# Patient Record
Sex: Male | Born: 1965 | Race: White | Hispanic: No | Marital: Married | Smoking: Former smoker
Health system: Southern US, Community
[De-identification: ages and names within clinical notes are randomized; demographics above are authoritative.]

## PROBLEM LIST (undated history)

## (undated) DIAGNOSIS — C801 Malignant (primary) neoplasm, unspecified: Secondary | ICD-10-CM

## (undated) DIAGNOSIS — E785 Hyperlipidemia, unspecified: Secondary | ICD-10-CM

## (undated) HISTORY — DX: Hyperlipidemia, unspecified: E78.5

## (undated) HISTORY — PX: HERNIA REPAIR: SHX51

---

## 1998-09-12 ENCOUNTER — Ambulatory Visit (HOSPITAL_COMMUNITY): Admission: RE | Admit: 1998-09-12 | Discharge: 1998-09-12 | Payer: Self-pay | Admitting: *Deleted

## 2015-03-09 ENCOUNTER — Other Ambulatory Visit: Payer: Self-pay | Admitting: General Surgery

## 2015-03-09 DIAGNOSIS — R1900 Intra-abdominal and pelvic swelling, mass and lump, unspecified site: Secondary | ICD-10-CM

## 2015-03-13 ENCOUNTER — Ambulatory Visit
Admission: RE | Admit: 2015-03-13 | Discharge: 2015-03-13 | Disposition: A | Discharge status: Home / Self Care | Payer: BLUE CROSS/BLUE SHIELD | Source: Ambulatory Visit | Attending: General Surgery | Admitting: General Surgery

## 2015-03-13 IMAGING — CT CT ABD-PELV W/ CM
3 of 5 series · 13 of 36 positions shown, 19 images · IV contrast (READICAT/WATER & [ID] ISOVUE 300)
Comparison: None.

CLINICAL DATA: Left-sided abdominal bulge with discomfort. Status
post previous right inguinal hernia repair.

EXAM:
CT ABDOMEN AND PELVIS WITH CONTRAST
TECHNIQUE: Multidetector CT imaging of the abdomen and pelvis was performed
using the standard protocol following bolus administration of
intravenous contrast.
CONTRAST:  100mL [R2] IOPAMIDOL ([R2]) INJECTION 61%

[Series 3: abd/pelvis with · axial · 0.86mm/px · z∈[-423,-63]mm · 7 of 96 slices shown, 12 images]
[im 12/96  soft-tissue]
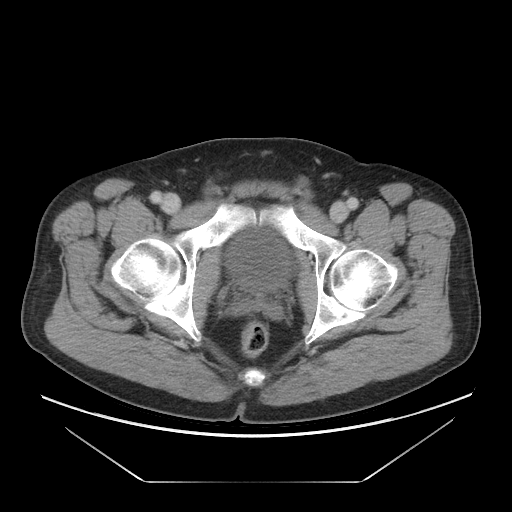
[im 12/96  bone]
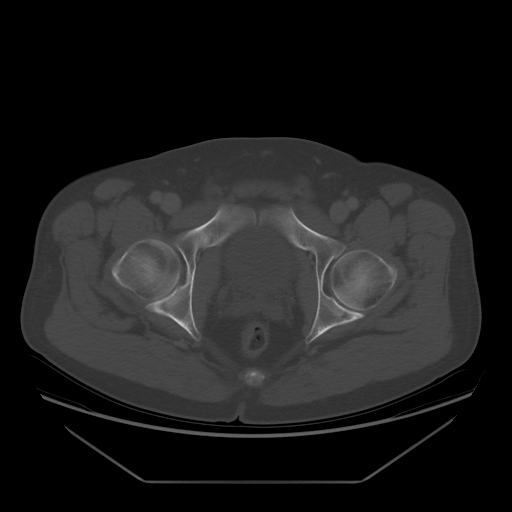
[im 24/96  soft-tissue]
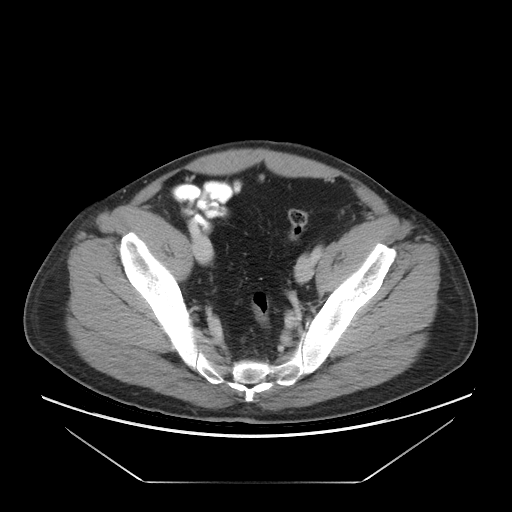
[im 36/96  soft-tissue]
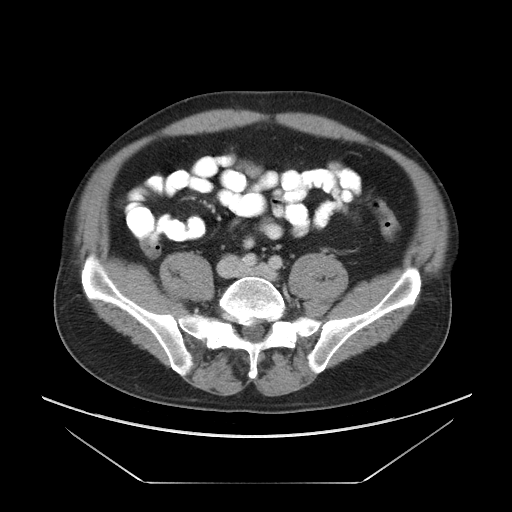
[im 48/96  soft-tissue]
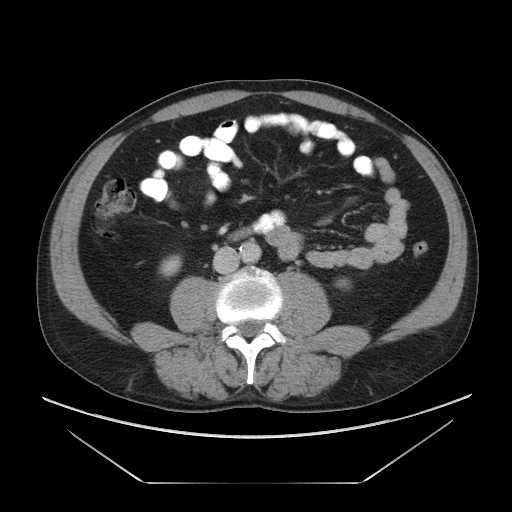
[im 48/96  lung]
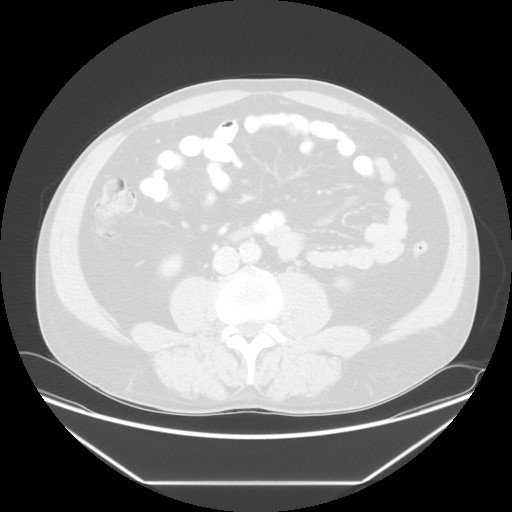
[im 60/96  soft-tissue]
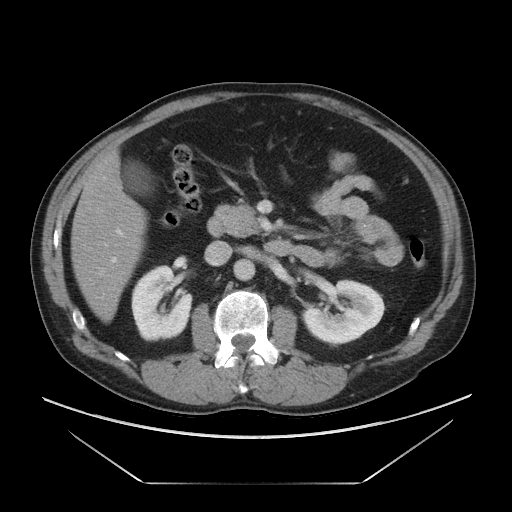
[im 60/96  lung]
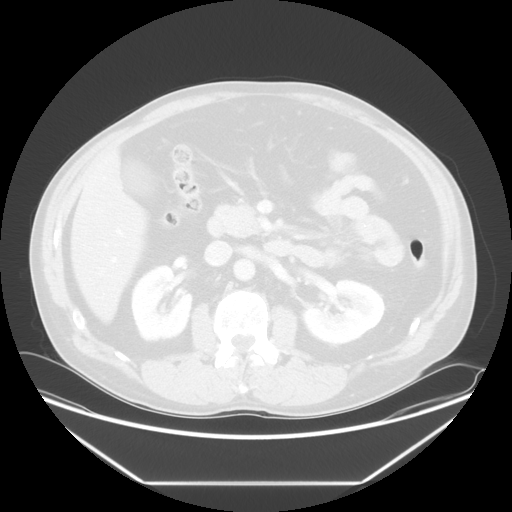
[im 72/96  soft-tissue]
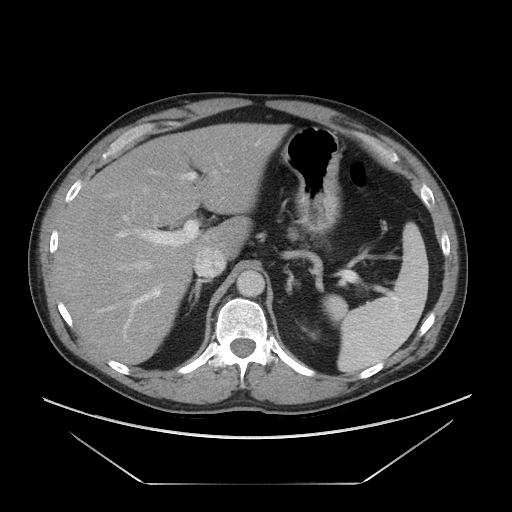
[im 72/96  lung]
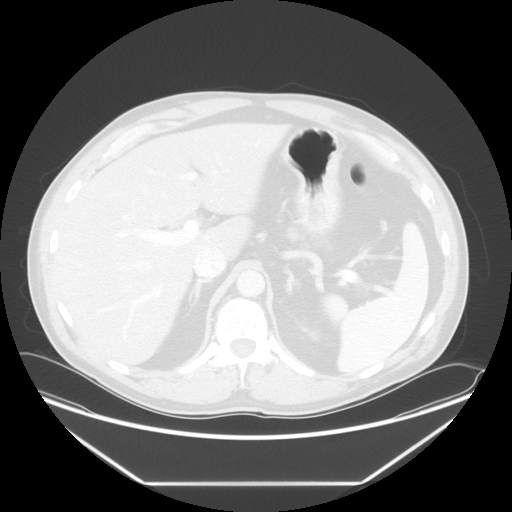
[im 84/96  soft-tissue]
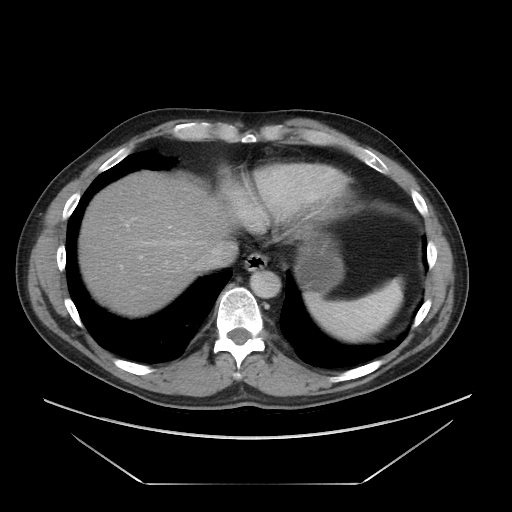
[im 84/96  lung]
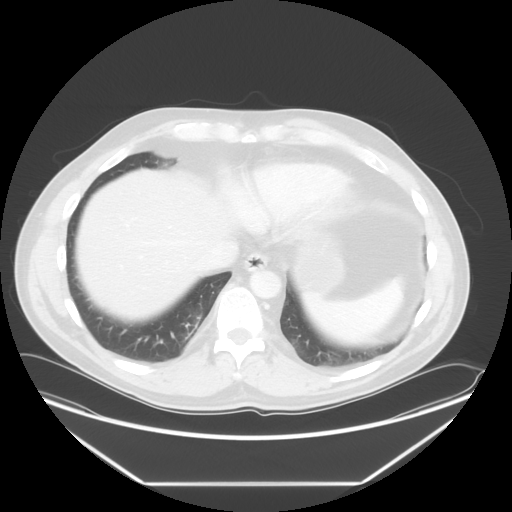

[Series 601: coronal body · coronal · 0.95mm/px · 1 of 122 slices shown, 2 images]
[im 41/122  soft-tissue]
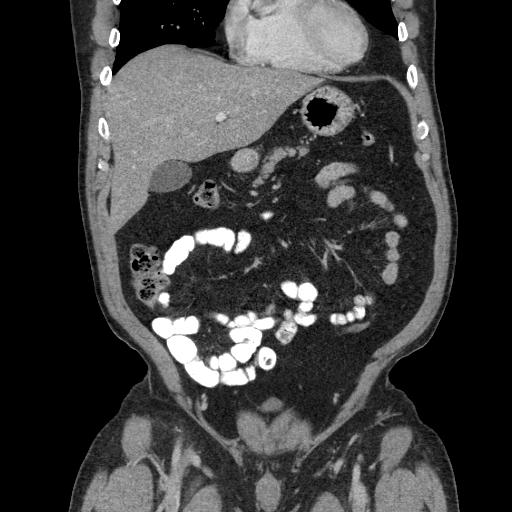
[im 41/122  bone]
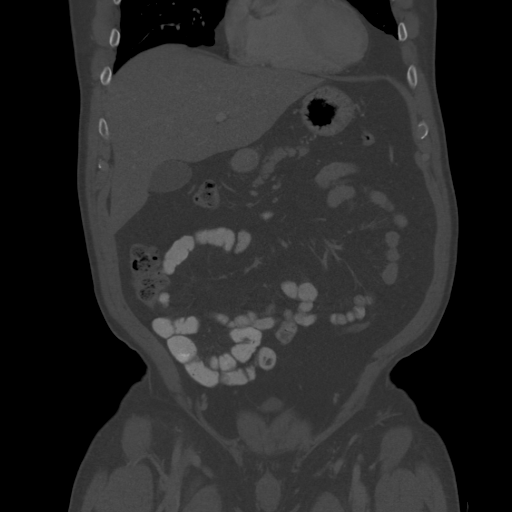

[Series 602: sagittal body · sagittal · 0.95mm/px · 5 of 166 slices shown]
[im 11/166  soft-tissue]
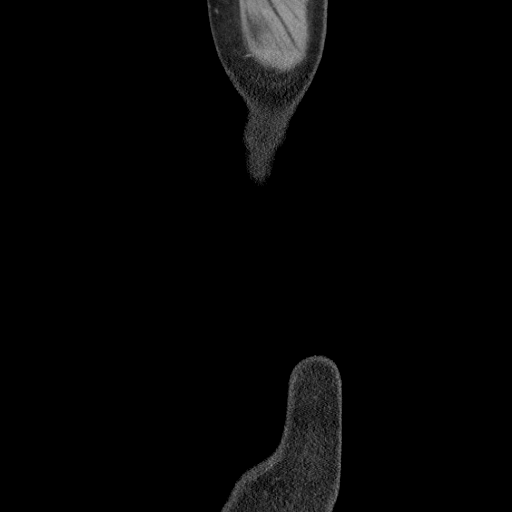
[im 31/166  soft-tissue]
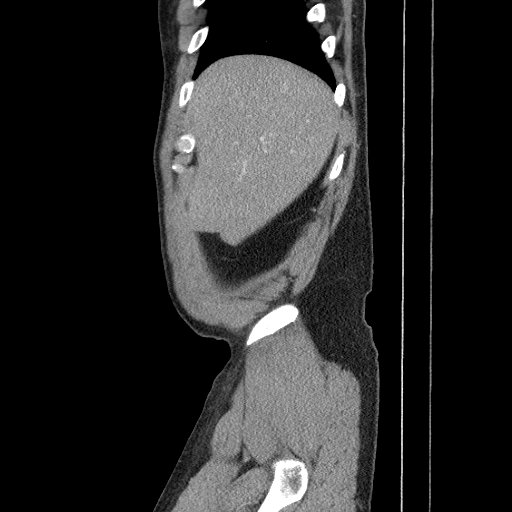
[im 52/166  soft-tissue]
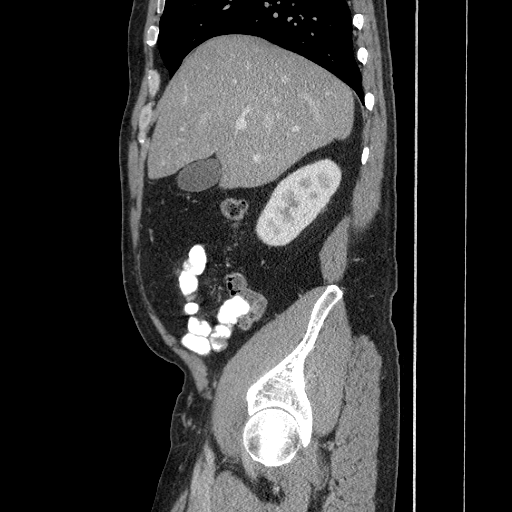
[im 73/166  soft-tissue]
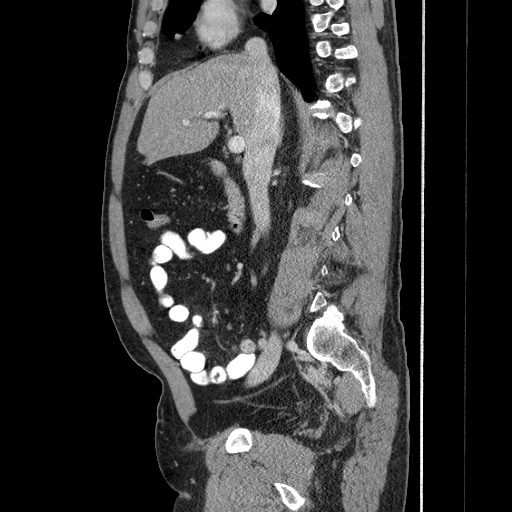
[im 93/166  soft-tissue]
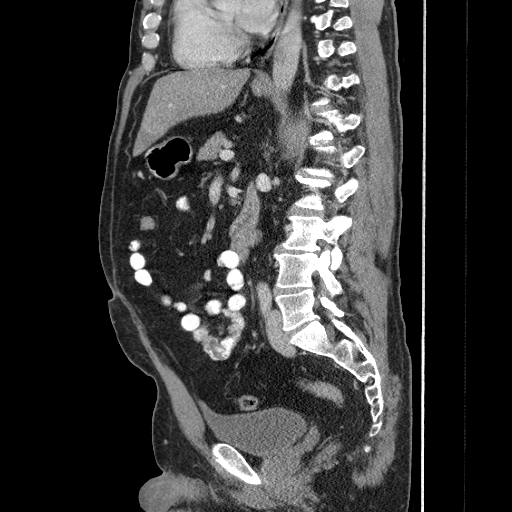

[13 of 36 positions shown; findings below may reference images not displayed]

FINDINGS: The region of abdominal bulge was marked by a vitamin E capsule by
the CT technologist. In this region, there is no evidence of mass,
hernia or inflammation. No abnormal fluid collections are
identified.

The liver shows mild and diffuse steatosis. The gallbladder,
pancreas, spleen, adrenal glands and kidneys are within normal
limits. No lymphadenopathy or focal masses are identified.

Bowel shows no evidence of obstruction or inflammation. The appendix
is well visualized and normal. No inguinal hernias. The bladder is
unremarkable. Visualized lung bases are normal. Bony structures show
moderate spondylosis at L1-2 and L4-5. Mild spondylosis present at
L3-4 L5-S1. No bony lesions or subluxations.
IMPRESSION: 1. No evidence of at abnormality by CT in the region of symptomatic
left abdominal bulge.
2. Mild hepatic steatosis.

## 2015-03-13 MED ORDER — IOPAMIDOL (ISOVUE-300) INJECTION 61%
100.0000 mL | Freq: Once | INTRAVENOUS | Status: AC | PRN
  Administered 2015-03-13: 100 mL via INTRAVENOUS

## 2016-09-14 ENCOUNTER — Ambulatory Visit (HOSPITAL_COMMUNITY)
Admission: EM | Admit: 2016-09-14 | Discharge: 2016-09-14 | Disposition: A | Discharge status: Home / Self Care | Payer: 59 | Attending: Emergency Medicine | Admitting: Emergency Medicine

## 2016-09-14 ENCOUNTER — Encounter (HOSPITAL_COMMUNITY): Payer: Self-pay | Admitting: Emergency Medicine

## 2016-09-14 DIAGNOSIS — B029 Zoster without complications: Secondary | ICD-10-CM | POA: Diagnosis not present

## 2016-09-14 MED ORDER — TRIAMCINOLONE ACETONIDE 0.1 % EX CREA: 1.0000 "application " | TOPICAL_CREAM | Freq: Two times a day (BID) | CUTANEOUS | 0 refills | Status: DC

## 2016-09-14 MED ORDER — VALACYCLOVIR HCL 1 G PO TABS: 1000.0000 mg | ORAL_TABLET | Freq: Three times a day (TID) | ORAL | 0 refills | Status: AC

## 2016-09-14 NOTE — ED Provider Notes (Signed)
[place]    CSN: [id number] Arrival date & time: 09/14/16  1759     History   Chief Complaint Chief Complaint  Patient presents with  . Rash  . URI    HPI Sean [name] is a 50 y.o. male.   HPI He is a 50 year old man here for evaluation of rash. He states symptoms started about 10-14 days ago with a sharp shooting pain in the left back and side. Over the last week, he has developed a rash in that distribution. It is painful. He also reports a few spots of poison ivy on his lower legs. He states they have dogs that sleep in the bed with them. No fevers.  History reviewed. No pertinent past medical history.  There are no active problems to display for this patient.   History reviewed. No pertinent surgical history.     Home Medications    Prior to Admission medications   Medication Sig Start Date End Date Taking? Authorizing Provider  triamcinolone cream (KENALOG) 0.1 % Apply 1 application topically 2 (two) times daily. 09/14/16   Melony Overly, MD  valACYclovir (VALTREX) 1000 MG tablet Take 1 tablet (1,000 mg total) by mouth 3 (three) times daily. 09/14/16 09/28/16  Melony Overly, MD    Family History History reviewed. No pertinent family history.  Social History Social History  Substance Use Topics  . Smoking status: Never Smoker  . Smokeless tobacco: Never Used  . Alcohol use Yes     Allergies   Patient has no known allergies.   Review of Systems Review of Systems As in history of present illness  Physical Exam Triage Vital Signs ED Triage Vitals  Enc Vitals Group     BP 09/14/16 1953 145/81     Pulse Rate 09/14/16 1953 90     Resp 09/14/16 1953 16     Temp 09/14/16 1953 97.5 F (36.4 C)     Temp Source 09/14/16 1953 Oral     SpO2 09/14/16 1953 97 %     Weight --      Height --      Head Circumference --      Peak Flow --      Pain Score 09/14/16 1951 5     Pain Loc --      Pain Edu? --      Excl. in [place]? --    No data  found.   Updated Vital Signs BP 145/81 (BP Location: Left Arm)   Pulse 90   Temp 97.5 F (36.4 C) (Oral)   Resp 16   SpO2 97%   Visual Acuity Right Eye Distance:   Left Eye Distance:   Bilateral Distance:    Right Eye Near:   Left Eye Near:    Bilateral Near:     Physical Exam  Constitutional: He is oriented to person, place, and time. He appears well-developed and well-nourished. No distress.  Cardiovascular: Normal rate.   Pulmonary/Chest: Effort normal.  Neurological: He is alert and oriented to person, place, and time.  Skin: Rash (papulovesicular rash on erythematous base in a dermatomal distribution on the left back and abdomen.) noted.     UC Treatments / Results  Labs (all labs ordered are listed, but only abnormal results are displayed) Labs Reviewed - No data to display  EKG  EKG Interpretation None       Radiology No results found.  Procedures Procedures (including critical care time)  Medications Ordered in UC  Medications - No data to display   Initial Impression / Assessment and Plan / UC Course  I have reviewed the triage vital signs and the nursing notes.  Pertinent labs & imaging results that were available during my care of the patient were reviewed by me and considered in my medical decision making (see chart for details).  Clinical Course     Treat with Valtrex and triamcinolone. Follow-up with PCP in 1-2 weeks for recheck.  Final Clinical Impressions(s) / UC Diagnoses   Final diagnoses:  Herpes zoster without complication    New Prescriptions Discharge Medication List as of 09/14/2016  8:43 PM    START taking these medications   Details  triamcinolone cream (KENALOG) 0.1 % Apply 1 application topically 2 (two) times daily., Starting Sat 09/14/2016, Normal    valACYclovir (VALTREX) 1000 MG tablet Take 1 tablet (1,000 mg total) by mouth 3 (three) times daily., Starting Sat 09/14/2016, Until Sat 09/28/2016, Normal           Melony Overly, MD 09/14/16 2053

## 2016-09-14 NOTE — ED Triage Notes (Signed)
Here for rash/shingles on left side/flank onset 1 week associated w/pain  Also c/o cold sx onset x3 days associated w/cough, congestion runny nose  Denies fevers  A&O x4... NAD

## 2016-09-14 NOTE — Discharge Instructions (Signed)
You have shingles. Take Valtrex 3 times a day for 1 week. Use the triamcinolone cream twice a day as needed for pain or itching. Follow-up with your PCP in 1-2 weeks for a recheck.

## 2016-09-27 DIAGNOSIS — L309 Dermatitis, unspecified: Secondary | ICD-10-CM | POA: Diagnosis not present

## 2016-09-27 DIAGNOSIS — Z0001 Encounter for general adult medical examination with abnormal findings: Secondary | ICD-10-CM | POA: Diagnosis not present

## 2016-09-27 DIAGNOSIS — Z8619 Personal history of other infectious and parasitic diseases: Secondary | ICD-10-CM | POA: Diagnosis not present

## 2016-09-27 DIAGNOSIS — Z1212 Encounter for screening for malignant neoplasm of rectum: Secondary | ICD-10-CM | POA: Diagnosis not present

## 2016-10-07 ENCOUNTER — Encounter: Payer: Self-pay | Admitting: Gastroenterology

## 2016-10-09 DIAGNOSIS — L814 Other melanin hyperpigmentation: Secondary | ICD-10-CM | POA: Diagnosis not present

## 2016-10-09 DIAGNOSIS — L821 Other seborrheic keratosis: Secondary | ICD-10-CM | POA: Diagnosis not present

## 2016-10-09 DIAGNOSIS — L400 Psoriasis vulgaris: Secondary | ICD-10-CM | POA: Diagnosis not present

## 2016-10-21 DIAGNOSIS — H04123 Dry eye syndrome of bilateral lacrimal glands: Secondary | ICD-10-CM | POA: Diagnosis not present

## 2016-10-21 DIAGNOSIS — H40033 Anatomical narrow angle, bilateral: Secondary | ICD-10-CM | POA: Diagnosis not present

## 2016-11-12 ENCOUNTER — Ambulatory Visit (INDEPENDENT_AMBULATORY_CARE_PROVIDER_SITE_OTHER): Payer: 59 | Admitting: Gastroenterology

## 2016-11-12 ENCOUNTER — Encounter: Payer: Self-pay | Admitting: Gastroenterology

## 2016-11-12 ENCOUNTER — Other Ambulatory Visit: Payer: Self-pay

## 2016-11-12 VITALS — BP 120/80 | HR 80 | Ht 70.08 in | Wt 224.2 lb

## 2016-11-12 DIAGNOSIS — Z1211 Encounter for screening for malignant neoplasm of colon: Secondary | ICD-10-CM | POA: Diagnosis not present

## 2016-11-12 DIAGNOSIS — R7989 Other specified abnormal findings of blood chemistry: Secondary | ICD-10-CM | POA: Diagnosis not present

## 2016-11-12 DIAGNOSIS — R945 Abnormal results of liver function studies: Secondary | ICD-10-CM

## 2016-11-12 MED ORDER — NA SULFATE-K SULFATE-MG SULF 17.5-3.13-1.6 GM/177ML PO SOLN: 1.0000 | Freq: Once | ORAL | 0 refills | Status: AC

## 2016-11-12 NOTE — Patient Instructions (Signed)
If you are age 53 or older, your body mass index should be between 23-30. Your Body mass index is 32.1 kg/m. If this is out of the aforementioned range listed, please consider follow up with your Primary Care Provider.  If you are age 83 or younger, your body mass index should be between 19-25. Your Body mass index is 32.1 kg/m. If this is out of the aformentioned range listed, please consider follow up with your Primary Care Provider.   You have been scheduled for a colonoscopy. Please follow written instructions given to you at your visit today.  Please pick up your prep supplies at the pharmacy within the next 1-3 days. If you use inhalers (even only as needed), please bring them with you on the day of your procedure. Your physician has requested that you go to www.startemmi.com and enter the access code given to you at your visit today. This web site gives a general overview about your procedure. However, you should still follow specific instructions given to you by our office regarding your preparation for the procedure.  Thank you for choosing [facility]  Dr Wilfrid Lund III

## 2016-11-12 NOTE — Progress Notes (Signed)
Sean [name] 11/12/2016  Referring physician: Horatio Pel, MD  Reason for consult/chief complaint: Elevated Hepatic Enzymes (pt states no complaints.  No past history of colonoscopy or endoscopy. )   Subjective  HPI:  Referred by DrPharr for elevated LFTs, which had occurred about a year ago.  Sounds like it may have been during an acute illness (?shingles) He also says he stopped alcohol at that point, but did not feel that it had been heavy use anyway.  No prior colonoscopy.  No abd pain, no nausea/vomiting, no rectal bleeding   ROS:  Review of Systems  Constitutional: Negative for appetite change and unexpected weight change.  HENT: Negative for mouth sores and voice change.   Eyes: Negative for pain and redness.  Respiratory: Negative for cough and shortness of breath.   Cardiovascular: Negative for chest pain and palpitations.  Genitourinary: Negative for dysuria and hematuria.  Musculoskeletal: Negative for arthralgias and myalgias.  Skin: Positive for rash. Negative for pallor.       Still resolving left flank rash from recent shingles flare  Neurological: Negative for weakness and headaches.  Hematological: Negative for adenopathy.     Past Medical History: History reviewed. No pertinent past medical history.  No chronic medical problems  Past Surgical History: Past Surgical History:  Procedure Laterality Date  . HERNIA REPAIR       Family History: Family History  Problem Relation Age of Onset  . Osteoarthritis Mother   . Hypertension Father   . Colon polyps Father   . Colon polyps Brother   . Colon polyps Brother   . Colon cancer Neg Hx   . Stomach cancer Neg Hx   . Rectal cancer Neg Hx   . Esophageal cancer Neg Hx   . Liver cancer Neg Hx     Social History: Social History   Social History  . Marital status: Married    Spouse name: N/A  . Number of children: 0  . Years of  education: N/A   Occupational History  . inspector [employer]   Social History Main Topics  . Smoking status: Former Research scientist (life sciences)  . Smokeless tobacco: Never Used  . Alcohol use Yes     Comment: 2-3 glasses of wine a day  . Drug use: No  . Sexual activity: Not Asked   Other Topics Concern  . None   Social History Narrative  . None    Allergies: No Known Allergies  Outpatient Meds: Current Outpatient Prescriptions  Medication Sig Dispense Refill  . triamcinolone cream (KENALOG) 0.1 % Apply 1 application topically 2 (two) times daily. (Patient taking differently: Apply 1 application topically as needed. ) 30 g 0   No current facility-administered medications for this visit.       ___________________________________________________________________ Objective   Exam:  BP 120/80   Pulse 80 Comment: irregular  Ht 5' 10.08" (1.78 m)   Wt 224 lb 4 oz (101.7 kg)   BMI 32.10 kg/m    General: this is a(n) well appearing middle aged man   Eyes: sclera anicteric, no redness  ENT: oral mucosa moist without lesions, no cervical or supraclavicular lymphadenopathy, good dentition  CV: RRR without murmur, S1/S2, no JVD, no peripheral edema  Resp: clear to auscultation bilaterally, normal RR and effort noted  GI: soft, no tenderness, with active bowel sounds. No guarding or palpable organomegaly noted.  Skin; warm and dry, no rash or jaundice noted  Neuro: awake, alert and  oriented x 3. Normal gross motor function and fluent speech  Labs: Recent LFTs normal  TG > 600, T chol 230   Assessment: Encounter Diagnoses  Name Primary?  . Special screening for malignant neoplasms, colon Yes  . LFT elevation     LFTs have now normalized, unclear cause, no further workup needed now.  Plan:  Scheduled screening colonoscopy.  Strongly advised to contact referring doc re: dyslipidemia - looks like it needs statin.  Thank you for the courtesy of this consult.  Please  call me with any questions or concerns.  Nelida Meuse III  CC: Horatio Pel, MD

## 2016-11-14 ENCOUNTER — Encounter: Payer: Self-pay | Admitting: Gastroenterology

## 2016-11-27 ENCOUNTER — Telehealth: Payer: Self-pay | Admitting: Gastroenterology

## 2016-11-28 ENCOUNTER — Encounter: Payer: Self-pay | Admitting: Gastroenterology

## 2016-11-28 ENCOUNTER — Ambulatory Visit (AMBULATORY_SURGERY_CENTER): Payer: 59 | Admitting: Gastroenterology

## 2016-11-28 VITALS — BP 141/93 | HR 71 | Temp 98.0°F | Resp 16 | Ht 70.5 in | Wt 224.0 lb

## 2016-11-28 DIAGNOSIS — Z1212 Encounter for screening for malignant neoplasm of rectum: Secondary | ICD-10-CM | POA: Diagnosis not present

## 2016-11-28 DIAGNOSIS — Z1211 Encounter for screening for malignant neoplasm of colon: Secondary | ICD-10-CM | POA: Diagnosis not present

## 2016-11-28 DIAGNOSIS — K621 Rectal polyp: Secondary | ICD-10-CM

## 2016-11-28 DIAGNOSIS — D128 Benign neoplasm of rectum: Secondary | ICD-10-CM

## 2016-11-28 MED ORDER — SODIUM CHLORIDE 0.9 % IV SOLN: 500.0000 mL | INTRAVENOUS | Status: DC

## 2016-11-28 NOTE — Patient Instructions (Signed)
YOU HAD AN ENDOSCOPIC PROCEDURE TODAY AT [facility]:   Refer to the procedure report that was given to you for any specific questions about what was found during the examination.  If the procedure report does not answer your questions, please call your gastroenterologist to clarify.  If you requested that your care partner not be given the details of your procedure findings, then the procedure report has been included in a sealed envelope for you to review at your convenience later.  YOU SHOULD EXPECT: Some feelings of bloating in the abdomen. Passage of more gas than usual.  Walking can help get rid of the air that was put into your GI tract during the procedure and reduce the bloating. If you had a lower endoscopy (such as a colonoscopy or flexible sigmoidoscopy) you may notice spotting of blood in your stool or on the toilet paper. If you underwent a bowel prep for your procedure, you may not have a normal bowel movement for a few days.  Please Note:  You might notice some irritation and congestion in your nose or some drainage.  This is from the oxygen used during your procedure.  There is no need for concern and it should clear up in a day or so.  SYMPTOMS TO REPORT IMMEDIATELY:   Following lower endoscopy (colonoscopy or flexible sigmoidoscopy):  Excessive amounts of blood in the stool  Significant tenderness or worsening of abdominal pains  Swelling of the abdomen that is new, acute  Fever of 100F or higher  For urgent or emergent issues, a gastroenterologist can be reached at any hour by calling [phone].   DIET:  We do recommend a small meal at first, but then you may proceed to your regular diet.  Drink plenty of fluids but you should avoid alcoholic beverages for 24 hours.  ACTIVITY:  You should plan to take it easy for the rest of today and you should NOT DRIVE or use heavy machinery until tomorrow (because of the sedation medicines used during the test).     FOLLOW UP: Our staff will call the number listed on your records the next business day following your procedure to check on you and address any questions or concerns that you may have regarding the information given to you following your procedure. If we do not reach you, we will leave a message.  However, if you are feeling well and you are not experiencing any problems, there is no need to return our call.  We will assume that you have returned to your regular daily activities without incident.  If any biopsies were taken you will be contacted by phone or by letter within the next 1-3 weeks.  Please call us at [phone] if you have not heard about the biopsies in 3 weeks.    SIGNATURES/CONFIDENTIALITY: You and/or your care partner have signed paperwork which will be entered into your electronic medical record.  These signatures attest to the fact that that the information above on your After Visit Summary has been reviewed and is understood.  Full responsibility of the confidentiality of this discharge information lies with you and/or your care-partner.  Await pathology  Please read over handout about polyps  Continue your normal medications

## 2016-11-28 NOTE — Progress Notes (Signed)
Called to room to assist during endoscopic procedure.  Patient ID and intended procedure confirmed with present staff. Received instructions for my participation in the procedure from the performing physician.  

## 2016-11-28 NOTE — Progress Notes (Signed)
To recovery, report to [place], RN, VSS 

## 2016-11-28 NOTE — Op Note (Signed)
[facility] Patient Name: Sean [name] Procedure Date: 11/28/2016 3:02 PM MRN: [id number] Endoscopist: Mallie Mussel L. Loletha Carrow , MD Age: 51 Referring MD:  Date of Birth: 23-Nov-1965 Gender: Male Account #: [id number] Procedure:                Colonoscopy Indications:              Screening for colorectal malignant neoplasm, This                            is the patient's first colonoscopy Medicines:                Monitored Anesthesia Care Procedure:                Pre-Anesthesia Assessment:                           - Prior to the procedure, a History and Physical                            was performed, and patient medications and                            allergies were reviewed. The patient's tolerance of                            previous anesthesia was also reviewed. The risks                            and benefits of the procedure and the sedation                            options and risks were discussed with the patient.                            All questions were answered, and informed consent                            was obtained. Prior Anticoagulants: The patient has                            taken no previous anticoagulant or antiplatelet                            agents. ASA Grade Assessment: II - A patient with                            mild systemic disease. After reviewing the risks                            and benefits, the patient was deemed in                            satisfactory condition to undergo the procedure.  After obtaining informed consent, the colonoscope                            was passed under direct vision. Throughout the                            procedure, the patient's blood pressure, pulse, and                            oxygen saturations were monitored continuously. The                            Colonoscope was introduced through the anus and                            advanced to the the cecum,  identified by                            appendiceal orifice and ileocecal valve. The                            colonoscopy was performed without difficulty. The                            patient tolerated the procedure well. The quality                            of the bowel preparation was good. The ileocecal                            valve, appendiceal orifice, and rectum were                            photographed. The bowel preparation used was SUPREP. Scope In: 3:11:26 PM Scope Out: 3:24:11 PM Scope Withdrawal Time: 0 hours 10 minutes 24 seconds  Total Procedure Duration: 0 hours 12 minutes 45 seconds  Findings:                 The perianal and digital rectal examinations were                            normal.                           A 2 mm polyp was found in the rectum. The polyp was                            sessile. The polyp was removed with a cold biopsy                            forceps. Resection and retrieval were complete.                           The exam was otherwise without abnormality on  direct and retroflexion views. Complications:            No immediate complications. Estimated Blood Loss:     Estimated blood loss: none. Impression:               - One 2 mm polyp in the rectum, removed with a cold                            biopsy forceps. Resected and retrieved.                           - The examination was otherwise normal on direct                            and retroflexion views. Recommendation:           - Patient has a contact number available for                            emergencies. The signs and symptoms of potential                            delayed complications were discussed with the                            patient. Return to normal activities tomorrow.                            Written discharge instructions were provided to the                            patient.                           - Resume  previous diet.                           - Continue present medications.                           - Await pathology results.                           - Repeat colonoscopy is recommended for                            surveillance. The colonoscopy date will be                            determined after pathology results from today's                            exam become available for review. Cherlynn Popiel L. Loletha Carrow, MD 11/28/2016 3:27:26 PM This report has been signed electronically.

## 2016-11-29 ENCOUNTER — Telehealth: Payer: Self-pay

## 2016-11-29 NOTE — Telephone Encounter (Signed)
Left message on answering machine. 

## 2016-12-02 ENCOUNTER — Telehealth: Payer: Self-pay | Admitting: *Deleted

## 2016-12-02 NOTE — Telephone Encounter (Signed)
No answer for second call back followup. SM

## 2016-12-03 ENCOUNTER — Encounter: Payer: Self-pay | Admitting: Gastroenterology

## 2016-12-03 DIAGNOSIS — E781 Pure hyperglyceridemia: Secondary | ICD-10-CM | POA: Diagnosis not present

## 2017-01-13 DIAGNOSIS — E781 Pure hyperglyceridemia: Secondary | ICD-10-CM | POA: Diagnosis not present

## 2017-01-14 DIAGNOSIS — E781 Pure hyperglyceridemia: Secondary | ICD-10-CM | POA: Diagnosis not present

## 2017-10-20 DIAGNOSIS — H40033 Anatomical narrow angle, bilateral: Secondary | ICD-10-CM | POA: Diagnosis not present

## 2017-10-20 DIAGNOSIS — H04123 Dry eye syndrome of bilateral lacrimal glands: Secondary | ICD-10-CM | POA: Diagnosis not present

## 2018-01-27 DIAGNOSIS — Z23 Encounter for immunization: Secondary | ICD-10-CM | POA: Diagnosis not present

## 2018-05-01 DIAGNOSIS — Z23 Encounter for immunization: Secondary | ICD-10-CM | POA: Diagnosis not present

## 2019-11-21 ENCOUNTER — Ambulatory Visit: Payer: 59 | Attending: Internal Medicine

## 2019-11-21 DIAGNOSIS — Z23 Encounter for immunization: Secondary | ICD-10-CM | POA: Insufficient documentation

## 2019-11-21 NOTE — Progress Notes (Signed)
   Covid-19 Vaccination Clinic  Name:  Arlie Brendel    MRN: [id number] DOB: [birth year]  11/21/2019  Mr. Wenig was observed post Covid-19 immunization for 15 minutes without incident. He was provided with Vaccine Information Sheet and instruction to access the V-Safe system.   Mr. Nidiffer was instructed to call 911 with any severe reactions post vaccine: Marland Kitchen Difficulty breathing  . Swelling of face and throat  . A fast heartbeat  . A bad rash all over body  . Dizziness and weakness   Immunizations Administered    Name Date Dose VIS Date Route   Pfizer COVID-19 Vaccine 11/21/2019  5:09 PM 0.3 mL 08/27/2019 Intramuscular   Manufacturer: [place]   Lot: [id number]   [place]: [id number]

## 2019-12-22 ENCOUNTER — Ambulatory Visit: Payer: 59 | Attending: Internal Medicine

## 2019-12-22 DIAGNOSIS — Z23 Encounter for immunization: Secondary | ICD-10-CM

## 2019-12-22 NOTE — Progress Notes (Signed)
   Covid-19 Vaccination Clinic  Name:  Sean [name]    MRN: [id number] DOB: 08/23/1966  12/22/2019  [name] was observed post Covid-19 immunization for 15 minutes without incident. He was provided with Vaccine Information Sheet and instruction to access the V-Safe system.   [name] was instructed to call 911 with any severe reactions post vaccine: Marland Kitchen Difficulty breathing  . Swelling of face and throat  . A fast heartbeat  . A bad rash all over body  . Dizziness and weakness   Immunizations Administered    Name Date Dose VIS Date Route   Pfizer COVID-19 Vaccine 12/22/2019  8:59 AM 0.3 mL 08/27/2019 Intramuscular   Manufacturer: [employer], [employer]   Lot: [id number]   [place]: [id number]

## 2020-09-14 ENCOUNTER — Inpatient Hospital Stay (HOSPITAL_COMMUNITY)
Admission: EM | Admit: 2020-09-14 | Discharge: 2020-09-18 | DRG: 054 | Disposition: A | Discharge status: Home / Self Care | Payer: 59 | Attending: Internal Medicine | Admitting: Internal Medicine

## 2020-09-14 ENCOUNTER — Emergency Department (HOSPITAL_COMMUNITY): Payer: 59

## 2020-09-14 ENCOUNTER — Encounter (HOSPITAL_COMMUNITY): Payer: Self-pay | Admitting: Emergency Medicine

## 2020-09-14 DIAGNOSIS — C7931 Secondary malignant neoplasm of brain: Secondary | ICD-10-CM | POA: Diagnosis present

## 2020-09-14 DIAGNOSIS — Z20822 Contact with and (suspected) exposure to covid-19: Secondary | ICD-10-CM | POA: Diagnosis present

## 2020-09-14 DIAGNOSIS — H532 Diplopia: Secondary | ICD-10-CM | POA: Diagnosis present

## 2020-09-14 DIAGNOSIS — R569 Unspecified convulsions: Secondary | ICD-10-CM | POA: Diagnosis present

## 2020-09-14 DIAGNOSIS — G939 Disorder of brain, unspecified: Secondary | ICD-10-CM

## 2020-09-14 DIAGNOSIS — R030 Elevated blood-pressure reading, without diagnosis of hypertension: Secondary | ICD-10-CM | POA: Diagnosis present

## 2020-09-14 DIAGNOSIS — R531 Weakness: Secondary | ICD-10-CM | POA: Diagnosis present

## 2020-09-14 DIAGNOSIS — F1020 Alcohol dependence, uncomplicated: Secondary | ICD-10-CM

## 2020-09-14 DIAGNOSIS — K760 Fatty (change of) liver, not elsewhere classified: Secondary | ICD-10-CM | POA: Diagnosis present

## 2020-09-14 DIAGNOSIS — Z79899 Other long term (current) drug therapy: Secondary | ICD-10-CM

## 2020-09-14 DIAGNOSIS — R29702 NIHSS score 2: Secondary | ICD-10-CM | POA: Diagnosis present

## 2020-09-14 DIAGNOSIS — C799 Secondary malignant neoplasm of unspecified site: Secondary | ICD-10-CM | POA: Diagnosis present

## 2020-09-14 DIAGNOSIS — E785 Hyperlipidemia, unspecified: Secondary | ICD-10-CM | POA: Diagnosis present

## 2020-09-14 DIAGNOSIS — E871 Hypo-osmolality and hyponatremia: Secondary | ICD-10-CM | POA: Diagnosis present

## 2020-09-14 DIAGNOSIS — R32 Unspecified urinary incontinence: Secondary | ICD-10-CM | POA: Diagnosis present

## 2020-09-14 DIAGNOSIS — K769 Liver disease, unspecified: Secondary | ICD-10-CM

## 2020-09-14 DIAGNOSIS — R160 Hepatomegaly, not elsewhere classified: Secondary | ICD-10-CM | POA: Diagnosis present

## 2020-09-14 DIAGNOSIS — E876 Hypokalemia: Secondary | ICD-10-CM

## 2020-09-14 DIAGNOSIS — Z87891 Personal history of nicotine dependence: Secondary | ICD-10-CM

## 2020-09-14 DIAGNOSIS — M21372 Foot drop, left foot: Secondary | ICD-10-CM | POA: Diagnosis present

## 2020-09-14 DIAGNOSIS — C801 Malignant (primary) neoplasm, unspecified: Secondary | ICD-10-CM | POA: Diagnosis present

## 2020-09-14 DIAGNOSIS — R7989 Other specified abnormal findings of blood chemistry: Secondary | ICD-10-CM

## 2020-09-14 DIAGNOSIS — C7801 Secondary malignant neoplasm of right lung: Secondary | ICD-10-CM | POA: Diagnosis present

## 2020-09-14 DIAGNOSIS — G936 Cerebral edema: Secondary | ICD-10-CM | POA: Diagnosis present

## 2020-09-14 HISTORY — DX: Disorder of brain, unspecified: G93.9

## 2020-09-14 HISTORY — DX: Liver disease, unspecified: K76.9

## 2020-09-14 LAB — CBC
HCT: 49.2 % (ref 39.0–52.0)
HCT: 46.1 % (ref 39.0–52.0)
Hemoglobin: 16.6 g/dL (ref 13.0–17.0)
Hemoglobin: 16.8 g/dL (ref 13.0–17.0)
MCH: 30.9 pg (ref 26.0–34.0)
MCH: 32.5 pg (ref 26.0–34.0)
MCHC: 33.7 g/dL (ref 30.0–36.0)
MCHC: 36.4 g/dL — ABNORMAL HIGH (ref 30.0–36.0)
MCV: 91.6 fL (ref 80.0–100.0)
MCV: 89.2 fL (ref 80.0–100.0)
Platelets: 304 10*3/uL (ref 150–400)
Platelets: 325 10*3/uL (ref 150–400)
RBC: 5.37 MIL/uL (ref 4.22–5.81)
RBC: 5.17 MIL/uL (ref 4.22–5.81)
RDW: 12.1 % (ref 11.5–15.5)
RDW: 12.1 % (ref 11.5–15.5)
WBC: 15.1 10*3/uL — ABNORMAL HIGH (ref 4.0–10.5)
WBC: 18.6 10*3/uL — ABNORMAL HIGH (ref 4.0–10.5)
nRBC: 0 % (ref 0.0–0.2)
nRBC: 0 % (ref 0.0–0.2)

## 2020-09-14 LAB — I-STAT CHEM 8, ED
BUN: 17 mg/dL (ref 6–20)
Calcium, Ion: 1.09 mmol/L — ABNORMAL LOW (ref 1.15–1.40)
Chloride: 97 mmol/L — ABNORMAL LOW (ref 98–111)
Creatinine, Ser: 0.6 mg/dL — ABNORMAL LOW (ref 0.61–1.24)
Glucose, Bld: 139 mg/dL — ABNORMAL HIGH (ref 70–99)
HCT: 49 % (ref 39.0–52.0)
Hemoglobin: 16.7 g/dL (ref 13.0–17.0)
Potassium: 3.5 mmol/L (ref 3.5–5.1)
Sodium: 134 mmol/L — ABNORMAL LOW (ref 135–145)
TCO2: 21 mmol/L — ABNORMAL LOW (ref 22–32)

## 2020-09-14 LAB — PROTIME-INR
INR: 1.1 (ref 0.8–1.2)
Prothrombin Time: 13.5 seconds (ref 11.4–15.2)

## 2020-09-14 LAB — DIFFERENTIAL
Abs Immature Granulocytes: 0.17 10*3/uL — ABNORMAL HIGH (ref 0.00–0.07)
Basophils Absolute: 0 10*3/uL (ref 0.0–0.1)
Basophils Relative: 0 %
Eosinophils Absolute: 0 10*3/uL (ref 0.0–0.5)
Eosinophils Relative: 0 %
Immature Granulocytes: 1 %
Lymphocytes Relative: 5 %
Lymphs Abs: 1 10*3/uL (ref 0.7–4.0)
Monocytes Absolute: 1.6 10*3/uL — ABNORMAL HIGH (ref 0.1–1.0)
Monocytes Relative: 9 %
Neutro Abs: 15.8 10*3/uL — ABNORMAL HIGH (ref 1.7–7.7)
Neutrophils Relative %: 85 %

## 2020-09-14 LAB — COMPREHENSIVE METABOLIC PANEL
ALT: 102 U/L — ABNORMAL HIGH (ref 0–44)
AST: 64 U/L — ABNORMAL HIGH (ref 15–41)
Albumin: 4.1 g/dL (ref 3.5–5.0)
Alkaline Phosphatase: 108 U/L (ref 38–126)
Anion gap: 14 (ref 5–15)
BUN: 16 mg/dL (ref 6–20)
CO2: 20 mmol/L — ABNORMAL LOW (ref 22–32)
Calcium: 9.1 mg/dL (ref 8.9–10.3)
Chloride: 97 mmol/L — ABNORMAL LOW (ref 98–111)
Creatinine, Ser: 0.83 mg/dL (ref 0.61–1.24)
GFR, Estimated: >60 mL/min (ref >60)
Glucose, Bld: 140 mg/dL — ABNORMAL HIGH (ref 70–99)
Potassium: 3.4 mmol/L — ABNORMAL LOW (ref 3.5–5.1)
Sodium: 131 mmol/L — ABNORMAL LOW (ref 135–145)
Total Bilirubin: 1.4 mg/dL — ABNORMAL HIGH (ref 0.3–1.2)
Total Protein: 7.5 g/dL (ref 6.5–8.1)

## 2020-09-14 LAB — RESP PANEL BY RT-PCR (FLU A&B, COVID) ARPGX2
Influenza A by PCR: NEGATIVE
Influenza B by PCR: NEGATIVE
SARS Coronavirus 2 by RT PCR: NEGATIVE

## 2020-09-14 LAB — BASIC METABOLIC PANEL
Anion gap: 14 (ref 5–15)
BUN: 19 mg/dL (ref 6–20)
CO2: 20 mmol/L — ABNORMAL LOW (ref 22–32)
Calcium: 9.2 mg/dL (ref 8.9–10.3)
Chloride: 100 mmol/L (ref 98–111)
Creatinine, Ser: 0.85 mg/dL (ref 0.61–1.24)
GFR, Estimated: >60 mL/min (ref >60)
Glucose, Bld: 136 mg/dL — ABNORMAL HIGH (ref 70–99)
Potassium: 4.3 mmol/L (ref 3.5–5.1)
Sodium: 134 mmol/L — ABNORMAL LOW (ref 135–145)

## 2020-09-14 LAB — APTT: aPTT: 30 seconds (ref 24–36)

## 2020-09-14 LAB — CBG MONITORING, ED: Glucose-Capillary: 134 mg/dL — ABNORMAL HIGH (ref 70–99)

## 2020-09-14 LAB — ETHANOL: Alcohol, Ethyl (B): <10 mg/dL (ref <10)

## 2020-09-14 IMAGING — CT CT ANGIO HEAD
2 of 11 series · 6 of 34 positions shown · IV contrast (OMNI 350)
Comparison: Head CT [DATE].

CLINICAL DATA: Neuro deficit, acute, stroke suspected.

EXAM:
CT ANGIOGRAPHY HEAD AND NECK
TECHNIQUE: Multidetector CT imaging of the head and neck was performed using
the standard protocol during bolus administration of intravenous
contrast. Multiplanar CT image reconstructions and MIPs were
obtained to evaluate the vascular anatomy. Carotid stenosis
measurements (when applicable) are obtained utilizing NASCET
criteria, using the distal internal carotid diameter as the
denominator.
CONTRAST:  100mL OMNIPAQUE IOHEXOL 300 MG/ML  SOLN

[Series 5: cta neck axial · axial · 0.39mm/px · z∈[-235,-7]mm · 5 of 344 slices shown]
[im 58/344  soft-tissue]
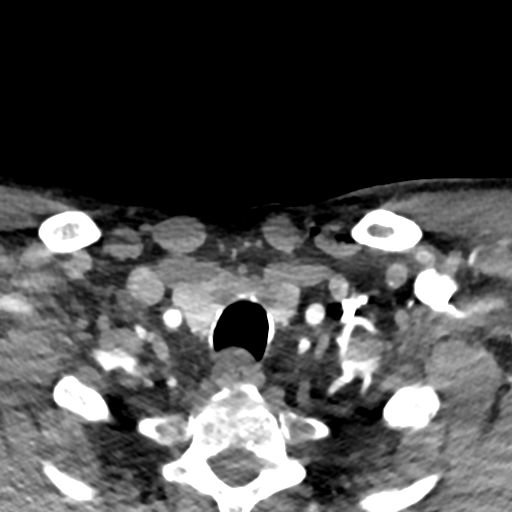
[im 115/344  bone]
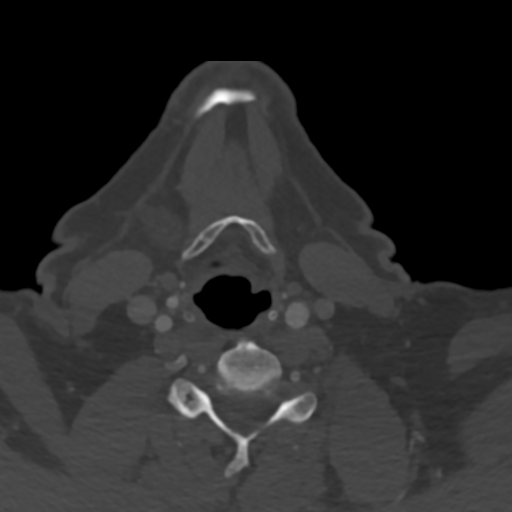
[im 172/344  soft-tissue]
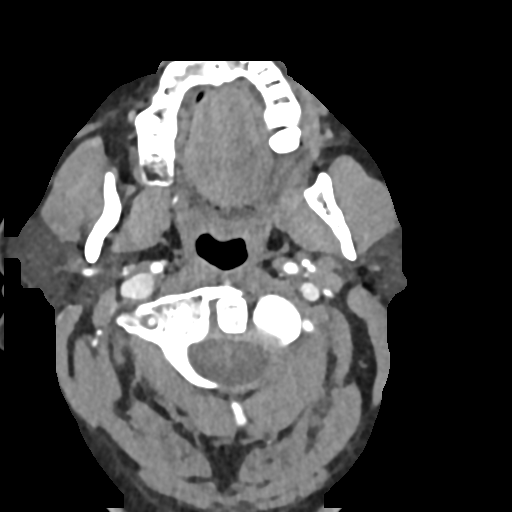
[im 229/344  bone]
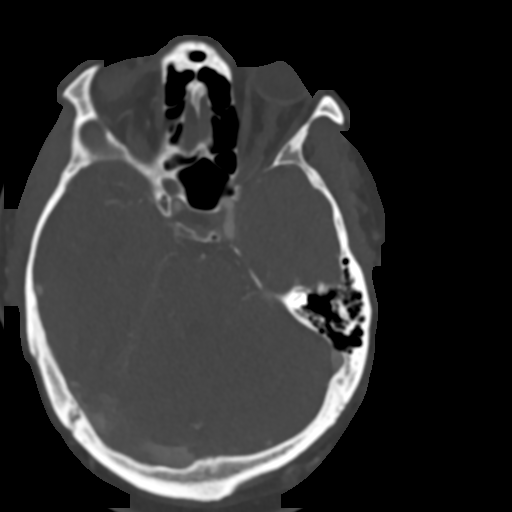
[im 286/344  soft-tissue]
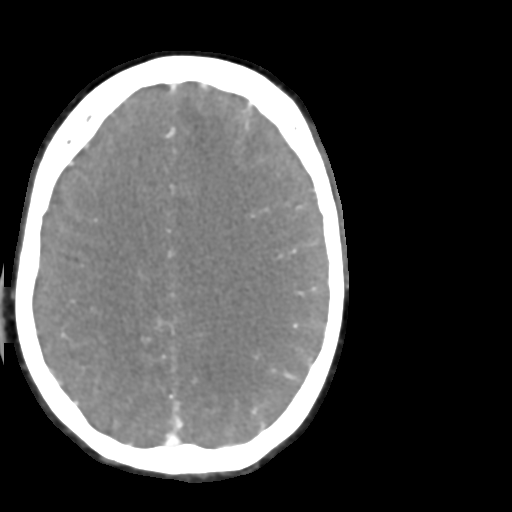

[Series 11: head without sag 5 min delay · sagittal · delayed · 0.37mm/px · 1 of 67 slices shown]
[im 27/67  soft-tissue]
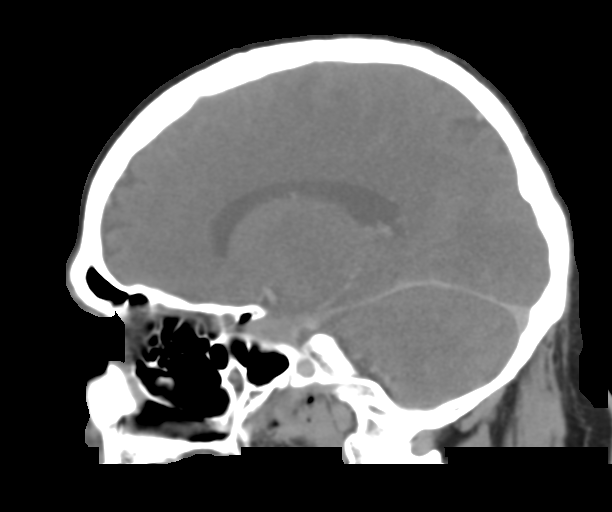

[6 of 34 positions shown; findings below may reference images not displayed]

FINDINGS: CT HEAD FINDINGS

Brain: Postcontrast images showed multiple hemorrhagic lesions
(series 12), as seen on prior CT:

- measuring 17 mm in the right frontal lobe with mild surrounding
edema, image 28;

- 6 mm in the left frontal lobe, minimal surrounding edema, image
25;

- 5 mm in the right parietal lobe, no significant edema, image 23;

- 15 mm the left parietal lobe, mild surrounding vasogenic edema,
image 23;

- 31 mm in the left frontal lobe with surrounding vasogenic edema
and effacement of the adjacent cerebral sulci, image 20;

- 27 mm in the left occipital lobe, decompressing into the occipital
horn of the left lateral ventricle with surrounding vasogenic edema
effacing the adjacent cerebral sulci, image 17.

In addition to the previously seen hemorrhagic lesions, enhancing 2
small enhancing lesions are seen in the left frontal lobe measuring
2-3 mm each (image 26) and a 6 mm enhancing lesion is seen in the
right occipital lobe (image 13).

No hydrocephalus. Evaluation for subarachnoid hemorrhage limited due
to intravenous contrast.

Vascular: See below.

Skull: Normal. Negative for fracture or focal lesion.

Sinuses: Imaged portions are clear.

Orbits: No acute finding.

Review of the MIP images confirms the above findings

CTA NECK FINDINGS

Aortic arch: Common origin of the innominate and left common carotid
arteries from the aortic arch. The left vertebral artery origin is
directly from the aortic arch. Imaged portion shows no evidence of
aneurysm or dissection. No significant stenosis of the major arch
vessel origins.

Right carotid system: No evidence of dissection, stenosis (50% or
greater) or occlusion.

Left carotid system: No evidence of dissection, stenosis (50% or
greater) or occlusion.

Vertebral arteries: The left vertebral artery origin is directly
from the aortic arch and is dominant. No evidence of dissection,
stenosis (50% or greater) or occlusion.

Skeleton: No aggressive lesion identified.

Other neck: Negative.

Upper chest: Negative.

Review of the MIP images confirms the above findings

CTA HEAD FINDINGS

Anterior circulation: No significant stenosis, proximal occlusion,
aneurysm, or vascular malformation.

Posterior circulation: No significant stenosis, proximal occlusion,
aneurysm, or vascular malformation.

Venous sinuses: As permitted by contrast timing, patent.

Review of the MIP images confirms the above findings
IMPRESSION: 1. No large vessel occlusion, hemodynamically significant stenosis,
or evidence of dissection.
2. Multiple hemorrhagic lesions are seen in the bilateral cerebral
hemispheres as described above.

## 2020-09-14 IMAGING — CT CT HEAD CODE STROKE
4 series · 15 of 47 positions shown, 17 images · non-contrast
Comparison: None.

CLINICAL DATA: Code stroke.  Acute neuro deficit.  Vomiting.

EXAM:
CT HEAD WITHOUT CONTRAST
TECHNIQUE: Contiguous axial images were obtained from the base of the skull
through the vertex without intravenous contrast.

[Series 3: head 5.0 st · axial · 0.45mm/px · z∈[-119,+1]mm · 7 of 34 slices shown, 9 images]
[im 5/34  brain]
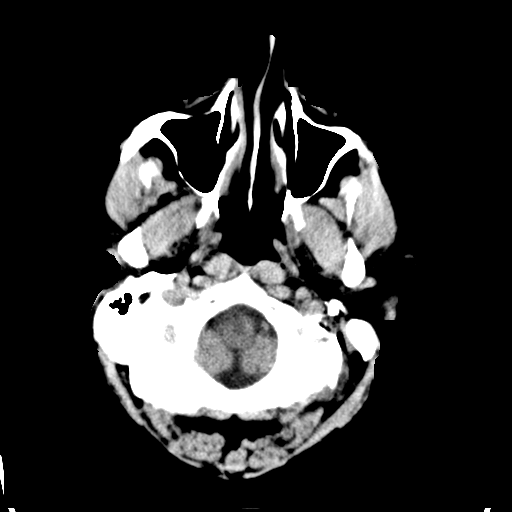
[im 5/34  bone]
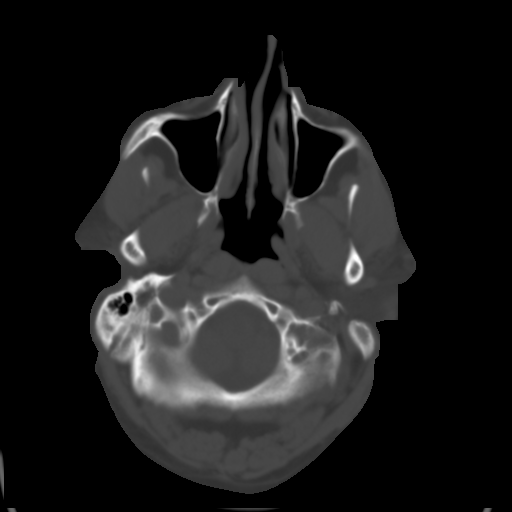
[im 9/34  brain]
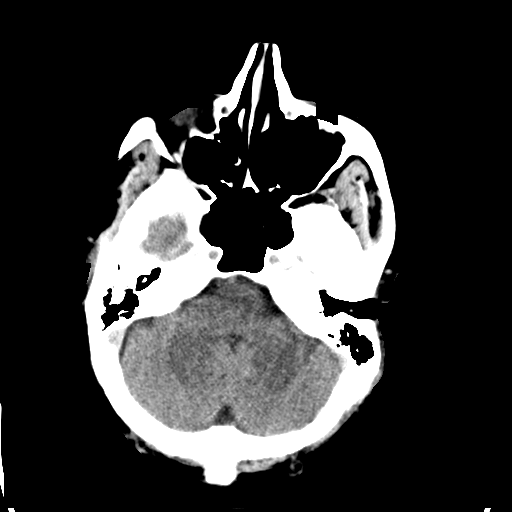
[im 13/34  brain]
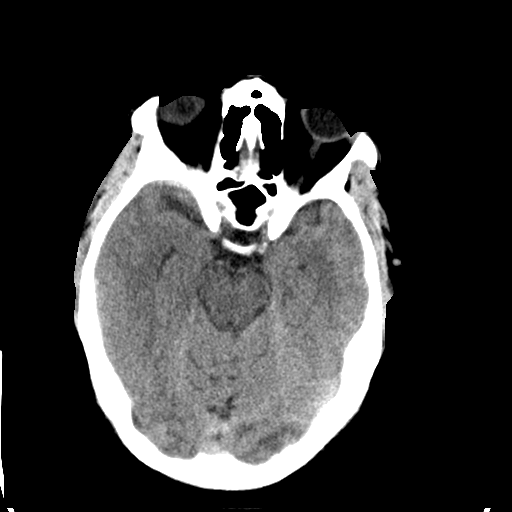
[im 17/34  brain]
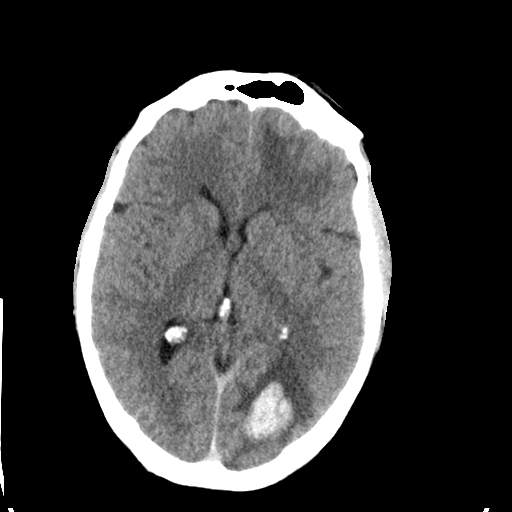
[im 21/34  brain]
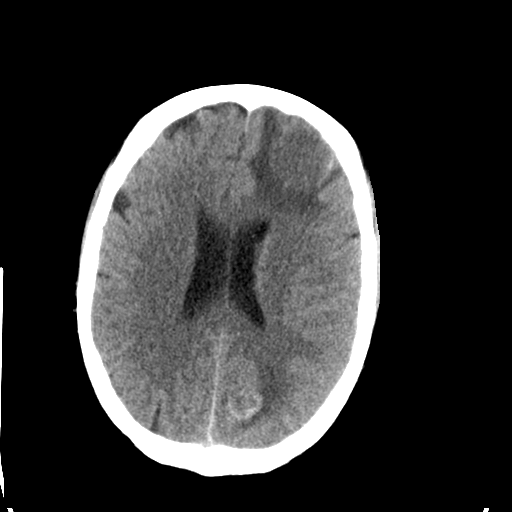
[im 21/34  bone]
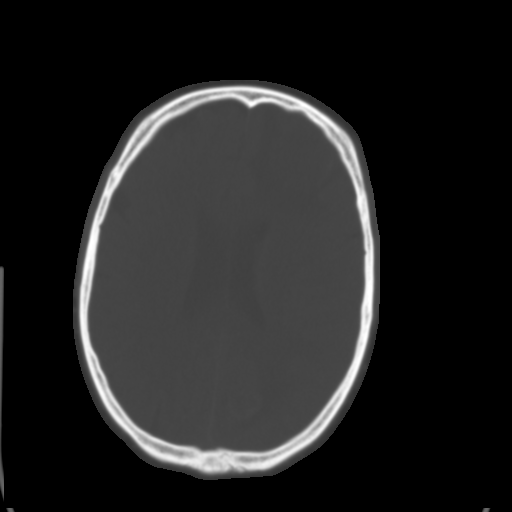
[im 25/34  brain]
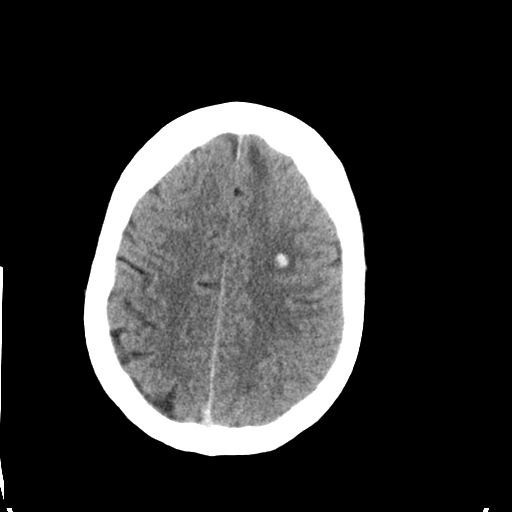
[im 29/34  brain]
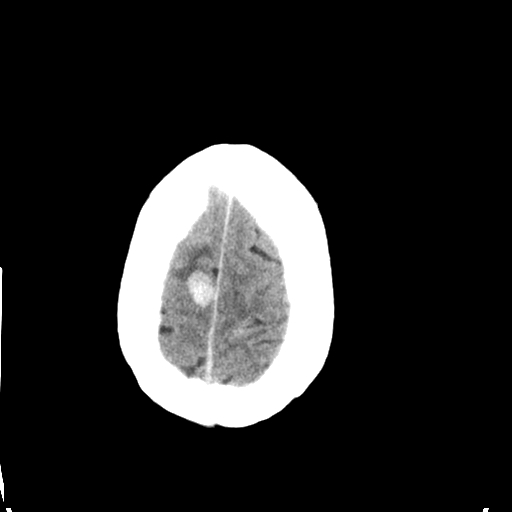

[Series 4: head 2.0 bone · axial · 0.45mm/px · z∈[-123,-107]mm · 2 of 83 slices shown]
[im 9/83  bone]
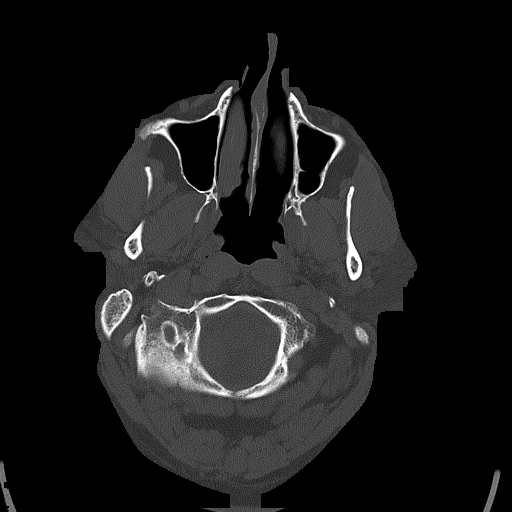
[im 17/83  bone]
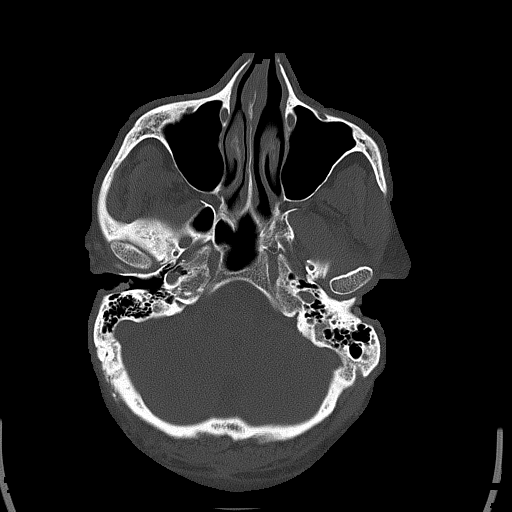

[Series 5: head 3.0 cor st · coronal · 0.32mm/px · 3 of 74 slices shown]
[im 25/74  brain]
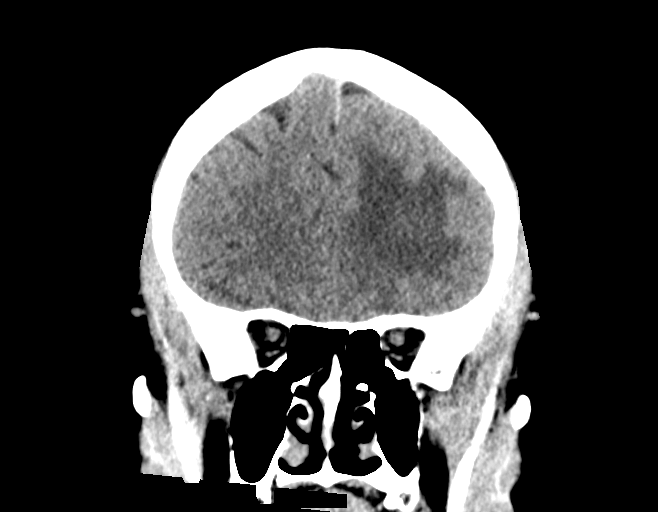
[im 33/74  brain]
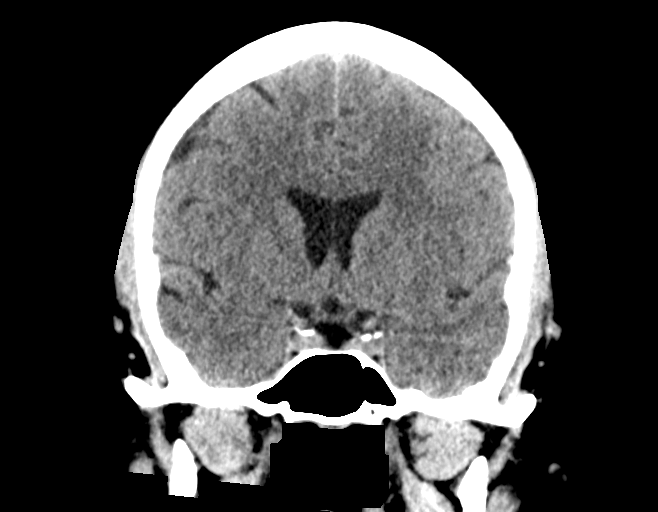
[im 41/74  brain]
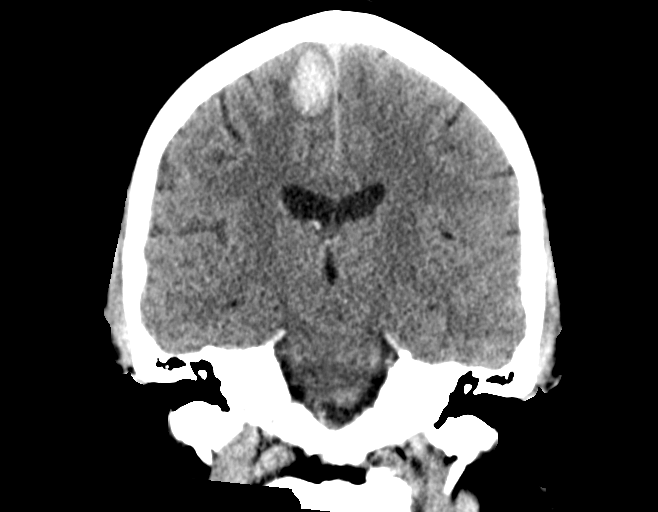

[Series 6: head 3.0 sag st · sagittal · 0.32mm/px · 3 of 67 slices shown]
[im 23/67  brain]
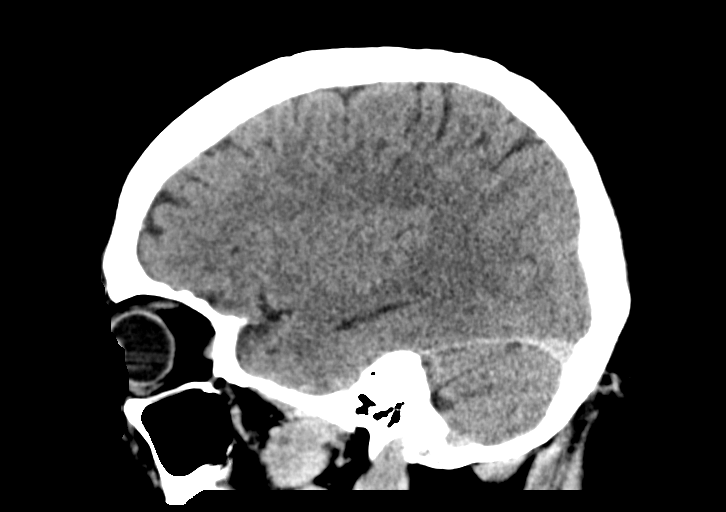
[im 34/67  brain]
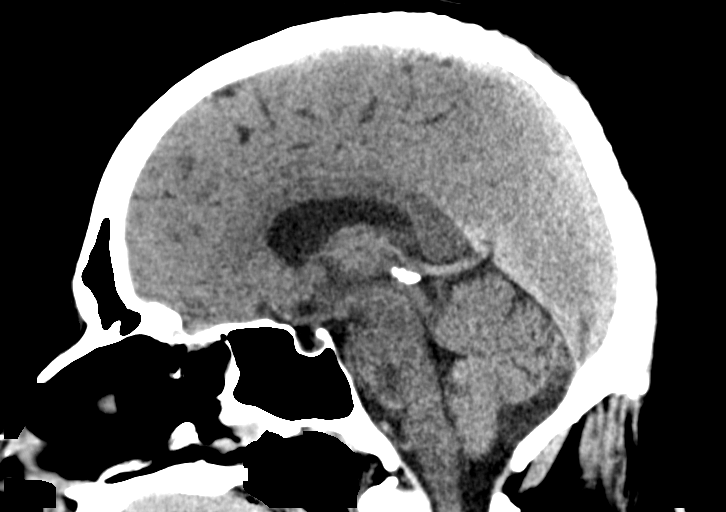
[im 45/67  brain]
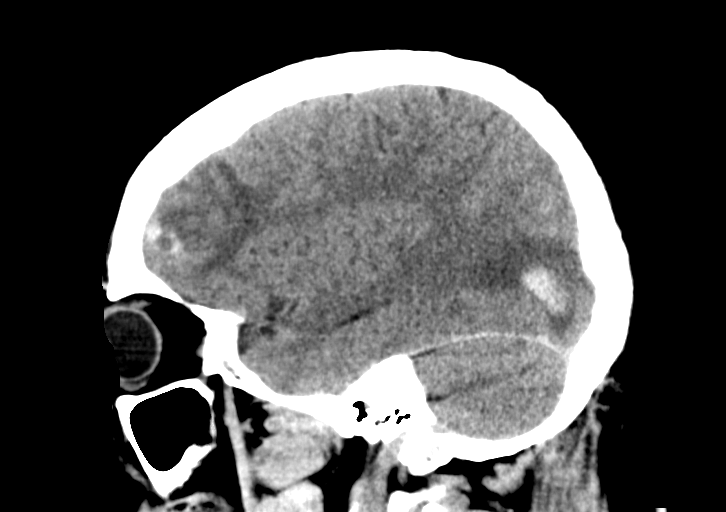

[15 of 47 positions shown; findings below may reference images not displayed]

FINDINGS: Brain: Multiple areas of hemorrhage in the brain. Many these are
associated with mass lesions and edema. Appearance is most
consistent with hemorrhagic metastasis.

Largest lesion in the left frontal lobe measures 2.8 cm in diameter
with mild hemorrhage and mild to moderate edema. Left occipital
hemorrhage with surrounding edema measures 2.5 cm. Multiple
additional smaller areas of hemorrhage are present in the frontal
and parietal lobes bilaterally and in the right occipital lobe.

Ventricle size normal. Mild midline shift to the right of
approximately 3 mm.

Vascular: Negative for hyperdense vessel

Skull: No skeletal lesion identified in the calvarium

Sinuses/Orbits: Paranasal sinuses clear.  Negative orbit.

Other: None
IMPRESSION: 1. Multiple areas of hemorrhagic metastasis in the brain with
surrounding edema. Mild midline shift to the right.
2. These results were called by telephone at the time of
interpretation on [DATE] at [DATE] to provider MOOLMAN , who
verbally acknowledged these results.

## 2020-09-14 IMAGING — CT CT CHEST-ABD-PELV W/ CM
2 of 5 series · 14 of 36 positions shown, 16 images · IV contrast (Omni 300)
Comparison: [DATE]

CLINICAL DATA: Intracranial metastases, no known primary malignancy

EXAM:
CT CHEST, ABDOMEN, AND PELVIS WITH CONTRAST
TECHNIQUE: Multidetector CT imaging of the chest, abdomen and pelvis was
performed following the standard protocol during bolus
administration of intravenous contrast.
CONTRAST:  100mL OMNIPAQUE IOHEXOL 300 MG/ML  SOLN

[Series 5: cap with 5mm st · axial · 0.89mm/px · z∈[-835,-260]mm · 11 of 137 slices shown, 13 images]
[im 11/137  mediastinal]
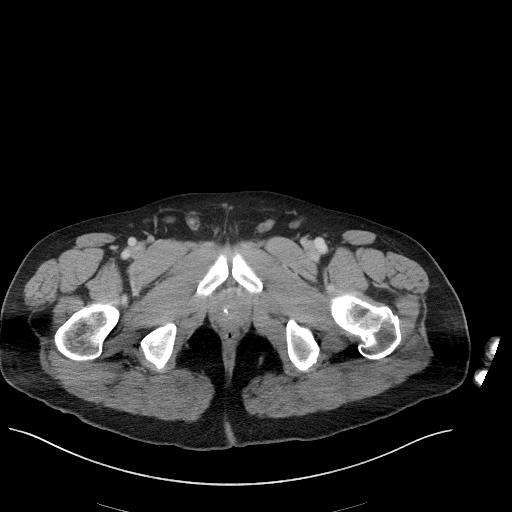
[im 11/137  bone]
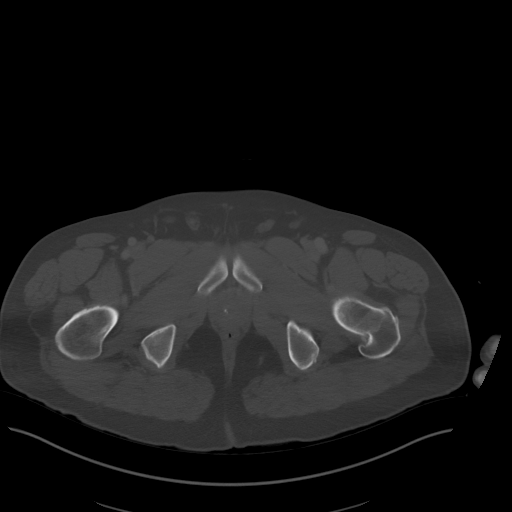
[im 21/137  mediastinal]
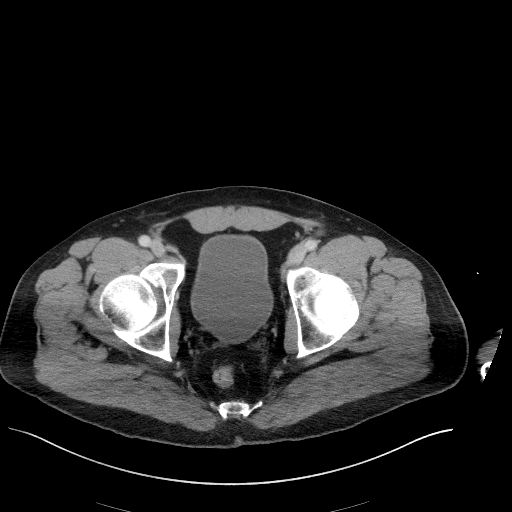
[im 32/137  mediastinal]
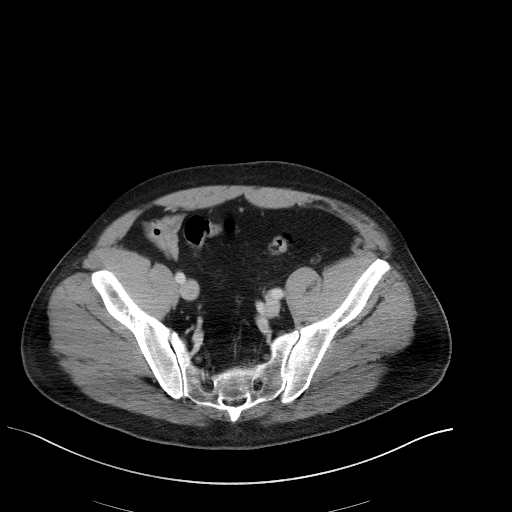
[im 42/137  mediastinal]
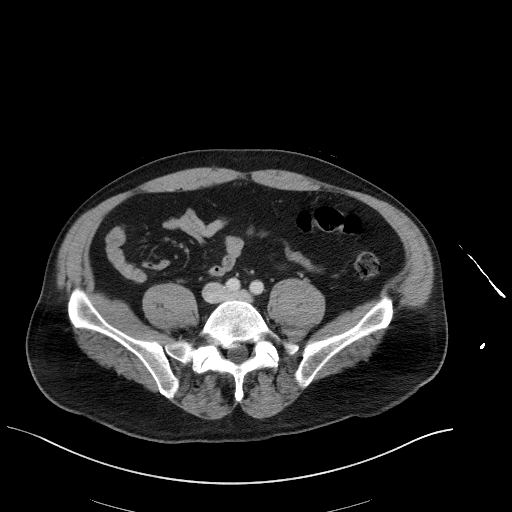
[im 53/137  mediastinal]
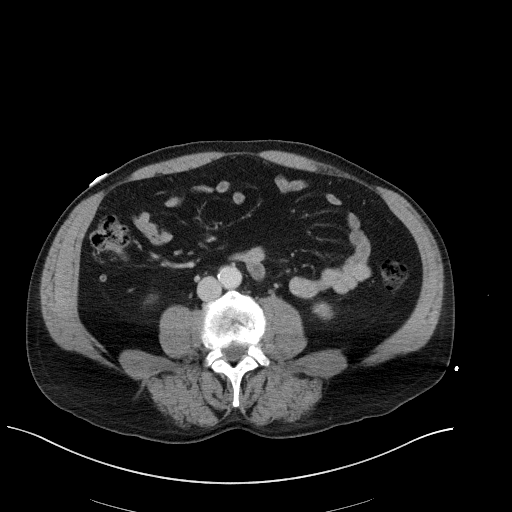
[im 74/137  mediastinal]
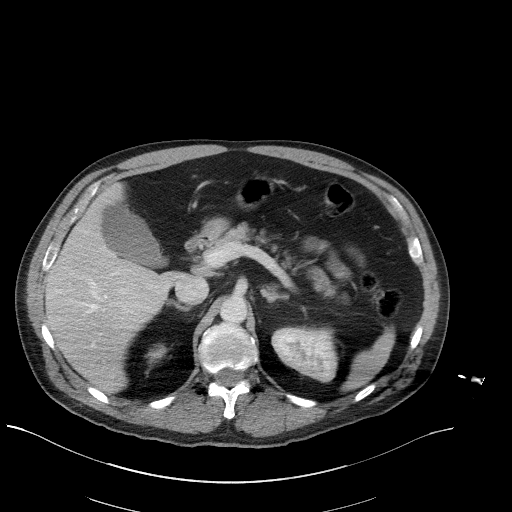
[im 84/137  mediastinal]
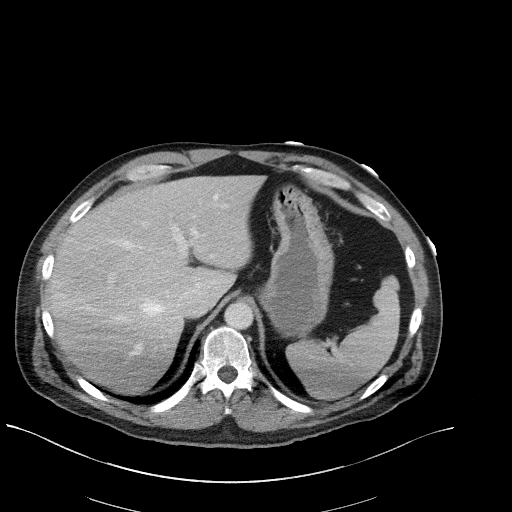
[im 95/137  mediastinal]
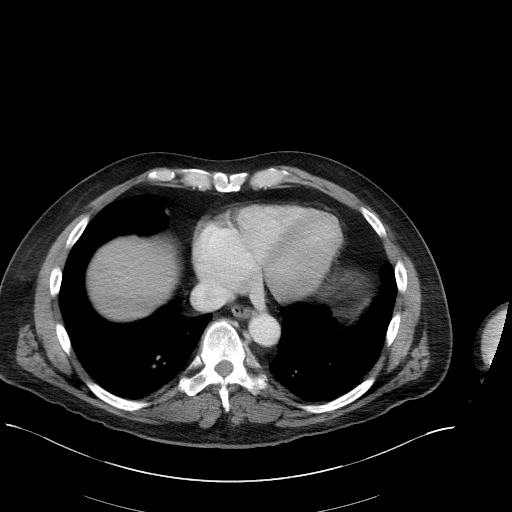
[im 105/137  mediastinal]
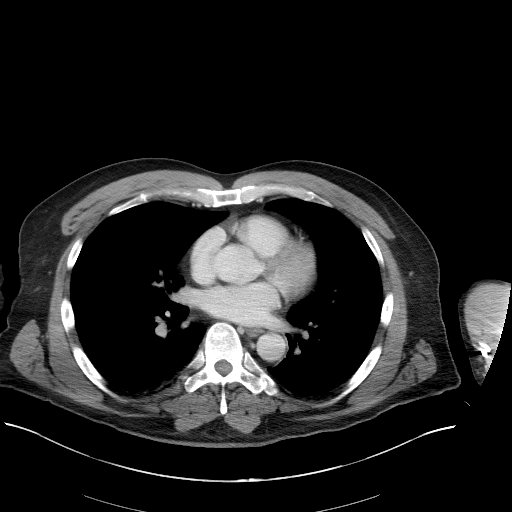
[im 105/137  bone]
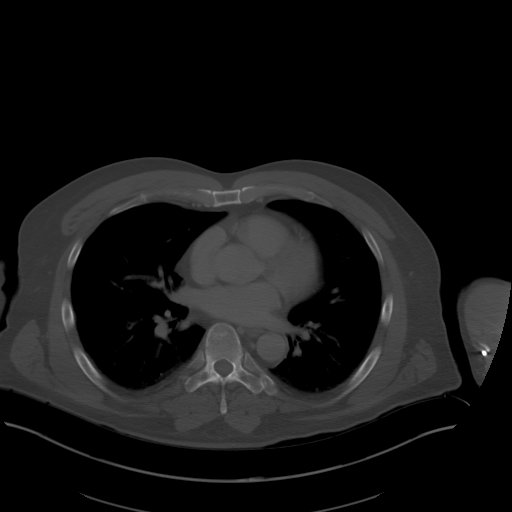
[im 116/137  mediastinal]
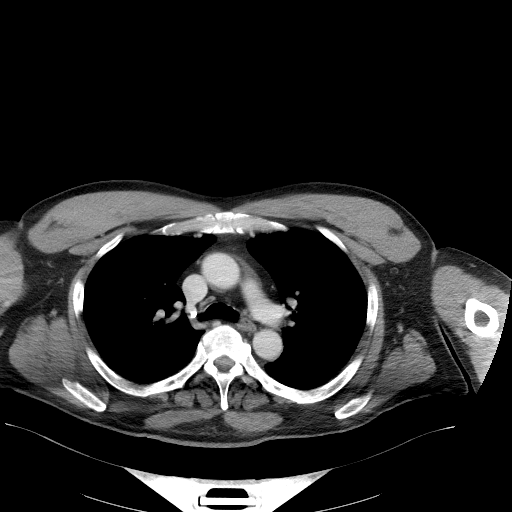
[im 126/137  mediastinal]
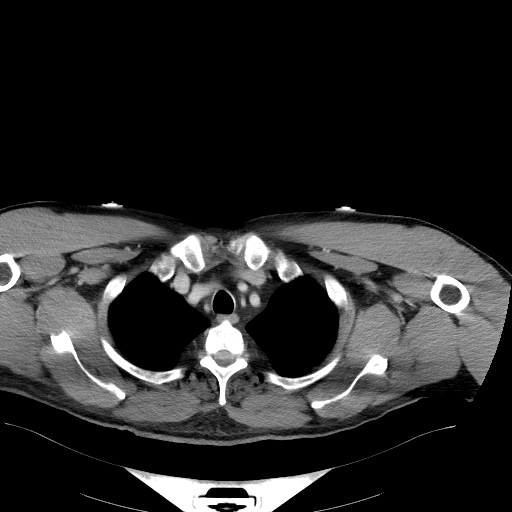

[Series 12: cap with 3mm st cor · coronal · 0.93mm/px · 3 of 151 slices shown]
[im 31/151  mediastinal]
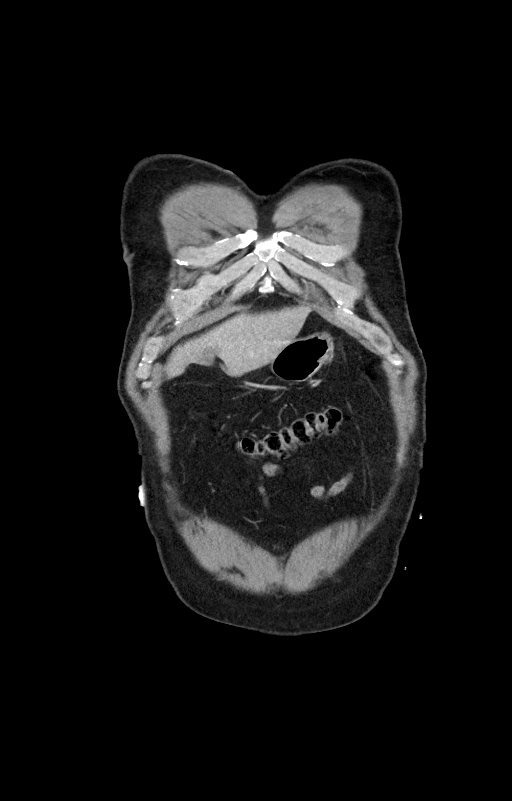
[im 61/151  mediastinal]
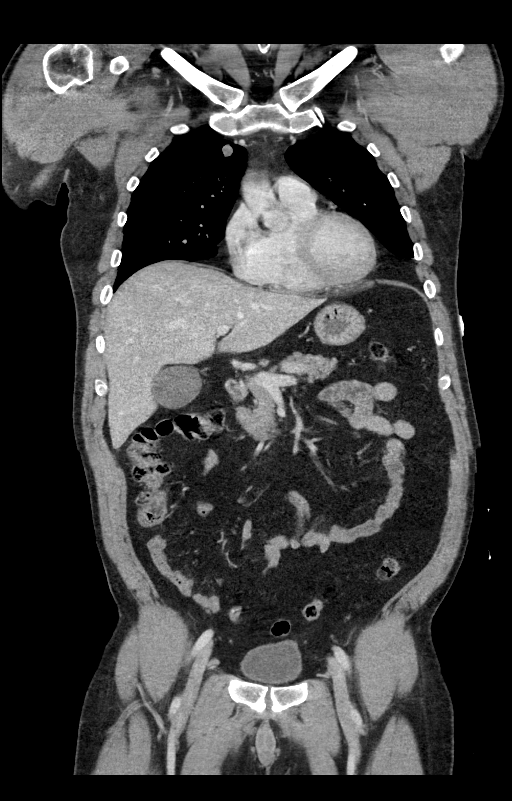
[im 91/151  mediastinal]
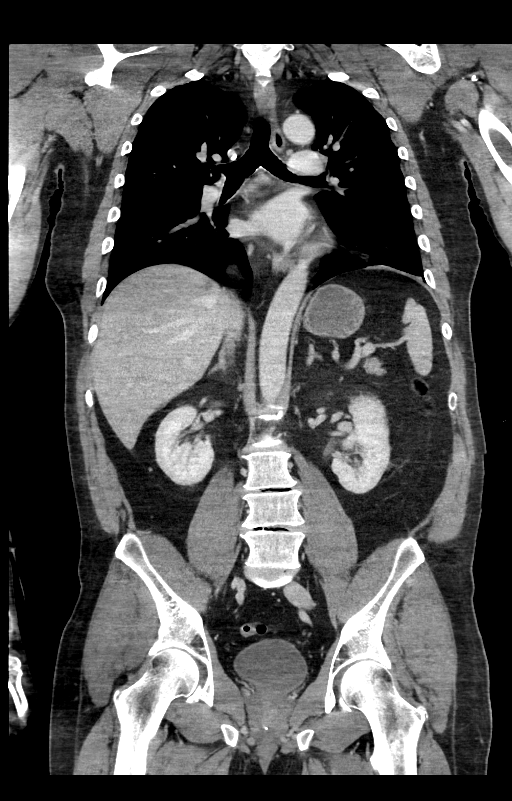

[14 of 36 positions shown; findings below may reference images not displayed]

FINDINGS: CT CHEST FINDINGS

Cardiovascular: The heart and great vessels are unremarkable without
pericardial effusion. No evidence of aortic aneurysm or dissection.

Mediastinum/Nodes: No enlarged mediastinal, hilar, or axillary lymph
nodes. Thyroid gland, trachea, and esophagus demonstrate no
significant findings.

Lungs/Pleura: Pulmonary nodules are as follows:

1.4 x 1.0 cm right upper lobe subpleural nodule image 51/6.

0.5 cm right lower lobe pulmonary nodule image 82/6.

No airspace disease, effusion, or pneumothorax. The central airways
are patent.

Musculoskeletal: No acute or destructive bony lesions. Reconstructed
images demonstrate no additional findings.

CT ABDOMEN PELVIS FINDINGS

Hepatobiliary: Multiple small hypodensities are seen within the
liver parenchyma, too small to characterize but suspicious for
metastatic disease given the intracranial findings. Hypodensities
are seen within the right lobe liver image 46 and 49, measuring up
to 11 mm in size. Hypodensity within the inferior margin of the
right lobe liver image 76 measures 13 mm.

No intrahepatic duct dilation. The gallbladder is unremarkable.

Pancreas: Unremarkable. No pancreatic ductal dilatation or
surrounding inflammatory changes.

Spleen: Normal in size without focal abnormality.

Adrenals/Urinary Tract: 1.6 cm hypodensity upper pole right kidney
measures 23 Hounsfield units image 66/5, indeterminate. This is new
since prior CT. The remainder of the kidneys enhance normally. No
urinary tract calculi or obstruction. The adrenals and bladder are
unremarkable.

Stomach/Bowel: No bowel obstruction or ileus. Normal appendix right
lower quadrant. No bowel wall thickening or inflammatory change.

Vascular/Lymphatic: No significant vascular findings are present. No
enlarged abdominal or pelvic lymph nodes.

Reproductive: Prostate is unremarkable.

Other: No free fluid or free gas. No abdominal wall hernia.

Musculoskeletal: No acute or destructive bony lesions. Reconstructed
images demonstrate no additional findings.
IMPRESSION: 1. A 1.4 x 1.0 cm right upper lobe subpleural nodule, which could
reflect primary or metastatic disease. PET-CT may be useful for
follow-up. The 0.5 cm right lower lobe nodule be too small to
characterize by PET scan, and will require serial follow-up.
2. Multiple small hypodensities within the liver parenchyma,
suspicious for liver metastases.
3. A 1.6 cm hypodensity upper pole right kidney, indeterminate. This
could be further evaluated with MRI without and with contrast.

## 2020-09-14 MED ORDER — SODIUM CHLORIDE 0.9 % IV SOLN
3000.0000 mg | Freq: Once | INTRAVENOUS | Status: AC
  Administered 2020-09-14: 3000 mg via INTRAVENOUS
  Filled 2020-09-14: qty 30

## 2020-09-14 MED ORDER — LEVETIRACETAM 500 MG PO TABS
500.0000 mg | ORAL_TABLET | Freq: Two times a day (BID) | ORAL | Status: DC
  Administered 2020-09-15 – 2020-09-18 (×7): 500 mg via ORAL
  Filled 2020-09-14 (×7): qty 1

## 2020-09-14 MED ORDER — IOHEXOL 300 MG/ML  SOLN
100.0000 mL | Freq: Once | INTRAMUSCULAR | Status: AC | PRN
  Administered 2020-09-14: 100 mL via INTRAVENOUS

## 2020-09-14 MED ORDER — DEXAMETHASONE SODIUM PHOSPHATE 10 MG/ML IJ SOLN
10.0000 mg | Freq: Once | INTRAMUSCULAR | Status: AC
  Administered 2020-09-14: 10 mg via INTRAVENOUS
  Filled 2020-09-14: qty 1

## 2020-09-14 NOTE — ED Notes (Signed)
Patient given urinal for u/a

## 2020-09-14 NOTE — Consult Note (Signed)
Neurology Consultation  Reason for Consult: Left-sided weakness, double vision, syncope, generalized malaise Referring Physician: Dr. Langston Masker ED provider/Claudia Billie Lade, ED provider  CC: Left-sided weakness, double vision, syncope  History is obtained from: Patient  HPI: Sean [name] is a 54 y.o. male with no significant past medical history presented to the emergency room for 1 to 2 weeks worth of generalized malaise and left-sided weakness.  He says that his weakness started a couple weeks ago where he was weaker on the left side and had difficulty getting out of bed.  He did not make much of it because he thought he probably has the flu or Covid.  He got tested for Covid a couple times and was negative.  His symptoms did not improve much but waxed and waned.  This morning, he had an episode where he was try to get something out of the fridge and collapsed followed by bladder incontinence.  No noticed or observed seizure activity.  Due to the ongoing myriad of symptoms, he was brought into the emergency room by family.  He waited in the triage room for a while before being seen by the ED providers who noticed the left-sided weakness and then he also complained of diplopia which only happen when he had both eyes open and disappeared while cupping each eye.  A LVO positive code stroke was activated. Taken for stat head CT which showed multiple areas of what appears to be hemorrhagic metastases in both cerebral hemispheres.  Mild midline shift to the right.  Patient denies smoking.  Uses alcohol-4-5 drinks of wine every night. Denies history of any kind of malignancy.  Code stroke was canceled after it was determined that he has metastases and not likely ischemic stroke and also was way outside any sort of intervention window should this have been an acute ischemic stroke also.  LKW: Few weeks ago tpa given?: no, hemorrhagic mets Premorbid modified Rankin scale (mRS): 0  ROS: Performed and  negative except as noted in HPI  Past Medical History:  Diagnosis Date  . Hyperlipidemia     Family History  Problem Relation Age of Onset  . Osteoarthritis Mother   . Hypertension Father   . Colon polyps Father   . Colon polyps Brother   . Colon polyps Brother   . Colon cancer Neg Hx   . Stomach cancer Neg Hx   . Rectal cancer Neg Hx   . Esophageal cancer Neg Hx   . Liver cancer Neg Hx     Social History:   reports that he has quit smoking. He has never used smokeless tobacco. He reports current alcohol use. He reports that he does not use drugs.  Medications  Current Facility-Administered Medications:  .  0.9 %  sodium chloride infusion, 500 mL, Intravenous, Continuous, Danis, Estill Cotta III, MD  Current Outpatient Medications:  .  triamcinolone cream (KENALOG) 0.1 %, Apply 1 application topically 2 (two) times daily. (Patient taking differently: Apply 1 application topically as needed. ), Disp: 30 g, Rfl: 0   Exam: Current vital signs: BP (!) 150/77   Pulse 60   Temp 98.2 F (36.8 C) (Oral)   Resp 20   Ht 5' 11.5" (1.816 m)   Wt 98.9 kg   SpO2 98%   BMI 29.98 kg/m  Vital signs in last 24 hours: Temp:  [98.2 F (36.8 C)] 98.2 F (36.8 C) (12/30 1037) Pulse Rate:  [60-81] 60 (12/30 2117) Resp:  [16-20] 20 (12/30 2117) BP: (  138-150)/(77-94) 150/77 (12/30 2117) SpO2:  [97 %-100 %] 98 % (12/30 2117) Weight:  [98.9 kg] 98.9 kg (12/30 1037) General: Awake alert in no distress HEENT: Normocephalic atraumatic Lungs: Clear Cardiovascular: Regular rhythm Abdomen soft nondistended nontender Extremities warm well perfused Neurological Awake alert oriented x3 Mildly dysarthric No aphasia Cranial nerve examination shows pupils equal round react to light, EOM disconjugate with no diplopia, it does appear that his left eyelid has a lag when he blinks but upon asking him to close his eyes tight and frowning his forehead there is no significant difference in the strength  in both eyelids, question some right field deficit but not very clear, face appears symmetric, facial sensation intact, tongue and palate midline. Motor examination: Both upper extremities are full strength 5/5.  Left lower extremity is 4+/5 with mild vertical drift.  Right lower extremities 5/5. Sensory exam: Intact to touch without extinction Coordination: No dysmetria Gait testing deferred at this time NIH stroke scale 1a Level of Conscious.: 0 1b LOC Questions: 0 1c LOC Commands: 0 2 Best Gaze: 0 3 Visual: 1 4 Facial Palsy: 0 5a Motor Arm - left: 0 5b Motor Arm - Right: 0 6a Motor Leg - Left: 1 6b Motor Leg - Right: 0 7 Limb Ataxia: 0 8 Sensory: 0 9 Best Language: 0 10 Dysarthria: 1 11 Extinct. and Inatten.: 0 TOTAL: 2   Labs I have reviewed labs in epic and the results pertinent to this consultation are:  CBC    Component Value Date/Time   WBC 15.1 (H) 09/14/2020 1042   RBC 5.37 09/14/2020 1042   HGB 16.6 09/14/2020 1042   HCT 49.2 09/14/2020 1042   PLT 304 09/14/2020 1042   MCV 91.6 09/14/2020 1042   MCH 30.9 09/14/2020 1042   MCHC 33.7 09/14/2020 1042   RDW 12.1 09/14/2020 1042    CMP     Component Value Date/Time   NA 134 (L) 09/14/2020 1042   K 4.3 09/14/2020 1042   CL 100 09/14/2020 1042   CO2 20 (L) 09/14/2020 1042   GLUCOSE 136 (H) 09/14/2020 1042   BUN 19 09/14/2020 1042   CREATININE 0.85 09/14/2020 1042   CALCIUM 9.2 09/14/2020 1042   GFRNONAA >60 09/14/2020 1042    Imaging I have reviewed the images obtained:  CT-scan of the brain-multiple hemorrhagic metastases in bilateral cerebral hemispheres with mild rightward shift. CTA head and neck: Pending CT chest abdomen pelvis with contrast: Pending  Assessment:  54 year old with no significant past medical history presenting for cough few weeks worth of feeling unwell and admitted of complaints including double vision, left-sided weakness as well as an episode where he fell and had bladder  incontinence but no witnessed seizure activity. His brain imaging reveals multiple hemorrhagic metastases with an unknown primary. His weakness on the left leg could be explained by a high right frontal lesion on the CT. Generalized weakness could be a part of the metastatic process. The episode of passing out could've been a seizure due to significant amount of cerebral edema from the metastases.  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 -MRI of the brain with and without contrast when able to get.  Might need MRIs of chest abdomen pelvis depending on what the CT shows.  -IV dexamethasone 10 mg x 1 followed by IV dexamethasone 4  mg every 6 hours for cerebral edema. -Load with Keppra 3 g IV x1 followed by Keppra 500 twice daily. -Maintain seizure precautions -Oncological consultation -SBP<160. May need IV cleviprex drip to control. -No antiplatelets or anticoagulants  D/W Patient and wife at beside  Please call neurology with questions as needed. Plan discussed with Dr. Langston Masker and Shary Key, PA-c in the emergency room.  -- Amie Portland, MD [facility] Pager: [phone] If 7pm to 7am, please call on call as listed on AMION.   ADDENDUM CT chest/abd/pel IMPRESSION: 1. A 1.4 x 1.0 cm right upper lobe subpleural nodule, which could reflect primary or metastatic disease. PET-CT may be useful for follow-up. The 0.5 cm right lower lobe nodule be too small to characterize by PET scan, and will require serial follow-up. 2. Multiple small hypodensities within the liver parenchyma, suspicious for liver metastases. 3. A 1.6 cm hypodensity upper pole right kidney, indeterminate. This could be further evaluated with MRI without and with contrast.  Further  w/u per medicine.  -- Amie Portland, MD [facility] Pager: [phone] If 7pm to 7am, please call on call as listed on AMION.

## 2020-09-14 NOTE — ED Triage Notes (Signed)
Pt reports vomiting and fever since yesterday. Has had 2 negative covid tests at home. Does endorse some weakness to L leg since Sunday. Did have syncopal episode this am, but also reports that his leg gave way causing him to fall even though he denies hitting his head. Ems noticed possible urinary incontinence? Pt unaware. Ems stroke screen negative.

## 2020-09-14 NOTE — ED Provider Notes (Addendum)
[facility] EMERGENCY DEPARTMENT Provider Note   CSN: [id number] Arrival date & time: 09/14/20  1030     History Chief Complaint  Patient presents with  . Vomiting  . Loss of Consciousness    Sean [name] is a 54 y.o. male presents to the ED by EMS for evaluation of fall and syncope.  Unfortunately, patient has been in the ER waiting room for almost 11 hours prior to my evaluation.  Reports at 9 AM this morning he walked over to the refrigerator and opened the door and collapsed and fell into the refrigerator.  He lost consciousness.  He is not sure how long he was out for, his wife called 911.  Since the fall he has noticed left-sided leg and foot weakness "not cooperating".  He is having to drag his left leg. EMS told him that he had urinated on himself but he is not sure - his wife didn't see urine spots on the floor. Denies bowel incontinence. On December 19th he developed double vision "seeing two" and pressure on top of his forehead. He took aspirin. Double vision persisted and currently having double vision. If he pushes his temples double vision isn't as bad. Four days ago he developed diffuse body aches, he thought he was coming down with the flu. Temp no higher than 99 F. He took two COVID at home tests, negative. He vomited once. He denies nasal congestion, sore throat, cough, Cp, Sob, abdominal pain, diarrhea.  Denies recent back injuries, back pain, saddle anesthesia. Has not been to his doctor in 4 years. Does not have any medical problems. Does not take any medicines. Drinks 4 glasses of wine daily. Last drink December 26th. No recreational or IV drug use. No history of seizures. Wife is en route to ER.   HPI     Past Medical History:  Diagnosis Date  . Hyperlipidemia     Patient Active Problem List   Diagnosis Date Noted  . Brain metastasis ([place]) 09/14/2020    Past Surgical History:  Procedure Laterality Date  . HERNIA REPAIR         Family  History  Problem Relation Age of Onset  . Osteoarthritis Mother   . Hypertension Father   . Colon polyps Father   . Colon polyps Brother   . Colon polyps Brother   . Colon cancer Neg Hx   . Stomach cancer Neg Hx   . Rectal cancer Neg Hx   . Esophageal cancer Neg Hx   . Liver cancer Neg Hx     Social History   Tobacco Use  . Smoking status: Former Research scientist (life sciences)  . Smokeless tobacco: Never Used  Substance Use Topics  . Alcohol use: Yes    Comment: 2-3 glasses of wine a day  . Drug use: No    Home Medications Prior to Admission medications   Medication Sig Start Date End Date Taking? Authorizing Provider  triamcinolone cream (KENALOG) 0.1 % Apply 1 application topically 2 (two) times daily. Patient taking differently: Apply 1 application topically as needed.  09/14/16   Melony Overly, MD    Allergies    Patient has no known allergies.  Review of Systems   Review of Systems  Eyes: Positive for visual disturbance.  Gastrointestinal: Positive for vomiting (x1).  Neurological: Positive for weakness and headaches.       Bladder incontinence   All other systems reviewed and are negative.   Physical Exam Updated Vital Signs BP 139/78  Pulse 62   Temp 98.2 F (36.8 C) (Oral)   Resp 16   Ht 5' 11.5" (1.816 m)   Wt 98.9 kg   SpO2 99%   BMI 29.98 kg/m   Physical Exam Vitals and nursing note reviewed.  Constitutional:      General: He is not in acute distress.    Appearance: He is well-developed and well-nourished.     Comments: NAD.  HENT:     Head: Normocephalic and atraumatic.     Right Ear: External ear normal.     Left Ear: External ear normal.     Nose: Nose normal.  Eyes:     General: No scleral icterus.    Extraocular Movements:     Right eye: Abnormal extraocular motion present.     Left eye: Abnormal extraocular motion present.     Conjunctiva/sclera: Conjunctivae normal.     Pupils: Pupils are unequal.     Comments: Left pupil slightly larger than  right. Slow blink left eye. Disconjugate gaze. Periorbital skin normal   Cardiovascular:     Rate and Rhythm: Normal rate and regular rhythm.     Pulses: Intact distal pulses.     Heart sounds: Normal heart sounds. No murmur heard.   Pulmonary:     Effort: Pulmonary effort is normal.     Breath sounds: Normal breath sounds. No wheezing.  Musculoskeletal:        General: No deformity. Normal range of motion.     Cervical back: Normal range of motion and neck supple.  Skin:    General: Skin is warm and dry.     Capillary Refill: Capillary refill takes less than 2 seconds.  Neurological:     Mental Status: He is alert and oriented to person, place, and time.     Comments:   Mental Status: Patient is awake, alert, oriented to person, place, year, and situation. Patient is able to give a clear and coherent history.  Speech is fluent and clear without dysarthria or aphasia. No signs of neglect.  Cranial Nerves: I not tested II visual fields full bilaterally.   III, IV, VI disconjugate gaze, left eye blinking slowly. Left pupil bigger than right.  V sensation to light touch intact in all 3 divisions of trigeminal nerve bilaterally  VII facial movements symmetric bilaterally VIII hearing intact to voice/conversation  IX, X no uvula deviation, symmetric rise of soft palate/uvula XI 4/5 strength LEFT SCM and trapezius strength, 5/5 on right  XII tongue protrusion midline, symmetric L/R movements  Motor: 4/5 strength LUE, 3/5 strength LLE. Normal on right.  Cerebellar: difficulty with FTN, patient cannot see my finger well. No truncal sway. Normal Romberg.   Psychiatric:        Mood and Affect: Mood and affect normal.        Behavior: Behavior normal.        Thought Content: Thought content normal.        Judgment: Judgment normal.     ED Results / Procedures / Treatments   Labs (all labs ordered are listed, but only abnormal results are displayed) Labs Reviewed  BASIC METABOLIC  PANEL - Abnormal; Notable for the following components:      Result Value   Sodium 134 (*)    CO2 20 (*)    Glucose, Bld 136 (*)    All other components within normal limits  CBC - Abnormal; Notable for the following components:   WBC 15.1 (*)  All other components within normal limits  CBC - Abnormal; Notable for the following components:   WBC 18.6 (*)    MCHC 36.4 (*)    All other components within normal limits  DIFFERENTIAL - Abnormal; Notable for the following components:   Neutro Abs 15.8 (*)    Monocytes Absolute 1.6 (*)    Abs Immature Granulocytes 0.17 (*)    All other components within normal limits  COMPREHENSIVE METABOLIC PANEL - Abnormal; Notable for the following components:   Sodium 131 (*)    Potassium 3.4 (*)    Chloride 97 (*)    CO2 20 (*)    Glucose, Bld 140 (*)    AST 64 (*)    ALT 102 (*)    Total Bilirubin 1.4 (*)    All other components within normal limits  CBG MONITORING, ED - Abnormal; Notable for the following components:   Glucose-Capillary 134 (*)    All other components within normal limits  I-STAT CHEM 8, ED - Abnormal; Notable for the following components:   Sodium 134 (*)    Chloride 97 (*)    Creatinine, Ser 0.60 (*)    Glucose, Bld 139 (*)    Calcium, Ion 1.09 (*)    TCO2 21 (*)    All other components within normal limits  RESP PANEL BY RT-PCR (FLU A&B, COVID) ARPGX2  ETHANOL  PROTIME-INR  APTT  URINALYSIS, ROUTINE W REFLEX MICROSCOPIC  RAPID URINE DRUG SCREEN, HOSP PERFORMED  URINALYSIS, ROUTINE W REFLEX MICROSCOPIC    EKG EKG Interpretation  Date/Time:  Thursday September 14 2020 10:35:31 EST Ventricular Rate:  67 PR Interval:  136 QRS Duration: 82 QT Interval:  394 QTC Calculation: 416 R Axis:   56 Text Interpretation: Normal sinus rhythm Normal ECG No STEMI Confirmed by Alvester Chourifan, Matthew [phone]) on 09/14/2020 8:48:45 PM   Radiology CT ANGIO HEAD W OR WO CONTRAST  Result Date: 09/14/2020 CLINICAL DATA:  Neuro  deficit, acute, stroke suspected. EXAM: CT ANGIOGRAPHY HEAD AND NECK TECHNIQUE: Multidetector CT imaging of the head and neck was performed using the standard protocol during bolus administration of intravenous contrast. Multiplanar CT image reconstructions and MIPs were obtained to evaluate the vascular anatomy. Carotid stenosis measurements (when applicable) are obtained utilizing NASCET criteria, using the distal internal carotid diameter as the denominator. CONTRAST:  100mL OMNIPAQUE IOHEXOL 300 MG/ML  SOLN COMPARISON:  Head CT September 14, 2020. FINDINGS: CT HEAD FINDINGS Brain: Postcontrast images showed multiple hemorrhagic lesions (series 12), as seen on prior CT: - measuring 17 mm in the right frontal lobe with mild surrounding edema, image 28; - 6 mm in the left frontal lobe, minimal surrounding edema, image 25; - 5 mm in the right parietal lobe, no significant edema, image 23; - 15 mm the left parietal lobe, mild surrounding vasogenic edema, image 23; - 31 mm in the left frontal lobe with surrounding vasogenic edema and effacement of the adjacent cerebral sulci, image 20; - 27 mm in the left occipital lobe, decompressing into the occipital horn of the left lateral ventricle with surrounding vasogenic edema effacing the adjacent cerebral sulci, image 17. In addition to the previously seen hemorrhagic lesions, enhancing 2 small enhancing lesions are seen in the left frontal lobe measuring 2-3 mm each (image 26) and a 6 mm enhancing lesion is seen in the right occipital lobe (image 13). No hydrocephalus. Evaluation for subarachnoid hemorrhage limited due to intravenous contrast. Vascular: See below. Skull: Normal. Negative for fracture or focal lesion.  Sinuses: Imaged portions are clear. Orbits: No acute finding. Review of the MIP images confirms the above findings CTA NECK FINDINGS Aortic arch: Common origin of the innominate and left common carotid arteries from the aortic arch. The left vertebral artery  origin is directly from the aortic arch. Imaged portion shows no evidence of aneurysm or dissection. No significant stenosis of the major arch vessel origins. Right carotid system: No evidence of dissection, stenosis (50% or greater) or occlusion. Left carotid system: No evidence of dissection, stenosis (50% or greater) or occlusion. Vertebral arteries: The left vertebral artery origin is directly from the aortic arch and is dominant. No evidence of dissection, stenosis (50% or greater) or occlusion. Skeleton: No aggressive lesion identified. Other neck: Negative. Upper chest: Negative. Review of the MIP images confirms the above findings CTA HEAD FINDINGS Anterior circulation: No significant stenosis, proximal occlusion, aneurysm, or vascular malformation. Posterior circulation: No significant stenosis, proximal occlusion, aneurysm, or vascular malformation. Venous sinuses: As permitted by contrast timing, patent. Review of the MIP images confirms the above findings IMPRESSION: 1. No large vessel occlusion, hemodynamically significant stenosis, or evidence of dissection. 2. Multiple hemorrhagic lesions are seen in the bilateral cerebral hemispheres as described above. Electronically Signed   By: Pedro Earls M.D.   On: 09/14/2020 22:24   CT ANGIO NECK W OR WO CONTRAST  Result Date: 09/14/2020 CLINICAL DATA:  Neuro deficit, acute, stroke suspected. EXAM: CT ANGIOGRAPHY HEAD AND NECK TECHNIQUE: Multidetector CT imaging of the head and neck was performed using the standard protocol during bolus administration of intravenous contrast. Multiplanar CT image reconstructions and MIPs were obtained to evaluate the vascular anatomy. Carotid stenosis measurements (when applicable) are obtained utilizing NASCET criteria, using the distal internal carotid diameter as the denominator. CONTRAST:  153mL OMNIPAQUE IOHEXOL 300 MG/ML  SOLN COMPARISON:  Head CT September 14, 2020. FINDINGS: CT HEAD FINDINGS Brain:  Postcontrast images showed multiple hemorrhagic lesions (series 12), as seen on prior CT: - measuring 17 mm in the right frontal lobe with mild surrounding edema, image 28; - 6 mm in the left frontal lobe, minimal surrounding edema, image 25; - 5 mm in the right parietal lobe, no significant edema, image 23; - 15 mm the left parietal lobe, mild surrounding vasogenic edema, image 23; - 31 mm in the left frontal lobe with surrounding vasogenic edema and effacement of the adjacent cerebral sulci, image 20; - 27 mm in the left occipital lobe, decompressing into the occipital horn of the left lateral ventricle with surrounding vasogenic edema effacing the adjacent cerebral sulci, image 17. In addition to the previously seen hemorrhagic lesions, enhancing 2 small enhancing lesions are seen in the left frontal lobe measuring 2-3 mm each (image 26) and a 6 mm enhancing lesion is seen in the right occipital lobe (image 13). No hydrocephalus. Evaluation for subarachnoid hemorrhage limited due to intravenous contrast. Vascular: See below. Skull: Normal. Negative for fracture or focal lesion. Sinuses: Imaged portions are clear. Orbits: No acute finding. Review of the MIP images confirms the above findings CTA NECK FINDINGS Aortic arch: Common origin of the innominate and left common carotid arteries from the aortic arch. The left vertebral artery origin is directly from the aortic arch. Imaged portion shows no evidence of aneurysm or dissection. No significant stenosis of the major arch vessel origins. Right carotid system: No evidence of dissection, stenosis (50% or greater) or occlusion. Left carotid system: No evidence of dissection, stenosis (50% or greater) or occlusion. Vertebral arteries:  The left vertebral artery origin is directly from the aortic arch and is dominant. No evidence of dissection, stenosis (50% or greater) or occlusion. Skeleton: No aggressive lesion identified. Other neck: Negative. Upper chest:  Negative. Review of the MIP images confirms the above findings CTA HEAD FINDINGS Anterior circulation: No significant stenosis, proximal occlusion, aneurysm, or vascular malformation. Posterior circulation: No significant stenosis, proximal occlusion, aneurysm, or vascular malformation. Venous sinuses: As permitted by contrast timing, patent. Review of the MIP images confirms the above findings IMPRESSION: 1. No large vessel occlusion, hemodynamically significant stenosis, or evidence of dissection. 2. Multiple hemorrhagic lesions are seen in the bilateral cerebral hemispheres as described above. Electronically Signed   By: Pedro Earls M.D.   On: 09/14/2020 22:24   CT CHEST ABDOMEN PELVIS W CONTRAST  Result Date: 09/14/2020 CLINICAL DATA:  Intracranial metastases, no known primary malignancy EXAM: CT CHEST, ABDOMEN, AND PELVIS WITH CONTRAST TECHNIQUE: Multidetector CT imaging of the chest, abdomen and pelvis was performed following the standard protocol during bolus administration of intravenous contrast. CONTRAST:  165mL OMNIPAQUE IOHEXOL 300 MG/ML  SOLN COMPARISON:  03/13/2015 FINDINGS: CT CHEST FINDINGS Cardiovascular: The heart and great vessels are unremarkable without pericardial effusion. No evidence of aortic aneurysm or dissection. Mediastinum/Nodes: No enlarged mediastinal, hilar, or axillary lymph nodes. Thyroid gland, trachea, and esophagus demonstrate no significant findings. Lungs/Pleura: Pulmonary nodules are as follows: 1.4 x 1.0 cm right upper lobe subpleural nodule image 51/6. 0.5 cm right lower lobe pulmonary nodule image 82/6. No airspace disease, effusion, or pneumothorax. The central airways are patent. Musculoskeletal: No acute or destructive bony lesions. Reconstructed images demonstrate no additional findings. CT ABDOMEN PELVIS FINDINGS Hepatobiliary: Multiple small hypodensities are seen within the liver parenchyma, too small to characterize but suspicious for  metastatic disease given the intracranial findings. Hypodensities are seen within the right lobe liver image 46 and 49, measuring up to 11 mm in size. Hypodensity within the inferior margin of the right lobe liver image 76 measures 13 mm. No intrahepatic duct dilation. The gallbladder is unremarkable. Pancreas: Unremarkable. No pancreatic ductal dilatation or surrounding inflammatory changes. Spleen: Normal in size without focal abnormality. Adrenals/Urinary Tract: 1.6 cm hypodensity upper pole right kidney measures 23 Hounsfield units image 66/5, indeterminate. This is new since prior CT. The remainder of the kidneys enhance normally. No urinary tract calculi or obstruction. The adrenals and bladder are unremarkable. Stomach/Bowel: No bowel obstruction or ileus. Normal appendix right lower quadrant. No bowel wall thickening or inflammatory change. Vascular/Lymphatic: No significant vascular findings are present. No enlarged abdominal or pelvic lymph nodes. Reproductive: Prostate is unremarkable. Other: No free fluid or free gas. No abdominal wall hernia. Musculoskeletal: No acute or destructive bony lesions. Reconstructed images demonstrate no additional findings. IMPRESSION: 1. A 1.4 x 1.0 cm right upper lobe subpleural nodule, which could reflect primary or metastatic disease. PET-CT may be useful for follow-up. The 0.5 cm right lower lobe nodule be too small to characterize by PET scan, and will require serial follow-up. 2. Multiple small hypodensities within the liver parenchyma, suspicious for liver metastases. 3. A 1.6 cm hypodensity upper pole right kidney, indeterminate. This could be further evaluated with MRI without and with contrast. Electronically Signed   By: Randa Ngo M.D.   On: 09/14/2020 22:15   CT HEAD CODE STROKE WO CONTRAST  Result Date: 09/14/2020 CLINICAL DATA:  Code stroke.  Acute neuro deficit.  Vomiting. EXAM: CT HEAD WITHOUT CONTRAST TECHNIQUE: Contiguous axial images were  obtained from the  base of the skull through the vertex without intravenous contrast. COMPARISON:  None. FINDINGS: Brain: Multiple areas of hemorrhage in the brain. Many these are associated with mass lesions and edema. Appearance is most consistent with hemorrhagic metastasis. Largest lesion in the left frontal lobe measures 2.8 cm in diameter with mild hemorrhage and mild to moderate edema. Left occipital hemorrhage with surrounding edema measures 2.5 cm. Multiple additional smaller areas of hemorrhage are present in the frontal and parietal lobes bilaterally and in the right occipital lobe. Ventricle size normal. Mild midline shift to the right of approximately 3 mm. Vascular: Negative for hyperdense vessel Skull: No skeletal lesion identified in the calvarium Sinuses/Orbits: Paranasal sinuses clear.  Negative orbit. Other: None IMPRESSION: 1. 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 Mild midline shift to the right. 2. These results were called by telephone at the time of interpretation on 09/14/2020 at 9:44 pm to provider Wilford Corner , who verbally acknowledged these results. Electronically Signed   By: Marlan Palau M.D.   On: 09/14/2020 21:45    Procedures .Critical Care Performed by: Liberty Handy, PA-C Authorized by: Liberty Handy, PA-C   Critical care provider statement:    Critical care time (minutes):  45   Critical care was necessary to treat or prevent imminent or life-threatening deterioration of the following conditions:  CNS failure or compromise (hemorrhagic mets brain, cancer)   Critical care was time spent personally by me on the following activities:  Discussions with consultants, evaluation of patient's response to treatment, examination of patient, ordering and performing treatments and interventions, ordering and review of laboratory studies, ordering and review of radiographic studies, pulse oximetry, re-evaluation of patient's condition,  obtaining history from patient or surrogate, review of old charts and development of treatment plan with patient or surrogate   I assumed direction of critical care for this patient from another provider in my specialty: no     Care discussed with: admitting provider     (including critical care time)  Medications Ordered in ED Medications  dexamethasone (DECADRON) injection 10 mg (has no administration in time range)  levETIRAcetam (KEPPRA) 3,000 mg in sodium chloride 0.9 % 250 mL IVPB (has no administration in time range)  levETIRAcetam (KEPPRA) tablet 500 mg (has no administration in time range)  iohexol (OMNIPAQUE) 300 MG/ML solution 100 mL (100 mLs Intravenous Contrast Given 09/14/20 2158)    ED Course  I have reviewed the triage vital signs and the nursing notes.  Pertinent labs & imaging results that were available during my care of the patient were reviewed by me and considered in my medical decision making (see chart for details).  Clinical Course as of 09/14/20 2246  Thu Sep 14, 2020  2116 Patient evaluated. Discussed with EDP. Stroke code activated. Neurology paged [CG]  2152 CT HEAD CODE STROKE WO CONTRAST IMPRESSION: 1. 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 Mild midline shift to the right. 2. These results were called by telephone at the time of interpretation on 09/14/2020 at 9:44 pm to provider Wilford Corner , who verbally acknowledged these results.   Electronically Signed By: Marlan Palau M.D. On: 09/14/2020 21:45 [CG]  2204 54 yo male w/ no reported pmhx presenting to ED with diplopia, headache, and left foot weakness for several days.  Patient presented complaining of headache nausea and vomiting at triage, had prolonged wait time.  On evaluation by EDP was found to have diplopia and left foot drop.  Neurology urgently consulted and [facility] subsequently demonstrated multiple hemorrhagic brain mets.  IV decadron and IV keppra  ordered per discussion with Dr Rory Percy of neurology.  Cancer CT imaging of remainder of body in process - anticipate medical admission for oncology evaluation.   [MT]  2231 CT CHEST ABDOMEN PELVIS W CONTRAST IMPRESSION: 1. A 1.4 x 1.0 cm right upper lobe subpleural nodule, which could reflect primary or metastatic disease. PET-CT may be useful for follow-up. The 0.5 cm right lower lobe nodule be too small to characterize by PET scan, and will require serial follow-up. 2. Multiple small hypodensities within the liver parenchyma, suspicious for liver metastases. 3. A 1.6 cm hypodensity upper pole right kidney, indeterminate. This could be further evaluated with MRI without and with contrast.   [CG]    Clinical Course User Index [CG] Kinnie Feil, PA-C [MT] Langston Masker Carola Rhine, MD   MDM Rules/Calculators/A&P                           54 y.o. yo with chief complaint of fall, syncope with left leg/foot weakness, bladder incontinence at 9 am today.  Patient in ER WR for close to 11 hours. Reports over one week of double vision, myalgias, one episode of vomiting. Drinks 4 cups wine daily.   On exam he has disconjugate gaze, double vision, left upper/lower extremity weakness.    Given concerning exam with LKN ~ 9 am, LVO code stroke paged out. Spoke to Dr Rory Percy who took patient to CT scan. Shared with EDP.   Previous medical records available, triage and nursing notes reviewed to obtain more history and assist with MDM  Chief complain involves an extensive number of treatment options and is a complaint that carries with it a high risk of complications and morbidity and mortality.    Differential diagnosis: cardiac arrhythmia vs CNS etiology like CVA, ICH, seizure, Todd's paralysis.   ER lab work and imaging ordered by triage RN and me, as above  I have personally visualized and interpreted ER diagnostic work up including labs and imaging.    Labs reveal - WBC 15.1. unremarkable otherwise.    Imaging reveals - multiple areas of hemorrhagic metastasis in the brain with edema, mild midline shift to right. Neurology has added CT CAP and CTAs.   2200: Discussed imaging findings with Dr Rory Percy who recommends decadron, keppra load and medicine admit for further work up. Pending further imaging.   Medications ordered - decadron, keppra.   Ordered continuous cardiac and pulse ox monitoring.  Will plan for serial re-examinations. Close monitoring.   2240: CTs unfortunately show nodules/hypodensities in right lung, liver, right kidney. Possible primary in ?lung.   2245: Re-evaluated the patient.   Updated on imaging findings, plan to admit for further work up. Spoke to Dr Hal Hope who will admit patient.   Consults made in the ED: neurology, hospitalist.   Shared with EDP. Final Clinical Impression(s) / ED Diagnoses Final diagnoses:  Metastasis to brain of unknown origin [facility])    Rx / [place] Orders ED Discharge Orders    None         Arlean Hopping 09/14/20 2254    Wyvonnia Dusky, MD 09/14/20 2328

## 2020-09-14 NOTE — Code Documentation (Addendum)
Responded to Code Stroke called at 2135 on pt already in the ED. Code Stroke called for L leg weakness and double vision on pt . Per pt, weakness started 1-2 weeks ago. CBG-134, NIH-2 for L hemianopia and LLE drift, CT head-multiple hemorrhagic metastases. CTA head/CT chest/abd/pelvis pending. Code Stroke cancelled at 2156. Plan to admit to medical team.

## 2020-09-15 ENCOUNTER — Observation Stay (HOSPITAL_COMMUNITY): Payer: 59

## 2020-09-15 ENCOUNTER — Encounter (HOSPITAL_COMMUNITY): Payer: Self-pay | Admitting: Internal Medicine

## 2020-09-15 ENCOUNTER — Other Ambulatory Visit: Payer: Self-pay

## 2020-09-15 DIAGNOSIS — R32 Unspecified urinary incontinence: Secondary | ICD-10-CM | POA: Diagnosis present

## 2020-09-15 DIAGNOSIS — C799 Secondary malignant neoplasm of unspecified site: Secondary | ICD-10-CM | POA: Diagnosis present

## 2020-09-15 DIAGNOSIS — R030 Elevated blood-pressure reading, without diagnosis of hypertension: Secondary | ICD-10-CM | POA: Diagnosis present

## 2020-09-15 DIAGNOSIS — R29702 NIHSS score 2: Secondary | ICD-10-CM | POA: Diagnosis present

## 2020-09-15 DIAGNOSIS — C7931 Secondary malignant neoplasm of brain: Secondary | ICD-10-CM | POA: Diagnosis present

## 2020-09-15 DIAGNOSIS — E871 Hypo-osmolality and hyponatremia: Secondary | ICD-10-CM | POA: Insufficient documentation

## 2020-09-15 DIAGNOSIS — G936 Cerebral edema: Secondary | ICD-10-CM | POA: Diagnosis present

## 2020-09-15 DIAGNOSIS — Z87891 Personal history of nicotine dependence: Secondary | ICD-10-CM | POA: Diagnosis not present

## 2020-09-15 DIAGNOSIS — R569 Unspecified convulsions: Secondary | ICD-10-CM | POA: Diagnosis not present

## 2020-09-15 DIAGNOSIS — R531 Weakness: Secondary | ICD-10-CM | POA: Diagnosis present

## 2020-09-15 DIAGNOSIS — Z20822 Contact with and (suspected) exposure to covid-19: Secondary | ICD-10-CM | POA: Diagnosis present

## 2020-09-15 DIAGNOSIS — E785 Hyperlipidemia, unspecified: Secondary | ICD-10-CM | POA: Diagnosis present

## 2020-09-15 DIAGNOSIS — C801 Malignant (primary) neoplasm, unspecified: Secondary | ICD-10-CM | POA: Diagnosis present

## 2020-09-15 DIAGNOSIS — R160 Hepatomegaly, not elsewhere classified: Secondary | ICD-10-CM | POA: Diagnosis present

## 2020-09-15 DIAGNOSIS — C7801 Secondary malignant neoplasm of right lung: Secondary | ICD-10-CM | POA: Diagnosis present

## 2020-09-15 DIAGNOSIS — K760 Fatty (change of) liver, not elsewhere classified: Secondary | ICD-10-CM | POA: Diagnosis present

## 2020-09-15 DIAGNOSIS — R7989 Other specified abnormal findings of blood chemistry: Secondary | ICD-10-CM | POA: Insufficient documentation

## 2020-09-15 DIAGNOSIS — E876 Hypokalemia: Secondary | ICD-10-CM | POA: Insufficient documentation

## 2020-09-15 DIAGNOSIS — F1020 Alcohol dependence, uncomplicated: Secondary | ICD-10-CM | POA: Insufficient documentation

## 2020-09-15 DIAGNOSIS — Z79899 Other long term (current) drug therapy: Secondary | ICD-10-CM | POA: Diagnosis not present

## 2020-09-15 DIAGNOSIS — H532 Diplopia: Secondary | ICD-10-CM | POA: Diagnosis present

## 2020-09-15 DIAGNOSIS — M21372 Foot drop, left foot: Secondary | ICD-10-CM | POA: Diagnosis present

## 2020-09-15 LAB — CBC
HCT: 49.7 % (ref 39.0–52.0)
Hemoglobin: 17.2 g/dL — ABNORMAL HIGH (ref 13.0–17.0)
MCH: 31.4 pg (ref 26.0–34.0)
MCHC: 34.6 g/dL (ref 30.0–36.0)
MCV: 90.9 fL (ref 80.0–100.0)
Platelets: 284 10*3/uL (ref 150–400)
RBC: 5.47 MIL/uL (ref 4.22–5.81)
RDW: 12.5 % (ref 11.5–15.5)
WBC: 16.5 10*3/uL — ABNORMAL HIGH (ref 4.0–10.5)
nRBC: 0 % (ref 0.0–0.2)

## 2020-09-15 LAB — RAPID URINE DRUG SCREEN, HOSP PERFORMED
Amphetamines: NOT DETECTED
Barbiturates: NOT DETECTED
Benzodiazepines: NOT DETECTED
Cocaine: NOT DETECTED
Opiates: NOT DETECTED
Tetrahydrocannabinol: NOT DETECTED

## 2020-09-15 LAB — COMPREHENSIVE METABOLIC PANEL
ALT: 141 U/L — ABNORMAL HIGH (ref 0–44)
AST: 82 U/L — ABNORMAL HIGH (ref 15–41)
Albumin: 4 g/dL (ref 3.5–5.0)
Alkaline Phosphatase: 113 U/L (ref 38–126)
Anion gap: 14 (ref 5–15)
BUN: 13 mg/dL (ref 6–20)
CO2: 23 mmol/L (ref 22–32)
Calcium: 9.4 mg/dL (ref 8.9–10.3)
Chloride: 101 mmol/L (ref 98–111)
Creatinine, Ser: 0.91 mg/dL (ref 0.61–1.24)
GFR, Estimated: >60 mL/min (ref >60)
Glucose, Bld: 135 mg/dL — ABNORMAL HIGH (ref 70–99)
Potassium: 3.7 mmol/L (ref 3.5–5.1)
Sodium: 138 mmol/L (ref 135–145)
Total Bilirubin: 1.6 mg/dL — ABNORMAL HIGH (ref 0.3–1.2)
Total Protein: 7.5 g/dL (ref 6.5–8.1)

## 2020-09-15 LAB — URINALYSIS, ROUTINE W REFLEX MICROSCOPIC
Bacteria, UA: NONE SEEN
Bilirubin Urine: NEGATIVE
Glucose, UA: NEGATIVE mg/dL
Ketones, ur: NEGATIVE mg/dL
Leukocytes,Ua: NEGATIVE
Nitrite: NEGATIVE
Protein, ur: NEGATIVE mg/dL
Specific Gravity, Urine: 1.006 (ref 1.005–1.030)
pH: 7 (ref 5.0–8.0)

## 2020-09-15 LAB — HIV ANTIBODY (ROUTINE TESTING W REFLEX): HIV Screen 4th Generation wRfx: NONREACTIVE

## 2020-09-15 LAB — PSA: Prostatic Specific Antigen: 0.31 ng/mL (ref 0.00–4.00)

## 2020-09-15 IMAGING — MR MR ABDOMEN WO/W CM
14 series · 47 of 48 positions shown · IV contrast (Contrast agent)
Comparison: CT chest, abdomen and pelvis of [DATE]

CLINICAL DATA: Metastatic disease, indeterminate RIGHT renal lesion
in this 54-year-old male found to have intracranial abnormalities
and abnormalities on recent CT of the chest, abdomen and pelvis in
the abdomen

EXAM:
MRI ABDOMEN WITHOUT AND WITH CONTRAST
TECHNIQUE: Multiplanar multisequence MR imaging of the abdomen was performed
both before and after the administration of intravenous contrast.
CONTRAST:  10mL GADAVIST GADOBUTROL 1 MMOL/ML IV SOLN

[Series 4: cor haste · coronal · 6.0mm · 1.19mm/px · 2 of 36 slices shown]
[im 1/36]
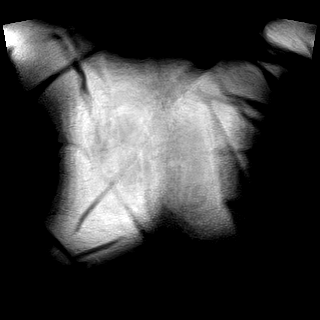
[im 36/36]
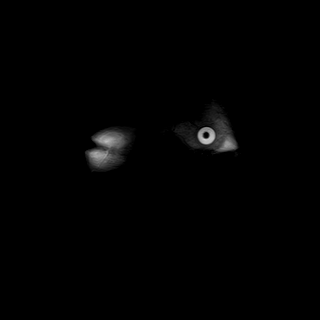

[Series 5: ax haste · axial · 6.0mm · 1.28mm/px · z∈[-201,+109]mm · 2 of 44 slices shown]
[im 1/44]
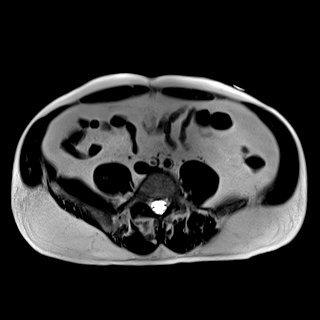
[im 44/44]
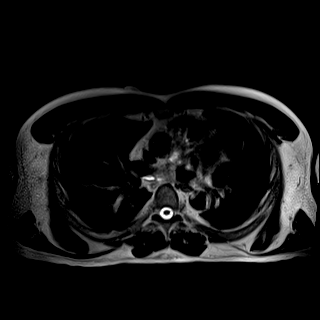

[Series 6: T2 fat-sat · axial · 6.0mm · 1.28mm/px · z∈[-201,+109]mm · 2 of 44 slices shown]
[im 1/44]
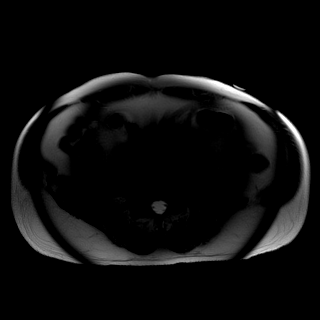
[im 44/44]
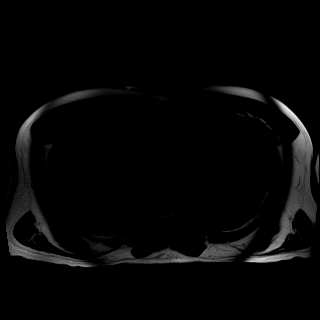

[Series 8: DWI · axial · 6.0mm · 1.57mm/px · z∈[-201,+109]mm · 6 of 132 slices shown (1 of 2)]
[im 1/132]
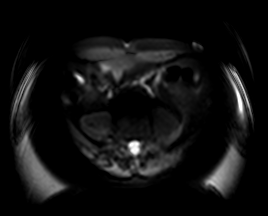
[im 27/132]
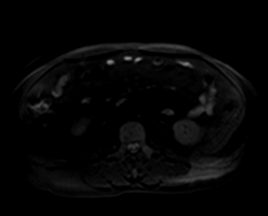
[im 53/132]
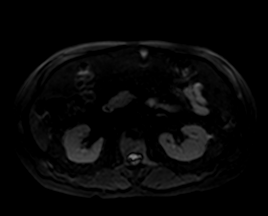
[im 79/132]
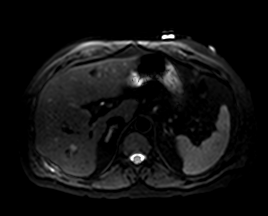
[im 105/132]
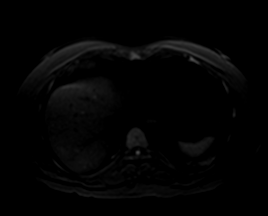
[im 132/132]
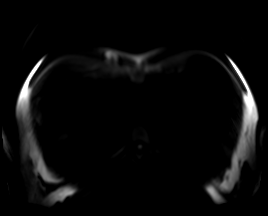

[Series 9: DWI · axial · 6.0mm · 1.57mm/px · z∈[-201,+109]mm · 2 of 44 slices shown (2 of 2)]
[im 1/44]
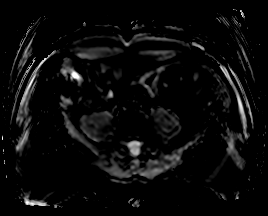
[im 44/44]
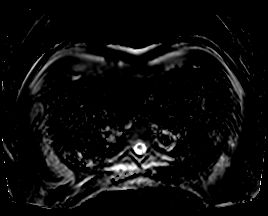

[Series 11: bSSFP · axial · 6.0mm · 0.82mm/px · z∈[-201,+109]mm · 2 of 44 slices shown]
[im 1/44]
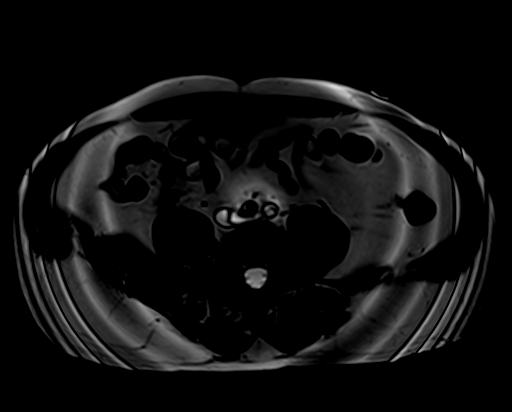
[im 44/44]
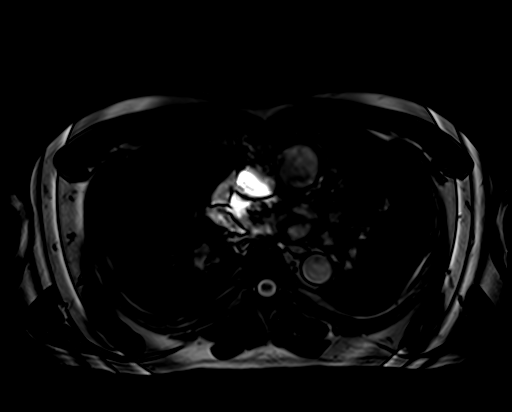

[Series 14: t1_vibe_opp-in_tra_p4_bh · axial · 3.0mm · 1.31mm/px · z∈[-175,+110]mm · 4 of 96 slices shown (1 of 2)]
[im 1/96]
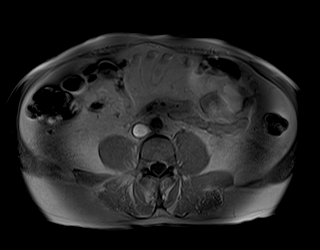
[im 32/96]
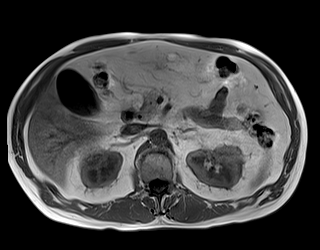
[im 64/96]
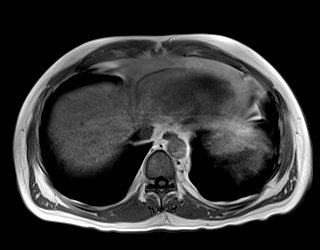
[im 96/96]
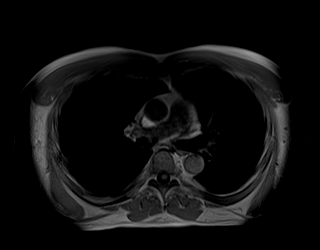

[Series 14: t1_vibe_opp-in_tra_p4_bh · axial · 3.0mm · 1.31mm/px · z∈[-175,+110]mm · 4 of 96 slices shown (2 of 2)]
[im 1/96]
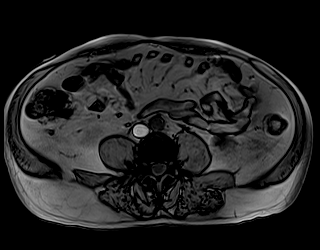
[im 32/96]
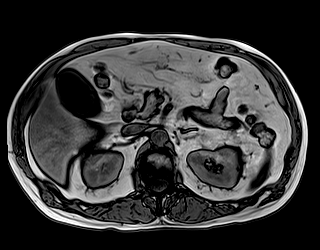
[im 64/96]
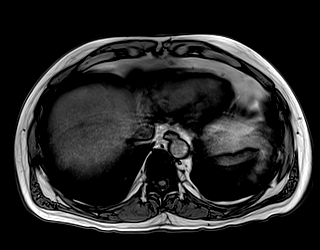
[im 96/96]
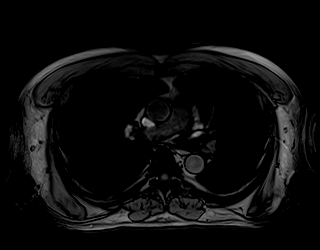

[Series 16: t1_vibe_fs_tra_p4_bh_pre · axial · 3.0mm · 1.28mm/px · z∈[-177,+108]mm · 4 of 96 slices shown]
[im 1/96]
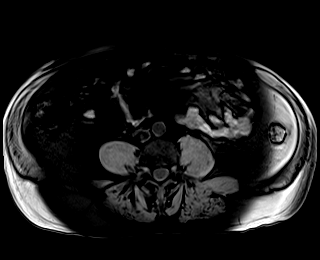
[im 32/96]
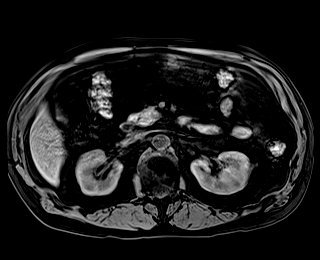
[im 64/96]
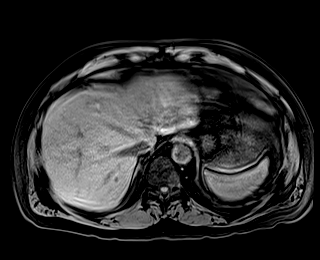
[im 96/96]
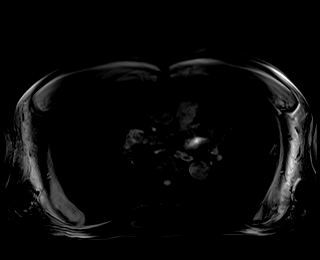

[Series 18: t1_vibe_fs_tra_p4_bh_post · axial · 3.0mm · 1.28mm/px · z∈[-177,+108]mm · 4 of 96 slices shown (1 of 4)]
[im 1/96]
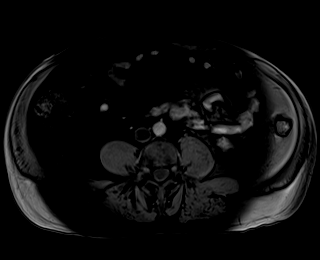
[im 32/96]
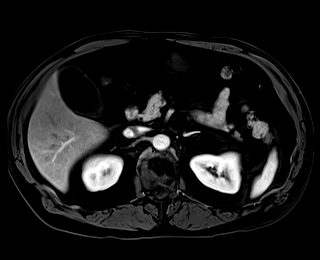
[im 64/96]
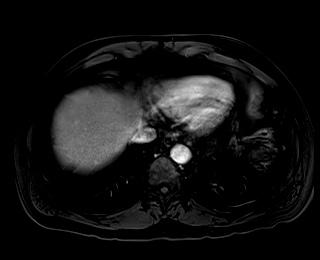
[im 96/96]
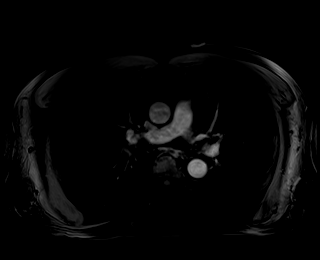

[Series 19: t1_vibe_fs_tra_p4_bh_post · axial · 3.0mm · 1.28mm/px · z∈[-177,+108]mm · 4 of 96 slices shown (2 of 4)]
[im 1/96]
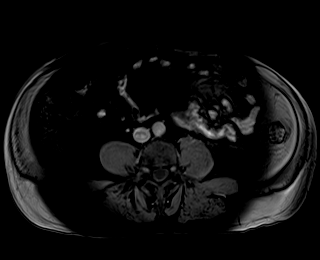
[im 32/96]
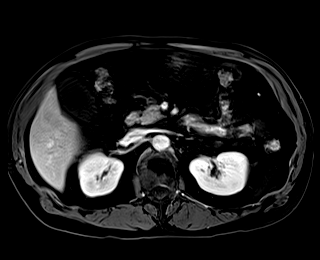
[im 64/96]
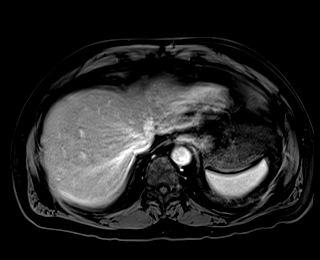
[im 96/96]
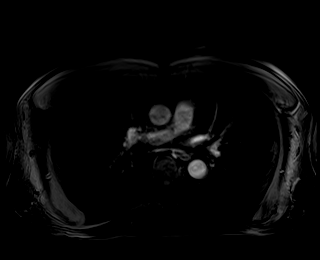

[Series 20: t1_vibe_fs_tra_p4_bh_post · axial · 3.0mm · 1.28mm/px · z∈[-177,+108]mm · 4 of 96 slices shown (3 of 4)]
[im 1/96]
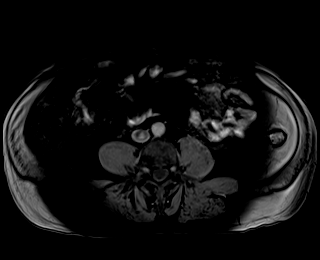
[im 32/96]
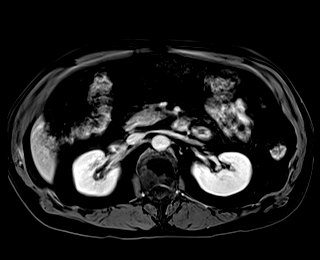
[im 64/96]
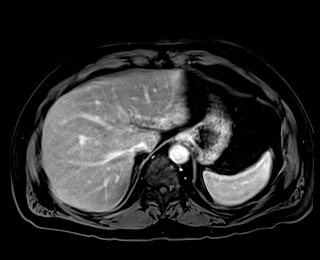
[im 96/96]
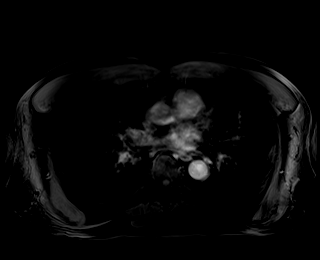

[Series 21: t1_vibe_fs_tra_p4_bh_post · axial · 3.0mm · 1.28mm/px · z∈[-177,+108]mm · 4 of 96 slices shown (4 of 4)]
[im 1/96]
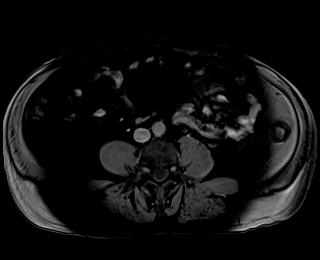
[im 32/96]
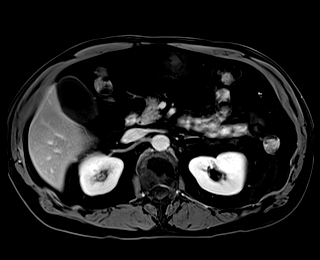
[im 64/96]
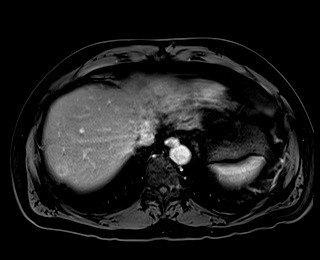
[im 96/96]
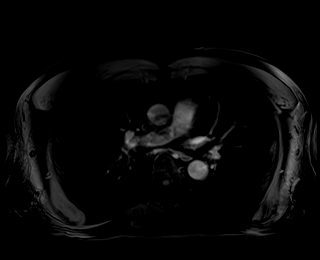

[Series 22: T1 dynamic post-contrast · coronal · 3.0mm · 1.31mm/px · 3 of 88 slices shown]
[im 1/88]
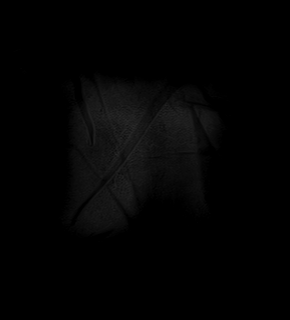
[im 30/88]
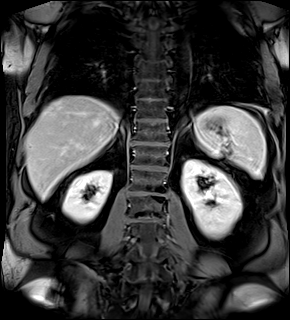
[im 59/88]
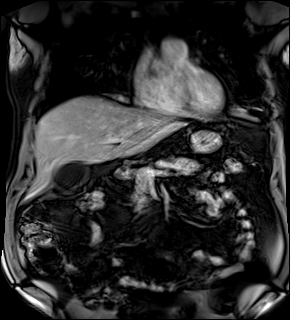

[47 of 48 positions shown; findings below may reference images not displayed]

FINDINGS: Lower chest: Incidental imaging of the lung bases on MRI with
limited assessment is unremarkable. No effusion. No consolidative
changes.

Hepatobiliary: Numerous areas of diffusion related abnormalities in
the hepatic parenchyma, for instance on image 97 of series 6 there
are 4 distinct lesions 1 in the posterior RIGHT hepatic lobe likely
corresponding to 1 of the areas seen on CT and 2 in the medial RIGHT
hepatic lobe near the IVC confluence. Assessment of diffusion
weighted images shows greater than 20 scattered foci of subtle
restricted diffusion throughout the liver, subtle based on size

There is a tiny focus of restricted diffusion along the margin of
the posterior RIGHT hemi liver which may be extrahepatic (image 111,
series 8) this measures approximately 5 mm. For reference the
largest area of signal abnormality in the liver measures 11 mm on
image 96 of series 8. The rest of these abnormalities are less than
a cm and most within the 3-4 mm size range.

The largest area of diffusion related signal abnormality shows
peripheral enhancement and signs of washout measuring 16 mm (image
36, series 18) greatest axial dimension on the contrasted images.
Other tiny foci are scattered throughout the liver showing
enhancement though not as well seen is on diffusion related images.
Scattered tiny foci show intrinsic T1 hyperintensity.

No signs of biliary duct dilation or pericholecystic stranding.

Pancreas: Normal intrinsic T1 signal. No ductal dilation or sign of
inflammation.

Spleen:  Normal size spleen without focal lesion.

Adrenals/Urinary Tract:  Adrenal glands are normal.

Symmetric renal enhancement. No hydronephrosis. No suspicious renal
lesion. The area of concern in the RIGHT upper pole discussed on the
previous CT evaluation is not visible on MRI showing subtle
wedge-shaped characteristics but slightly more rounded than expected
for infarct or pyelonephritis though potentially related focal
pyelonephritis given that it is not visible on the current exam.
Subtle stranding is noted in this location adjacent to the kidney.

Stomach/Bowel: No acute gastrointestinal process to the extent
evaluated. Note that there is limited assessment of the
gastrointestinal tract on MRI not performed for bowel evaluation.

Vascular/Lymphatic: Patent abdominal vasculature. There is no
gastrohepatic or hepatoduodenal ligament lymphadenopathy. No
retroperitoneal or mesenteric lymphadenopathy.

Other: Trace subtle indistinct appearance of the RIGHT posterior
hepatic lobe. Area of restricted diffusion along the inferior RIGHT
hepatic margin shows intrinsic T1 hyperintensity.

Musculoskeletal: No suspicious bone lesions identified.
IMPRESSION: Multifocal metastatic disease in the liver with intrinsic T1
hyperintensity, raising the question of metastatic melanoma based on
signal characteristics, small size and multiplicity. Other
differential considerations would include carcinoid tumor given the
appearance on diffusion-weighted imaging.

Area of concern in the RIGHT kidney is not visualized on the current
exam. Suggest close attention on follow-up to this location. Perhaps
this represented a small area of focal nephritis. Could be that this
is isointense to parenchyma on baseline images to slightly
hyperintense and does not separated cell from the surrounding cortex
well on post-contrast images though it cannot be seen on other
sequences either on today's study.

Hepatic steatosis.

## 2020-09-15 MED ORDER — MORPHINE SULFATE (PF) 4 MG/ML IV SOLN
4.0000 mg | INTRAVENOUS | Status: DC | PRN
  Administered 2020-09-15: 4 mg via INTRAVENOUS
  Filled 2020-09-15: qty 1

## 2020-09-15 MED ORDER — POTASSIUM CHLORIDE IN NACL 20-0.9 MEQ/L-% IV SOLN
INTRAVENOUS | Status: AC
  Filled 2020-09-15: qty 1000

## 2020-09-15 MED ORDER — ACETAMINOPHEN 325 MG PO TABS: 650.0000 mg | ORAL_TABLET | Freq: Four times a day (QID) | ORAL | Status: DC | PRN

## 2020-09-15 MED ORDER — DEXAMETHASONE SODIUM PHOSPHATE 4 MG/ML IJ SOLN
4.0000 mg | Freq: Four times a day (QID) | INTRAMUSCULAR | Status: DC
  Administered 2020-09-15 – 2020-09-18 (×14): 4 mg via INTRAVENOUS
  Filled 2020-09-15 (×14): qty 1

## 2020-09-15 MED ORDER — LORAZEPAM 2 MG/ML IJ SOLN
0.0000 mg | Freq: Four times a day (QID) | INTRAMUSCULAR | Status: AC
  Administered 2020-09-15 – 2020-09-16 (×2): 1 mg via INTRAVENOUS
  Filled 2020-09-15 (×2): qty 1

## 2020-09-15 MED ORDER — THIAMINE HCL 100 MG PO TABS
100.0000 mg | ORAL_TABLET | Freq: Every day | ORAL | Status: DC
  Administered 2020-09-15 – 2020-09-18 (×4): 100 mg via ORAL
  Filled 2020-09-15 (×4): qty 1

## 2020-09-15 MED ORDER — MORPHINE SULFATE (PF) 4 MG/ML IV SOLN: 4.0000 mg | INTRAVENOUS | Status: DC | PRN

## 2020-09-15 MED ORDER — HYDROMORPHONE HCL 1 MG/ML IJ SOLN
1.0000 mg | INTRAMUSCULAR | Status: DC | PRN
Start: 2020-09-15 — End: 2020-09-16
  Administered 2020-09-15 – 2020-09-16 (×5): 1 mg via INTRAVENOUS
  Filled 2020-09-15 (×5): qty 1

## 2020-09-15 MED ORDER — LORAZEPAM 2 MG/ML IJ SOLN: 0.0000 mg | Freq: Two times a day (BID) | INTRAMUSCULAR | Status: DC

## 2020-09-15 MED ORDER — HYDROCODONE-ACETAMINOPHEN 5-325 MG PO TABS
1.0000 | ORAL_TABLET | ORAL | Status: DC | PRN
  Administered 2020-09-15: 1 via ORAL
  Administered 2020-09-16 (×2): 2 via ORAL
  Administered 2020-09-16: 1 via ORAL
  Administered 2020-09-17 – 2020-09-18 (×5): 2 via ORAL
  Filled 2020-09-15 (×2): qty 2
  Filled 2020-09-15: qty 1
  Filled 2020-09-15 (×4): qty 2
  Filled 2020-09-15: qty 1
  Filled 2020-09-15: qty 2

## 2020-09-15 MED ORDER — LORAZEPAM 1 MG PO TABS: 1.0000 mg | ORAL_TABLET | ORAL | Status: AC | PRN

## 2020-09-15 MED ORDER — FOLIC ACID 1 MG PO TABS
1.0000 mg | ORAL_TABLET | Freq: Every day | ORAL | Status: DC
  Administered 2020-09-15 – 2020-09-18 (×4): 1 mg via ORAL
  Filled 2020-09-15 (×4): qty 1

## 2020-09-15 MED ORDER — ONDANSETRON HCL 4 MG/2ML IJ SOLN
4.0000 mg | Freq: Four times a day (QID) | INTRAMUSCULAR | Status: DC | PRN
  Administered 2020-09-15: 4 mg via INTRAVENOUS
  Filled 2020-09-15: qty 2

## 2020-09-15 MED ORDER — GADOBUTROL 1 MMOL/ML IV SOLN
10.0000 mL | Freq: Once | INTRAVENOUS | Status: AC | PRN
  Administered 2020-09-15: 10 mL via INTRAVENOUS

## 2020-09-15 MED ORDER — LORAZEPAM 2 MG/ML IJ SOLN: 1.0000 mg | INTRAMUSCULAR | Status: AC | PRN

## 2020-09-15 MED ORDER — HYDROMORPHONE HCL 1 MG/ML IJ SOLN: 1.0000 mg | INTRAMUSCULAR | Status: DC | PRN

## 2020-09-15 MED ORDER — HYDRALAZINE HCL 20 MG/ML IJ SOLN: 10.0000 mg | INTRAMUSCULAR | Status: DC | PRN

## 2020-09-15 MED ORDER — PANTOPRAZOLE SODIUM 40 MG PO TBEC
40.0000 mg | DELAYED_RELEASE_TABLET | Freq: Every day | ORAL | Status: DC
  Administered 2020-09-15 – 2020-09-18 (×4): 40 mg via ORAL
  Filled 2020-09-15 (×4): qty 1

## 2020-09-15 MED ORDER — ADULT MULTIVITAMIN W/MINERALS CH
1.0000 | ORAL_TABLET | Freq: Every day | ORAL | Status: DC
  Administered 2020-09-15 – 2020-09-18 (×4): 1 via ORAL
  Filled 2020-09-15 (×4): qty 1

## 2020-09-15 MED ORDER — DEXAMETHASONE SODIUM PHOSPHATE 4 MG/ML IJ SOLN: 4.0000 mg | Freq: Four times a day (QID) | INTRAMUSCULAR | Status: DC

## 2020-09-15 MED ORDER — THIAMINE HCL 100 MG/ML IJ SOLN: 100.0000 mg | Freq: Every day | INTRAMUSCULAR | Status: DC

## 2020-09-15 NOTE — ED Notes (Signed)
Ordered breakfast 

## 2020-09-15 NOTE — Progress Notes (Signed)
[facility]                                                                              Patient Demographics  Sean [name], is a 54 y.o. male, DOB - 16-Oct-1965, [id number]  Admit date - 09/14/2020   Admitting Physician Rise Patience, MD  Outpatient Primary MD for the patient is Deland Pretty, MD  Outpatient specialists:   LOS - 0  days   Medical records reviewed and are as summarized below:    Chief Complaint  Patient presents with  . Vomiting  . Loss of Consciousness       Brief summary   Patient is a 54 year old male with no significant past medical history, remote smoking history, not taking any medications regularly presented to ED with 1 to 2 weeks of malaise, more recent left-sided weakness, nausea, vomiting, and a brief loss of consciousness with urinary incontinence reported by EMS. He thought he had a viral illness and had 2 negative COVID tests prior to coming in. Reports smoking 1.5 ppd for 20 years before he quit. Drinks ~5 "generous" glasses of wine every day and has history of mild withdrawal symptoms. Does not know of any family history of cancer.  In ED, CT head showed multiple hemorrhagic metastasis with surrounding edema and mild midline shift to the right. CTA head and neck negative for large vessel occlusion. CT chest abdomen pelvis showed right upper lobe subpleural nodule which could reflect primary malignancy or metastasis, multiple liver lesion concerning for mass, indeterminate right renal hypodensity.  Patient was evaluated by neurology in ED and started on Decadron Keppra and Norco.   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  Found to have multiple brain metastasis with surrounding edema and mild midline  shift to the right -Appreciate neurology mentation, continue Decadron, Keppra, seizure precautions -Tumor markers ordered including PSA, CEA, CA 19-9, AFP -MRI abdomen showed multifocal metastatic disease in the liver with intrinsic T1 hyperintensity raising the question of metastatic melanoma, area of concern is the right kidney not visualized on the current exam -Oncology consult obtained, discussed with Dr. [name], will await recommendations -IR consult obtained, discussed with Dr. Laurence Ferrari, earliest biopsy can be done on Monday morning  Active Problems: Suspected seizure -Presented with LOC, urinary incontinence -Likely due to brain mets with surrounding edema and midline shift -Continue Keppra 500 mg twice daily, Decadron  Hyponatremia, hypokalemia -Resolved  Transaminitis -Possibly due to liver mets, follow closely  Alcohol dependence -Patient reported drinking 5 jumpers glasses of wine every day, has mild withdrawal symptoms -Continue CIWA with Ativan  Code Status: Full CODE STATUS DVT Prophylaxis: SCDs Family Communication: Discussed all imaging results, lab results, explained to the patient and wife at the bedside   Disposition Plan:     Status is: Observation  The patient will require care spanning > 2 midnights and should be moved to inpatient because: Inpatient level of care appropriate due to severity of illness  Dispo: The patient  is from: Home              Anticipated d/c is to: Home              Anticipated d/c date is: 3 days              Patient currently is not medically stable to d/c.,  Pending EEG, biopsy, awaiting oncology recommendations     Time Spent in minutes   35 minutes  Procedures:  CTA, MRI abdomen  Consultants:   Oncology, Dr. [name] Interventional radiology  Antimicrobials:   Anti-infectives (From admission, onward)   None          Medications  Scheduled Meds: . dexamethasone (DECADRON) injection  4 mg Intravenous  [id number]  . folic acid  1 mg Oral Daily  . levETIRAcetam  500 mg Oral BID  . LORazepam  0-4 mg Intravenous Q6H   Followed by  . [START ON 09/17/2020] LORazepam  0-4 mg Intravenous Q12H  . multivitamin with minerals  1 tablet Oral Daily  . pantoprazole  40 mg Oral Q1200  . thiamine  100 mg Oral Daily   Or  . thiamine  100 mg Intravenous Daily   Continuous Infusions: . sodium chloride    . 0.9 % NaCl with KCl 20 mEq / L 125 mL/hr at 09/15/20 0802   PRN Meds:.acetaminophen, hydrALAZINE, HYDROcodone-acetaminophen, HYDROmorphone (DILAUDID) injection, LORazepam **OR** LORazepam, ondansetron (ZOFRAN) IV      Subjective:   Exzavier Murtha was seen and examined today.  No acute complaints, wife at the bedside.  No headache, chest pain or blurred vision.  No acute focal weakness.  Objective:   Vitals:   09/15/20 0800 09/15/20 0806 09/15/20 0815 09/15/20 1017  BP: 128/71 (!) 109/55 (!) 110/59 117/66  Pulse: 88 70 (!) 56 (!) 56  Resp:  14 15 16   Temp:    97.8 F (36.6 C)  TempSrc:    Oral  SpO2: 94% 93% 93% 96%  Weight:      Height:       No intake or output data in the 24 hours ending 09/15/20 1054   Wt Readings from Last 3 Encounters:  09/14/20 98.9 kg  11/28/16 101.6 kg  11/12/16 101.7 kg     Exam  General: Alert and oriented x 3, NAD  Cardiovascular: S1 S2 auscultated, no murmurs, RRR  Respiratory: Clear to auscultation bilaterally, no wheezing, rales or rhonchi  Gastrointestinal: Soft, nontender, nondistended, + bowel sounds  Ext: no pedal edema bilaterally  Neuro: LLE 4/5, RLE 5/5, upper extremities 5/5 strength  Musculoskeletal: No digital cyanosis, clubbing  Skin: No rashes  Psych: Normal affect and demeanor, alert and oriented x3    Data Reviewed:  I have personally reviewed following labs and imaging studies  Micro Results Recent Results (from the past 240 hour(s))  Resp Panel by RT-PCR (Flu A&B, Covid) Nasopharyngeal Swab     Status: None   Collection  Time: 09/14/20  9:23 PM   Specimen: Nasopharyngeal Swab; Nasopharyngeal(NP) swabs in vial transport medium  Result Value Ref Range Status   SARS Coronavirus 2 by RT PCR NEGATIVE NEGATIVE Final    Comment: (NOTE) SARS-CoV-2 target nucleic acids are NOT DETECTED.  The SARS-CoV-2 RNA is generally detectable in upper respiratory specimens during the acute phase of infection. The lowest concentration of SARS-CoV-2 viral copies this assay can detect is 138 copies/mL. A negative result does not preclude SARS-Cov-2 infection and should not be used as the sole basis  for treatment or other patient management decisions. A negative result may occur with  improper specimen collection/handling, submission of specimen other than nasopharyngeal swab, presence of viral mutation(s) within the areas targeted by this assay, and inadequate number of viral copies(<138 copies/mL). A negative result must be combined with clinical observations, patient history, and epidemiological information. The expected result is Negative.  Fact Sheet for Patients:  EntrepreneurPulse.com.au  Fact Sheet for Healthcare Providers:  IncredibleEmployment.be  This test is no t yet approved or cleared by the [place] FDA and  has been authorized for detection and/or diagnosis of SARS-CoV-2 by FDA under an Emergency Use Authorization (EUA). This EUA will remain  in effect (meaning this test can be used) for the duration of the COVID-19 declaration under Section 564(b)(1) of the Act, 21 U.S.C.section 360bbb-3(b)(1), unless the authorization is terminated  or revoked sooner.       Influenza A by PCR NEGATIVE NEGATIVE Final   Influenza B by PCR NEGATIVE NEGATIVE Final    Comment: (NOTE) The Xpert Xpress SARS-CoV-2/FLU/RSV plus assay is intended as an aid in the diagnosis of influenza from Nasopharyngeal swab specimens and should not be used as a sole basis for treatment. Nasal washings  and aspirates are unacceptable for Xpert Xpress SARS-CoV-2/FLU/RSV testing.  Fact Sheet for Patients: EntrepreneurPulse.com.au  Fact Sheet for Healthcare Providers: IncredibleEmployment.be  This test is not yet approved or cleared by the [place] FDA and has been authorized for detection and/or diagnosis of SARS-CoV-2 by FDA under an Emergency Use Authorization (EUA). This EUA will remain in effect (meaning this test can be used) for the duration of the COVID-19 declaration under Section 564(b)(1) of the Act, 21 U.S.C. section 360bbb-3(b)(1), unless the authorization is terminated or revoked.  Performed at [facility], [place] [address] [place], [place] [address]     Radiology Reports CT ANGIO HEAD W OR WO CONTRAST  Result Date: 09/14/2020 CLINICAL DATA:  Neuro deficit, acute, stroke suspected. EXAM: CT ANGIOGRAPHY HEAD AND NECK TECHNIQUE: Multidetector CT imaging of the head and neck was performed using the standard protocol during bolus administration of intravenous contrast. Multiplanar CT image reconstructions and MIPs were obtained to evaluate the vascular anatomy. Carotid stenosis measurements (when applicable) are obtained utilizing NASCET criteria, using the distal internal carotid diameter as the denominator. CONTRAST:  144mL OMNIPAQUE IOHEXOL 300 MG/ML  SOLN COMPARISON:  Head CT September 14, 2020. FINDINGS: CT HEAD FINDINGS Brain: Postcontrast images showed multiple hemorrhagic lesions (series 12), as seen on prior CT: - measuring 17 mm in the right frontal lobe with mild surrounding edema, image 28; - 6 mm in the left frontal lobe, minimal surrounding edema, image 25; - 5 mm in the right parietal lobe, no significant edema, image 23; - 15 mm the left parietal lobe, mild surrounding vasogenic edema, image 23; - 31 mm in the left frontal lobe with surrounding vasogenic edema and effacement of the adjacent cerebral sulci, image 20; - 27  mm in the left occipital lobe, decompressing into the occipital horn of the left lateral ventricle with surrounding vasogenic edema effacing the adjacent cerebral sulci, image 17. In addition to the previously seen hemorrhagic lesions, enhancing 2 small enhancing lesions are seen in the left frontal lobe measuring 2-3 mm each (image 26) and a 6 mm enhancing lesion is seen in the right occipital lobe (image 13). No hydrocephalus. Evaluation for subarachnoid hemorrhage limited due to intravenous contrast. Vascular: See below. Skull: Normal. Negative for fracture or focal lesion. Sinuses:  Imaged portions are clear. Orbits: No acute finding. Review of the MIP images confirms the above findings CTA NECK FINDINGS Aortic arch: Common origin of the innominate and left common carotid arteries from the aortic arch. The left vertebral artery origin is directly from the aortic arch. Imaged portion shows no evidence of aneurysm or dissection. No significant stenosis of the major arch vessel origins. Right carotid system: No evidence of dissection, stenosis (50% or greater) or occlusion. Left carotid system: No evidence of dissection, stenosis (50% or greater) or occlusion. Vertebral arteries: The left vertebral artery origin is directly from the aortic arch and is dominant. No evidence of dissection, stenosis (50% or greater) or occlusion. Skeleton: No aggressive lesion identified. Other neck: Negative. Upper chest: Negative. Review of the MIP images confirms the above findings CTA HEAD FINDINGS Anterior circulation: No significant stenosis, proximal occlusion, aneurysm, or vascular malformation. Posterior circulation: No significant stenosis, proximal occlusion, aneurysm, or vascular malformation. Venous sinuses: As permitted by contrast timing, patent. Review of the MIP images confirms the above findings IMPRESSION: 1. No large vessel occlusion, hemodynamically significant stenosis, or evidence of dissection. 2. Multiple  hemorrhagic lesions are seen in the bilateral cerebral hemispheres as described above. Electronically Signed   By: Pedro Earls M.D.   On: 09/14/2020 22:24   CT ANGIO NECK W OR WO CONTRAST  Result Date: 09/14/2020 CLINICAL DATA:  Neuro deficit, acute, stroke suspected. EXAM: CT ANGIOGRAPHY HEAD AND NECK TECHNIQUE: Multidetector CT imaging of the head and neck was performed using the standard protocol during bolus administration of intravenous contrast. Multiplanar CT image reconstructions and MIPs were obtained to evaluate the vascular anatomy. Carotid stenosis measurements (when applicable) are obtained utilizing NASCET criteria, using the distal internal carotid diameter as the denominator. CONTRAST:  149mL OMNIPAQUE IOHEXOL 300 MG/ML  SOLN COMPARISON:  Head CT September 14, 2020. FINDINGS: CT HEAD FINDINGS Brain: Postcontrast images showed multiple hemorrhagic lesions (series 12), as seen on prior CT: - measuring 17 mm in the right frontal lobe with mild surrounding edema, image 28; - 6 mm in the left frontal lobe, minimal surrounding edema, image 25; - 5 mm in the right parietal lobe, no significant edema, image 23; - 15 mm the left parietal lobe, mild surrounding vasogenic edema, image 23; - 31 mm in the left frontal lobe with surrounding vasogenic edema and effacement of the adjacent cerebral sulci, image 20; - 27 mm in the left occipital lobe, decompressing into the occipital horn of the left lateral ventricle with surrounding vasogenic edema effacing the adjacent cerebral sulci, image 17. In addition to the previously seen hemorrhagic lesions, enhancing 2 small enhancing lesions are seen in the left frontal lobe measuring 2-3 mm each (image 26) and a 6 mm enhancing lesion is seen in the right occipital lobe (image 13). No hydrocephalus. Evaluation for subarachnoid hemorrhage limited due to intravenous contrast. Vascular: See below. Skull: Normal. Negative for fracture or focal lesion.  Sinuses: Imaged portions are clear. Orbits: No acute finding. Review of the MIP images confirms the above findings CTA NECK FINDINGS Aortic arch: Common origin of the innominate and left common carotid arteries from the aortic arch. The left vertebral artery origin is directly from the aortic arch. Imaged portion shows no evidence of aneurysm or dissection. No significant stenosis of the major arch vessel origins. Right carotid system: No evidence of dissection, stenosis (50% or greater) or occlusion. Left carotid system: No evidence of dissection, stenosis (50% or greater) or occlusion. Vertebral arteries: The  left vertebral artery origin is directly from the aortic arch and is dominant. No evidence of dissection, stenosis (50% or greater) or occlusion. Skeleton: No aggressive lesion identified. Other neck: Negative. Upper chest: Negative. Review of the MIP images confirms the above findings CTA HEAD FINDINGS Anterior circulation: No significant stenosis, proximal occlusion, aneurysm, or vascular malformation. Posterior circulation: No significant stenosis, proximal occlusion, aneurysm, or vascular malformation. Venous sinuses: As permitted by contrast timing, patent. Review of the MIP images confirms the above findings IMPRESSION: 1. No large vessel occlusion, hemodynamically significant stenosis, or evidence of dissection. 2. Multiple hemorrhagic lesions are seen in the bilateral cerebral hemispheres as described above. Electronically Signed   By: Baldemar Lenis M.D.   On: 09/14/2020 22:24   MR ABDOMEN W WO CONTRAST  Result Date: 09/15/2020 CLINICAL DATA:  Metastatic disease, indeterminate RIGHT renal lesion in this 54 year old male found to have intracranial abnormalities and abnormalities on recent CT of the chest, abdomen and pelvis in the abdomen EXAM: MRI ABDOMEN WITHOUT AND WITH CONTRAST TECHNIQUE: Multiplanar multisequence MR imaging of the abdomen was performed both before and after  the administration of intravenous contrast. CONTRAST:  42mL GADAVIST GADOBUTROL 1 MMOL/ML IV SOLN COMPARISON:  CT chest, abdomen and pelvis of September 14, 2020 FINDINGS: Lower chest: Incidental imaging of the lung bases on MRI with limited assessment is unremarkable. No effusion. No consolidative changes. Hepatobiliary: Numerous areas of diffusion related abnormalities in the hepatic parenchyma, for instance on image 97 of series 6 there are 4 distinct lesions 1 in the posterior RIGHT hepatic lobe likely corresponding to 1 of the areas seen on CT and 2 in the medial RIGHT hepatic lobe near the IVC confluence. Assessment of diffusion weighted images shows greater than 20 scattered foci of subtle restricted diffusion throughout the liver, subtle based on size There is a tiny focus of restricted diffusion along the margin of the posterior RIGHT hemi liver which may be extrahepatic (image 111, series 8) this measures approximately 5 mm. For reference the largest area of signal abnormality in the liver measures 11 mm on image 96 of series 8. The rest of these abnormalities are less than a cm and most within the 3-4 mm size range. The largest area of diffusion related signal abnormality shows peripheral enhancement and signs of washout measuring 16 mm (image 36, series 18) greatest axial dimension on the contrasted images. Other tiny foci are scattered throughout the liver showing enhancement though not as well seen is on diffusion related images. Scattered tiny foci show intrinsic T1 hyperintensity. No signs of biliary duct dilation or pericholecystic stranding. Pancreas: Normal intrinsic T1 signal. No ductal dilation or sign of inflammation. Spleen:  Normal size spleen without focal lesion. Adrenals/Urinary Tract:  Adrenal glands are normal. Symmetric renal enhancement. No hydronephrosis. No suspicious renal lesion. The area of concern in the RIGHT upper pole discussed on the previous CT evaluation is not visible on  MRI showing subtle wedge-shaped characteristics but slightly more rounded than expected for infarct or pyelonephritis though potentially related focal pyelonephritis given that it is not visible on the current exam. Subtle stranding is noted in this location adjacent to the kidney. Stomach/Bowel: No acute gastrointestinal process to the extent evaluated. Note that there is limited assessment of the gastrointestinal tract on MRI not performed for bowel evaluation. Vascular/Lymphatic: Patent abdominal vasculature. There is no gastrohepatic or hepatoduodenal ligament lymphadenopathy. No retroperitoneal or mesenteric lymphadenopathy. Other: Trace subtle indistinct appearance of the RIGHT posterior hepatic lobe. Area of  restricted diffusion along the inferior RIGHT hepatic margin shows intrinsic T1 hyperintensity. Musculoskeletal: No suspicious bone lesions identified. IMPRESSION: Multifocal metastatic disease in the liver with intrinsic T1 hyperintensity, raising the question of metastatic melanoma based on signal characteristics, small size and multiplicity. Other differential considerations would include carcinoid tumor given the appearance on diffusion-weighted imaging. Area of concern in the RIGHT kidney is not visualized on the current exam. Suggest close attention on follow-up to this location. Perhaps this represented a small area of focal nephritis. Could be that this is isointense to parenchyma on baseline images to slightly hyperintense and does not separated cell from the surrounding cortex well on post-contrast images though it cannot be seen on other sequences either on today's study. Hepatic steatosis. Electronically Signed   By: Zetta Bills M.D.   On: 09/15/2020 10:01   CT CHEST ABDOMEN PELVIS W CONTRAST  Result Date: 09/14/2020 CLINICAL DATA:  Intracranial metastases, no known primary malignancy EXAM: CT CHEST, ABDOMEN, AND PELVIS WITH CONTRAST TECHNIQUE: Multidetector CT imaging of the chest,  abdomen and pelvis was performed following the standard protocol during bolus administration of intravenous contrast. CONTRAST:  123mL OMNIPAQUE IOHEXOL 300 MG/ML  SOLN COMPARISON:  03/13/2015 FINDINGS: CT CHEST FINDINGS Cardiovascular: The heart and great vessels are unremarkable without pericardial effusion. No evidence of aortic aneurysm or dissection. Mediastinum/Nodes: No enlarged mediastinal, hilar, or axillary lymph nodes. Thyroid gland, trachea, and esophagus demonstrate no significant findings. Lungs/Pleura: Pulmonary nodules are as follows: 1.4 x 1.0 cm right upper lobe subpleural nodule image 51/6. 0.5 cm right lower lobe pulmonary nodule image 82/6. No airspace disease, effusion, or pneumothorax. The central airways are patent. Musculoskeletal: No acute or destructive bony lesions. Reconstructed images demonstrate no additional findings. CT ABDOMEN PELVIS FINDINGS Hepatobiliary: Multiple small hypodensities are seen within the liver parenchyma, too small to characterize but suspicious for metastatic disease given the intracranial findings. Hypodensities are seen within the right lobe liver image 46 and 49, measuring up to 11 mm in size. Hypodensity within the inferior margin of the right lobe liver image 76 measures 13 mm. No intrahepatic duct dilation. The gallbladder is unremarkable. Pancreas: Unremarkable. No pancreatic ductal dilatation or surrounding inflammatory changes. Spleen: Normal in size without focal abnormality. Adrenals/Urinary Tract: 1.6 cm hypodensity upper pole right kidney measures 23 Hounsfield units image 66/5, indeterminate. This is new since prior CT. The remainder of the kidneys enhance normally. No urinary tract calculi or obstruction. The adrenals and bladder are unremarkable. Stomach/Bowel: No bowel obstruction or ileus. Normal appendix right lower quadrant. No bowel wall thickening or inflammatory change. Vascular/Lymphatic: No significant vascular findings are present. No  enlarged abdominal or pelvic lymph nodes. Reproductive: Prostate is unremarkable. Other: No free fluid or free gas. No abdominal wall hernia. Musculoskeletal: No acute or destructive bony lesions. Reconstructed images demonstrate no additional findings. IMPRESSION: 1. A 1.4 x 1.0 cm right upper lobe subpleural nodule, which could reflect primary or metastatic disease. PET-CT may be useful for follow-up. The 0.5 cm right lower lobe nodule be too small to characterize by PET scan, and will require serial follow-up. 2. Multiple small hypodensities within the liver parenchyma, suspicious for liver metastases. 3. A 1.6 cm hypodensity upper pole right kidney, indeterminate. This could be further evaluated with MRI without and with contrast. Electronically Signed   By: Randa Ngo M.D.   On: 09/14/2020 22:15   CT HEAD CODE STROKE WO CONTRAST  Result Date: 09/14/2020 CLINICAL DATA:  Code stroke.  Acute neuro deficit.  Vomiting. EXAM:  CT HEAD WITHOUT CONTRAST TECHNIQUE: Contiguous axial images were obtained from the base of the skull through the vertex without intravenous contrast. COMPARISON:  None. FINDINGS: Brain: Multiple areas of hemorrhage in the brain. Many these are associated with mass lesions and edema. Appearance is most consistent with hemorrhagic metastasis. Largest lesion in the left frontal lobe measures 2.8 cm in diameter with mild hemorrhage and mild to moderate edema. Left occipital hemorrhage with surrounding edema measures 2.5 cm. Multiple additional smaller areas of hemorrhage are present in the frontal and parietal lobes bilaterally and in the right occipital lobe. Ventricle size normal. Mild midline shift to the right of approximately 3 mm. Vascular: Negative for hyperdense vessel Skull: No skeletal lesion identified in the calvarium Sinuses/Orbits: Paranasal sinuses clear.  Negative orbit. Other: None IMPRESSION: 1. 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  Mild midline shift to the right. 2. These results were called by telephone at the time of interpretation on 09/14/2020 at 9:44 pm to provider Rory Percy , who verbally acknowledged these results. Electronically Signed   By: Franchot Gallo M.D.   On: 09/14/2020 21:45    Lab Data:  CBC: Recent Labs  Lab 09/14/20 1042 09/14/20 2144 09/14/20 2148 09/15/20 0326  WBC 15.1* 18.6*  --  16.5*  NEUTROABS  --  15.8*  --   --   HGB 16.6 16.8 16.7 17.2*  HCT 49.2 46.1 49.0 49.7  MCV 91.6 89.2  --  90.9  PLT 304 325  --  [id number]   Basic Metabolic Panel: Recent Labs  Lab 09/14/20 1042 09/14/20 2144 09/14/20 2148 09/15/20 0430  NA 134* 131* 134* 138  K 4.3 3.4* 3.5 3.7  CL 100 97* 97* 101  CO2 20* 20*  --  23  GLUCOSE 136* 140* 139* 135*  BUN 19 16 17 13   CREATININE 0.85 0.83 0.60* 0.91  CALCIUM 9.2 9.1  --  9.4   GFR: Estimated Creatinine Clearance: 109.6 mL/min (by C-G formula based on SCr of 0.91 mg/dL). Liver Function Tests: Recent Labs  Lab 09/14/20 2144 09/15/20 0430  AST 64* 82*  ALT 102* 141*  ALKPHOS 108 113  BILITOT 1.4* 1.6*  PROT 7.5 7.5  ALBUMIN 4.1 4.0   No results for input(s): LIPASE, AMYLASE in the last 168 hours. No results for input(s): AMMONIA in the last 168 hours. Coagulation Profile: Recent Labs  Lab 09/14/20 2144  INR 1.1   Cardiac Enzymes: No results for input(s): CKTOTAL, CKMB, CKMBINDEX, TROPONINI in the last 168 hours. BNP (last 3 results) No results for input(s): PROBNP in the last 8760 hours. HbA1C: No results for input(s): HGBA1C in the last 72 hours. CBG: Recent Labs  Lab 09/14/20 2122  GLUCAP 134*   Lipid Profile: No results for input(s): CHOL, HDL, LDLCALC, TRIG, CHOLHDL, LDLDIRECT in the last 72 hours. Thyroid Function Tests: No results for input(s): TSH, T4TOTAL, FREET4, T3FREE, THYROIDAB in the last 72 hours. Anemia Panel: No results for input(s): VITAMINB12, FOLATE, FERRITIN, TIBC, IRON, RETICCTPCT in the last 72 hours. Urine  analysis:    Component Value Date/Time   COLORURINE YELLOW 09/15/2020 0155   APPEARANCEUR CLEAR 09/15/2020 0155   LABSPEC 1.006 09/15/2020 0155   PHURINE 7.0 09/15/2020 0155   GLUCOSEU NEGATIVE 09/15/2020 0155   HGBUR MODERATE (A) 09/15/2020 0155   BILIRUBINUR NEGATIVE 09/15/2020 0155   KETONESUR NEGATIVE 09/15/2020 0155   PROTEINUR NEGATIVE 09/15/2020 0155   NITRITE NEGATIVE 09/15/2020 0155   LEUKOCYTESUR NEGATIVE 09/15/2020 0155  Estill Cotta M.D. [facility] 09/15/2020, 10:54 AM   Call night coverage person covering after 7pm

## 2020-09-15 NOTE — Consult Note (Addendum)
New Hematology/Oncology Consult   Requesting MD: Thad Ranger        Reason for Consult: Multiple hemorrhagic brain metastases  HPI: Sean [name] presented to the emergency room yesterday with left-sided weakness.  He collapsed and had urinary incontinence when trying to get something out of the refrigerator yesterday.  His [name] reports he had a headache beginning on 09/09/2020 and also fever/chills.  She reports he had a negative COVID-19 test.  He had diplopia while in the emergency room.  A CT of the brain revealed multiple hemorrhagic brain lesions in the bilateral cerebral hemispheres some with associated edema.  CTs of the chest, abdomen, and pelvis revealed a 1.4 cm right upper lobe subpleural nodule, a 0.5 cm right lower lobe nodule, multiple small hypodensities in the liver suspicious for metastases, and a 1.6 cm hypodensity in the upper pole of the right kidney.  Sean [name] reports developing headaches and nausea/vomiting beginning the day after Christmas. He felt that he had the "flu ". He had a low-grade fever and "sweats "at night. He has noted weakness of the left leg.    Past Medical History:  Diagnosis Date  . Hyperlipidemia   :  Past Surgical History:  Procedure Laterality Date  . HERNIA REPAIR    :   Current Facility-Administered Medications:  .  0.9 %  sodium chloride infusion, 500 mL, Intravenous, Continuous, Danis, Sean L III, MD .  0.9 % NaCl with KCl 20 mEq/ L  infusion, , Intravenous, Continuous, Opyd, Sean Neri, MD, Last Rate: 125 mL/hr at 09/15/20 0802, New Bag at 09/15/20 0802 .  acetaminophen (TYLENOL) tablet 650 mg, 650 mg, Oral, Q6H PRN, Eduard Clos, MD .  dexamethasone (DECADRON) injection 4 mg, 4 mg, Intravenous, Q6H, Eduard Clos, MD, 4 mg at 09/15/20 6967 .  folic acid (FOLVITE) tablet 1 mg, 1 mg, Oral, Daily, Opyd, Sean S, MD .  hydrALAZINE (APRESOLINE) injection 10 mg, 10 mg, Intravenous, Q4H PRN, Eduard Clos, MD .   HYDROcodone-acetaminophen (NORCO/VICODIN) 5-325 MG per tablet 1-2 tablet, 1-2 tablet, Oral, Q4H PRN, Eduard Clos, MD, 1 tablet at 09/15/20 0242 .  HYDROmorphone (DILAUDID) injection 1 mg, 1 mg, Intravenous, Q3H PRN, Opyd, Sean Neri, MD, 1 mg at 09/15/20 0549 .  levETIRAcetam (KEPPRA) tablet 500 mg, 500 mg, Oral, BID, Eduard Clos, MD .  LORazepam (ATIVAN) injection 0-4 mg, 0-4 mg, Intravenous, Q6H **FOLLOWED BY** [START ON 09/17/2020] LORazepam (ATIVAN) injection 0-4 mg, 0-4 mg, Intravenous, Q12H, Opyd, Sean S, MD .  LORazepam (ATIVAN) tablet 1-4 mg, 1-4 mg, Oral, Q1H PRN **OR** LORazepam (ATIVAN) injection 1-4 mg, 1-4 mg, Intravenous, Q1H PRN, Opyd, Sean S, MD .  multivitamin with minerals tablet 1 tablet, 1 tablet, Oral, Daily, Opyd, Sean S, MD .  ondansetron (ZOFRAN) injection 4 mg, 4 mg, Intravenous, Q6H PRN, Eduard Clos, MD, 4 mg at 09/15/20 0451 .  pantoprazole (PROTONIX) EC tablet 40 mg, 40 mg, Oral, Q1200, Eduard Clos, MD .  thiamine tablet 100 mg, 100 mg, Oral, Daily **OR** thiamine (B-1) injection 100 mg, 100 mg, Intravenous, Daily, Opyd, Sean Neri, MD  Current Outpatient Medications:  .  triamcinolone cream (KENALOG) 0.1 %, Apply 1 application topically 2 (two) times daily. (Patient taking differently: Apply 1 application topically as needed. ), Disp: 30 g, Rfl: 0:  . dexamethasone (DECADRON) injection  4 mg Intravenous Q6H  . folic acid  1 mg Oral Daily  . levETIRAcetam  500 mg Oral BID  .  LORazepam  0-4 mg Intravenous Q6H   Followed by  . [START ON 09/17/2020] LORazepam  0-4 mg Intravenous Q12H  . multivitamin with minerals  1 tablet Oral Daily  . pantoprazole  40 mg Oral Q1200  . thiamine  100 mg Oral Daily   Or  . thiamine  100 mg Intravenous Daily  :  No Known Allergies:   SOCIAL HISTORY: [name] in [place]. He works as a Chief Executive Officer for [employer]. No current cigarette use, he quit  cigarette 17 years ago, 4-5 drinks (wine) each evening. No transfusion history. No risk factor for HIV or hepatitis.  Review of Systems:  Positives include: Headaches, "blurred "vision, nausea/vomiting, left leg weakness, night sweats  A complete ROS was otherwise negative.   Physical Exam:  Blood pressure (!) 110/59, pulse (!) 56, temperature 98.2 F (36.8 C), temperature source Oral, resp. rate 15, height 5' 11.5" (1.816 m), weight 218 lb (98.9 kg), SpO2 93 %.  HEENT: Neck without mass, oropharynx without visible mass Lungs: Clear bilaterally Cardiac: Regular rate and rhythm Abdomen: No mass, nontender, no hepatosplenomegaly GU: Circumcised male, testes without mass Vascular: No leg edema Lymph nodes: No cervical, supraclavicular, axillary, or inguinal nodes Neurologic: Alert and oriented, follows commands, the motor exam appears intact in the upper and lower extremities bilaterally aside from 4/5 strength with flexion/extension at the left foot and left leg raise, slight disconjugate gaze without diplopia, partial left 6th nerve palsy? Skin: No rash, flat 1 cm area of brown hyperpigmentation at the plantar surface of the right foot, no other suspicious skin lesions Musculoskeletal: No spine tenderness  LABS:  Recent Labs    09/14/20 2144 09/14/20 2148 09/15/20 0326  WBC 18.6*  --  16.5*  HGB 16.8 16.7 17.2*  HCT 46.1 49.0 49.7  PLT 325  --  284    Recent Labs    09/14/20 2144 09/14/20 2148 09/15/20 0430  NA 131* 134* 138  K 3.4* 3.5 3.7  CL 97* 97* 101  CO2 20*  --  23  GLUCOSE 140* 139* 135*  BUN 16 17 13   CREATININE 0.83 0.60* 0.91  CALCIUM 9.1  --  9.4      RADIOLOGY:  CT ANGIO HEAD W OR WO CONTRAST  Result Date: 09/14/2020 CLINICAL DATA:  Neuro deficit, acute, stroke suspected. EXAM: CT ANGIOGRAPHY HEAD AND NECK TECHNIQUE: Multidetector CT imaging of the head and neck was performed using the standard protocol during bolus administration of  intravenous contrast. Multiplanar CT image reconstructions and MIPs were obtained to evaluate the vascular anatomy. Carotid stenosis measurements (when applicable) are obtained utilizing NASCET criteria, using the distal internal carotid diameter as the denominator. CONTRAST:  171mL OMNIPAQUE IOHEXOL 300 MG/ML  SOLN COMPARISON:  Head CT September 14, 2020. FINDINGS: CT HEAD FINDINGS Brain: Postcontrast images showed multiple hemorrhagic lesions (series 12), as seen on prior CT: - measuring 17 mm in the right frontal lobe with mild surrounding edema, image 28; - 6 mm in the left frontal lobe, minimal surrounding edema, image 25; - 5 mm in the right parietal lobe, no significant edema, image 23; - 15 mm the left parietal lobe, mild surrounding vasogenic edema, image 23; - 31 mm in the left frontal lobe with surrounding vasogenic edema and effacement of the adjacent cerebral sulci, image 20; - 27 mm in the left occipital lobe, decompressing into the occipital horn of the left lateral ventricle with surrounding vasogenic edema effacing the adjacent  cerebral sulci, image 17. In addition to the previously seen hemorrhagic lesions, enhancing 2 small enhancing lesions are seen in the left frontal lobe measuring 2-3 mm each (image 26) and a 6 mm enhancing lesion is seen in the right occipital lobe (image 13). No hydrocephalus. Evaluation for subarachnoid hemorrhage limited due to intravenous contrast. Vascular: See below. Skull: Normal. Negative for fracture or focal lesion. Sinuses: Imaged portions are clear. Orbits: No acute finding. Review of the MIP images confirms the above findings CTA NECK FINDINGS Aortic arch: Common origin of the innominate and left common carotid arteries from the aortic arch. The left vertebral artery origin is directly from the aortic arch. Imaged portion shows no evidence of aneurysm or dissection. No significant stenosis of the major arch vessel origins. Right carotid system: No evidence of  dissection, stenosis (50% or greater) or occlusion. Left carotid system: No evidence of dissection, stenosis (50% or greater) or occlusion. Vertebral arteries: The left vertebral artery origin is directly from the aortic arch and is dominant. No evidence of dissection, stenosis (50% or greater) or occlusion. Skeleton: No aggressive lesion identified. Other neck: Negative. Upper chest: Negative. Review of the MIP images confirms the above findings CTA HEAD FINDINGS Anterior circulation: No significant stenosis, proximal occlusion, aneurysm, or vascular malformation. Posterior circulation: No significant stenosis, proximal occlusion, aneurysm, or vascular malformation. Venous sinuses: As permitted by contrast timing, patent. Review of the MIP images confirms the above findings IMPRESSION: 1. No large vessel occlusion, hemodynamically significant stenosis, or evidence of dissection. 2. Multiple hemorrhagic lesions are seen in the bilateral cerebral hemispheres as described above. Electronically Signed   By: Pedro Earls M.D.   On: 09/14/2020 22:24   CT ANGIO NECK W OR WO CONTRAST  Result Date: 09/14/2020 CLINICAL DATA:  Neuro deficit, acute, stroke suspected. EXAM: CT ANGIOGRAPHY HEAD AND NECK TECHNIQUE: Multidetector CT imaging of the head and neck was performed using the standard protocol during bolus administration of intravenous contrast. Multiplanar CT image reconstructions and MIPs were obtained to evaluate the vascular anatomy. Carotid stenosis measurements (when applicable) are obtained utilizing NASCET criteria, using the distal internal carotid diameter as the denominator. CONTRAST:  178mL OMNIPAQUE IOHEXOL 300 MG/ML  SOLN COMPARISON:  Head CT September 14, 2020. FINDINGS: CT HEAD FINDINGS Brain: Postcontrast images showed multiple hemorrhagic lesions (series 12), as seen on prior CT: - measuring 17 mm in the right frontal lobe with mild surrounding edema, image 28; - 6 mm in the left  frontal lobe, minimal surrounding edema, image 25; - 5 mm in the right parietal lobe, no significant edema, image 23; - 15 mm the left parietal lobe, mild surrounding vasogenic edema, image 23; - 31 mm in the left frontal lobe with surrounding vasogenic edema and effacement of the adjacent cerebral sulci, image 20; - 27 mm in the left occipital lobe, decompressing into the occipital horn of the left lateral ventricle with surrounding vasogenic edema effacing the adjacent cerebral sulci, image 17. In addition to the previously seen hemorrhagic lesions, enhancing 2 small enhancing lesions are seen in the left frontal lobe measuring 2-3 mm each (image 26) and a 6 mm enhancing lesion is seen in the right occipital lobe (image 13). No hydrocephalus. Evaluation for subarachnoid hemorrhage limited due to intravenous contrast. Vascular: See below. Skull: Normal. Negative for fracture or focal lesion. Sinuses: Imaged portions are clear. Orbits: No acute finding. Review of the MIP images confirms the above findings CTA NECK FINDINGS Aortic arch: Common origin of the  innominate and left common carotid arteries from the aortic arch. The left vertebral artery origin is directly from the aortic arch. Imaged portion shows no evidence of aneurysm or dissection. No significant stenosis of the major arch vessel origins. Right carotid system: No evidence of dissection, stenosis (50% or greater) or occlusion. Left carotid system: No evidence of dissection, stenosis (50% or greater) or occlusion. Vertebral arteries: The left vertebral artery origin is directly from the aortic arch and is dominant. No evidence of dissection, stenosis (50% or greater) or occlusion. Skeleton: No aggressive lesion identified. Other neck: Negative. Upper chest: Negative. Review of the MIP images confirms the above findings CTA HEAD FINDINGS Anterior circulation: No significant stenosis, proximal occlusion, aneurysm, or vascular malformation. Posterior  circulation: No significant stenosis, proximal occlusion, aneurysm, or vascular malformation. Venous sinuses: As permitted by contrast timing, patent. Review of the MIP images confirms the above findings IMPRESSION: 1. No large vessel occlusion, hemodynamically significant stenosis, or evidence of dissection. 2. Multiple hemorrhagic lesions are seen in the bilateral cerebral hemispheres as described above. Electronically Signed   By: Pedro Earls M.D.   On: 09/14/2020 22:24   CT CHEST ABDOMEN PELVIS W CONTRAST  Result Date: 09/14/2020 CLINICAL DATA:  Intracranial metastases, no known primary malignancy EXAM: CT CHEST, ABDOMEN, AND PELVIS WITH CONTRAST TECHNIQUE: Multidetector CT imaging of the chest, abdomen and pelvis was performed following the standard protocol during bolus administration of intravenous contrast. CONTRAST:  184mL OMNIPAQUE IOHEXOL 300 MG/ML  SOLN COMPARISON:  03/13/2015 FINDINGS: CT CHEST FINDINGS Cardiovascular: The heart and great vessels are unremarkable without pericardial effusion. No evidence of aortic aneurysm or dissection. Mediastinum/Nodes: No enlarged mediastinal, hilar, or axillary lymph nodes. Thyroid gland, trachea, and esophagus demonstrate no significant findings. Lungs/Pleura: Pulmonary nodules are as follows: 1.4 x 1.0 cm right upper lobe subpleural nodule image 51/6. 0.5 cm right lower lobe pulmonary nodule image 82/6. No airspace disease, effusion, or pneumothorax. The central airways are patent. Musculoskeletal: No acute or destructive bony lesions. Reconstructed images demonstrate no additional findings. CT ABDOMEN PELVIS FINDINGS Hepatobiliary: Multiple small hypodensities are seen within the liver parenchyma, too small to characterize but suspicious for metastatic disease given the intracranial findings. Hypodensities are seen within the right lobe liver image 46 and 49, measuring up to 11 mm in size. Hypodensity within the inferior margin of the  right lobe liver image 76 measures 13 mm. No intrahepatic duct dilation. The gallbladder is unremarkable. Pancreas: Unremarkable. No pancreatic ductal dilatation or surrounding inflammatory changes. Spleen: Normal in size without focal abnormality. Adrenals/Urinary Tract: 1.6 cm hypodensity upper pole right kidney measures 23 Hounsfield units image 66/5, indeterminate. This is new since prior CT. The remainder of the kidneys enhance normally. No urinary tract calculi or obstruction. The adrenals and bladder are unremarkable. Stomach/Bowel: No bowel obstruction or ileus. Normal appendix right lower quadrant. No bowel wall thickening or inflammatory change. Vascular/Lymphatic: No significant vascular findings are present. No enlarged abdominal or pelvic lymph nodes. Reproductive: Prostate is unremarkable. Other: No free fluid or free gas. No abdominal wall hernia. Musculoskeletal: No acute or destructive bony lesions. Reconstructed images demonstrate no additional findings. IMPRESSION: 1. A 1.4 x 1.0 cm right upper lobe subpleural nodule, which could reflect primary or metastatic disease. PET-CT may be useful for follow-up. The 0.5 cm right lower lobe nodule be too small to characterize by PET scan, and will require serial follow-up. 2. Multiple small hypodensities within the liver parenchyma, suspicious for liver metastases. 3. A 1.6 cm hypodensity  upper pole right kidney, indeterminate. This could be further evaluated with MRI without and with contrast. Electronically Signed   By: Randa Ngo M.D.   On: 09/14/2020 22:15   CT HEAD CODE STROKE WO CONTRAST  Result Date: 09/14/2020 CLINICAL DATA:  Code stroke.  Acute neuro deficit.  Vomiting. EXAM: CT HEAD WITHOUT CONTRAST TECHNIQUE: Contiguous axial images were obtained from the base of the skull through the vertex without intravenous contrast. COMPARISON:  None. FINDINGS: Brain: Multiple areas of hemorrhage in the brain. Many these are associated with mass  lesions and edema. Appearance is most consistent with hemorrhagic metastasis. Largest lesion in the left frontal lobe measures 2.8 cm in diameter with mild hemorrhage and mild to moderate edema. Left occipital hemorrhage with surrounding edema measures 2.5 cm. Multiple additional smaller areas of hemorrhage are present in the frontal and parietal lobes bilaterally and in the right occipital lobe. Ventricle size normal. Mild midline shift to the right of approximately 3 mm. Vascular: Negative for hyperdense vessel Skull: No skeletal lesion identified in the calvarium Sinuses/Orbits: Paranasal sinuses clear.  Negative orbit. Other: None IMPRESSION: 1. 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 Mild midline shift to the right. 2. These results were called by telephone at the time of interpretation on 09/14/2020 at 9:44 pm to provider Rory Percy , who verbally acknowledged these results. Electronically Signed   By: Franchot Gallo M.D.   On: 09/14/2020 21:45   CT images reviewed  Assessment and Plan:   1.  Multiple brain metastases, unknown primary tumor site  CT brain 09/14/2020-multiple hemorrhagic metastases with surrounding edema in the bilateral cerebral hemispheres  CTs chest, abdomen, pelvis 09/14/2020-1.4 cm right upper lobe subpleural nodule, 0.5 cm right lower lobe nodule, multiple hypodensities in the liver suspicious for metastases, 1.5 cm right upper pole hypodense kidney lesion-indeterminate 2.  Left-sided weakness, new onset seizure secondary to #1 3.  Hyperlipidemia 4.  Elevated liver enzymes and bilirubin 5.  Headache and nausea/vomiting secondary to #1 6. Small flat brown mole at the plantar surface of the right foot   Sean [name] presents with headaches, nausea, and neurologic symptoms. A brain CT is consistent with multiple hemorrhagic metastases. There is no obvious primary tumor site upon review of his history, physical examination, and imaging  studies. I discussed the differential diagnosis with Sean [name] and his [name]. The differential includes lung cancer, renal cell carcinoma or melanoma, and other primary tumor sites. They understand we need to proceed with a diagnostic biopsy in order to establish a diagnosis and treatment plan.  Recommendations: 1.  Follow-up abdominal MRI, consult with interventional radiology regarding an accessible site for a diagnostic biopsy 2.  Continue Decadron and Keppra 3.  Radiation oncology consult-I will call 4.  Please call Oncology as needed over the weekend, I will follow up on the biopsy results and recommend systemic treatment options. Outpatient follow-up will be scheduled at the Cancer center.    Betsy Coder, MD 09/15/2020, 8:35 AM

## 2020-09-15 NOTE — ED Notes (Signed)
Report given to floor Roslynn Amble, RN.

## 2020-09-15 NOTE — Progress Notes (Signed)
EEG Completed; Results Pending  

## 2020-09-15 NOTE — ED Notes (Signed)
EEG at bedside.

## 2020-09-15 NOTE — ED Notes (Signed)
Provider at bedside assessing pt, wife in the room. It was decided between provider & pt he will be NPO in attempt to get his biopsy performed today. If not done, then his diet will resume as ordered.

## 2020-09-15 NOTE — Progress Notes (Signed)
Wife arrived later.  Discussed CT findings. Wife also reports that there was possible witnessed seizure activity. In addition to the antiepileptics as recommended before, also obtain routine EEG in the morning.  -- Milon Dikes, MD [facility] Pager: [phone] If 7pm to 7am, please call on call as listed on AMION.

## 2020-09-15 NOTE — Procedures (Signed)
Patient Name: Sean [name]  MRN: [id number]  Epilepsy Attending: Charlsie Quest  Referring Physician/Provider: Dr Midge Minium Date: 09/15/2020 Duration: 23.25 mins  Patient history: 54yo M with seizure like activity. EEG to evaluate for seizure  Level of alertness: Awake, asleep  AEDs during EEG study: LEV  Technical aspects: This EEG study was done with scalp electrodes positioned according to the 10-20 International system of electrode placement. Electrical activity was acquired at a sampling rate of 500Hz  and reviewed with a high frequency filter of 70Hz  and a low frequency filter of 1Hz . EEG data were recorded continuously and digitally stored.   Description: The posterior dominant rhythm consists of 9 Hz activity of moderate voltage (25-35 uV) seen predominantly in posterior head regions, symmetric and reactive to eye opening and eye closing. Sleep was characterized by vertex waves, sleep spindles (12 to 14 Hz), maximal frontocentral region. Hyperventilation and photic stimulation were not performed.     IMPRESSION: This study is within normal limits. No seizures or epileptiform discharges were seen throughout the recording.  Arrabella Westerman 

## 2020-09-15 NOTE — H&P (Addendum)
History and Physical    Canton Yearby HER:[id number] DOB: [birth year] DOA: 09/14/2020  PCP: Deland Pretty, MD   Patient coming from: Home   Chief Complaint: Left-sided weakness, brief LOC, double vision, N/V   HPI: Sean [name] is a 54 y.o. male who denies any significant past medical history and does not take any medications regularly, now presenting to the emergency department with 1 to 2 weeks of general malaise, more recent left-sided weakness, nausea, vomiting, and a brief loss of consciousness with urinary incontinence reported by EMS. He thought he had a viral illness and had 2 negative COVID tests prior to coming in. Reports smoking 1.5 ppd for 20 years before he quit. Drinks ~5 "generous" glasses of wine every day and has history of mild withdrawal symptoms. Does not know of any family history of cancer.    ED Course: Upon arrival to the ED, patient is found to be afebrile, saturating well on room air, slightly tachycardic, and with blood pressure 156/93. EKG features sinus bradycardia with sinus arrhythmia. Noncontrast head CT is concerning for multiple hemorrhagic metastases with surrounding edema and mild midline shift to the right. Chemistry panel is notable for hyponatremia, hypokalemia, and mild elevation in transaminases and bilirubin. CBC features a leukocytosis to 18,600. UDS negative, ethanol undetectable, urinalysis unremarkable, and COVID-19 PCR negative. CTA head and neck negative for large vessel occlusion or hemodynamically significant stenoses. CT of the chest/abdomen/pelvis reveals right upper lobe subpleural nodule which could reflect a primary malignancy or metastasis, multiple liver lesions concerning for metastases, and indeterminant right renal hypodensity. Neurology evaluated the patient in the emergency department and he was treated with Decadron, Keppra, and Norco.  Review of Systems:  All other systems reviewed and apart from HPI, are negative.  Past Medical  History:  Diagnosis Date  . Hyperlipidemia     Past Surgical History:  Procedure Laterality Date  . HERNIA REPAIR      Social History:   reports that he has quit smoking. He has never used smokeless tobacco. He reports current alcohol use. He reports that he does not use drugs.  No Known Allergies  Family History  Problem Relation Age of Onset  . Osteoarthritis Mother   . Hypertension Father   . Colon polyps Father   . Colon polyps Brother   . Colon polyps Brother   . Colon cancer Neg Hx   . Stomach cancer Neg Hx   . Rectal cancer Neg Hx   . Esophageal cancer Neg Hx   . Liver cancer Neg Hx      Prior to Admission medications   Medication Sig Start Date End Date Taking? Authorizing Provider  triamcinolone cream (KENALOG) 0.1 % Apply 1 application topically 2 (two) times daily. Patient taking differently: Apply 1 application topically as needed.  09/14/16   Melony Overly, MD    Physical Exam: Vitals:   09/14/20 2330 09/15/20 0030 09/15/20 0130 09/15/20 0215  BP: 129/68 (!) 156/93 124/79 (!) 139/92  Pulse: 79 (!) 106 91 81  Resp: (!) 22 (!) _0 Temp:      TempSrc:      SpO2: 97% 95% 97% 96%  Weight:      Height:        Constitutional: NAD, calm  Eyes: PERTLA, lids and conjunctivae normal ENMT: Mucous membranes are moist. Posterior pharynx clear of any exudate or lesions.   Neck: supple, no thyromegaly Respiratory:  no wheezing, no crackles. No accessory muscle use.  Cardiovascular: S1 &  S2 heard, regular rate and rhythm. No extremity edema.   Abdomen: No distension, no tenderness, soft. Bowel sounds active.  Musculoskeletal: no clubbing / cyanosis. No joint deformity upper and lower extremities.   Skin: no significant rashes, lesions, ulcers. Warm, dry, well-perfused. Neurologic: CN 2-12 grossly intact. Sensation intact. Moving all extremities.  Psychiatric: Alert and oriented to person, place, and situation. Very pleasant and cooperative.    Labs and  Imaging on Admission: I have personally reviewed following labs and imaging studies  CBC: Recent Labs  Lab 09/14/20 1042 09/14/20 2144 09/14/20 2148  WBC 15.1* 18.6*  --   NEUTROABS  --  15.8*  --   HGB 16.6 16.8 16.7  HCT 49.2 46.1 49.0  MCV 91.6 89.2  --   PLT 304 325  --    Basic Metabolic Panel: Recent Labs  Lab 09/14/20 1042 09/14/20 2144 09/14/20 2148  NA 134* 131* 134*  K 4.3 3.4* 3.5  CL 100 97* 97*  CO2 20* 20*  --   GLUCOSE 136* 140* 139*  BUN _0 CREATININE 0.85 0.83 0.60*  CALCIUM 9.2 9.1  --    GFR: Estimated Creatinine Clearance: 124.7 mL/min (A) (by C-G formula based on SCr of 0.6 mg/dL (L)). Liver Function Tests: Recent Labs  Lab 09/14/20 2144  AST 64*  ALT 102*  ALKPHOS 108  BILITOT 1.4*  PROT 7.5  ALBUMIN 4.1   No results for input(s): LIPASE, AMYLASE in the last 168 hours. No results for input(s): AMMONIA in the last 168 hours. Coagulation Profile: Recent Labs  Lab 09/14/20 2144  INR 1.1   Cardiac Enzymes: No results for input(s): CKTOTAL, CKMB, CKMBINDEX, TROPONINI in the last 168 hours. BNP (last 3 results) No results for input(s): PROBNP in the last 8760 hours. HbA1C: No results for input(s): HGBA1C in the last 72 hours. CBG: Recent Labs  Lab 09/14/20 2122  GLUCAP 134*   Lipid Profile: No results for input(s): CHOL, HDL, LDLCALC, TRIG, CHOLHDL, LDLDIRECT in the last 72 hours. Thyroid Function Tests: No results for input(s): TSH, T4TOTAL, FREET4, T3FREE, THYROIDAB in the last 72 hours. Anemia Panel: No results for input(s): VITAMINB12, FOLATE, FERRITIN, TIBC, IRON, RETICCTPCT in the last 72 hours. Urine analysis:    Component Value Date/Time   COLORURINE YELLOW 09/15/2020 0155   APPEARANCEUR CLEAR 09/15/2020 0155   LABSPEC 1.006 09/15/2020 0155   PHURINE 7.0 09/15/2020 0155   GLUCOSEU NEGATIVE 09/15/2020 0155   HGBUR MODERATE (A) 09/15/2020 0155   BILIRUBINUR NEGATIVE 09/15/2020 0155   KETONESUR NEGATIVE  09/15/2020 0155   PROTEINUR NEGATIVE 09/15/2020 0155   NITRITE NEGATIVE 09/15/2020 0155   LEUKOCYTESUR NEGATIVE 09/15/2020 0155   Sepsis Labs: _1 (procalcitonin:4,lacticidven:4) ) Recent Results (from the past 240 hour(s))  Resp Panel by RT-PCR (Flu A&B, Covid) Nasopharyngeal Swab     Status: None   Collection Time: 09/14/20  9:23 PM   Specimen: Nasopharyngeal Swab; Nasopharyngeal(NP) swabs in vial transport medium  Result Value Ref Range Status   SARS Coronavirus 2 by RT PCR NEGATIVE NEGATIVE Final    Comment: (NOTE) SARS-CoV-2 target nucleic acids are NOT DETECTED.  The SARS-CoV-2 RNA is generally detectable in upper respiratory specimens during the acute phase of infection. The lowest concentration of SARS-CoV-2 viral copies this assay can detect is 138 copies/mL. A negative result does not preclude SARS-Cov-2 infection and should not be used as the sole basis for treatment or other patient management decisions. A negative result may occur with  improper specimen collection/handling,  submission of specimen other than nasopharyngeal swab, presence of viral mutation(s) within the areas targeted by this assay, and inadequate number of viral copies(<138 copies/mL). A negative result must be combined with clinical observations, patient history, and epidemiological information. The expected result is Negative.  Fact Sheet for Patients:  EntrepreneurPulse.com.au  Fact Sheet for Healthcare Providers:  IncredibleEmployment.be  This test is no t yet approved or cleared by the [place] FDA and  has been authorized for detection and/or diagnosis of SARS-CoV-2 by FDA under an Emergency Use Authorization (EUA). This EUA will remain  in effect (meaning this test can be used) for the duration of the COVID-19 declaration under Section 564(b)(1) of the Act, 21 U.S.C.section 360bbb-3(b)(1), unless the authorization is terminated  or revoked  sooner.       Influenza A by PCR NEGATIVE NEGATIVE Final   Influenza B by PCR NEGATIVE NEGATIVE Final    Comment: (NOTE) The Xpert Xpress SARS-CoV-2/FLU/RSV plus assay is intended as an aid in the diagnosis of influenza from Nasopharyngeal swab specimens and should not be used as a sole basis for treatment. Nasal washings and aspirates are unacceptable for Xpert Xpress SARS-CoV-2/FLU/RSV testing.  Fact Sheet for Patients: EntrepreneurPulse.com.au  Fact Sheet for Healthcare Providers: IncredibleEmployment.be  This test is not yet approved or cleared by the [place] FDA and has been authorized for detection and/or diagnosis of SARS-CoV-2 by FDA under an Emergency Use Authorization (EUA). This EUA will remain in effect (meaning this test can be used) for the duration of the COVID-19 declaration under Section 564(b)(1) of the Act, 21 U.S.C. section 360bbb-3(b)(1), unless the authorization is terminated or revoked.  Performed at [facility], [place] [address] [place], [place] [address]      Radiological Exams on Admission: CT ANGIO HEAD W OR WO CONTRAST  Result Date: 09/14/2020 CLINICAL DATA:  Neuro deficit, acute, stroke suspected. EXAM: CT ANGIOGRAPHY HEAD AND NECK TECHNIQUE: Multidetector CT imaging of the head and neck was performed using the standard protocol during bolus administration of intravenous contrast. Multiplanar CT image reconstructions and MIPs were obtained to evaluate the vascular anatomy. Carotid stenosis measurements (when applicable) are obtained utilizing NASCET criteria, using the distal internal carotid diameter as the denominator. CONTRAST:  179m OMNIPAQUE IOHEXOL 300 MG/ML  SOLN COMPARISON:  Head CT September 14, 2020. FINDINGS: CT HEAD FINDINGS Brain: Postcontrast images showed multiple hemorrhagic lesions (series 12), as seen on prior CT: - measuring 17 mm in the right frontal lobe with mild surrounding edema,  image 28; - 6 mm in the left frontal lobe, minimal surrounding edema, image 25; - 5 mm in the right parietal lobe, no significant edema, image 23; - 15 mm the left parietal lobe, mild surrounding vasogenic edema, image 23; - 31 mm in the left frontal lobe with surrounding vasogenic edema and effacement of the adjacent cerebral sulci, image 20; - 27 mm in the left occipital lobe, decompressing into the occipital horn of the left lateral ventricle with surrounding vasogenic edema effacing the adjacent cerebral sulci, image 17. In addition to the previously seen hemorrhagic lesions, enhancing 2 small enhancing lesions are seen in the left frontal lobe measuring 2-3 mm each (image 26) and a 6 mm enhancing lesion is seen in the right occipital lobe (image 13). No hydrocephalus. Evaluation for subarachnoid hemorrhage limited due to intravenous contrast. Vascular: See below. Skull: Normal. Negative for fracture or focal lesion. Sinuses: Imaged portions are clear. Orbits: No acute finding. Review of the MIP images confirms  the above findings CTA NECK FINDINGS Aortic arch: Common origin of the innominate and left common carotid arteries from the aortic arch. The left vertebral artery origin is directly from the aortic arch. Imaged portion shows no evidence of aneurysm or dissection. No significant stenosis of the major arch vessel origins. Right carotid system: No evidence of dissection, stenosis (50% or greater) or occlusion. Left carotid system: No evidence of dissection, stenosis (50% or greater) or occlusion. Vertebral arteries: The left vertebral artery origin is directly from the aortic arch and is dominant. No evidence of dissection, stenosis (50% or greater) or occlusion. Skeleton: No aggressive lesion identified. Other neck: Negative. Upper chest: Negative. Review of the MIP images confirms the above findings CTA HEAD FINDINGS Anterior circulation: No significant stenosis, proximal occlusion, aneurysm, or vascular  malformation. Posterior circulation: No significant stenosis, proximal occlusion, aneurysm, or vascular malformation. Venous sinuses: As permitted by contrast timing, patent. Review of the MIP images confirms the above findings IMPRESSION: 1. No large vessel occlusion, hemodynamically significant stenosis, or evidence of dissection. 2. Multiple hemorrhagic lesions are seen in the bilateral cerebral hemispheres as described above. Electronically Signed   By: Pedro Earls M.D.   On: 09/14/2020 22:24   CT ANGIO NECK W OR WO CONTRAST  Result Date: 09/14/2020 CLINICAL DATA:  Neuro deficit, acute, stroke suspected. EXAM: CT ANGIOGRAPHY HEAD AND NECK TECHNIQUE: Multidetector CT imaging of the head and neck was performed using the standard protocol during bolus administration of intravenous contrast. Multiplanar CT image reconstructions and MIPs were obtained to evaluate the vascular anatomy. Carotid stenosis measurements (when applicable) are obtained utilizing NASCET criteria, using the distal internal carotid diameter as the denominator. CONTRAST:  129m OMNIPAQUE IOHEXOL 300 MG/ML  SOLN COMPARISON:  Head CT September 14, 2020. FINDINGS: CT HEAD FINDINGS Brain: Postcontrast images showed multiple hemorrhagic lesions (series 12), as seen on prior CT: - measuring 17 mm in the right frontal lobe with mild surrounding edema, image 28; - 6 mm in the left frontal lobe, minimal surrounding edema, image 25; - 5 mm in the right parietal lobe, no significant edema, image 23; - 15 mm the left parietal lobe, mild surrounding vasogenic edema, image 23; - 31 mm in the left frontal lobe with surrounding vasogenic edema and effacement of the adjacent cerebral sulci, image 20; - 27 mm in the left occipital lobe, decompressing into the occipital horn of the left lateral ventricle with surrounding vasogenic edema effacing the adjacent cerebral sulci, image 17. In addition to the previously seen hemorrhagic lesions,  enhancing 2 small enhancing lesions are seen in the left frontal lobe measuring 2-3 mm each (image 26) and a 6 mm enhancing lesion is seen in the right occipital lobe (image 13). No hydrocephalus. Evaluation for subarachnoid hemorrhage limited due to intravenous contrast. Vascular: See below. Skull: Normal. Negative for fracture or focal lesion. Sinuses: Imaged portions are clear. Orbits: No acute finding. Review of the MIP images confirms the above findings CTA NECK FINDINGS Aortic arch: Common origin of the innominate and left common carotid arteries from the aortic arch. The left vertebral artery origin is directly from the aortic arch. Imaged portion shows no evidence of aneurysm or dissection. No significant stenosis of the major arch vessel origins. Right carotid system: No evidence of dissection, stenosis (50% or greater) or occlusion. Left carotid system: No evidence of dissection, stenosis (50% or greater) or occlusion. Vertebral arteries: The left vertebral artery origin is directly from the aortic arch and is dominant. No  evidence of dissection, stenosis (50% or greater) or occlusion. Skeleton: No aggressive lesion identified. Other neck: Negative. Upper chest: Negative. Review of the MIP images confirms the above findings CTA HEAD FINDINGS Anterior circulation: No significant stenosis, proximal occlusion, aneurysm, or vascular malformation. Posterior circulation: No significant stenosis, proximal occlusion, aneurysm, or vascular malformation. Venous sinuses: As permitted by contrast timing, patent. Review of the MIP images confirms the above findings IMPRESSION: 1. No large vessel occlusion, hemodynamically significant stenosis, or evidence of dissection. 2. Multiple hemorrhagic lesions are seen in the bilateral cerebral hemispheres as described above. Electronically Signed   By: Pedro Earls M.D.   On: 09/14/2020 22:24   CT CHEST ABDOMEN PELVIS W CONTRAST  Result Date:  09/14/2020 CLINICAL DATA:  Intracranial metastases, no known primary malignancy EXAM: CT CHEST, ABDOMEN, AND PELVIS WITH CONTRAST TECHNIQUE: Multidetector CT imaging of the chest, abdomen and pelvis was performed following the standard protocol during bolus administration of intravenous contrast. CONTRAST:  16m OMNIPAQUE IOHEXOL 300 MG/ML  SOLN COMPARISON:  03/13/2015 FINDINGS: CT CHEST FINDINGS Cardiovascular: The heart and great vessels are unremarkable without pericardial effusion. No evidence of aortic aneurysm or dissection. Mediastinum/Nodes: No enlarged mediastinal, hilar, or axillary lymph nodes. Thyroid gland, trachea, and esophagus demonstrate no significant findings. Lungs/Pleura: Pulmonary nodules are as follows: 1.4 x 1.0 cm right upper lobe subpleural nodule image 51/6. 0.5 cm right lower lobe pulmonary nodule image 82/6. No airspace disease, effusion, or pneumothorax. The central airways are patent. Musculoskeletal: No acute or destructive bony lesions. Reconstructed images demonstrate no additional findings. CT ABDOMEN PELVIS FINDINGS Hepatobiliary: Multiple small hypodensities are seen within the liver parenchyma, too small to characterize but suspicious for metastatic disease given the intracranial findings. Hypodensities are seen within the right lobe liver image 46 and 49, measuring up to 11 mm in size. Hypodensity within the inferior margin of the right lobe liver image 76 measures 13 mm. No intrahepatic duct dilation. The gallbladder is unremarkable. Pancreas: Unremarkable. No pancreatic ductal dilatation or surrounding inflammatory changes. Spleen: Normal in size without focal abnormality. Adrenals/Urinary Tract: 1.6 cm hypodensity upper pole right kidney measures 23 Hounsfield units image 66/5, indeterminate. This is new since prior CT. The remainder of the kidneys enhance normally. No urinary tract calculi or obstruction. The adrenals and bladder are unremarkable. Stomach/Bowel: No bowel  obstruction or ileus. Normal appendix right lower quadrant. No bowel wall thickening or inflammatory change. Vascular/Lymphatic: No significant vascular findings are present. No enlarged abdominal or pelvic lymph nodes. Reproductive: Prostate is unremarkable. Other: No free fluid or free gas. No abdominal wall hernia. Musculoskeletal: No acute or destructive bony lesions. Reconstructed images demonstrate no additional findings. IMPRESSION: 1. A 1.4 x 1.0 cm right upper lobe subpleural nodule, which could reflect primary or metastatic disease. PET-CT may be useful for follow-up. The 0.5 cm right lower lobe nodule be too small to characterize by PET scan, and will require serial follow-up. 2. Multiple small hypodensities within the liver parenchyma, suspicious for liver metastases. 3. A 1.6 cm hypodensity upper pole right kidney, indeterminate. This could be further evaluated with MRI without and with contrast. Electronically Signed   By: MRanda NgoM.D.   On: 09/14/2020 22:15   CT HEAD CODE STROKE WO CONTRAST  Result Date: 09/14/2020 CLINICAL DATA:  Code stroke.  Acute neuro deficit.  Vomiting. EXAM: CT HEAD WITHOUT CONTRAST TECHNIQUE: Contiguous axial images were obtained from the base of the skull through the vertex without intravenous contrast. COMPARISON:  None. FINDINGS: Brain: Multiple  areas of hemorrhage in the brain. Many these are associated with mass lesions and edema. Appearance is most consistent with hemorrhagic metastasis. Largest lesion in the left frontal lobe measures 2.8 cm in diameter with mild hemorrhage and mild to moderate edema. Left occipital hemorrhage with surrounding edema measures 2.5 cm. Multiple additional smaller areas of hemorrhage are present in the frontal and parietal lobes bilaterally and in the right occipital lobe. Ventricle size normal. Mild midline shift to the right of approximately 3 mm. Vascular: Negative for hyperdense vessel Skull: No skeletal lesion identified in  the calvarium Sinuses/Orbits: Paranasal sinuses clear.  Negative orbit. Other: None IMPRESSION: 1. 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 Mild midline shift to the right. 2. These results were called by telephone at the time of interpretation on 09/14/2020 at 9:44 pm to provider Rory Percy , who verbally acknowledged these results. Electronically Signed   By: Franchot Gallo M.D.   On: 09/14/2020 21:45    EKG: Independently reviewed. Sinus bradycardia with sinus arrhythmia, rate 55.   Assessment/Plan   1. Metastatic disease  - Presents with left-sided weakness, diplopia, malaise, N/V, and a transient LOC with urinary incontinence and is found to have multiple hemorrhagic brain metastases, RUL nodule that could reflect primary or met, liver lesions suspicious for metastases, and indeterminate right kidney lesion on CT  - Appreciate neurology recommendations  - Continue Decadron, seizure precautions, and Keppra, check MRI abd, will need oncology consultation   2. Suspected seizure - Patient had a transient LOC with urinary incontinence pta   - He was loaded with Keppra in ED, will be continued on Keppra 500 mg BID, continue seizure precautions, check EEG   3. Elevated BP  - No hx of HTN  - Treat as needed to keep SBP <160    4. Hyponatremia; hypokalemia  - Serum sodium is 131 and potassium 3.4 on admission in setting of N/V and hypovolemia  - Hydrate with NS + KCl, repeat chem panel in am   5. Leukocytosis  - WBC is 18.6k without fever or apparent infection  - Likely secondary to metastatic disease, will culture if febrile   6. Elevated transaminases  - AST 64 and ALT 102 on admission with normal alk phos and t bili 1.4  - Likely secondary to metastatic disease    7. Alcohol dependence  - Patient reports drinking ~5 "generous" glasses of wine every day, has hx of mild withdrawal symptoms  - Monitor with CIWA, use Ativan as needed    DVT prophylaxis:  SCDs Code Status: Full  Family Communication: Wife updated at bedside   Disposition Plan:  Patient is from: Home  Anticipated d/c is to: TBD Anticipated d/c date is: 1/1 or 09/17/20 Patient currently: Pending EEG, MRI abd   Consults called: Neurology  Admission status: Observation     Vianne Bulls, MD [facility]  09/15/2020, 3:45 AM

## 2020-09-15 NOTE — Plan of Care (Signed)
  Problem: Education: Goal: Knowledge of General Education information will improve Description: Including pain rating scale, medication(s)/side effects and non-pharmacologic comfort measures Reactivated   

## 2020-09-15 NOTE — ED Notes (Signed)
Pt noted to be drinking a Art therapist in room.

## 2020-09-15 NOTE — ED Notes (Signed)
Pt is still in MRI, unable to obtain updated V/S.

## 2020-09-15 NOTE — ED Notes (Signed)
Provider at bedside this AM give this RN permission to put in Regular diet for pt if IR was never able to get his biopsy done this shift.

## 2020-09-15 NOTE — ED Notes (Signed)
Dinner Tray Ordered @ 1704. 

## 2020-09-15 NOTE — Consult Note (Signed)
Chief Complaint: Patient was seen in consultation today for liver lesions/biopsy.  Referring Physician(s): Rai, Ripudeep [facility])  Supervising Physician: Jacqulynn Cadet  Patient Status: [facility] - ED  History of Present Illness: Sean [name] is a 54 y.o. male with a past medical history of hyperlipidemia. He presented to [facility] ED via EMS 09/14/2020 secondary to syncope and collapse. Per notes, yesterday AM patient walked to refrigerator at home, opened the door and collapsed into the refrigerator- he did lose consciousness, had urinary incontinence, and following had left-sided weakness along with diplopia and N/V. A Code Stroke was initiated in ED- CT head concerning for multiple hemorrhagic metastases. Further imaging scans concerning for liver lesions and lung nodules. Further questioning revealed patient has been experiencing 1-2 weeks of general malaise- he took two COVID-19 tests which were negative. He was admitted for further management of possible metastatic disease, along with suspected seizure (given LOC and urinary incontinence).  CT head 09/14/2020: 1. 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 Mild midline shift to the right.  CTA head/neck 09/14/2020: 1. No large vessel occlusion, hemodynamically significant stenosis, or evidence of dissection. 2. Multiple hemorrhagic lesions are seen in the bilateral cerebral hemispheres as described above.  CT chest/abdomen/pelvis 09/14/2020: 1. A 1.4 x 1.0 cm right upper lobe subpleural nodule, which could reflect primary or metastatic disease. PET-CT may be useful for follow-up. The 0.5 cm right lower lobe nodule be too small to characterize by PET scan, and will require serial follow-up. 2. Multiple small hypodensities within the liver parenchyma, suspicious for liver metastases. 3. A 1.6 cm hypodensity upper pole right kidney, indeterminate. This could be further evaluated with MRI without and with  contrast.  MR abdomen today: 1. Multifocal metastatic disease in the liver with intrinsic T1 hyperintensity, raising the question of metastatic melanoma based on signal characteristics, small size and multiplicity. Other differential considerations would include carcinoid tumor given the appearance on diffusion-weighted imaging. 2. Area of concern in the RIGHT kidney is not visualized on the current exam. Suggest close attention on follow-up to this location. Perhaps this represented a small area of focal nephritis. Could be that this is isointense to parenchyma on baseline images to slightly hyperintense and does not separated cell from the surrounding cortex well on post-contrast images though it cannot be seen on other sequences either on today's study. 3. Hepatic steatosis.  IR consulted by Dr. Tana Coast for possible image-guided liver lesion biopsy for tissue diagnosis. Patient awake and alert laying in bed. Wife at bedside. Complains of headache, stable since admission. Denies fever, chills, chest pain, dyspnea, or abdominal pain.   Past Medical History:  Diagnosis Date  . Hyperlipidemia     Past Surgical History:  Procedure Laterality Date  . HERNIA REPAIR      Allergies: Patient has no known allergies.  Medications: Prior to Admission medications   Medication Sig Start Date End Date Taking? Authorizing Provider  acetaminophen (TYLENOL) 500 MG tablet Take 500 mg by mouth every 6 (six) hours as needed for mild pain.   Yes [provider]     Family History  Problem Relation Age of Onset  . Osteoarthritis Mother   . Hypertension Father   . Colon polyps Father   . Colon polyps Brother   . Colon polyps Brother   . Colon cancer Neg Hx   . Stomach cancer Neg Hx   . Rectal cancer Neg Hx   . Esophageal cancer Neg Hx   .  Liver cancer Neg Hx     Social History   Socioeconomic History  . Marital status: Married    Spouse name: Not on file  . Number of children: 0   . Years of education: Not on file  . Highest education level: Not on file  Occupational History  . Occupation: Dentistinspector    Employer: [employer]  Tobacco Use  . Smoking status: Former Games developermoker  . Smokeless tobacco: Never Used  Substance and Sexual Activity  . Alcohol use: Yes    Comment: 2-3 glasses of wine a day  . Drug use: No  . Sexual activity: Not on file  Other Topics Concern  . Not on file  Social History Narrative  . Not on file   Social Determinants of Health   Financial Resource Strain: Not on file  Food Insecurity: Not on file  Transportation Needs: Not on file  Physical Activity: Not on file  Stress: Not on file  Social Connections: Not on file     Review of Systems: A 12 point ROS discussed and pertinent positives are indicated in the HPI above.  All other systems are negative.  Review of Systems  Constitutional: Negative for chills and fever.  Respiratory: Negative for shortness of breath and wheezing.   Cardiovascular: Negative for chest pain and palpitations.  Gastrointestinal: Negative for abdominal pain.  Neurological: Positive for headaches.  Psychiatric/Behavioral: Negative for behavioral problems and confusion.    Vital Signs: BP 108/71   Pulse 62   Temp 97.8 F (36.6 C) (Oral)   Resp 18   Ht 5' 11.5" (1.816 m)   Wt 218 lb (98.9 kg)   SpO2 97%   BMI 29.98 kg/m   Physical Exam Vitals and nursing note reviewed.  Constitutional:      General: He is not in acute distress.    Appearance: Normal appearance.  Cardiovascular:     Rate and Rhythm: Normal rate and regular rhythm.     Heart sounds: Normal heart sounds. No murmur heard.   Pulmonary:     Effort: Pulmonary effort is normal. No respiratory distress.     Breath sounds: Normal breath sounds. No wheezing.  Abdominal:     Palpations: Abdomen is soft.     Tenderness: There is no abdominal tenderness.  Skin:    General: Skin is warm and dry.  Neurological:     Mental  Status: He is alert and oriented to person, place, and time.      MD Evaluation Airway: WNL Heart: WNL Abdomen: WNL Chest/ Lungs: WNL ASA  Classification: 3 Mallampati/Airway Score: Two   Imaging: CT ANGIO HEAD W OR WO CONTRAST  Result Date: 09/14/2020 CLINICAL DATA:  Neuro deficit, acute, stroke suspected. EXAM: CT ANGIOGRAPHY HEAD AND NECK TECHNIQUE: Multidetector CT imaging of the head and neck was performed using the standard protocol during bolus administration of intravenous contrast. Multiplanar CT image reconstructions and MIPs were obtained to evaluate the vascular anatomy. Carotid stenosis measurements (when applicable) are obtained utilizing NASCET criteria, using the distal internal carotid diameter as the denominator. CONTRAST:  100mL OMNIPAQUE IOHEXOL 300 MG/ML  SOLN COMPARISON:  Head CT September 14, 2020. FINDINGS: CT HEAD FINDINGS Brain: Postcontrast images showed multiple hemorrhagic lesions (series 12), as seen on prior CT: - measuring 17 mm in the right frontal lobe with mild surrounding edema, image 28; - 6 mm in the left frontal lobe, minimal surrounding edema, image 25; - 5 mm in the right parietal lobe, no significant  edema, image 23; - 15 mm the left parietal lobe, mild surrounding vasogenic edema, image 23; - 31 mm in the left frontal lobe with surrounding vasogenic edema and effacement of the adjacent cerebral sulci, image 20; - 27 mm in the left occipital lobe, decompressing into the occipital horn of the left lateral ventricle with surrounding vasogenic edema effacing the adjacent cerebral sulci, image 17. In addition to the previously seen hemorrhagic lesions, enhancing 2 small enhancing lesions are seen in the left frontal lobe measuring 2-3 mm each (image 26) and a 6 mm enhancing lesion is seen in the right occipital lobe (image 13). No hydrocephalus. Evaluation for subarachnoid hemorrhage limited due to intravenous contrast. Vascular: See below. Skull: Normal.  Negative for fracture or focal lesion. Sinuses: Imaged portions are clear. Orbits: No acute finding. Review of the MIP images confirms the above findings CTA NECK FINDINGS Aortic arch: Common origin of the innominate and left common carotid arteries from the aortic arch. The left vertebral artery origin is directly from the aortic arch. Imaged portion shows no evidence of aneurysm or dissection. No significant stenosis of the major arch vessel origins. Right carotid system: No evidence of dissection, stenosis (50% or greater) or occlusion. Left carotid system: No evidence of dissection, stenosis (50% or greater) or occlusion. Vertebral arteries: The left vertebral artery origin is directly from the aortic arch and is dominant. No evidence of dissection, stenosis (50% or greater) or occlusion. Skeleton: No aggressive lesion identified. Other neck: Negative. Upper chest: Negative. Review of the MIP images confirms the above findings CTA HEAD FINDINGS Anterior circulation: No significant stenosis, proximal occlusion, aneurysm, or vascular malformation. Posterior circulation: No significant stenosis, proximal occlusion, aneurysm, or vascular malformation. Venous sinuses: As permitted by contrast timing, patent. Review of the MIP images confirms the above findings IMPRESSION: 1. No large vessel occlusion, hemodynamically significant stenosis, or evidence of dissection. 2. Multiple hemorrhagic lesions are seen in the bilateral cerebral hemispheres as described above. Electronically Signed   By: Pedro Earls M.D.   On: 09/14/2020 22:24   CT ANGIO NECK W OR WO CONTRAST  Result Date: 09/14/2020 CLINICAL DATA:  Neuro deficit, acute, stroke suspected. EXAM: CT ANGIOGRAPHY HEAD AND NECK TECHNIQUE: Multidetector CT imaging of the head and neck was performed using the standard protocol during bolus administration of intravenous contrast. Multiplanar CT image reconstructions and MIPs were obtained to evaluate  the vascular anatomy. Carotid stenosis measurements (when applicable) are obtained utilizing NASCET criteria, using the distal internal carotid diameter as the denominator. CONTRAST:  165mL OMNIPAQUE IOHEXOL 300 MG/ML  SOLN COMPARISON:  Head CT September 14, 2020. FINDINGS: CT HEAD FINDINGS Brain: Postcontrast images showed multiple hemorrhagic lesions (series 12), as seen on prior CT: - measuring 17 mm in the right frontal lobe with mild surrounding edema, image 28; - 6 mm in the left frontal lobe, minimal surrounding edema, image 25; - 5 mm in the right parietal lobe, no significant edema, image 23; - 15 mm the left parietal lobe, mild surrounding vasogenic edema, image 23; - 31 mm in the left frontal lobe with surrounding vasogenic edema and effacement of the adjacent cerebral sulci, image 20; - 27 mm in the left occipital lobe, decompressing into the occipital horn of the left lateral ventricle with surrounding vasogenic edema effacing the adjacent cerebral sulci, image 17. In addition to the previously seen hemorrhagic lesions, enhancing 2 small enhancing lesions are seen in the left frontal lobe measuring 2-3 mm each (image 26) and a  6 mm enhancing lesion is seen in the right occipital lobe (image 13). No hydrocephalus. Evaluation for subarachnoid hemorrhage limited due to intravenous contrast. Vascular: See below. Skull: Normal. Negative for fracture or focal lesion. Sinuses: Imaged portions are clear. Orbits: No acute finding. Review of the MIP images confirms the above findings CTA NECK FINDINGS Aortic arch: Common origin of the innominate and left common carotid arteries from the aortic arch. The left vertebral artery origin is directly from the aortic arch. Imaged portion shows no evidence of aneurysm or dissection. No significant stenosis of the major arch vessel origins. Right carotid system: No evidence of dissection, stenosis (50% or greater) or occlusion. Left carotid system: No evidence of  dissection, stenosis (50% or greater) or occlusion. Vertebral arteries: The left vertebral artery origin is directly from the aortic arch and is dominant. No evidence of dissection, stenosis (50% or greater) or occlusion. Skeleton: No aggressive lesion identified. Other neck: Negative. Upper chest: Negative. Review of the MIP images confirms the above findings CTA HEAD FINDINGS Anterior circulation: No significant stenosis, proximal occlusion, aneurysm, or vascular malformation. Posterior circulation: No significant stenosis, proximal occlusion, aneurysm, or vascular malformation. Venous sinuses: As permitted by contrast timing, patent. Review of the MIP images confirms the above findings IMPRESSION: 1. No large vessel occlusion, hemodynamically significant stenosis, or evidence of dissection. 2. Multiple hemorrhagic lesions are seen in the bilateral cerebral hemispheres as described above. Electronically Signed   By: Pedro Earls M.D.   On: 09/14/2020 22:24   MR ABDOMEN W WO CONTRAST  Result Date: 09/15/2020 CLINICAL DATA:  Metastatic disease, indeterminate RIGHT renal lesion in this 54 year old male found to have intracranial abnormalities and abnormalities on recent CT of the chest, abdomen and pelvis in the abdomen EXAM: MRI ABDOMEN WITHOUT AND WITH CONTRAST TECHNIQUE: Multiplanar multisequence MR imaging of the abdomen was performed both before and after the administration of intravenous contrast. CONTRAST:  76mL GADAVIST GADOBUTROL 1 MMOL/ML IV SOLN COMPARISON:  CT chest, abdomen and pelvis of September 14, 2020 FINDINGS: Lower chest: Incidental imaging of the lung bases on MRI with limited assessment is unremarkable. No effusion. No consolidative changes. Hepatobiliary: Numerous areas of diffusion related abnormalities in the hepatic parenchyma, for instance on image 97 of series 6 there are 4 distinct lesions 1 in the posterior RIGHT hepatic lobe likely corresponding to 1 of the areas  seen on CT and 2 in the medial RIGHT hepatic lobe near the IVC confluence. Assessment of diffusion weighted images shows greater than 20 scattered foci of subtle restricted diffusion throughout the liver, subtle based on size There is a tiny focus of restricted diffusion along the margin of the posterior RIGHT hemi liver which may be extrahepatic (image 111, series 8) this measures approximately 5 mm. For reference the largest area of signal abnormality in the liver measures 11 mm on image 96 of series 8. The rest of these abnormalities are less than a cm and most within the 3-4 mm size range. The largest area of diffusion related signal abnormality shows peripheral enhancement and signs of washout measuring 16 mm (image 36, series 18) greatest axial dimension on the contrasted images. Other tiny foci are scattered throughout the liver showing enhancement though not as well seen is on diffusion related images. Scattered tiny foci show intrinsic T1 hyperintensity. No signs of biliary duct dilation or pericholecystic stranding. Pancreas: Normal intrinsic T1 signal. No ductal dilation or sign of inflammation. Spleen:  Normal size spleen without focal lesion. Adrenals/Urinary Tract:  Adrenal glands are normal. Symmetric renal enhancement. No hydronephrosis. No suspicious renal lesion. The area of concern in the RIGHT upper pole discussed on the previous CT evaluation is not visible on MRI showing subtle wedge-shaped characteristics but slightly more rounded than expected for infarct or pyelonephritis though potentially related focal pyelonephritis given that it is not visible on the current exam. Subtle stranding is noted in this location adjacent to the kidney. Stomach/Bowel: No acute gastrointestinal process to the extent evaluated. Note that there is limited assessment of the gastrointestinal tract on MRI not performed for bowel evaluation. Vascular/Lymphatic: Patent abdominal vasculature. There is no gastrohepatic  or hepatoduodenal ligament lymphadenopathy. No retroperitoneal or mesenteric lymphadenopathy. Other: Trace subtle indistinct appearance of the RIGHT posterior hepatic lobe. Area of restricted diffusion along the inferior RIGHT hepatic margin shows intrinsic T1 hyperintensity. Musculoskeletal: No suspicious bone lesions identified. IMPRESSION: Multifocal metastatic disease in the liver with intrinsic T1 hyperintensity, raising the question of metastatic melanoma based on signal characteristics, small size and multiplicity. Other differential considerations would include carcinoid tumor given the appearance on diffusion-weighted imaging. Area of concern in the RIGHT kidney is not visualized on the current exam. Suggest close attention on follow-up to this location. Perhaps this represented a small area of focal nephritis. Could be that this is isointense to parenchyma on baseline images to slightly hyperintense and does not separated cell from the surrounding cortex well on post-contrast images though it cannot be seen on other sequences either on today's study. Hepatic steatosis. Electronically Signed   By: Zetta Bills M.D.   On: 09/15/2020 10:01   CT CHEST ABDOMEN PELVIS W CONTRAST  Result Date: 09/14/2020 CLINICAL DATA:  Intracranial metastases, no known primary malignancy EXAM: CT CHEST, ABDOMEN, AND PELVIS WITH CONTRAST TECHNIQUE: Multidetector CT imaging of the chest, abdomen and pelvis was performed following the standard protocol during bolus administration of intravenous contrast. CONTRAST:  158mL OMNIPAQUE IOHEXOL 300 MG/ML  SOLN COMPARISON:  03/13/2015 FINDINGS: CT CHEST FINDINGS Cardiovascular: The heart and great vessels are unremarkable without pericardial effusion. No evidence of aortic aneurysm or dissection. Mediastinum/Nodes: No enlarged mediastinal, hilar, or axillary lymph nodes. Thyroid gland, trachea, and esophagus demonstrate no significant findings. Lungs/Pleura: Pulmonary nodules are  as follows: 1.4 x 1.0 cm right upper lobe subpleural nodule image 51/6. 0.5 cm right lower lobe pulmonary nodule image 82/6. No airspace disease, effusion, or pneumothorax. The central airways are patent. Musculoskeletal: No acute or destructive bony lesions. Reconstructed images demonstrate no additional findings. CT ABDOMEN PELVIS FINDINGS Hepatobiliary: Multiple small hypodensities are seen within the liver parenchyma, too small to characterize but suspicious for metastatic disease given the intracranial findings. Hypodensities are seen within the right lobe liver image 46 and 49, measuring up to 11 mm in size. Hypodensity within the inferior margin of the right lobe liver image 76 measures 13 mm. No intrahepatic duct dilation. The gallbladder is unremarkable. Pancreas: Unremarkable. No pancreatic ductal dilatation or surrounding inflammatory changes. Spleen: Normal in size without focal abnormality. Adrenals/Urinary Tract: 1.6 cm hypodensity upper pole right kidney measures 23 Hounsfield units image 66/5, indeterminate. This is new since prior CT. The remainder of the kidneys enhance normally. No urinary tract calculi or obstruction. The adrenals and bladder are unremarkable. Stomach/Bowel: No bowel obstruction or ileus. Normal appendix right lower quadrant. No bowel wall thickening or inflammatory change. Vascular/Lymphatic: No significant vascular findings are present. No enlarged abdominal or pelvic lymph nodes. Reproductive: Prostate is unremarkable. Other: No free fluid or free gas. No abdominal wall hernia.  Musculoskeletal: No acute or destructive bony lesions. Reconstructed images demonstrate no additional findings. IMPRESSION: 1. A 1.4 x 1.0 cm right upper lobe subpleural nodule, which could reflect primary or metastatic disease. PET-CT may be useful for follow-up. The 0.5 cm right lower lobe nodule be too small to characterize by PET scan, and will require serial follow-up. 2. Multiple small  hypodensities within the liver parenchyma, suspicious for liver metastases. 3. A 1.6 cm hypodensity upper pole right kidney, indeterminate. This could be further evaluated with MRI without and with contrast. Electronically Signed   By: Sharlet Salina M.D.   On: 09/14/2020 22:15   CT HEAD CODE STROKE WO CONTRAST  Result Date: 09/14/2020 CLINICAL DATA:  Code stroke.  Acute neuro deficit.  Vomiting. EXAM: CT HEAD WITHOUT CONTRAST TECHNIQUE: Contiguous axial images were obtained from the base of the skull through the vertex without intravenous contrast. COMPARISON:  None. FINDINGS: Brain: Multiple areas of hemorrhage in the brain. Many these are associated with mass lesions and edema. Appearance is most consistent with hemorrhagic metastasis. Largest lesion in the left frontal lobe measures 2.8 cm in diameter with mild hemorrhage and mild to moderate edema. Left occipital hemorrhage with surrounding edema measures 2.5 cm. Multiple additional smaller areas of hemorrhage are present in the frontal and parietal lobes bilaterally and in the right occipital lobe. Ventricle size normal. Mild midline shift to the right of approximately 3 mm. Vascular: Negative for hyperdense vessel Skull: No skeletal lesion identified in the calvarium Sinuses/Orbits: Paranasal sinuses clear.  Negative orbit. Other: None IMPRESSION: 1. 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 Mild midline shift to the right. 2. These results were called by telephone at the time of interpretation on 09/14/2020 at 9:44 pm to provider Wilford Corner , who verbally acknowledged these results. Electronically Signed   By: Marlan Palau M.D.   On: 09/14/2020 21:45    Labs:  CBC: Recent Labs    09/14/20 1042 09/14/20 2144 09/14/20 2148 09/15/20 0326  WBC 15.1* 18.6*  --  16.5*  HGB 16.6 16.8 16.7 17.2*  HCT 49.2 46.1 49.0 49.7  PLT 304 325  --  284    COAGS: Recent Labs    09/14/20 2144  INR 1.1  APTT 30     BMP: Recent Labs    09/14/20 1042 09/14/20 2144 09/14/20 2148 09/15/20 0430  NA 134* 131* 134* 138  K 4.3 3.4* 3.5 3.7  CL 100 97* 97* 101  CO2 20* 20*  --  23  GLUCOSE 136* 140* 139* 135*  BUN 19 16 17 13   CALCIUM 9.2 9.1  --  9.4  CREATININE 0.85 0.83 0.60* 0.91  GFRNONAA >60 >60  --  >60    LIVER FUNCTION TESTS: Recent Labs    09/14/20 2144 09/15/20 0430  BILITOT 1.4* 1.6*  AST 64* 82*  ALT 102* 141*  ALKPHOS 108 113  PROT 7.5 7.5  ALBUMIN 4.1 4.0     Assessment and Plan:  Brain, lung, and liver lesions concerning for metastatic disease without known primary. Plan for image-guided liver lesion biopsy in IR tentatively for Monday 09/18/2020 pending IR scheduling. Patient will be NPO at midnight prior to procedure. Afebrile. He does not take blood thinners. INR 1.1 09/14/2020.  Risks and benefits discussed with the patient including, but not limited to bleeding, infection, damage to adjacent structures or low yield requiring additional tests. All of the patient's questions were answered, patient is agreeable to proceed. Consent signed and in  IR control room.   Thank you for this interesting consult.  I greatly enjoyed meeting Sean [name] and look forward to participating in their care.  A copy of this report was sent to the requesting provider on this date.  Electronically Signed: Earley Abide, PA-C 09/15/2020, 12:19 PM   I spent a total of 40 Minutes in face to face in clinical consultation, greater than 50% of which was counseling/coordinating care for liver lesions/biopsy.

## 2020-09-16 DIAGNOSIS — R030 Elevated blood-pressure reading, without diagnosis of hypertension: Secondary | ICD-10-CM | POA: Diagnosis not present

## 2020-09-16 DIAGNOSIS — C7931 Secondary malignant neoplasm of brain: Principal | ICD-10-CM

## 2020-09-16 LAB — CANCER ANTIGEN 19-9: CA 19-9: 5 U/mL (ref 0–35)

## 2020-09-16 LAB — COMPREHENSIVE METABOLIC PANEL
ALT: 139 U/L — ABNORMAL HIGH (ref 0–44)
AST: 52 U/L — ABNORMAL HIGH (ref 15–41)
Albumin: 3.7 g/dL (ref 3.5–5.0)
Alkaline Phosphatase: 101 U/L (ref 38–126)
Anion gap: 11 (ref 5–15)
BUN: 17 mg/dL (ref 6–20)
CO2: 25 mmol/L (ref 22–32)
Calcium: 8.9 mg/dL (ref 8.9–10.3)
Chloride: 100 mmol/L (ref 98–111)
Creatinine, Ser: 0.88 mg/dL (ref 0.61–1.24)
GFR, Estimated: >60 mL/min (ref >60)
Glucose, Bld: 128 mg/dL — ABNORMAL HIGH (ref 70–99)
Potassium: 3.7 mmol/L (ref 3.5–5.1)
Sodium: 136 mmol/L (ref 135–145)
Total Bilirubin: 1.1 mg/dL (ref 0.3–1.2)
Total Protein: 6.8 g/dL (ref 6.5–8.1)

## 2020-09-16 LAB — CBC
HCT: 46.4 % (ref 39.0–52.0)
Hemoglobin: 16 g/dL (ref 13.0–17.0)
MCH: 31.1 pg (ref 26.0–34.0)
MCHC: 34.5 g/dL (ref 30.0–36.0)
MCV: 90.1 fL (ref 80.0–100.0)
Platelets: 296 10*3/uL (ref 150–400)
RBC: 5.15 MIL/uL (ref 4.22–5.81)
RDW: 12 % (ref 11.5–15.5)
WBC: 14.3 10*3/uL — ABNORMAL HIGH (ref 4.0–10.5)
nRBC: 0 % (ref 0.0–0.2)

## 2020-09-16 LAB — AFP TUMOR MARKER: AFP, Serum, Tumor Marker: 6.1 ng/mL (ref 0.0–8.3)

## 2020-09-16 LAB — MAGNESIUM: Magnesium: 2.2 mg/dL (ref 1.7–2.4)

## 2020-09-16 LAB — CEA: CEA: 0.6 ng/mL (ref 0.0–4.7)

## 2020-09-16 LAB — PHOSPHORUS: Phosphorus: 3.5 mg/dL (ref 2.5–4.6)

## 2020-09-16 MED ORDER — POLYETHYLENE GLYCOL 3350 17 G PO PACK
17.0000 g | PACK | Freq: Every day | ORAL | Status: DC | PRN
  Administered 2020-09-17: 17 g via ORAL
  Filled 2020-09-16: qty 1

## 2020-09-16 MED ORDER — ENOXAPARIN SODIUM 40 MG/0.4ML ~~LOC~~ SOLN
40.0000 mg | SUBCUTANEOUS | Status: DC
  Administered 2020-09-16: 40 mg via SUBCUTANEOUS
  Filled 2020-09-16: qty 0.4

## 2020-09-16 MED ORDER — DOCUSATE SODIUM 100 MG PO CAPS
100.0000 mg | ORAL_CAPSULE | Freq: Every day | ORAL | Status: DC
  Administered 2020-09-16 – 2020-09-18 (×3): 100 mg via ORAL
  Filled 2020-09-16 (×3): qty 1

## 2020-09-16 MED ORDER — MORPHINE SULFATE (PF) 2 MG/ML IV SOLN: 2.0000 mg | Freq: Once | INTRAVENOUS | Status: DC

## 2020-09-16 MED ORDER — HYDROMORPHONE HCL 1 MG/ML IJ SOLN
1.0000 mg | INTRAMUSCULAR | Status: DC | PRN
  Administered 2020-09-16: 2 mg via INTRAVENOUS
  Administered 2020-09-17: 1 mg via INTRAVENOUS
  Administered 2020-09-17: 2 mg via INTRAVENOUS
  Filled 2020-09-16: qty 1
  Filled 2020-09-16: qty 2
  Filled 2020-09-16: qty 1
  Filled 2020-09-16: qty 2

## 2020-09-16 NOTE — Progress Notes (Addendum)
[facility]                                                                              Patient Demographics  Sean [name], is a 55 y.o. male, DOB - February 22, 1966, [id number]  Admit date - 09/14/2020   Admitting Physician Vianne Bulls, MD  Outpatient Primary MD for the patient is Deland Pretty, MD  Outpatient specialists:   LOS - 1  days   Medical records reviewed and are as summarized below:    Chief Complaint  Patient presents with  . Vomiting  . Loss of Consciousness       Brief summary   Patient is a 55 year old male with no significant past medical history, remote smoking history, not taking any medications regularly presented to ED with 1 to 2 weeks of malaise, more recent left-sided weakness, nausea, vomiting, and a brief loss of consciousness with urinary incontinence reported by EMS. He thought he had a viral illness and had 2 negative COVID tests prior to coming in. Reports smoking 1.5 ppd for 20 years before he quit. Drinks ~5 "generous" glasses of wine every day and has history of mild withdrawal symptoms. Does not know of any family history of cancer.  In ED, CT head showed multiple hemorrhagic metastasis with surrounding edema and mild midline shift to the right. CTA head and neck negative for large vessel occlusion. CT chest abdomen pelvis showed right upper lobe subpleural nodule which could reflect primary malignancy or metastasis, multiple liver lesion concerning for mass, indeterminate right renal hypodensity.  Patient was evaluated by neurology in ED and started on Decadron Keppra and Norco.   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  Found to have multiple brain metastasis with surrounding edema and mild midline shift to the  right -Appreciate neurology mentation, continue Decadron, Keppra, seizure precautions -Tumor markers including PSA, CEA, CA 19-9, AFP: unremarkable -MRI abdomen showed multifocal metastatic disease in the liver with intrinsic T1 hyperintensity raising the question of metastatic melanoma -Oncology consult  with Dr. [name], will await recommendations after biopsy -IR consult obtained, discussed with Dr. Laurence Ferrari, earliest biopsy can be done on Monday morning  Suspected seizure -Presented with LOC, urinary incontinence -Likely due to brain mets with surrounding edema and midline shift -Continue Keppra 500 mg twice daily, Decadron  Hyponatremia, hypokalemia -Resolved  Transaminitis -Possibly due to liver mets vs alcohol, follow closely  Alcohol dependence -Patient reported drinking 5 glasses of wine every day, has mild withdrawal symptoms -Continue CIWA with Ativan  Code Status: Full CODE STATUS DVT Prophylaxis: SCDs due to hemorrhagic mets to brain   Disposition Plan:     Status is: inpt  The patient will require care spanning > 2 midnights and should be moved to inpatient because: Inpatient level of care appropriate due to severity of illness  Dispo: The patient is from: Home              Anticipated d/c is to: Home  Anticipated d/c date is: 3 days              Patient currently is not medically stable to d/c.,  Pending biopsy, awaiting oncology recommendations     Time Spent in minutes   35 minutes  Procedures:  CTA, MRI abdomen  Consultants:   Oncology, Dr. [name] Interventional radiology   Medications  Scheduled Meds: . dexamethasone (DECADRON) injection  4 mg Intravenous [id number]  . folic acid  1 mg Oral Daily  . levETIRAcetam  500 mg Oral BID  . LORazepam  0-4 mg Intravenous Q6H   Followed by  . [START ON 09/17/2020] LORazepam  0-4 mg Intravenous Q12H  . multivitamin with minerals  1 tablet Oral Daily  . pantoprazole  40 mg Oral Q1200  .  thiamine  100 mg Oral Daily   Or  . thiamine  100 mg Intravenous Daily   Continuous Infusions:  PRN Meds:.acetaminophen, hydrALAZINE, HYDROcodone-acetaminophen, HYDROmorphone (DILAUDID) injection, LORazepam **OR** LORazepam, ondansetron (ZOFRAN) IV      Subjective:   No overnight events, no questions about plan for biopsy Monday  Objective:   Vitals:   09/15/20 2247 09/15/20 2316 09/16/20 0449 09/16/20 1105  BP:  (!) 135/93 126/81 131/83  Pulse:  68 63 69  Resp:  18 18 17   Temp: 98.1 F (36.7 C) 99.1 F (37.3 C) 98.3 F (36.8 C) 98.6 F (37 C)  TempSrc: Oral Oral Oral Oral  SpO2:  94% 94% 95%  Weight:      Height:        Intake/Output Summary (Last 24 hours) at 09/16/2020 1112 Last data filed at 09/16/2020 0520 Gross per 24 hour  Intake 240 ml  Output 900 ml  Net -660 ml     Wt Readings from Last 3 Encounters:  09/14/20 98.9 kg  11/28/16 101.6 kg  11/12/16 101.7 kg     Exam  General: Appearance:     Overweight male in no acute distress     Lungs:     Clear to auscultation bilaterally, respirations unlabored  Heart:    Normal heart rate. Normal rhythm. No murmurs, rubs, or gallops.   MS:   All extremities are intact.   Neurologic:   Awake, alert, oriented x 3. No apparent focal neurological           defect.    Data Reviewed:  I have personally reviewed following labs and imaging studies  Micro Results Recent Results (from the past 240 hour(s))  Resp Panel by RT-PCR (Flu A&B, Covid) Nasopharyngeal Swab     Status: None   Collection Time: 09/14/20  9:23 PM   Specimen: Nasopharyngeal Swab; Nasopharyngeal(NP) swabs in vial transport medium  Result Value Ref Range Status   SARS Coronavirus 2 by RT PCR NEGATIVE NEGATIVE Final    Comment: (NOTE) SARS-CoV-2 target nucleic acids are NOT DETECTED.  The SARS-CoV-2 RNA is generally detectable in upper respiratory specimens during the acute phase of infection. The lowest concentration of SARS-CoV-2 viral  copies this assay can detect is 138 copies/mL. A negative result does not preclude SARS-Cov-2 infection and should not be used as the sole basis for treatment or other patient management decisions. A negative result may occur with  improper specimen collection/handling, submission of specimen other than nasopharyngeal swab, presence of viral mutation(s) within the areas targeted by this assay, and inadequate number of viral copies(<138 copies/mL). A negative result must be combined with clinical observations, patient history, and epidemiological information. The expected result is  Negative.  Fact Sheet for Patients:  BloggerCourse.com  Fact Sheet for Healthcare Providers:  SeriousBroker.it  This test is no t yet approved or cleared by the [place] FDA and  has been authorized for detection and/or diagnosis of SARS-CoV-2 by FDA under an Emergency Use Authorization (EUA). This EUA will remain  in effect (meaning this test can be used) for the duration of the COVID-19 declaration under Section 564(b)(1) of the Act, 21 U.S.C.section 360bbb-3(b)(1), unless the authorization is terminated  or revoked sooner.       Influenza A by PCR NEGATIVE NEGATIVE Final   Influenza B by PCR NEGATIVE NEGATIVE Final    Comment: (NOTE) The Xpert Xpress SARS-CoV-2/FLU/RSV plus assay is intended as an aid in the diagnosis of influenza from Nasopharyngeal swab specimens and should not be used as a sole basis for treatment. Nasal washings and aspirates are unacceptable for Xpert Xpress SARS-CoV-2/FLU/RSV testing.  Fact Sheet for Patients: BloggerCourse.com  Fact Sheet for Healthcare Providers: SeriousBroker.it  This test is not yet approved or cleared by the [place] FDA and has been authorized for detection and/or diagnosis of SARS-CoV-2 by FDA under an Emergency Use Authorization (EUA). This  EUA will remain in effect (meaning this test can be used) for the duration of the COVID-19 declaration under Section 564(b)(1) of the Act, 21 U.S.C. section 360bbb-3(b)(1), unless the authorization is terminated or revoked.  Performed at [facility], [address], [place], [place] [address]     Radiology Reports CT ANGIO HEAD W OR WO CONTRAST  Result Date: 09/14/2020 CLINICAL DATA:  Neuro deficit, acute, stroke suspected. EXAM: CT ANGIOGRAPHY HEAD AND NECK TECHNIQUE: Multidetector CT imaging of the head and neck was performed using the standard protocol during bolus administration of intravenous contrast. Multiplanar CT image reconstructions and MIPs were obtained to evaluate the vascular anatomy. Carotid stenosis measurements (when applicable) are obtained utilizing NASCET criteria, using the distal internal carotid diameter as the denominator. CONTRAST:  OMNIPAQUE IOHEXOL 300 MG/ML  SOLN COMPARISON:  Head CT September 14, 2020. FINDINGS: CT HEAD FINDINGS Brain: Postcontrast images showed multiple hemorrhagic lesions (series 12), as seen on prior CT: - measuring 17 mm in the right frontal lobe with mild surrounding edema, image 28; - 6 mm in the left frontal lobe, minimal surrounding edema, image 25; - 5 mm in the right parietal lobe, no significant edema, image 23; - 15 mm the left parietal lobe, mild surrounding vasogenic edema, image 23; - 31 mm in the left frontal lobe with surrounding vasogenic edema and effacement of the adjacent cerebral sulci, image 20; - 27 mm in the left occipital lobe, decompressing into the occipital horn of the left lateral ventricle with surrounding vasogenic edema effacing the adjacent cerebral sulci, image 17. In addition to the previously seen hemorrhagic lesions, enhancing 2 small enhancing lesions are seen in the left frontal lobe measuring 2-3 mm each (image 26) and a 6 mm enhancing lesion is seen in the right occipital lobe (image 13). No  hydrocephalus. Evaluation for subarachnoid hemorrhage limited due to intravenous contrast. Vascular: See below. Skull: Normal. Negative for fracture or focal lesion. Sinuses: Imaged portions are clear. Orbits: No acute finding. Review of the MIP images confirms the above findings CTA NECK FINDINGS Aortic arch: Common origin of the innominate and left common carotid arteries from the aortic arch. The left vertebral artery origin is directly from the aortic arch. Imaged portion shows no evidence of aneurysm or dissection. No significant stenosis of  the major arch vessel origins. Right carotid system: No evidence of dissection, stenosis (50% or greater) or occlusion. Left carotid system: No evidence of dissection, stenosis (50% or greater) or occlusion. Vertebral arteries: The left vertebral artery origin is directly from the aortic arch and is dominant. No evidence of dissection, stenosis (50% or greater) or occlusion. Skeleton: No aggressive lesion identified. Other neck: Negative. Upper chest: Negative. Review of the MIP images confirms the above findings CTA HEAD FINDINGS Anterior circulation: No significant stenosis, proximal occlusion, aneurysm, or vascular malformation. Posterior circulation: No significant stenosis, proximal occlusion, aneurysm, or vascular malformation. Venous sinuses: As permitted by contrast timing, patent. Review of the MIP images confirms the above findings IMPRESSION: 1. No large vessel occlusion, hemodynamically significant stenosis, or evidence of dissection. 2. Multiple hemorrhagic lesions are seen in the bilateral cerebral hemispheres as described above. Electronically Signed   By: Pedro Earls M.D.   On: 09/14/2020 22:24   CT ANGIO NECK W OR WO CONTRAST  Result Date: 09/14/2020 CLINICAL DATA:  Neuro deficit, acute, stroke suspected. EXAM: CT ANGIOGRAPHY HEAD AND NECK TECHNIQUE: Multidetector CT imaging of the head and neck was performed using the standard  protocol during bolus administration of intravenous contrast. Multiplanar CT image reconstructions and MIPs were obtained to evaluate the vascular anatomy. Carotid stenosis measurements (when applicable) are obtained utilizing NASCET criteria, using the distal internal carotid diameter as the denominator. CONTRAST:  131mL OMNIPAQUE IOHEXOL 300 MG/ML  SOLN COMPARISON:  Head CT September 14, 2020. FINDINGS: CT HEAD FINDINGS Brain: Postcontrast images showed multiple hemorrhagic lesions (series 12), as seen on prior CT: - measuring 17 mm in the right frontal lobe with mild surrounding edema, image 28; - 6 mm in the left frontal lobe, minimal surrounding edema, image 25; - 5 mm in the right parietal lobe, no significant edema, image 23; - 15 mm the left parietal lobe, mild surrounding vasogenic edema, image 23; - 31 mm in the left frontal lobe with surrounding vasogenic edema and effacement of the adjacent cerebral sulci, image 20; - 27 mm in the left occipital lobe, decompressing into the occipital horn of the left lateral ventricle with surrounding vasogenic edema effacing the adjacent cerebral sulci, image 17. In addition to the previously seen hemorrhagic lesions, enhancing 2 small enhancing lesions are seen in the left frontal lobe measuring 2-3 mm each (image 26) and a 6 mm enhancing lesion is seen in the right occipital lobe (image 13). No hydrocephalus. Evaluation for subarachnoid hemorrhage limited due to intravenous contrast. Vascular: See below. Skull: Normal. Negative for fracture or focal lesion. Sinuses: Imaged portions are clear. Orbits: No acute finding. Review of the MIP images confirms the above findings CTA NECK FINDINGS Aortic arch: Common origin of the innominate and left common carotid arteries from the aortic arch. The left vertebral artery origin is directly from the aortic arch. Imaged portion shows no evidence of aneurysm or dissection. No significant stenosis of the major arch vessel origins.  Right carotid system: No evidence of dissection, stenosis (50% or greater) or occlusion. Left carotid system: No evidence of dissection, stenosis (50% or greater) or occlusion. Vertebral arteries: The left vertebral artery origin is directly from the aortic arch and is dominant. No evidence of dissection, stenosis (50% or greater) or occlusion. Skeleton: No aggressive lesion identified. Other neck: Negative. Upper chest: Negative. Review of the MIP images confirms the above findings CTA HEAD FINDINGS Anterior circulation: No significant stenosis, proximal occlusion, aneurysm, or vascular malformation. Posterior circulation: No  significant stenosis, proximal occlusion, aneurysm, or vascular malformation. Venous sinuses: As permitted by contrast timing, patent. Review of the MIP images confirms the above findings IMPRESSION: 1. No large vessel occlusion, hemodynamically significant stenosis, or evidence of dissection. 2. Multiple hemorrhagic lesions are seen in the bilateral cerebral hemispheres as described above. Electronically Signed   By: Pedro Earls M.D.   On: 09/14/2020 22:24   MR ABDOMEN W WO CONTRAST  Result Date: 09/15/2020 CLINICAL DATA:  Metastatic disease, indeterminate RIGHT renal lesion in this 55 year old male found to have intracranial abnormalities and abnormalities on recent CT of the chest, abdomen and pelvis in the abdomen EXAM: MRI ABDOMEN WITHOUT AND WITH CONTRAST TECHNIQUE: Multiplanar multisequence MR imaging of the abdomen was performed both before and after the administration of intravenous contrast. CONTRAST:  71mL GADAVIST GADOBUTROL 1 MMOL/ML IV SOLN COMPARISON:  CT chest, abdomen and pelvis of September 14, 2020 FINDINGS: Lower chest: Incidental imaging of the lung bases on MRI with limited assessment is unremarkable. No effusion. No consolidative changes. Hepatobiliary: Numerous areas of diffusion related abnormalities in the hepatic parenchyma, for instance on  image 97 of series 6 there are 4 distinct lesions 1 in the posterior RIGHT hepatic lobe likely corresponding to 1 of the areas seen on CT and 2 in the medial RIGHT hepatic lobe near the IVC confluence. Assessment of diffusion weighted images shows greater than 20 scattered foci of subtle restricted diffusion throughout the liver, subtle based on size There is a tiny focus of restricted diffusion along the margin of the posterior RIGHT hemi liver which may be extrahepatic (image 111, series 8) this measures approximately 5 mm. For reference the largest area of signal abnormality in the liver measures 11 mm on image 96 of series 8. The rest of these abnormalities are less than a cm and most within the 3-4 mm size range. The largest area of diffusion related signal abnormality shows peripheral enhancement and signs of washout measuring 16 mm (image 36, series 18) greatest axial dimension on the contrasted images. Other tiny foci are scattered throughout the liver showing enhancement though not as well seen is on diffusion related images. Scattered tiny foci show intrinsic T1 hyperintensity. No signs of biliary duct dilation or pericholecystic stranding. Pancreas: Normal intrinsic T1 signal. No ductal dilation or sign of inflammation. Spleen:  Normal size spleen without focal lesion. Adrenals/Urinary Tract:  Adrenal glands are normal. Symmetric renal enhancement. No hydronephrosis. No suspicious renal lesion. The area of concern in the RIGHT upper pole discussed on the previous CT evaluation is not visible on MRI showing subtle wedge-shaped characteristics but slightly more rounded than expected for infarct or pyelonephritis though potentially related focal pyelonephritis given that it is not visible on the current exam. Subtle stranding is noted in this location adjacent to the kidney. Stomach/Bowel: No acute gastrointestinal process to the extent evaluated. Note that there is limited assessment of the gastrointestinal  tract on MRI not performed for bowel evaluation. Vascular/Lymphatic: Patent abdominal vasculature. There is no gastrohepatic or hepatoduodenal ligament lymphadenopathy. No retroperitoneal or mesenteric lymphadenopathy. Other: Trace subtle indistinct appearance of the RIGHT posterior hepatic lobe. Area of restricted diffusion along the inferior RIGHT hepatic margin shows intrinsic T1 hyperintensity. Musculoskeletal: No suspicious bone lesions identified. IMPRESSION: Multifocal metastatic disease in the liver with intrinsic T1 hyperintensity, raising the question of metastatic melanoma based on signal characteristics, small size and multiplicity. Other differential considerations would include carcinoid tumor given the appearance on diffusion-weighted imaging. Area of concern in  the RIGHT kidney is not visualized on the current exam. Suggest close attention on follow-up to this location. Perhaps this represented a small area of focal nephritis. Could be that this is isointense to parenchyma on baseline images to slightly hyperintense and does not separated cell from the surrounding cortex well on post-contrast images though it cannot be seen on other sequences either on today's study. Hepatic steatosis. Electronically Signed   By: Zetta Bills M.D.   On: 09/15/2020 10:01   CT CHEST ABDOMEN PELVIS W CONTRAST  Result Date: 09/14/2020 CLINICAL DATA:  Intracranial metastases, no known primary malignancy EXAM: CT CHEST, ABDOMEN, AND PELVIS WITH CONTRAST TECHNIQUE: Multidetector CT imaging of the chest, abdomen and pelvis was performed following the standard protocol during bolus administration of intravenous contrast. CONTRAST:  162mL OMNIPAQUE IOHEXOL 300 MG/ML  SOLN COMPARISON:  03/13/2015 FINDINGS: CT CHEST FINDINGS Cardiovascular: The heart and great vessels are unremarkable without pericardial effusion. No evidence of aortic aneurysm or dissection. Mediastinum/Nodes: No enlarged mediastinal, hilar, or axillary  lymph nodes. Thyroid gland, trachea, and esophagus demonstrate no significant findings. Lungs/Pleura: Pulmonary nodules are as follows: 1.4 x 1.0 cm right upper lobe subpleural nodule image 51/6. 0.5 cm right lower lobe pulmonary nodule image 82/6. No airspace disease, effusion, or pneumothorax. The central airways are patent. Musculoskeletal: No acute or destructive bony lesions. Reconstructed images demonstrate no additional findings. CT ABDOMEN PELVIS FINDINGS Hepatobiliary: Multiple small hypodensities are seen within the liver parenchyma, too small to characterize but suspicious for metastatic disease given the intracranial findings. Hypodensities are seen within the right lobe liver image 46 and 49, measuring up to 11 mm in size. Hypodensity within the inferior margin of the right lobe liver image 76 measures 13 mm. No intrahepatic duct dilation. The gallbladder is unremarkable. Pancreas: Unremarkable. No pancreatic ductal dilatation or surrounding inflammatory changes. Spleen: Normal in size without focal abnormality. Adrenals/Urinary Tract: 1.6 cm hypodensity upper pole right kidney measures 23 Hounsfield units image 66/5, indeterminate. This is new since prior CT. The remainder of the kidneys enhance normally. No urinary tract calculi or obstruction. The adrenals and bladder are unremarkable. Stomach/Bowel: No bowel obstruction or ileus. Normal appendix right lower quadrant. No bowel wall thickening or inflammatory change. Vascular/Lymphatic: No significant vascular findings are present. No enlarged abdominal or pelvic lymph nodes. Reproductive: Prostate is unremarkable. Other: No free fluid or free gas. No abdominal wall hernia. Musculoskeletal: No acute or destructive bony lesions. Reconstructed images demonstrate no additional findings. IMPRESSION: 1. A 1.4 x 1.0 cm right upper lobe subpleural nodule, which could reflect primary or metastatic disease. PET-CT may be useful for follow-up. The 0.5 cm right  lower lobe nodule be too small to characterize by PET scan, and will require serial follow-up. 2. Multiple small hypodensities within the liver parenchyma, suspicious for liver metastases. 3. A 1.6 cm hypodensity upper pole right kidney, indeterminate. This could be further evaluated with MRI without and with contrast. Electronically Signed   By: Randa Ngo M.D.   On: 09/14/2020 22:15   EEG adult  Result Date: 09/15/2020 Lora Havens, MD     09/15/2020  3:13 PM Patient Name: Diedrich Iacono MRN: [id number] Epilepsy Attending: Lora Havens Referring Physician/Provider: Dr Gean Birchwood Date: 09/15/2020 Duration: 23.25 mins Patient history: 55yo M with seizure like activity. EEG to evaluate for seizure Level of alertness: Awake, asleep AEDs during EEG study: LEV Technical aspects: This EEG study was done with scalp electrodes positioned according to the 10-20 International system of electrode placement.  Electrical activity was acquired at a sampling rate of 500Hz  and reviewed with a high frequency filter of 70Hz  and a low frequency filter of 1Hz . EEG data were recorded continuously and digitally stored. Description: The posterior dominant rhythm consists of 9 Hz activity of moderate voltage (25-35 uV) seen predominantly in posterior head regions, symmetric and reactive to eye opening and eye closing. Sleep was characterized by vertex waves, sleep spindles (12 to 14 Hz), maximal frontocentral region. Hyperventilation and photic stimulation were not performed.   IMPRESSION: This study is within normal limits. No seizures or epileptiform discharges were seen throughout the recording. Lora Havens   CT HEAD CODE STROKE WO CONTRAST  Result Date: 09/14/2020 CLINICAL DATA:  Code stroke.  Acute neuro deficit.  Vomiting. EXAM: CT HEAD WITHOUT CONTRAST TECHNIQUE: Contiguous axial images were obtained from the base of the skull through the vertex without intravenous contrast. COMPARISON:  None.  FINDINGS: Brain: Multiple areas of hemorrhage in the brain. Many these are associated with mass lesions and edema. Appearance is most consistent with hemorrhagic metastasis. Largest lesion in the left frontal lobe measures 2.8 cm in diameter with mild hemorrhage and mild to moderate edema. Left occipital hemorrhage with surrounding edema measures 2.5 cm. Multiple additional smaller areas of hemorrhage are present in the frontal and parietal lobes bilaterally and in the right occipital lobe. Ventricle size normal. Mild midline shift to the right of approximately 3 mm. Vascular: Negative for hyperdense vessel Skull: No skeletal lesion identified in the calvarium Sinuses/Orbits: Paranasal sinuses clear.  Negative orbit. Other: None IMPRESSION: 1. 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 Mild midline shift to the right. 2. These results were called by telephone at the time of interpretation on 09/14/2020 at 9:44 pm to provider Rory Percy , who verbally acknowledged these results. Electronically Signed   By: Franchot Gallo M.D.   On: 09/14/2020 21:45    Lab Data:  CBC: Recent Labs  Lab 09/14/20 1042 09/14/20 2144 09/14/20 2148 09/15/20 0326 09/16/20 0307  WBC 15.1* 18.6*  --  16.5* 14.3*  NEUTROABS  --  15.8*  --   --   --   HGB 16.6 16.8 16.7 17.2* 16.0  HCT 49.2 46.1 49.0 49.7 46.4  MCV 91.6 89.2  --  90.9 90.1  PLT 304 325  --  284 0000000   Basic Metabolic Panel: Recent Labs  Lab 09/14/20 1042 09/14/20 2144 09/14/20 2148 09/15/20 0430 09/16/20 0307  NA 134* 131* 134* 138 136  K 4.3 3.4* 3.5 3.7 3.7  CL 100 97* 97* 101 100  CO2 20* 20*  --  23 25  GLUCOSE 136* 140* 139* 135* 128*  BUN 19 16 17 13 17   CREATININE 0.85 0.83 0.60* 0.91 0.88  CALCIUM 9.2 9.1  --  9.4 8.9  MG  --   --   --   --  2.2  PHOS  --   --   --   --  3.5   GFR: Estimated Creatinine Clearance: 113.4 mL/min (by C-G formula based on SCr of 0.88 mg/dL). Liver Function Tests: Recent  Labs  Lab 09/14/20 2144 09/15/20 0430 09/16/20 0307  AST 64* 82* 52*  ALT 102* 141* 139*  ALKPHOS 108 113 101  BILITOT 1.4* 1.6* 1.1  PROT 7.5 7.5 6.8  ALBUMIN 4.1 4.0 3.7   No results for input(s): LIPASE, AMYLASE in the last 168 hours. No results for input(s): AMMONIA in the last 168 hours. Coagulation Profile: Recent  Labs  Lab 09/14/20 2144  INR 1.1   Cardiac Enzymes: No results for input(s): CKTOTAL, CKMB, CKMBINDEX, TROPONINI in the last 168 hours. BNP (last 3 results) No results for input(s): PROBNP in the last 8760 hours. HbA1C: No results for input(s): HGBA1C in the last 72 hours. CBG: Recent Labs  Lab 09/14/20 2122  GLUCAP 134*   Lipid Profile: No results for input(s): CHOL, HDL, LDLCALC, TRIG, CHOLHDL, LDLDIRECT in the last 72 hours. Thyroid Function Tests: No results for input(s): TSH, T4TOTAL, FREET4, T3FREE, THYROIDAB in the last 72 hours. Anemia Panel: No results for input(s): VITAMINB12, FOLATE, FERRITIN, TIBC, IRON, RETICCTPCT in the last 72 hours. Urine analysis:    Component Value Date/Time   COLORURINE YELLOW 09/15/2020 0155   APPEARANCEUR CLEAR 09/15/2020 0155   LABSPEC 1.006 09/15/2020 0155   PHURINE 7.0 09/15/2020 0155   GLUCOSEU NEGATIVE 09/15/2020 0155   HGBUR MODERATE (A) 09/15/2020 0155   BILIRUBINUR NEGATIVE 09/15/2020 0155   KETONESUR NEGATIVE 09/15/2020 0155   PROTEINUR NEGATIVE 09/15/2020 0155   NITRITE NEGATIVE 09/15/2020 0155   LEUKOCYTESUR NEGATIVE 09/15/2020 0155     Tomi Bamberger Dion Sibal DO [facility] 09/16/2020, 11:12 AM   Call night coverage person covering after 7pm

## 2020-09-16 NOTE — Progress Notes (Addendum)
Neurology Progress Note  S: Feels better today. States left sided weakness is resolving and is basically only in LLE dorsiflexion now. HA has improved from 10 to 5.5/10. Describes as "in between throbbing and sharp". Located in frontal area. No n/v. Double vision has improved "greatly". States he is awaiting bx liver lesion per IR on Monday. No further seizure like activity or incontinence.   O: Current vital signs: BP 131/83 (BP Location: Right Arm)   Pulse 69   Temp 98.6 F (37 C) (Oral)   Resp 17   Ht 5' 11.5" (1.816 m)   Wt 98.9 kg   SpO2 95%   BMI 29.98 kg/m  Vital signs in last 24 hours: Temp:  [98 F (36.7 C)-99.1 F (37.3 C)] 98.6 F (37 C) (01/01 1105) Pulse Rate:  [46-86] 69 (01/01 1105) Resp:  [12-18] 17 (01/01 1105) BP: (109-135)/(50-107) 131/83 (01/01 1105) SpO2:  [93 %-98 %] 95 % (01/01 1105)  GENERAL: Awake, alert in NAD HEENT: Normocephalic and atraumatic, dry mm LUNGS: Normal respiratory effort.  CV: RRR. ABDOMEN: Soft, nontender Ext: warm  NEURO:  Mental Status: AA&Ox3  Speech/Language: speech is without dysarthria. No expressive or receptive aphasia noted. Naming, repetition, fluency, and comprehension intact. Cranial Nerves:  II: PERRL. Visual fields full. No double vision on exam.  III, IV, VI: EOMI. Eyelids elevate symmetrically.  V: Sensation is intact to light touch and symmetrical to face.  VII: Smile is symmetrical. Able to puff cheeks and raise eyebrows.  VIII: hearing intact to voice. IX, X: Palate elevates symmetrically. Phonation is normal.  IR:CVELFYBO shrug 5/5. XII: tongue is midline without fasciculations. Motor: 5/5 strength to all muscle groups except LLE foot dorsiflexion which is 4+/5.  Tone: is normal and bulk is normal Sensation- Intact to light touch bilaterally. Extinction intact.  Coordination: FTN intact bilaterally, HKS: slightly slower in LLE, but no noted ataxia. No drift.  DTRs: 2+ throughout Gait-  deferred   Medications  Current Facility-Administered Medications:  .  acetaminophen (TYLENOL) tablet 650 mg, 650 mg, Oral, Q6H PRN, Eduard Clos, MD .  dexamethasone (DECADRON) injection 4 mg, 4 mg, Intravenous, Q6H, Eduard Clos, MD, 4 mg at 09/16/20 1142 .  enoxaparin (LOVENOX) injection 40 mg, 40 mg, Subcutaneous, Q24H, Vann, Jessica U, DO, 40 mg at 09/16/20 1142 .  folic acid (FOLVITE) tablet 1 mg, 1 mg, Oral, Daily, Opyd, Lavone Neri, MD, 1 mg at 09/16/20 0804 .  hydrALAZINE (APRESOLINE) injection 10 mg, 10 mg, Intravenous, Q4H PRN, Eduard Clos, MD .  HYDROcodone-acetaminophen (NORCO/VICODIN) 5-325 MG per tablet 1-2 tablet, 1-2 tablet, Oral, Q4H PRN, Eduard Clos, MD, 1 tablet at 09/16/20 1142 .  HYDROmorphone (DILAUDID) injection 1 mg, 1 mg, Intravenous, Q3H PRN, Opyd, Lavone Neri, MD, 1 mg at 09/16/20 0807 .  levETIRAcetam (KEPPRA) tablet 500 mg, 500 mg, Oral, BID, Eduard Clos, MD, 500 mg at 09/16/20 0804 .  LORazepam (ATIVAN) injection 0-4 mg, 0-4 mg, Intravenous, Q6H, 1 mg at 09/15/20 1746 **FOLLOWED BY** [START ON 09/17/2020] LORazepam (ATIVAN) injection 0-4 mg, 0-4 mg, Intravenous, Q12H, Opyd, Timothy S, MD .  LORazepam (ATIVAN) tablet 1-4 mg, 1-4 mg, Oral, Q1H PRN **OR** LORazepam (ATIVAN) injection 1-4 mg, 1-4 mg, Intravenous, Q1H PRN, Opyd, Lavone Neri, MD .  multivitamin with minerals tablet 1 tablet, 1 tablet, Oral, Daily, Opyd, Lavone Neri, MD, 1 tablet at 09/16/20 0804 .  ondansetron (ZOFRAN) injection 4 mg, 4 mg, Intravenous, Q6H PRN, Eduard Clos, MD, 4 mg at  09/15/20 0451 .  pantoprazole (PROTONIX) EC tablet 40 mg, 40 mg, Oral, Q1200, Eduard Clos, MD, 40 mg at 09/16/20 1142 .  thiamine tablet 100 mg, 100 mg, Oral, Daily, 100 mg at 09/16/20 0804 **OR** thiamine (B-1) injection 100 mg, 100 mg, Intravenous, Daily, OpydLavone Neri, MD  Labs CBC    Component Value Date/Time   WBC 14.3 (H) 09/16/2020 0307   RBC 5.15 09/16/2020  0307   HGB 16.0 09/16/2020 0307   HCT 46.4 09/16/2020 0307   PLT 296 09/16/2020 0307   MCV 90.1 09/16/2020 0307   MCH 31.1 09/16/2020 0307   MCHC 34.5 09/16/2020 0307   RDW 12.0 09/16/2020 0307   LYMPHSABS 1.0 09/14/2020 2144   MONOABS 1.6 (H) 09/14/2020 2144   EOSABS 0.0 09/14/2020 2144   BASOSABS 0.0 09/14/2020 2144    CMP     Component Value Date/Time   NA 136 09/16/2020 0307   K 3.7 09/16/2020 0307   CL 100 09/16/2020 0307   CO2 25 09/16/2020 0307   GLUCOSE 128 (H) 09/16/2020 0307   BUN 17 09/16/2020 0307   CREATININE 0.88 09/16/2020 0307   CALCIUM 8.9 09/16/2020 0307   PROT 6.8 09/16/2020 0307   ALBUMIN 3.7 09/16/2020 0307   AST 52 (H) 09/16/2020 0307   ALT 139 (H) 09/16/2020 0307   ALKPHOS 101 09/16/2020 0307   BILITOT 1.1 09/16/2020 0307   GFRNONAA >60 09/16/2020 1062    Assessment:55 year old with no significant PMHx presenting with left sided weakness, double vision and an episode where he fell and had bladder incontinence but no witnessed seizure activity. 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  - Exam today is significantly improved on Dexamethasone.  - The episode of passing out could have been a seizure due cortical irritation from one of the metastatic lesions. This is of high enough likelihood to warrant anticonvulsant therapy.  - CT chest/abd/pel: A 1.4 x 1.0 cm right upper lobe subpleural nodule, which could reflect primary or metastatic disease. Multiple small hypodensities within the liver parenchyma, suspicious for liver metastases. A 1.6 cm hypodensity upper pole right kidney, indeterminate.  - CTA of head and neck: No large vessel occlusion, hemodynamically significant stenosis, or evidence of dissection.  - EEG: This study is within normal limits. No seizures or epileptiform discharges were seen throughout the recording. - Oncology Consultant recommended bx of liver lesion and will weigh in further after bx results.  IR plans for bx on Monday.   Recommendations: - MRI of the brain with and without contrast when able toget.   - Continue IV dexamethasone 4 mg every 6 hours for cerebral edema. - Continue Keppra 500 mg BID - Maintain seizure precautions - No antiplatelets or anticoagulants - Neurohospitalist service will sign off. Please call if there is a seizure or if MRI brain shows findings that require additional input from our service.    Pt seen by Jimmye Norman, MSN, APN-BC/Nurse Practitioner/Neuro and later by MD. Note and plan to be edited as needed by MD.  Pager: [phone]  Electronically signed: Dr. Caryl Pina

## 2020-09-17 ENCOUNTER — Encounter: Payer: Self-pay | Admitting: Radiation Oncology

## 2020-09-17 DIAGNOSIS — R030 Elevated blood-pressure reading, without diagnosis of hypertension: Secondary | ICD-10-CM | POA: Diagnosis not present

## 2020-09-17 NOTE — Progress Notes (Signed)
[facility]                                                                              Patient Demographics  Sean [name], is a 55 y.o. male, DOB - Feb 06, 1966, [id number]  Admit date - 09/14/2020   Admitting Physician Vianne Bulls, MD  Outpatient Primary MD for the patient is Deland Pretty, MD  Outpatient specialists:   LOS - 2  days   Medical records reviewed and are as summarized below:    Chief Complaint  Patient presents with  . Vomiting  . Loss of Consciousness       Brief summary   Patient is a 55 year old male with no significant past medical history, remote smoking history, not taking any medications regularly presented to ED with 1 to 2 weeks of malaise, more recent left-sided weakness, nausea, vomiting, and a brief loss of consciousness with urinary incontinence reported by EMS. He thought he had a viral illness and had 2 negative COVID tests prior to coming in. Reports smoking 1.5 ppd for 20 years before he quit. Drinks ~5 "generous" glasses of wine every day and has history of mild withdrawal symptoms. Does not know of any family history of cancer.  In ED, CT head showed multiple hemorrhagic metastasis with surrounding edema and mild midline shift to the right. CTA head and neck negative for large vessel occlusion. CT chest abdomen pelvis showed right upper lobe subpleural nodule which could reflect primary malignancy or metastasis, multiple liver lesion concerning for mass, indeterminate right renal hypodensity.  Patient was evaluated by neurology in ED and started on Decadron Keppra and Norco.   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  Found to have multiple brain metastasis with surrounding edema and mild midline shift to the  right -Appreciate neurology mentation, continue Decadron, Keppra, seizure precautions -Tumor markers including PSA, CEA, CA 19-9, AFP: unremarkable -MRI abdomen showed multifocal metastatic disease in the liver with intrinsic T1 hyperintensity raising the question of metastatic melanoma -Oncology consult  with Dr. [name], will await recommendations after biopsy -IR consult obtained, discussed with Dr. Laurence Ferrari, earliest biopsy can be done on Monday morning -will need MRI brain with and w/o contrast per neurology  Suspected seizure -Presented with LOC, urinary incontinence -Likely due to brain mets with surrounding edema and midline shift -Continue Keppra 500 mg twice daily, Decadron  Hyponatremia, hypokalemia -Resolved  Transaminitis -Possibly due to liver mets vs alcohol, follow closely  Alcohol dependence -Patient reported drinking 5 glasses of wine every day, has mild withdrawal symptoms -Continue CIWA with Ativan   Code Status: Full CODE STATUS DVT Prophylaxis: SCDs due to hemorrhagic mets to brain   Disposition Plan:     Status is: inpt  The patient will require care spanning > 2 midnights and should be moved to inpatient because: Inpatient level of care appropriate due to severity of illness  Dispo: The patient is from: Home  Anticipated d/c is to: Home              Anticipated d/c date is: 3 days              Patient currently is not medically stable to d/c.,  Pending biopsy, awaiting oncology recommendations     Time Spent in minutes   35 minutes  Procedures:  CTA, MRI abdomen  Consultants:   Oncology, Dr. [name] Interventional radiology   Medications  Scheduled Meds: . dexamethasone (DECADRON) injection  4 mg Intravenous Q6H  . docusate sodium  100 mg Oral Daily  . folic acid  1 mg Oral Daily  . levETIRAcetam  500 mg Oral BID  . LORazepam  0-4 mg Intravenous Q12H  .  morphine injection  2 mg Intravenous Once  . multivitamin with  minerals  1 tablet Oral Daily  . pantoprazole  40 mg Oral Q1200  . thiamine  100 mg Oral Daily   Or  . thiamine  100 mg Intravenous Daily   Continuous Infusions:  PRN Meds:.acetaminophen, hydrALAZINE, HYDROcodone-acetaminophen, HYDROmorphone (DILAUDID) injection, LORazepam **OR** LORazepam, ondansetron (ZOFRAN) IV, polyethylene glycol      Subjective:   Had a shower and is feeling good Headache improved  Objective:   Vitals:   09/16/20 1740 09/16/20 2340 09/17/20 0457 09/17/20 1208  BP: (!) 145/89 131/78 135/82 (!) 103/58  Pulse: 79 68 74 64  Resp: 16 17 17 17   Temp: 98.8 F (37.1 C) 98.8 F (37.1 C) 98.2 F (36.8 C) (!) 97.4 F (36.3 C)  TempSrc: Oral Oral Oral Oral  SpO2: 97% 95% 95% 95%  Weight:      Height:        Intake/Output Summary (Last 24 hours) at 09/17/2020 1501 Last data filed at 09/17/2020 0900 Gross per 24 hour  Intake 240 ml  Output 800 ml  Net -560 ml     Wt Readings from Last 3 Encounters:  09/14/20 98.9 kg  11/28/16 101.6 kg  11/12/16 101.7 kg     Exam   General: Appearance:     Overweight male in no acute distress     Lungs:     respirations unlabored  Heart:    Normal heart rate. Normal rhythm. No murmurs, rubs, or gallops.   MS:   All extremities are intact.   Neurologic:   Awake, alert, oriented x 3. No apparent focal neurological           defect.                       Data Reviewed:  I have personally reviewed following labs and imaging studies  Micro Results Recent Results (from the past 240 hour(s))  Resp Panel by RT-PCR (Flu A&B, Covid) Nasopharyngeal Swab     Status: None   Collection Time: 09/14/20  9:23 PM   Specimen: Nasopharyngeal Swab; Nasopharyngeal(NP) swabs in vial transport medium  Result Value Ref Range Status   SARS Coronavirus 2 by RT PCR NEGATIVE NEGATIVE Final    Comment: (NOTE) SARS-CoV-2 target nucleic acids are NOT DETECTED.  The SARS-CoV-2 RNA is generally detectable in upper  respiratory specimens during the acute phase of infection. The lowest concentration of SARS-CoV-2 viral copies this assay can detect is 138 copies/mL. A negative result does not preclude SARS-Cov-2 infection and should not be used as the sole basis for treatment or other patient management decisions. A negative result may occur with  improper specimen collection/handling, submission of  specimen other than nasopharyngeal swab, presence of viral mutation(s) within the areas targeted by this assay, and inadequate number of viral copies(<138 copies/mL). A negative result must be combined with clinical observations, patient history, and epidemiological information. The expected result is Negative.  Fact Sheet for Patients:  EntrepreneurPulse.com.au  Fact Sheet for Healthcare Providers:  IncredibleEmployment.be  This test is no t yet approved or cleared by the [place] FDA and  has been authorized for detection and/or diagnosis of SARS-CoV-2 by FDA under an Emergency Use Authorization (EUA). This EUA will remain  in effect (meaning this test can be used) for the duration of the COVID-19 declaration under Section 564(b)(1) of the Act, 21 U.S.C.section 360bbb-3(b)(1), unless the authorization is terminated  or revoked sooner.       Influenza A by PCR NEGATIVE NEGATIVE Final   Influenza B by PCR NEGATIVE NEGATIVE Final    Comment: (NOTE) The Xpert Xpress SARS-CoV-2/FLU/RSV plus assay is intended as an aid in the diagnosis of influenza from Nasopharyngeal swab specimens and should not be used as a sole basis for treatment. Nasal washings and aspirates are unacceptable for Xpert Xpress SARS-CoV-2/FLU/RSV testing.  Fact Sheet for Patients: EntrepreneurPulse.com.au  Fact Sheet for Healthcare Providers: IncredibleEmployment.be  This test is not yet approved or cleared by the [place] FDA and has been  authorized for detection and/or diagnosis of SARS-CoV-2 by FDA under an Emergency Use Authorization (EUA). This EUA will remain in effect (meaning this test can be used) for the duration of the COVID-19 declaration under Section 564(b)(1) of the Act, 21 U.S.C. section 360bbb-3(b)(1), unless the authorization is terminated or revoked.  Performed at [facility], [place] [address], [place], [place] [address]     Radiology Reports CT ANGIO HEAD W OR WO CONTRAST  Result Date: 09/14/2020 CLINICAL DATA:  Neuro deficit, acute, stroke suspected. EXAM: CT ANGIOGRAPHY HEAD AND NECK TECHNIQUE: Multidetector CT imaging of the head and neck was performed using the standard protocol during bolus administration of intravenous contrast. Multiplanar CT image reconstructions and MIPs were obtained to evaluate the vascular anatomy. Carotid stenosis measurements (when applicable) are obtained utilizing NASCET criteria, using the distal internal carotid diameter as the denominator. CONTRAST:  190mL OMNIPAQUE IOHEXOL 300 MG/ML  SOLN COMPARISON:  Head CT September 14, 2020. FINDINGS: CT HEAD FINDINGS Brain: Postcontrast images showed multiple hemorrhagic lesions (series 12), as seen on prior CT: - measuring 17 mm in the right frontal lobe with mild surrounding edema, image 28; - 6 mm in the left frontal lobe, minimal surrounding edema, image 25; - 5 mm in the right parietal lobe, no significant edema, image 23; - 15 mm the left parietal lobe, mild surrounding vasogenic edema, image 23; - 31 mm in the left frontal lobe with surrounding vasogenic edema and effacement of the adjacent cerebral sulci, image 20; - 27 mm in the left occipital lobe, decompressing into the occipital horn of the left lateral ventricle with surrounding vasogenic edema effacing the adjacent cerebral sulci, image 17. In addition to the previously seen hemorrhagic lesions, enhancing 2 small enhancing lesions are seen in the left frontal lobe measuring  2-3 mm each (image 26) and a 6 mm enhancing lesion is seen in the right occipital lobe (image 13). No hydrocephalus. Evaluation for subarachnoid hemorrhage limited due to intravenous contrast. Vascular: See below. Skull: Normal. Negative for fracture or focal lesion. Sinuses: Imaged portions are clear. Orbits: No acute finding. Review of the MIP images confirms the above findings CTA NECK  FINDINGS Aortic arch: Common origin of the innominate and left common carotid arteries from the aortic arch. The left vertebral artery origin is directly from the aortic arch. Imaged portion shows no evidence of aneurysm or dissection. No significant stenosis of the major arch vessel origins. Right carotid system: No evidence of dissection, stenosis (50% or greater) or occlusion. Left carotid system: No evidence of dissection, stenosis (50% or greater) or occlusion. Vertebral arteries: The left vertebral artery origin is directly from the aortic arch and is dominant. No evidence of dissection, stenosis (50% or greater) or occlusion. Skeleton: No aggressive lesion identified. Other neck: Negative. Upper chest: Negative. Review of the MIP images confirms the above findings CTA HEAD FINDINGS Anterior circulation: No significant stenosis, proximal occlusion, aneurysm, or vascular malformation. Posterior circulation: No significant stenosis, proximal occlusion, aneurysm, or vascular malformation. Venous sinuses: As permitted by contrast timing, patent. Review of the MIP images confirms the above findings IMPRESSION: 1. No large vessel occlusion, hemodynamically significant stenosis, or evidence of dissection. 2. Multiple hemorrhagic lesions are seen in the bilateral cerebral hemispheres as described above. Electronically Signed   By: Baldemar Lenis M.D.   On: 09/14/2020 22:24   CT ANGIO NECK W OR WO CONTRAST  Result Date: 09/14/2020 CLINICAL DATA:  Neuro deficit, acute, stroke suspected. EXAM: CT ANGIOGRAPHY HEAD AND  NECK TECHNIQUE: Multidetector CT imaging of the head and neck was performed using the standard protocol during bolus administration of intravenous contrast. Multiplanar CT image reconstructions and MIPs were obtained to evaluate the vascular anatomy. Carotid stenosis measurements (when applicable) are obtained utilizing NASCET criteria, using the distal internal carotid diameter as the denominator. CONTRAST:  OMNIPAQUE IOHEXOL 300 MG/ML  SOLN COMPARISON:  Head CT September 14, 2020. FINDINGS: CT HEAD FINDINGS Brain: Postcontrast images showed multiple hemorrhagic lesions (series 12), as seen on prior CT: - measuring 17 mm in the right frontal lobe with mild surrounding edema, image 28; - 6 mm in the left frontal lobe, minimal surrounding edema, image 25; - 5 mm in the right parietal lobe, no significant edema, image 23; - 15 mm the left parietal lobe, mild surrounding vasogenic edema, image 23; - 31 mm in the left frontal lobe with surrounding vasogenic edema and effacement of the adjacent cerebral sulci, image 20; - 27 mm in the left occipital lobe, decompressing into the occipital horn of the left lateral ventricle with surrounding vasogenic edema effacing the adjacent cerebral sulci, image 17. In addition to the previously seen hemorrhagic lesions, enhancing 2 small enhancing lesions are seen in the left frontal lobe measuring 2-3 mm each (image 26) and a 6 mm enhancing lesion is seen in the right occipital lobe (image 13). No hydrocephalus. Evaluation for subarachnoid hemorrhage limited due to intravenous contrast. Vascular: See below. Skull: Normal. Negative for fracture or focal lesion. Sinuses: Imaged portions are clear. Orbits: No acute finding. Review of the MIP images confirms the above findings CTA NECK FINDINGS Aortic arch: Common origin of the innominate and left common carotid arteries from the aortic arch. The left vertebral artery origin is directly from the aortic arch. Imaged portion shows no  evidence of aneurysm or dissection. No significant stenosis of the major arch vessel origins. Right carotid system: No evidence of dissection, stenosis (50% or greater) or occlusion. Left carotid system: No evidence of dissection, stenosis (50% or greater) or occlusion. Vertebral arteries: The left vertebral artery origin is directly from the aortic arch and is dominant. No evidence of dissection, stenosis (50%  or greater) or occlusion. Skeleton: No aggressive lesion identified. Other neck: Negative. Upper chest: Negative. Review of the MIP images confirms the above findings CTA HEAD FINDINGS Anterior circulation: No significant stenosis, proximal occlusion, aneurysm, or vascular malformation. Posterior circulation: No significant stenosis, proximal occlusion, aneurysm, or vascular malformation. Venous sinuses: As permitted by contrast timing, patent. Review of the MIP images confirms the above findings IMPRESSION: 1. No large vessel occlusion, hemodynamically significant stenosis, or evidence of dissection. 2. Multiple hemorrhagic lesions are seen in the bilateral cerebral hemispheres as described above. Electronically Signed   By: Pedro Earls M.D.   On: 09/14/2020 22:24   MR ABDOMEN W WO CONTRAST  Result Date: 09/15/2020 CLINICAL DATA:  Metastatic disease, indeterminate RIGHT renal lesion in this 55 year old male found to have intracranial abnormalities and abnormalities on recent CT of the chest, abdomen and pelvis in the abdomen EXAM: MRI ABDOMEN WITHOUT AND WITH CONTRAST TECHNIQUE: Multiplanar multisequence MR imaging of the abdomen was performed both before and after the administration of intravenous contrast. CONTRAST:  62mL GADAVIST GADOBUTROL 1 MMOL/ML IV SOLN COMPARISON:  CT chest, abdomen and pelvis of September 14, 2020 FINDINGS: Lower chest: Incidental imaging of the lung bases on MRI with limited assessment is unremarkable. No effusion. No consolidative changes. Hepatobiliary:  Numerous areas of diffusion related abnormalities in the hepatic parenchyma, for instance on image 97 of series 6 there are 4 distinct lesions 1 in the posterior RIGHT hepatic lobe likely corresponding to 1 of the areas seen on CT and 2 in the medial RIGHT hepatic lobe near the IVC confluence. Assessment of diffusion weighted images shows greater than 20 scattered foci of subtle restricted diffusion throughout the liver, subtle based on size There is a tiny focus of restricted diffusion along the margin of the posterior RIGHT hemi liver which may be extrahepatic (image 111, series 8) this measures approximately 5 mm. For reference the largest area of signal abnormality in the liver measures 11 mm on image 96 of series 8. The rest of these abnormalities are less than a cm and most within the 3-4 mm size range. The largest area of diffusion related signal abnormality shows peripheral enhancement and signs of washout measuring 16 mm (image 36, series 18) greatest axial dimension on the contrasted images. Other tiny foci are scattered throughout the liver showing enhancement though not as well seen is on diffusion related images. Scattered tiny foci show intrinsic T1 hyperintensity. No signs of biliary duct dilation or pericholecystic stranding. Pancreas: Normal intrinsic T1 signal. No ductal dilation or sign of inflammation. Spleen:  Normal size spleen without focal lesion. Adrenals/Urinary Tract:  Adrenal glands are normal. Symmetric renal enhancement. No hydronephrosis. No suspicious renal lesion. The area of concern in the RIGHT upper pole discussed on the previous CT evaluation is not visible on MRI showing subtle wedge-shaped characteristics but slightly more rounded than expected for infarct or pyelonephritis though potentially related focal pyelonephritis given that it is not visible on the current exam. Subtle stranding is noted in this location adjacent to the kidney. Stomach/Bowel: No acute gastrointestinal  process to the extent evaluated. Note that there is limited assessment of the gastrointestinal tract on MRI not performed for bowel evaluation. Vascular/Lymphatic: Patent abdominal vasculature. There is no gastrohepatic or hepatoduodenal ligament lymphadenopathy. No retroperitoneal or mesenteric lymphadenopathy. Other: Trace subtle indistinct appearance of the RIGHT posterior hepatic lobe. Area of restricted diffusion along the inferior RIGHT hepatic margin shows intrinsic T1 hyperintensity. Musculoskeletal: No suspicious bone lesions identified. IMPRESSION:  Multifocal metastatic disease in the liver with intrinsic T1 hyperintensity, raising the question of metastatic melanoma based on signal characteristics, small size and multiplicity. Other differential considerations would include carcinoid tumor given the appearance on diffusion-weighted imaging. Area of concern in the RIGHT kidney is not visualized on the current exam. Suggest close attention on follow-up to this location. Perhaps this represented a small area of focal nephritis. Could be that this is isointense to parenchyma on baseline images to slightly hyperintense and does not separated cell from the surrounding cortex well on post-contrast images though it cannot be seen on other sequences either on today's study. Hepatic steatosis. Electronically Signed   By: Zetta Bills M.D.   On: 09/15/2020 10:01   CT CHEST ABDOMEN PELVIS W CONTRAST  Result Date: 09/14/2020 CLINICAL DATA:  Intracranial metastases, no known primary malignancy EXAM: CT CHEST, ABDOMEN, AND PELVIS WITH CONTRAST TECHNIQUE: Multidetector CT imaging of the chest, abdomen and pelvis was performed following the standard protocol during bolus administration of intravenous contrast. CONTRAST:  160mL OMNIPAQUE IOHEXOL 300 MG/ML  SOLN COMPARISON:  03/13/2015 FINDINGS: CT CHEST FINDINGS Cardiovascular: The heart and great vessels are unremarkable without pericardial effusion. No evidence of  aortic aneurysm or dissection. Mediastinum/Nodes: No enlarged mediastinal, hilar, or axillary lymph nodes. Thyroid gland, trachea, and esophagus demonstrate no significant findings. Lungs/Pleura: Pulmonary nodules are as follows: 1.4 x 1.0 cm right upper lobe subpleural nodule image 51/6. 0.5 cm right lower lobe pulmonary nodule image 82/6. No airspace disease, effusion, or pneumothorax. The central airways are patent. Musculoskeletal: No acute or destructive bony lesions. Reconstructed images demonstrate no additional findings. CT ABDOMEN PELVIS FINDINGS Hepatobiliary: Multiple small hypodensities are seen within the liver parenchyma, too small to characterize but suspicious for metastatic disease given the intracranial findings. Hypodensities are seen within the right lobe liver image 46 and 49, measuring up to 11 mm in size. Hypodensity within the inferior margin of the right lobe liver image 76 measures 13 mm. No intrahepatic duct dilation. The gallbladder is unremarkable. Pancreas: Unremarkable. No pancreatic ductal dilatation or surrounding inflammatory changes. Spleen: Normal in size without focal abnormality. Adrenals/Urinary Tract: 1.6 cm hypodensity upper pole right kidney measures 23 Hounsfield units image 66/5, indeterminate. This is new since prior CT. The remainder of the kidneys enhance normally. No urinary tract calculi or obstruction. The adrenals and bladder are unremarkable. Stomach/Bowel: No bowel obstruction or ileus. Normal appendix right lower quadrant. No bowel wall thickening or inflammatory change. Vascular/Lymphatic: No significant vascular findings are present. No enlarged abdominal or pelvic lymph nodes. Reproductive: Prostate is unremarkable. Other: No free fluid or free gas. No abdominal wall hernia. Musculoskeletal: No acute or destructive bony lesions. Reconstructed images demonstrate no additional findings. IMPRESSION: 1. A 1.4 x 1.0 cm right upper lobe subpleural nodule, which  could reflect primary or metastatic disease. PET-CT may be useful for follow-up. The 0.5 cm right lower lobe nodule be too small to characterize by PET scan, and will require serial follow-up. 2. Multiple small hypodensities within the liver parenchyma, suspicious for liver metastases. 3. A 1.6 cm hypodensity upper pole right kidney, indeterminate. This could be further evaluated with MRI without and with contrast. Electronically Signed   By: Randa Ngo M.D.   On: 09/14/2020 22:15   EEG adult  Result Date: 09/15/2020 Lora Havens, MD     09/15/2020  3:13 PM Patient Name: Casimiro Blum MRN: [id number] Epilepsy Attending: Lora Havens Referring Physician/Provider: Dr Gean Birchwood Date: 09/15/2020 Duration: 23.25 mins Patient history:  55yo M with seizure like activity. EEG to evaluate for seizure Level of alertness: Awake, asleep AEDs during EEG study: LEV Technical aspects: This EEG study was done with scalp electrodes positioned according to the 10-20 International system of electrode placement. Electrical activity was acquired at a sampling rate of 500Hz  and reviewed with a high frequency filter of 70Hz  and a low frequency filter of 1Hz . EEG data were recorded continuously and digitally stored. Description: The posterior dominant rhythm consists of 9 Hz activity of moderate voltage (25-35 uV) seen predominantly in posterior head regions, symmetric and reactive to eye opening and eye closing. Sleep was characterized by vertex waves, sleep spindles (12 to 14 Hz), maximal frontocentral region. Hyperventilation and photic stimulation were not performed.   IMPRESSION: This study is within normal limits. No seizures or epileptiform discharges were seen throughout the recording. Charlsie Quest   CT HEAD CODE STROKE WO CONTRAST  Result Date: 09/14/2020 CLINICAL DATA:  Code stroke.  Acute neuro deficit.  Vomiting. EXAM: CT HEAD WITHOUT CONTRAST TECHNIQUE: Contiguous axial images were obtained  from the base of the skull through the vertex without intravenous contrast. COMPARISON:  None. FINDINGS: Brain: Multiple areas of hemorrhage in the brain. Many these are associated with mass lesions and edema. Appearance is most consistent with hemorrhagic metastasis. Largest lesion in the left frontal lobe measures 2.8 cm in diameter with mild hemorrhage and mild to moderate edema. Left occipital hemorrhage with surrounding edema measures 2.5 cm. Multiple additional smaller areas of hemorrhage are present in the frontal and parietal lobes bilaterally and in the right occipital lobe. Ventricle size normal. Mild midline shift to the right of approximately 3 mm. Vascular: Negative for hyperdense vessel Skull: No skeletal lesion identified in the calvarium Sinuses/Orbits: Paranasal sinuses clear.  Negative orbit. Other: None IMPRESSION: 1. 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 Mild midline shift to the right. 2. These results were called by telephone at the time of interpretation on 09/14/2020 at 9:44 pm to provider Wilford Corner , who verbally acknowledged these results. Electronically Signed   By: Marlan Palau M.D.   On: 09/14/2020 21:45    Lab Data:  CBC: Recent Labs  Lab 09/14/20 1042 09/14/20 2144 09/14/20 2148 09/15/20 0326 09/16/20 0307  WBC 15.1* 18.6*  --  16.5* 14.3*  NEUTROABS  --  15.8*  --   --   --   HGB 16.6 16.8 16.7 17.2* 16.0  HCT 49.2 46.1 49.0 49.7 46.4  MCV 91.6 89.2  --  90.9 90.1  PLT 304 325  --  284 296   Basic Metabolic Panel: Recent Labs  Lab 09/14/20 1042 09/14/20 2144 09/14/20 2148 09/15/20 0430 09/16/20 0307  NA 134* 131* 134* 138 136  K 4.3 3.4* 3.5 3.7 3.7  CL 100 97* 97* 101 100  CO2 20* 20*  --  23 25  GLUCOSE 136* 140* 139* 135* 128*  BUN 19 16 17 13 17   CREATININE 0.85 0.83 0.60* 0.91 0.88  CALCIUM 9.2 9.1  --  9.4 8.9  MG  --   --   --   --  2.2  PHOS  --   --   --   --  3.5   GFR: Estimated Creatinine  Clearance: 113.4 mL/min (by C-G formula based on SCr of 0.88 mg/dL). Liver Function Tests: Recent Labs  Lab 09/14/20 2144 09/15/20 0430 09/16/20 0307  AST 64* 82* 52*  ALT 102* 141* 139*  ALKPHOS 108 113 101  BILITOT 1.4* 1.6* 1.1  PROT 7.5 7.5 6.8  ALBUMIN 4.1 4.0 3.7   No results for input(s): LIPASE, AMYLASE in the last 168 hours. No results for input(s): AMMONIA in the last 168 hours. Coagulation Profile: Recent Labs  Lab 09/14/20 2144  INR 1.1   Cardiac Enzymes: No results for input(s): CKTOTAL, CKMB, CKMBINDEX, TROPONINI in the last 168 hours. BNP (last 3 results) No results for input(s): PROBNP in the last 8760 hours. HbA1C: No results for input(s): HGBA1C in the last 72 hours. CBG: Recent Labs  Lab 09/14/20 2122  GLUCAP 134*   Lipid Profile: No results for input(s): CHOL, HDL, LDLCALC, TRIG, CHOLHDL, LDLDIRECT in the last 72 hours. Thyroid Function Tests: No results for input(s): TSH, T4TOTAL, FREET4, T3FREE, THYROIDAB in the last 72 hours. Anemia Panel: No results for input(s): VITAMINB12, FOLATE, FERRITIN, TIBC, IRON, RETICCTPCT in the last 72 hours. Urine analysis:    Component Value Date/Time   COLORURINE YELLOW 09/15/2020 0155   APPEARANCEUR CLEAR 09/15/2020 0155   LABSPEC 1.006 09/15/2020 0155   PHURINE 7.0 09/15/2020 0155   GLUCOSEU NEGATIVE 09/15/2020 0155   HGBUR MODERATE (A) 09/15/2020 0155   BILIRUBINUR NEGATIVE 09/15/2020 0155   KETONESUR NEGATIVE 09/15/2020 0155   PROTEINUR NEGATIVE 09/15/2020 0155   NITRITE NEGATIVE 09/15/2020 0155   LEUKOCYTESUR NEGATIVE 09/15/2020 0155     Geradine Girt DO [facility] 09/17/2020, 3:01 PM   Call night coverage person covering after 7pm

## 2020-09-17 NOTE — Progress Notes (Signed)
  Radiation Oncology         [phone] ________________________________  Name: Sean [name] MRN: [id number]  Date: 09/17/2020  DOB: 07-Apr-1966  Documentation note   DIAGNOSIS: Hemorrhagic brain metastasis  NARRATIVE:  The patient may be a potential SRS candidate.  We will discuss with the [facility] team tomorrow morning.  If he is a SRS candidate then he will need to have a 3T MRI prior to planning and treatment, which could  be done at [facility], possibly while an inpatient.   -----------------------------------  Billie Lade, PhD, MD

## 2020-09-18 ENCOUNTER — Other Ambulatory Visit: Payer: Self-pay | Admitting: Radiation Therapy

## 2020-09-18 ENCOUNTER — Inpatient Hospital Stay (HOSPITAL_COMMUNITY): Payer: 59

## 2020-09-18 ENCOUNTER — Other Ambulatory Visit: Payer: Self-pay

## 2020-09-18 DIAGNOSIS — R030 Elevated blood-pressure reading, without diagnosis of hypertension: Secondary | ICD-10-CM | POA: Diagnosis not present

## 2020-09-18 IMAGING — MR MR HEAD WO/W CM
15 of 18 series · 25 of 48 positions shown · IV contrast (gadavist)
Comparison: CT head [DATE]

CLINICAL DATA: Brain lesions.

EXAM:
MRI HEAD WITHOUT AND WITH CONTRAST
TECHNIQUE: Multiplanar, multiecho pulse sequences of the brain and surrounding
structures were obtained without and with intravenous contrast.
CONTRAST:  10mL GADAVIST GADOBUTROL 1 MMOL/ML IV SOLN

[Series 2: DWI · axial · 3.0mm · 0.94mm/px · z∈[-55,+96]mm · 2 of 104 slices shown (1 of 4)]
[im 1/104]
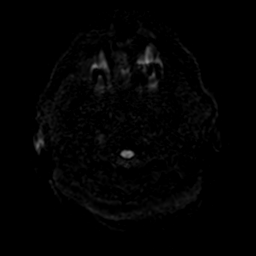
[im 104/104]
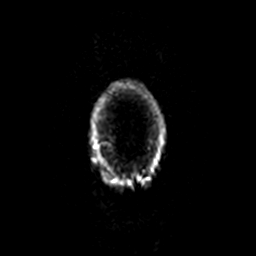

[Series 3: DWI · coronal · 4.0mm · 0.94mm/px · 2 of 74 slices shown (2 of 4)]
[im 1/74]
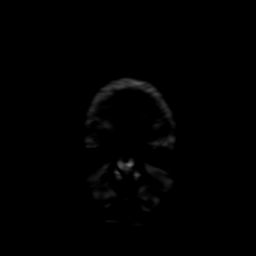
[im 74/74]
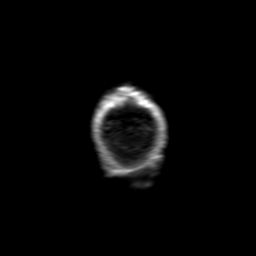

[Series 4: FLAIR · sagittal · 3.0mm · 0.47mm/px · 1 of 37 slices shown (1 of 2)]
[im 1/37]
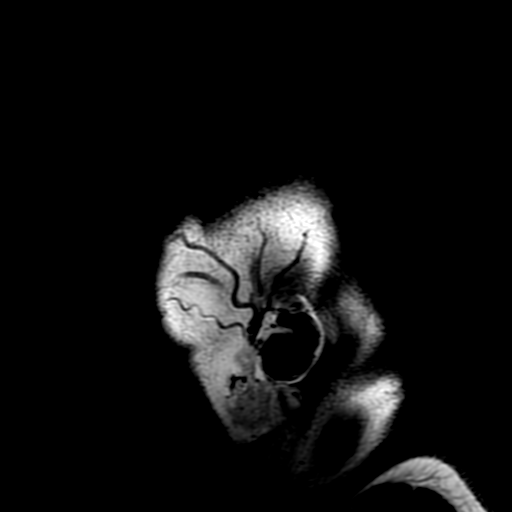

[Series 5: FLAIR · axial · 3.0mm · 0.47mm/px · 1 of 58 slices shown (2 of 2)]
[im 1/58]
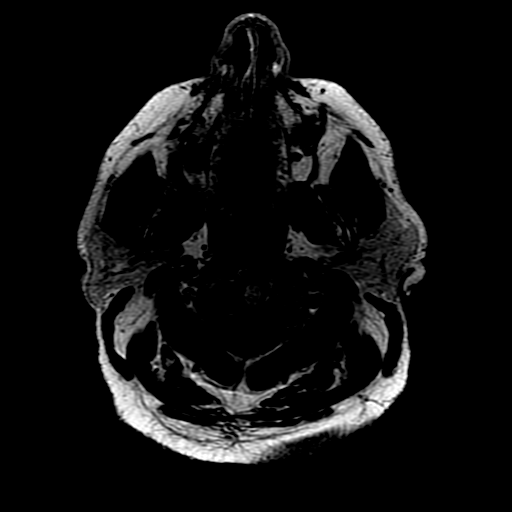

[Series 6: SWI · axial · 3.0mm · 0.47mm/px · z∈[-60,+93]mm · 2 of 104 slices shown (1 of 2)]
[im 1/104]
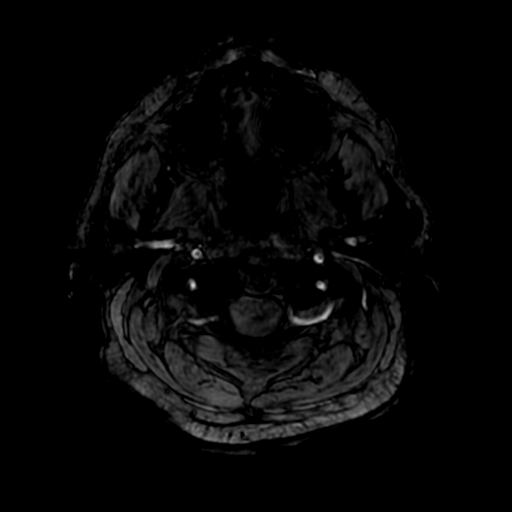
[im 104/104]
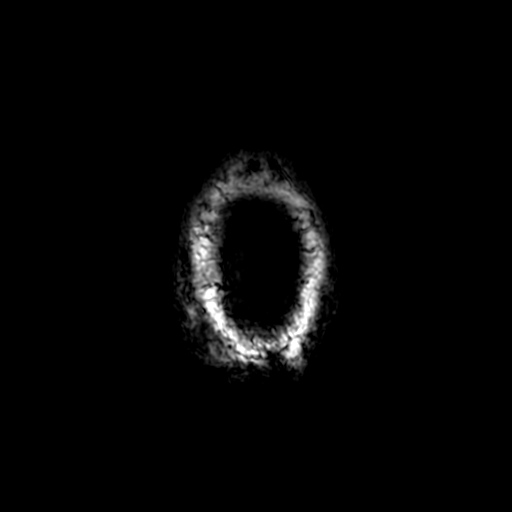

[Series 8: T2 post-contrast · coronal · 3.0mm · 0.39mm/px · 1 of 46 slices shown (1 of 2)]
[im 1/46]
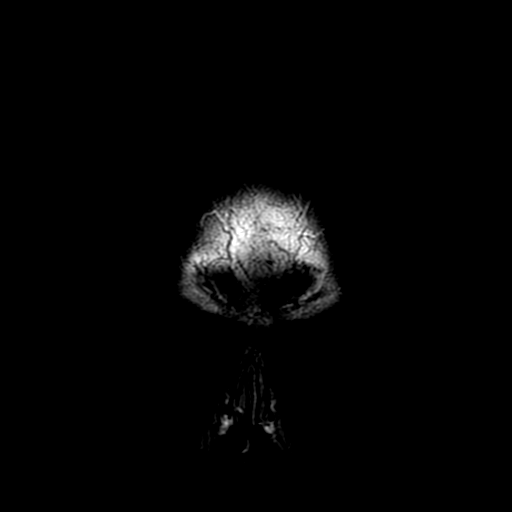

[Series 9: T2 post-contrast · axial · 5.0mm · 0.47mm/px · 1 of 29 slices shown (2 of 2)]
[im 1/29]
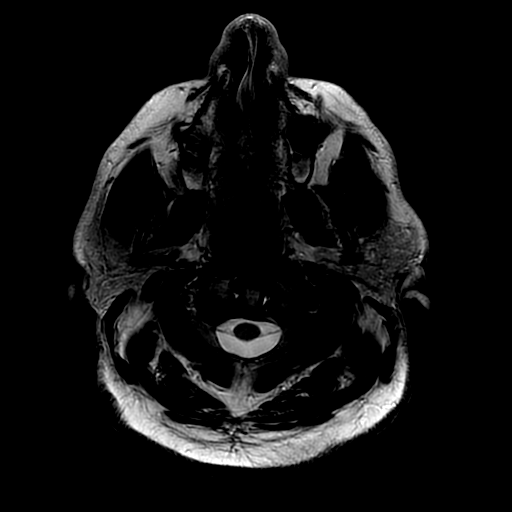

[Series 10: T1 post-contrast · coronal · 3.0mm · 0.43mm/px · 1 of 46 slices shown]
[im 1/46]
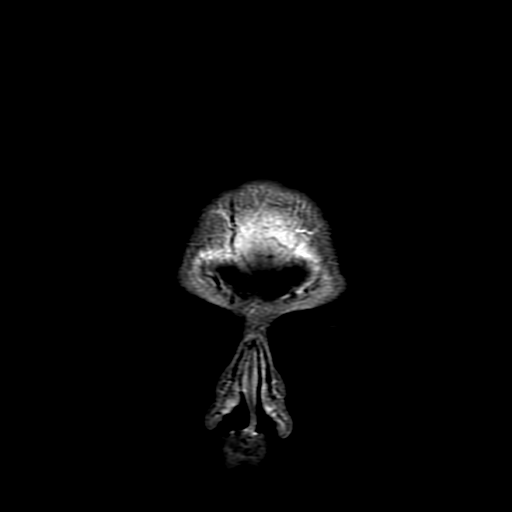

[Series 11: FLAIR post-contrast · sagittal · 3.0mm · 0.47mm/px · 1 of 37 slices shown]
[im 1/37]
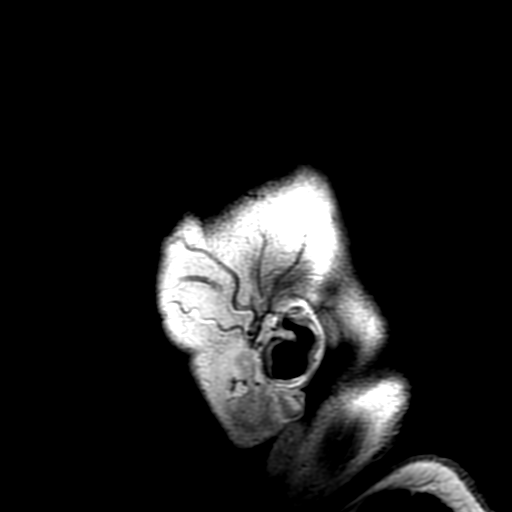

[Series 210: DWI · axial · 3.0mm · 0.94mm/px · z∈[-55,+96]mm · 2 of 104 slices shown (3 of 4)]
[im 1/104]
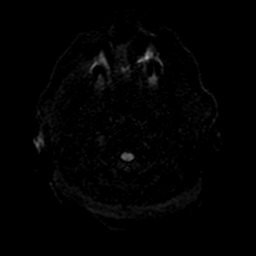
[im 104/104]
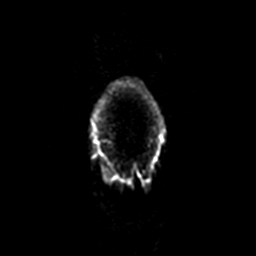

[Series 250: ADC · axial · 3.0mm · 0.94mm/px · 1 of 52 slices shown (1 of 2)]
[im 1/52]
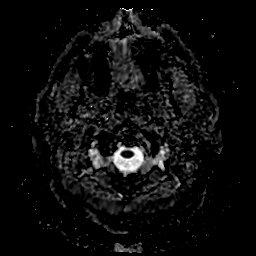

[Series 310: DWI · coronal · 4.0mm · 0.94mm/px · 2 of 74 slices shown (4 of 4)]
[im 1/74]
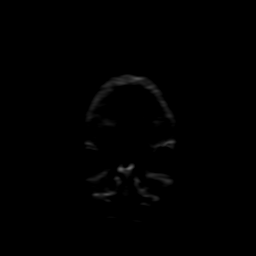
[im 74/74]
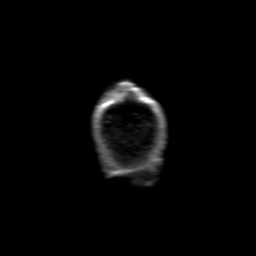

[Series 350: ADC · coronal · 4.0mm · 0.94mm/px · 1 of 37 slices shown (2 of 2)]
[im 1/37]
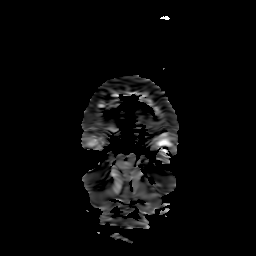

[Series 600: SWI · axial · 3.0mm · 0.47mm/px · z∈[-60,+93]mm · 2 of 104 slices shown (2 of 2)]
[im 1/104]
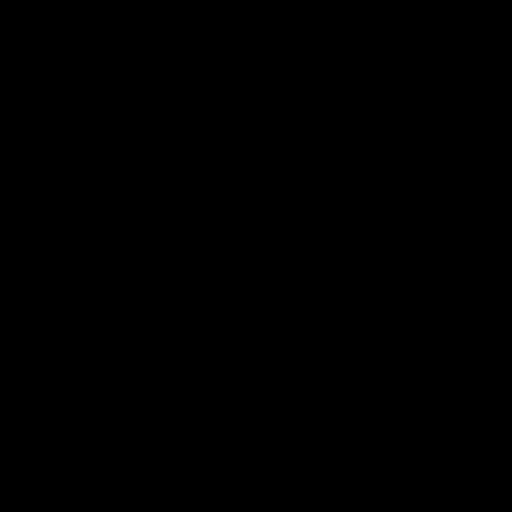
[im 104/104]
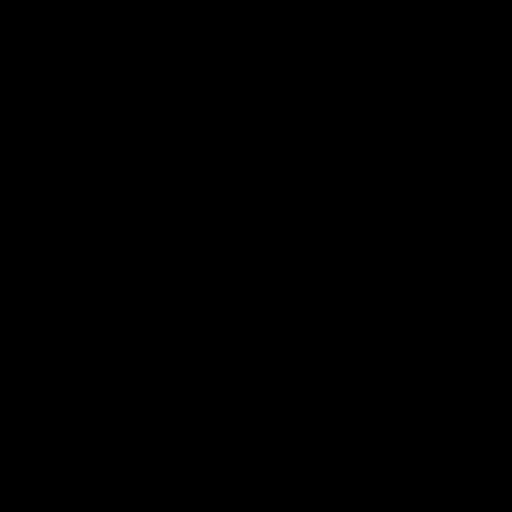

[Series 700: multiplanar reconstruction (mpr) · axial · 0.9mm · 0.50mm/px · z∈[-100,+70]mm · 5 of 302 slices shown]
[im 1/302]
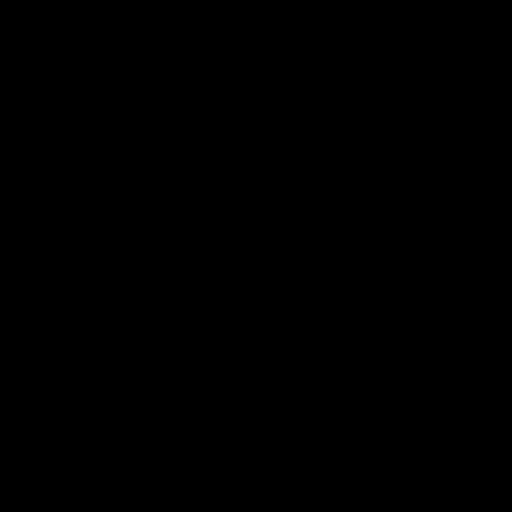
[im 51/302]
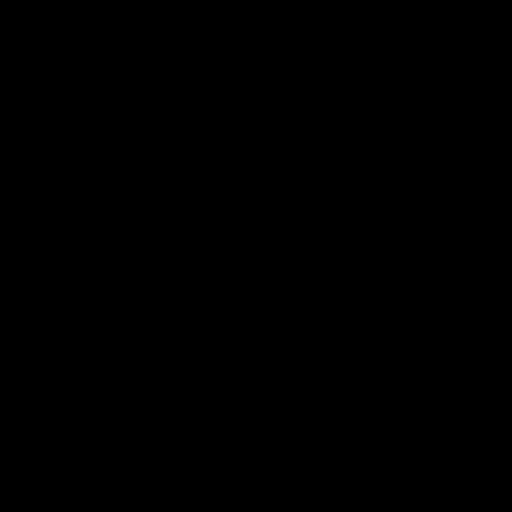
[im 101/302]
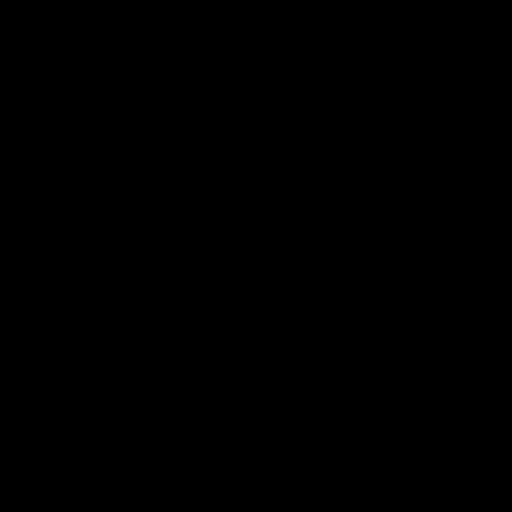
[im 151/302]
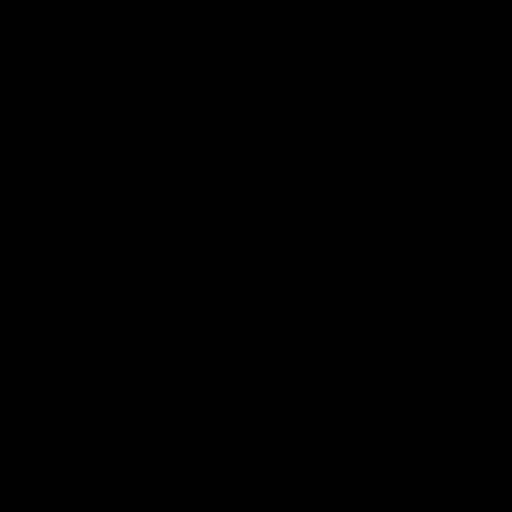
[im 201/302]
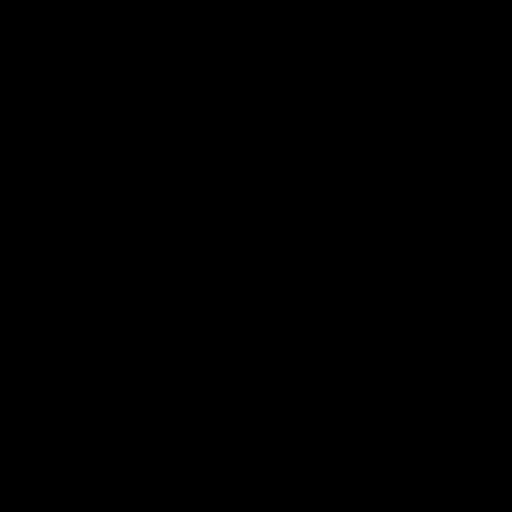

[25 of 48 positions shown; findings below may reference images not displayed]

FINDINGS: Brain: Multiple hemorrhagic mass lesions in the brain with
surrounding edema. These all show T1 shortening due to methemoglobin
or DURON which could be obscuring enhancement.

2.5 cm left frontal lesion with surrounding edema. This does show a
small satellite enhancing nodule laterally measuring about 6 mm.

37 x 19 mm mass in the left occipital lobe with hemorrhage. This
lesion does show scattered areas of enhancement particularly
extending towards the left occipital horn. There is surrounding
edema.

Multiple additional hemorrhagic lesions are seen in the brain

5 mm hemorrhagic lesion right occipital lobe axial image 151

3 mm lesion right superior cerebellum axial image 138

12 mm lesion left parietal lobe image 194

6 mm hemorrhagic lesion left frontal lobe axial image 201

5 mm hemorrhagic lesion right medial parietal lobe axial image 203

3 mm lesion left posterior parietal lobe. 4 mm lesion left parietal
lobe.

3 mm lesion left lateral parietal lobe axial image 197

Ventricle size normal. No midline shift. Negative for acute infarct.

Vascular: Normal arterial flow voids.

Skull and upper cervical spine: No focal skeletal abnormality.

Sinuses/Orbits: Paranasal sinuses clear.  Negative orbit

Other: None
IMPRESSION: Multiple hemorrhagic lesions in the brain with surrounding edema
most compatible with metastatic disease. All lesions contain T1
hyperintensity compatible with methemoglobin which could obscure
enhancement which is difficult to see on most lesions. However there
is enhancement of a small satellite lesion in the left frontal lobe
and also enhancement of a hemorrhagic lesion in the left parietal
lobe extending towards the occipital horn. Findings compatible with
metastatic disease. Possible melanoma.

## 2020-09-18 IMAGING — US US BIOPSY CORE LIVER
1 series · 13 of 25 positions shown · non-contrast
Comparison: MRI abdomen from [DATE]

INDICATION: 54-year-old male with multiple metastatic lesions of unknown
primary.

EXAM:
ULTRASOUND GUIDED LIVER LESION BIOPSY

[Series 1: us biopsy (liver) · 13 of 26 slices shown]
[im 1/26]
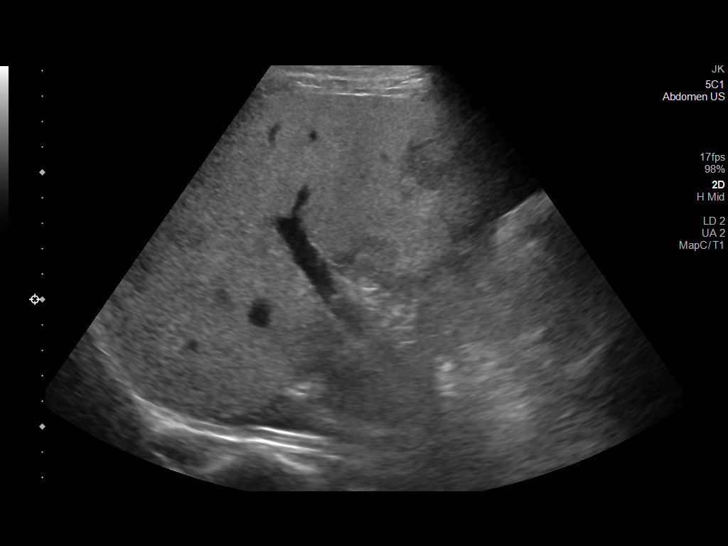
[im 3/26]
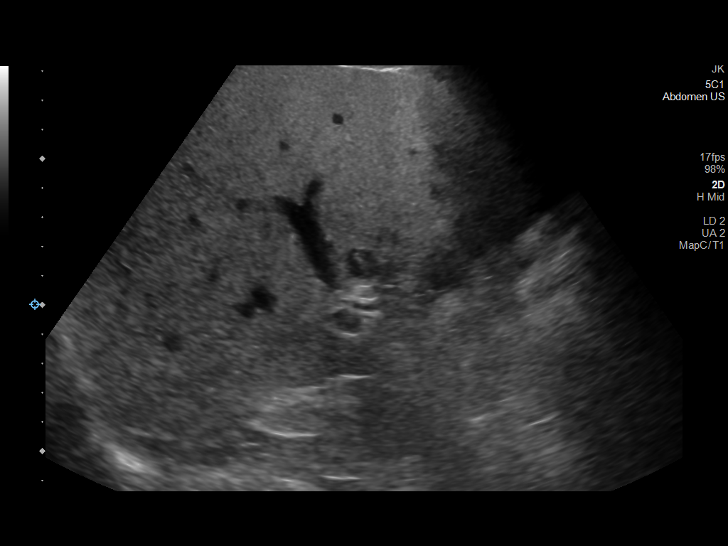
[im 5/26]
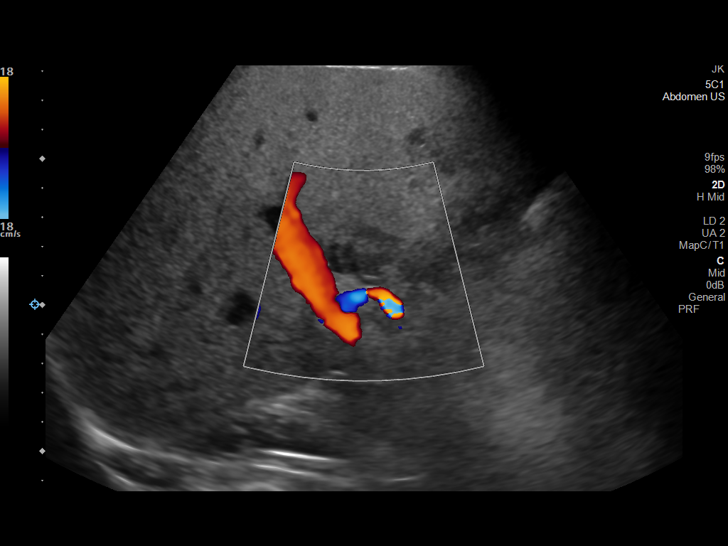
[im 7/26]
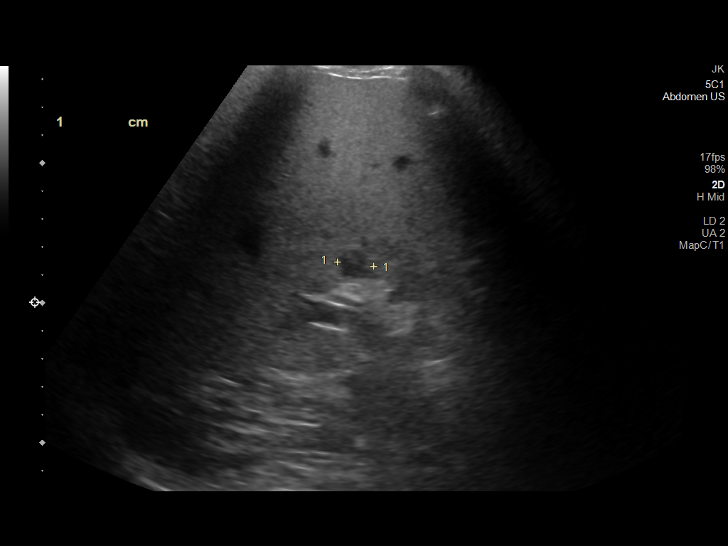
[im 9/26]
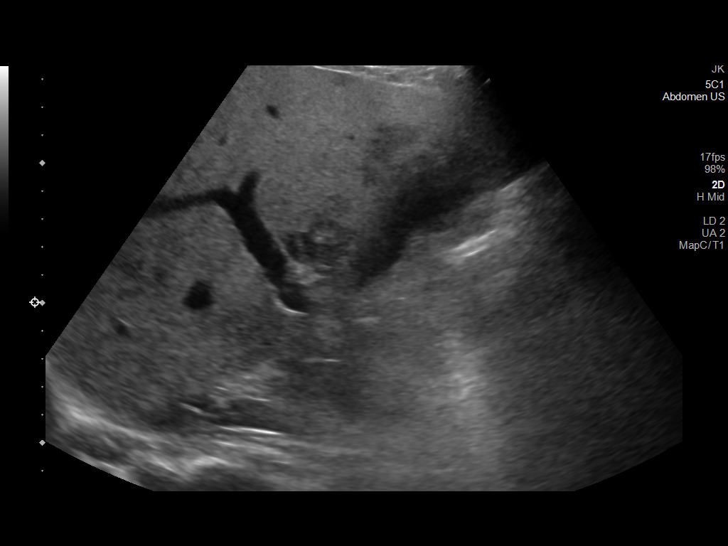
[im 11/26]
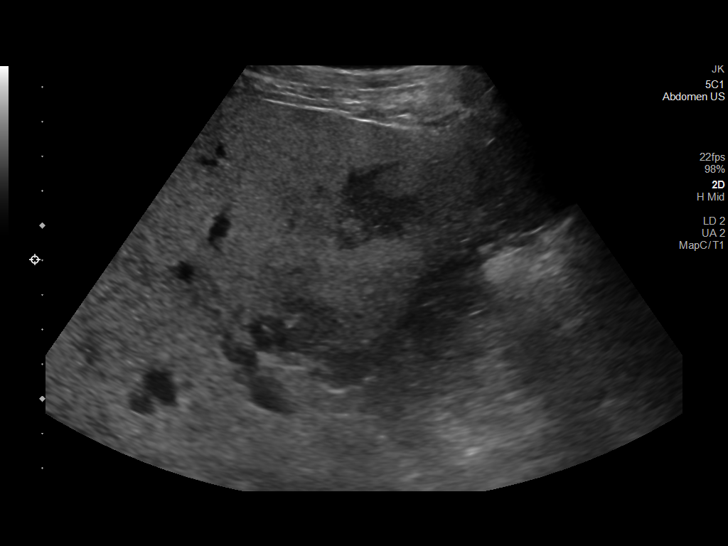
[im 13/26]
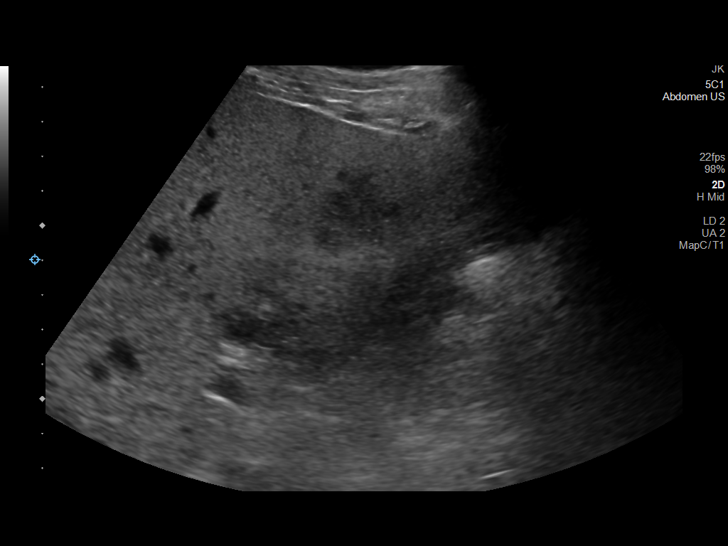
[im 15/26]
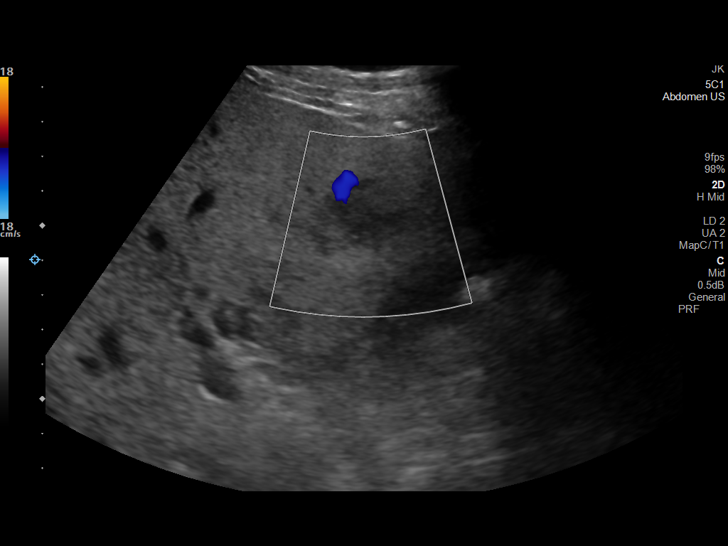
[im 17/26]
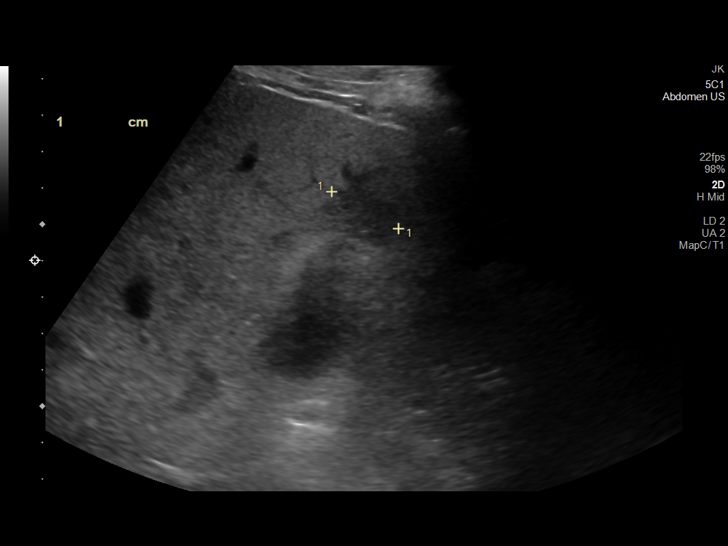
[im 19/26]
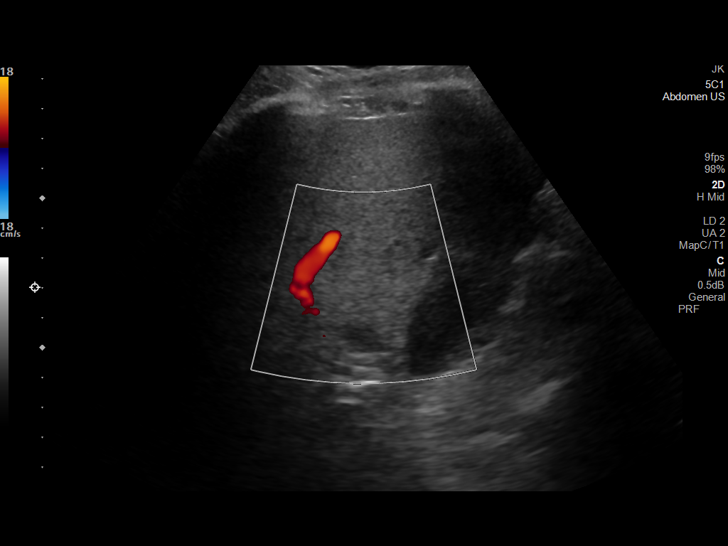
[im 21/26]
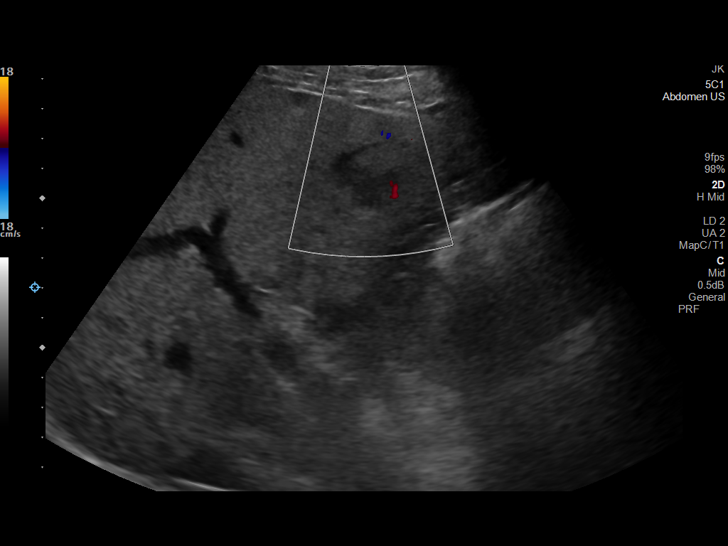
[im 23/26]
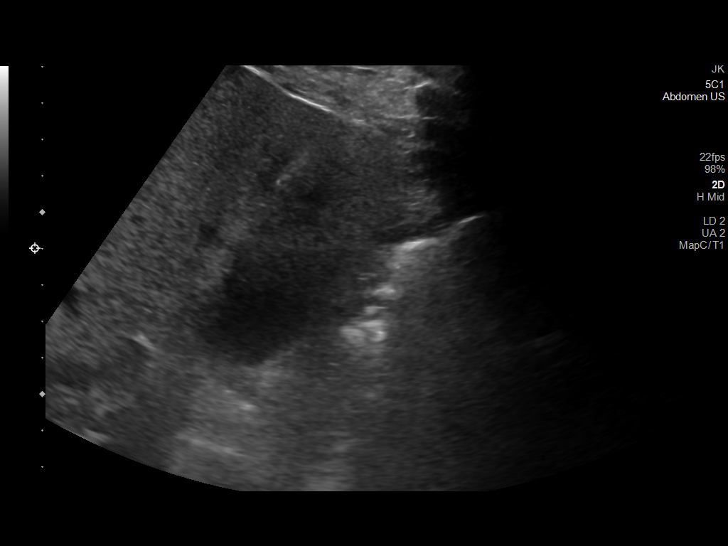
[im 26/26]
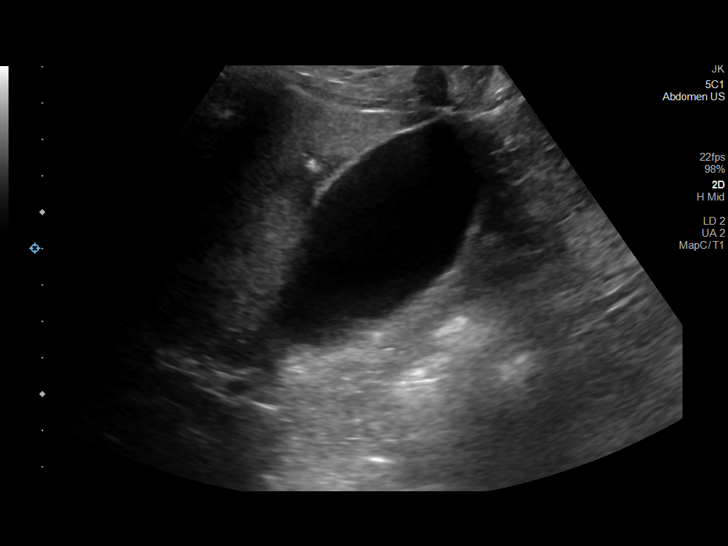

[13 of 25 positions shown; findings below may reference images not displayed]

MEDICATIONS:
None

ANESTHESIA/SEDATION:
Moderate (conscious) sedation was employed during this procedure. A
total of Versed 1 mg and Fentanyl 50 mcg was administered
intravenously.

Moderate Sedation Time: 16 minutes. The patient's level of
consciousness and vital signs were monitored continuously by
radiology nursing throughout the procedure under my direct
supervision.

COMPLICATIONS:
None immediate.

PROCEDURE:
Informed written consent was obtained from the patient after a
discussion of the risks, benefits and alternatives to treatment. The
patient understands and consents the procedure. A timeout was
performed prior to the initiation of the procedure.

Ultrasound scanning was performed of the right upper abdominal
quadrant demonstrates a 0.9 cm hypoechoic mass near the portal
[REDACTED]. The most conspicuous lesion visualized on MRI in the hepatic
dome was unable to be visualized sonographically. There is an
additional, ill-defined hypoechoic mass just superior to the
gallbladder in segment 4 B which measures approximately 1.9 cm in
maximum dimension.

The segment 4B lesion was selected for biopsy and the procedure was
planned. The right upper abdominal quadrant was prepped and draped
in the usual sterile fashion. The overlying soft tissues were
anesthetized with 1% lidocaine with epinephrine. A 17 gauge, 6.8 cm
co-axial needle was advanced into a peripheral aspect of the lesion.
This was followed by a total of 3 core biopsies with an 18 gauge
core device under direct ultrasound guidance.

The coaxial needle tract was embolized with a small amount of
Gel-Foam slurry and superficial hemostasis was obtained with manual
compression. Post procedural scanning was negative for definitive
area of hemorrhage or additional complication. A dressing was
placed. The patient tolerated the procedure well without immediate
post procedural complication.
IMPRESSION: Technically successful ultrasound guided core needle biopsy of
hypoechoic mass in segment 4 B, presumed metastatic lesion.

## 2020-09-18 MED ORDER — HYDROCODONE-ACETAMINOPHEN 5-325 MG PO TABS
1.0000 | ORAL_TABLET | ORAL | 0 refills | Status: DC | PRN
Start: 2020-09-18 — End: 2020-09-20

## 2020-09-18 MED ORDER — MIDAZOLAM HCL 2 MG/2ML IJ SOLN
INTRAMUSCULAR | Status: AC
  Filled 2020-09-18: qty 2

## 2020-09-18 MED ORDER — ADULT MULTIVITAMIN W/MINERALS CH: 1.0000 | ORAL_TABLET | Freq: Every day | ORAL | Status: DC

## 2020-09-18 MED ORDER — ONDANSETRON HCL 4 MG PO TABS: 4.0000 mg | ORAL_TABLET | Freq: Every day | ORAL | 0 refills | Status: DC | PRN

## 2020-09-18 MED ORDER — GADOBUTROL 1 MMOL/ML IV SOLN
10.0000 mL | Freq: Once | INTRAVENOUS | Status: AC | PRN
  Administered 2020-09-18: 10 mL via INTRAVENOUS

## 2020-09-18 MED ORDER — PANTOPRAZOLE SODIUM 40 MG PO TBEC: 40.0000 mg | DELAYED_RELEASE_TABLET | Freq: Every day | ORAL | 0 refills | Status: DC

## 2020-09-18 MED ORDER — LEVETIRACETAM 500 MG PO TABS: 500.0000 mg | ORAL_TABLET | Freq: Two times a day (BID) | ORAL | 0 refills | Status: DC

## 2020-09-18 MED ORDER — FENTANYL CITRATE (PF) 100 MCG/2ML IJ SOLN
INTRAMUSCULAR | Status: AC
  Filled 2020-09-18: qty 2

## 2020-09-18 MED ORDER — MIDAZOLAM HCL 2 MG/2ML IJ SOLN
INTRAMUSCULAR | Status: AC | PRN
  Administered 2020-09-18: 1 mg via INTRAVENOUS

## 2020-09-18 MED ORDER — DEXAMETHASONE 4 MG PO TABS: 4.0000 mg | ORAL_TABLET | Freq: Two times a day (BID) | ORAL | 0 refills | Status: DC

## 2020-09-18 MED ORDER — LIDOCAINE HCL (PF) 1 % IJ SOLN
INTRAMUSCULAR | Status: AC
  Filled 2020-09-18: qty 30

## 2020-09-18 MED ORDER — GELATIN ABSORBABLE 12-7 MM EX MISC
CUTANEOUS | Status: AC
  Filled 2020-09-18: qty 1

## 2020-09-18 MED ORDER — FENTANYL CITRATE (PF) 100 MCG/2ML IJ SOLN
INTRAMUSCULAR | Status: AC | PRN
  Administered 2020-09-18: 50 ug via INTRAVENOUS

## 2020-09-18 MED FILL — levETIRAcetam 500 MG TABS: 500 | 30 days supply | Qty: 60 | Fill #0

## 2020-09-18 MED FILL — HYDROCODON-APAP 5-325: 5-325 | 4 days supply | Qty: 30 | Fill #0

## 2020-09-18 MED FILL — DEXAMETHASONE 4 MG TABLET: 4 | 30 days supply | Qty: 60 | Fill #0

## 2020-09-18 MED FILL — PANTOPRAZOLE SOD DR 40 MG T: 40 | 30 days supply | Qty: 30 | Fill #0

## 2020-09-18 MED FILL — ONDANSETRON HCL 4 MG TABLET: 4 | 30 days supply | Qty: 30 | Fill #0

## 2020-09-18 NOTE — Procedures (Signed)
Interventional Radiology Procedure Note  Procedure: Ultrasound guided liver mass biopsy  Findings: Please refer to procedural dictation for full description. Hypoechoic mass just superior to gallbladder biopsied with 18 ga core x3.  Samples placed in formalin and sent to Pathology.  Gelfoam slurry needle track embolization.  Complications: None immediate  Estimated Blood Loss: < 5 mL  Recommendations: Strict bedrest 3 hrs. Follow up Pathology results.   Marliss Coots, MD Pager: [phone]

## 2020-09-18 NOTE — Discharge Instructions (Signed)
Per [place] DMV statutes, patients with seizures are not allowed to drive until they have been seizure-free for six months.    Use caution when using heavy equipment or power tools. Avoid working on ladders or at heights. Take showers instead of baths. Ensure the water temperature is not too high on the home water heater. Do not go swimming alone. Do not lock yourself in a room alone (i.e. bathroom). When caring for infants or small children, sit down when holding, feeding, or changing them to minimize risk of injury to the child in the event you have a seizure. Maintain good sleep hygiene. Avoid alcohol.    If patient has another seizure, call 911 and bring them back to the ED if: A.  The seizure lasts longer than 5 minutes.      B.  The patient doesn't wake shortly after the seizure or has new problems such as difficulty seeing, speaking or moving following the seizure C.  The patient was injured during the seizure D.  The patient has a temperature over 102 F (39C) E.  The patient vomited during the seizure and now is having trouble breathing  

## 2020-09-18 NOTE — Progress Notes (Signed)
IP PROGRESS NOTE  Subjective:   He continues to have a headache, but this has improved.  Left leg weakness has improved.  Mild diplopia.  He is ambulating.  He wants to go home.  No further nausea or vomiting.  Objective: Vital signs in last 24 hours: Blood pressure 133/88, pulse 67, temperature 98.4 F (36.9 C), temperature source Oral, resp. rate 13, height 5' 11.5" (1.816 m), weight 218 lb (98.9 kg), SpO2 96 %.  Intake/Output from previous day: 01/02 0701 - 01/03 0700 In: 240 [P.O.:240] Out: 1450 [Urine:1450]  Physical Exam:  HEENT: No thrush Abdomen: No hepatomegaly, nontender Extremities: No leg edema Neurologic: Alert and oriented.  The motor exam appears intact in the upper and lower extremities bilaterally with 4/5 strength with dorsiflexion at the left foot.  The extraocular movements are intact    Lab Results: Recent Labs    09/16/20 0307  WBC 14.3*  HGB 16.0  HCT 46.4  PLT 296    BMET Recent Labs    09/16/20 0307  NA 136  K 3.7  CL 100  CO2 25  GLUCOSE 128*  BUN 17  CREATININE 0.88  CALCIUM 8.9    Lab Results  Component Value Date   CEA1 0.6 09/15/2020    Studies/Results: No results found.  Medications: I have reviewed the patient's current medications.  Assessment/Plan: 1.  Multiple brain metastases, unknown primary tumor site  CT brain 09/14/2020-multiple hemorrhagic metastases with surrounding edema in the bilateral cerebral hemispheres  CTs chest, abdomen, pelvis 09/14/2020-1.4 cm right upper lobe subpleural nodule, 0.5 cm right lower lobe nodule, multiple hypodensities in the liver suspicious for metastases, 1.5 cm right upper pole hypodense kidney lesion-indeterminate  MRI abdomen 09/15/2020-multifocal T1 hyperintense liver lesions, no kidney mass 2.  Left-sided weakness, new onset seizure secondary to #1 3.  Hyperlipidemia 4.  Elevated liver enzymes and bilirubin 5.  Headache and nausea/vomiting secondary to #1 6. Small flat  brown mole at the plantar surface of the right foot   Mr Hirota has experienced improvement in nausea/vomiting, headache, neurologic symptoms while on Decadron.  He is scheduled for an ultrasound-guided biopsy of a liver lesion today.  He understands the prognosis and treatment plan will be determined by the pathology result.  I discussed the case with Dr. Roselind Messier.  He will be scheduled for an appointment in radiation oncology.  Dr. Roselind Messier will consider whole brain versus SBRT depending on the tissue diagnosis and the number of lesions seen on a brain MRI.  The brain MRI can be completed as an outpatient.  Recommendations: 1.  Proceed with IR biopsy of a liver lesion today 2.  Continue Decadron 4 mg twice daily 3.  Continue Keppra 4.  Outpatient follow-up at the Cancer [facility] 09/21/2019   LOS: 3 days   Thornton Papas, MD   09/18/2020, 10:22 AM

## 2020-09-18 NOTE — Progress Notes (Addendum)
Nsg Discharge Note  TOC pharmacy delivered medications to room.  Admit Date:  09/14/2020 Discharge date: 09/18/2020   Monta Police to be D/C'd Home per MD order.  AVS completed.  Copy for chart, and copy for patient signed, and dated. Patient/caregiver able to verbalize understanding.  Discharge Medication: Allergies as of 09/18/2020   No Known Allergies     Medication List    TAKE these medications   acetaminophen 500 MG tablet Commonly known as: TYLENOL Take 500 mg by mouth every 6 (six) hours as needed for mild pain.   dexamethasone 4 MG tablet Commonly known as: Decadron Take 1 tablet (4 mg total) by mouth 2 (two) times daily.   HYDROcodone-acetaminophen 5-325 MG tablet Commonly known as: NORCO/VICODIN Take 1-2 tablets by mouth every 4 (four) hours as needed for moderate pain.   levETIRAcetam 500 MG tablet Commonly known as: KEPPRA Take 1 tablet (500 mg total) by mouth 2 (two) times daily.   multivitamin with minerals Tabs tablet Take 1 tablet by mouth daily. Start taking on: September 19, 2020   ondansetron 4 MG tablet Commonly known as: Zofran Take 1 tablet (4 mg total) by mouth daily as needed for nausea or vomiting.   pantoprazole 40 MG tablet Commonly known as: PROTONIX Take 1 tablet (40 mg total) by mouth daily at 12 noon.       Discharge Assessment: Vitals:   09/18/20 1018 09/18/20 1224  BP: 132/80 136/90  Pulse: 60 (!) 101  Resp: 12 16  Temp:  98.3 F (36.8 C)  SpO2: 93% 93%   Skin clean, dry and intact without evidence of skin break down, no evidence of skin tears noted. IV catheter discontinued intact. Site without signs and symptoms of complications - no redness or edema noted at insertion site, patient denies c/o pain - only slight tenderness at site.  Dressing with slight pressure applied.  D/c Instructions-Education: Discharge instructions given to patient/family with verbalized understanding. D/c education completed with patient/family  including follow up instructions, medication list, d/c activities limitations if indicated, with other d/c instructions as indicated by MD - patient able to verbalize understanding, all questions fully answered. Patient instructed to return to ED, call 911, or call MD for any changes in condition.  Patient escorted via WC, and D/C home via private auto.  Boykin Nearing, RN 09/18/2020 1:05 PM

## 2020-09-18 NOTE — Discharge Summary (Signed)
Physician Discharge Summary  Sean [name] [id number] DOB: [birth year] DOA: 09/14/2020  PCP: Deland Pretty, MD  Admit date: 09/14/2020 Discharge date: 09/18/2020  Admitted From: home Discharge disposition: home   Recommendations for Outpatient Follow-Up:   1. Oncology follow up Wednesday 2. Do not return to work until seen by PCP 3. Seizure precautions 4. New meds: decadron and keppra 5. Biopsy results pending 1/3 6. CMP at next office visit   Discharge Diagnosis:   Principal Problem:   Brain metastasis ([place]) Active Problems:   Elevated blood pressure reading   Metastatic cancer [facility])    Discharge Condition: Improved.  Diet recommendation:Regular.  Wound care: None.  Code status: Full.   History of Present Illness:   Sean [name] is a 55 y.o. male who denies any significant past medical history and does not take any medications regularly, now presenting to the emergency department with 1 to 2 weeks of general malaise, more recent left-sided weakness, nausea, vomiting, and a brief loss of consciousness with urinary incontinence reported by EMS. He thought he had a viral illness and had 2 negative COVID tests prior to coming in. Reports smoking 1.5 ppd for 20 years before he quit. Drinks ~5 "generous" glasses of wine every day and has history of mild withdrawal symptoms. Does not know of any family history of cancer.     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  Found to have multiple brain metastasis with surrounding edema and mild midline shift to the right -Appreciate neurology mentation, continue Decadron, Keppra, seizure precautions -Tumor markers including PSA, CEA, CA 19-9, AFP: unremarkable -MRI abdomen showed multifocal metastatic  disease in the liver with intrinsic T1 hyperintensity raising the question of metastatic melanoma -s/p biopsy on 1/3- path pending -oncology follow up arranged -seen by Dr. Sondra Come  Suspected seizure -Presented with LOC, urinary incontinence -Likely due to brain mets with surrounding edema and midline shift -Continue Keppra 500 mg twice daily, Decadron  Hyponatremia, hypokalemia -Resolved  Transaminitis -Possibly due to liver mets vs alcohol -follow outpatient  Alcohol dependence -encouraged cessation/cutting back    Medical Consultants:   Oncology Rad onc   Discharge Exam:   Vitals:   09/18/20 0955 09/18/20 1018  BP: 133/88 132/80  Pulse: 67 60  Resp: 13 12  Temp:    SpO2: 96% 93%   Vitals:   09/18/20 0945 09/18/20 0950 09/18/20 0955 09/18/20 1018  BP: 127/78 127/89 133/88 132/80  Pulse: 62 65 67 60  Resp: 12 14 13 12   Temp:      TempSrc:      SpO2: 94% 93% 96% 93%  Weight:      Height:        General exam: Appears calm and comfortable.   The results of significant diagnostics from this hospitalization (including imaging, microbiology, ancillary and laboratory) are listed below for reference.     Procedures and Diagnostic Studies:   CT ANGIO HEAD W OR WO CONTRAST  Result Date: 09/14/2020 CLINICAL DATA:  Neuro deficit, acute, stroke suspected. EXAM: CT ANGIOGRAPHY HEAD AND NECK TECHNIQUE: Multidetector CT imaging of the head and neck was performed using the standard protocol during bolus administration of intravenous contrast. Multiplanar CT image reconstructions and MIPs were obtained to evaluate the vascular anatomy. Carotid stenosis measurements (when applicable) are obtained utilizing NASCET criteria, using the  distal internal carotid diameter as the denominator. CONTRAST:  143mL OMNIPAQUE IOHEXOL 300 MG/ML  SOLN COMPARISON:  Head CT September 14, 2020. FINDINGS: CT HEAD FINDINGS Brain: Postcontrast images showed multiple hemorrhagic lesions (series  12), as seen on prior CT: - measuring 17 mm in the right frontal lobe with mild surrounding edema, image 28; - 6 mm in the left frontal lobe, minimal surrounding edema, image 25; - 5 mm in the right parietal lobe, no significant edema, image 23; - 15 mm the left parietal lobe, mild surrounding vasogenic edema, image 23; - 31 mm in the left frontal lobe with surrounding vasogenic edema and effacement of the adjacent cerebral sulci, image 20; - 27 mm in the left occipital lobe, decompressing into the occipital horn of the left lateral ventricle with surrounding vasogenic edema effacing the adjacent cerebral sulci, image 17. In addition to the previously seen hemorrhagic lesions, enhancing 2 small enhancing lesions are seen in the left frontal lobe measuring 2-3 mm each (image 26) and a 6 mm enhancing lesion is seen in the right occipital lobe (image 13). No hydrocephalus. Evaluation for subarachnoid hemorrhage limited due to intravenous contrast. Vascular: See below. Skull: Normal. Negative for fracture or focal lesion. Sinuses: Imaged portions are clear. Orbits: No acute finding. Review of the MIP images confirms the above findings CTA NECK FINDINGS Aortic arch: Common origin of the innominate and left common carotid arteries from the aortic arch. The left vertebral artery origin is directly from the aortic arch. Imaged portion shows no evidence of aneurysm or dissection. No significant stenosis of the major arch vessel origins. Right carotid system: No evidence of dissection, stenosis (50% or greater) or occlusion. Left carotid system: No evidence of dissection, stenosis (50% or greater) or occlusion. Vertebral arteries: The left vertebral artery origin is directly from the aortic arch and is dominant. No evidence of dissection, stenosis (50% or greater) or occlusion. Skeleton: No aggressive lesion identified. Other neck: Negative. Upper chest: Negative. Review of the MIP images confirms the above findings CTA HEAD  FINDINGS Anterior circulation: No significant stenosis, proximal occlusion, aneurysm, or vascular malformation. Posterior circulation: No significant stenosis, proximal occlusion, aneurysm, or vascular malformation. Venous sinuses: As permitted by contrast timing, patent. Review of the MIP images confirms the above findings IMPRESSION: 1. No large vessel occlusion, hemodynamically significant stenosis, or evidence of dissection. 2. Multiple hemorrhagic lesions are seen in the bilateral cerebral hemispheres as described above. Electronically Signed   By: Pedro Earls M.D.   On: 09/14/2020 22:24   CT ANGIO NECK W OR WO CONTRAST  Result Date: 09/14/2020 CLINICAL DATA:  Neuro deficit, acute, stroke suspected. EXAM: CT ANGIOGRAPHY HEAD AND NECK TECHNIQUE: Multidetector CT imaging of the head and neck was performed using the standard protocol during bolus administration of intravenous contrast. Multiplanar CT image reconstructions and MIPs were obtained to evaluate the vascular anatomy. Carotid stenosis measurements (when applicable) are obtained utilizing NASCET criteria, using the distal internal carotid diameter as the denominator. CONTRAST:  142mL OMNIPAQUE IOHEXOL 300 MG/ML  SOLN COMPARISON:  Head CT September 14, 2020. FINDINGS: CT HEAD FINDINGS Brain: Postcontrast images showed multiple hemorrhagic lesions (series 12), as seen on prior CT: - measuring 17 mm in the right frontal lobe with mild surrounding edema, image 28; - 6 mm in the left frontal lobe, minimal surrounding edema, image 25; - 5 mm in the right parietal lobe, no significant edema, image 23; - 15 mm the left parietal lobe, mild surrounding vasogenic edema, image  23; - 31 mm in the left frontal lobe with surrounding vasogenic edema and effacement of the adjacent cerebral sulci, image 20; - 27 mm in the left occipital lobe, decompressing into the occipital horn of the left lateral ventricle with surrounding vasogenic edema effacing  the adjacent cerebral sulci, image 17. In addition to the previously seen hemorrhagic lesions, enhancing 2 small enhancing lesions are seen in the left frontal lobe measuring 2-3 mm each (image 26) and a 6 mm enhancing lesion is seen in the right occipital lobe (image 13). No hydrocephalus. Evaluation for subarachnoid hemorrhage limited due to intravenous contrast. Vascular: See below. Skull: Normal. Negative for fracture or focal lesion. Sinuses: Imaged portions are clear. Orbits: No acute finding. Review of the MIP images confirms the above findings CTA NECK FINDINGS Aortic arch: Common origin of the innominate and left common carotid arteries from the aortic arch. The left vertebral artery origin is directly from the aortic arch. Imaged portion shows no evidence of aneurysm or dissection. No significant stenosis of the major arch vessel origins. Right carotid system: No evidence of dissection, stenosis (50% or greater) or occlusion. Left carotid system: No evidence of dissection, stenosis (50% or greater) or occlusion. Vertebral arteries: The left vertebral artery origin is directly from the aortic arch and is dominant. No evidence of dissection, stenosis (50% or greater) or occlusion. Skeleton: No aggressive lesion identified. Other neck: Negative. Upper chest: Negative. Review of the MIP images confirms the above findings CTA HEAD FINDINGS Anterior circulation: No significant stenosis, proximal occlusion, aneurysm, or vascular malformation. Posterior circulation: No significant stenosis, proximal occlusion, aneurysm, or vascular malformation. Venous sinuses: As permitted by contrast timing, patent. Review of the MIP images confirms the above findings IMPRESSION: 1. No large vessel occlusion, hemodynamically significant stenosis, or evidence of dissection. 2. Multiple hemorrhagic lesions are seen in the bilateral cerebral hemispheres as described above. Electronically Signed   By: Pedro Earls  M.D.   On: 09/14/2020 22:24   MR ABDOMEN W WO CONTRAST  Result Date: 09/15/2020 CLINICAL DATA:  Metastatic disease, indeterminate RIGHT renal lesion in this 55 year old male found to have intracranial abnormalities and abnormalities on recent CT of the chest, abdomen and pelvis in the abdomen EXAM: MRI ABDOMEN WITHOUT AND WITH CONTRAST TECHNIQUE: Multiplanar multisequence MR imaging of the abdomen was performed both before and after the administration of intravenous contrast. CONTRAST:  76mL GADAVIST GADOBUTROL 1 MMOL/ML IV SOLN COMPARISON:  CT chest, abdomen and pelvis of September 14, 2020 FINDINGS: Lower chest: Incidental imaging of the lung bases on MRI with limited assessment is unremarkable. No effusion. No consolidative changes. Hepatobiliary: Numerous areas of diffusion related abnormalities in the hepatic parenchyma, for instance on image 97 of series 6 there are 4 distinct lesions 1 in the posterior RIGHT hepatic lobe likely corresponding to 1 of the areas seen on CT and 2 in the medial RIGHT hepatic lobe near the IVC confluence. Assessment of diffusion weighted images shows greater than 20 scattered foci of subtle restricted diffusion throughout the liver, subtle based on size There is a tiny focus of restricted diffusion along the margin of the posterior RIGHT hemi liver which may be extrahepatic (image 111, series 8) this measures approximately 5 mm. For reference the largest area of signal abnormality in the liver measures 11 mm on image 96 of series 8. The rest of these abnormalities are less than a cm and most within the 3-4 mm size range. The largest area of diffusion related signal abnormality  shows peripheral enhancement and signs of washout measuring 16 mm (image 36, series 18) greatest axial dimension on the contrasted images. Other tiny foci are scattered throughout the liver showing enhancement though not as well seen is on diffusion related images. Scattered tiny foci show intrinsic T1  hyperintensity. No signs of biliary duct dilation or pericholecystic stranding. Pancreas: Normal intrinsic T1 signal. No ductal dilation or sign of inflammation. Spleen:  Normal size spleen without focal lesion. Adrenals/Urinary Tract:  Adrenal glands are normal. Symmetric renal enhancement. No hydronephrosis. No suspicious renal lesion. The area of concern in the RIGHT upper pole discussed on the previous CT evaluation is not visible on MRI showing subtle wedge-shaped characteristics but slightly more rounded than expected for infarct or pyelonephritis though potentially related focal pyelonephritis given that it is not visible on the current exam. Subtle stranding is noted in this location adjacent to the kidney. Stomach/Bowel: No acute gastrointestinal process to the extent evaluated. Note that there is limited assessment of the gastrointestinal tract on MRI not performed for bowel evaluation. Vascular/Lymphatic: Patent abdominal vasculature. There is no gastrohepatic or hepatoduodenal ligament lymphadenopathy. No retroperitoneal or mesenteric lymphadenopathy. Other: Trace subtle indistinct appearance of the RIGHT posterior hepatic lobe. Area of restricted diffusion along the inferior RIGHT hepatic margin shows intrinsic T1 hyperintensity. Musculoskeletal: No suspicious bone lesions identified. IMPRESSION: Multifocal metastatic disease in the liver with intrinsic T1 hyperintensity, raising the question of metastatic melanoma based on signal characteristics, small size and multiplicity. Other differential considerations would include carcinoid tumor given the appearance on diffusion-weighted imaging. Area of concern in the RIGHT kidney is not visualized on the current exam. Suggest close attention on follow-up to this location. Perhaps this represented a small area of focal nephritis. Could be that this is isointense to parenchyma on baseline images to slightly hyperintense and does not separated cell from the  surrounding cortex well on post-contrast images though it cannot be seen on other sequences either on today's study. Hepatic steatosis. Electronically Signed   By: Donzetta Kohut M.D.   On: 09/15/2020 10:01   CT CHEST ABDOMEN PELVIS W CONTRAST  Result Date: 09/14/2020 CLINICAL DATA:  Intracranial metastases, no known primary malignancy EXAM: CT CHEST, ABDOMEN, AND PELVIS WITH CONTRAST TECHNIQUE: Multidetector CT imaging of the chest, abdomen and pelvis was performed following the standard protocol during bolus administration of intravenous contrast. CONTRAST:  OMNIPAQUE IOHEXOL 300 MG/ML  SOLN COMPARISON:  03/13/2015 FINDINGS: CT CHEST FINDINGS Cardiovascular: The heart and great vessels are unremarkable without pericardial effusion. No evidence of aortic aneurysm or dissection. Mediastinum/Nodes: No enlarged mediastinal, hilar, or axillary lymph nodes. Thyroid gland, trachea, and esophagus demonstrate no significant findings. Lungs/Pleura: Pulmonary nodules are as follows: 1.4 x 1.0 cm right upper lobe subpleural nodule image 51/6. 0.5 cm right lower lobe pulmonary nodule image 82/6. No airspace disease, effusion, or pneumothorax. The central airways are patent. Musculoskeletal: No acute or destructive bony lesions. Reconstructed images demonstrate no additional findings. CT ABDOMEN PELVIS FINDINGS Hepatobiliary: Multiple small hypodensities are seen within the liver parenchyma, too small to characterize but suspicious for metastatic disease given the intracranial findings. Hypodensities are seen within the right lobe liver image 46 and 49, measuring up to 11 mm in size. Hypodensity within the inferior margin of the right lobe liver image 76 measures 13 mm. No intrahepatic duct dilation. The gallbladder is unremarkable. Pancreas: Unremarkable. No pancreatic ductal dilatation or surrounding inflammatory changes. Spleen: Normal in size without focal abnormality. Adrenals/Urinary Tract: 1.6 cm hypodensity  upper pole  right kidney measures 23 Hounsfield units image 66/5, indeterminate. This is new since prior CT. The remainder of the kidneys enhance normally. No urinary tract calculi or obstruction. The adrenals and bladder are unremarkable. Stomach/Bowel: No bowel obstruction or ileus. Normal appendix right lower quadrant. No bowel wall thickening or inflammatory change. Vascular/Lymphatic: No significant vascular findings are present. No enlarged abdominal or pelvic lymph nodes. Reproductive: Prostate is unremarkable. Other: No free fluid or free gas. No abdominal wall hernia. Musculoskeletal: No acute or destructive bony lesions. Reconstructed images demonstrate no additional findings. IMPRESSION: 1. A 1.4 x 1.0 cm right upper lobe subpleural nodule, which could reflect primary or metastatic disease. PET-CT may be useful for follow-up. The 0.5 cm right lower lobe nodule be too small to characterize by PET scan, and will require serial follow-up. 2. Multiple small hypodensities within the liver parenchyma, suspicious for liver metastases. 3. A 1.6 cm hypodensity upper pole right kidney, indeterminate. This could be further evaluated with MRI without and with contrast. Electronically Signed   By: Randa Ngo M.D.   On: 09/14/2020 22:15   EEG adult  Result Date: 09/15/2020 Lora Havens, MD     09/15/2020  3:13 PM Patient Name: Christopherjame Dejoie MRN: [id number] Epilepsy Attending: Lora Havens Referring Physician/Provider: Dr Gean Birchwood Date: 09/15/2020 Duration: 23.25 mins Patient history: 55yo M with seizure like activity. EEG to evaluate for seizure Level of alertness: Awake, asleep AEDs during EEG study: LEV Technical aspects: This EEG study was done with scalp electrodes positioned according to the 10-20 International system of electrode placement. Electrical activity was acquired at a sampling rate of 500Hz  and reviewed with a high frequency filter of 70Hz  and a low frequency filter of 1Hz . EEG  data were recorded continuously and digitally stored. Description: The posterior dominant rhythm consists of 9 Hz activity of moderate voltage (25-35 uV) seen predominantly in posterior head regions, symmetric and reactive to eye opening and eye closing. Sleep was characterized by vertex waves, sleep spindles (12 to 14 Hz), maximal frontocentral region. Hyperventilation and photic stimulation were not performed.   IMPRESSION: This study is within normal limits. No seizures or epileptiform discharges were seen throughout the recording. Lora Havens   CT HEAD CODE STROKE WO CONTRAST  Result Date: 09/14/2020 CLINICAL DATA:  Code stroke.  Acute neuro deficit.  Vomiting. EXAM: CT HEAD WITHOUT CONTRAST TECHNIQUE: Contiguous axial images were obtained from the base of the skull through the vertex without intravenous contrast. COMPARISON:  None. FINDINGS: Brain: Multiple areas of hemorrhage in the brain. Many these are associated with mass lesions and edema. Appearance is most consistent with hemorrhagic metastasis. Largest lesion in the left frontal lobe measures 2.8 cm in diameter with mild hemorrhage and mild to moderate edema. Left occipital hemorrhage with surrounding edema measures 2.5 cm. Multiple additional smaller areas of hemorrhage are present in the frontal and parietal lobes bilaterally and in the right occipital lobe. Ventricle size normal. Mild midline shift to the right of approximately 3 mm. Vascular: Negative for hyperdense vessel Skull: No skeletal lesion identified in the calvarium Sinuses/Orbits: Paranasal sinuses clear.  Negative orbit. Other: None IMPRESSION: 1. 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 Mild midline shift to the right. 2. These results were called by telephone at the time of interpretation on 09/14/2020 at 9:44 pm to provider Rory Percy , who verbally acknowledged these results. Electronically Signed   By: Franchot Gallo M.D.   On: 09/14/2020  21:45  Labs:   Basic Metabolic Panel: Recent Labs  Lab 09/14/20 1042 09/14/20 2144 09/14/20 2148 09/15/20 0430 09/16/20 0307  NA 134* 131* 134* 138 136  K 4.3 3.4* 3.5 3.7 3.7  CL 100 97* 97* 101 100  CO2 20* 20*  --  23 25  GLUCOSE 136* 140* 139* 135* 128*  BUN 19 16 17 13 17   CREATININE 0.85 0.83 0.60* 0.91 0.88  CALCIUM 9.2 9.1  --  9.4 8.9  MG  --   --   --   --  2.2  PHOS  --   --   --   --  3.5   GFR Estimated Creatinine Clearance: 113.4 mL/min (by C-G formula based on SCr of 0.88 mg/dL). Liver Function Tests: Recent Labs  Lab 09/14/20 2144 09/15/20 0430 09/16/20 0307  AST 64* 82* 52*  ALT 102* 141* 139*  ALKPHOS 108 113 101  BILITOT 1.4* 1.6* 1.1  PROT 7.5 7.5 6.8  ALBUMIN 4.1 4.0 3.7   No results for input(s): LIPASE, AMYLASE in the last 168 hours. No results for input(s): AMMONIA in the last 168 hours. Coagulation profile Recent Labs  Lab 09/14/20 2144  INR 1.1    CBC: Recent Labs  Lab 09/14/20 1042 09/14/20 2144 09/14/20 2148 09/15/20 0326 09/16/20 0307  WBC 15.1* 18.6*  --  16.5* 14.3*  NEUTROABS  --  15.8*  --   --   --   HGB 16.6 16.8 16.7 17.2* 16.0  HCT 49.2 46.1 49.0 49.7 46.4  MCV 91.6 89.2  --  90.9 90.1  PLT 304 325  --  284 296   Cardiac Enzymes: No results for input(s): CKTOTAL, CKMB, CKMBINDEX, TROPONINI in the last 168 hours. BNP: Invalid input(s): POCBNP CBG: Recent Labs  Lab 09/14/20 2122  GLUCAP 134*   D-Dimer No results for input(s): DDIMER in the last 72 hours. Hgb A1c No results for input(s): HGBA1C in the last 72 hours. Lipid Profile No results for input(s): CHOL, HDL, LDLCALC, TRIG, CHOLHDL, LDLDIRECT in the last 72 hours. Thyroid function studies No results for input(s): TSH, T4TOTAL, T3FREE, THYROIDAB in the last 72 hours.  Invalid input(s): FREET3 Anemia work up No results for input(s): VITAMINB12, FOLATE, FERRITIN, TIBC, IRON, RETICCTPCT in the last 72 hours. Microbiology Recent Results (from  the past 240 hour(s))  Resp Panel by RT-PCR (Flu A&B, Covid) Nasopharyngeal Swab     Status: None   Collection Time: 09/14/20  9:23 PM   Specimen: Nasopharyngeal Swab; Nasopharyngeal(NP) swabs in vial transport medium  Result Value Ref Range Status   SARS Coronavirus 2 by RT PCR NEGATIVE NEGATIVE Final    Comment: (NOTE) SARS-CoV-2 target nucleic acids are NOT DETECTED.  The SARS-CoV-2 RNA is generally detectable in upper respiratory specimens during the acute phase of infection. The lowest concentration of SARS-CoV-2 viral copies this assay can detect is 138 copies/mL. A negative result does not preclude SARS-Cov-2 infection and should not be used as the sole basis for treatment or other patient management decisions. A negative result may occur with  improper specimen collection/handling, submission of specimen other than nasopharyngeal swab, presence of viral mutation(s) within the areas targeted by this assay, and inadequate number of viral copies(<138 copies/mL). A negative result must be combined with clinical observations, patient history, and epidemiological information. The expected result is Negative.  Fact Sheet for Patients:  EntrepreneurPulse.com.au  Fact Sheet for Healthcare Providers:  IncredibleEmployment.be  This test is no t yet approved or cleared by the [place]  FDA and  has been authorized for detection and/or diagnosis of SARS-CoV-2 by FDA under an Emergency Use Authorization (EUA). This EUA will remain  in effect (meaning this test can be used) for the duration of the COVID-19 declaration under Section 564(b)(1) of the Act, 21 U.S.C.section 360bbb-3(b)(1), unless the authorization is terminated  or revoked sooner.       Influenza A by PCR NEGATIVE NEGATIVE Final   Influenza B by PCR NEGATIVE NEGATIVE Final    Comment: (NOTE) The Xpert Xpress SARS-CoV-2/FLU/RSV plus assay is intended as an aid in the diagnosis of  influenza from Nasopharyngeal swab specimens and should not be used as a sole basis for treatment. Nasal washings and aspirates are unacceptable for Xpert Xpress SARS-CoV-2/FLU/RSV testing.  Fact Sheet for Patients: BloggerCourse.com  Fact Sheet for Healthcare Providers: SeriousBroker.it  This test is not yet approved or cleared by the [place] FDA and has been authorized for detection and/or diagnosis of SARS-CoV-2 by FDA under an Emergency Use Authorization (EUA). This EUA will remain in effect (meaning this test can be used) for the duration of the COVID-19 declaration under Section 564(b)(1) of the Act, 21 U.S.C. section 360bbb-3(b)(1), unless the authorization is terminated or revoked.  Performed at [facility], [address] [place], [place] [address]      Discharge Instructions:   Discharge Instructions    Diet general   Complete by: As directed    Discharge instructions   Complete by: As directed    Per [facility] statutes, patients with seizures are not allowed to drive until they have been seizure-free for six months.   Use caution when using heavy equipment or power tools. Avoid working on ladders or at heights. Take showers instead of baths. Ensure the water temperature is not too high on the home water heater. Do not go swimming alone. Do not lock yourself in a room alone (i.e. bathroom). When caring for infants or small children, sit down when holding, feeding, or changing them to minimize risk of injury to the child in the event you have a seizure. Maintain good sleep hygiene. Avoid alcohol.   If patienthas another seizure, call 911 and bring them back to the ED if: A. The seizure lasts longer than 5 minutes.  B. The patient doesn't wake shortly after the seizure or has new problems such as difficulty seeing, speaking or moving following the seizure C. The patient was injured during  the seizure D. The patient has a temperature over 102 F (39C) E. The patient vomited during the seizure and now is having trouble breathing   Increase activity slowly   Complete by: As directed    No wound care   Complete by: As directed      Allergies as of 09/18/2020   No Known Allergies     Medication List    TAKE these medications   acetaminophen 500 MG tablet Commonly known as: TYLENOL Take 500 mg by mouth every 6 (six) hours as needed for mild pain.   dexamethasone 4 MG tablet Commonly known as: Decadron Take 1 tablet (4 mg total) by mouth 2 (two) times daily.   HYDROcodone-acetaminophen 5-325 MG tablet Commonly known as: NORCO/VICODIN Take 1-2 tablets by mouth every 4 (four) hours as needed for moderate pain.   levETIRAcetam 500 MG tablet Commonly known as: KEPPRA Take 1 tablet (500 mg total) by mouth 2 (two) times daily.   multivitamin with minerals Tabs tablet Take 1 tablet by mouth  daily. Start taking on: September 19, 2020   ondansetron 4 MG tablet Commonly known as: Zofran Take 1 tablet (4 mg total) by mouth daily as needed for nausea or vomiting.   pantoprazole 40 MG tablet Commonly known as: PROTONIX Take 1 tablet (40 mg total) by mouth daily at 12 noon.       Follow-up Information    Deland Pretty, MD Follow up in 1 week(s).   Specialty: Internal Medicine Contact information: [address] [place] [address] [phone]        [name], MD Follow up.   Specialty: Oncology Why: 09/21/2019 Contact information: [place] [address] [phone]                Time coordinating discharge: 35 min  Signed:  Geradine Girt DO  [facility] 09/18/2020, 12:24 PM

## 2020-09-19 ENCOUNTER — Ambulatory Visit: Payer: 59

## 2020-09-19 ENCOUNTER — Institutional Professional Consult (permissible substitution): Payer: 59 | Admitting: Radiation Oncology

## 2020-09-19 ENCOUNTER — Telehealth: Payer: Self-pay | Admitting: Radiation Therapy

## 2020-09-19 NOTE — Telephone Encounter (Signed)
Called to introduce myself to the patient and offer my contact info as a resource. He is aware of the appointment with Dr. Basilio Cairo on Friday 1/7, and that we are still awaiting his path results to determine the best treatment course. (SRS alone or SRS followed by surgical resection of the two largest lesions)  I explained that we will be setting up an appointment for him to meet with Dr. Maurice Small, neurosurgeon, once his path and treatment technique has been determined. Sean [name] is happy with this plan and thankful for the call.   Jalene Mullet R.T.(R)(T) Radiation Special Procedures Navigator

## 2020-09-20 ENCOUNTER — Inpatient Hospital Stay: Payer: 59 | Attending: Oncology | Admitting: Oncology

## 2020-09-20 ENCOUNTER — Other Ambulatory Visit: Payer: Self-pay | Admitting: Radiation Therapy

## 2020-09-20 ENCOUNTER — Telehealth: Payer: Self-pay | Admitting: *Deleted

## 2020-09-20 ENCOUNTER — Other Ambulatory Visit: Payer: Self-pay | Admitting: Oncology

## 2020-09-20 ENCOUNTER — Other Ambulatory Visit: Payer: Self-pay

## 2020-09-20 VITALS — BP 154/94 | HR 65 | Temp 97.9°F | Resp 17 | Ht 71.5 in | Wt 216.2 lb

## 2020-09-20 DIAGNOSIS — H539 Unspecified visual disturbance: Secondary | ICD-10-CM | POA: Diagnosis not present

## 2020-09-20 DIAGNOSIS — Z5112 Encounter for antineoplastic immunotherapy: Secondary | ICD-10-CM | POA: Diagnosis not present

## 2020-09-20 DIAGNOSIS — R748 Abnormal levels of other serum enzymes: Secondary | ICD-10-CM | POA: Diagnosis not present

## 2020-09-20 DIAGNOSIS — R569 Unspecified convulsions: Secondary | ICD-10-CM | POA: Diagnosis not present

## 2020-09-20 DIAGNOSIS — C7931 Secondary malignant neoplasm of brain: Secondary | ICD-10-CM | POA: Diagnosis not present

## 2020-09-20 DIAGNOSIS — C439 Malignant melanoma of skin, unspecified: Secondary | ICD-10-CM | POA: Diagnosis present

## 2020-09-20 DIAGNOSIS — Z79899 Other long term (current) drug therapy: Secondary | ICD-10-CM | POA: Insufficient documentation

## 2020-09-20 LAB — SURGICAL PATHOLOGY

## 2020-09-20 MED ORDER — HYDROCODONE-ACETAMINOPHEN 5-325 MG PO TABS
1.0000 | ORAL_TABLET | Freq: Four times a day (QID) | ORAL | 0 refills | Status: DC | PRN
Start: 2020-09-20 — End: 2020-10-16

## 2020-09-20 NOTE — Progress Notes (Signed)
[facility] OFFICE PROGRESS NOTE   Diagnosis: Multiple brain lesions  INTERVAL HISTORY:   Mr. Schifano underwent an ultrasound-guided biopsy of a liver lesion on 09/18/2020.  He reports tolerating the procedure well.  He was discharged from the hospital on 09/18/2020.  No seizures.  No further nausea and vomiting.  The headache has improved.  He takes hydrocodone as needed.  Left leg weakness has improved.  No new complaint.  Objective:  Vital signs in last 24 hours:  Blood pressure (!) 154/94, pulse 65, temperature 97.9 F (36.6 C), temperature source Tympanic, resp. rate 17, height 5' 11.5" (1.816 m), weight 216 lb 3.2 oz (98.1 kg), SpO2 98 %.    HEENT: No thrush Resp: Lungs clear bilaterally Cardio: Regular rate and rhythm GI: No hepatosplenomegaly, nontender, right upper quadrant biopsy site without evidence of infection Vascular: No leg edema Neuro: Alert and oriented, the extraocular movements appear intact, the motor exam appears intact except for 4/5 strength with dorsi flexion at the left foot Skin: Tiny hyperpigmented lesions at the dorsum of the right foot   Lab Results:  Lab Results  Component Value Date   WBC 14.3 (H) 09/16/2020   HGB 16.0 09/16/2020   HCT 46.4 09/16/2020   MCV 90.1 09/16/2020   PLT 296 09/16/2020   NEUTROABS 15.8 (H) 09/14/2020    CMP  Lab Results  Component Value Date   NA 136 09/16/2020   K 3.7 09/16/2020   CL 100 09/16/2020   CO2 25 09/16/2020   GLUCOSE 128 (H) 09/16/2020   BUN 17 09/16/2020   CREATININE 0.88 09/16/2020   CALCIUM 8.9 09/16/2020   PROT 6.8 09/16/2020   ALBUMIN 3.7 09/16/2020   AST 52 (H) 09/16/2020   ALT 139 (H) 09/16/2020   ALKPHOS 101 09/16/2020   BILITOT 1.1 09/16/2020   GFRNONAA >60 09/16/2020    Lab Results  Component Value Date   CEA1 0.6 09/15/2020    Lab Results  Component Value Date   INR 1.1 09/14/2020    Imaging:  MR BRAIN W WO CONTRAST  Result Date: 09/18/2020 CLINICAL  DATA:  Brain lesions. EXAM: MRI HEAD WITHOUT AND WITH CONTRAST TECHNIQUE: Multiplanar, multiecho pulse sequences of the brain and surrounding structures were obtained without and with intravenous contrast. CONTRAST:  51mL GADAVIST GADOBUTROL 1 MMOL/ML IV SOLN COMPARISON:  CT head 09/14/2020 FINDINGS: Brain: Multiple hemorrhagic mass lesions in the brain with surrounding edema. These all show T1 shortening due to methemoglobin or melanin which could be obscuring enhancement. 2.5 cm left frontal lesion with surrounding edema. This does show a small satellite enhancing nodule laterally measuring about 6 mm. 37 x 19 mm mass in the left occipital lobe with hemorrhage. This lesion does show scattered areas of enhancement particularly extending towards the left occipital horn. There is surrounding edema. Multiple additional hemorrhagic lesions are seen in the brain 5 mm hemorrhagic lesion right occipital lobe axial image 151 3 mm lesion right superior cerebellum axial image 138 12 mm lesion left parietal lobe image 194 6 mm hemorrhagic lesion left frontal lobe axial image 201 5 mm hemorrhagic lesion right medial parietal lobe axial image 203 3 mm lesion left posterior parietal lobe. 4 mm lesion left parietal lobe. 3 mm lesion left lateral parietal lobe axial image 197 Ventricle size normal. No midline shift. Negative for acute infarct. Vascular: Normal arterial flow voids. Skull and upper cervical spine: No focal skeletal abnormality. Sinuses/Orbits: Paranasal sinuses clear.  Negative orbit Other: None IMPRESSION: Multiple hemorrhagic  lesions in the brain with surrounding edema most compatible with metastatic disease. All lesions contain T1 hyperintensity compatible with methemoglobin which could obscure enhancement which is difficult to see on most lesions. However there is enhancement of a small satellite lesion in the left frontal lobe and also enhancement of a hemorrhagic lesion in the left parietal lobe extending  towards the occipital horn. Findings compatible with metastatic disease. Possible melanoma. Electronically Signed   By: Franchot Gallo M.D.   On: 09/18/2020 14:42   US BIOPSY (LIVER)  Result Date: 09/18/2020 INDICATION: 55 year old male with multiple metastatic lesions of unknown primary. EXAM: ULTRASOUND GUIDED LIVER LESION BIOPSY COMPARISON:  MRI abdomen from 09/15/2020 MEDICATIONS: None ANESTHESIA/SEDATION: Moderate (conscious) sedation was employed during this procedure. A total of Versed 1 mg and Fentanyl 50 mcg was administered intravenously. Moderate Sedation Time: 16 minutes. The patient's level of consciousness and vital signs were monitored continuously by radiology nursing throughout the procedure under my direct supervision. COMPLICATIONS: None immediate. PROCEDURE: Informed written consent was obtained from the patient after a discussion of the risks, benefits and alternatives to treatment. The patient understands and consents the procedure. A timeout was performed prior to the initiation of the procedure. Ultrasound scanning was performed of the right upper abdominal quadrant demonstrates a 0.9 cm hypoechoic mass near the portal triad. The most conspicuous lesion visualized on MRI in the hepatic dome was unable to be visualized sonographically. There is an additional, ill-defined hypoechoic mass just superior to the gallbladder in segment 4 B which measures approximately 1.9 cm in maximum dimension. The segment 4B lesion was selected for biopsy and the procedure was planned. The right upper abdominal quadrant was prepped and draped in the usual sterile fashion. The overlying soft tissues were anesthetized with 1% lidocaine with epinephrine. A 17 gauge, 6.8 cm co-axial needle was advanced into a peripheral aspect of the lesion. This was followed by a total of 3 core biopsies with an 18 gauge core device under direct ultrasound guidance. The coaxial needle tract was embolized with a small amount of  Gel-Foam slurry and superficial hemostasis was obtained with manual compression. Post procedural scanning was negative for definitive area of hemorrhage or additional complication. A dressing was placed. The patient tolerated the procedure well without immediate post procedural complication. IMPRESSION: Technically successful ultrasound guided core needle biopsy of hypoechoic mass in segment 4 B, presumed metastatic lesion. Ruthann Cancer, MD [facility] Electronically Signed   By: Ruthann Cancer MD   On: 09/18/2020 14:40    Medications: I have reviewed the patient's current medications.   Assessment/Plan: 1.Multiple brain metastases, unknown primary tumor site  CT brain 09/14/2020-multiple hemorrhagic metastases with surrounding edema in the bilateral cerebral hemispheres  CTs chest, abdomen, pelvis 09/14/2020-1.4 cm right upper lobe subpleural nodule, 0.5 cm right lower lobe nodule, multiple hypodensities in the liver suspicious for metastases, 1.5 cm right upper pole hypodense kidney lesion-indeterminate  MRI abdomen 09/15/2020-multifocal T1 hyperintense liver lesions, no kidney mass  Ultrasound-guided biopsy of a segment 4B liver lesion 09/18/2020-benign liver parenchyma with steatosis, no evidence of malignancy 2.Left-sided weakness, new onset seizure secondary to #1 3.Hyperlipidemia 4.Elevated liver enzymes and bilirubin 5.Headache and nausea/vomiting secondary to #1 6.Small flat brown mole at the plantar surface of the right foot     Disposition: Mr. Vaneck appears stable.  The liver biopsy on 09/18/2020 is nondiagnostic.  I discussed the differential diagnosis with Mr. Nixdorf and his wife.  The differential includes metastatic disease from  multiple potential primary tumor sites and less likely a nonmalignant process.  He does not have symptoms to suggest an infection.  We reviewed the MRI and CT images.  I  discussed the case with Dr. Isidore Moos.  I will ask interventional radiology to consider a biopsy of the right upper lobe subpleural nodule.  If this cannot be obtained then we will ask neurosurgery perform a diagnostic/therapeutic brain biopsy on a dominant brain lesion.  Mr. Scheirer will continue Decadron and Keppra.  I refilled his prescription for hydrocodone.  He will return for an office visit next week.  He will call in the interim for new symptoms.  Betsy Coder, MD  09/20/2020  1:54 PM

## 2020-09-20 NOTE — Telephone Encounter (Addendum)
Sean [name], Per Dr. Truett [name]: He reviewed his images with radiologist in interventional radiology. It is possible to biopsy a lung lesion, but it is in a difficult location. Radiologist recommends a PET scan to look for any other lesion that is easier to biopsy. Dr. Truett [name] will talk with Dr. Basilio [name] about this and may consult neurosurgeon to consider brain biopsy if necessary. Will keep you posted. Tried to reach you at 5:50 pm today. Sean Gosa,RN

## 2020-09-21 ENCOUNTER — Telehealth: Payer: Self-pay | Admitting: *Deleted

## 2020-09-21 ENCOUNTER — Other Ambulatory Visit: Payer: Self-pay | Admitting: Neurological Surgery

## 2020-09-21 ENCOUNTER — Telehealth: Payer: Self-pay | Admitting: Oncology

## 2020-09-21 ENCOUNTER — Telehealth: Payer: Self-pay

## 2020-09-21 NOTE — Progress Notes (Signed)
Location/Histology of Brain Tumor:  Multiple brain metastases, unknown primary tumor site  Brain MRI --09/18/2020 IMPRESSION: Multiple hemorrhagic lesions in the brain with surrounding edema most compatible with metastatic disease. All lesions contain T1 hyperintensity compatible with methemoglobin which could obscure enhancement which is difficult to see on most lesions. However there is enhancement of a small satellite lesion in the left frontal lobe and also enhancement of a hemorrhagic lesion in the left parietal lobe extending towards the occipital horn. Findings compatible with metastatic disease. Possible melanoma.  Patient presented with symptoms of: presenting to the emergency department with 1 to 2 weeks of general malaise,diplopia, more recent left-sided weakness, nausea, vomiting, and a brief loss of consciousness with urinary incontinence reported by EMS.He thought he had a viral illness and had 2 negative COVID tests prior to coming in to hospital  Past or anticipated interventions, if any, per neurosurgery:  Dr. Emelda Brothers  Pre-operative SRS followed by craniotomy to resect larger lesions on the LEFT side (left frontal and occipital).   Past or anticipated interventions, if any, per medical oncology:  Under care of Dr. Betsy Coder 09/20/2020 --Disposition:  [name] appears stable.  The liver biopsy on 09/18/2020 is nondiagnostic.  I discussed the differential diagnosis with Mr. [name] and his wife.  The differential includes metastatic disease from multiple potential primary tumor sites and less likely a nonmalignant process.  He does not have symptoms to suggest an infection.  We reviewed the MRI and CT images.  I discussed the case with Dr. Isidore Moos.  I will ask interventional radiology to consider a biopsy of the right upper lobe subpleural nodule.  If this cannot be obtained then we will ask neurosurgery perform a diagnostic/therapeutic brain biopsy on a  dominant brain lesion.  Mr. [name] will continue Decadron and Keppra.  I refilled his prescription for hydrocodone.  He will return for an office visit next week.  He will call in the interim for new symptoms.  Dose of Decadron, if applicable: 4 mg PO twice a day  Recent neurologic symptoms, if any:   Seizures: Not since initial hospitalization   Headaches: Occasionally. Before hospitalization he had intense frontal headaches that caused double vision. Symptoms have improved with steroid  Nausea: Patient denies  Dizziness/ataxia: Patient denies  Difficulty with hand coordination: Patient denies   Focal numbness/weakness: Reports strength and coordination are returing to his left leg/foot  Visual deficits/changes: Initially had double vision, but it has resolved since hospitalization  Confusion/Memory deficits: Patient denies  SAFETY ISSUES:  Prior radiation? No  Pacemaker/ICD? No  Possible current pregnancy? N/A  Is the patient on methotrexate? No  Additional Complaints / other details:  Scheduled for PET scan on 09/25/2020. Met with Dr. Emelda Brothers this morning.

## 2020-09-21 NOTE — Telephone Encounter (Signed)
Radiologist prefers to obtain PET scan to determine if better biopsy location is seen. Scheduled PET at [facility] for 09/25/20 at 12:00/12:30. Entrance at the [facility]". Nothing to eat or drink for 6 hours prior to scan. Eat low carb meal night before. 1st available at [facility] was 09/28/20. Unable to reach patient or his wife with appointment. Sent MyChart message and note to Dr. Colletta Maryland nurse to inform patient tomorrow during appointment.

## 2020-09-21 NOTE — Telephone Encounter (Signed)
Called and let message for both Husband and wife to clarify Norco order no answer asked that they call back  Currently awaiting call back

## 2020-09-21 NOTE — Telephone Encounter (Signed)
Scheduled appointment per 1/5 los. Called patient, no answer. Left message with appointment date and time.

## 2020-09-22 ENCOUNTER — Other Ambulatory Visit: Payer: Self-pay

## 2020-09-22 ENCOUNTER — Ambulatory Visit
Admission: RE | Admit: 2020-09-22 | Discharge: 2020-09-22 | Disposition: A | Discharge status: Home / Self Care | Payer: 59 | Source: Ambulatory Visit | Attending: Radiation Oncology | Admitting: Radiation Oncology

## 2020-09-22 VITALS — BP 150/91 | HR 88 | Temp 97.9°F | Resp 20 | Wt 214.4 lb

## 2020-09-22 DIAGNOSIS — C7931 Secondary malignant neoplasm of brain: Secondary | ICD-10-CM

## 2020-09-22 DIAGNOSIS — Z510 Encounter for antineoplastic radiation therapy: Secondary | ICD-10-CM | POA: Diagnosis not present

## 2020-09-22 DIAGNOSIS — C801 Malignant (primary) neoplasm, unspecified: Secondary | ICD-10-CM | POA: Insufficient documentation

## 2020-09-22 DIAGNOSIS — C439 Malignant melanoma of skin, unspecified: Secondary | ICD-10-CM | POA: Insufficient documentation

## 2020-09-22 MED ORDER — SODIUM CHLORIDE 0.9% FLUSH: 10.0000 mL | Freq: Once | INTRAVENOUS | Status: DC

## 2020-09-22 NOTE — Progress Notes (Signed)
Has armband been applied?  Yes.    Does patient have an allergy to IV contrast dye?: No.   Has patient ever received premedication for IV contrast dye?: No.   Does patient take metformin?: No.  Date of lab work: September 16, 2020 BUN: 17 CR: 0.88  IV site: forearm right, condition patent and no redness  Has IV site been added to flowsheet?  Yes.    12:07 BP: 150/91 HR: 88 RR: 20 T: 97.9 (temporal) O2: 100%

## 2020-09-22 NOTE — Progress Notes (Signed)
Radiation Oncology         [phone] ________________________________  Initial Outpatient Consultation  Name: Sean [name] MRN: [id number]  Date: 09/22/2020  DOB: 20-Nov-1965  XY:IAXKP, Thayer Jew, MD  [name], MD   REFERRING PHYSICIAN: [name], MD  DIAGNOSIS:     ICD-10-CM   1. Brain metastasis ([place])  C79.31      HISTORY OF PRESENT ILLNESS::Sean [name] is a 55 y.o. male who was seen in the ED on 09/14/2020 for evaluation of fall and syncope and 1 to 2 weeks of general malaise,diplopia, more recent left-sided weakness, nausea, vomiting, and a brief loss of consciousness with urinary incontinence reported by EMS.He thought he had a viral illness and had 2 negative COVID tests prior to coming in to hospital CTA of head and neck at that time showed multiple hemorrhagic lesions in the bilateral cerebral hemispheres. CT of chest/abdomen/pelvis also performed at that time revealed a 1.4 x 1.0 cm right upper lobe subpleural nodule, which could reflect primary or metastatic disease. There were also noted to be multiple small hypodensities within the liver parenchyma, suspicious for liver metastases, in addition to a 1.6 cm hypodensity in the upper pole of the right kidney that was indeterminate.  MRI of abdomen on 09/15/2020 showed multifocal metastatic disease in the liver with intrinsic T1 hyperintensity, raising question of metastatic melanoma based on signal characteristics, small size, and multiplicity. Other differential considerations included carcinoid tumor given the appearance of diffuse-weighted imaging.  Ultrasound-guided left liver biopsy on the date of 09/18/2020 revealed benign liver parenchyma with steatosis, but no evidence of malignancy.  MRI of the brain on 09/18/2020 demonstrated multiple hemorrhagic lesions in the brain with surrounding edema most compatible with metastatic disease. All lesions contained T1 hyperintensity compatible with methemoglobin, which  could obscure enhancement and is difficult to see on most lesions. However, there was enhancement of a small satellite lesion in the left frontal lobe and also enhancement of a hemorrhagic lesion in the left parietal lobe that extended towards the occipital horn. Findings were compatible with metastatic disease. Possible melanoma.  To my eye he has 13 lesions in his brain and I will verify this with radiology personally during treatment planning.  I have personally discussed the patient with Dr. Emelda Brothers - plan for: Pre-operative SRS followed by craniotomy to resect larger lesions on the LEFT side (left frontal and occipital).  He saw Dr. Venetia Constable prior to today's appointment  Under care of Dr. Betsy Coder; I have also personally discussed the patient with Dr. [name] 09/20/2020 --Disposition:  Sean [name] appears stable.  The liver biopsy on 09/18/2020 is nondiagnostic.  I discussed the differential diagnosis with Sean [name] and his wife.  The differential includes metastatic disease from multiple potential primary tumor sites and less likely a nonmalignant process.  He does not have symptoms to suggest an infection.  We reviewed the MRI and CT images.  I discussed the case with Dr. Isidore Moos.  I will ask interventional radiology to consider a biopsy of the right upper lobe subpleural nodule.  If this cannot be obtained then we will ask neurosurgery perform a diagnostic/therapeutic brain biopsy on a dominant brain lesion.  Sean [name] will continue Decadron and Keppra.  I refilled his prescription for hydrocodone.  He will return for an office visit next week.  He will call in the interim for new symptoms.  Upon further discussion with Dr. [name] it is apparent that biopsy of the right upper lung nodule is  not considered feasible by radiology; hence the team has decided to proceed with palliative and diagnostic craniotomy after SRS.  Dose of Decadron, if applicable: 4 mg PO twice a  day  Recent neurologic symptoms, if any:   Seizures: Not since initial hospitalization   Headaches: Occasionally. Before hospitalization he had intense frontal headaches that caused double vision. Symptoms have improved with steroid  Nausea: Patient denies  Dizziness/ataxia: Patient denies  Difficulty with hand coordination: Patient denies   Focal numbness/weakness: Reports strength and coordination are returing to his left leg/foot  Visual deficits/changes: Initially had double vision, but it has resolved since hospitalization  Confusion/Memory deficits: Patient denies  Extraocular movements were not intact at initial hospitalization but this resolved with steroids  SAFETY ISSUES:  Prior radiation? No  Pacemaker/ICD? No  Possible current pregnancy? N/A  Is the patient on methotrexate? No  Additional Complaints / other details:  Scheduled for PET scan on 09/25/2020. Met with Dr. Emelda Brothers this morning.   He has no known history of skin cancers   PREVIOUS RADIATION THERAPY: No  PAST MEDICAL HISTORY:  has a past medical history of Hyperlipidemia.    PAST SURGICAL HISTORY: Past Surgical History:  Procedure Laterality Date  . HERNIA REPAIR      FAMILY HISTORY: family history includes Colon polyps in his brother, brother, and father; Hypertension in his father; Osteoarthritis in his mother.  SOCIAL HISTORY:  reports that he has quit smoking. He has never used smokeless tobacco. He reports current alcohol use. He reports that he does not use drugs.  ALLERGIES: Patient has no known allergies.  MEDICATIONS:  Current Outpatient Medications  Medication Sig Dispense Refill  . acetaminophen (TYLENOL) 500 MG tablet Take 500 mg by mouth every 6 (six) hours as needed for mild pain.    Marland Kitchen dexamethasone (DECADRON) 4 MG tablet Take 1 tablet (4 mg total) by mouth 2 (two) times daily. 60 tablet 0  . HYDROcodone-acetaminophen (NORCO/VICODIN) 5-325 MG tablet Take 1-2  tablets by mouth every 6 (six) hours as needed for moderate pain. 60 tablet 0  . levETIRAcetam (KEPPRA) 500 MG tablet Take 1 tablet (500 mg total) by mouth 2 (two) times daily. 60 tablet 0  . Multiple Vitamin (MULTIVITAMIN WITH MINERALS) TABS tablet Take 1 tablet by mouth daily.    . pantoprazole (PROTONIX) 40 MG tablet Take 1 tablet (40 mg total) by mouth daily at 12 noon. (Patient taking differently: Take 40 mg by mouth daily as needed (Heartburn).) 30 tablet 0  . naproxen sodium (ALEVE) 220 MG tablet Take 440-660 mg by mouth daily as needed (arthritis).    . ondansetron (ZOFRAN) 4 MG tablet Take 1 tablet (4 mg total) by mouth daily as needed for nausea or vomiting. 30 tablet 0   No current facility-administered medications for this encounter.    REVIEW OF SYSTEMS:  As above.   PHYSICAL EXAM:  weight is 214 lb 6.4 oz (97.3 kg). His tympanic temperature is 97.9 F (36.6 C). His blood pressure is 150/91 (abnormal) and his pulse is 88. His respiration is 20 and oxygen saturation is 100%.   General: Alert and oriented, in no acute distress -he provides a good history HEENT: Head is normocephalic. Extraocular movements are intact. Oropharynx is clear.  No nystagmus. Heart: Regular in rate and rhythm with no murmurs, rubs, or gallops. Chest: Clear to auscultation bilaterally, with no rhonchi, wheezes, or rales. Abdomen: Soft, nontender, nondistended, with no rigidity or guarding. Musculoskeletal: Notable for left foot drop that  he is able to partially correct with effort Neurologic: Cranial nerves II through XII are grossly intact. No obvious focalities. Speech is fluent. Coordination is intact. Notable for left foot drop that he is able to partially correct with effort Psychiatric: Judgment and insight are intact. Affect is appropriate.   LABORATORY DATA:  Lab Results  Component Value Date   WBC 14.3 (H) 09/16/2020   HGB 16.0 09/16/2020   HCT 46.4 09/16/2020   MCV 90.1 09/16/2020   PLT 296  09/16/2020   CMP     Component Value Date/Time   NA 136 09/16/2020 0307   K 3.7 09/16/2020 0307   CL 100 09/16/2020 0307   CO2 25 09/16/2020 0307   GLUCOSE 128 (H) 09/16/2020 0307   BUN 17 09/16/2020 0307   CREATININE 0.88 09/16/2020 0307   CALCIUM 8.9 09/16/2020 0307   PROT 6.8 09/16/2020 0307   ALBUMIN 3.7 09/16/2020 0307   AST 52 (H) 09/16/2020 0307   ALT 139 (H) 09/16/2020 0307   ALKPHOS 101 09/16/2020 0307   BILITOT 1.1 09/16/2020 0307   GFRNONAA >60 09/16/2020 0307         RADIOGRAPHY: CT ANGIO HEAD W OR WO CONTRAST  Result Date: 09/14/2020 CLINICAL DATA:  Neuro deficit, acute, stroke suspected. EXAM: CT ANGIOGRAPHY HEAD AND NECK TECHNIQUE: Multidetector CT imaging of the head and neck was performed using the standard protocol during bolus administration of intravenous contrast. Multiplanar CT image reconstructions and MIPs were obtained to evaluate the vascular anatomy. Carotid stenosis measurements (when applicable) are obtained utilizing NASCET criteria, using the distal internal carotid diameter as the denominator. CONTRAST:  149m OMNIPAQUE IOHEXOL 300 MG/ML  SOLN COMPARISON:  Head CT September 14, 2020. FINDINGS: CT HEAD FINDINGS Brain: Postcontrast images showed multiple hemorrhagic lesions (series 12), as seen on prior CT: - measuring 17 mm in the right frontal lobe with mild surrounding edema, image 28; - 6 mm in the left frontal lobe, minimal surrounding edema, image 25; - 5 mm in the right parietal lobe, no significant edema, image 23; - 15 mm the left parietal lobe, mild surrounding vasogenic edema, image 23; - 31 mm in the left frontal lobe with surrounding vasogenic edema and effacement of the adjacent cerebral sulci, image 20; - 27 mm in the left occipital lobe, decompressing into the occipital horn of the left lateral ventricle with surrounding vasogenic edema effacing the adjacent cerebral sulci, image 17. In addition to the previously seen hemorrhagic lesions,  enhancing 2 small enhancing lesions are seen in the left frontal lobe measuring 2-3 mm each (image 26) and a 6 mm enhancing lesion is seen in the right occipital lobe (image 13). No hydrocephalus. Evaluation for subarachnoid hemorrhage limited due to intravenous contrast. Vascular: See below. Skull: Normal. Negative for fracture or focal lesion. Sinuses: Imaged portions are clear. Orbits: No acute finding. Review of the MIP images confirms the above findings CTA NECK FINDINGS Aortic arch: Common origin of the innominate and left common carotid arteries from the aortic arch. The left vertebral artery origin is directly from the aortic arch. Imaged portion shows no evidence of aneurysm or dissection. No significant stenosis of the major arch vessel origins. Right carotid system: No evidence of dissection, stenosis (50% or greater) or occlusion. Left carotid system: No evidence of dissection, stenosis (50% or greater) or occlusion. Vertebral arteries: The left vertebral artery origin is directly from the aortic arch and is dominant. No evidence of dissection, stenosis (50% or greater) or occlusion. Skeleton: No aggressive  lesion identified. Other neck: Negative. Upper chest: Negative. Review of the MIP images confirms the above findings CTA HEAD FINDINGS Anterior circulation: No significant stenosis, proximal occlusion, aneurysm, or vascular malformation. Posterior circulation: No significant stenosis, proximal occlusion, aneurysm, or vascular malformation. Venous sinuses: As permitted by contrast timing, patent. Review of the MIP images confirms the above findings IMPRESSION: 1. No large vessel occlusion, hemodynamically significant stenosis, or evidence of dissection. 2. Multiple hemorrhagic lesions are seen in the bilateral cerebral hemispheres as described above. Electronically Signed   By: Pedro Earls M.D.   On: 09/14/2020 22:24   CT ANGIO NECK W OR WO CONTRAST  Result Date:  09/14/2020 CLINICAL DATA:  Neuro deficit, acute, stroke suspected. EXAM: CT ANGIOGRAPHY HEAD AND NECK TECHNIQUE: Multidetector CT imaging of the head and neck was performed using the standard protocol during bolus administration of intravenous contrast. Multiplanar CT image reconstructions and MIPs were obtained to evaluate the vascular anatomy. Carotid stenosis measurements (when applicable) are obtained utilizing NASCET criteria, using the distal internal carotid diameter as the denominator. CONTRAST:  124m OMNIPAQUE IOHEXOL 300 MG/ML  SOLN COMPARISON:  Head CT September 14, 2020. FINDINGS: CT HEAD FINDINGS Brain: Postcontrast images showed multiple hemorrhagic lesions (series 12), as seen on prior CT: - measuring 17 mm in the right frontal lobe with mild surrounding edema, image 28; - 6 mm in the left frontal lobe, minimal surrounding edema, image 25; - 5 mm in the right parietal lobe, no significant edema, image 23; - 15 mm the left parietal lobe, mild surrounding vasogenic edema, image 23; - 31 mm in the left frontal lobe with surrounding vasogenic edema and effacement of the adjacent cerebral sulci, image 20; - 27 mm in the left occipital lobe, decompressing into the occipital horn of the left lateral ventricle with surrounding vasogenic edema effacing the adjacent cerebral sulci, image 17. In addition to the previously seen hemorrhagic lesions, enhancing 2 small enhancing lesions are seen in the left frontal lobe measuring 2-3 mm each (image 26) and a 6 mm enhancing lesion is seen in the right occipital lobe (image 13). No hydrocephalus. Evaluation for subarachnoid hemorrhage limited due to intravenous contrast. Vascular: See below. Skull: Normal. Negative for fracture or focal lesion. Sinuses: Imaged portions are clear. Orbits: No acute finding. Review of the MIP images confirms the above findings CTA NECK FINDINGS Aortic arch: Common origin of the innominate and left common carotid arteries from the aortic  arch. The left vertebral artery origin is directly from the aortic arch. Imaged portion shows no evidence of aneurysm or dissection. No significant stenosis of the major arch vessel origins. Right carotid system: No evidence of dissection, stenosis (50% or greater) or occlusion. Left carotid system: No evidence of dissection, stenosis (50% or greater) or occlusion. Vertebral arteries: The left vertebral artery origin is directly from the aortic arch and is dominant. No evidence of dissection, stenosis (50% or greater) or occlusion. Skeleton: No aggressive lesion identified. Other neck: Negative. Upper chest: Negative. Review of the MIP images confirms the above findings CTA HEAD FINDINGS Anterior circulation: No significant stenosis, proximal occlusion, aneurysm, or vascular malformation. Posterior circulation: No significant stenosis, proximal occlusion, aneurysm, or vascular malformation. Venous sinuses: As permitted by contrast timing, patent. Review of the MIP images confirms the above findings IMPRESSION: 1. No large vessel occlusion, hemodynamically significant stenosis, or evidence of dissection. 2. Multiple hemorrhagic lesions are seen in the bilateral cerebral hemispheres as described above. Electronically Signed   By: KRoylene Reason  Sindy Messing M.D.   On: 09/14/2020 22:24   MR BRAIN W WO CONTRAST  Result Date: 09/18/2020 CLINICAL DATA:  Brain lesions. EXAM: MRI HEAD WITHOUT AND WITH CONTRAST TECHNIQUE: Multiplanar, multiecho pulse sequences of the brain and surrounding structures were obtained without and with intravenous contrast. CONTRAST:  21m GADAVIST GADOBUTROL 1 MMOL/ML IV SOLN COMPARISON:  CT head 09/14/2020 FINDINGS: Brain: Multiple hemorrhagic mass lesions in the brain with surrounding edema. These all show T1 shortening due to methemoglobin or melanin which could be obscuring enhancement. 2.5 cm left frontal lesion with surrounding edema. This does show a small satellite enhancing nodule  laterally measuring about 6 mm. 37 x 19 mm mass in the left occipital lobe with hemorrhage. This lesion does show scattered areas of enhancement particularly extending towards the left occipital horn. There is surrounding edema. Multiple additional hemorrhagic lesions are seen in the brain 5 mm hemorrhagic lesion right occipital lobe axial image 151 3 mm lesion right superior cerebellum axial image 138 12 mm lesion left parietal lobe image 194 6 mm hemorrhagic lesion left frontal lobe axial image 201 5 mm hemorrhagic lesion right medial parietal lobe axial image 203 3 mm lesion left posterior parietal lobe. 4 mm lesion left parietal lobe. 3 mm lesion left lateral parietal lobe axial image 197 Ventricle size normal. No midline shift. Negative for acute infarct. Vascular: Normal arterial flow voids. Skull and upper cervical spine: No focal skeletal abnormality. Sinuses/Orbits: Paranasal sinuses clear.  Negative orbit Other: None IMPRESSION: Multiple hemorrhagic lesions in the brain with surrounding edema most compatible with metastatic disease. All lesions contain T1 hyperintensity compatible with methemoglobin which could obscure enhancement which is difficult to see on most lesions. However there is enhancement of a small satellite lesion in the left frontal lobe and also enhancement of a hemorrhagic lesion in the left parietal lobe extending towards the occipital horn. Findings compatible with metastatic disease. Possible melanoma. Electronically Signed   By: CFranchot GalloM.D.   On: 09/18/2020 14:42   MR ABDOMEN W WO CONTRAST  Result Date: 09/15/2020 CLINICAL DATA:  Metastatic disease, indeterminate RIGHT renal lesion in this 55year old male found to have intracranial abnormalities and abnormalities on recent CT of the chest, abdomen and pelvis in the abdomen EXAM: MRI ABDOMEN WITHOUT AND WITH CONTRAST TECHNIQUE: Multiplanar multisequence MR imaging of the abdomen was performed both before and after the  administration of intravenous contrast. CONTRAST:  175mGADAVIST GADOBUTROL 1 MMOL/ML IV SOLN COMPARISON:  CT chest, abdomen and pelvis of September 14, 2020 FINDINGS: Lower chest: Incidental imaging of the lung bases on MRI with limited assessment is unremarkable. No effusion. No consolidative changes. Hepatobiliary: Numerous areas of diffusion related abnormalities in the hepatic parenchyma, for instance on image 97 of series 6 there are 4 distinct lesions 1 in the posterior RIGHT hepatic lobe likely corresponding to 1 of the areas seen on CT and 2 in the medial RIGHT hepatic lobe near the IVC confluence. Assessment of diffusion weighted images shows greater than 20 scattered foci of subtle restricted diffusion throughout the liver, subtle based on size There is a tiny focus of restricted diffusion along the margin of the posterior RIGHT hemi liver which may be extrahepatic (image 111, series 8) this measures approximately 5 mm. For reference the largest area of signal abnormality in the liver measures 11 mm on image 96 of series 8. The rest of these abnormalities are less than a cm and most within the 3-4 mm size range. The largest area  of diffusion related signal abnormality shows peripheral enhancement and signs of washout measuring 16 mm (image 36, series 18) greatest axial dimension on the contrasted images. Other tiny foci are scattered throughout the liver showing enhancement though not as well seen is on diffusion related images. Scattered tiny foci show intrinsic T1 hyperintensity. No signs of biliary duct dilation or pericholecystic stranding. Pancreas: Normal intrinsic T1 signal. No ductal dilation or sign of inflammation. Spleen:  Normal size spleen without focal lesion. Adrenals/Urinary Tract:  Adrenal glands are normal. Symmetric renal enhancement. No hydronephrosis. No suspicious renal lesion. The area of concern in the RIGHT upper pole discussed on the previous CT evaluation is not visible on MRI  showing subtle wedge-shaped characteristics but slightly more rounded than expected for infarct or pyelonephritis though potentially related focal pyelonephritis given that it is not visible on the current exam. Subtle stranding is noted in this location adjacent to the kidney. Stomach/Bowel: No acute gastrointestinal process to the extent evaluated. Note that there is limited assessment of the gastrointestinal tract on MRI not performed for bowel evaluation. Vascular/Lymphatic: Patent abdominal vasculature. There is no gastrohepatic or hepatoduodenal ligament lymphadenopathy. No retroperitoneal or mesenteric lymphadenopathy. Other: Trace subtle indistinct appearance of the RIGHT posterior hepatic lobe. Area of restricted diffusion along the inferior RIGHT hepatic margin shows intrinsic T1 hyperintensity. Musculoskeletal: No suspicious bone lesions identified. IMPRESSION: Multifocal metastatic disease in the liver with intrinsic T1 hyperintensity, raising the question of metastatic melanoma based on signal characteristics, small size and multiplicity. Other differential considerations would include carcinoid tumor given the appearance on diffusion-weighted imaging. Area of concern in the RIGHT kidney is not visualized on the current exam. Suggest close attention on follow-up to this location. Perhaps this represented a small area of focal nephritis. Could be that this is isointense to parenchyma on baseline images to slightly hyperintense and does not separated cell from the surrounding cortex well on post-contrast images though it cannot be seen on other sequences either on today's study. Hepatic steatosis. Electronically Signed   By: Zetta Bills M.D.   On: 09/15/2020 10:01   CT CHEST ABDOMEN PELVIS W CONTRAST  Result Date: 09/14/2020 CLINICAL DATA:  Intracranial metastases, no known primary malignancy EXAM: CT CHEST, ABDOMEN, AND PELVIS WITH CONTRAST TECHNIQUE: Multidetector CT imaging of the chest,  abdomen and pelvis was performed following the standard protocol during bolus administration of intravenous contrast. CONTRAST:  143m OMNIPAQUE IOHEXOL 300 MG/ML  SOLN COMPARISON:  03/13/2015 FINDINGS: CT CHEST FINDINGS Cardiovascular: The heart and great vessels are unremarkable without pericardial effusion. No evidence of aortic aneurysm or dissection. Mediastinum/Nodes: No enlarged mediastinal, hilar, or axillary lymph nodes. Thyroid gland, trachea, and esophagus demonstrate no significant findings. Lungs/Pleura: Pulmonary nodules are as follows: 1.4 x 1.0 cm right upper lobe subpleural nodule image 51/6. 0.5 cm right lower lobe pulmonary nodule image 82/6. No airspace disease, effusion, or pneumothorax. The central airways are patent. Musculoskeletal: No acute or destructive bony lesions. Reconstructed images demonstrate no additional findings. CT ABDOMEN PELVIS FINDINGS Hepatobiliary: Multiple small hypodensities are seen within the liver parenchyma, too small to characterize but suspicious for metastatic disease given the intracranial findings. Hypodensities are seen within the right lobe liver image 46 and 49, measuring up to 11 mm in size. Hypodensity within the inferior margin of the right lobe liver image 76 measures 13 mm. No intrahepatic duct dilation. The gallbladder is unremarkable. Pancreas: Unremarkable. No pancreatic ductal dilatation or surrounding inflammatory changes. Spleen: Normal in size without focal abnormality. Adrenals/Urinary Tract: 1.6  cm hypodensity upper pole right kidney measures 23 Hounsfield units image 66/5, indeterminate. This is new since prior CT. The remainder of the kidneys enhance normally. No urinary tract calculi or obstruction. The adrenals and bladder are unremarkable. Stomach/Bowel: No bowel obstruction or ileus. Normal appendix right lower quadrant. No bowel wall thickening or inflammatory change. Vascular/Lymphatic: No significant vascular findings are present. No  enlarged abdominal or pelvic lymph nodes. Reproductive: Prostate is unremarkable. Other: No free fluid or free gas. No abdominal wall hernia. Musculoskeletal: No acute or destructive bony lesions. Reconstructed images demonstrate no additional findings. IMPRESSION: 1. A 1.4 x 1.0 cm right upper lobe subpleural nodule, which could reflect primary or metastatic disease. PET-CT may be useful for follow-up. The 0.5 cm right lower lobe nodule be too small to characterize by PET scan, and will require serial follow-up. 2. Multiple small hypodensities within the liver parenchyma, suspicious for liver metastases. 3. A 1.6 cm hypodensity upper pole right kidney, indeterminate. This could be further evaluated with MRI without and with contrast. Electronically Signed   By: Randa Ngo M.D.   On: 09/14/2020 22:15   NM PET Image Initial (PI) Skull Base To Thigh  Result Date: 09/25/2020 CLINICAL DATA:  Initial treatment strategy for metastatic disease of unknown primary. EXAM: NUCLEAR MEDICINE PET SKULL BASE TO THIGH TECHNIQUE: 11.4 mCi F-18 FDG was injected intravenously. Full-ring PET imaging was performed from the skull base to thigh after the radiotracer. CT data was obtained and used for attenuation correction and anatomic localization. Fasting blood glucose: 83 mg/dl COMPARISON:  Chest abdomen pelvis CT 09/14/2020. FINDINGS: Mediastinal blood pool activity: SUV max 2.4 Liver activity: SUV max NA NECK: No hypermetabolic lymph nodes in the neck. Incidental CT findings: none CHEST: Focal hypermetabolism in the right hilum identified with SUV max = 4.8. No definite lymphadenopathy evident on noncontrast CT imaging today. No other suspicious mediastinal or hilar uptake. 10 mm anterior right upper lobe nodule on 87/3 shows low level hypermetabolism with SUV max = 2.0. Scattered additional tiny pulmonary nodules are too small to characterize by PET imaging. Incidental CT findings: Minimal dependent atelectasis bilaterally.  ABDOMEN/PELVIS: Multiple hypermetabolic lesions are seen in the liver,. These appear to be capsular/subcapsular and no abnormality is evident on noncontrast CT imaging. Hypermetabolic lesion adjacent to the falciform ligament in segment III demonstrates SUV max = 6.5. Capsular/subcapsular lesion medial liver at the porta hepatis demonstrates SUV max = 10.4. A third focus of hypermetabolism is seen in inferior tip of the right liver, again with no CT correlate but SUV max = 7.9. No hypermetabolic lymphadenopathy in the abdomen or pelvis. No hypermetabolic abnormality in the pancreas or spleen. No adrenal hypermetabolism. Focal hypermetabolism identified in the right iliopsoas muscles of the pelvis with SUV max = 5.7. No CT correlate. Focal hypermetabolism identified in the abductor muscles of the medial left thigh ( SUV max = 11.6) without underlying lesion by CT imaging. There is a focus of hypermetabolism musculature of the medial left thigh with SUV max = 5.7. Again, no abnormality on CT imaging at this location. Incidental CT findings: Atherosclerotic calcification noted in the abdominal aorta. SKELETON: Mottled uptake is identified in bony anatomy without definite evidence for bony metastases Incidental CT findings: none IMPRESSION: 1. Focal areas of hypermetabolism identified in the right hilum, multiple areas of the liver adjacent to the capsule, and in musculature of the pelvis and left thigh. No CT correlate in any of these locations on today's noncontrast CT imaging. Nevertheless, imaging features  concerning for metastatic disease. Repeat imaging with IV contrast could be used to further evaluate as clinically warranted. 2. 10 mm anterior right upper lobe pulmonary nodule shows low level hypermetabolism. Additional tiny pulmonary nodules are too small to characterize on PET imaging. 3.  Aortic Atherosclerois (ICD10-170.0) Electronically Signed   By: Misty Stanley M.D.   On: 09/25/2020 14:49   US BIOPSY  (LIVER)  Result Date: 09/18/2020 INDICATION: 55 year old male with multiple metastatic lesions of unknown primary. EXAM: ULTRASOUND GUIDED LIVER LESION BIOPSY COMPARISON:  MRI abdomen from 09/15/2020 MEDICATIONS: None ANESTHESIA/SEDATION: Moderate (conscious) sedation was employed during this procedure. A total of Versed 1 mg and Fentanyl 50 mcg was administered intravenously. Moderate Sedation Time: 16 minutes. The patient's level of consciousness and vital signs were monitored continuously by radiology nursing throughout the procedure under my direct supervision. COMPLICATIONS: None immediate. PROCEDURE: Informed written consent was obtained from the patient after a discussion of the risks, benefits and alternatives to treatment. The patient understands and consents the procedure. A timeout was performed prior to the initiation of the procedure. Ultrasound scanning was performed of the right upper abdominal quadrant demonstrates a 0.9 cm hypoechoic mass near the portal triad. The most conspicuous lesion visualized on MRI in the hepatic dome was unable to be visualized sonographically. There is an additional, ill-defined hypoechoic mass just superior to the gallbladder in segment 4 B which measures approximately 1.9 cm in maximum dimension. The segment 4B lesion was selected for biopsy and the procedure was planned. The right upper abdominal quadrant was prepped and draped in the usual sterile fashion. The overlying soft tissues were anesthetized with 1% lidocaine with epinephrine. A 17 gauge, 6.8 cm co-axial needle was advanced into a peripheral aspect of the lesion. This was followed by a total of 3 core biopsies with an 18 gauge core device under direct ultrasound guidance. The coaxial needle tract was embolized with a small amount of Gel-Foam slurry and superficial hemostasis was obtained with manual compression. Post procedural scanning was negative for definitive area of hemorrhage or additional  complication. A dressing was placed. The patient tolerated the procedure well without immediate post procedural complication. IMPRESSION: Technically successful ultrasound guided core needle biopsy of hypoechoic mass in segment 4 B, presumed metastatic lesion. Ruthann Cancer, MD [facility] Electronically Signed   By: Ruthann Cancer MD   On: 09/18/2020 14:40   EEG adult  Result Date: 09/15/2020 Lora Havens, MD     09/15/2020  3:13 PM Patient Name: Garyn Arlotta MRN: [id number] Epilepsy Attending: Lora Havens Referring Physician/Provider: Dr Gean Birchwood Date: 09/15/2020 Duration: 23.25 mins Patient history: 55yo M with seizure like activity. EEG to evaluate for seizure Level of alertness: Awake, asleep AEDs during EEG study: LEV Technical aspects: This EEG study was done with scalp electrodes positioned according to the 10-20 International system of electrode placement. Electrical activity was acquired at a sampling rate of 500Hz  and reviewed with a high frequency filter of 70Hz  and a low frequency filter of 1Hz . EEG data were recorded continuously and digitally stored. Description: The posterior dominant rhythm consists of 9 Hz activity of moderate voltage (25-35 uV) seen predominantly in posterior head regions, symmetric and reactive to eye opening and eye closing. Sleep was characterized by vertex waves, sleep spindles (12 to 14 Hz), maximal frontocentral region. Hyperventilation and photic stimulation were not performed.   IMPRESSION: This study is within normal limits. No seizures or epileptiform discharges were seen throughout the  recording. Lora Havens   CT HEAD CODE STROKE WO CONTRAST  Result Date: 09/14/2020 CLINICAL DATA:  Code stroke.  Acute neuro deficit.  Vomiting. EXAM: CT HEAD WITHOUT CONTRAST TECHNIQUE: Contiguous axial images were obtained from the base of the skull through the vertex without intravenous contrast.  COMPARISON:  None. FINDINGS: Brain: Multiple areas of hemorrhage in the brain. Many these are associated with mass lesions and edema. Appearance is most consistent with hemorrhagic metastasis. Largest lesion in the left frontal lobe measures 2.8 cm in diameter with mild hemorrhage and mild to moderate edema. Left occipital hemorrhage with surrounding edema measures 2.5 cm. Multiple additional smaller areas of hemorrhage are present in the frontal and parietal lobes bilaterally and in the right occipital lobe. Ventricle size normal. Mild midline shift to the right of approximately 3 mm. Vascular: Negative for hyperdense vessel Skull: No skeletal lesion identified in the calvarium Sinuses/Orbits: Paranasal sinuses clear.  Negative orbit. Other: None IMPRESSION: 1. 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 Mild midline shift to the right. 2. These results were called by telephone at the time of interpretation on 09/14/2020 at 9:44 pm to provider Rory Percy , who verbally acknowledged these results. Electronically Signed   By: Franchot Gallo M.D.   On: 09/14/2020 21:45      IMPRESSION/PLAN: This is a very pleasant 55 year old man with metastatic disease to the brain.  I had a lengthy discussion with the patient after reviewing their MRI results with them.  We spoke about whole brain radiotherapy versus stereotactic radiosurgery to the brain. We spoke about the differing risks benefits and side effects of both of these treatments. During part of our discussion, we spoke about the hair loss, fatigue and cognitive effects that can result from whole brain radiotherapy.  Additionally, we spoke about radionecrosis that can result from stereotactic radiosurgery. I explained that whole brain radiotherapy is more comprehensive and therefore can decrease the chance of recurrences elsewhere in the brain, while stereotactic radiosurgery only treats the areas of gross disease while sparing the  rest of the brain parenchyma.  After lengthy discussion, the patient would like to proceed with stereotactic brain radiosurgery to their metastatic disease.  I have discussed him extensively with medical oncology and neurosurgery.  The plan is to conduct stereotactic radiation therapy to all of the gross lesions in his brain (to my eye I see 13 and I will confirm this with radiology during the treatment planning process) and then proceed with palliative and diagnostic resection of the 2 dominant lesions in the left cerebellar hemisphere.   The patient understands that at this point we do not have a definitive cancer diagnosis (due to difficulty obtaining diagnostic biopsies from other parts of his body) but it is highly likely that he has some type of metastatic cancer.  He understands that there is a small possibility that he could undergo radiation therapy and later find out that he does not in fact have cancer.  He would still like to proceed as planned according to the team's recommendations.  No guarantees of treatment were given.  He asked good questions which I answered to his satisfaction.  He is enthusiastic to proceed; a consent form was signed today acknowledging the risks above, including serious brain injury.  CT simulation will take place today and treatment on January 12.  I plan to deliver 16-20 Gray in 1 fraction to each of the suspicious lesions in his brain.  On date  of service, in total, I spent 60 minutes on this encounter. Patient was seen in person.  __________________________________________   Eppie Gibson, MD  This document serves as a record of services personally performed by Eppie Gibson, MD. It was created on his behalf by Clerance Lav, a trained medical scribe. The creation of this record is based on the scribe's personal observations and the provider's statements to them. This document has been checked and approved by the attending provider.

## 2020-09-25 ENCOUNTER — Other Ambulatory Visit: Payer: Self-pay

## 2020-09-25 ENCOUNTER — Encounter
Admission: RE | Admit: 2020-09-25 | Discharge: 2020-09-25 | Disposition: A | Discharge status: Home / Self Care | Payer: 59 | Source: Ambulatory Visit | Attending: Oncology | Admitting: Oncology

## 2020-09-25 DIAGNOSIS — C7931 Secondary malignant neoplasm of brain: Secondary | ICD-10-CM | POA: Diagnosis not present

## 2020-09-25 LAB — GLUCOSE, CAPILLARY: Glucose-Capillary: 83 mg/dL (ref 70–99)

## 2020-09-25 IMAGING — PT NM PET TUM IMG INITIAL (PI) SKULL BASE T - THIGH
1 of 10 series · 1 of 25 positions shown · non-contrast
Comparison: Chest abdomen pelvis CT [DATE].

CLINICAL DATA: Initial treatment strategy for metastatic disease of
unknown primary.

EXAM:
NUCLEAR MEDICINE PET SKULL BASE TO THIGH
TECHNIQUE: 11.4 mCi F-18 FDG was injected intravenously. Full-ring PET imaging
was performed from the skull base to thigh after the radiotracer. CT
data was obtained and used for attenuation correction and anatomic
localization.
Fasting blood glucose: 83 mg/dl

[Series 3: ct wb 5.0 b30f · axial · 5.0mm · 0.98mm/px · 1 of 329 slices shown]
[im 329/329  brain]
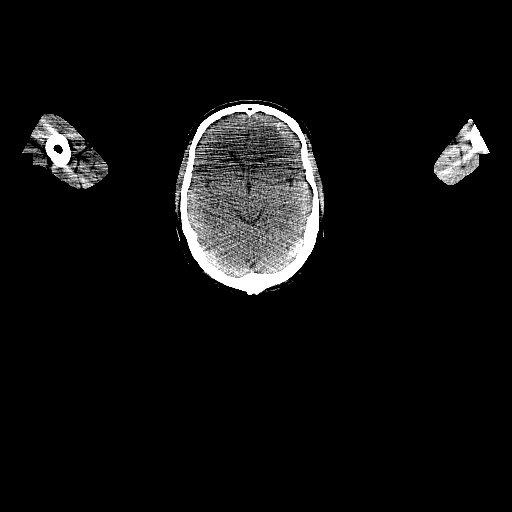

[1 of 25 positions shown; findings below may reference images not displayed]

FINDINGS: Mediastinal blood pool activity: SUV max

Liver activity: SUV max NA

NECK: No hypermetabolic lymph nodes in the neck.

Incidental CT findings: none

CHEST: Focal hypermetabolism in the right hilum identified with SUV
max = 4.8. No definite lymphadenopathy evident on noncontrast CT
imaging today. No other suspicious mediastinal or hilar uptake. 10
mm anterior right upper lobe nodule on 87/3 shows low level
hypermetabolism with SUV max = 2.0. Scattered additional tiny
pulmonary nodules are too small to characterize by PET imaging.

Incidental CT findings: Minimal dependent atelectasis bilaterally.

ABDOMEN/PELVIS: Multiple hypermetabolic lesions are seen in the
liver,. These appear to be capsular/subcapsular and no abnormality
is evident on noncontrast CT imaging. Hypermetabolic lesion adjacent
to the falciform ligament in segment III demonstrates SUV max = 6.5.
Capsular/subcapsular lesion medial liver at the porta hepatis
demonstrates SUV max = 10.4. A third focus of hypermetabolism is
seen in inferior tip of the right liver, again with no CT correlate
but SUV max = 7.9.

No hypermetabolic lymphadenopathy in the abdomen or pelvis. No
hypermetabolic abnormality in the pancreas or spleen. No adrenal
hypermetabolism.

Focal hypermetabolism identified in the right iliopsoas muscles of
the pelvis with SUV max = 5.7. No CT correlate.

Focal hypermetabolism identified in the abductor muscles of the
medial left thigh ( SUV max = 11.6) without underlying lesion by CT
imaging.

There is a focus of hypermetabolism musculature of the medial left
thigh with SUV max = 5.7. Again, no abnormality on CT [HOSPITAL]
this location.

Incidental CT findings: Atherosclerotic calcification noted in the
abdominal aorta.

SKELETON: Mottled uptake is identified in bony anatomy without
definite evidence for bony metastases

Incidental CT findings: none
IMPRESSION: 1. Focal areas of hypermetabolism identified in the right hilum,
multiple areas of the liver adjacent to the capsule, and in
musculature of the pelvis and left thigh. No CT correlate in any of
these locations on today's noncontrast CT imaging. Nevertheless,
imaging features concerning for metastatic disease. Repeat imaging
with IV contrast could be used to further evaluate as clinically
warranted.
2. 10 mm anterior right upper lobe pulmonary nodule shows low level
hypermetabolism. Additional tiny pulmonary nodules are too small to
characterize on PET imaging.
3.  Aortic Atherosclerois ([6X]-170.0)

## 2020-09-25 MED ORDER — FLUDEOXYGLUCOSE F - 18 (FDG) INJECTION
11.4380 | Freq: Once | INTRAVENOUS | Status: AC | PRN
  Administered 2020-09-25: 11.438 via INTRAVENOUS

## 2020-09-26 NOTE — Progress Notes (Signed)
[facility] ([address]), [place] [facility] [place] [address] [place] ([place])  [address] Phone: [phone] Fax: [phone]      Your procedure is scheduled on Friday September 29, 2020.  Report to [facility] Main Entrance "A" at 10:50 A.M., and check in at the Admitting office.  Call [phone] if you have problems the morning of surgery:     Call [phone] if you have any questions prior to your surgery date Monday-Friday 8am-4pm    Remember:  Do not eat or drink after midnight the night before your surgery   Take these medicines the morning of surgery with A SIP OF WATER: Dexamethasone (Decadron) Levetiracetam (Keppra) Pantoprazole (Protonix)  If need you may take the following medications the morning of surgery: Acetaminophen (Tylenol) Hydrocodone-acetaminophen (Norco/Vicodin)  As of today, STOP taking any Aspirin (unless otherwise instructed by your surgeon) Aleve, Naproxen, Ibuprofen, Motrin, Advil, Goody's, BC's, all herbal medications, fish oil, and all vitamins.           Do NOT Smoke (Tobacco/Vaping) or drink Alcohol 24 hours prior to your procedure  If you use a CPAP at night, you may bring all equipment for your overnight stay.   Contacts, glasses, dentures or bridgework may not be worn into surgery.      For patients admitted to the hospital, discharge time will be determined by your treatment team.   Patients discharged the day of surgery will not be allowed to drive home, and someone needs to stay with them for 24 hours.    Special instructions:   [facility]- Preparing For Surgery  Before surgery, you can play an important role. Because skin is not sterile, your skin needs to be as free of germs as possible. You can reduce the number of germs on your skin by washing with CHG (chlorahexidine gluconate) Soap before surgery.  CHG is an antiseptic cleaner which kills germs and bonds with the skin to continue killing germs even  after washing.    Oral Hygiene is also important to reduce your risk of infection.  Remember - BRUSH YOUR TEETH THE MORNING OF SURGERY WITH YOUR REGULAR TOOTHPASTE  Please do not use if you have an allergy to CHG or antibacterial soaps. If your skin becomes reddened/irritated stop using the CHG.  Do not shave (including legs and underarms) for at least 48 hours prior to first CHG shower. It is OK to shave your face.  Please follow these instructions carefully.   1. Shower the NIGHT BEFORE SURGERY and the MORNING OF SURGERY with CHG Soap.   2. If you chose to wash your hair, wash your hair first as usual with your normal shampoo.  3. After you shampoo, rinse your hair and body thoroughly to remove the shampoo.  4. Use CHG as you would any other liquid soap. You can apply CHG directly to the skin and wash gently with a scrungie or a clean washcloth.   5. Apply the CHG Soap to your body ONLY FROM THE NECK DOWN.  Do not use on open wounds or open sores. Avoid contact with your eyes, ears, mouth and genitals (private parts). Wash Face and genitals (private parts)  with your normal soap.   6. Wash thoroughly, paying special attention to the area where your surgery will be performed.  7. Thoroughly rinse your body with warm water from the neck down.  8. DO NOT shower/wash with your normal soap after using and rinsing off the CHG  Soap.  9. Pat yourself dry with a CLEAN TOWEL.  10. Wear CLEAN PAJAMAS to bed the night before surgery  11. Place CLEAN SHEETS on your bed the night of your first shower and DO NOT SLEEP WITH PETS.   Day of Surgery: Shower with CHG soap as directed    Do not wear jewelry             Do not wear lotions, powders, colognes, or deodorant.            Do not shave 48 hours prior to surgery.  Men may shave face and neck.             Wear Clean/Comfortable clothing the morning of surgery Remember to brush your teeth WITH YOUR REGULAR TOOTHPASTE.  Do not bring  valuables to the hospital.            [facility] is not responsible for any belongings or valuables.   Please read over the following fact sheets that you were given.

## 2020-09-27 ENCOUNTER — Other Ambulatory Visit: Payer: Self-pay

## 2020-09-27 ENCOUNTER — Ambulatory Visit
Admission: RE | Admit: 2020-09-27 | Discharge: 2020-09-27 | Disposition: A | Discharge status: Home / Self Care | Payer: 59 | Source: Ambulatory Visit | Attending: Radiation Oncology | Admitting: Radiation Oncology

## 2020-09-27 ENCOUNTER — Other Ambulatory Visit (HOSPITAL_COMMUNITY)
Admission: RE | Admit: 2020-09-27 | Discharge: 2020-09-27 | Disposition: A | Discharge status: Home / Self Care | Payer: 59 | Source: Ambulatory Visit | Attending: Neurological Surgery | Admitting: Neurological Surgery

## 2020-09-27 ENCOUNTER — Encounter (HOSPITAL_COMMUNITY)
Admission: RE | Admit: 2020-09-27 | Discharge: 2020-09-27 | Disposition: A | Discharge status: Home / Self Care | Payer: 59 | Source: Ambulatory Visit | Attending: Neurological Surgery | Admitting: Neurological Surgery

## 2020-09-27 ENCOUNTER — Encounter (HOSPITAL_COMMUNITY): Payer: Self-pay

## 2020-09-27 ENCOUNTER — Encounter: Payer: Self-pay | Admitting: Radiation Oncology

## 2020-09-27 VITALS — BP 139/91 | HR 89 | Temp 97.3°F | Resp 18

## 2020-09-27 DIAGNOSIS — C787 Secondary malignant neoplasm of liver and intrahepatic bile duct: Secondary | ICD-10-CM | POA: Insufficient documentation

## 2020-09-27 DIAGNOSIS — C801 Malignant (primary) neoplasm, unspecified: Secondary | ICD-10-CM | POA: Insufficient documentation

## 2020-09-27 DIAGNOSIS — K769 Liver disease, unspecified: Secondary | ICD-10-CM | POA: Insufficient documentation

## 2020-09-27 DIAGNOSIS — C439 Malignant melanoma of skin, unspecified: Secondary | ICD-10-CM | POA: Diagnosis present

## 2020-09-27 DIAGNOSIS — Z20822 Contact with and (suspected) exposure to covid-19: Secondary | ICD-10-CM | POA: Insufficient documentation

## 2020-09-27 DIAGNOSIS — Z87891 Personal history of nicotine dependence: Secondary | ICD-10-CM | POA: Insufficient documentation

## 2020-09-27 DIAGNOSIS — K760 Fatty (change of) liver, not elsewhere classified: Secondary | ICD-10-CM | POA: Insufficient documentation

## 2020-09-27 DIAGNOSIS — Z79899 Other long term (current) drug therapy: Secondary | ICD-10-CM | POA: Insufficient documentation

## 2020-09-27 DIAGNOSIS — Z510 Encounter for antineoplastic radiation therapy: Secondary | ICD-10-CM | POA: Diagnosis present

## 2020-09-27 DIAGNOSIS — Z01812 Encounter for preprocedural laboratory examination: Secondary | ICD-10-CM | POA: Insufficient documentation

## 2020-09-27 DIAGNOSIS — E785 Hyperlipidemia, unspecified: Secondary | ICD-10-CM | POA: Insufficient documentation

## 2020-09-27 DIAGNOSIS — C7931 Secondary malignant neoplasm of brain: Secondary | ICD-10-CM | POA: Insufficient documentation

## 2020-09-27 DIAGNOSIS — Z7952 Long term (current) use of systemic steroids: Secondary | ICD-10-CM | POA: Insufficient documentation

## 2020-09-27 LAB — TYPE AND SCREEN
ABO/RH(D): O POS
Antibody Screen: NEGATIVE

## 2020-09-27 LAB — CBC
HCT: 48.6 % (ref 39.0–52.0)
Hemoglobin: 16.6 g/dL (ref 13.0–17.0)
MCH: 31.7 pg (ref 26.0–34.0)
MCHC: 34.2 g/dL (ref 30.0–36.0)
MCV: 92.7 fL (ref 80.0–100.0)
Platelets: 326 10*3/uL (ref 150–400)
RBC: 5.24 MIL/uL (ref 4.22–5.81)
RDW: 12.4 % (ref 11.5–15.5)
WBC: 20.7 10*3/uL — ABNORMAL HIGH (ref 4.0–10.5)
nRBC: 0 % (ref 0.0–0.2)

## 2020-09-27 LAB — COMPREHENSIVE METABOLIC PANEL
ALT: 853 U/L — ABNORMAL HIGH (ref 0–44)
AST: 102 U/L — ABNORMAL HIGH (ref 15–41)
Albumin: 3.3 g/dL — ABNORMAL LOW (ref 3.5–5.0)
Alkaline Phosphatase: 108 U/L (ref 38–126)
Anion gap: 10 (ref 5–15)
BUN: 17 mg/dL (ref 6–20)
CO2: 26 mmol/L (ref 22–32)
Calcium: 8.9 mg/dL (ref 8.9–10.3)
Chloride: 100 mmol/L (ref 98–111)
Creatinine, Ser: 0.86 mg/dL (ref 0.61–1.24)
GFR, Estimated: >60 mL/min (ref >60)
Glucose, Bld: 132 mg/dL — ABNORMAL HIGH (ref 70–99)
Potassium: 3.9 mmol/L (ref 3.5–5.1)
Sodium: 136 mmol/L (ref 135–145)
Total Bilirubin: 0.9 mg/dL (ref 0.3–1.2)
Total Protein: 6.3 g/dL — ABNORMAL LOW (ref 6.5–8.1)

## 2020-09-27 LAB — SARS CORONAVIRUS 2 (TAT 6-24 HRS): SARS Coronavirus 2: NEGATIVE

## 2020-09-27 NOTE — Progress Notes (Signed)
  Name: Sean [name]  MRN: [id number]  Date: 09/27/2020   DOB: 1966-06-30  Stereotactic Radiosurgery Operative Note  PRE-OPERATIVE DIAGNOSIS:  Multiple Brain Metastases  POST-OPERATIVE DIAGNOSIS:  Multiple Brain Metastases  PROCEDURE:  Stereotactic Radiosurgery  SURGEON:  Judith Part, MD  NARRATIVE: The patient underwent a radiation treatment planning session in the radiation oncology simulation suite under the care of the radiation oncology physician and physicist.  I participated closely in the radiation treatment planning afterwards. The patient underwent planning CT which was fused to 3T high resolution MRI with 1 mm axial slices.  These images were fused on the planning system.  We contoured the gross target volumes and subsequently expanded this to yield the Planning Target Volume. I actively participated in the planning process.  I helped to define and review the target contours and also the contours of the optic pathway, eyes, brainstem and selected nearby organs at risk.  All the dose constraints for critical structures were reviewed and compared to AAPM Task Group 101.  The prescription dose conformity was reviewed.  I approved the plan electronically.    Accordingly, Sean [name] was brought to the TrueBeam stereotactic radiation treatment linac and placed in the custom immobilization mask.  The patient was aligned according to the IR fiducial markers with BrainLab Exactrac, then orthogonal x-rays were used in ExacTrac with the 6DOF robotic table and the shifts were made to align the patient  Sean [name] received stereotactic radiosurgery uneventfully.    Lesions treated:  10   Complex lesions treated:  1 (>3.5 cm, <65mm of optic path, or within the brainstem)   The detailed description of the procedure is recorded in the radiation oncology procedure note.  I was present for the duration of the procedure.  DISPOSITION:  Following delivery, the patient was transported to  nursing in stable condition and monitored for possible acute effects to be discharged to home in stable condition with follow-up in one month.  Judith Part, MD 09/27/2020 3:21 PM

## 2020-09-27 NOTE — Progress Notes (Addendum)
PCP - Dr. Deland Pretty Cardiologist - denies  PPM/ICD - denies  Chest x-ray - NA EKG - 09/14/2020 Stress Test - denies ECHO - denies Cardiac Cath - denies  Sleep Study - denie CPAP - N/A  DM: denies  Blood Thinner Instructions: N/A Aspirin Instructions: N/A  ERAS Protcol - No  COVID TEST- Scheduled for today 09/27/2020  Anesthesia review: YES, requested last office note from PCP, abnormal labs Abnormal labs 09/16/2020. Per Ebony Hail okay to re-draw labs at PAT appointment  Patient denies shortness of breath, fever, cough and chest pain at PAT appointment  All instructions explained to the patient, with a verbal understanding of the material. Patient agrees to go over the instructions while at home for a better understanding. Patient also instructed to self quarantine after being tested for COVID-19. The opportunity to ask questions was provided.

## 2020-09-27 NOTE — Progress Notes (Signed)
Abnormal labs resulted, Dr. Zada Finders and J. Hanks notified via [employer].

## 2020-09-27 NOTE — Progress Notes (Addendum)
Anesthesia Chart Review:  Case: [id number] Date/Time: 09/29/20 1235   Procedures:      Left craniotomy for tumor resection with brainlab (Left )     APPLICATION OF CRANIAL NAVIGATION (N/A ) - RM 20   Anesthesia type: General   Pre-op diagnosis: Brain tumor   Location: [facility] OR ROOM 21 / [facility]   Surgeons: Judith Part, MD      DISCUSSION: Patient is a 55 year old male scheduled for the above procedure.  History includes former smoker, HLD, brain/liver lesions (09/14/20). Reported currently alcohol intake a 2-3 glasses of wine/day.   - [facility] admission 09/14/20-09/18/20 for fever, vomiting, fall/LOC (likely urinary incontinence, no witnessed seizure activity), 3 days history of LLE weakness, and diplopia (09/03/20). CT head showed bilateral hemorrhagic metastases.  Neurology started on dexamethasone and Keppra with seizure precautions. CT chest/abd/pelvis showed a RUL/RLL lung nodules, liver lesions suggestive of metases and indeterminate right kidney lesion. Small flat brown mold also noted at the plantar surface of his right foot. LFTs elevated and thought likely due to liver mets and/or alcohol dependence (~ 4-5 glasses of wine/day). Oncology consulted [name], Dominica Severin, MD). IR consulted for liver biopsy of 4B lesion which did not show malignancy. He had out-patient HEM-ONC and RAD-ONC follow-up. Radiology did not think they could easily access lung nodules for biopsy, so he was referred to neurosurgery for pre-operative SRS followed by craniotomy to resect larger left brain lesions. Per oncology notes, "The differential includes metastatic disease from multiple potential primary tumor sites and less likely a nonmalignant process."  He last had labs on 09/16/20, but given history of elevated LFTs and leukocytosis (in setting of Decadron), labs were repeated at 09/27/20 PAT visit. Results showed further increase in LFT (AST 102, ALT 853, up from AST 52, ALT 139 on 09/16/20) and WBC (20.7, up from 14.3  on 09/16/20) with normal PLT count 326. PT/INR 13.5/1.1 and PTT 30 on 09/14/20. He has regular alcohol intake and concern for multifocal metastatic disease in the liver by imaging (although biopsy negative) which may account for continued elevated LFTs. Discussed with anesthesiologist Annye Asa, MD. Since further increase since 09/14/20, recommend repeat PT/PTT on the day of surgery as well as a repeat PLT count. I have notified Jessica at Dr. Colleen Can office and also sent a staff message to him and Dr. [name]. If no new changes, then would defer additional preoperative recommendations, if any, to oncology and/or surgeon. (UPDATE 09/28/20 8:57 AM: Both Dr. [name] and Dr. Zada Finders felt leukocytosis likely largely due to Decadron. Dr. Zada Finders is okay with proceeding if coags remain unremarkable.)  [employer] COVID-19 vaccine 12/22/19. 09/28/19 presurgical COVID-19 test is in process.   VS: BP (!) 138/99   Pulse 100   Temp 36.8 C (Oral)   Resp 18   Ht 5' 11.5" (1.816 m)   Wt 96.1 kg   SpO2 99%   BMI 29.14 kg/m    PROVIDERS: Deland Pretty, MD is PCP [name], MD is Edson Snowball, MD & Eppie Gibson, MD are RAD-ONC   LABS: Preoperative labs noted. See DISCUSSION.  (all labs ordered are listed, but only abnormal results are displayed)  Labs Reviewed  COMPREHENSIVE METABOLIC PANEL - Abnormal; Notable for the following components:      Result Value   Glucose, Bld 132 (*)    Total Protein 6.3 (*)    Albumin 3.3 (*)    AST 102 (*)    ALT 853 (*)  All other components within normal limits  CBC - Abnormal; Notable for the following components:   WBC 20.7 (*)    All other components within normal limits  TYPE AND SCREEN    09/14/20 09/15/20 09/16/20 09/27/20  Alk Phos 108 113 101 108  AST 64 82 52 102  ALT 102 141 139 853  Total Bilirubin 1.4 1.6 1.1 0.9    CBC Latest Ref Rng & Units 09/27/2020 09/16/2020 09/15/2020  WBC 4.0 - 10.5 K/uL 20.7(H) 14.3(H)  16.5(H)  Hemoglobin 13.0 - 17.0 g/dL 16.6 16.0 17.2(H)  Hematocrit 39.0 - 52.0 % 48.6 46.4 49.7  Platelets 150 - 400 K/uL 326 296 284    OTHER: EEG 09/15/20: IMPRESSION: This study is within normal limits. No seizures or epileptiform discharges were seen throughout the recording.   IMAGES: PET Scan 09/25/20: IMPRESSION: 1. Focal areas of hypermetabolism identified in the right hilum, multiple areas of the liver adjacent to the capsule, and in musculature of the pelvis and left thigh. No CT correlate in any of these locations on today's noncontrast CT imaging. Nevertheless, imaging features concerning for metastatic disease. Repeat imaging with IV contrast could be used to further evaluate as clinically warranted. 2. 10 mm anterior right upper lobe pulmonary nodule shows low level hypermetabolism. Additional tiny pulmonary nodules are too small to characterize on PET imaging. 3.  Aortic Atherosclerois (ICD10-170.0)  MRI Brain 09/18/20: IMPRESSION: Multiple hemorrhagic lesions in the brain with surrounding edema most compatible with metastatic disease. All lesions contain T1 hyperintensity compatible with methemoglobin which could obscure enhancement which is difficult to see on most lesions. However there is enhancement of a small satellite lesion in the left frontal lobe and also enhancement of a hemorrhagic lesion in the left parietal lobe extending towards the occipital horn. Findings compatible with metastatic disease. Possible melanoma.  MRI Abd 09/15/20: IMPRESSION: - Multifocal metastatic disease in the liver with intrinsic T1 hyperintensity, raising the question of metastatic melanoma based on signal characteristics, small size and multiplicity. Other differential considerations would include carcinoid tumor given the appearance on diffusion-weighted imaging. - Area of concern in the RIGHT kidney is not visualized on the current exam. Suggest close attention on  follow-up to this location. Perhaps this represented a small area of focal nephritis. Could be that this is isointense to parenchyma on baseline images to slightly hyperintense and does not separated cell from the surrounding cortex well on post-contrast images though it cannot be seen on other sequences either on today's study. - Hepatic steatosis. (S/p left levier needle core biopsy 09/18/20: Benign liver parenchyma with steatosis. No evidence of malignancy. HEP 1 and Glypican-3 are positive in liver parenchyma. Arginase-1 is negative. S-100, Melan-A, SOX-10, PAX 8 and CA-IX are negative for  an occult process. The specimen may not be representative of the clinically apparent lesion.Recommend clinicoradiologic correlation and further evaluation as indicated.)  CTA chest/abd/pelvis 09/14/20: IMPRESSION: 1. A 1.4 x 1.0 cm right upper lobe subpleural nodule, which could reflect primary or metastatic disease. PET-CT may be useful for follow-up. The 0.5 cm right lower lobe nodule be too small to characterize by PET scan, and will require serial follow-up. 2. Multiple small hypodensities within the liver parenchyma, suspicious for liver metastases. 3. A 1.6 cm hypodensity upper pole right kidney, indeterminate. This could be further evaluated with MRI without and with contrast.  CT head/CTA head/neck 09/14/20: IMPRESSION: 1. No large vessel occlusion, hemodynamically significant stenosis, or evidence of dissection. 2. Multiple hemorrhagic lesions are seen in the  bilateral cerebral hemispheres as described above.   EKG: 09/15/20: Sinus rhythm Minimal ST depression, inferior leads seen on prior Otherwise no significant change Confirmed by Deno Etienne [phone]) on 09/16/2020 2:10:25 PM   CV: N/A  Past Medical History:  Diagnosis Date  . Brain lesion 09/14/2020  . Hyperlipidemia   . Liver lesion 09/14/2020    Past Surgical History:  Procedure Laterality Date  . HERNIA REPAIR      left side inguinal hernia about 18-20 years    MEDICATIONS: . acetaminophen (TYLENOL) 500 MG tablet  . dexamethasone (DECADRON) 4 MG tablet  . HYDROcodone-acetaminophen (NORCO/VICODIN) 5-325 MG tablet  . levETIRAcetam (KEPPRA) 500 MG tablet  . Multiple Vitamin (MULTIVITAMIN WITH MINERALS) TABS tablet  . naproxen sodium (ALEVE) 220 MG tablet  . ondansetron (ZOFRAN) 4 MG tablet  . pantoprazole (PROTONIX) 40 MG tablet   No current facility-administered medications for this encounter.    Myra Gianotti, PA-C Surgical Short Stay/Anesthesiology [facility] Phone [phone] [facility] Phone [phone] 09/27/2020 3:09 PM

## 2020-09-27 NOTE — Progress Notes (Signed)
Nurse monitoring complete status post 1 of 1 SRS treatments. Patient without complaints. Patient denies new or worsening neurologic symptoms. headache, dizziness, nausea, diplopia or ringing in the ears. Vitals stable. Instructed patient to avoid strenuous activity for the next 24 hours. Instructed patient to continue decadron doses, and that Dr. Zada Finders will handle his steroid taper after his surgery on Friday 09/29/20. Instructed patient to call [phone] with needs related to treatment after hours or over the weekend. Patient verbalized understanding and ambulated out of clinic unassisted   Vitals:   09/27/20 1511  BP: (!) 139/91  Pulse: 89  Resp: 18  Temp: (!) 97.3 F (36.3 C)

## 2020-09-27 NOTE — Anesthesia Preprocedure Evaluation (Addendum)
Anesthesia Evaluation  Patient identified by MRN, date of birth, ID band Patient awake    Reviewed: Allergy & Precautions, H&P , NPO status , Patient's Chart, lab work & pertinent test results  Airway Mallampati: III  TM Distance: >3 FB Neck ROM: Full    Dental no notable dental hx. (+) Teeth Intact, Dental Advisory Given   Pulmonary neg pulmonary ROS, former smoker,    Pulmonary exam normal breath sounds clear to auscultation       Cardiovascular negative cardio ROS   Rhythm:Regular Rate:Normal     Neuro/Psych Seizures -, Well Controlled,  negative psych ROS   GI/Hepatic negative GI ROS, (+)     substance abuse  alcohol use,   Endo/Other  negative endocrine ROS  Renal/GU negative Renal ROS  negative genitourinary   Musculoskeletal   Abdominal   Peds  Hematology negative hematology ROS (+)   Anesthesia Other Findings   Reproductive/Obstetrics negative OB ROS                           Anesthesia Physical Anesthesia Plan  ASA: III  Anesthesia Plan: General   Post-op Pain Management:    Induction: Intravenous  PONV Risk Score and Plan: 3 and Ondansetron, Dexamethasone and Midazolam  Airway Management Planned: Oral ETT  Additional Equipment: Arterial line  Intra-op Plan:   Post-operative Plan: Extubation in OR and Possible Post-op intubation/ventilation  Informed Consent: I have reviewed the patients History and Physical, chart, labs and discussed the procedure including the risks, benefits and alternatives for the proposed anesthesia with the patient or authorized representative who has indicated his/her understanding and acceptance.     Dental advisory given  Plan Discussed with: CRNA  Anesthesia Plan Comments: (PAT note written 09/27/2020 by Myra Gianotti, PA-C. )       Anesthesia Quick Evaluation

## 2020-09-28 ENCOUNTER — Inpatient Hospital Stay: Payer: 59 | Admitting: Oncology

## 2020-09-28 ENCOUNTER — Other Ambulatory Visit: Payer: Self-pay

## 2020-09-28 ENCOUNTER — Inpatient Hospital Stay: Payer: 59

## 2020-09-28 ENCOUNTER — Telehealth: Payer: Self-pay | Admitting: Oncology

## 2020-09-28 VITALS — BP 166/92 | HR 92 | Temp 98.1°F | Resp 18 | Ht 71.0 in | Wt 214.6 lb

## 2020-09-28 DIAGNOSIS — R569 Unspecified convulsions: Secondary | ICD-10-CM | POA: Diagnosis not present

## 2020-09-28 DIAGNOSIS — R748 Abnormal levels of other serum enzymes: Secondary | ICD-10-CM | POA: Diagnosis not present

## 2020-09-28 DIAGNOSIS — H539 Unspecified visual disturbance: Secondary | ICD-10-CM | POA: Diagnosis not present

## 2020-09-28 DIAGNOSIS — Z5112 Encounter for antineoplastic immunotherapy: Secondary | ICD-10-CM | POA: Diagnosis present

## 2020-09-28 DIAGNOSIS — Z79899 Other long term (current) drug therapy: Secondary | ICD-10-CM | POA: Diagnosis not present

## 2020-09-28 DIAGNOSIS — C7931 Secondary malignant neoplasm of brain: Secondary | ICD-10-CM

## 2020-09-28 DIAGNOSIS — C439 Malignant melanoma of skin, unspecified: Secondary | ICD-10-CM | POA: Diagnosis present

## 2020-09-28 LAB — CMP (CANCER CENTER ONLY)
ALT: 683 U/L (ref 0–44)
AST: 73 U/L — ABNORMAL HIGH (ref 15–41)
Albumin: 3.9 g/dL (ref 3.5–5.0)
Alkaline Phosphatase: 108 U/L (ref 38–126)
Anion gap: 10 (ref 5–15)
BUN: 27 mg/dL — ABNORMAL HIGH (ref 6–20)
CO2: 28 mmol/L (ref 22–32)
Calcium: 9.2 mg/dL (ref 8.9–10.3)
Chloride: 97 mmol/L — ABNORMAL LOW (ref 98–111)
Creatinine: 0.77 mg/dL (ref 0.61–1.24)
GFR, Estimated: >60 mL/min (ref >60)
Glucose, Bld: 110 mg/dL — ABNORMAL HIGH (ref 70–99)
Potassium: 4.1 mmol/L (ref 3.5–5.1)
Sodium: 135 mmol/L (ref 135–145)
Total Bilirubin: 0.7 mg/dL (ref 0.3–1.2)
Total Protein: 7.1 g/dL (ref 6.5–8.1)

## 2020-09-28 NOTE — Progress Notes (Signed)
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 returns as scheduled.  He reports feeling well.  Left foot weakness has resolved.  Altered vision has resolved.  No specific complaint.  He is not drinking alcohol at present.  No new complaint.  He underwent SRS to 10 brain lesions yesterday.  He reports tolerating the treatment well.  He continues Decadron.  He is scheduled for resection of 2 brain lesions tomorrow. Objective:  Vital signs in last 24 hours:  Blood pressure (!) 166/92, pulse 92, temperature 98.1 F (36.7 C), temperature source Tympanic, resp. rate 18, height 5\' 11"  (1.803 m), weight 214 lb 9.6 oz (97.3 kg), SpO2 99 %.    HEENT: No thrush Resp: Lungs clear bilaterally Cardio: Regular rate and rhythm GI: No hepatosplenomegaly, no mass, nontender Vascular: No leg edema Neuro: Alert and oriented, the extraocular movements appear intact, very minimal weakness with dorsiflexion of the left foot Musculoskeletal: Left thigh without tenderness or palpable mass   Lab Results:  Lab Results  Component Value Date   WBC 20.7 (H) 09/27/2020   HGB 16.6 09/27/2020   HCT 48.6 09/27/2020   MCV 92.7 09/27/2020   PLT 326 09/27/2020   NEUTROABS 15.8 (H) 09/14/2020    CMP  Lab Results  Component Value Date   NA 136 09/27/2020   K 3.9 09/27/2020   CL 100 09/27/2020   CO2 26 09/27/2020   GLUCOSE 132 (H) 09/27/2020   BUN 17 09/27/2020   CREATININE 0.86 09/27/2020   CALCIUM 8.9 09/27/2020   PROT 6.3 (L) 09/27/2020   ALBUMIN 3.3 (L) 09/27/2020   AST 102 (H) 09/27/2020   ALT 853 (H) 09/27/2020   ALKPHOS 108 09/27/2020   BILITOT 0.9 09/27/2020   GFRNONAA >60 09/27/2020    Lab Results  Component Value Date   CEA1 0.6 09/15/2020     Imaging:  NM PET Image Initial (PI) Skull Base To Thigh  Result Date: 09/25/2020 CLINICAL DATA:  Initial treatment strategy for metastatic  disease of unknown primary. EXAM: NUCLEAR MEDICINE PET SKULL BASE TO THIGH TECHNIQUE: 11.4 mCi F-18 FDG was injected intravenously. Full-ring PET imaging was performed from the skull base to thigh after the radiotracer. CT data was obtained and used for attenuation correction and anatomic localization. Fasting blood glucose: 83 mg/dl COMPARISON:  Chest abdomen pelvis CT 09/14/2020. FINDINGS: Mediastinal blood pool activity: SUV max 2.4 Liver activity: SUV max NA NECK: No hypermetabolic lymph nodes in the neck. Incidental CT findings: none CHEST: Focal hypermetabolism in the right hilum identified with SUV max = 4.8. No definite lymphadenopathy evident on noncontrast CT imaging today. No other suspicious mediastinal or hilar uptake. 10 mm anterior right upper lobe nodule on 87/3 shows low level hypermetabolism with SUV max = 2.0. Scattered additional tiny pulmonary nodules are too small to characterize by PET imaging. Incidental CT findings: Minimal dependent atelectasis bilaterally. ABDOMEN/PELVIS: Multiple hypermetabolic lesions are seen in the liver,. These appear to be capsular/subcapsular and no abnormality is evident on noncontrast CT imaging. Hypermetabolic lesion adjacent to the falciform ligament in segment III demonstrates SUV max = 6.5. Capsular/subcapsular lesion medial liver at the porta hepatis demonstrates SUV max = 10.4. A third focus of hypermetabolism is seen in inferior tip of the right liver, again with no CT correlate but SUV max = 7.9. No hypermetabolic lymphadenopathy in the abdomen or pelvis. No hypermetabolic abnormality in the pancreas or spleen. No adrenal hypermetabolism.  Focal hypermetabolism identified in the right iliopsoas muscles of the pelvis with SUV max = 5.7. No CT correlate. Focal hypermetabolism identified in the abductor muscles of the medial left thigh ( SUV max = 11.6) without underlying lesion by CT imaging. There is a focus of hypermetabolism musculature of the medial left  thigh with SUV max = 5.7. Again, no abnormality on CT imaging at this location. Incidental CT findings: Atherosclerotic calcification noted in the abdominal aorta. SKELETON: Mottled uptake is identified in bony anatomy without definite evidence for bony metastases Incidental CT findings: none IMPRESSION: 1. Focal areas of hypermetabolism identified in the right hilum, multiple areas of the liver adjacent to the capsule, and in musculature of the pelvis and left thigh. No CT correlate in any of these locations on today's noncontrast CT imaging. Nevertheless, imaging features concerning for metastatic disease. Repeat imaging with IV contrast could be used to further evaluate as clinically warranted. 2. 10 mm anterior right upper lobe pulmonary nodule shows low level hypermetabolism. Additional tiny pulmonary nodules are too small to characterize on PET imaging. 3.  Aortic Atherosclerois (ICD10-170.0) Electronically Signed   By: Misty Stanley M.D.   On: 09/25/2020 14:49    Medications: I have reviewed the patient's current medications.   Assessment/Plan: 1.Multiple brain metastases, unknown primary tumor site  CT brain 09/14/2020-multiple hemorrhagic metastases with surrounding edema in the bilateral cerebral hemispheres  CTs chest, abdomen, pelvis 09/14/2020-1.4 cm right upper lobe subpleural nodule, 0.5 cm right lower lobe nodule, multiple hypodensities in the liver suspicious for metastases, 1.5 cm right upper pole hypodense kidney lesion-indeterminate  MRI abdomen 09/15/2020-multifocal T1 hyperintense liver lesions, no kidney mass  Ultrasound-guided biopsy of a segment 4B liver lesion 09/18/2020-benign liver parenchyma with steatosis, no evidence of malignancy  PET 09/25/2020- focal area of hypermetabolism in the right hilum, multiple areas of the liver, pelvic musculature, and left thigh-no CT correlate with any of these areas on the noncontrast CT, 10 mm anterior right upper lobe nodule with  low-level hypermetabolism, additional tiny pulmonary nodules too small to characterize by PET  SRS to 10 brain lesions 09/27/2020    2.Left-sided weakness, new onset seizure secondary to #1 3.Hyperlipidemia 4.Elevated liver enzymes and bilirubin  Liver enzymes more elevated on repeat labs 09/27/2020 5.Headache and nausea/vomiting secondary to #1 6.Small flat brown mole at the plantar surface of the right foot   Disposition: [name] appears stable.  He tolerated the SRS well.  His overall clinical status has improved since starting Decadron.  He appears to have a metastatic carcinoma and there is no apparent primary tumor site.  I reviewed the PET images with him.  There is no lesion that appears easily accessible for a biopsy.  He is scheduled for a diagnostic/therapeutic brain metastectomy procedure tomorrow.  I will follow-up on the pathology and see him after surgery.  The etiology of the elevated liver enzymes is unclear.  He has a history of significant alcohol use in the past and appears to have liver metastases.  The liver enzymes appear to have been elevated prior to starting Keppra.  I cannot explain the rise in the liver enzymes over the past 2 weeks.  He will return to the lab for a repeat chemistry panel today.  He will return for an office visit on 10/05/2020.  Betsy Coder, MD  09/28/2020  3:16 PM

## 2020-09-28 NOTE — Telephone Encounter (Signed)
Scheduled appointment per 1/13 los. Spoke to patient's wife who is aware of appointment date and time.

## 2020-09-29 ENCOUNTER — Other Ambulatory Visit: Payer: Self-pay

## 2020-09-29 ENCOUNTER — Inpatient Hospital Stay (HOSPITAL_COMMUNITY): Payer: 59 | Admitting: Vascular Surgery

## 2020-09-29 ENCOUNTER — Encounter (HOSPITAL_COMMUNITY): Payer: Self-pay | Admitting: Neurological Surgery

## 2020-09-29 ENCOUNTER — Inpatient Hospital Stay (HOSPITAL_COMMUNITY): Payer: 59 | Admitting: Anesthesiology

## 2020-09-29 ENCOUNTER — Inpatient Hospital Stay (HOSPITAL_COMMUNITY)
Admission: RE | Disposition: A | Discharge status: Home / Self Care | Payer: Self-pay | Source: Home / Self Care | Attending: Neurological Surgery

## 2020-09-29 ENCOUNTER — Inpatient Hospital Stay (HOSPITAL_COMMUNITY)
Admission: RE | Admit: 2020-09-29 | Discharge: 2020-09-30 | DRG: 026 | Disposition: A | Discharge status: Home / Self Care | Payer: 59 | Attending: Neurological Surgery | Admitting: Neurological Surgery

## 2020-09-29 DIAGNOSIS — K760 Fatty (change of) liver, not elsewhere classified: Secondary | ICD-10-CM | POA: Diagnosis present

## 2020-09-29 DIAGNOSIS — Z87891 Personal history of nicotine dependence: Secondary | ICD-10-CM

## 2020-09-29 DIAGNOSIS — C787 Secondary malignant neoplasm of liver and intrahepatic bile duct: Secondary | ICD-10-CM | POA: Diagnosis present

## 2020-09-29 DIAGNOSIS — Z9889 Other specified postprocedural states: Secondary | ICD-10-CM

## 2020-09-29 DIAGNOSIS — Z79899 Other long term (current) drug therapy: Secondary | ICD-10-CM | POA: Diagnosis not present

## 2020-09-29 DIAGNOSIS — E785 Hyperlipidemia, unspecified: Secondary | ICD-10-CM | POA: Diagnosis present

## 2020-09-29 DIAGNOSIS — C801 Malignant (primary) neoplasm, unspecified: Secondary | ICD-10-CM | POA: Diagnosis present

## 2020-09-29 DIAGNOSIS — Z20822 Contact with and (suspected) exposure to covid-19: Secondary | ICD-10-CM | POA: Diagnosis present

## 2020-09-29 DIAGNOSIS — Z7952 Long term (current) use of systemic steroids: Secondary | ICD-10-CM

## 2020-09-29 DIAGNOSIS — C7931 Secondary malignant neoplasm of brain: Principal | ICD-10-CM | POA: Diagnosis present

## 2020-09-29 DIAGNOSIS — D496 Neoplasm of unspecified behavior of brain: Secondary | ICD-10-CM | POA: Diagnosis present

## 2020-09-29 HISTORY — PX: APPLICATION OF CRANIAL NAVIGATION: SHX6578

## 2020-09-29 HISTORY — PX: CRANIOTOMY: SHX93

## 2020-09-29 LAB — PLATELET COUNT: Platelets: 307 10*3/uL (ref 150–400)

## 2020-09-29 LAB — ABO/RH: ABO/RH(D): O POS

## 2020-09-29 LAB — PROTIME-INR
INR: 0.9 (ref 0.8–1.2)
Prothrombin Time: 12.2 seconds (ref 11.4–15.2)

## 2020-09-29 LAB — APTT: aPTT: 25 seconds (ref 24–36)

## 2020-09-29 SURGERY — CRANIOTOMY TUMOR EXCISION
Anesthesia: General | Site: Head | Laterality: Left

## 2020-09-29 MED ORDER — LIDOCAINE-EPINEPHRINE 1 %-1:100000 IJ SOLN
INTRAMUSCULAR | Status: DC | PRN
  Administered 2020-09-29: 2 mL
  Administered 2020-09-29: 5 mL

## 2020-09-29 MED ORDER — SODIUM CHLORIDE 0.9 % IV SOLN: INTRAVENOUS | Status: DC | PRN

## 2020-09-29 MED ORDER — LEVETIRACETAM 500 MG PO TABS
500.0000 mg | ORAL_TABLET | Freq: Two times a day (BID) | ORAL | Status: DC
  Administered 2020-09-29 – 2020-09-30 (×2): 500 mg via ORAL
  Filled 2020-09-29 (×2): qty 1

## 2020-09-29 MED ORDER — HEPARIN SODIUM (PORCINE) 5000 UNIT/ML IJ SOLN: 5000.0000 [IU] | Freq: Three times a day (TID) | INTRAMUSCULAR | Status: DC

## 2020-09-29 MED ORDER — PROPOFOL 10 MG/ML IV BOLUS
INTRAVENOUS | Status: DC | PRN
  Administered 2020-09-29: 50 mg via INTRAVENOUS
  Administered 2020-09-29: 150 mg via INTRAVENOUS

## 2020-09-29 MED ORDER — ACETAMINOPHEN 650 MG RE SUPP: 650.0000 mg | RECTAL | Status: DC | PRN

## 2020-09-29 MED ORDER — ROCURONIUM BROMIDE 10 MG/ML (PF) SYRINGE
PREFILLED_SYRINGE | INTRAVENOUS | Status: AC
  Filled 2020-09-29: qty 30

## 2020-09-29 MED ORDER — THROMBIN 5000 UNITS EX SOLR
OROMUCOSAL | Status: DC | PRN
  Administered 2020-09-29 (×2): 5 mL via TOPICAL

## 2020-09-29 MED ORDER — HYDROMORPHONE HCL 1 MG/ML IJ SOLN
0.5000 mg | INTRAMUSCULAR | Status: DC | PRN
  Administered 2020-09-29 – 2020-09-30 (×5): 0.5 mg via INTRAVENOUS
  Filled 2020-09-29 (×5): qty 1

## 2020-09-29 MED ORDER — LIDOCAINE-EPINEPHRINE 1 %-1:100000 IJ SOLN
INTRAMUSCULAR | Status: AC
  Filled 2020-09-29: qty 1

## 2020-09-29 MED ORDER — THROMBIN 5000 UNITS EX SOLR
CUTANEOUS | Status: AC
  Filled 2020-09-29: qty 5000

## 2020-09-29 MED ORDER — HYDROCODONE-ACETAMINOPHEN 5-325 MG PO TABS
1.0000 | ORAL_TABLET | ORAL | Status: DC | PRN
  Administered 2020-09-29 – 2020-09-30 (×5): 1 via ORAL
  Filled 2020-09-29 (×5): qty 1

## 2020-09-29 MED ORDER — DEXAMETHASONE SODIUM PHOSPHATE 10 MG/ML IJ SOLN
INTRAMUSCULAR | Status: DC | PRN
  Administered 2020-09-29: 10 mg via INTRAVENOUS

## 2020-09-29 MED ORDER — CHLORHEXIDINE GLUCONATE 0.12 % MT SOLN
15.0000 mL | Freq: Once | OROMUCOSAL | Status: AC
  Administered 2020-09-29: 15 mL via OROMUCOSAL
  Filled 2020-09-29: qty 15

## 2020-09-29 MED ORDER — LABETALOL HCL 5 MG/ML IV SOLN
10.0000 mg | INTRAVENOUS | Status: DC | PRN
  Administered 2020-09-29: 20 mg via INTRAVENOUS
  Filled 2020-09-29: qty 4

## 2020-09-29 MED ORDER — FENTANYL CITRATE (PF) 250 MCG/5ML IJ SOLN
INTRAMUSCULAR | Status: AC
  Filled 2020-09-29: qty 5

## 2020-09-29 MED ORDER — CHLORHEXIDINE GLUCONATE CLOTH 2 % EX PADS: 6.0000 | MEDICATED_PAD | Freq: Once | CUTANEOUS | Status: DC

## 2020-09-29 MED ORDER — ONDANSETRON HCL 4 MG PO TABS: 4.0000 mg | ORAL_TABLET | ORAL | Status: DC | PRN

## 2020-09-29 MED ORDER — DOCUSATE SODIUM 100 MG PO CAPS
100.0000 mg | ORAL_CAPSULE | Freq: Two times a day (BID) | ORAL | Status: DC
  Administered 2020-09-29 (×2): 100 mg via ORAL
  Filled 2020-09-29 (×2): qty 1

## 2020-09-29 MED ORDER — BACITRACIN ZINC 500 UNIT/GM EX OINT
TOPICAL_OINTMENT | CUTANEOUS | Status: DC | PRN
  Administered 2020-09-29: 1 via TOPICAL

## 2020-09-29 MED ORDER — ONDANSETRON HCL 4 MG/2ML IJ SOLN
INTRAMUSCULAR | Status: DC | PRN
  Administered 2020-09-29: 4 mg via INTRAVENOUS

## 2020-09-29 MED ORDER — LEVETIRACETAM IN NACL 500 MG/100ML IV SOLN
500.0000 mg | INTRAVENOUS | Status: DC
  Filled 2020-09-29: qty 100

## 2020-09-29 MED ORDER — THROMBIN 20000 UNITS EX SOLR
CUTANEOUS | Status: DC | PRN
  Administered 2020-09-29: 20 mL via TOPICAL

## 2020-09-29 MED ORDER — SUGAMMADEX SODIUM 200 MG/2ML IV SOLN
INTRAVENOUS | Status: DC | PRN
  Administered 2020-09-29: 200 mg via INTRAVENOUS

## 2020-09-29 MED ORDER — ONDANSETRON HCL 4 MG/2ML IJ SOLN: 4.0000 mg | INTRAMUSCULAR | Status: DC | PRN

## 2020-09-29 MED ORDER — ACETAMINOPHEN 325 MG PO TABS
650.0000 mg | ORAL_TABLET | ORAL | Status: DC | PRN
  Administered 2020-09-30: 650 mg via ORAL
  Filled 2020-09-29: qty 2

## 2020-09-29 MED ORDER — FENTANYL CITRATE (PF) 250 MCG/5ML IJ SOLN
INTRAMUSCULAR | Status: DC | PRN
  Administered 2020-09-29: 50 ug via INTRAVENOUS
  Administered 2020-09-29 (×3): 100 ug via INTRAVENOUS
  Administered 2020-09-29: 150 ug via INTRAVENOUS

## 2020-09-29 MED ORDER — PANTOPRAZOLE SODIUM 40 MG PO TBEC
40.0000 mg | DELAYED_RELEASE_TABLET | Freq: Every day | ORAL | Status: DC | PRN
Start: 2020-09-29 — End: 2020-09-30

## 2020-09-29 MED ORDER — BACITRACIN ZINC 500 UNIT/GM EX OINT
TOPICAL_OINTMENT | CUTANEOUS | Status: AC
  Filled 2020-09-29: qty 28.35

## 2020-09-29 MED ORDER — LACTATED RINGERS IV SOLN: INTRAVENOUS | Status: DC

## 2020-09-29 MED ORDER — MIDAZOLAM HCL 5 MG/5ML IJ SOLN
INTRAMUSCULAR | Status: DC | PRN
  Administered 2020-09-29: 2 mg via INTRAVENOUS

## 2020-09-29 MED ORDER — PHENYLEPHRINE HCL-NACL 10-0.9 MG/250ML-% IV SOLN
INTRAVENOUS | Status: DC | PRN
  Administered 2020-09-29: 20 ug/min via INTRAVENOUS

## 2020-09-29 MED ORDER — MIDAZOLAM HCL 2 MG/2ML IJ SOLN
INTRAMUSCULAR | Status: AC
  Filled 2020-09-29: qty 2

## 2020-09-29 MED ORDER — ONDANSETRON HCL 4 MG/2ML IJ SOLN
INTRAMUSCULAR | Status: AC
  Filled 2020-09-29: qty 8

## 2020-09-29 MED ORDER — PROMETHAZINE HCL 12.5 MG PO TABS
12.5000 mg | ORAL_TABLET | ORAL | Status: DC | PRN
Start: 2020-09-29 — End: 2020-09-30
  Filled 2020-09-29: qty 2

## 2020-09-29 MED ORDER — POLYETHYLENE GLYCOL 3350 17 G PO PACK: 17.0000 g | PACK | Freq: Every day | ORAL | Status: DC | PRN

## 2020-09-29 MED ORDER — SODIUM CHLORIDE 0.9 % IV SOLN
INTRAVENOUS | Status: DC | PRN
  Administered 2020-09-29: 500 mg via INTRAVENOUS

## 2020-09-29 MED ORDER — 0.9 % SODIUM CHLORIDE (POUR BTL) OPTIME
TOPICAL | Status: DC | PRN
  Administered 2020-09-29 (×3): 1000 mL

## 2020-09-29 MED ORDER — MICROFIBRILLAR COLL HEMOSTAT EX PADS
MEDICATED_PAD | CUTANEOUS | Status: DC | PRN
  Administered 2020-09-29: 1 via TOPICAL

## 2020-09-29 MED ORDER — DEXAMETHASONE SODIUM PHOSPHATE 10 MG/ML IJ SOLN
INTRAMUSCULAR | Status: AC
  Filled 2020-09-29: qty 4

## 2020-09-29 MED ORDER — ADULT MULTIVITAMIN W/MINERALS CH
1.0000 | ORAL_TABLET | Freq: Every day | ORAL | Status: DC
  Administered 2020-09-29 – 2020-09-30 (×2): 1 via ORAL
  Filled 2020-09-29 (×2): qty 1

## 2020-09-29 MED ORDER — ROCURONIUM BROMIDE 10 MG/ML (PF) SYRINGE
PREFILLED_SYRINGE | INTRAVENOUS | Status: DC | PRN
  Administered 2020-09-29: 100 mg via INTRAVENOUS
  Administered 2020-09-29: 50 mg via INTRAVENOUS

## 2020-09-29 MED ORDER — ORAL CARE MOUTH RINSE: 15.0000 mL | Freq: Once | OROMUCOSAL | Status: AC

## 2020-09-29 MED ORDER — THROMBIN 20000 UNITS EX SOLR
CUTANEOUS | Status: AC
  Filled 2020-09-29: qty 20000

## 2020-09-29 MED ORDER — CEFAZOLIN SODIUM-DEXTROSE 2-4 GM/100ML-% IV SOLN
2.0000 g | Freq: Three times a day (TID) | INTRAVENOUS | Status: AC
  Administered 2020-09-29 – 2020-09-30 (×2): 2 g via INTRAVENOUS
  Filled 2020-09-29 (×3): qty 100

## 2020-09-29 MED ORDER — CEFAZOLIN SODIUM-DEXTROSE 2-4 GM/100ML-% IV SOLN
2.0000 g | INTRAVENOUS | Status: AC
  Administered 2020-09-29: 2 g via INTRAVENOUS
  Filled 2020-09-29: qty 100

## 2020-09-29 MED ORDER — PHENYLEPHRINE 40 MCG/ML (10ML) SYRINGE FOR IV PUSH (FOR BLOOD PRESSURE SUPPORT)
PREFILLED_SYRINGE | INTRAVENOUS | Status: DC | PRN
  Administered 2020-09-29: 80 ug via INTRAVENOUS

## 2020-09-29 SURGICAL SUPPLY — 103 items
ADH SKN CLS APL DERMABOND .7 (GAUZE/BANDAGES/DRESSINGS) ×2
APL SKNCLS STERI-STRIP NONHPOA (GAUZE/BANDAGES/DRESSINGS)
BAND INSRT 18 STRL LF DISP RB (MISCELLANEOUS) ×4
BAND RUBBER #18 3X1/16 STRL (MISCELLANEOUS) ×2 IMPLANT
BENZOIN TINCTURE PRP APPL 2/3 (GAUZE/BANDAGES/DRESSINGS) IMPLANT
BLADE CLIPPER SURG (BLADE) ×3 IMPLANT
BLADE SAW GIGLI 16 STRL (MISCELLANEOUS) IMPLANT
BLADE SURG 15 STRL LF DISP TIS (BLADE) IMPLANT
BLADE SURG 15 STRL SS (BLADE)
BNDG CMPR 75X41 PLY HI ABS (GAUZE/BANDAGES/DRESSINGS)
BNDG GAUZE ELAST 4 BULKY (GAUZE/BANDAGES/DRESSINGS) IMPLANT
BNDG STRETCH 4X75 STRL LF (GAUZE/BANDAGES/DRESSINGS) IMPLANT
BUR ACORN 6.0 (BURR) ×1 IMPLANT
BUR ACORN 9.0 PRECISION (BURR) ×3 IMPLANT
BUR ROUND FLUTED 4 SOFT TCH (BURR) IMPLANT
BUR SPIRAL ROUTER 2.3 (BUR) ×3 IMPLANT
CANISTER SUCT 3000ML PPV (MISCELLANEOUS) ×6 IMPLANT
CATH VENTRIC 35X38 W/TROCAR LG (CATHETERS) IMPLANT
CLIP VESOCCLUDE MED 6/CT (CLIP) IMPLANT
CNTNR URN SCR LID CUP LEK RST (MISCELLANEOUS) ×2 IMPLANT
CONT SPEC 4OZ STRL OR WHT (MISCELLANEOUS) ×9
COVER BURR HOLE 7 (Orthopedic Implant) ×1 IMPLANT
COVER MAYO STAND STRL (DRAPES) IMPLANT
COVER WAND RF STERILE (DRAPES) ×2 IMPLANT
DECANTER SPIKE VIAL GLASS SM (MISCELLANEOUS) ×3 IMPLANT
DERMABOND ADVANCED (GAUZE/BANDAGES/DRESSINGS) ×1
DERMABOND ADVANCED .7 DNX12 (GAUZE/BANDAGES/DRESSINGS) IMPLANT
DRAIN SUBARACHNOID (WOUND CARE) IMPLANT
DRAPE HALF SHEET 40X57 (DRAPES) ×4 IMPLANT
DRAPE MICROSCOPE LEICA (MISCELLANEOUS) ×1 IMPLANT
DRAPE NEUROLOGICAL W/INCISE (DRAPES) ×4 IMPLANT
DRAPE STERI IOBAN 125X83 (DRAPES) IMPLANT
DRAPE SURG 17X23 STRL (DRAPES) IMPLANT
DRAPE WARM FLUID 44X44 (DRAPES) ×3 IMPLANT
DRSG ADAPTIC 3X8 NADH LF (GAUZE/BANDAGES/DRESSINGS) IMPLANT
DRSG TELFA 3X8 NADH (GAUZE/BANDAGES/DRESSINGS) IMPLANT
DURAPREP 6ML APPLICATOR 50/CS (WOUND CARE) ×4 IMPLANT
ELECT REM PT RETURN 9FT ADLT (ELECTROSURGICAL) ×3
ELECTRODE REM PT RTRN 9FT ADLT (ELECTROSURGICAL) ×2 IMPLANT
EVACUATOR 1/8 PVC DRAIN (DRAIN) IMPLANT
EVACUATOR SILICONE 100CC (DRAIN) IMPLANT
FORCEPS BIPOLAR SPETZLER 8 1.0 (NEUROSURGERY SUPPLIES) ×3 IMPLANT
GAUZE 4X4 16PLY RFD (DISPOSABLE) IMPLANT
GAUZE SPONGE 4X4 12PLY STRL (GAUZE/BANDAGES/DRESSINGS) IMPLANT
GLOVE BIO SURGEON STRL SZ 6.5 (GLOVE) ×1 IMPLANT
GLOVE BIO SURGEON STRL SZ7 (GLOVE) IMPLANT
GLOVE BIO SURGEON STRL SZ7.5 (GLOVE) ×3 IMPLANT
GLOVE BIOGEL PI IND STRL 7.0 (GLOVE) IMPLANT
GLOVE BIOGEL PI IND STRL 7.5 (GLOVE) ×2 IMPLANT
GLOVE BIOGEL PI INDICATOR 7.0 (GLOVE)
GLOVE BIOGEL PI INDICATOR 7.5 (GLOVE) ×3
GLOVE ECLIPSE 7.5 STRL STRAW (GLOVE) ×2 IMPLANT
GLOVE EXAM NITRILE LRG STRL (GLOVE) IMPLANT
GLOVE EXAM NITRILE XS STR PU (GLOVE) IMPLANT
GLOVE SURG SS PI 7.0 STRL IVOR (GLOVE) ×2 IMPLANT
GLOVE SURG UNDER POLY LF SZ6.5 (GLOVE) ×1 IMPLANT
GOWN STRL REUS W/ TWL LRG LVL3 (GOWN DISPOSABLE) ×4 IMPLANT
GOWN STRL REUS W/ TWL XL LVL3 (GOWN DISPOSABLE) IMPLANT
GOWN STRL REUS W/TWL 2XL LVL3 (GOWN DISPOSABLE) IMPLANT
GOWN STRL REUS W/TWL LRG LVL3 (GOWN DISPOSABLE) ×9
GOWN STRL REUS W/TWL XL LVL3 (GOWN DISPOSABLE) ×6
HEMOSTAT POWDER KIT SURGIFOAM (HEMOSTASIS) ×4 IMPLANT
HEMOSTAT SURGICEL 2X14 (HEMOSTASIS) ×3 IMPLANT
IV NS 1000ML (IV SOLUTION) ×3
IV NS 1000ML BAXH (IV SOLUTION) ×2 IMPLANT
KIT BASIN OR (CUSTOM PROCEDURE TRAY) ×3 IMPLANT
KIT DRAIN CSF ACCUDRAIN (MISCELLANEOUS) IMPLANT
KIT TURNOVER KIT B (KITS) ×3 IMPLANT
MARKER SPHERE PSV REFLC 13MM (MARKER) ×11 IMPLANT
NDL SPNL 18GX3.5 QUINCKE PK (NEEDLE) IMPLANT
NEEDLE HYPO 22GX1.5 SAFETY (NEEDLE) ×3 IMPLANT
NEEDLE SPNL 18GX3.5 QUINCKE PK (NEEDLE) IMPLANT
NS IRRIG 1000ML POUR BTL (IV SOLUTION) ×9 IMPLANT
PACK CRANIOTOMY CUSTOM (CUSTOM PROCEDURE TRAY) ×4 IMPLANT
PAD DRESSING TELFA 3X8 NADH (GAUZE/BANDAGES/DRESSINGS) IMPLANT
PATTIES SURGICAL .25X.25 (GAUZE/BANDAGES/DRESSINGS) IMPLANT
PATTIES SURGICAL .5 X.5 (GAUZE/BANDAGES/DRESSINGS) IMPLANT
PATTIES SURGICAL .5 X3 (DISPOSABLE) IMPLANT
PATTIES SURGICAL 1/4 X 3 (GAUZE/BANDAGES/DRESSINGS) IMPLANT
PATTIES SURGICAL 1X1 (DISPOSABLE) IMPLANT
PIN MAYFIELD SKULL DISP (PIN) ×3 IMPLANT
PLATE BONE 12 2H TARGET XL (Plate) ×2 IMPLANT
PLATE CMF SML 4H (Plate) ×1 IMPLANT
PLATE DOUBLE Y CMF 6H (Plate) ×2 IMPLANT
SCREW UNIII AXS SD 1.5X4 (Screw) ×20 IMPLANT
SPECIMEN JAR SMALL (MISCELLANEOUS) IMPLANT
SPONGE NEURO XRAY DETECT 1X3 (DISPOSABLE) IMPLANT
SPONGE SURGIFOAM ABS GEL 100 (HEMOSTASIS) ×3 IMPLANT
STAPLER VISISTAT 35W (STAPLE) ×3 IMPLANT
SUT ETHILON 3 0 FSL (SUTURE) IMPLANT
SUT ETHILON 3 0 PS 1 (SUTURE) IMPLANT
SUT MNCRL AB 3-0 PS2 18 (SUTURE) ×1 IMPLANT
SUT MON AB 3-0 SH 27 (SUTURE) ×3
SUT MON AB 3-0 SH27 (SUTURE) IMPLANT
SUT NURALON 4 0 TR CR/8 (SUTURE) ×9 IMPLANT
SUT SILK 0 TIES 10X30 (SUTURE) IMPLANT
SUT VIC AB 2-0 CP2 18 (SUTURE) ×4 IMPLANT
TOWEL GREEN STERILE (TOWEL DISPOSABLE) ×3 IMPLANT
TOWEL GREEN STERILE FF (TOWEL DISPOSABLE) ×3 IMPLANT
TRAY FOLEY MTR SLVR 16FR STAT (SET/KITS/TRAYS/PACK) ×3 IMPLANT
TUBE CONNECTING 12X1/4 (SUCTIONS) ×4 IMPLANT
UNDERPAD 30X36 HEAVY ABSORB (UNDERPADS AND DIAPERS) ×2 IMPLANT
WATER STERILE IRR 1000ML POUR (IV SOLUTION) ×3 IMPLANT

## 2020-09-29 NOTE — Anesthesia Procedure Notes (Signed)
Arterial Line Insertion Start/End1/14/2022 10:30 AM, 09/29/2020 10:30 AM Performed by: CRNA  Patient location: Pre-op. Preanesthetic checklist: patient identified, IV checked, site marked, risks and benefits discussed, surgical consent, monitors and equipment checked, pre-op evaluation, timeout performed and anesthesia consent Lidocaine 1% used for infiltration Left, radial was placed Catheter size: 20 G Hand hygiene performed , maximum sterile barriers used  and Seldinger technique used Allen's test indicative of satisfactory collateral circulation Attempts: 1 Procedure performed without using ultrasound guided technique. Following insertion, dressing applied and Biopatch. Post procedure assessment: normal  Patient tolerated the procedure well with no immediate complications.

## 2020-09-29 NOTE — Anesthesia Postprocedure Evaluation (Signed)
Anesthesia Post Note  Patient: Sean [name]  Procedure(s) Performed: Left craniotomy for tumor resection with brainlab (Left Head) APPLICATION OF CRANIAL NAVIGATION (Head)     Patient location during evaluation: PACU Anesthesia Type: General Level of consciousness: awake and alert Pain management: pain level controlled Vital Signs Assessment: post-procedure vital signs reviewed and stable Respiratory status: spontaneous breathing, nonlabored ventilation and respiratory function stable Cardiovascular status: blood pressure returned to baseline and stable Postop Assessment: no apparent nausea or vomiting Anesthetic complications: no   No complications documented.  Last Vitals:  Vitals:   09/29/20 1445 09/29/20 1500  BP:    Pulse: 63 98  Resp: 17 16  Temp:    SpO2: 97% 98%    Last Pain:  Vitals:   09/29/20 1430  TempSrc:   PainSc: 0-No pain                 Kyria Bumgardner,W. EDMOND

## 2020-09-29 NOTE — H&P (Signed)
Surgical H&P Update  HPI: 55 y.o. man with newly diagnosed intracranial metastatic disease with unknown primary, s/p preop SRS. Vision is subjectively improved with the steroids, he stopped taking them after a few days due to what sounds like some psychomotor stimulation. No changes in health since he was last seen.   PMHx:  Past Medical History:  Diagnosis Date  . Brain lesion 09/14/2020  . Hyperlipidemia   . Liver lesion 09/14/2020   FamHx:  Family History  Problem Relation Age of Onset  . Osteoarthritis Mother   . Hypertension Father   . Colon polyps Father   . Colon polyps Brother   . Colon polyps Brother   . Colon cancer Neg Hx   . Stomach cancer Neg Hx   . Rectal cancer Neg Hx   . Esophageal cancer Neg Hx   . Liver cancer Neg Hx    SocHx:  reports that he has quit smoking. He has never used smokeless tobacco. He reports current alcohol use. He reports that he does not use drugs.  Physical Exam: AOx3, PERRL, FS, TM, VF with partial R hemianopsia otherwise full  Strength 5/5 x4, SILTx4  Assesment/Plan: 55 y.o. man with intracranial metastatic disease and unknown primary, here for left craniotomy and resection of the two largest metastases. Risks, benefits, and alternatives discussed and the patient would like to continue with surgery.  -OR today -4N post-op  Judith Part, MD 09/29/20 10:26 AM

## 2020-09-29 NOTE — Transfer of Care (Signed)
Immediate Anesthesia Transfer of Care Note  Patient: Sean [name]  Procedure(s) Performed: Left craniotomy for tumor resection with brainlab (Left Head) APPLICATION OF CRANIAL NAVIGATION (Head)  Patient Location: PACU  Anesthesia Type:General  Level of Consciousness: awake and patient cooperative  Airway & Oxygen Therapy: Patient Spontanous Breathing and Patient connected to face mask oxygen  Post-op Assessment: Report given to RN and Post -op Vital signs reviewed and stable  Post vital signs: Reviewed and stable  Last Vitals:  Vitals Value Taken Time  BP 142/81 09/29/20 1420  Temp    Pulse 101 09/29/20 1427  Resp 14 09/29/20 1427  SpO2 88 % 09/29/20 1427  Vitals shown include unvalidated device data.  Last Pain:  Vitals:   09/29/20 0954  TempSrc:   PainSc: 0-No pain      Patients Stated Pain Goal: 4 (81/82/99 3716)  Complications: No complications documented.

## 2020-09-29 NOTE — Brief Op Note (Signed)
09/29/2020  2:17 PM  PATIENT:  Isidore Moos  55 y.o. male  PRE-OPERATIVE DIAGNOSIS:  Brain tumor  POST-OPERATIVE DIAGNOSIS:  Brain tumor  PROCEDURE:  Procedure(s): Left craniotomy for tumor resection with brainlab (Left) APPLICATION OF CRANIAL NAVIGATION  SURGEON:  Surgeon(s) and Role:    * Judith Part, MD - Primary    * Vallarie Mare, MD - Assisting  PHYSICIAN ASSISTANT:   ANESTHESIA:   general  EBL:  100 mL   BLOOD ADMINISTERED:none  DRAINS: none   LOCAL MEDICATIONS USED:  LIDOCAINE   SPECIMEN:  Source of Specimen:  Left frontal and left occipital brain tumors  DISPOSITION OF SPECIMEN:  PATHOLOGY  COUNTS:  YES  TOURNIQUET:  * No tourniquets in log *  DICTATION: .Note written in EPIC  PLAN OF CARE: Admit to inpatient   PATIENT DISPOSITION:  PACU - hemodynamically stable.   Delay start of Pharmacological VTE agent (>24hrs) due to surgical blood loss or risk of bleeding: yes

## 2020-09-29 NOTE — Progress Notes (Signed)
  Radiation Oncology         [phone] ________________________________  Name: Sean [name] MRN: [id number]  Date: 09/27/2020  DOB: Apr 28, 1966  Stereotactic Treatment Procedure Note  SPECIAL TREATMENT PROCEDURE  Outpatient    ICD-10-CM   1. Brain metastases ([place])  C79.31     3D TREATMENT PLANNING AND DOSIMETRY:  The patient's radiation plan was reviewed and approved by neurosurgery and radiation oncology prior to treatment.  It showed 3-dimensional radiation distributions overlaid onto the planning CT/MRI image set.  The [facility] for the target structures as well as the organs at risk were reviewed. The documentation of the 3D plan and dosimetry are filed in the radiation oncology EMR.  NARRATIVE:  Sean [name] was brought to the TrueBeam stereotactic radiation treatment machine and placed supine on the CT couch. The head frame was applied, and the patient was set up for stereotactic radiosurgery.  Neurosurgery was present for the set-up and delivery  SIMULATION VERIFICATION:  In the couch zero-angle position, the patient underwent Exactrac imaging using the Brainlab system with orthogonal KV images.  These were carefully aligned and repeated to confirm treatment position for each of the isocenters.  The Exactrac snap film verification was repeated at each couch angle.  SPECIAL TREATMENT PROCEDURE: Sean [name] received stereotactic radiosurgery to the following targets:  ExacTrac, max dose=127.5% 10 vmat beams 6FFF photons  PTV1RtPar30mm 18 Gy  PTV2LtFront2mm 16Gy (pre op) PTV3LtOcc35mm 16Gy (pre op  20Gy delivered to: PTV4LtOcc28mm PTV5RtPar48mm PTV6LtFront69mm PTV7LtPar69mm PTV8LtPar60mm PTV9RtOcc49mm PTV10RtCereb52mm PTV11LtTemp22mm PTV12LtPar12mm PTV13RtTemp83mm  Additional lesions beyond initial radiology report were treated due to personal review of images with radiology, identifying a total of 13 lesions.  This constitutes a special treatment procedure due to the  ablative dose delivered and the technical nature of treatment.  This highly technical modality of treatment ensures that the ablative dose is centered on the patient's tumor while sparing normal tissues from excessive dose and risk of detrimental effects.  STEREOTACTIC TREATMENT MANAGEMENT:  Following delivery, the patient was transported to nursing in stable condition and monitored for possible acute effects.  Vital signs were recorded BP (!) 139/91 (BP Location: Left Arm, Patient Position: Sitting)   Pulse 89   Temp (!) 97.3 F (36.3 C) (Temporal)   Resp 18 . The patient tolerated treatment without significant acute effects, and was discharged to home in stable condition.    PLAN: Surgical resection to follow of two dominant masses.  Follow-up in one month. ________________________________   Eppie Gibson, MD

## 2020-09-29 NOTE — Op Note (Addendum)
PATIENT: Sean [name]  DAY OF SURGERY: 09/29/20   PRE-OPERATIVE DIAGNOSIS:  Intracranial metastatic tumors   POST-OPERATIVE DIAGNOSIS:  Same   PROCEDURE:  Left occipital craniotomy for tumor resection, separate left frontal / supraorbital craniotomy for tumor resection, use of frameless stereotaxy   SURGEON:  Surgeon(s) and Role:    Judith Part, MD - Primary    Duffy Rhody, MD - Assisting   ANESTHESIA: ETGA   BRIEF HISTORY: This is a 55 year old man who presented with likely seizure, found to have metastatic disease, only easily accessible lesion was a liver met that was biopsied and non-diagnostic. Given the size of the two largest lesions and the need for pathologic diagnosis, I therefore recommended preop SRS and then resection of the largest two lesions. This was discussed with the patient as well as risks, benefits, and alternatives and wished to proceed with surgery.   OPERATIVE DETAIL: The patient was taken to the operating room and placed on the OR table in the supine position. A formal time out was performed with two patient identifiers and confirmed the operative site. Anesthesia was induced by the anesthesia team. The [place] head holder was applied to the head and a registration array was attached to the [place]. This was co-registered with the patient's preoperative imaging, the fit appeared to be acceptable.   For the first (occipital) craniotomy, a shoulder bump was placed under the left shoulder and the head was turned to the right to expose the left occiput. Using frameless stereotaxy, the operative trajectories for both craniotomies were planned and the incisions were marked. Hair over both incisions was clipped with surgical clippers both areas were then prepped and draped in a sterile fashion.   A linear incision was created in the left occipital region, retractors were placed and a craniotomy was performed in the usual fashion. The dura was opened and  flapped, centered over the lesion. Frameless stereotaxy was used as guidance to make a small corticotomy and access the lesion at its closest point to the surface. It was dissected circumferentially, resected en bloc, and sent to pathology. Hemostasis was confirmed, multiple rounds of irrigation were used, the dura was closed, the bone flap was replaced and secured with titanium plates and screws, all counts were correct, and the left occipital wound was closed in layers.  Attention was then turned to the left frontal tumor. The drapes were removed and the patient was repositioned supine without the bump, I previously pinned with both craniotomies in mind, so I did not have to re-pin. Given the low, frontal location, I found a fairly significant brow line that would accomodate an incision and therefore did a supraorbital craniotomy just above the eyebrow and just superior to the frontal sinus. The array was re-attached, the brainlab was re-registered, and the incision was marked prior to prepping and draping in a sterile fashion. A transverse incision was created in this brow crease, soft tissues were dissected, landmarks were confirmed, and a left frontal supraorbital craniotomy was then performed in the usual fashion. The dura was opened and frameless stereotaxy was used to localize a trajectory to where the lesion was closest to the surface. A small corticotomy was created and the lesion was identified, dissected circumferentially, resected en bloc, and sent to pathology. Hemostasis was confirmed with multiple rounds of irrigation, the dura was closed with suture, and the bone flap was replaced with titanium plates and screws. Special attention was taken to make sure the flap was flush  for cosmetic purposes. Counts were repeated and correct again, the left frontal incision was then closed in layers.   The [place] head holder was then removed and the patient was then returned to anesthesia for emergence. No  apparent complications at the completion of the procedure.   EBL:  17m   DRAINS: none   SPECIMENS: Left frontal brain tumor, left occipital brain tumor   TJudith Part MD 09/29/20 10:29 AM

## 2020-09-29 NOTE — Discharge Summary (Signed)
Discharge Summary  Date of Admission: 09/29/2020  Date of Discharge: 09/30/2020  Attending Physician: Emelda Brothers, MD  Hospital Course: Patient was admitted following an uncomplicated left frontal and occipital craniotomies for tumor resection. He was recovered in PACU and transferred to 4N. His post-op MRI showed GTR of the left frontal and left occipital lesions. His hospital course was uncomplicated and the patient was discharged home on 09/30/20. He will follow up in clinic with me in 2 weeks.  Neurologic exam at discharge:  AOx3, PERRL, EOMI, FS, TM, R homonymous hemianopsia Strength 5/5 x4, SILTx4  Discharge diagnosis: Metastatic brain tumor  Judith Part, MD 09/29/20 2:29 PM

## 2020-09-29 NOTE — Anesthesia Procedure Notes (Signed)
Procedure Name: Intubation Date/Time: 09/29/2020 10:59 AM Performed by: Renato Shin, CRNA Pre-anesthesia Checklist: Patient identified, Emergency Drugs available, Suction available and Patient being monitored Patient Re-evaluated:Patient Re-evaluated prior to induction Oxygen Delivery Method: Circle system utilized Preoxygenation: Pre-oxygenation with 100% oxygen Induction Type: IV induction Ventilation: Mask ventilation without difficulty and Oral airway inserted - appropriate to patient size Laryngoscope Size: Miller and 3 Grade View: Grade I Tube type: Oral Tube size: 7.5 mm Number of attempts: 1 Airway Equipment and Method: Stylet and Oral airway Placement Confirmation: ETT inserted through vocal cords under direct vision,  positive ETCO2 and breath sounds checked- equal and bilateral Secured at: 21 cm Tube secured with: Tape Dental Injury: Teeth and Oropharynx as per pre-operative assessment

## 2020-09-30 ENCOUNTER — Inpatient Hospital Stay (HOSPITAL_COMMUNITY): Payer: 59

## 2020-09-30 LAB — MRSA PCR SCREENING: MRSA by PCR: NEGATIVE

## 2020-09-30 IMAGING — MR MR HEAD WO/W CM
14 of 16 series · 40 of 48 positions shown · IV contrast (gadavist)
Comparison: Brain MRI [DATE]

CLINICAL DATA: Status post craniotomy for tumor section.

EXAM:
MRI HEAD WITHOUT AND WITH CONTRAST
TECHNIQUE: Multiplanar, multiecho pulse sequences of the brain and surrounding
structures were obtained without and with intravenous contrast.
CONTRAST:  10mL GADAVIST GADOBUTROL 1 MMOL/ML IV SOLN

[Series 5: DWI · axial · 3.0mm · 0.88mm/px · z∈[-125,+20]mm · 5 of 100 slices shown (1 of 4)]
[im 1/100]
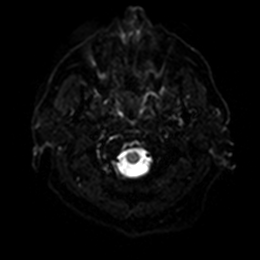
[im 25/100]
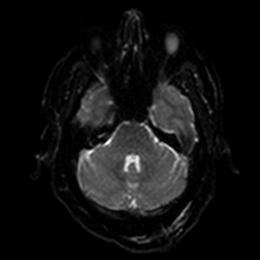
[im 50/100]
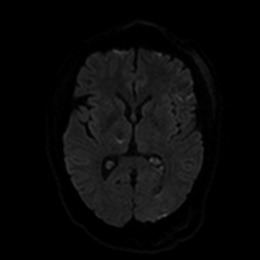
[im 75/100]
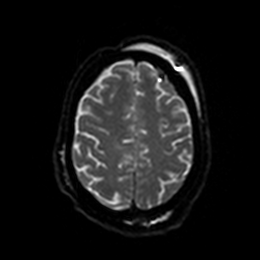
[im 100/100]
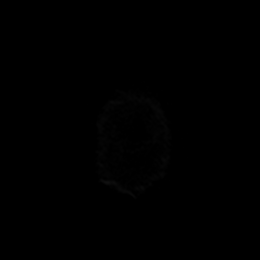

[Series 6: DWI · axial · 3.0mm · 0.88mm/px · z∈[-125,+20]mm · 2 of 50 slices shown (2 of 4)]
[im 1/50]
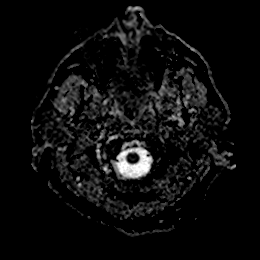
[im 50/50]
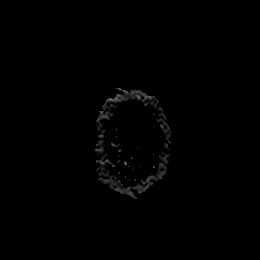

[Series 7: DWI · coronal · 4.0mm · 0.88mm/px · 4 of 68 slices shown (3 of 4)]
[im 1/68]
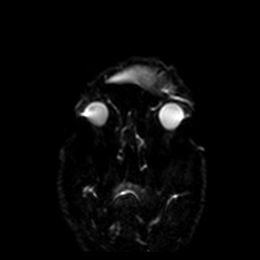
[im 23/68]
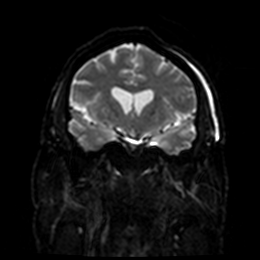
[im 45/68]
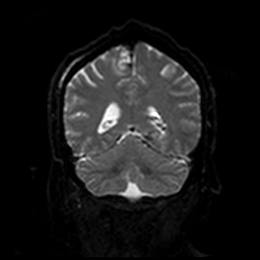
[im 68/68]
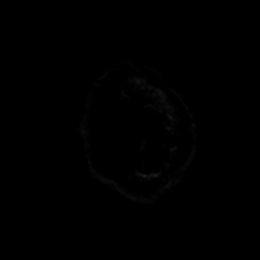

[Series 8: DWI · coronal · 4.0mm · 0.88mm/px · 2 of 34 slices shown (4 of 4)]
[im 1/34]
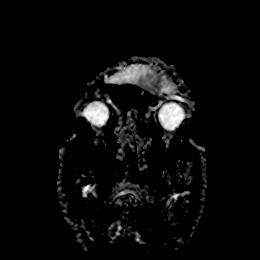
[im 34/34]
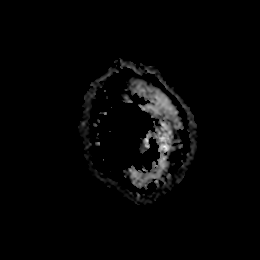

[Series 9: T1 · sagittal · 5.0mm · 0.75mm/px · 2 of 25 slices shown]
[im 1/25]
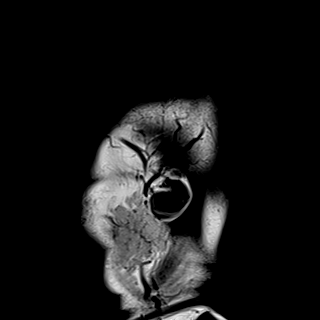
[im 25/25]
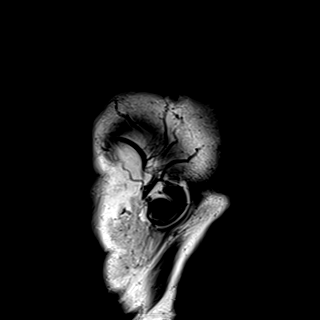

[Series 10: T2 · axial · 5.0mm · 0.72mm/px · z∈[-131,+24]mm · 2 of 27 slices shown]
[im 1/27]
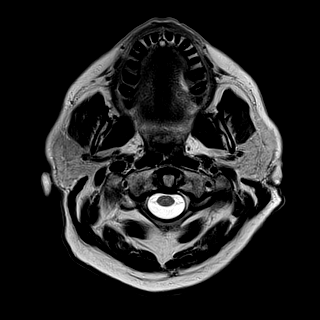
[im 27/27]
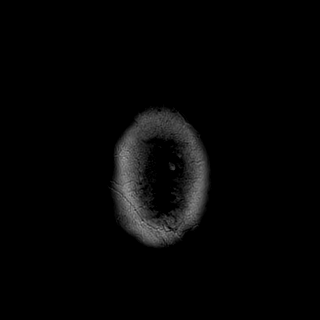

[Series 11: FLAIR · axial · 5.0mm · 0.45mm/px · z∈[-129,+25]mm · 2 of 27 slices shown]
[im 1/27]
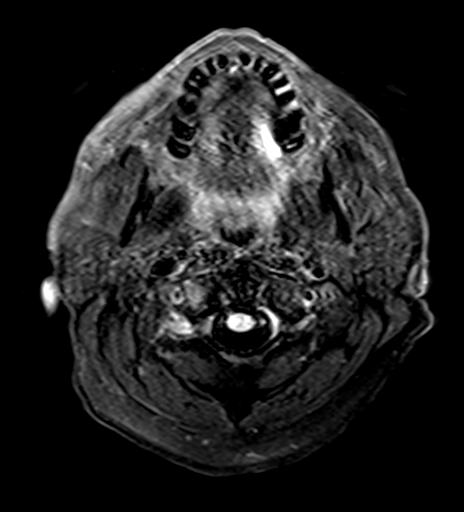
[im 27/27]
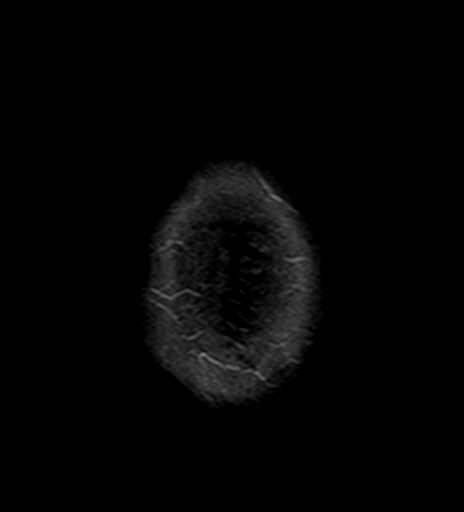

[Series 12: mag_images · axial · 3.0mm · 0.90mm/px · z∈[-140,+36]mm · 4 of 60 slices shown]
[im 1/60]
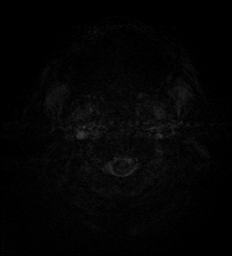
[im 20/60]
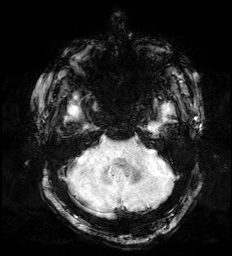
[im 40/60]
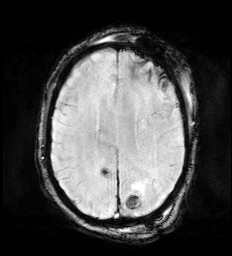
[im 60/60]
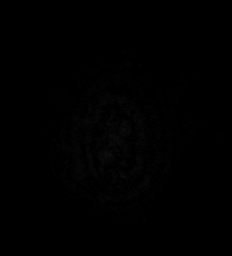

[Series 13: pha_images · axial · 3.0mm · 0.90mm/px · z∈[-140,+36]mm · 4 of 60 slices shown]
[im 1/60]
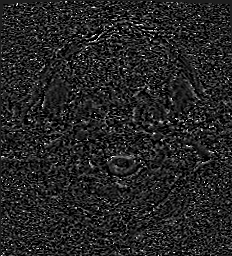
[im 20/60]
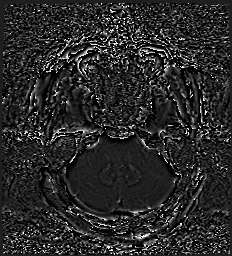
[im 40/60]
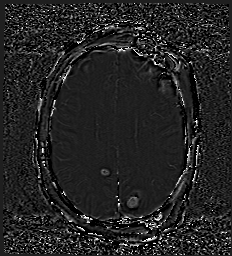
[im 60/60]
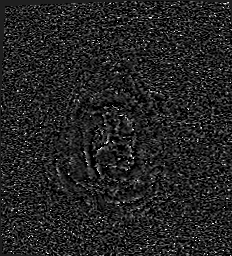

[Series 14: swi_images · axial · 3.0mm · 0.90mm/px · z∈[-140,+36]mm · 4 of 60 slices shown]
[im 1/60]
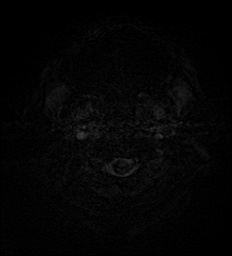
[im 20/60]
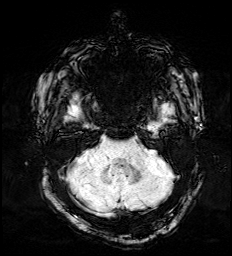
[im 40/60]
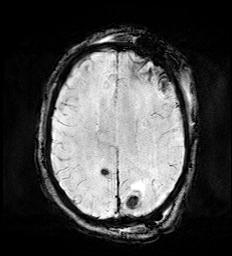
[im 60/60]
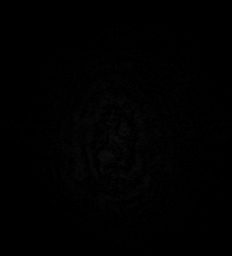

[Series 15: mip_images(sw) · axial · 24.0mm · 0.90mm/px · z∈[-129,+25]mm · 3 of 53 slices shown]
[im 1/53]
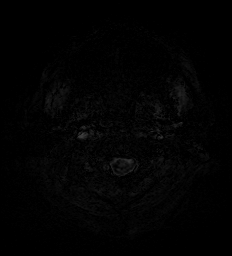
[im 27/53]
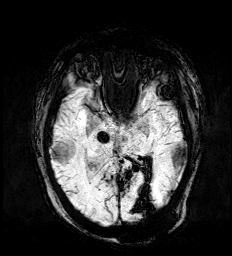
[im 53/53]
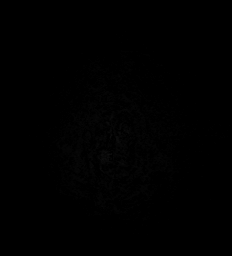

[Series 17: T2 post-contrast · coronal · 5.0mm · 0.72mm/px · 2 of 31 slices shown]
[im 1/31]
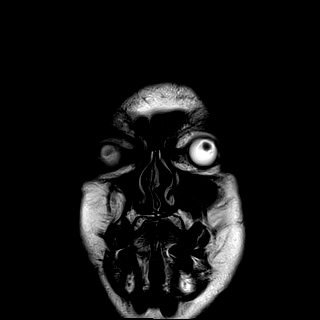
[im 31/31]
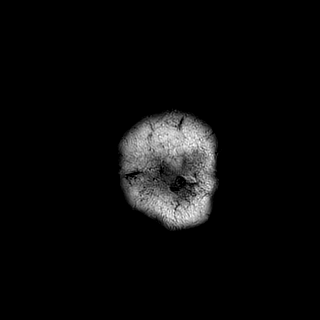

[Series 19: T1 post-contrast · coronal · 5.0mm · 0.34mm/px · 2 of 31 slices shown (1 of 2)]
[im 1/31]
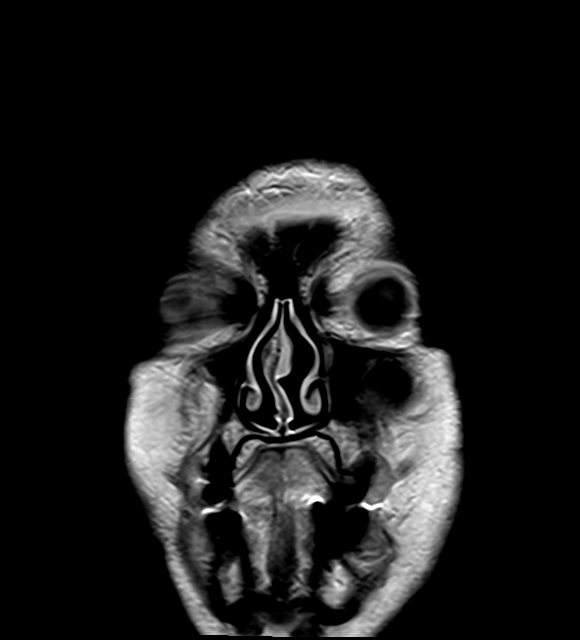
[im 31/31]
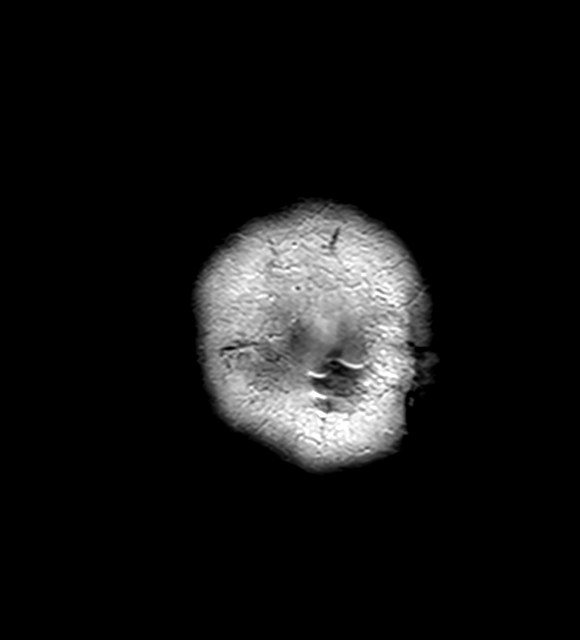

[Series 20: T1 post-contrast · sagittal · 5.0mm · 0.75mm/px · 2 of 25 slices shown (2 of 2)]
[im 1/25]
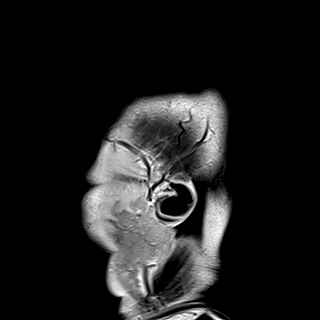
[im 25/25]
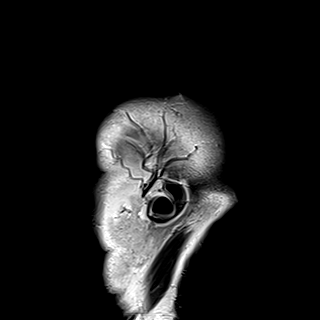

[40 of 48 positions shown; findings below may reference images not displayed]

FINDINGS: Brain: There are numerous diffusion restricting lesions throughout
both hemispheres, which have associated hemorrhage and areas of
contrast enhancement. New or increased sized lesions are as follows:

1. Right occipital lobe, 7 mm, previously 5 mm, series 18, image 24
2. Right basal ganglia, 3 mm, series 18, image 31
3. Right thalamus, 8 mm, image 34, mild surrounding edema, intrinsic
hyperintense T1-weighted signal
4. Left frontal operculum, enhancing component 5 mm surrounding area
17 mm, possibly previously present as a punctate lesion, image 35
5. Superior left occipital lobe, 14 mm, previously 10 mm, image 37,
mild surrounding edema
6. Anterior right frontal lobe 3 mm, image 37, intrinsic
hyperintense T1-weighted signal
7. Left frontal lobe, 6 mm, previously 4 mm, image 44, intrinsic
hyperintense T1-weighted signal

Unchanged or smaller lesions:

1. Superior right cerebellum, 3 mm, series 18, image 23
2. Dominant left occipital lesion has been resected. There is
minimal residual contrast enhancement adjacent to the occipital horn
of the left lateral ventricle, image 32, mild edema
3. Left frontal lobe lesion status post resection with no residual
nodular contrast enhancement, image 37, mild edema
4. Superomedial right parietal lobe, 4 mm, image 42
5. Left parietal lobe, 3 mm, image 45
6. Punctate left parietal lobe, image 43
7. Paramedian superior right frontal lobe, 14 mm, image 49, mild
edema with intrinsic hyperintense T1-weighted signal

Vascular: Normal flow voids.

Skull and upper cervical spine: Left frontal and parietal
craniotomies.

Sinuses/Orbits: Negative.

Other: None.
IMPRESSION: 1. Status post resection of left frontal and occipital lesions with
minimal residual contrast enhancement near the occipital horn of the
left lateral ventricle.
2. 7 lesions that are new or larger.
3. Remainder of lesions are unchanged or smaller.

## 2020-09-30 MED ORDER — GADOBUTROL 1 MMOL/ML IV SOLN
10.0000 mL | Freq: Once | INTRAVENOUS | Status: AC | PRN
  Administered 2020-09-30: 10 mL via INTRAVENOUS

## 2020-09-30 MED ORDER — HYDROCODONE-ACETAMINOPHEN 5-325 MG PO TABS: 1.0000 | ORAL_TABLET | ORAL | 0 refills | Status: DC | PRN

## 2020-09-30 MED ORDER — DEXAMETHASONE 2 MG PO TABS: ORAL_TABLET | ORAL | 0 refills | Status: AC

## 2020-09-30 NOTE — Discharge Instructions (Signed)
Walk as much as possible No heavy lifting >10 lbs No strenuous activity  

## 2020-09-30 NOTE — Evaluation (Signed)
Occupational Therapy Evaluation Patient Details Name: Sean [name] MRN: [id number] DOB: 1966-07-13 Today's Date: 09/30/2020    History of Present Illness Pt is a 55 yo male s/p L craniotomy tumor resection at L frontal and L occipital. PMHx: HLD, brain and liver lesion as of 12/31.   Clinical Impression   Pt PTA: Pt living with spouse and reports independence, working. Pt currently, supervisionA to modified independence for ADL and mobility. Pt using no AD for mobility. No visual deficits; pt able to maneuver around obstacles and WNLs with functional acuity test. Pt does not require continued OT skilled services. OT signing off.      Follow Up Recommendations  No OT follow up    Equipment Recommendations  None recommended by OT    Recommendations for Other Services       Precautions / Restrictions Precautions Precautions: Fall Restrictions Weight Bearing Restrictions: No      Mobility Bed Mobility Overal bed mobility: Modified Independent                  Transfers Overall transfer level: Modified independent                    Balance Overall balance assessment: No apparent balance deficits (not formally assessed)                                         ADL either performed or assessed with clinical judgement   ADL Overall ADL's : At baseline                                       General ADL Comments: Pt supervisionA to modified independence with ADL and ADL functional mobility     Vision Baseline Vision/History: No visual deficits;Wears glasses Wears Glasses: Reading only Patient Visual Report: No change from baseline Vision Assessment?: Yes;No apparent visual deficits Eye Alignment: Within Functional Limits Ocular Range of Motion: Within Functional Limits Alignment/Gaze Preference: Within Defined Limits Tracking/Visual Pursuits: Able to track stimulus in all quads without difficulty     Perception      Praxis      Pertinent Vitals/Pain Pain Assessment: Faces Faces Pain Scale: Hurts a little bit Pain Location: headache Pain Descriptors / Indicators: Aching Pain Intervention(s): Monitored during session     Hand Dominance Left   Extremity/Trunk Assessment Upper Extremity Assessment Upper Extremity Assessment: Overall WFL for tasks assessed   Lower Extremity Assessment Lower Extremity Assessment: Overall WFL for tasks assessed   Cervical / Trunk Assessment Cervical / Trunk Assessment: Normal   Communication Communication Communication: No difficulties   Cognition Arousal/Alertness: Awake/alert Behavior During Therapy: WFL for tasks assessed/performed Overall Cognitive Status: Within Functional Limits for tasks assessed                                     General Comments  Pt's spouse in room. Pt's VSS  HT 100-110 BPM with exertion; O2 >90% on RA.    Exercises     Shoulder Instructions      Home Living Family/patient expects to be discharged to:: Private residence Living Arrangements: Spouse/significant other Available Help at Discharge: Family;Available PRN/intermittently (24 hours for the next few days) Type of Home: [place]  Access: Stairs to enter [employer] of Steps: 1   Home Layout: One level     Bathroom Shower/Tub: Teacher, early years/pre: Handicapped height     Home Equipment: Grab bars - toilet   Additional Comments: 2 dogs      Prior Functioning/Environment Level of Independence: Independent        Comments: Geophysical data processor        OT Problem List:        OT Treatment/Interventions:      OT Goals(Current goals can be found in the care plan section) Acute Rehab OT Goals Patient Stated Goal: to go home and shower OT Goal Formulation: All assessment and education complete, DC therapy Potential to Achieve Goals: Good  OT Frequency:     Barriers to D/C:             Co-evaluation              AM-PAC OT "6 Clicks" Daily Activity     Outcome Measure Help from another person eating meals?: None Help from another person taking care of personal grooming?: None Help from another person toileting, which includes using toliet, bedpan, or urinal?: None Help from another person bathing (including washing, rinsing, drying)?: None Help from another person to put on and taking off regular upper body clothing?: None Help from another person to put on and taking off regular lower body clothing?: None 6 Click Score: 24   End of Session Nurse Communication: Mobility status  Activity Tolerance: Patient tolerated treatment well Patient left: in bed;with call bell/phone within reach;with family/visitor present  OT Visit Diagnosis: Unsteadiness on feet (R26.81)                Time: 1251-1316 OT Time Calculation (min): 25 min Charges:  OT General Charges $OT Visit: 1 Visit OT Evaluation $OT Eval Moderate Complexity: 1 Mod  Jefferey Pica, OTR/L Acute Rehabilitation Services Pager: [phone] Office: [phone]   Deshon Hsiao C 09/30/2020, 3:43 PM

## 2020-09-30 NOTE — Evaluation (Signed)
Physical Therapy Evaluation Patient Details Name: Sean [name] MRN: [id number] DOB: [birth year] Today's Date: 09/30/2020   History of Present Illness  Pt is a 55 yo male s/p L craniotomy tumor resection at L frontal and L occipital. PMHx: HLD, brain and liver lesion as of 12/31.  Clinical Impression  Pt is close to baseline functioning and should be safe at home with available assist. There are no further acute PT needs.  Will sign off at this time.     Follow Up Recommendations No PT follow up    Equipment Recommendations  None recommended by PT    Recommendations for Other Services       Precautions / Restrictions Precautions Precautions: Fall Restrictions Weight Bearing Restrictions: No      Mobility  Bed Mobility Overal bed mobility: Modified Independent                  Transfers Overall transfer level: Modified independent                  Ambulation/Gait Ambulation/Gait assistance: Supervision Gait Distance (Feet): 300 Feet Assistive device: None Gait Pattern/deviations: Step-through pattern   Gait velocity interpretation: >2.62 ft/sec, indicative of community ambulatory General Gait Details: steady, pt able to attain age appropriate gait speed, no deviation with challenge to balance.  Stairs Stairs: Yes Stairs assistance: Modified independent (Device/Increase time) Stair Management: One rail Left;Alternating pattern;Forwards Number of Stairs: 5 General stair comments: safe with assist of the rail  Wheelchair Mobility    Modified Rankin (Stroke Patients Only)       Balance Overall balance assessment: No apparent balance deficits (not formally assessed)                                           Pertinent Vitals/Pain Pain Assessment: Faces Faces Pain Scale: Hurts a little bit Pain Location: headache Pain Descriptors / Indicators: Aching Pain Intervention(s): Monitored during session    Home Living  Family/patient expects to be discharged to:: Private residence Living Arrangements: Spouse/significant other Available Help at Discharge: Family;Available PRN/intermittently (24 hours for the next few days) Type of Home: House Home Access: Stairs to enter   [employer] of Steps: 1 Home Layout: One level Home Equipment: Grab bars - toilet Additional Comments: 2 dogs    Prior Function Level of Independence: Independent         Comments: Education officer, museum Dominance   Dominant Hand: Left    Extremity/Trunk Assessment   Upper Extremity Assessment Upper Extremity Assessment: Overall WFL for tasks assessed    Lower Extremity Assessment Lower Extremity Assessment: Overall WFL for tasks assessed       Communication   Communication: No difficulties  Cognition Arousal/Alertness: Awake/alert Behavior During Therapy: WFL for tasks assessed/performed Overall Cognitive Status: Within Functional Limits for tasks assessed                                        General Comments      Exercises     Assessment/Plan    PT Assessment Patent does not need any further PT services  PT Problem List         PT Treatment Interventions      PT Goals (Current goals can be found  in the Care Plan section)  Acute Rehab PT Goals PT Goal Formulation: All assessment and education complete, DC therapy    Frequency     Barriers to discharge        Co-evaluation               AM-PAC PT "6 Clicks" Mobility  Outcome Measure Help needed turning from your back to your side while in a flat bed without using bedrails?: None Help needed moving from lying on your back to sitting on the side of a flat bed without using bedrails?: None Help needed moving to and from a bed to a chair (including a wheelchair)?: None Help needed standing up from a chair using your arms (e.g., wheelchair or bedside chair)?: None Help needed to walk in  hospital room?: A Little Help needed climbing 3-5 steps with a railing? : None 6 Click Score: 23    End of Session   Activity Tolerance: Patient tolerated treatment well Patient left: in bed Nurse Communication: Mobility status PT Visit Diagnosis: Other symptoms and signs involving the nervous system (R29.898)    Time: 1250-1310 PT Time Calculation (min) (ACUTE ONLY): 20 min   Charges:   PT Evaluation $PT Eval Low Complexity: 1 Low          09/30/2020  Ginger Carne., PT Acute Rehabilitation Services [phone]  (pager) [phone]  (office)  Tessie Fass Jobanny Mavis 09/30/2020, 1:54 PM

## 2020-10-01 ENCOUNTER — Encounter (HOSPITAL_COMMUNITY): Payer: Self-pay | Admitting: Neurological Surgery

## 2020-10-03 MED FILL — Thrombin For Soln 5000 Unit: CUTANEOUS | Qty: 5000 | Status: AC

## 2020-10-04 LAB — SURGICAL PATHOLOGY

## 2020-10-05 ENCOUNTER — Inpatient Hospital Stay: Payer: 59 | Admitting: Oncology

## 2020-10-05 ENCOUNTER — Other Ambulatory Visit: Payer: Self-pay

## 2020-10-05 ENCOUNTER — Inpatient Hospital Stay: Payer: 59

## 2020-10-05 VITALS — BP 150/85 | HR 91 | Temp 97.8°F | Resp 18 | Ht 71.0 in | Wt 211.3 lb

## 2020-10-05 DIAGNOSIS — H539 Unspecified visual disturbance: Secondary | ICD-10-CM | POA: Diagnosis not present

## 2020-10-05 DIAGNOSIS — Z5112 Encounter for antineoplastic immunotherapy: Secondary | ICD-10-CM | POA: Diagnosis present

## 2020-10-05 DIAGNOSIS — C7931 Secondary malignant neoplasm of brain: Secondary | ICD-10-CM | POA: Diagnosis present

## 2020-10-05 DIAGNOSIS — C439 Malignant melanoma of skin, unspecified: Secondary | ICD-10-CM | POA: Diagnosis present

## 2020-10-05 DIAGNOSIS — R569 Unspecified convulsions: Secondary | ICD-10-CM | POA: Diagnosis not present

## 2020-10-05 DIAGNOSIS — Z79899 Other long term (current) drug therapy: Secondary | ICD-10-CM | POA: Diagnosis not present

## 2020-10-05 DIAGNOSIS — R748 Abnormal levels of other serum enzymes: Secondary | ICD-10-CM | POA: Diagnosis not present

## 2020-10-05 NOTE — Progress Notes (Signed)
START ON PATHWAY REGIMEN - Melanoma and Other Skin Cancers     Cycles 1 through 4: A cycle is every 21 days:     Nivolumab      Ipilimumab    Cycles 5 and beyond: A cycle is every 14 days:     Nivolumab   **Always confirm dose/schedule in your pharmacy ordering system**  Patient Characteristics: Melanoma, Cutaneous/Unknown Primary, Distant Metastases or Unresectable Local Recurrence, Unresectable, Symptomatic, First Line, BRAF V600 Wild Type / BRAF V600 Results Pending or Unknown, Candidate for Immunotherapy Disease Classification: Melanoma Disease Subtype: Unknown BRAF V600 Mutation Status: Awaiting BRAF V600 Results Therapeutic Status: Distant Metastases Metastatic Disease Type: Symptomatic Line of Therapy: First Line Immunotherapy Candidate Status: Candidate for Immunotherapy Intent of Therapy: Curative Intent, Discussed with Patient

## 2020-10-05 NOTE — Progress Notes (Signed)
[facility] OFFICE PROGRESS NOTE   Diagnosis: Malignant melanoma  INTERVAL HISTORY:   Sean. Sean [name] underwent a left occipital craniotomy and left frontal/supraorbital craniotomy for tumor resection by Dr. Zada Finders on 09/29/2020.  Left occipital and left frontal lesions were resected en bloc.  The pathology from the left occipital and left frontal lesions returned as malignant melanoma.  He was discharged on 09/30/2020.  Sean [name] is here today with his wife.  He reports insomnia.  He continues a Decadron taper.  He has difficulty "focusing "with his vision and has decreased visual acuity.  The left foot strength has returned to normal.  Objective:  Vital signs in last 24 hours:  Blood pressure (!) 150/85, pulse 91, temperature 97.8 F (36.6 C), temperature source Tympanic, resp. rate 18, height 5' 11"  (1.803 m), weight 211 lb 4.8 oz (95.8 kg), SpO2 99 %.    HEENT: No thrush Resp: Lungs clear bilaterally Cardio: Regular rate and rhythm GI: No hepatosplenomegaly Vascular: No leg edema  Skin: Healing incisions at the occipital and left frontal scalp, no apparent primary tumor site    Lab Results:  Lab Results  Component Value Date   WBC 20.7 (H) 09/27/2020   HGB 16.6 09/27/2020   HCT 48.6 09/27/2020   MCV 92.7 09/27/2020   PLT 307 09/29/2020   NEUTROABS 15.8 (H) 09/14/2020    CMP  Lab Results  Component Value Date   NA 135 09/28/2020   K 4.1 09/28/2020   CL 97 (L) 09/28/2020   CO2 28 09/28/2020   GLUCOSE 110 (H) 09/28/2020   BUN 27 (H) 09/28/2020   CREATININE 0.77 09/28/2020   CALCIUM 9.2 09/28/2020   PROT 7.1 09/28/2020   ALBUMIN 3.9 09/28/2020   AST 73 (H) 09/28/2020   ALT 683 (HH) 09/28/2020   ALKPHOS 108 09/28/2020   BILITOT 0.7 09/28/2020   GFRNONAA >60 09/28/2020    Lab Results  Component Value Date   CEA1 0.6 09/15/2020    Medications: I have reviewed the patient's current medications.   Assessment/Plan: 1. Multiple brain  metastases, unknown primary tumor site  CT brain 09/14/2020-multiple hemorrhagic metastases with surrounding edema in the bilateral cerebral hemispheres  CTs chest, abdomen, pelvis 09/14/2020-1.4 cm right upper lobe subpleural nodule, 0.5 cm right lower lobe nodule, multiple hypodensities in the liver suspicious for metastases, 1.5 cm right upper pole hypodense kidney lesion-indeterminate  MRI abdomen 09/15/2020-multifocal T1 hyperintense liver lesions, no kidney mass  Ultrasound-guided biopsy of a segment 4B liver lesion 09/18/2020-benign liver parenchyma with steatosis, no evidence of malignancy  PET 09/25/2020- focal area of hypermetabolism in the right hilum, multiple areas of the liver, pelvic musculature, and left thigh-no CT correlate with any of these areas on the noncontrast CT, 10 mm anterior right upper lobe nodule with low-level hypermetabolism, additional tiny pulmonary nodules too small to characterize by PET  SRS to 10 brain lesions 09/27/2020  Resection of left occipital and left frontal lesions on 09/29/2020-pathology consistent with malignant melanoma    2.Left-sided weakness, new onset seizure secondary to #1, weakness has resolved 3.Hyperlipidemia 4.Elevated liver enzymes and bilirubin  Liver enzymes more elevated on repeat labs 09/27/2020, improved 09/28/2020 5.Headache and nausea/vomiting secondary to #1, resolved      Disposition: Sean. [name] has been diagnosed with metastatic malignant melanoma.  The staging evaluation is consistent with metastatic disease involving the brain, lungs, and liver.  He underwent SRS to multiple brain lesions and to larger lesions were resected on 09/29/2020.  He has recovered  well from the craniotomy surgery.  He will continue a Decadron taper as directed by neurosurgery.  The Decadron should be tapered off over the next several days.  I discussed the diagnosis of metastatic melanoma, the prognosis, and treatment options with Sean.  [name] and his wife.  We discussed several systemic treatment options including immunotherapy and targeted therapies.  I recommend treatment with ipilimumab and nivolumab as first-line treatment for metastatic melanoma.  We reviewed potential toxicities associated with the immunotherapy regimen including the chance of allergic reaction, rash, diarrhea, hepatitis, pneumonitis, and other autoimmune toxicities.  He agrees to proceed.  He will attend a chemotherapy teaching class today.  We discussed the potential for a durable treatment response with the ipilimumab/nivolumab regimen.  The brain tumor will be submitted for BRAF testing to determine his eligibility for targeted therapy in the future.  The plan is to begin ipilimumab/nivolumab on 10/16/2020.  An immunotherapy plan was entered today.  I recommended he discontinue alcohol.  We will monitor the liver enzymes closely during the course of immunotherapy.  I recommend he not drive or ride his bike.  He will consult with Dr. Zada Finders regarding the timing of his return to work and driving.  There is no known primary tumor site.  We made a dermatology referral for a complete skin exam.  Betsy Coder, MD  10/05/2020  10:07 AM

## 2020-10-05 NOTE — Progress Notes (Signed)
BRAF mutation request sent per MD Sherrill. Referral made to [facility] per MD [name].

## 2020-10-06 ENCOUNTER — Encounter: Payer: Self-pay | Admitting: Nurse Practitioner

## 2020-10-06 ENCOUNTER — Telehealth: Payer: Self-pay | Admitting: Oncology

## 2020-10-06 NOTE — Telephone Encounter (Signed)
Scheduled appointments per 1/20 los. Called patient, no answer. Left message with appointments dates and times.  

## 2020-10-09 ENCOUNTER — Encounter: Payer: Self-pay | Admitting: Nurse Practitioner

## 2020-10-09 ENCOUNTER — Encounter: Payer: Self-pay | Admitting: Oncology

## 2020-10-09 ENCOUNTER — Inpatient Hospital Stay: Payer: 59

## 2020-10-16 ENCOUNTER — Inpatient Hospital Stay: Payer: 59

## 2020-10-16 ENCOUNTER — Inpatient Hospital Stay: Payer: 59 | Admitting: Nurse Practitioner

## 2020-10-16 ENCOUNTER — Encounter: Payer: Self-pay | Admitting: Nurse Practitioner

## 2020-10-16 ENCOUNTER — Other Ambulatory Visit: Payer: Self-pay

## 2020-10-16 VITALS — BP 120/86 | HR 104 | Temp 98.1°F | Resp 16 | Ht 71.0 in | Wt 199.5 lb

## 2020-10-16 DIAGNOSIS — C439 Malignant melanoma of skin, unspecified: Secondary | ICD-10-CM

## 2020-10-16 DIAGNOSIS — C7931 Secondary malignant neoplasm of brain: Secondary | ICD-10-CM

## 2020-10-16 DIAGNOSIS — Z5112 Encounter for antineoplastic immunotherapy: Secondary | ICD-10-CM | POA: Diagnosis not present

## 2020-10-16 LAB — CBC WITH DIFFERENTIAL (CANCER CENTER ONLY)
Abs Immature Granulocytes: 0.12 10*3/uL — ABNORMAL HIGH (ref 0.00–0.07)
Basophils Absolute: 0 10*3/uL (ref 0.0–0.1)
Basophils Relative: 1 %
Eosinophils Absolute: 0.1 10*3/uL (ref 0.0–0.5)
Eosinophils Relative: 1 %
HCT: 45.2 % (ref 39.0–52.0)
Hemoglobin: 15.6 g/dL (ref 13.0–17.0)
Immature Granulocytes: 2 %
Lymphocytes Relative: 9 %
Lymphs Abs: 0.7 10*3/uL (ref 0.7–4.0)
MCH: 31 pg (ref 26.0–34.0)
MCHC: 34.5 g/dL (ref 30.0–36.0)
MCV: 89.9 fL (ref 80.0–100.0)
Monocytes Absolute: 0.7 10*3/uL (ref 0.1–1.0)
Monocytes Relative: 10 %
Neutro Abs: 5.4 10*3/uL (ref 1.7–7.7)
Neutrophils Relative %: 77 %
Platelet Count: 195 10*3/uL (ref 150–400)
RBC: 5.03 MIL/uL (ref 4.22–5.81)
RDW: 12.3 % (ref 11.5–15.5)
WBC Count: 7 10*3/uL (ref 4.0–10.5)
nRBC: 0 % (ref 0.0–0.2)

## 2020-10-16 LAB — CMP (CANCER CENTER ONLY)
ALT: 72 U/L — ABNORMAL HIGH (ref 0–44)
AST: 26 U/L (ref 15–41)
Albumin: 3.7 g/dL (ref 3.5–5.0)
Alkaline Phosphatase: 105 U/L (ref 38–126)
Anion gap: 11 (ref 5–15)
BUN: 15 mg/dL (ref 6–20)
CO2: 25 mmol/L (ref 22–32)
Calcium: 9.7 mg/dL (ref 8.9–10.3)
Chloride: 105 mmol/L (ref 98–111)
Creatinine: 0.8 mg/dL (ref 0.61–1.24)
GFR, Estimated: >60 mL/min (ref >60)
Glucose, Bld: 124 mg/dL — ABNORMAL HIGH (ref 70–99)
Potassium: 3.9 mmol/L (ref 3.5–5.1)
Sodium: 141 mmol/L (ref 135–145)
Total Bilirubin: 1 mg/dL (ref 0.3–1.2)
Total Protein: 7.4 g/dL (ref 6.5–8.1)

## 2020-10-16 LAB — TSH: TSH: 0.797 u[IU]/mL (ref 0.320–4.118)

## 2020-10-16 LAB — LACTATE DEHYDROGENASE: LDH: 357 U/L — ABNORMAL HIGH (ref 98–192)

## 2020-10-16 MED ORDER — ACETAMINOPHEN 500 MG PO TABS
1000.0000 mg | ORAL_TABLET | Freq: Once | ORAL | Status: AC
  Administered 2020-10-16: 1000 mg via ORAL

## 2020-10-16 MED ORDER — SODIUM CHLORIDE 0.9 % IV SOLN
3.0000 mg/kg | Freq: Once | INTRAVENOUS | Status: AC
  Administered 2020-10-16: 300 mg via INTRAVENOUS
  Filled 2020-10-16: qty 40

## 2020-10-16 MED ORDER — LEVETIRACETAM 500 MG PO TABS: 500.0000 mg | ORAL_TABLET | Freq: Two times a day (BID) | ORAL | 0 refills | Status: DC

## 2020-10-16 MED ORDER — ACETAMINOPHEN 500 MG PO TABS
ORAL_TABLET | ORAL | Status: AC
  Filled 2020-10-16: qty 2

## 2020-10-16 MED ORDER — FAMOTIDINE IN NACL 20-0.9 MG/50ML-% IV SOLN
INTRAVENOUS | Status: AC
  Filled 2020-10-16: qty 50

## 2020-10-16 MED ORDER — DIPHENHYDRAMINE HCL 50 MG/ML IJ SOLN
25.0000 mg | Freq: Once | INTRAMUSCULAR | Status: AC
  Administered 2020-10-16: 25 mg via INTRAVENOUS

## 2020-10-16 MED ORDER — HYDROCODONE-ACETAMINOPHEN 5-325 MG PO TABS: 1.0000 | ORAL_TABLET | Freq: Four times a day (QID) | ORAL | 0 refills | Status: DC | PRN

## 2020-10-16 MED ORDER — FAMOTIDINE IN NACL 20-0.9 MG/50ML-% IV SOLN
20.0000 mg | Freq: Once | INTRAVENOUS | Status: AC
  Administered 2020-10-16: 20 mg via INTRAVENOUS

## 2020-10-16 MED ORDER — DIPHENHYDRAMINE HCL 50 MG/ML IJ SOLN
INTRAMUSCULAR | Status: AC
  Filled 2020-10-16: qty 1

## 2020-10-16 MED ORDER — ONDANSETRON HCL 4 MG PO TABS: 4.0000 mg | ORAL_TABLET | Freq: Every day | ORAL | 0 refills | Status: DC | PRN

## 2020-10-16 MED ORDER — SODIUM CHLORIDE 0.9 % IV SOLN
100.0000 mg | Freq: Once | INTRAVENOUS | Status: AC
  Administered 2020-10-16: 100 mg via INTRAVENOUS
  Filled 2020-10-16: qty 10

## 2020-10-16 MED ORDER — SODIUM CHLORIDE 0.9 % IV SOLN
Freq: Once | INTRAVENOUS | Status: AC
  Filled 2020-10-16: qty 250

## 2020-10-16 MED ORDER — PANTOPRAZOLE SODIUM 40 MG PO TBEC: 40.0000 mg | DELAYED_RELEASE_TABLET | Freq: Every day | ORAL | 0 refills | Status: DC

## 2020-10-16 NOTE — Progress Notes (Addendum)
  [name] OFFICE PROGRESS NOTE   Diagnosis: Malignant melanoma  INTERVAL HISTORY:   Sean [name] returns as scheduled. Headaches are "not bad". Sean [name] reports visual loss since surgery. Sean [name] describes this more as blurry vision. No diplopia. Sean [name] denies nausea/vomiting. No diarrhea. No rash.  Objective:  Vital signs in last 24 hours:  Blood pressure 120/86, pulse (!) 104, temperature 98.1 F (36.7 C), temperature source Tympanic, resp. rate 16, height 5\' 11"  (1.803 m), weight 199 lb 8 oz (90.5 kg), SpO2 100 %.    HEENT: Pupils equal round and reactive to light. Extraocular movements intact. Sclera anicteric. Oropharynx is without thrush or ulceration. Resp: Lungs clear bilaterally. Cardio: Regular rate and rhythm. GI: No hepatosplenomegaly. Vascular: No leg edema.  Skin: Healed incisions at the occipital and left frontal scalp.   Lab Results:  Lab Results  Component Value Date   WBC 7.0 10/16/2020   HGB 15.6 10/16/2020   HCT 45.2 10/16/2020   MCV 89.9 10/16/2020   PLT 195 10/16/2020   NEUTROABS 5.4 10/16/2020    Imaging:  No results found.  Medications: I have reviewed the patient's current medications.  Assessment/Plan: 1. Multiple brain metastases, unknown primary tumor site  CT brain 09/14/2020-multiple hemorrhagic metastases with surrounding edema in the bilateral cerebral hemispheres  CTs chest, abdomen, pelvis 09/14/2020-1.4 cm right upper lobe subpleural nodule, 0.5 cm right lower lobe nodule, multiple hypodensities in the liver suspicious for metastases, 1.5 cm right upper pole hypodense kidney lesion-indeterminate  MRI abdomen 09/15/2020-multifocal T1 hyperintense liver lesions, no kidney mass  Ultrasound-guided biopsy of a segment 4B liver lesion 09/18/2020-benign liver parenchyma with steatosis, no evidence of malignancy  PET 09/25/2020- focal area of hypermetabolism in the right hilum, multiple areas of the liver, pelvic musculature, and left  thigh-no CT correlate with any of these areas on the noncontrast CT, 10 mm anterior right upper lobe nodule with low-level hypermetabolism, additional tiny pulmonary nodules too small to characterize by PET  SRS to 10 brain lesions 09/27/2020  Resection of left occipital and left frontal lesions on 09/29/2020-pathology consistent with malignant melanoma  Cycle 1 ipilimumab/nivolumab 10/16/2020    2.Left-sided weakness, new onset seizure secondary to #1, weakness has resolved 3.Hyperlipidemia 4.Elevated liver enzymes and bilirubin  Liver enzymes more elevated on repeat labs 09/27/2020, improved 09/28/2020 5.Headache and nausea/vomiting secondary to #1, resolved    Disposition: Sean [name] appears stable. Sean [name] is scheduled to begin treatment with ipilimumab/nivolumab today, plan every 3 weeks x 4 cycles then nivolumab every 2 weeks. We again reviewed potential toxicities. Sean [name] agrees to proceed.  Liver enzymes have been elevated. Results from today pending.  Visual disturbance may be related to surgery, radiation, brain metastases.  Sean [name] will return for lab, follow-up, cycle 2 ipilimumab/nivolumab in 3 weeks. We are available to see him sooner if needed.  Patient seen with Dr. [name].    Ned Card ANP/GNP-BC   10/16/2020  11:21 AM This was a shared visit with Ned Card.  Sean [name] appears stable.  Sean [name] has developed significant vision loss, potentially related to surgery, radiation, or brain metastases.  Sean [name] has been tapered off of Decadron.  The plan is to begin ipilimumab and nivolumab today.  Julieanne Manson, MD

## 2020-10-16 NOTE — Progress Notes (Signed)
Yervoy checklist completed. MD aware of pt's neuro status.

## 2020-10-16 NOTE — Patient Instructions (Signed)
[facility] Discharge Instructions for Patients Receiving Chemotherapy  Today you received the following chemotherapy agents Opdivo and Yervoy  To help prevent nausea and vomiting after your treatment, we encourage you to take your nausea medication as directed.    If you develop nausea and vomiting that is not controlled by your nausea medication, call the clinic.   BELOW ARE SYMPTOMS THAT SHOULD BE REPORTED IMMEDIATELY:  *FEVER GREATER THAN 100.5 F  *CHILLS WITH OR WITHOUT FEVER  NAUSEA AND VOMITING THAT IS NOT CONTROLLED WITH YOUR NAUSEA MEDICATION  *UNUSUAL SHORTNESS OF BREATH  *UNUSUAL BRUISING OR BLEEDING  TENDERNESS IN MOUTH AND THROAT WITH OR WITHOUT PRESENCE OF ULCERS  *URINARY PROBLEMS  *BOWEL PROBLEMS  UNUSUAL RASH Items with * indicate a potential emergency and should be followed up as soon as possible.  Feel free to call the clinic should you have any questions or concerns. The clinic phone number is [phone].  Please show the [facility] at check-in to the Emergency Department and triage nurse.  Nivolumab injection What is this medicine? NIVOLUMAB (nye VOL ue mab) is a monoclonal antibody. It treats certain types of cancer. Some of the cancers treated are colon cancer, head and neck cancer, Hodgkin lymphoma, lung cancer, and melanoma. This medicine may be used for other purposes; ask your health care provider or pharmacist if you have questions. COMMON BRAND NAME(S): Opdivo What should I tell my health care provider before I take this medicine? They need to know if you have any of these conditions:  autoimmune diseases like Crohn's disease, ulcerative colitis, or lupus  have had or planning to have an allogeneic stem cell transplant (uses someone else's stem cells)  history of chest radiation  history of organ transplant  nervous system problems like myasthenia gravis or Guillain-Barre syndrome  an unusual or allergic reaction  to nivolumab, other medicines, foods, dyes, or preservatives  pregnant or trying to get pregnant  breast-feeding How should I use this medicine? This medicine is for infusion into a vein. It is given by a health care professional in a hospital or clinic setting. A special MedGuide will be given to you before each treatment. Be sure to read this information carefully each time. Talk to your pediatrician regarding the use of this medicine in children. While this drug may be prescribed for children as Sean [name] as 12 years for selected conditions, precautions do apply. Overdosage: If you think you have taken too much of this medicine contact a poison control center or emergency room at once. NOTE: This medicine is only for you. Do not share this medicine with others. What if I miss a dose? It is important not to miss your dose. Call your doctor or health care professional if you are unable to keep an appointment. What may interact with this medicine? Interactions have not been studied. This list may not describe all possible interactions. Give your health care provider a list of all the medicines, herbs, non-prescription drugs, or dietary supplements you use. Also tell them if you smoke, drink alcohol, or use illegal drugs. Some items may interact with your medicine. What should I watch for while using this medicine? This drug may make you feel generally unwell. Continue your course of treatment even though you feel ill unless your doctor tells you to stop. You may need blood work done while you are taking this medicine. Do not become pregnant while taking this medicine or for 5 months after stopping it. Women  should inform their doctor if they wish to become pregnant or think they might be pregnant. There is a potential for serious side effects to an unborn child. Talk to your health care professional or pharmacist for more information. Do not breast-feed an infant while taking this medicine or for 5  months after stopping it. What side effects may I notice from receiving this medicine? Side effects that you should report to your doctor or health care professional as soon as possible:  allergic reactions like skin rash, itching or hives, swelling of the face, lips, or tongue  breathing problems  blood in the urine  bloody or watery diarrhea or black, tarry stools  changes in emotions or moods  changes in vision  chest pain  cough  dizziness  feeling faint or lightheaded, falls  fever, chills  headache with fever, neck stiffness, confusion, loss of memory, sensitivity to light, hallucination, loss of contact with reality, or seizures  joint pain  mouth sores  redness, blistering, peeling or loosening of the skin, including inside the mouth  severe muscle pain or weakness  signs and symptoms of high blood sugar such as dizziness; dry mouth; dry skin; fruity breath; nausea; stomach pain; increased hunger or thirst; increased urination  signs and symptoms of kidney injury like trouble passing urine or change in the amount of urine  signs and symptoms of liver injury like dark yellow or brown urine; general ill feeling or flu-like symptoms; light-colored stools; loss of appetite; nausea; right upper belly pain; unusually weak or tired; yellowing of the eyes or skin  swelling of the ankles, feet, hands  trouble passing urine or change in the amount of urine  unusually weak or tired  weight gain or loss Side effects that usually do not require medical attention (report to your doctor or health care professional if they continue or are bothersome):  bone pain  constipation  decreased appetite  diarrhea  muscle pain  nausea, vomiting  tiredness This list may not describe all possible side effects. Call your doctor for medical advice about side effects. You may report side effects to FDA at 1-800-FDA-1088. Where should I keep my medicine? This drug is given  in a hospital or clinic and will not be stored at home. NOTE: This sheet is a summary. It may not cover all possible information. If you have questions about this medicine, talk to your doctor, pharmacist, or health care provider.  2021 Elsevier/Gold Standard (2020-01-05 10:08:25)  Ipilimumab injection What is this medicine? IPILIMUMAB (IP i LIM ue mab) is a monoclonal antibody. It is used to treat colorectal cancer, kidney cancer, liver cancer, lung cancer, melanoma, and mesothelioma. This medicine may be used for other purposes; ask your health care provider or pharmacist if you have questions. COMMON BRAND NAME(S): YERVOY What should I tell my health care provider before I take this medicine? They need to know if you have any of these conditions: autoimmune diseases like Crohn's disease, ulcerative colitis, or lupus have had or planning to have an allogeneic stem cell transplant (uses someone else's stem cells) history of organ transplant nervous system problems like myasthenia gravis or Guillain-Barre syndrome an unusual or allergic reaction to ipilimumab, other medicines, foods, dyes, or preservatives pregnant or trying to get pregnant breast-feeding How should I use this medicine? This medicine is for infusion into a vein. It is given by a health care professional in a hospital or clinic setting. A special MedGuide will be given to you before  each treatment. Be sure to read this information carefully each time. Talk to your pediatrician regarding the use of this medicine in children. While this drug may be prescribed for children as Essie Gehret as 12 years for selected conditions, precautions do apply. Overdosage: If you think you have taken too much of this medicine contact a poison control center or emergency room at once. NOTE: This medicine is only for you. Do not share this medicine with others. What if I miss a dose? It is important not to miss your dose. Call your doctor or health  care professional if you are unable to keep an appointment. What may interact with this medicine? Interactions are not expected. This list may not describe all possible interactions. Give your health care provider a list of all the medicines, herbs, non-prescription drugs, or dietary supplements you use. Also tell them if you smoke, drink alcohol, or use illegal drugs. Some items may interact with your medicine. What should I watch for while using this medicine? Tell your doctor or healthcare professional if your symptoms do not start to get better or if they get worse. Do not become pregnant while taking this medicine or for 3 months after stopping it. Women should inform their doctor if they wish to become pregnant or think they might be pregnant. There is a potential for serious side effects to an unborn child. Talk to your health care professional or pharmacist for more information. Do not breast-feed an infant while taking this medicine or for 3 months after the last dose. Your condition will be monitored carefully while you are receiving this medicine. You may need blood work done while you are taking this medicine. What side effects may I notice from receiving this medicine? Side effects that you should report to your doctor or health care professional as soon as possible: allergic reactions like skin rash, itching or hives, swelling of the face, lips, or tongue black, tarry stools bloody or watery diarrhea changes in vision dizziness eye pain fast, irregular heartbeat feeling anxious feeling faint or lightheaded, falls nausea, vomiting pain, tingling, numbness in the hands or feet redness, blistering, peeling or loosening of the skin, including inside the mouth signs and symptoms of liver injury like dark yellow or brown urine; general ill feeling or flu-like symptoms; light-colored stools; loss of appetite; nausea; right upper belly pain; unusually weak or tired; yellowing of the eyes  or skin unusual bleeding or bruising Side effects that usually do not require medical attention (report to your doctor or health care professional if they continue or are bothersome): headache loss of appetite trouble sleeping This list may not describe all possible side effects. Call your doctor for medical advice about side effects. You may report side effects to FDA at 1-800-FDA-1088. Where should I keep my medicine? This drug is given in a hospital or clinic and will not be stored at home. NOTE: This sheet is a summary. It may not cover all possible information. If you have questions about this medicine, talk to your doctor, pharmacist, or health care provider.  2021 Elsevier/Gold Standard (2019-08-04 18:53:00)

## 2020-10-17 ENCOUNTER — Encounter (HOSPITAL_COMMUNITY): Payer: Self-pay | Admitting: Oncology

## 2020-10-18 ENCOUNTER — Telehealth: Payer: Self-pay | Admitting: Nurse Practitioner

## 2020-10-18 NOTE — Telephone Encounter (Signed)
Scheduled appointments per 1/31 los. Called patient, no answer. Left message with appointments dates and times.  

## 2020-10-19 ENCOUNTER — Telehealth: Payer: Self-pay

## 2020-10-19 NOTE — Telephone Encounter (Signed)
Returned call to pt regarding wife calling in after hours last pm. Pt was having loose stools yesterday and wife wanted to know if he could take imodium. Pt has not had any loose stools since late afternoon yesterday. Wife told imodium is ok to take. Wife also stated pt's left eye was tracking on and off, mostly when he was tired or fist waking up. MD notified.  At next appointment with [facility] Dr [name] to evaluate pt for Home OT and PT need  / for referral for these services.

## 2020-10-20 ENCOUNTER — Other Ambulatory Visit: Payer: Self-pay

## 2020-10-20 ENCOUNTER — Inpatient Hospital Stay (HOSPITAL_COMMUNITY)
Admission: EM | Admit: 2020-10-20 | Discharge: 2020-10-26 | DRG: 054 | Disposition: A | Discharge status: Rehabilitation Facility | Payer: 59 | Attending: Internal Medicine | Admitting: Internal Medicine

## 2020-10-20 ENCOUNTER — Emergency Department (HOSPITAL_COMMUNITY): Payer: 59

## 2020-10-20 ENCOUNTER — Telehealth: Payer: Self-pay

## 2020-10-20 ENCOUNTER — Other Ambulatory Visit: Payer: Self-pay | Admitting: Oncology

## 2020-10-20 DIAGNOSIS — E785 Hyperlipidemia, unspecified: Secondary | ICD-10-CM | POA: Diagnosis present

## 2020-10-20 DIAGNOSIS — D496 Neoplasm of unspecified behavior of brain: Secondary | ICD-10-CM

## 2020-10-20 DIAGNOSIS — C439 Malignant melanoma of skin, unspecified: Secondary | ICD-10-CM | POA: Diagnosis present

## 2020-10-20 DIAGNOSIS — G936 Cerebral edema: Secondary | ICD-10-CM | POA: Diagnosis present

## 2020-10-20 DIAGNOSIS — R4182 Altered mental status, unspecified: Secondary | ICD-10-CM

## 2020-10-20 DIAGNOSIS — Z20822 Contact with and (suspected) exposure to covid-19: Secondary | ICD-10-CM | POA: Diagnosis present

## 2020-10-20 DIAGNOSIS — G8194 Hemiplegia, unspecified affecting left nondominant side: Secondary | ICD-10-CM | POA: Diagnosis not present

## 2020-10-20 DIAGNOSIS — R4701 Aphasia: Secondary | ICD-10-CM | POA: Diagnosis present

## 2020-10-20 DIAGNOSIS — G9341 Metabolic encephalopathy: Secondary | ICD-10-CM | POA: Diagnosis present

## 2020-10-20 DIAGNOSIS — Z515 Encounter for palliative care: Secondary | ICD-10-CM | POA: Diagnosis not present

## 2020-10-20 DIAGNOSIS — R414 Neurologic neglect syndrome: Secondary | ICD-10-CM | POA: Diagnosis present

## 2020-10-20 DIAGNOSIS — R569 Unspecified convulsions: Secondary | ICD-10-CM | POA: Diagnosis present

## 2020-10-20 DIAGNOSIS — G8191 Hemiplegia, unspecified affecting right dominant side: Secondary | ICD-10-CM | POA: Diagnosis present

## 2020-10-20 DIAGNOSIS — R32 Unspecified urinary incontinence: Secondary | ICD-10-CM | POA: Diagnosis present

## 2020-10-20 DIAGNOSIS — Z87891 Personal history of nicotine dependence: Secondary | ICD-10-CM

## 2020-10-20 DIAGNOSIS — K769 Liver disease, unspecified: Secondary | ICD-10-CM | POA: Diagnosis present

## 2020-10-20 DIAGNOSIS — I619 Nontraumatic intracerebral hemorrhage, unspecified: Secondary | ICD-10-CM | POA: Diagnosis present

## 2020-10-20 DIAGNOSIS — C7931 Secondary malignant neoplasm of brain: Secondary | ICD-10-CM | POA: Diagnosis present

## 2020-10-20 DIAGNOSIS — Z7189 Other specified counseling: Secondary | ICD-10-CM | POA: Diagnosis not present

## 2020-10-20 DIAGNOSIS — Z79899 Other long term (current) drug therapy: Secondary | ICD-10-CM

## 2020-10-20 LAB — COMPREHENSIVE METABOLIC PANEL
ALT: 48 U/L — ABNORMAL HIGH (ref 0–44)
AST: 27 U/L (ref 15–41)
Albumin: 3.3 g/dL — ABNORMAL LOW (ref 3.5–5.0)
Alkaline Phosphatase: 110 U/L (ref 38–126)
Anion gap: 13 (ref 5–15)
BUN: 18 mg/dL (ref 6–20)
CO2: 20 mmol/L — ABNORMAL LOW (ref 22–32)
Calcium: 9 mg/dL (ref 8.9–10.3)
Chloride: 107 mmol/L (ref 98–111)
Creatinine, Ser: 0.74 mg/dL (ref 0.61–1.24)
GFR, Estimated: >60 mL/min (ref >60)
Glucose, Bld: 104 mg/dL — ABNORMAL HIGH (ref 70–99)
Potassium: 3.9 mmol/L (ref 3.5–5.1)
Sodium: 140 mmol/L (ref 135–145)
Total Bilirubin: 1.5 mg/dL — ABNORMAL HIGH (ref 0.3–1.2)
Total Protein: 6.6 g/dL (ref 6.5–8.1)

## 2020-10-20 LAB — CBC WITH DIFFERENTIAL/PLATELET
Abs Immature Granulocytes: 0.1 10*3/uL — ABNORMAL HIGH (ref 0.00–0.07)
Basophils Absolute: 0 10*3/uL (ref 0.0–0.1)
Basophils Relative: 1 %
Eosinophils Absolute: 0.1 10*3/uL (ref 0.0–0.5)
Eosinophils Relative: 2 %
HCT: 41.6 % (ref 39.0–52.0)
Hemoglobin: 15 g/dL (ref 13.0–17.0)
Immature Granulocytes: 2 %
Lymphocytes Relative: 9 %
Lymphs Abs: 0.5 10*3/uL — ABNORMAL LOW (ref 0.7–4.0)
MCH: 32.2 pg (ref 26.0–34.0)
MCHC: 36.1 g/dL — ABNORMAL HIGH (ref 30.0–36.0)
MCV: 89.3 fL (ref 80.0–100.0)
Monocytes Absolute: 0.8 10*3/uL (ref 0.1–1.0)
Monocytes Relative: 12 %
Neutro Abs: 4.7 10*3/uL (ref 1.7–7.7)
Neutrophils Relative %: 74 %
Platelets: 200 10*3/uL (ref 150–400)
RBC: 4.66 MIL/uL (ref 4.22–5.81)
RDW: 12.5 % (ref 11.5–15.5)
WBC: 6.2 10*3/uL (ref 4.0–10.5)
nRBC: 0 % (ref 0.0–0.2)

## 2020-10-20 LAB — SARS CORONAVIRUS 2 BY RT PCR (HOSPITAL ORDER, PERFORMED IN ~~LOC~~ HOSPITAL LAB): SARS Coronavirus 2: NEGATIVE

## 2020-10-20 LAB — LACTIC ACID, PLASMA: Lactic Acid, Venous: 0.9 mmol/L (ref 0.5–1.9)

## 2020-10-20 IMAGING — DX DG CHEST 2V
2 series · 2 of 2 positions shown · non-contrast
Comparison: PET-CT [DATE]

CLINICAL DATA: Altered mental status

EXAM:
CHEST - 2 VIEW

[chest lat]
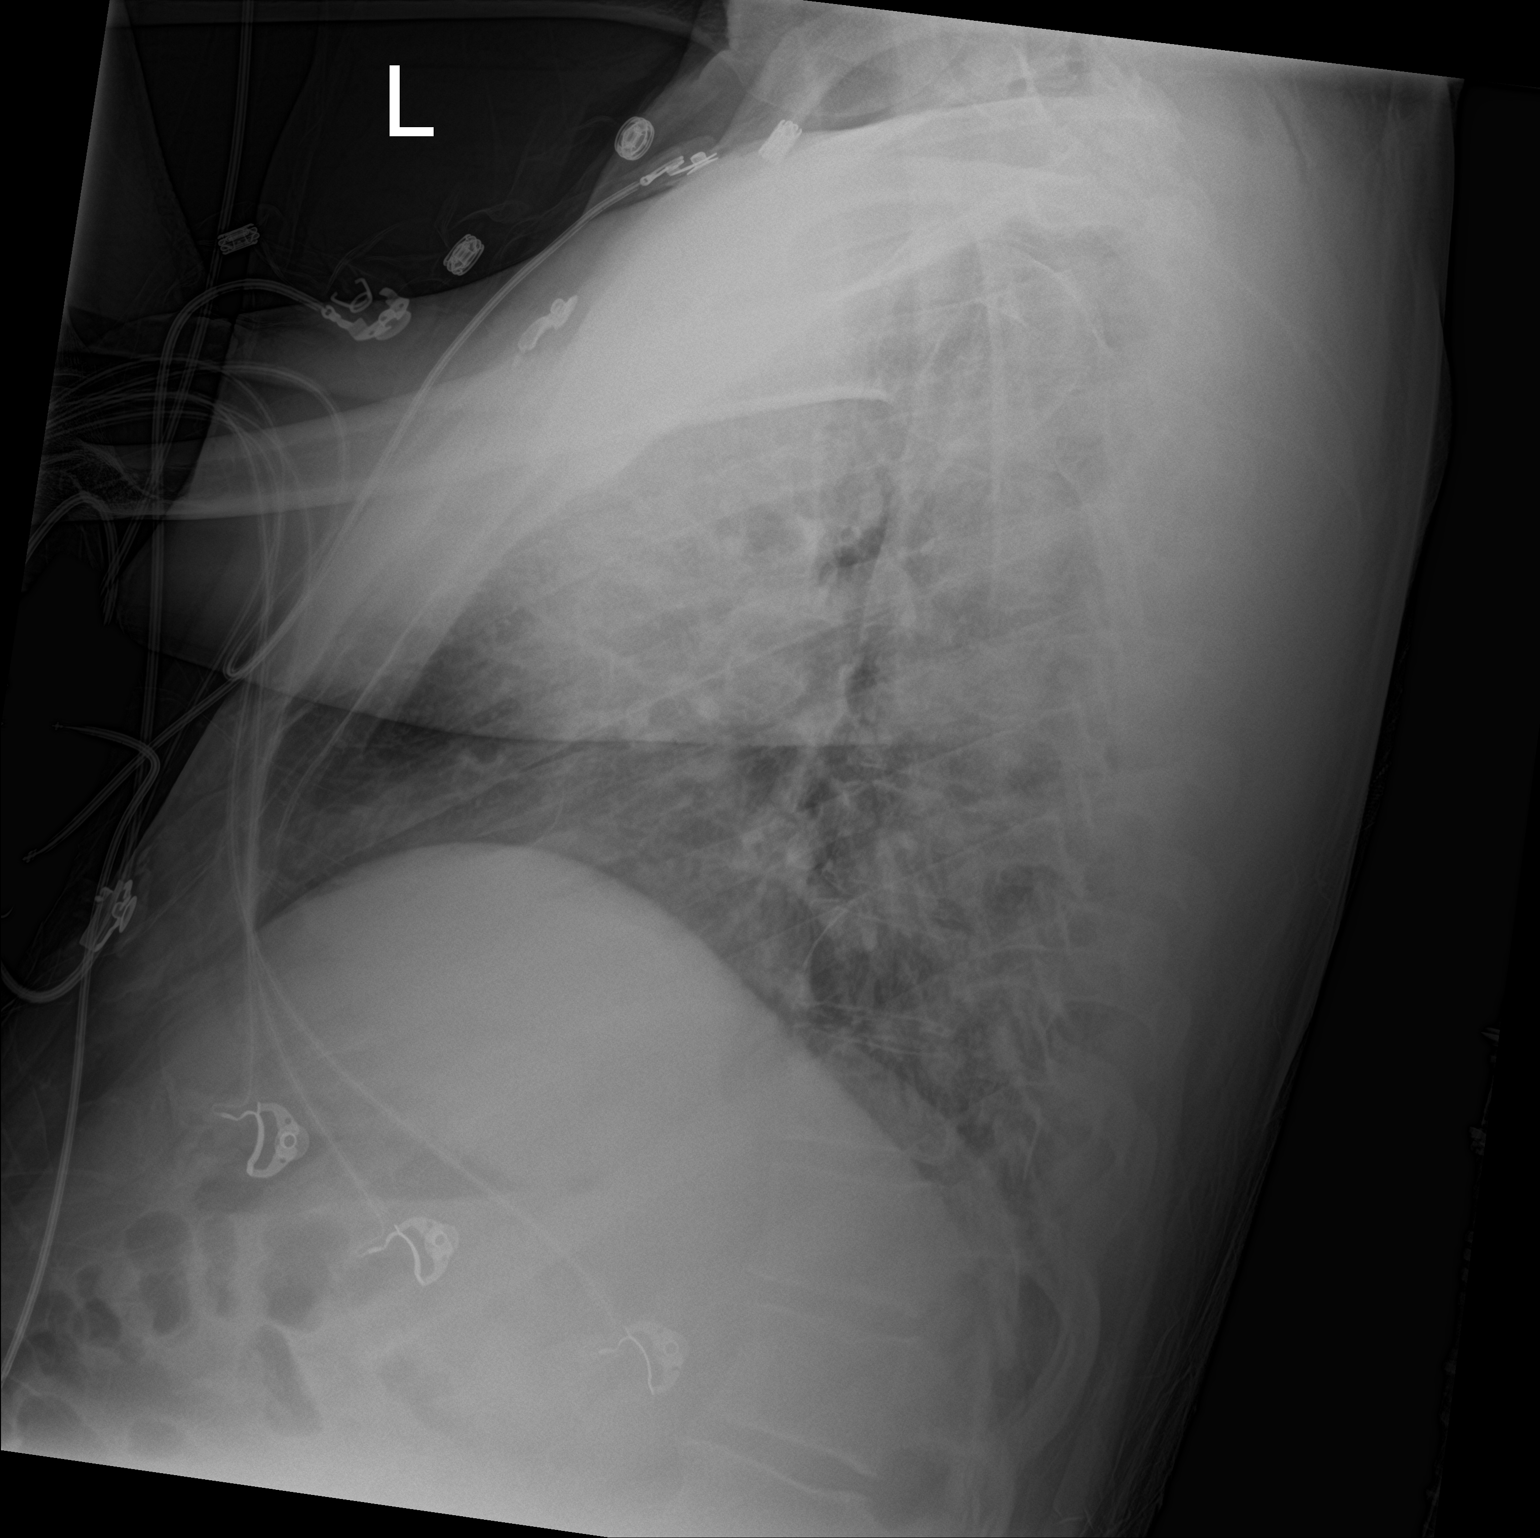

[chest ap]
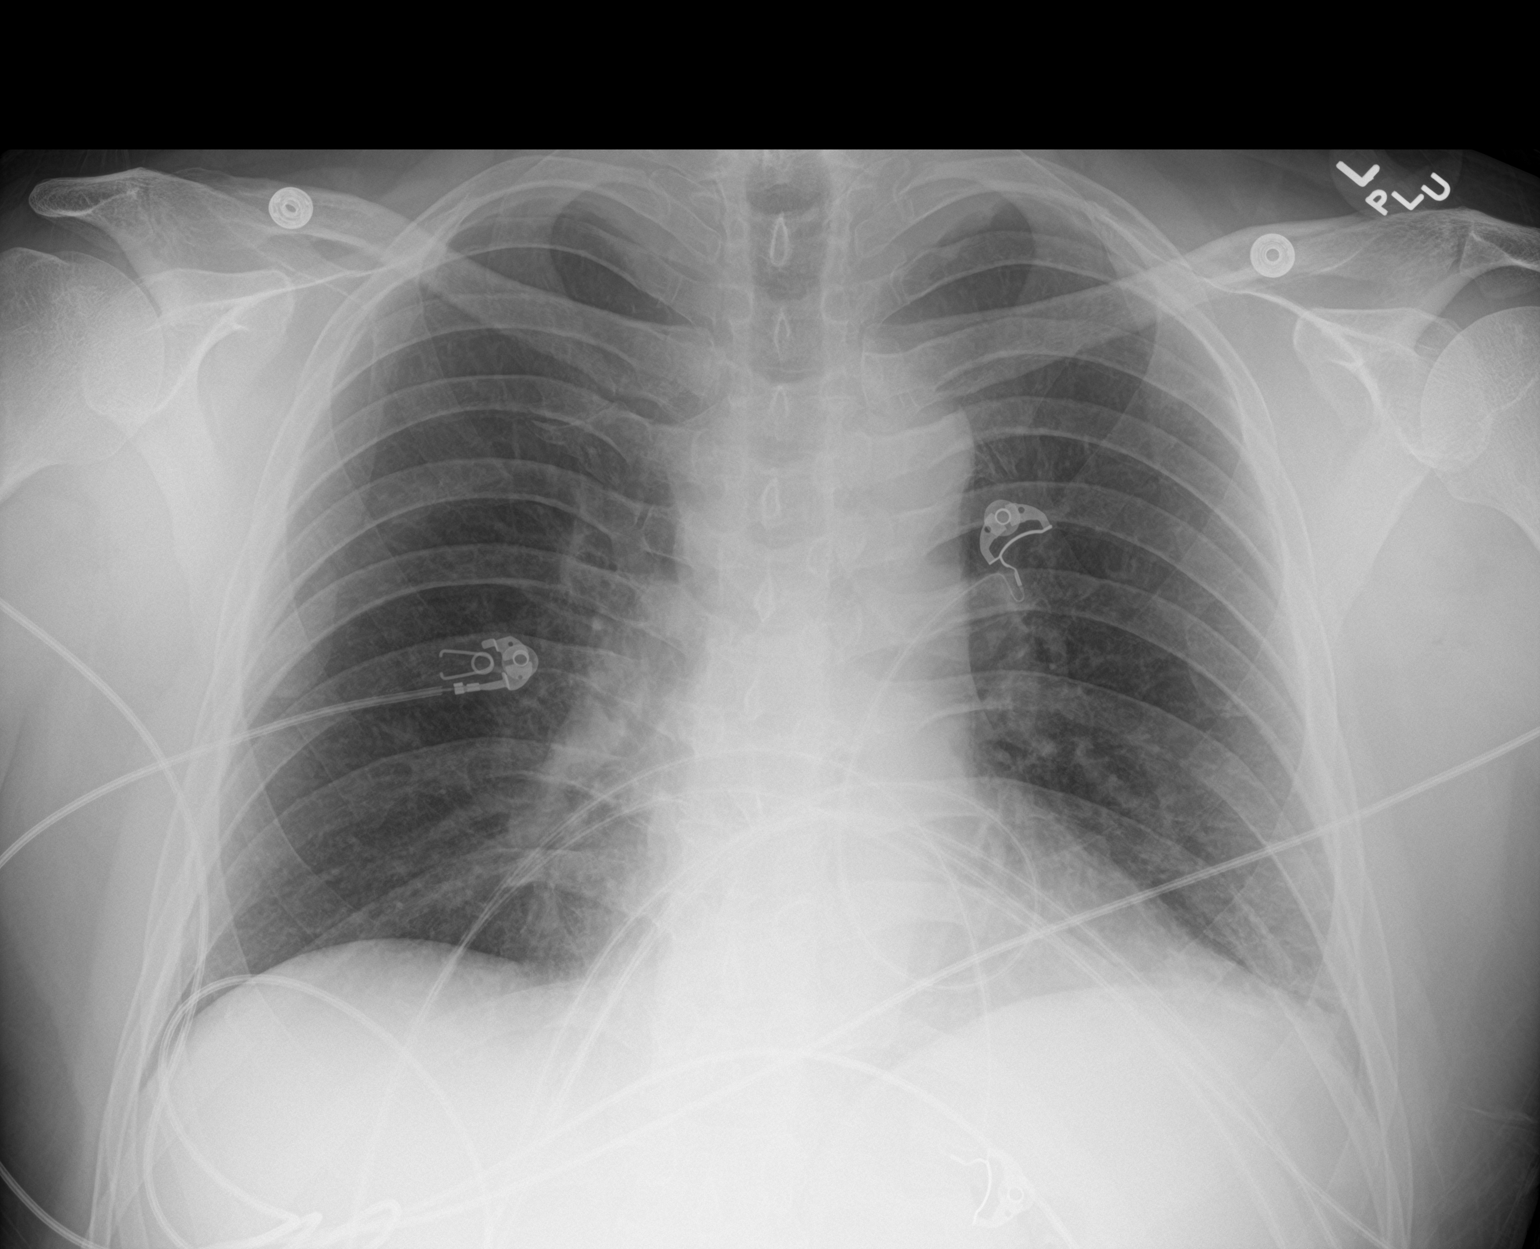

[2 of 2 positions shown; findings below may reference images not displayed]

FINDINGS: The heart size and mediastinal contours are within normal limits.
Streaky left basilar opacity not significantly changed from prior
favor atelectasis. Known right upper lobe pulmonary nodule not
visualized on today's exam. No pleural effusion. No pneumothorax.
The visualized skeletal structures are unremarkable.
IMPRESSION: Streaky left basilar opacity, favor atelectasis though developing
infection not excluded.

## 2020-10-20 IMAGING — CT CT HEAD W/O CM
2 series · 15 of 30 positions shown, 17 images · non-contrast
Comparison: MRI head [DATE]

CLINICAL DATA: Mental status change, unknown cause.

EXAM:
CT HEAD WITHOUT CONTRAST
TECHNIQUE: Contiguous axial images were obtained from the base of the skull
through the vertex without intravenous contrast.

[Series 3: head wo · axial · 0.44mm/px · z∈[+1157,+1267]mm · 7 of 30 slices shown, 9 images]
[im 4/30  brain]
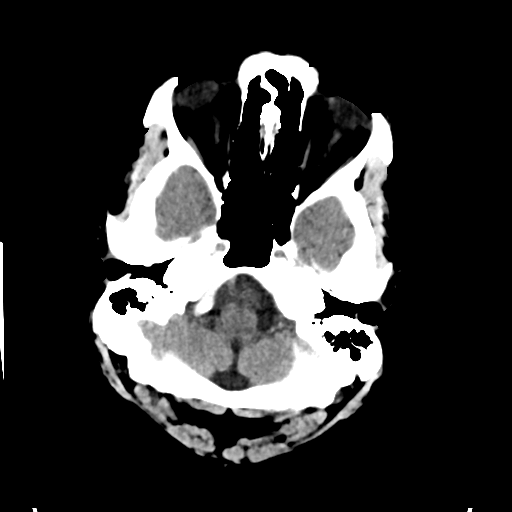
[im 4/30  bone]
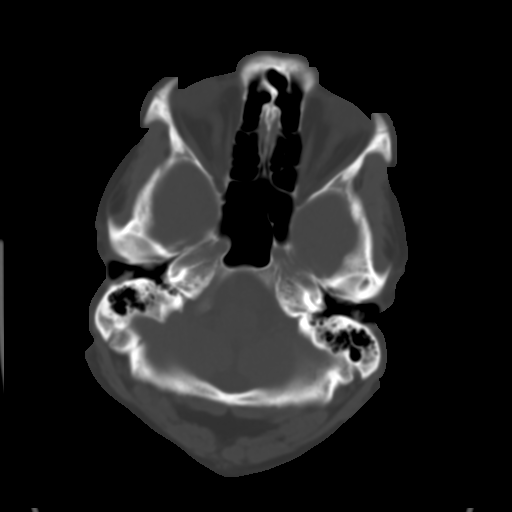
[im 8/30  brain]
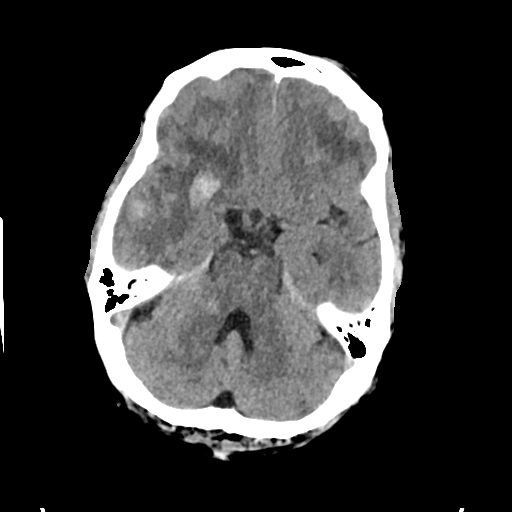
[im 11/30  brain]
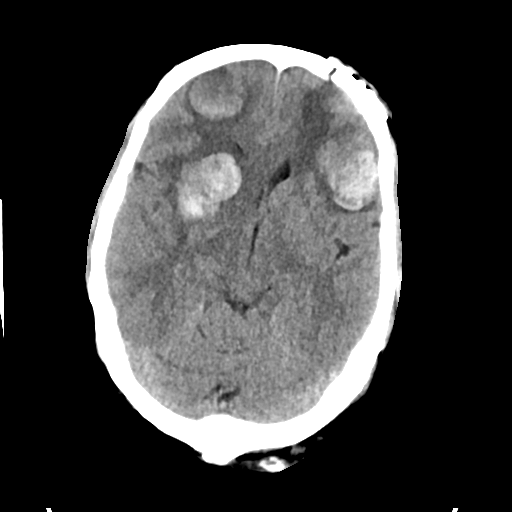
[im 15/30  brain]
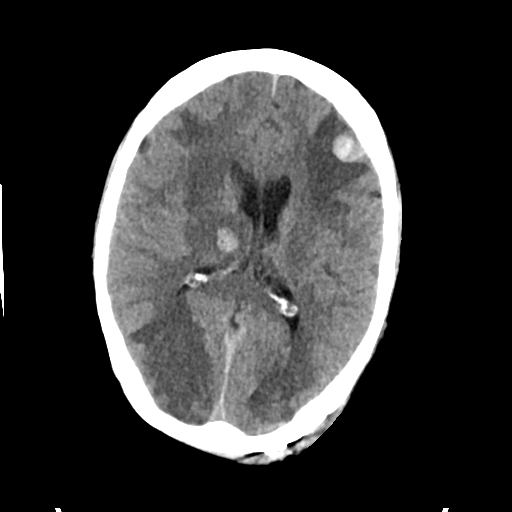
[im 19/30  brain]
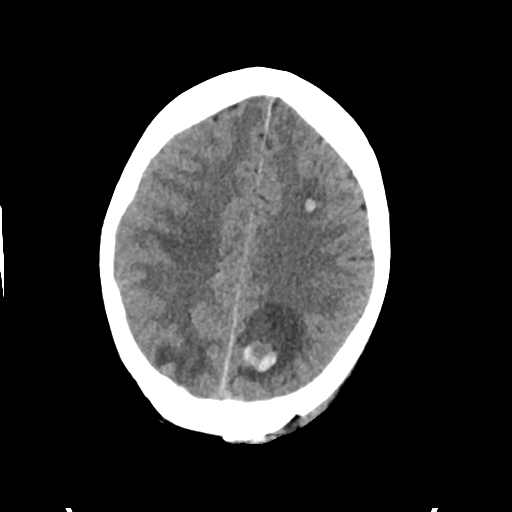
[im 19/30  bone]
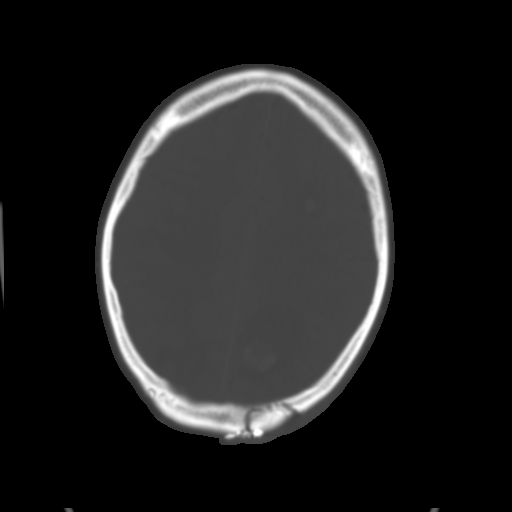
[im 22/30  brain]
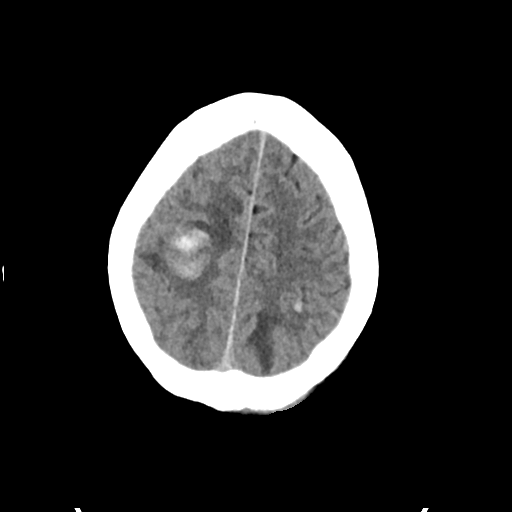
[im 26/30  brain]
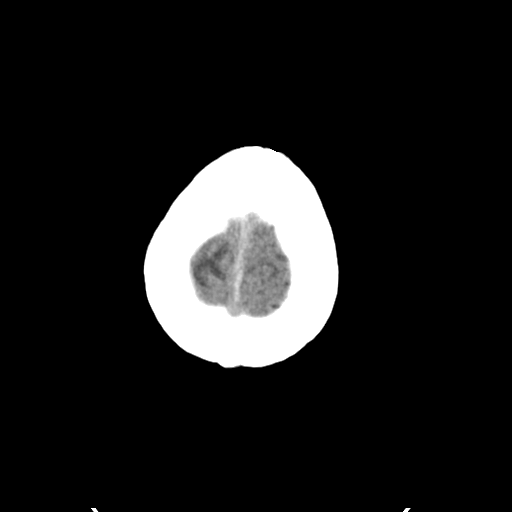

[Series 4: head bone · axial · 0.44mm/px · z∈[+1156,+1272]mm · 8 of 74 slices shown]
[im 8/74  bone]
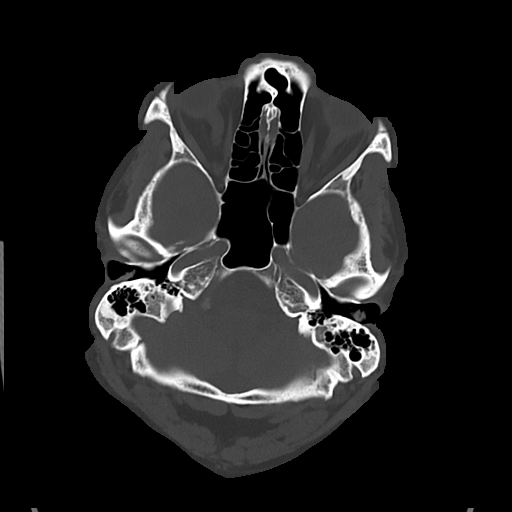
[im 15/74  bone]
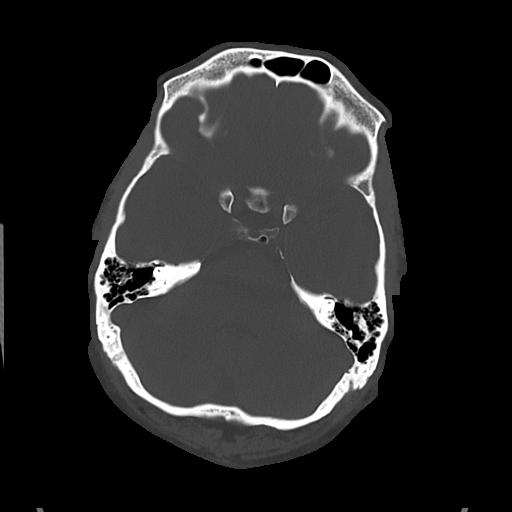
[im 22/74  bone]
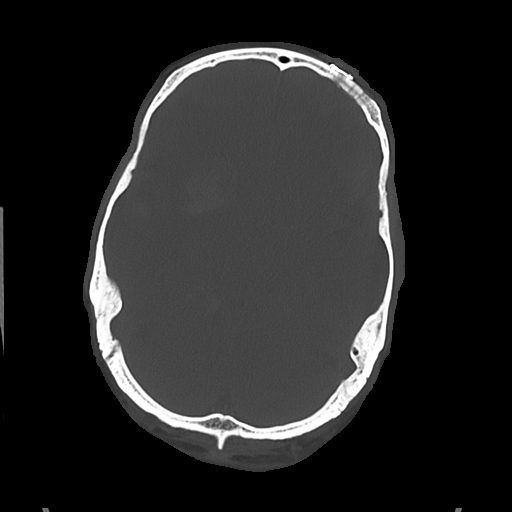
[im 33/74  bone]
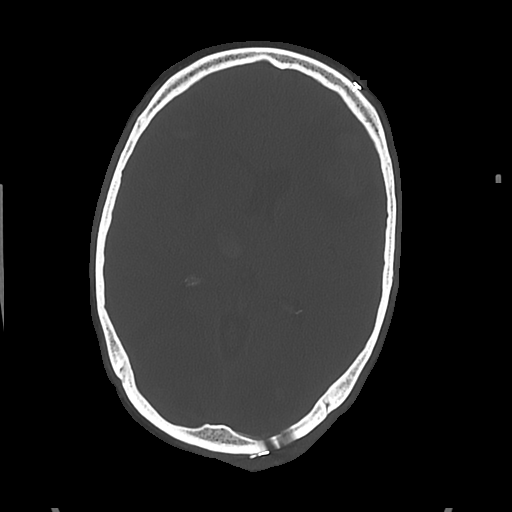
[im 41/74  bone]
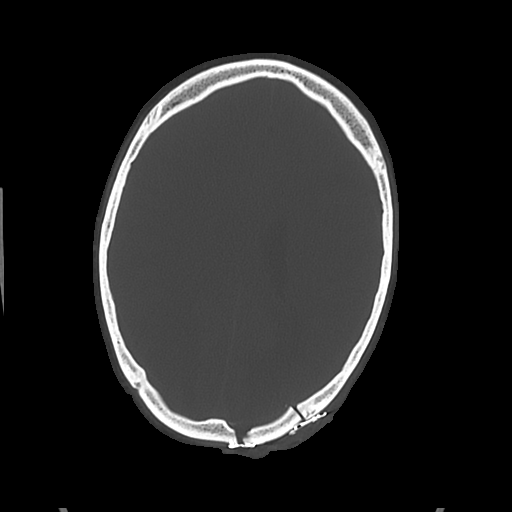
[im 52/74  bone]
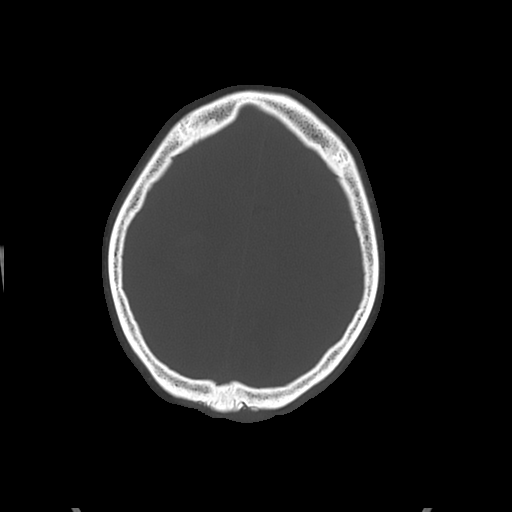
[im 59/74  bone]
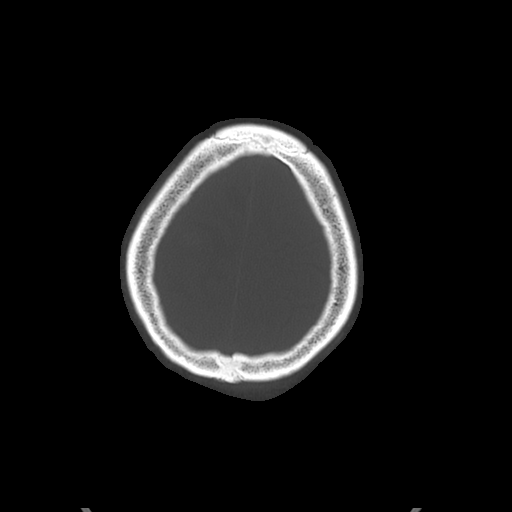
[im 66/74  bone]
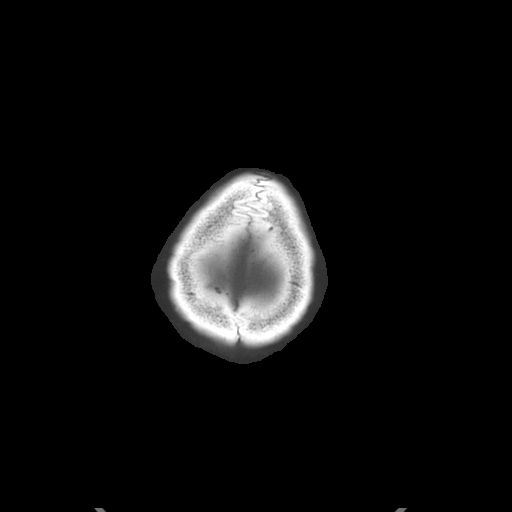

[15 of 30 positions shown; findings below may reference images not displayed]

FINDINGS: Brain: Interval increase in number and size of numerous
supratentorial hemorrhagic metastases. The largest lesion on the
left measures up to 5 point 1 cm in the left frontal lobe. The
largest lesion on the right measures up to 3.3 cm in the right
frontal lobe. Of note, there is a hemorrhagic lesion in the right
thalamus. There is exuberant vasogenic edema associated with these
lesions. No substantial midline shift. There is significant
effacement of the right lateral ventricle. No hydrocephalus.
Infratentorially, there is an 8 mm hemorrhagic metastasis within the
superior right cerebellum.

Vascular: No hyperdense vessel identified.

Skull: No acute fracture. Prior left frontal and left parietal
craniotomy.

Sinuses/Orbits: No acute findings.

Other: No mastoid effusions.
IMPRESSION: Significant interval increase in number and size of numerous
infratentorial and supratentorial hemorrhagic metastases with
exuberant edema, as detailed above. Resulting effacement of the
right lateral ventricle. An MRI with contrast could provide more
complete evaluation if clinically indicated.

Findings discussed with Dr. BRACK TIGER Via telephone at [DATE].

## 2020-10-20 MED ORDER — ACETAMINOPHEN 325 MG PO TABS
650.0000 mg | ORAL_TABLET | Freq: Four times a day (QID) | ORAL | Status: DC | PRN
  Administered 2020-10-23 – 2020-10-25 (×5): 650 mg via ORAL
  Filled 2020-10-20 (×5): qty 2

## 2020-10-20 MED ORDER — SODIUM CHLORIDE 0.9 % IV BOLUS
1000.0000 mL | Freq: Once | INTRAVENOUS | Status: AC
  Administered 2020-10-20: 1000 mL via INTRAVENOUS

## 2020-10-20 MED ORDER — ONDANSETRON HCL 4 MG PO TABS: 4.0000 mg | ORAL_TABLET | Freq: Four times a day (QID) | ORAL | Status: DC | PRN

## 2020-10-20 MED ORDER — SODIUM CHLORIDE 0.9% FLUSH
3.0000 mL | Freq: Two times a day (BID) | INTRAVENOUS | Status: DC
  Administered 2020-10-20 – 2020-10-26 (×12): 3 mL via INTRAVENOUS

## 2020-10-20 MED ORDER — DEXAMETHASONE SODIUM PHOSPHATE 4 MG/ML IJ SOLN
4.0000 mg | Freq: Four times a day (QID) | INTRAMUSCULAR | Status: DC
  Administered 2020-10-21 – 2020-10-23 (×10): 4 mg via INTRAVENOUS
  Filled 2020-10-20 (×10): qty 1

## 2020-10-20 MED ORDER — ONDANSETRON HCL 4 MG/2ML IJ SOLN: 4.0000 mg | Freq: Four times a day (QID) | INTRAMUSCULAR | Status: DC | PRN

## 2020-10-20 MED ORDER — ACETAMINOPHEN 650 MG RE SUPP: 650.0000 mg | Freq: Four times a day (QID) | RECTAL | Status: DC | PRN

## 2020-10-20 MED ORDER — DEXAMETHASONE SODIUM PHOSPHATE 4 MG/ML IJ SOLN
4.0000 mg | Freq: Once | INTRAMUSCULAR | Status: AC
  Administered 2020-10-20: 4 mg via INTRAVENOUS
  Filled 2020-10-20: qty 1

## 2020-10-20 MED ORDER — MORPHINE SULFATE (PF) 4 MG/ML IV SOLN
4.0000 mg | Freq: Once | INTRAVENOUS | Status: AC
  Administered 2020-10-20: 4 mg via INTRAVENOUS
  Filled 2020-10-20: qty 1

## 2020-10-20 NOTE — ED Triage Notes (Signed)
BIB [facility] after wife called due to patient displaying R sided weekness, R sided neglect and aphasia that started on Monday of this week, along with increase in weakness, fatigue, decrease in ability to perform ADLs, and increase in urinary incontinence. Pt does have hx of brain cancer per EMS and did recently have an operation, but these symptoms are new for pt since Monday. VSS w/ EMS. Pt AOx2. Pt usually AOx4.

## 2020-10-20 NOTE — ED Notes (Signed)
Patient transported to CT 

## 2020-10-20 NOTE — Telephone Encounter (Signed)
Wife contacted regarding message she left with education nurse about pt having a fever. Determined machine error. Wife stated pt very confused, incontinent, and not getting out of bed. Dr [name] notified and said for wife to bring pt to ER now to be assessed. Wife verbalized understanding.

## 2020-10-20 NOTE — ED Notes (Signed)
Pt taken to CT.

## 2020-10-20 NOTE — ED Provider Notes (Signed)
[facility] EMERGENCY DEPARTMENT Provider Note   CSN: [id number] Arrival date & time: 10/20/20  1654     History Chief Complaint  Patient presents with  . Weakness  . Fatigue  . stroke like symptoms    Kahlil Cowans is a 55 y.o. male.  Pt presents to the ED today with AMS.  Pt has a hx of multiple brain mets and had a craniotomy with tumor removal on the left in January.  Pathology showed malignant melanoma.  Since 1/31, pt has been having right side neglect with some weakness and increase in urinary incontinence.  Pt is now off decadron.  He has had some vision problems since his surgery.        Past Medical History:  Diagnosis Date  . Brain lesion 09/14/2020  . Hyperlipidemia   . Liver lesion 09/14/2020    Patient Active Problem List   Diagnosis Date Noted  . Melanoma of skin ([place]) 10/05/2020  . Status post craniotomy 09/29/2020  . Brain tumor ([place]) 09/29/2020  . Elevated blood pressure reading 09/15/2020  . Metastatic cancer ([place]) 09/15/2020  . Uncomplicated alcohol dependence ([place])   . Seizure ([place])   . Hypokalemia   . Hyponatremia   . Elevated LFTs   . Brain metastases ([place]) 09/14/2020    Past Surgical History:  Procedure Laterality Date  . APPLICATION OF CRANIAL NAVIGATION  09/29/2020   Procedure: APPLICATION OF CRANIAL NAVIGATION;  Surgeon: Judith Part, MD;  Location: [facility];  Service: Neurosurgery;;  . Kyla Balzarine Left 09/29/2020   Procedure: Left craniotomy for tumor resection with brainlab;  Surgeon: Judith Part, MD;  Location: [facility];  Service: Neurosurgery;  Laterality: Left;  . HERNIA REPAIR     left side inguinal hernia about 18-20 years       Family History  Problem Relation Age of Onset  . Osteoarthritis Mother   . Hypertension Father   . Colon polyps Father   . Colon polyps Brother   . Colon polyps Brother   . Colon cancer Neg Hx   . Stomach cancer Neg Hx   . Rectal cancer Neg Hx   . Esophageal cancer  Neg Hx   . Liver cancer Neg Hx     Social History   Tobacco Use  . Smoking status: Former Research scientist (life sciences)  . Smokeless tobacco: Never Used  . Tobacco comment: 09/27/20: per patient quit about over 20 years ago   Vaping Use  . Vaping Use: Never used  Substance Use Topics  . Alcohol use: Yes    Comment: 2-3 glasses of wine a day  . Drug use: No    Home Medications Prior to Admission medications   Medication Sig Start Date End Date Taking? Authorizing Provider  acetaminophen (TYLENOL) 500 MG tablet Take 500 mg by mouth every 6 (six) hours as needed for mild pain.    [provider]  HYDROcodone-acetaminophen (NORCO/VICODIN) 5-325 MG tablet Take 1 tablet by mouth every 6 (six) hours as needed for moderate pain. 10/16/20   Owens Shark, NP  levETIRAcetam (KEPPRA) 500 MG tablet Take 1 tablet (500 mg total) by mouth 2 (two) times daily. 10/16/20   Owens Shark, NP  Multiple Vitamin (MULTIVITAMIN WITH MINERALS) TABS tablet Take 1 tablet by mouth daily. 09/19/20   Geradine Girt, DO  ondansetron (ZOFRAN) 4 MG tablet Take 1 tablet (4 mg total) by mouth daily as needed for nausea or vomiting. 10/16/20 10/16/21  Owens Shark, NP  pantoprazole (PROTONIX) 40 MG tablet Take 1 tablet (40 mg total) by mouth daily at 12 noon. 10/16/20   Owens Shark, NP    Allergies    Patient has no known allergies.  Review of Systems   Review of Systems  Neurological: Positive for weakness.  All other systems reviewed and are negative.   Physical Exam Updated Vital Signs BP 120/87   Pulse 97   Temp 98.9 F (37.2 C) (Oral)   Resp (!) 23   Ht 5\' 11"  (1.803 m)   Wt 90 kg   SpO2 93%   BMI 27.67 kg/m   Physical Exam Vitals and nursing note reviewed.  Constitutional:      Appearance: Normal appearance.  HENT:     Head: Normocephalic and atraumatic.     Right Ear: External ear normal.     Left Ear: External ear normal.     Nose: Nose normal.     Mouth/Throat:     Mouth: Mucous membranes are  moist.     Pharynx: Oropharynx is clear.  Eyes:     Extraocular Movements: Extraocular movements intact.     Conjunctiva/sclera: Conjunctivae normal.     Pupils: Pupils are equal, round, and reactive to light.  Cardiovascular:     Rate and Rhythm: Normal rate and regular rhythm.     Pulses: Normal pulses.     Heart sounds: Normal heart sounds.  Pulmonary:     Effort: Pulmonary effort is normal.     Breath sounds: Normal breath sounds.  Abdominal:     General: Abdomen is flat. Bowel sounds are normal.     Palpations: Abdomen is soft.  Musculoskeletal:        General: Normal range of motion.     Cervical back: Normal range of motion and neck supple.  Skin:    General: Skin is warm.     Capillary Refill: Capillary refill takes less than 2 seconds.  Neurological:     Mental Status: He is alert. He is disoriented.     Comments: Right sided weakness  Left visual field defect  Psychiatric:        Mood and Affect: Mood normal.        Behavior: Behavior normal.        Thought Content: Thought content normal.        Judgment: Judgment normal.     ED Results / Procedures / Treatments   Labs (all labs ordered are listed, but only abnormal results are displayed) Labs Reviewed  CBC WITH DIFFERENTIAL/PLATELET - Abnormal; Notable for the following components:      Result Value   MCHC 36.1 (*)    Lymphs Abs 0.5 (*)    Abs Immature Granulocytes 0.10 (*)    All other components within normal limits  COMPREHENSIVE METABOLIC PANEL - Abnormal; Notable for the following components:   CO2 20 (*)    Glucose, Bld 104 (*)    Albumin 3.3 (*)    ALT 48 (*)    Total Bilirubin 1.5 (*)    All other components within normal limits  SARS CORONAVIRUS 2 BY RT PCR (HOSPITAL ORDER, [facility])  URINE CULTURE  CULTURE, BLOOD (ROUTINE X 2)  CULTURE, BLOOD (ROUTINE X 2)  LACTIC ACID, PLASMA  URINALYSIS, ROUTINE W REFLEX MICROSCOPIC  LACTIC ACID, PLASMA  CBG MONITORING, ED     EKG None  Radiology DG Chest 2 View  Result Date: 10/20/2020 CLINICAL DATA:  Altered mental  status EXAM: CHEST - 2 VIEW COMPARISON:  PET-CT September 25, 2020 FINDINGS: The heart size and mediastinal contours are within normal limits. Streaky left basilar opacity not significantly changed from prior favor atelectasis. Known right upper lobe pulmonary nodule not visualized on today's exam. No pleural effusion. No pneumothorax. The visualized skeletal structures are unremarkable. IMPRESSION: Streaky left basilar opacity, favor atelectasis though developing infection not excluded. Electronically Signed   By: Dahlia Bailiff MD   On: 10/20/2020 17:55   CT Head Wo Contrast  Result Date: 10/20/2020 CLINICAL DATA:  Mental status change, unknown cause. EXAM: CT HEAD WITHOUT CONTRAST TECHNIQUE: Contiguous axial images were obtained from the base of the skull through the vertex without intravenous contrast. COMPARISON:  MRI head 09/30/20 FINDINGS: Brain: Interval increase in number and size of numerous supratentorial hemorrhagic metastases. The largest lesion on the left measures up to 5 point 1 cm in the left frontal lobe. The largest lesion on the right measures up to 3.3 cm in the right frontal lobe. Of note, there is a hemorrhagic lesion in the right thalamus. There is exuberant vasogenic edema associated with these lesions. No substantial midline shift. There is significant effacement of the right lateral ventricle. No hydrocephalus. Infratentorially, there is an 8 mm hemorrhagic metastasis within the superior right cerebellum. Vascular: No hyperdense vessel identified. Skull: No acute fracture. Prior left frontal and left parietal craniotomy. Sinuses/Orbits: No acute findings. Other: No mastoid effusions. IMPRESSION: Significant interval increase in number and size of numerous infratentorial and supratentorial hemorrhagic metastases with exuberant edema, as detailed above. Resulting effacement of the right  lateral ventricle. An MRI with contrast could provide more complete evaluation if clinically indicated. Findings discussed with Dr. Gilford Raid Via telephone at 6:32 PM. Electronically Signed   By: Margaretha Sheffield MD   On: 10/20/2020 18:35    Procedures Procedures   Medications Ordered in ED Medications  dexamethasone (DECADRON) injection 4 mg (has no administration in time range)  sodium chloride 0.9 % bolus 1,000 mL (1,000 mLs Intravenous New Bag/Given 10/20/20 1728)    ED Course  I have reviewed the triage vital signs and the nursing notes.  Pertinent labs & imaging results that were available during my care of the patient were reviewed by me and considered in my medical decision making (see chart for details).    MDM Rules/Calculators/A&P                          Pt unfortunately has worsening of his meds.  Pt d/w Dr. Alvy Bimler (oncology) who recommends decadron 4 mg IV q 6 hrs.  She will see him in the morning.  Admit to medicine.  Pt d/w Dr. Posey Pronto (triad) for admission.   Final Clinical Impression(s) / ED Diagnoses Final diagnoses:  Brain metastasis ([place])  Malignant melanoma, unspecified site ([place])  Vasogenic brain edema ([place])  Altered mental status, unspecified altered mental status type    Rx / DC Orders ED Discharge Orders    None       Isla Pence, MD 10/20/20 Kathyrn Drown

## 2020-10-20 NOTE — H&P (Signed)
History and Physical    Sean [name] LTJ:[id number] DOB: [birth year] DOA: 10/20/2020  PCP: Deland Pretty, MD  Patient coming from: Home via EMS  I have personally briefly reviewed patient's old medical records in [place]  Chief Complaint: Right-sided weakness and aphasia  HPI: Sean [name] is a 55 y.o. male with medical history significant for malignant melanoma with metastases to the brain s/p cycle 1 ipilimumab/nivolumab on 10/16/2020 who presents to the ED for evaluation of left-sided weakness, aphasia, fatigue, intermittent confusion, and decrease in ability to perform ADLs progressive over the last 4 days.  Wife is at bedside to provide a history.  Patient denies any current complaints and has some intermittent confusion on admission.  Wife states that patient was previously on Decadron which was discontinued due to reported interaction with his chemotherapy plan.  ED Course:  Initial vitals showed BP 133/86, pulse 97 RR 20, temp 98.9 F, SPO2 93% on room air.  Labs show WBC 6.2, hemoglobin 15.0, platelets 200,000, sodium 140, potassium 3.9, bicarb 20, BUN 18, creatinine 0.74, serum glucose 104, AST 27, ALT 48, alk phos 110, total bilirubin 1.5.  Lactic acid 0.9.  SARS-CoV-2 PCR is negative.  Blood cultures obtained and pending.  2 view chest x-ray shows streaky left basilar opacity favoring atelectasis.  CT head without contrast shows significant interval increase in number and size of numerous infratentorial and supratentorial hemorrhagic metastases with exuberant edema.  Resulting effacement of the right lateral ventricle noted.  EDP discussed with on-call oncology who recommended starting IV Decadron 4 mg every 6 hours.  Patient also given 1 L normal saline.  The hospitalist service was consulted to admit for further evaluation and management.  Review of Systems: All systems reviewed and are negative except as documented in history of present illness above.   Past  Medical History:  Diagnosis Date  . Brain lesion 09/14/2020  . Hyperlipidemia   . Liver lesion 09/14/2020    Past Surgical History:  Procedure Laterality Date  . APPLICATION OF CRANIAL NAVIGATION  09/29/2020   Procedure: APPLICATION OF CRANIAL NAVIGATION;  Surgeon: Judith Part, MD;  Location: [facility];  Service: Neurosurgery;;  . Kyla Balzarine Left 09/29/2020   Procedure: Left craniotomy for tumor resection with brainlab;  Surgeon: Judith Part, MD;  Location: [facility];  Service: Neurosurgery;  Laterality: Left;  . HERNIA REPAIR     left side inguinal hernia about 18-20 years    Social History:  reports that he has quit smoking. He has never used smokeless tobacco. He reports current alcohol use. He reports that he does not use drugs.  No Known Allergies  Family History  Problem Relation Age of Onset  . Osteoarthritis Mother   . Hypertension Father   . Colon polyps Father   . Colon polyps Brother   . Colon polyps Brother   . Colon cancer Neg Hx   . Stomach cancer Neg Hx   . Rectal cancer Neg Hx   . Esophageal cancer Neg Hx   . Liver cancer Neg Hx      Prior to Admission medications   Medication Sig Start Date End Date Taking? Authorizing Provider  acetaminophen (TYLENOL) 500 MG tablet Take 500 mg by mouth every 6 (six) hours as needed for mild pain.    [provider]  HYDROcodone-acetaminophen (NORCO/VICODIN) 5-325 MG tablet Take 1 tablet by mouth every 6 (six) hours as needed for moderate pain. 10/16/20   Owens Shark, NP  levETIRAcetam (KEPPRA) 500  MG tablet Take 1 tablet (500 mg total) by mouth 2 (two) times daily. 10/16/20   Owens Shark, NP  Multiple Vitamin (MULTIVITAMIN WITH MINERALS) TABS tablet Take 1 tablet by mouth daily. 09/19/20   Geradine Girt, DO  ondansetron (ZOFRAN) 4 MG tablet Take 1 tablet (4 mg total) by mouth daily as needed for nausea or vomiting. 10/16/20 10/16/21  Owens Shark, NP  pantoprazole (PROTONIX) 40 MG tablet Take 1 tablet  (40 mg total) by mouth daily at 12 noon. 10/16/20   Owens Shark, NP    Physical Exam: Vitals:   10/20/20 1730 10/20/20 1800 10/20/20 1840 10/20/20 1900  BP: 132/87  (!) 140/92 120/87  Pulse: 93 91 92 97  Resp: (!) 21 (!) 25 18 (!) 23  Temp:      TempSrc:      SpO2: 97% 96% 94% 93%  Weight:      Height:       Constitutional: Resting in bed, NAD, calm, comfortable Eyes: PERRL, lids and conjunctivae normal ENMT: Mucous membranes are moist. Posterior pharynx clear of any exudate or lesions.Normal dentition.  Neck: normal, supple, no masses. Respiratory: clear to auscultation bilaterally, no wheezing, no crackles. Normal respiratory effort. No accessory muscle use.  Cardiovascular: Regular rate and rhythm, no murmurs / rubs / gallops. No extremity edema. 2+ pedal pulses. Abdomen: no tenderness, no masses palpated. No hepatosplenomegaly. Bowel sounds positive.  Musculoskeletal: no clubbing / cyanosis. No joint deformity upper and lower extremities. Good ROM, no contractures. Normal muscle tone.  Skin: No ulcers. No induration Neurologic: CN 2-12 grossly intact. Sensation intact. Strength 4/5 left upper extremity otherwise 5/5 in all other extremities. Psychiatric: Awake, alert, and oriented to self, place, and wife at bedside but not oriented to situation.  Labs on Admission: I have personally reviewed following labs and imaging studies  CBC: Recent Labs  Lab 10/16/20 1003 10/20/20 1729  WBC 7.0 6.2  NEUTROABS 5.4 4.7  HGB 15.6 15.0  HCT 45.2 41.6  MCV 89.9 89.3  PLT 195 237   Basic Metabolic Panel: Recent Labs  Lab 10/16/20 1003 10/20/20 1729  NA 141 140  K 3.9 3.9  CL 105 107  CO2 25 20*  GLUCOSE 124* 104*  BUN 15 18  CREATININE 0.80 0.74  CALCIUM 9.7 9.0   GFR: Estimated Creatinine Clearance: 109.8 mL/min (by C-G formula based on SCr of 0.74 mg/dL). Liver Function Tests: Recent Labs  Lab 10/16/20 1003 10/20/20 1729  AST 26 27  ALT 72* 48*  ALKPHOS 105  110  BILITOT 1.0 1.5*  PROT 7.4 6.6  ALBUMIN 3.7 3.3*   No results for input(s): LIPASE, AMYLASE in the last 168 hours. No results for input(s): AMMONIA in the last 168 hours. Coagulation Profile: No results for input(s): INR, PROTIME in the last 168 hours. Cardiac Enzymes: No results for input(s): CKTOTAL, CKMB, CKMBINDEX, TROPONINI in the last 168 hours. BNP (last 3 results) No results for input(s): PROBNP in the last 8760 hours. HbA1C: No results for input(s): HGBA1C in the last 72 hours. CBG: No results for input(s): GLUCAP in the last 168 hours. Lipid Profile: No results for input(s): CHOL, HDL, LDLCALC, TRIG, CHOLHDL, LDLDIRECT in the last 72 hours. Thyroid Function Tests: No results for input(s): TSH, T4TOTAL, FREET4, T3FREE, THYROIDAB in the last 72 hours. Anemia Panel: No results for input(s): VITAMINB12, FOLATE, FERRITIN, TIBC, IRON, RETICCTPCT in the last 72 hours. Urine analysis:    Component Value Date/Time   COLORURINE YELLOW  09/15/2020 0155   APPEARANCEUR CLEAR 09/15/2020 0155   LABSPEC 1.006 09/15/2020 0155   PHURINE 7.0 09/15/2020 0155   GLUCOSEU NEGATIVE 09/15/2020 0155   HGBUR MODERATE (A) 09/15/2020 0155   BILIRUBINUR NEGATIVE 09/15/2020 0155   KETONESUR NEGATIVE 09/15/2020 0155   PROTEINUR NEGATIVE 09/15/2020 0155   NITRITE NEGATIVE 09/15/2020 0155   LEUKOCYTESUR NEGATIVE 09/15/2020 0155    Radiological Exams on Admission: DG Chest 2 View  Result Date: 10/20/2020 CLINICAL DATA:  Altered mental status EXAM: CHEST - 2 VIEW COMPARISON:  PET-CT September 25, 2020 FINDINGS: The heart size and mediastinal contours are within normal limits. Streaky left basilar opacity not significantly changed from prior favor atelectasis. Known right upper lobe pulmonary nodule not visualized on today's exam. No pleural effusion. No pneumothorax. The visualized skeletal structures are unremarkable. IMPRESSION: Streaky left basilar opacity, favor atelectasis though developing  infection not excluded. Electronically Signed   By: Dahlia Bailiff MD   On: 10/20/2020 17:55   CT Head Wo Contrast  Result Date: 10/20/2020 CLINICAL DATA:  Mental status change, unknown cause. EXAM: CT HEAD WITHOUT CONTRAST TECHNIQUE: Contiguous axial images were obtained from the base of the skull through the vertex without intravenous contrast. COMPARISON:  MRI head 09/30/20 FINDINGS: Brain: Interval increase in number and size of numerous supratentorial hemorrhagic metastases. The largest lesion on the left measures up to 5 point 1 cm in the left frontal lobe. The largest lesion on the right measures up to 3.3 cm in the right frontal lobe. Of note, there is a hemorrhagic lesion in the right thalamus. There is exuberant vasogenic edema associated with these lesions. No substantial midline shift. There is significant effacement of the right lateral ventricle. No hydrocephalus. Infratentorially, there is an 8 mm hemorrhagic metastasis within the superior right cerebellum. Vascular: No hyperdense vessel identified. Skull: No acute fracture. Prior left frontal and left parietal craniotomy. Sinuses/Orbits: No acute findings. Other: No mastoid effusions. IMPRESSION: Significant interval increase in number and size of numerous infratentorial and supratentorial hemorrhagic metastases with exuberant edema, as detailed above. Resulting effacement of the right lateral ventricle. An MRI with contrast could provide more complete evaluation if clinically indicated. Findings discussed with Dr. Gilford Raid Via telephone at 6:32 PM. Electronically Signed   By: Margaretha Sheffield MD   On: 10/20/2020 18:35    EKG: Personally reviewed. Sinus tachycardia, rate 102.  Rate faster when compared to prior.  Assessment/Plan Principal Problem:   Brain metastasis (HCC) Active Problems:   Malignant melanoma metastatic to brain [facility])  Jonty Morrical is a 55 y.o. male with medical history significant for malignant melanoma with  metastases to the brain s/p cycle 1 ipilimumab/nivolumab on 10/16/2020 who is admitted with functional decline related to brain metastases.  Malignant melanoma with hemorrhagic brain metastases and edema: CT head without contrast 10/20/2020 shows significant interval increase in number and size of numerous hemorrhagic metastases of the brain with significant vasogenic edema.  Has had intermittent confusion, left-sided weakness, and functional decline over the last 4 days. -Oncology to see in a.m. -Continue IV Decadron 4 mg every 6 hours -PT/OT  DVT prophylaxis: SCDs Code Status: Full code, confirmed with patient Family Communication: Discussed with patient's wife at bedside Disposition Plan: From home, dispo pending clinical progress Consults called: Oncology Level of care: Telemetry Medical Admission status:  Status is: Inpatient  Remains inpatient appropriate because:Altered mental status, Unsafe d/c plan and Inpatient level of care appropriate due to severity of illness   Dispo: The patient is from: Home  Anticipated d/c is to: Home              Anticipated d/c date is: 2 days              Patient currently is not medically stable to d/c.    Zada Finders MD [facility]  If 7PM-7AM, please contact night-coverage [email or web address]  10/20/2020, 7:29 PM

## 2020-10-21 DIAGNOSIS — C7931 Secondary malignant neoplasm of brain: Principal | ICD-10-CM

## 2020-10-21 LAB — BASIC METABOLIC PANEL
Anion gap: 11 (ref 5–15)
BUN: 17 mg/dL (ref 6–20)
CO2: 22 mmol/L (ref 22–32)
Calcium: 8.9 mg/dL (ref 8.9–10.3)
Chloride: 106 mmol/L (ref 98–111)
Creatinine, Ser: 0.68 mg/dL (ref 0.61–1.24)
GFR, Estimated: >60 mL/min (ref >60)
Glucose, Bld: 144 mg/dL — ABNORMAL HIGH (ref 70–99)
Potassium: 4 mmol/L (ref 3.5–5.1)
Sodium: 139 mmol/L (ref 135–145)

## 2020-10-21 LAB — URINALYSIS, ROUTINE W REFLEX MICROSCOPIC
Bilirubin Urine: NEGATIVE
Glucose, UA: NEGATIVE mg/dL
Hgb urine dipstick: NEGATIVE
Ketones, ur: 5 mg/dL — AB
Leukocytes,Ua: NEGATIVE
Nitrite: NEGATIVE
Protein, ur: NEGATIVE mg/dL
Specific Gravity, Urine: 1.028 (ref 1.005–1.030)
pH: 6 (ref 5.0–8.0)

## 2020-10-21 LAB — CBC
HCT: 40.1 % (ref 39.0–52.0)
Hemoglobin: 14 g/dL (ref 13.0–17.0)
MCH: 30.9 pg (ref 26.0–34.0)
MCHC: 34.9 g/dL (ref 30.0–36.0)
MCV: 88.5 fL (ref 80.0–100.0)
Platelets: 185 10*3/uL (ref 150–400)
RBC: 4.53 MIL/uL (ref 4.22–5.81)
RDW: 12.2 % (ref 11.5–15.5)
WBC: 6.2 10*3/uL (ref 4.0–10.5)
nRBC: 0 % (ref 0.0–0.2)

## 2020-10-21 MED ORDER — PANTOPRAZOLE SODIUM 40 MG PO TBEC
40.0000 mg | DELAYED_RELEASE_TABLET | Freq: Every day | ORAL | Status: DC
  Administered 2020-10-21 – 2020-10-26 (×6): 40 mg via ORAL
  Filled 2020-10-21 (×6): qty 1

## 2020-10-21 NOTE — Progress Notes (Signed)
Patient's wife states, "She was told by a MD that she could have two more family members visit in the patient's room but doesn't recall the MD's name".  Both Dr. Doristine Bosworth and Dr. Alvy Bimler paged and did not indicate additional visitors are appropriate at this time.  Visitor policy reviewed with the wife. Only one visitor for the duration of the stay.

## 2020-10-21 NOTE — Evaluation (Signed)
Physical Therapy Evaluation Patient Details Name: Sean [name] MRN: [id number] DOB: [birth year] Today's Date: 10/21/2020   History of Present Illness  Pt is a 55 y.o. male who presents with AMS, R side neglect and weakness, aphasia, and urinary incontinence since 1/31. Pt has hx of multiple brain mets and craniotomy with tumor resection on the L 1/14. 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 S/p cycle 1 ipilimumab/nivolumab 1/31 with pt stopping Decadron due to reported interaction with his chemotherapy plan. CT showed significant interval increase in number and size of numerous infratentorial and supratentorial hemorrhagic metastases with significant vasogenic edema.  Resulting effacement of the right lateral ventricle noted. PMH of liver lesion and hyperlipidemia.  Clinical Impression  Pt presents with condition above and deficits mentioned below, see PT Problem List. PTA, he was independent with all functional mobility without AD/AE. Between his wife, family members, and neighbors he can have 24/7 support as needed at home. Pt currently is functioning significantly below his baseline and is at high risk for falls secondary to his impaired cognition, L leg weakness, balance deficits, L side neglect, and L leg dysdiadochokinesia coordination issues. Pt currently requires extra time to process and respond to majority of cues and even needs physical initiation from PT at times. He benefits from being directed in quiet environments as he is easily distracted. Pt would greatly benefit from continued skilled acute PT services and intensive therapies at d/c in the CIR setting to maximize his independence and safety with all functional mobility.    Follow Up Recommendations CIR    Equipment Recommendations  Rolling walker with 5" wheels;3in1 (PT);Hospital bed;Wheelchair (measurements PT);Wheelchair cushion (measurements PT) (may change with progress)    Recommendations for Other Services  Rehab consult     Precautions / Restrictions Precautions Precautions: Fall Precaution Comments: L neglect Restrictions Weight Bearing Restrictions: No      Mobility  Bed Mobility Overal bed mobility: Needs Assistance Bed Mobility: Supine to Sit;Sit to Supine     Supine to sit: HOB elevated;Mod assist Sit to supine: Mod assist   General bed mobility comments: Extra time and hand-over-hand to direct pt's R hand to L bed rail to pull up on to ascend trunk. Cues to manage legs off bed, ultimately needing modA to complete management of legs and trunk. ModA to initiate and complete return to supine with cues to lean onto L elbow.    Transfers Overall transfer level: Needs assistance Equipment used: Rolling walker (2 wheeled) Transfers: Sit to/from [facility] Sit to Stand: Min assist Stand pivot transfers: Mod assist       General transfer comment: Sit to stand from EOB 1x and commode 1x with minA and extra time to power up and gain balance in standing. Repeated hand-over-hand cues for hand placement to push up to stand. ModA to direct pt towards R for stand step to commode from EOB, with pt neglecting L leg and crossing it over his R at a point.  Ambulation/Gait Ambulation/Gait assistance: Mod assist Gait Distance (Feet): 60 Feet Assistive device: Rolling walker (2 wheeled) Gait Pattern/deviations: Step-through pattern;Decreased step length - left;Decreased stride length;Decreased dorsiflexion - left;Staggering left Gait velocity: reduced Gait velocity interpretation: <1.8 ft/sec, indicate of risk for recurrent falls General Gait Details: Constant assistance at RW due to pt pushing it distal to his body despite cues to correct. Pt ambulates at slow pace with unsteadiness noted, drifting to L side of RW. Neglect of L side with L foot getting lateral to  RW leg on occasion and L hand releasing from RW grip. Decreased L step length and foot clearance. ModA for  management of RW and to maintain safety.  Stairs            Wheelchair Mobility    Modified Rankin (Stroke Patients Only) Modified Rankin (Stroke Patients Only) Pre-Morbid Rankin Score: No symptoms Modified Rankin: Moderately severe disability     Balance Overall balance assessment: Needs assistance Sitting-balance support: No upper extremity supported;Feet supported Sitting balance-Leahy Scale: Fair Sitting balance - Comments: Static sitting EOB with min guard for safety.   Standing balance support: Bilateral upper extremity supported;Single extremity supported;During functional activity Standing balance-Leahy Scale: Poor Standing balance comment: 1-2 UE support on RW with external assist                             Pertinent Vitals/Pain Pain Assessment: No/denies pain Pain Intervention(s): Monitored during session    Home Living Family/patient expects to be discharged to:: Private residence Living Arrangements: Spouse/significant other Available Help at Discharge: Family;Available 24 hours/day;Neighbor Type of Home: House Home Access: Stairs to enter Entrance Stairs-Rails: None Entrance Stairs-Number of Steps: 2 Home Layout: One level Home Equipment: None Additional Comments: 2 dogs    Prior Function Level of Independence: Independent         Comments: Paediatric nurse, 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     Hand Dominance   Dominant Hand: Left    Extremity/Trunk Assessment   Upper Extremity Assessment Upper Extremity Assessment: Defer to OT evaluation    Lower Extremity Assessment Lower Extremity Assessment: RLE deficits/detail;LLE deficits/detail RLE Deficits / Details: MMT scores of 4 to 5 grossly throughout RLE Sensation: WNL (unable to assess dynamic proprioception due to poor attention span and following commands) RLE Coordination: decreased gross motor LLE Deficits / Details: MMT scores of 3+ to 4  grossly throughout LLE Sensation: WNL (unable to assess dynamic proprioception due to poor attention span and following commands) LLE Coordination: decreased fine motor;decreased gross motor (dysdiadochokinesia noted)    Cervical / Trunk Assessment Cervical / Trunk Assessment: Normal  Communication   Communication: No difficulties  Cognition Arousal/Alertness: Awake/alert Behavior During Therapy: WFL for tasks assessed/performed Overall Cognitive Status: Impaired/Different from baseline Area of Impairment: Attention;Memory;Following commands;Safety/judgement;Awareness;Problem solving                   Current Attention Level: Selective Memory: Decreased short-term memory;Decreased recall of precautions Following Commands: Follows one step commands inconsistently;Follows one step commands with increased time Safety/Judgement: Decreased awareness of safety;Decreased awareness of deficits Awareness: Intellectual Problem Solving: Decreased initiation;Slow processing;Difficulty sequencing;Requires verbal cues;Requires tactile cues General Comments: Pt easily distracted and benefits from quiet environment. Simple multi-modal cues repeated throughout with pt needing physical guidance to initiate tasks and sequence often. Slow processing. Pt reaching out for "arrow" in mid air at one point, possible hallucination? Neglect of L side and poor safety awareness.      General Comments      Exercises     Assessment/Plan    PT Assessment Patient needs continued PT services  PT Problem List Decreased strength;Decreased activity tolerance;Decreased balance;Decreased mobility;Decreased coordination;Decreased cognition;Decreased knowledge of use of DME;Decreased safety awareness;Decreased knowledge of precautions       PT Treatment Interventions DME instruction;Gait training;Stair training;Functional mobility training;Therapeutic activities;Therapeutic exercise;Balance training;Neuromuscular  re-education;Cognitive remediation;Patient/family education    PT Goals (Current goals can be found in the Care Plan section)  Acute Rehab PT Goals Patient Stated Goal: to improve PT Goal Formulation: With patient/family Time For Goal Achievement: 11/04/20 Potential to Achieve Goals: Good    Frequency Min 4X/week   Barriers to discharge        Co-evaluation               AM-PAC PT "6 Clicks" Mobility  Outcome Measure Help needed turning from your back to your side while in a flat bed without using bedrails?: A Lot Help needed moving from lying on your back to sitting on the side of a flat bed without using bedrails?: A Lot Help needed moving to and from a bed to a chair (including a wheelchair)?: A Lot Help needed standing up from a chair using your arms (e.g., wheelchair or bedside chair)?: A Little Help needed to walk in hospital room?: A Lot Help needed climbing 3-5 steps with a railing? : A Lot 6 Click Score: 13    End of Session Equipment Utilized During Treatment: Gait belt Activity Tolerance: Patient tolerated treatment well Patient left: in bed;with call bell/phone within reach;with bed alarm set;with family/visitor present   PT Visit Diagnosis: Unsteadiness on feet (R26.81);Other abnormalities of gait and mobility (R26.89);Muscle weakness (generalized) (M62.81);Difficulty in walking, not elsewhere classified (R26.2);Other symptoms and signs involving the nervous system (R29.898);Hemiplegia and hemiparesis Hemiplegia - Right/Left: Left Hemiplegia - dominant/non-dominant: Dominant Hemiplegia - caused by:  (brain metastasis)    Time: [id number] PT Time Calculation (min) (ACUTE ONLY): 47 min   Charges:   PT Evaluation $PT Eval Moderate Complexity: 1 Mod PT Treatments $Gait Training: 8-22 mins $Therapeutic Activity: 8-22 mins        Moishe Spice, PT, DPT Acute Rehabilitation Services  Pager: [phone] Office: [phone]   Orvan Falconer 10/21/2020, 4:38 PM

## 2020-10-21 NOTE — Progress Notes (Signed)
PROGRESS NOTE    Sean [name]  VOP:[id number] DOB: 1966-02-09 DOA: 10/20/2020 PCP: Deland Pretty, MD   Brief Narrative:  HPI: Sean [name] is a 55 y.o. male with medical history significant for malignant melanoma with metastases to the brain s/p cycle 1 ipilimumab/nivolumab on 10/16/2020 who presents to the ED for evaluation of left-sided weakness, aphasia, fatigue, intermittent confusion, and decrease in ability to perform ADLs progressive over the last 4 days.  Wife is at bedside to provide a history.  Patient denies any current complaints and has some intermittent confusion on admission.  Wife states that patient was previously on Decadron which was discontinued due to reported interaction with his chemotherapy plan.  ED Course:  Initial vitals showed BP 133/86, pulse 97 RR 20, temp 98.9 F, SPO2 93% on room air.  Labs show WBC 6.2, hemoglobin 15.0, platelets 200,000, sodium 140, potassium 3.9, bicarb 20, BUN 18, creatinine 0.74, serum glucose 104, AST 27, ALT 48, alk phos 110, total bilirubin 1.5.  Lactic acid 0.9.  SARS-CoV-2 PCR is negative.  Blood cultures obtained and pending.  2 view chest x-ray shows streaky left basilar opacity favoring atelectasis.  CT head without contrast shows significant interval increase in number and size of numerous infratentorial and supratentorial hemorrhagic metastases with exuberant edema.  Resulting effacement of the right lateral ventricle noted.  EDP discussed with on-call oncology who recommended starting IV Decadron 4 mg every 6 hours.  Patient also given 1 L normal saline.  The hospitalist service was consulted to admit for further evaluation and management.  Assessment & Plan:   Principal Problem:   Brain metastasis ([place]) Active Problems:   Malignant melanoma metastatic to brain ([place])  Acute metabolic encephalopathy and left-sided weakness secondary to malignant melanoma with hemorrhagic brain metastasis and vasogenic edema: Case  was discussed with oncology who is going to see today.  Patient remains on dexamethasone per their recommendations.  Wife at the bedside, patient still confused but alert.  Still with some weakness on the left side of the body.  Will be seen by PT OT.  Will start on PPI to prevent stress ulcer.  DVT prophylaxis: SCDs Start: 10/20/20 2105   Code Status: Full Code  Family Communication: Discussed with wife at the bedside  Status is: Inpatient  Remains inpatient appropriate because:Inpatient level of care appropriate due to severity of illness   Dispo: The patient is from: Home              Anticipated d/c is to: Home              Anticipated d/c date is: 2 days              Patient currently is not medically stable to d/c.   Difficult to place patient No        Estimated body mass index is 27.67 kg/m as calculated from the following:   Height as of this encounter: _0  (1.803 m).   Weight as of this encounter: 90 kg.      Nutritional status:               Consultants:   Oncology  Procedures:   None  Antimicrobials:  Anti-infectives (From admission, onward)   None         Subjective: Seen and examined.  Wife at the bedside.  Patient alert but pleasantly confused.  Has no complaint.  Objective: Vitals:   10/21/20 0431 10/21/20 0502 10/21/20 0545 10/21/20 0736  BP: 110/79 Marland Kitchen)  120/92 132/73 126/71  Pulse: 71 89 76 69  Resp: _0 Temp: 97.8 F (36.6 C) 98.6 F (37 C) 98.5 F (36.9 C) 98.3 F (36.8 C)  TempSrc: Oral Oral Oral Oral  SpO2: 93% 95% 95% 96%  Weight:      Height:       No intake or output data in the 24 hours ending 10/21/20 0949 Filed Weights   10/20/20 1701  Weight: 90 kg    Examination:  General exam: Appears calm and comfortable  Respiratory system: Clear to auscultation. Respiratory effort normal. Cardiovascular system: S1 & S2 heard, RRR. No JVD, murmurs, rubs, gallops or clicks. No pedal  edema. Gastrointestinal system: Abdomen is nondistended, soft and nontender. No organomegaly or masses felt. Normal bowel sounds heard. Central nervous system: Alert but disoriented.  Slight weakness on the left upper extremity and left lower extremity. Extremities: Symmetric 5 x 5 power. Skin: No rashes, lesions or ulcers Psychiatry: Judgement and insight appear poor   Data Reviewed: I have personally reviewed following labs and imaging studies  CBC: Recent Labs  Lab 10/16/20 1003 10/20/20 1729 10/21/20 0610  WBC 7.0 6.2 6.2  NEUTROABS 5.4 4.7  --   HGB 15.6 15.0 14.0  HCT 45.2 41.6 40.1  MCV 89.9 89.3 88.5  PLT 195 200 585   Basic Metabolic Panel: Recent Labs  Lab 10/16/20 1003 10/20/20 1729 10/21/20 0610  NA 141 140 139  K 3.9 3.9 4.0  CL 105 107 106  CO2 25 20* 22  GLUCOSE 124* 104* 144*  BUN _1 CREATININE 0.80 0.74 0.68  CALCIUM 9.7 9.0 8.9   GFR: Estimated Creatinine Clearance: 109.8 mL/min (by C-G formula based on SCr of 0.68 mg/dL). Liver Function Tests: Recent Labs  Lab 10/16/20 1003 10/20/20 1729  AST 26 27  ALT 72* 48*  ALKPHOS 105 110  BILITOT 1.0 1.5*  PROT 7.4 6.6  ALBUMIN 3.7 3.3*   No results for input(s): LIPASE, AMYLASE in the last 168 hours. No results for input(s): AMMONIA in the last 168 hours. Coagulation Profile: No results for input(s): INR, PROTIME in the last 168 hours. Cardiac Enzymes: No results for input(s): CKTOTAL, CKMB, CKMBINDEX, TROPONINI in the last 168 hours. BNP (last 3 results) No results for input(s): PROBNP in the last 8760 hours. HbA1C: No results for input(s): HGBA1C in the last 72 hours. CBG: No results for input(s): GLUCAP in the last 168 hours. Lipid Profile: No results for input(s): CHOL, HDL, LDLCALC, TRIG, CHOLHDL, LDLDIRECT in the last 72 hours. Thyroid Function Tests: No results for input(s): TSH, T4TOTAL, FREET4, T3FREE, THYROIDAB in the last 72 hours. Anemia Panel: No results for input(s):  VITAMINB12, FOLATE, FERRITIN, TIBC, IRON, RETICCTPCT in the last 72 hours. Sepsis Labs: Recent Labs  Lab 10/20/20 1911  LATICACIDVEN 0.9    Recent Results (from the past 240 hour(s))  SARS Coronavirus 2 by RT PCR (hospital order, performed in [facility] hospital lab) Nasopharyngeal Nasopharyngeal Swab     Status: None   Collection Time: 10/20/20  6:08 PM   Specimen: Nasopharyngeal Swab  Result Value Ref Range Status   SARS Coronavirus 2 NEGATIVE NEGATIVE Final    Comment: (NOTE) SARS-CoV-2 target nucleic acids are NOT DETECTED.  The SARS-CoV-2 RNA is generally detectable in upper and lower respiratory specimens during the acute phase of infection. The lowest concentration of SARS-CoV-2 viral copies this assay can detect is 250 copies / mL. A negative result does not preclude  SARS-CoV-2 infection and should not be used as the sole basis for treatment or other patient management decisions.  A negative result may occur with improper specimen collection / handling, submission of specimen other than nasopharyngeal swab, presence of viral mutation(s) within the areas targeted by this assay, and inadequate number of viral copies (<250 copies / mL). A negative result must be combined with clinical observations, patient history, and epidemiological information.  Fact Sheet for Patients:   StrictlyIdeas.no  Fact Sheet for Healthcare Providers: BankingDealers.co.za  This test is not yet approved or  cleared by the [place] FDA and has been authorized for detection and/or diagnosis of SARS-CoV-2 by FDA under an Emergency Use Authorization (EUA).  This EUA will remain in effect (meaning this test can be used) for the duration of the COVID-19 declaration under Section 564(b)(1) of the Act, 21 U.S.C. section 360bbb-3(b)(1), unless the authorization is terminated or revoked sooner.  Performed at [facility], [place] [address]  [place], [place] [address]       Radiology Studies: DG Chest 2 View  Result Date: 10/20/2020 CLINICAL DATA:  Altered mental status EXAM: CHEST - 2 VIEW COMPARISON:  PET-CT September 25, 2020 FINDINGS: The heart size and mediastinal contours are within normal limits. Streaky left basilar opacity not significantly changed from prior favor atelectasis. Known right upper lobe pulmonary nodule not visualized on today's exam. No pleural effusion. No pneumothorax. The visualized skeletal structures are unremarkable. IMPRESSION: Streaky left basilar opacity, favor atelectasis though developing infection not excluded. Electronically Signed   By: Dahlia Bailiff MD   On: 10/20/2020 17:55   CT Head Wo Contrast  Result Date: 10/20/2020 CLINICAL DATA:  Mental status change, unknown cause. EXAM: CT HEAD WITHOUT CONTRAST TECHNIQUE: Contiguous axial images were obtained from the base of the skull through the vertex without intravenous contrast. COMPARISON:  MRI head 09/30/20 FINDINGS: Brain: Interval increase in number and size of numerous supratentorial hemorrhagic metastases. The largest lesion on the left measures up to 5 point 1 cm in the left frontal lobe. The largest lesion on the right measures up to 3.3 cm in the right frontal lobe. Of note, there is a hemorrhagic lesion in the right thalamus. There is exuberant vasogenic edema associated with these lesions. No substantial midline shift. There is significant effacement of the right lateral ventricle. No hydrocephalus. Infratentorially, there is an 8 mm hemorrhagic metastasis within the superior right cerebellum. Vascular: No hyperdense vessel identified. Skull: No acute fracture. Prior left frontal and left parietal craniotomy. Sinuses/Orbits: No acute findings. Other: No mastoid effusions. IMPRESSION: Significant interval increase in number and size of numerous infratentorial and supratentorial hemorrhagic metastases with exuberant edema, as detailed above. Resulting  effacement of the right lateral ventricle. An MRI with contrast could provide more complete evaluation if clinically indicated. Findings discussed with Dr. Gilford Raid Via telephone at 6:32 PM. Electronically Signed   By: Margaretha Sheffield MD   On: 10/20/2020 18:35    Scheduled Meds: . dexamethasone (DECADRON) injection  4 mg Intravenous Q6H  . sodium chloride flush  3 mL Intravenous Q12H   Continuous Infusions:   LOS: 1 day   Time spent: 30 minutes   Darliss Cheney, MD [facility]  10/21/2020, 9:49 AM   To contact the attending provider between 7A-7P or the covering provider during after hours 7P-7A, please log into the web site [email or web address].

## 2020-10-21 NOTE — Evaluation (Signed)
Occupational Therapy Evaluation Patient Details Name: Sean [name] MRN: [id number] DOB: Jul 08, 1966 Today's Date: 10/21/2020    History of Present Illness Pt is a 55 y.o. male who presents with AMS, R side neglect and weakness, aphasia, and urinary incontinence since 1/31. Pt has hx of multiple brain mets and craniotomy with tumor resection on the L 1/14. 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 S/p cycle 1 ipilimumab/nivolumab 1/31 with pt stopping Decadron due to reported interaction with his chemotherapy plan. CT showed significant interval increase in number and size of numerous infratentorial and supratentorial hemorrhagic metastases with significant vasogenic edema.  Resulting effacement of the right lateral ventricle noted. PMH of liver lesion and hyperlipidemia.   Clinical Impression   Pt with recent dc from [facility] at mod I level, typically Sean [name] is independent in ADL and functional transfers. Today he displays deficits in cognition, vision, balance, LUE coordination and inattention. Pt's wife Sean [name] present and very helpful throughout session. Pt requires skilled OT in the acute setting as well as afterwards at the CIR level to maximize safety and independence in ADL and functional transfers as well as a great place for his wife to get education on how to be a caregiver and assist her husband. Next session to do functional visional assessment in conjunction with more complex BUE ADL.    Follow Up Recommendations  CIR    Equipment Recommendations  3 in 1 bedside commode;Other (comment) (RW, defer to next venue of care)    Recommendations for Other Services Rehab consult     Precautions / Restrictions Precautions Precautions: Fall Precaution Comments: L neglect Restrictions Weight Bearing Restrictions: No      Mobility Bed Mobility Overal bed mobility: Needs Assistance Bed Mobility: Sit to Supine;Rolling Rolling: Min assist   Supine to sit: HOB elevated;Mod  assist Sit to supine: Mod assist   General bed mobility comments: Pt sitting EOB when OT arrived, mod A to sequence legs and manage trunk down safely, able to roll for fresh pad placement with min A and cues for sequencing    Transfers Overall transfer level: Needs assistance Equipment used: 1 person hand held assist Transfers: Sit to/from [facility] Sit to Stand: Min assist Stand pivot transfers: Min assist       General transfer comment: min A to power up from bed and BSC, "dancing" motion used by therapist to engage BLE in transfer back to bed.    Balance Overall balance assessment: Needs assistance Sitting-balance support: No upper extremity supported;Feet supported Sitting balance-Leahy Scale: Fair Sitting balance - Comments: Static sitting EOB with min guard for safety.   Standing balance support: Bilateral upper extremity supported;Single extremity supported;During functional activity Standing balance-Leahy Scale: Poor Standing balance comment: 1 person HHA for standing balance during peri care                           ADL either performed or assessed with clinical judgement   ADL Overall ADL's : Needs assistance/impaired Eating/Feeding: Moderate assistance;With caregiver independent assisting;Sitting   Grooming: Sitting;Wash/dry hands;Minimal assistance   Upper Body Bathing: Moderate assistance;Sitting   Lower Body Bathing: Moderate assistance;Sitting/lateral leans   Upper Body Dressing : Moderate assistance;Sitting   Lower Body Dressing: Maximal assistance;Sit to/from stand   Toilet Transfer: Minimal Production assistant, radio Details (indicate cue type and reason): one person HHA Toileting- Clothing Manipulation and Hygiene: Moderate assistance;+2 for safety/equipment;Sit to/from stand Toileting - Clothing Manipulation Details (indicate cue type and  reason): Pt able to stand with OT and wife performing rear peri  care     Functional mobility during ADLs: Moderate assistance;Cueing for sequencing (1 person HHA, SPT from bed>BSC>bed) General ADL Comments: Pt with deficits in coordination, cognition, vision, expressive language?, balance     Vision   Vision Assessment?: Vision impaired- to be further tested in functional context Eye Alignment: Within Functional Limits Ocular Range of Motion: Within Functional Limits (able to track within all quadrants) Alignment/Gaze Preference: Within Defined Limits Tracking/Visual Pursuits: Able to track stimulus in all quads without difficulty Additional Comments: Pt unable to read nurse call bell, see colors, or decifer how many fingers this session. He tracks well and his gaze is midline but his vision is not functional during evaluation     Perception     Praxis      Pertinent Vitals/Pain Pain Assessment: No/denies pain Pain Intervention(s): Monitored during session;Repositioned     Hand Dominance Left   Extremity/Trunk Assessment Upper Extremity Assessment Upper Extremity Assessment: RUE deficits/detail;LUE deficits/detail;Generalized weakness RUE Deficits / Details: generalized weakness, able to perform finger to thumb, supination/pronation RUE Sensation: WNL RUE Coordination:  (increased time) LUE Deficits / Details: generally 4/5 able to perform finger to thumb - harder on ring and pinky finger LUE Sensation: decreased proprioception LUE Coordination: decreased fine motor;decreased gross motor (increased time to perform movements)   Lower Extremity Assessment Lower Extremity Assessment: Defer to PT evaluation RLE Deficits / Details: MMT scores of 4 to 5 grossly throughout RLE Sensation: WNL (unable to assess dynamic proprioception due to poor attention span and following commands) RLE Coordination: decreased gross motor LLE Deficits / Details: MMT scores of 3+ to 4 grossly throughout LLE Sensation: WNL (unable to assess dynamic proprioception  due to poor attention span and following commands) LLE Coordination: decreased fine motor;decreased gross motor (dysdiadochokinesia noted)   Cervical / Trunk Assessment Cervical / Trunk Assessment: Normal   Communication Communication Communication: No difficulties   Cognition Arousal/Alertness: Awake/alert Behavior During Therapy: WFL for tasks assessed/performed Overall Cognitive Status: Impaired/Different from baseline Area of Impairment: Attention;Memory;Following commands;Safety/judgement;Awareness;Problem solving;Orientation                 Orientation Level: Place;Situation Current Attention Level: Sustained Memory: Decreased short-term memory;Decreased recall of precautions Following Commands: Follows one step commands inconsistently;Follows one step commands with increased time Safety/Judgement: Decreased awareness of safety;Decreased awareness of deficits Awareness: Intellectual Problem Solving: Decreased initiation;Slow processing;Difficulty sequencing;Requires verbal cues;Requires tactile cues General Comments: Pt oriented to self, and sometimes "joking" around with therapist but likely confusion as well. Slightly impulsive, but following commands with extra time. attempted to shake hands with L hand - vision impaired and requires further evaluation   General Comments  Pt's wife Sean [name] present throughout session, very sweet and helpful    Exercises     Shoulder Instructions      Home Living Family/patient expects to be discharged to:: Private residence Living Arrangements: Spouse/significant other Available Help at Discharge: Family;Available 24 hours/day;Neighbor Type of Home: House Home Access: Stairs to enter [employer] of Steps: 2 Entrance Stairs-Rails: None Home Layout: One level     Bathroom Shower/Tub: Teacher, early years/pre: Handicapped height     Home Equipment: None   Additional Comments: 2 dogs      Prior  Functioning/Environment Level of Independence: Independent        Comments: Paediatric nurse, 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        OT  Problem List: Decreased strength;Decreased activity tolerance;Impaired balance (sitting and/or standing);Impaired vision/perception;Decreased coordination;Decreased cognition;Decreased safety awareness;Decreased knowledge of use of DME or AE;Decreased knowledge of precautions;Impaired UE functional use      OT Treatment/Interventions: Self-care/ADL training;Therapeutic exercise;Neuromuscular education;Energy conservation;DME and/or AE instruction;Manual therapy;Therapeutic activities;Cognitive remediation/compensation;Visual/perceptual remediation/compensation;Patient/family education;Balance training    OT Goals(Current goals can be found in the care plan section) Acute Rehab OT Goals Patient Stated Goal: to improve OT Goal Formulation: With patient/family Time For Goal Achievement: 11/04/20 Potential to Achieve Goals: Good ADL Goals Pt Will Perform Grooming: with set-up;sitting Pt Will Perform Upper Body Dressing: with min guard assist;with caregiver independent in assisting;sitting Pt Will Perform Lower Body Dressing: with min guard assist;sit to/from stand Pt Will Transfer to Toilet: with supervision;ambulating Pt Will Perform Toileting - Clothing Manipulation and hygiene: with supervision;sitting/lateral leans Additional ADL Goal #1: Pt will verbalize low vision compensatory strategies for completing ADL with no cues  OT Frequency: Min 2X/week   Barriers to D/C:            Co-evaluation              AM-PAC OT "6 Clicks" Daily Activity     Outcome Measure Help from another person eating meals?: A Lot Help from another person taking care of personal grooming?: A Lot Help from another person toileting, which includes using toliet, bedpan, or urinal?: A Lot Help from another person bathing (including washing,  rinsing, drying)?: A Lot Help from another person to put on and taking off regular upper body clothing?: A Lot Help from another person to put on and taking off regular lower body clothing?: A Lot 6 Click Score: 12   End of Session Equipment Utilized During Treatment: Gait belt Nurse Communication: Mobility status  Activity Tolerance: Patient tolerated treatment well Patient left: in bed;with call bell/phone within reach;with family/visitor present;with bed alarm set;with SCD's reapplied  OT Visit Diagnosis: Unsteadiness on feet (R26.81);Other abnormalities of gait and mobility (R26.89);Muscle weakness (generalized) (M62.81);Low vision, both eyes (H54.2);Other symptoms and signs involving the nervous system (R29.898);Other symptoms and signs involving cognitive function                Time: [id number] OT Time Calculation (min): 35 min Charges:  OT General Charges $OT Visit: 1 Visit OT Evaluation $OT Eval Moderate Complexity: 1 Mod OT Treatments $Self Care/Home Management : 8-22 mins  Jesse Sans OTR/L Acute Rehabilitation Services Pager: [phone] Office: [place] 10/21/2020, 5:46 PM

## 2020-10-21 NOTE — Progress Notes (Signed)
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 [name] will resume care on Monday  Worsening neurological deficit with vasogenic edema noted on imaging studies Resume high-dose dexamethasone Since admission, his neurological symptoms are gradually improving I recommend he continue high-dose steroids over the weekend  Goals of care Improvement of neurological symptoms  Discharge planning I recommend keeping the patient over the weekend at least until Monday The patient is at risk of seizures and death if we discharged him prematurely  All questions were answered. The patient knows to call the clinic with any problems, questions or concerns.  Heath Lark, MD 10/21/2020 10:47 AM  Subjective:  I was notified by the emergency room physician about this patient who presented to the ER, transported by EMS due to worsening neurological symptoms.  I also spoke with his primary oncologist yesterday I reviewed his chart extensively The patient was weaned off steroids in anticipation for immunotherapy which he received on October 16, 2020 His wife provided most of his history The patient is noted to have altered mental status and progressive weakness especially on the left side He also noted some double vision recently In the emergency room, he underwent CT imaging of the head which I have reviewed which show worsening vasogenic edema and new intracranial metastasis compared with previous imaging studies There were no reported seizures The patient has urinary retention and has Foley catheter in situ His urinary retention and difficulties with defecation started several days ago  Objective:  Vitals:   10/21/20  0545 10/21/20 0736  BP: 132/73 126/71  Pulse: 76 69  Resp: 17 14  Temp: 98.5 F (36.9 C) 98.3 F (36.8 C)  SpO2: 95% 96%    No intake or output data in the 24 hours ending 10/21/20 1047  GENERAL:alert, no distress and comfortable SKIN: skin color, texture, turgor are normal, no rashes or significant lesions EYES: normal, Conjunctiva are pink and non-injected, sclera clear OROPHARYNX:no exudate, no erythema and lips, buccal mucosa, and tongue normal  NECK: supple, thyroid normal size, non-tender, without nodularity LYMPH:  no palpable lymphadenopathy in the cervical, axillary or inguinal LUNGS: clear to auscultation and percussion with normal breathing effort HEART: regular rate & rhythm and no murmurs and no lower extremity edema ABDOMEN:abdomen soft, non-tender and normal bowel sounds Musculoskeletal:no cyanosis of digits and no clubbing  NEURO: alert & oriented, but the patient thought it was January and not February.  Appreciable left-sided weakness on exam   Labs:  Recent Labs    09/28/20 1544 10/16/20 1003 10/20/20 1729 10/21/20 0610  NA 135 141 140 139  K 4.1 3.9 3.9 4.0  CL 97* 105 107 106  CO2 28 25 20* 22  GLUCOSE 110* 124* 104* 144*  BUN 27* 15 18 17   CREATININE 0.77 0.80 0.74 0.68  CALCIUM 9.2 9.7 9.0 8.9  GFRNONAA >60 >60 >60 >60  PROT 7.1 7.4 6.6  --   ALBUMIN 3.9 3.7 3.3*  --   AST 73* 26 27  --   ALT 683* 72* 48*  --   ALKPHOS 108 105 110  --   BILITOT 0.7 1.0 1.5*  --     Studies:  DG Chest 2 View  Result Date: 10/20/2020 CLINICAL DATA:  Altered  mental status EXAM: CHEST - 2 VIEW COMPARISON:  PET-CT September 25, 2020 FINDINGS: The heart size and mediastinal contours are within normal limits. Streaky left basilar opacity not significantly changed from prior favor atelectasis. Known right upper lobe pulmonary nodule not visualized on today's exam. No pleural effusion. No pneumothorax. The visualized skeletal structures are unremarkable. IMPRESSION:  Streaky left basilar opacity, favor atelectasis though developing infection not excluded. Electronically Signed   By: Dahlia Bailiff MD   On: 10/20/2020 17:55   CT Head Wo Contrast  Result Date: 10/20/2020 CLINICAL DATA:  Mental status change, unknown cause. EXAM: CT HEAD WITHOUT CONTRAST TECHNIQUE: Contiguous axial images were obtained from the base of the skull through the vertex without intravenous contrast. COMPARISON:  MRI head 09/30/20 FINDINGS: Brain: Interval increase in number and size of numerous supratentorial hemorrhagic metastases. The largest lesion on the left measures up to 5 point 1 cm in the left frontal lobe. The largest lesion on the right measures up to 3.3 cm in the right frontal lobe. Of note, there is a hemorrhagic lesion in the right thalamus. There is exuberant vasogenic edema associated with these lesions. No substantial midline shift. There is significant effacement of the right lateral ventricle. No hydrocephalus. Infratentorially, there is an 8 mm hemorrhagic metastasis within the superior right cerebellum. Vascular: No hyperdense vessel identified. Skull: No acute fracture. Prior left frontal and left parietal craniotomy. Sinuses/Orbits: No acute findings. Other: No mastoid effusions. IMPRESSION: Significant interval increase in number and size of numerous infratentorial and supratentorial hemorrhagic metastases with exuberant edema, as detailed above. Resulting effacement of the right lateral ventricle. An MRI with contrast could provide more complete evaluation if clinically indicated. Findings discussed with Dr. Gilford Raid Via telephone at 6:32 PM. Electronically Signed   By: Margaretha Sheffield MD   On: 10/20/2020 18:35   MR BRAIN W WO CONTRAST  Result Date: 09/30/2020 CLINICAL DATA:  Status post craniotomy for tumor section. EXAM: MRI HEAD WITHOUT AND WITH CONTRAST TECHNIQUE: Multiplanar, multiecho pulse sequences of the brain and surrounding structures were obtained without and  with intravenous contrast. CONTRAST:  2mL GADAVIST GADOBUTROL 1 MMOL/ML IV SOLN COMPARISON:  Brain MRI 09/18/2020 FINDINGS: Brain: There are numerous diffusion restricting lesions throughout both hemispheres, which have associated hemorrhage and areas of contrast enhancement. New or increased sized lesions are as follows: 1. Right occipital lobe, 7 mm, previously 5 mm, series 18, image 24 2. Right basal ganglia, 3 mm, series 18, image 31 3. Right thalamus, 8 mm, image 34, mild surrounding edema, intrinsic hyperintense T1-weighted signal 4. Left frontal operculum, enhancing component 5 mm surrounding area 17 mm, possibly previously present as a punctate lesion, image 35 5. Superior left occipital lobe, 14 mm, previously 10 mm, image 37, mild surrounding edema 6. Anterior right frontal lobe 3 mm, image 37, intrinsic hyperintense T1-weighted signal 7. Left frontal lobe, 6 mm, previously 4 mm, image 44, intrinsic hyperintense T1-weighted signal Unchanged or smaller lesions: 1. Superior right cerebellum, 3 mm, series 18, image 23 2. Dominant left occipital lesion has been resected. There is minimal residual contrast enhancement adjacent to the occipital horn of the left lateral ventricle, image 32, mild edema 3. Left frontal lobe lesion status post resection with no residual nodular contrast enhancement, image 37, mild edema 4. Superomedial right parietal lobe, 4 mm, image 42 5. Left parietal lobe, 3 mm, image 45 6. Punctate left parietal lobe, image 43 7. Paramedian superior right frontal lobe, 14 mm, image 49, mild edema with intrinsic  hyperintense T1-weighted signal Vascular: Normal flow voids. Skull and upper cervical spine: Left frontal and parietal craniotomies. Sinuses/Orbits: Negative. Other: None. IMPRESSION: 1. Status post resection of left frontal and occipital lesions with minimal residual contrast enhancement near the occipital horn of the left lateral ventricle. 2. 7 lesions that are new or larger. 3.  Remainder of lesions are unchanged or smaller. Electronically Signed   By: Ulyses Jarred M.D.   On: 09/30/2020 01:34   NM PET Image Initial (PI) Skull Base To Thigh  Result Date: 09/25/2020 CLINICAL DATA:  Initial treatment strategy for metastatic disease of unknown primary. EXAM: NUCLEAR MEDICINE PET SKULL BASE TO THIGH TECHNIQUE: 11.4 mCi F-18 FDG was injected intravenously. Full-ring PET imaging was performed from the skull base to thigh after the radiotracer. CT data was obtained and used for attenuation correction and anatomic localization. Fasting blood glucose: 83 mg/dl COMPARISON:  Chest abdomen pelvis CT 09/14/2020. FINDINGS: Mediastinal blood pool activity: SUV max 2.4 Liver activity: SUV max NA NECK: No hypermetabolic lymph nodes in the neck. Incidental CT findings: none CHEST: Focal hypermetabolism in the right hilum identified with SUV max = 4.8. No definite lymphadenopathy evident on noncontrast CT imaging today. No other suspicious mediastinal or hilar uptake. 10 mm anterior right upper lobe nodule on 87/3 shows low level hypermetabolism with SUV max = 2.0. Scattered additional tiny pulmonary nodules are too small to characterize by PET imaging. Incidental CT findings: Minimal dependent atelectasis bilaterally. ABDOMEN/PELVIS: Multiple hypermetabolic lesions are seen in the liver,. These appear to be capsular/subcapsular and no abnormality is evident on noncontrast CT imaging. Hypermetabolic lesion adjacent to the falciform ligament in segment III demonstrates SUV max = 6.5. Capsular/subcapsular lesion medial liver at the porta hepatis demonstrates SUV max = 10.4. A third focus of hypermetabolism is seen in inferior tip of the right liver, again with no CT correlate but SUV max = 7.9. No hypermetabolic lymphadenopathy in the abdomen or pelvis. No hypermetabolic abnormality in the pancreas or spleen. No adrenal hypermetabolism. Focal hypermetabolism identified in the right iliopsoas muscles of the  pelvis with SUV max = 5.7. No CT correlate. Focal hypermetabolism identified in the abductor muscles of the medial left thigh ( SUV max = 11.6) without underlying lesion by CT imaging. There is a focus of hypermetabolism musculature of the medial left thigh with SUV max = 5.7. Again, no abnormality on CT imaging at this location. Incidental CT findings: Atherosclerotic calcification noted in the abdominal aorta. SKELETON: Mottled uptake is identified in bony anatomy without definite evidence for bony metastases Incidental CT findings: none IMPRESSION: 1. Focal areas of hypermetabolism identified in the right hilum, multiple areas of the liver adjacent to the capsule, and in musculature of the pelvis and left thigh. No CT correlate in any of these locations on today's noncontrast CT imaging. Nevertheless, imaging features concerning for metastatic disease. Repeat imaging with IV contrast could be used to further evaluate as clinically warranted. 2. 10 mm anterior right upper lobe pulmonary nodule shows low level hypermetabolism. Additional tiny pulmonary nodules are too small to characterize on PET imaging. 3.  Aortic Atherosclerois (ICD10-170.0) Electronically Signed   By: Misty Stanley M.D.   On: 09/25/2020 14:49

## 2020-10-22 LAB — CBC WITH DIFFERENTIAL/PLATELET
Abs Immature Granulocytes: 0.16 10*3/uL — ABNORMAL HIGH (ref 0.00–0.07)
Basophils Absolute: 0 10*3/uL (ref 0.0–0.1)
Basophils Relative: 0 %
Eosinophils Absolute: 0 10*3/uL (ref 0.0–0.5)
Eosinophils Relative: 0 %
HCT: 38.2 % — ABNORMAL LOW (ref 39.0–52.0)
Hemoglobin: 13.8 g/dL (ref 13.0–17.0)
Immature Granulocytes: 2 %
Lymphocytes Relative: 11 %
Lymphs Abs: 0.9 10*3/uL (ref 0.7–4.0)
MCH: 31.7 pg (ref 26.0–34.0)
MCHC: 36.1 g/dL — ABNORMAL HIGH (ref 30.0–36.0)
MCV: 87.8 fL (ref 80.0–100.0)
Monocytes Absolute: 0.4 10*3/uL (ref 0.1–1.0)
Monocytes Relative: 5 %
Neutro Abs: 6.3 10*3/uL (ref 1.7–7.7)
Neutrophils Relative %: 82 %
Platelets: 207 10*3/uL (ref 150–400)
RBC: 4.35 MIL/uL (ref 4.22–5.81)
RDW: 12.2 % (ref 11.5–15.5)
Smear Review: ADEQUATE
WBC: 7.7 10*3/uL (ref 4.0–10.5)
nRBC: 0 % (ref 0.0–0.2)

## 2020-10-22 LAB — BASIC METABOLIC PANEL
Anion gap: 11 (ref 5–15)
BUN: 19 mg/dL (ref 6–20)
CO2: 23 mmol/L (ref 22–32)
Calcium: 9 mg/dL (ref 8.9–10.3)
Chloride: 105 mmol/L (ref 98–111)
Creatinine, Ser: 0.77 mg/dL (ref 0.61–1.24)
GFR, Estimated: >60 mL/min (ref >60)
Glucose, Bld: 167 mg/dL — ABNORMAL HIGH (ref 70–99)
Potassium: 4 mmol/L (ref 3.5–5.1)
Sodium: 139 mmol/L (ref 135–145)

## 2020-10-22 LAB — URINE CULTURE

## 2020-10-22 MED ORDER — MELATONIN 5 MG PO TABS
5.0000 mg | ORAL_TABLET | Freq: Every evening | ORAL | Status: DC | PRN
  Administered 2020-10-22 – 2020-10-25 (×5): 5 mg via ORAL
  Filled 2020-10-22 (×5): qty 1

## 2020-10-22 NOTE — Progress Notes (Signed)
PROGRESS NOTE    Sean [name]  [id number] DOB: 21-Jan-1966 DOA: 10/20/2020 PCP: Deland Pretty, MD   Brief Narrative:  Sean [name] is a 55 y.o. 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  Upon presentation to ED, he was hemodynamically stable.  All basic labs within normal range.  CT head without contrast showed significant interval increase in number and size of numerous infratentorial and supratentorial hemorrhagic metastases with exuberant edema.  Resulting effacement of the right lateral ventricle noted.  EDP discussed with on-call oncology who recommended starting IV Decadron 4 mg every 6 hours.  Patient also given 1 L normal saline and admitted to hospital service.  Oncology consulted.  Evaluated by PT OT who recommended CIR.  Assessment & Plan:   Principal Problem:   Brain metastasis ([place]) Active Problems:   Malignant melanoma metastatic to brain ([place])  Acute metabolic encephalopathy and left-sided weakness secondary to malignant melanoma with hemorrhagic brain metastasis and vasogenic edema: Patient more alert however he is slightly more confused today according to wife who is at the bedside.  Patient has regained some of his strength back and wife agrees with that.  He has no other complaint.  Oncology recommends continuing current steroids and keeping in the hospital until tomorrow.  Evaluated by PT OT who recommends CIR.  CIR following and working on insurance.  DVT prophylaxis: SCDs Start: 10/20/20 2105   Code Status: Full Code  Family Communication: Discussed with wife at the bedside  Status is: Inpatient  Remains inpatient appropriate because:Inpatient level of care  appropriate due to severity of illness   Dispo: The patient is from: Home              Anticipated d/c is to: Home              Anticipated d/c date is: 2 days              Patient currently is not medically stable to d/c.   Difficult to place patient No        Estimated body mass index is 27.67 kg/m as calculated from the following:   Height as of this encounter: 5\' 11"  (1.803 m).   Weight as of this encounter: 90 kg.      Nutritional status:               Consultants:   Oncology  Procedures:   None  Antimicrobials:  Anti-infectives (From admission, onward)   None         Subjective: Seen and examined.  His wife is at the bedside.  Patient is alert but slightly more confused than yesterday.  He has more strength on the left side of the body compared to yesterday.  No other complaint.  Objective: Vitals:   10/21/20 1944 10/21/20 2338 10/22/20 0425 10/22/20 0740  BP: 122/83 123/80 (!) 130/96 117/78  Pulse: 89 77 67 72  Resp: 17 16 17 16   Temp: 98.5 F (36.9 C) 98.1 F (36.7 C) 97.8 F (36.6 C) 97.6 F (36.4 C)  TempSrc: Oral Oral Oral Oral  SpO2: 94% 95% 96% 97%  Weight:      Height:        Intake/Output Summary (Last 24 hours) at 10/22/2020 0939 Last  data filed at 10/21/2020 2200 Gross per 24 hour  Intake 120 ml  Output 325 ml  Net -205 ml   Filed Weights   10/20/20 1701  Weight: 90 kg    Examination:  General exam: Appears calm and comfortable  Respiratory system: Clear to auscultation. Respiratory effort normal. Cardiovascular system: S1 & S2 heard, RRR. No JVD, murmurs, rubs, gallops or clicks. No pedal edema. Gastrointestinal system: Abdomen is nondistended, soft and nontender. No organomegaly or masses felt. Normal bowel sounds heard. Central nervous system: Alert but not oriented.  Slight weakness in the left upper arm Extremities: Symmetric 5 x 5 power. Skin: No rashes, lesions or ulcers.    Data Reviewed: I have  personally reviewed following labs and imaging studies  CBC: Recent Labs  Lab 10/16/20 1003 10/20/20 1729 10/21/20 0610 10/22/20 0219  WBC 7.0 6.2 6.2 7.7  NEUTROABS 5.4 4.7  --  6.3  HGB 15.6 15.0 14.0 13.8  HCT 45.2 41.6 40.1 38.2*  MCV 89.9 89.3 88.5 87.8  PLT 195 200 185 242   Basic Metabolic Panel: Recent Labs  Lab 10/16/20 1003 10/20/20 1729 10/21/20 0610 10/22/20 0219  NA 141 140 139 139  K 3.9 3.9 4.0 4.0  CL 105 107 106 105  CO2 25 20* 22 23  GLUCOSE 124* 104* 144* 167*  BUN 15 18 17 19   CREATININE 0.80 0.74 0.68 0.77  CALCIUM 9.7 9.0 8.9 9.0   GFR: Estimated Creatinine Clearance: 109.8 mL/min (by C-G formula based on SCr of 0.77 mg/dL). Liver Function Tests: Recent Labs  Lab 10/16/20 1003 10/20/20 1729  AST 26 27  ALT 72* 48*  ALKPHOS 105 110  BILITOT 1.0 1.5*  PROT 7.4 6.6  ALBUMIN 3.7 3.3*   No results for input(s): LIPASE, AMYLASE in the last 168 hours. No results for input(s): AMMONIA in the last 168 hours. Coagulation Profile: No results for input(s): INR, PROTIME in the last 168 hours. Cardiac Enzymes: No results for input(s): CKTOTAL, CKMB, CKMBINDEX, TROPONINI in the last 168 hours. BNP (last 3 results) No results for input(s): PROBNP in the last 8760 hours. HbA1C: No results for input(s): HGBA1C in the last 72 hours. CBG: No results for input(s): GLUCAP in the last 168 hours. Lipid Profile: No results for input(s): CHOL, HDL, LDLCALC, TRIG, CHOLHDL, LDLDIRECT in the last 72 hours. Thyroid Function Tests: No results for input(s): TSH, T4TOTAL, FREET4, T3FREE, THYROIDAB in the last 72 hours. Anemia Panel: No results for input(s): VITAMINB12, FOLATE, FERRITIN, TIBC, IRON, RETICCTPCT in the last 72 hours. Sepsis Labs: Recent Labs  Lab 10/20/20 1911  LATICACIDVEN 0.9    Recent Results (from the past 240 hour(s))  Culture, blood (routine x 2)     Status: None (Preliminary result)   Collection Time: 10/20/20  5:29 PM   Specimen:  BLOOD  Result Value Ref Range Status   Specimen Description BLOOD RIGHT ANTECUBITAL  Final   Special Requests   Final    BOTTLES DRAWN AEROBIC AND ANAEROBIC Blood Culture results may not be optimal due to an inadequate volume of blood received in culture bottles   Culture   Final    NO GROWTH < 24 HOURS Performed at [facility] [address], [place], [place] [address]    Report Status PENDING  Incomplete  Culture, blood (routine x 2)     Status: None (Preliminary result)   Collection Time: 10/20/20  5:29 PM   Specimen: BLOOD  Result Value Ref Range Status  Specimen Description BLOOD LEFT ANTECUBITAL  Final   Special Requests   Final    BOTTLES DRAWN AEROBIC AND ANAEROBIC Blood Culture results may not be optimal due to an inadequate volume of blood received in culture bottles   Culture   Final    NO GROWTH < 24 HOURS Performed at [facility] [address] [place], [place] [address]    Report Status PENDING  Incomplete  Urine culture     Status: Abnormal   Collection Time: 10/20/20  5:31 PM   Specimen: Urine, Random  Result Value Ref Range Status   Specimen Description URINE, RANDOM  Final   Special Requests   Final    NONE Performed at [facility], [place] [address], [place], [place] [address]    Culture MULTIPLE SPECIES PRESENT, SUGGEST RECOLLECTION (A)  Final   Report Status 10/22/2020 FINAL  Final  SARS Coronavirus 2 by RT PCR (hospital order, performed in [facility] hospital lab) Nasopharyngeal Nasopharyngeal Swab     Status: None   Collection Time: 10/20/20  6:08 PM   Specimen: Nasopharyngeal Swab  Result Value Ref Range Status   SARS Coronavirus 2 NEGATIVE NEGATIVE Final    Comment: (NOTE) SARS-CoV-2 target nucleic acids are NOT DETECTED.  The SARS-CoV-2 RNA is generally detectable in upper and lower respiratory specimens during the acute phase of infection. The lowest concentration of SARS-CoV-2 viral copies this assay can detect is  250 copies / mL. A negative result does not preclude SARS-CoV-2 infection and should not be used as the sole basis for treatment or other patient management decisions.  A negative result may occur with improper specimen collection / handling, submission of specimen other than nasopharyngeal swab, presence of viral mutation(s) within the areas targeted by this assay, and inadequate number of viral copies (<250 copies / mL). A negative result must be combined with clinical observations, patient history, and epidemiological information.  Fact Sheet for Patients:   StrictlyIdeas.no  Fact Sheet for Healthcare Providers: BankingDealers.co.za  This test is not yet approved or  cleared by the [place] FDA and has been authorized for detection and/or diagnosis of SARS-CoV-2 by FDA under an Emergency Use Authorization (EUA).  This EUA will remain in effect (meaning this test can be used) for the duration of the COVID-19 declaration under Section 564(b)(1) of the Act, 21 U.S.C. section 360bbb-3(b)(1), unless the authorization is terminated or revoked sooner.  Performed at [facility], [place] [address] [place], [place] [address]       Radiology Studies: DG Chest 2 View  Result Date: 10/20/2020 CLINICAL DATA:  Altered mental status EXAM: CHEST - 2 VIEW COMPARISON:  PET-CT September 25, 2020 FINDINGS: The heart size and mediastinal contours are within normal limits. Streaky left basilar opacity not significantly changed from prior favor atelectasis. Known right upper lobe pulmonary nodule not visualized on today's exam. No pleural effusion. No pneumothorax. The visualized skeletal structures are unremarkable. IMPRESSION: Streaky left basilar opacity, favor atelectasis though developing infection not excluded. Electronically Signed   By: Dahlia Bailiff MD   On: 10/20/2020 17:55   CT Head Wo Contrast  Result Date: 10/20/2020 CLINICAL DATA:   Mental status change, unknown cause. EXAM: CT HEAD WITHOUT CONTRAST TECHNIQUE: Contiguous axial images were obtained from the base of the skull through the vertex without intravenous contrast. COMPARISON:  MRI head 09/30/20 FINDINGS: Brain: Interval increase in number and size of numerous supratentorial hemorrhagic metastases. The largest lesion on the  left measures up to 5 point 1 cm in the left frontal lobe. The largest lesion on the right measures up to 3.3 cm in the right frontal lobe. Of note, there is a hemorrhagic lesion in the right thalamus. There is exuberant vasogenic edema associated with these lesions. No substantial midline shift. There is significant effacement of the right lateral ventricle. No hydrocephalus. Infratentorially, there is an 8 mm hemorrhagic metastasis within the superior right cerebellum. Vascular: No hyperdense vessel identified. Skull: No acute fracture. Prior left frontal and left parietal craniotomy. Sinuses/Orbits: No acute findings. Other: No mastoid effusions. IMPRESSION: Significant interval increase in number and size of numerous infratentorial and supratentorial hemorrhagic metastases with exuberant edema, as detailed above. Resulting effacement of the right lateral ventricle. An MRI with contrast could provide more complete evaluation if clinically indicated. Findings discussed with Dr. Gilford Raid Via telephone at 6:32 PM. Electronically Signed   By: Margaretha Sheffield MD   On: 10/20/2020 18:35    Scheduled Meds: . dexamethasone (DECADRON) injection  4 mg Intravenous Q6H  . pantoprazole  40 mg Oral Daily  . sodium chloride flush  3 mL Intravenous Q12H   Continuous Infusions:   LOS: 2 days   Time spent: 28 minutes   Darliss Cheney, MD [facility]  10/22/2020, 9:39 AM   To contact the attending provider between 7A-7P or the covering provider during after hours 7P-7A, please log into the web site [email or web address].

## 2020-10-22 NOTE — Consult Note (Signed)
Physical Medicine and Rehabilitation Consult Reason for Consult: Left side weakness and aphasia Referring Physician: [facility]   HPI: Sean [name] is a 55 y.o. 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 Betsy Coder and recently placed on dexamethasone with slow taper.  Per chart review patient lives with spouse.  1 level home 2 steps to entry.  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  Good family support.  Presented to 01/03/2021 with right side weakness and aphasia as well as fatigue x4 days.  CT of the head showed significant interval increase in number and size of numerous infratentorial and supratentorial hemorrhagic metastases with exuberant edema.  Resulting effacement of the right lateral ventricle.  Oncology service follow-up Dr. Alvy Bimler and awaiting full course of care..  Placed on high-dose intravenous Decadron.  Tolerating a regular consistency diet.  Therapy evaluations completed due to decline in ADLs and mobility with recommendations of physical medicine rehab consult.   Review of Systems  Constitutional: Negative for chills and fever.  HENT: Negative for hearing loss.   Eyes: Negative for blurred vision and double vision.  Respiratory: Negative for cough and shortness of breath.   Cardiovascular: Negative for chest pain and palpitations.  Gastrointestinal: Positive for constipation. Negative for heartburn, nausea and vomiting.  Genitourinary: Negative for dysuria, flank pain and hematuria.  Musculoskeletal: Positive for myalgias.  Skin: Negative for rash.  Neurological: Positive for dizziness, weakness and headaches.  All other systems reviewed and are negative.  Past Medical History:  Diagnosis Date  . Brain lesion 09/14/2020  . Hyperlipidemia   . Liver lesion 09/14/2020   Past Surgical History:  Procedure  Laterality Date  . APPLICATION OF CRANIAL NAVIGATION  09/29/2020   Procedure: APPLICATION OF CRANIAL NAVIGATION;  Surgeon: Judith Part, MD;  Location: [facility];  Service: Neurosurgery;;  . Kyla Balzarine Left 09/29/2020   Procedure: Left craniotomy for tumor resection with brainlab;  Surgeon: Judith Part, MD;  Location: [facility];  Service: Neurosurgery;  Laterality: Left;  . HERNIA REPAIR     left side inguinal hernia about 18-20 years   Family History  Problem Relation Age of Onset  . Osteoarthritis Mother   . Hypertension Father   . Colon polyps Father   . Colon polyps Brother   . Colon polyps Brother   . Colon cancer Neg Hx   . Stomach cancer Neg Hx   . Rectal cancer Neg Hx   . Esophageal cancer Neg Hx   . Liver cancer Neg Hx    Social History:  reports that he has quit smoking. He has never used smokeless tobacco. He reports current alcohol use. He reports that he does not use drugs. Allergies: No Known Allergies Medications Prior to Admission  Medication Sig Dispense Refill  . acetaminophen (TYLENOL) 500 MG tablet Take 1,000 mg by mouth every 6 (six) hours as needed for mild pain.    Marland Kitchen HYDROcodone-acetaminophen (NORCO/VICODIN) 5-325 MG tablet Take 1 tablet by mouth every 6 (six) hours as needed for moderate pain. 30 tablet 0  . levETIRAcetam (KEPPRA) 500 MG tablet Take 1 tablet (500 mg total) by mouth 2 (two) times daily. 60 tablet 0  . Multiple Vitamin (MULTIVITAMIN WITH MINERALS) TABS tablet Take 1 tablet by mouth daily. (Patient taking differently: Take 2 tablets by mouth daily. gummy)    . ondansetron (ZOFRAN) 4 MG tablet Take 1  tablet (4 mg total) by mouth daily as needed for nausea or vomiting. 30 tablet 0  . pantoprazole (PROTONIX) 40 MG tablet Take 1 tablet (40 mg total) by mouth daily at 12 noon. 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 Functional Status:  Mobility: Bed Mobility Overal bed mobility: Needs Assistance Bed Mobility: Sit to Supine,Rolling Rolling: Min assist Supine to sit: HOB elevated,Mod assist Sit to supine: Mod assist General bed mobility comments: Pt sitting EOB when OT arrived, mod A to sequence legs and manage trunk down safely, able to roll for fresh pad placement with min A and cues for sequencing Transfers Overall transfer level: Needs assistance Equipment used: 1 person hand held assist Transfers: Sit to/from [employer] Sit to Stand: Min assist Stand pivot transfers: Min assist General transfer comment: min A to power up from bed and BSC, "dancing" motion used by therapist to engage BLE in transfer back to bed. Ambulation/Gait Ambulation/Gait assistance: Mod assist Gait Distance (Feet): 60 Feet Assistive device: Rolling walker (2 wheeled) Gait Pattern/deviations: Step-through pattern,Decreased step length - left,Decreased stride length,Decreased dorsiflexion - left,Staggering left General Gait Details: Constant assistance at RW due to pt pushing it distal to his body despite cues to correct. Pt ambulates at slow pace with unsteadiness noted, drifting to L side of RW. Neglect of L side with L foot getting lateral to RW leg on occasion and L hand releasing from RW grip. Decreased L step length and foot clearance. ModA for management of RW and to maintain safety. Gait velocity: reduced Gait velocity interpretation:  <1.8 ft/sec, indicate of risk for recurrent falls    ADL: ADL Overall ADL's : Needs assistance/impaired Eating/Feeding: Moderate assistance,With caregiver independent assisting,Sitting Grooming: Sitting,Wash/dry hands,Minimal assistance Upper Body Bathing: Moderate assistance,Sitting Lower Body Bathing: Moderate assistance,Sitting/lateral leans Upper Body Dressing : Moderate assistance,Sitting Lower Body Dressing: Maximal assistance,Sit to/from stand Toilet Transfer: Minimal assistance,Stand-pivot,BSC Toilet Transfer Details (indicate cue type and reason): one person HHA Toileting- Clothing Manipulation and Hygiene: Moderate assistance,+2 for safety/equipment,Sit to/from stand Toileting - Clothing Manipulation Details (indicate cue type and reason): Pt able to stand with OT and wife performing rear peri care Functional mobility during ADLs: Moderate assistance,Cueing for sequencing (1 person HHA, SPT from bed>BSC>bed) General ADL Comments: Pt with deficits in coordination, cognition, vision, expressive language?, balance  Cognition: Cognition Overall Cognitive Status: Impaired/Different from baseline Orientation Level: Oriented to person,Disoriented to place,Disoriented to time,Disoriented to situation Cognition Arousal/Alertness: Awake/alert Behavior During Therapy: WFL for tasks assessed/performed Overall Cognitive Status: Impaired/Different from baseline Area of Impairment: Attention,Memory,Following commands,Safety/judgement,Awareness,Problem solving,Orientation Orientation Level: Place,Situation Current Attention Level: Sustained Memory: Decreased short-term memory,Decreased recall of precautions Following Commands: Follows one step commands inconsistently,Follows one step commands with increased time Safety/Judgement: Decreased awareness of safety,Decreased awareness of deficits Awareness: Intellectual Problem Solving: Decreased initiation,Slow processing,Difficulty  sequencing,Requires verbal cues,Requires tactile cues General Comments: Pt oriented to self, and sometimes "joking" around with therapist but likely confusion as well. Slightly impulsive, but following commands with extra time. attempted to shake hands with L hand - vision impaired  and requires further evaluation  Blood pressure 122/77, pulse 67, temperature 97.6 F (36.4 C), temperature source Oral, resp. rate 17, height 5\' 11"  (1.803 m), weight 90 kg, SpO2 95 %. Physical Exam Constitutional:      General: He is not in acute distress. Cardiovascular:     Rate and Rhythm: Normal rate.  Pulmonary:     Effort: Pulmonary effort is normal.  Abdominal:     Palpations: Abdomen is soft.  Neurological:     Comments: Patient is awake alert no acute distress.  Makes eye contact with examiner provides his name with some delay in processing.   Word finding deficits. Right hemiparesis. Follows simple commands.     No results found for this or any previous visit (from the past 24 hour(s)). No results found.   Assessment/Plan: Diagnosis: malignant melanoma with numerous left brain mets and resulting right hemiparesis and word finding deficits.  1. Does the need for close, 24 hr/day medical supervision in concert with the patient's rehab needs make it unreasonable for this patient to be served in a less intensive setting? Yes 2. Co-Morbidities requiring supervision/potential complications: oncological considerations 3. Due to bladder management, bowel management, safety, skin/wound care, disease management, medication administration, pain management and patient education, does the patient require 24 hr/day rehab nursing? Yes 4. Does the patient require coordinated care of a physician, rehab nurse, therapy disciplines of PT, OT, SLP to address physical and functional deficits in the context of the above medical diagnosis(es)? Yes Addressing deficits in the following areas: balance, endurance, locomotion,  strength, transferring, bowel/bladder control, bathing, dressing, feeding, grooming, toileting, cognition, speech, language, swallowing and psychosocial support 5. Can the patient actively participate in an intensive therapy program of at least 3 hrs of therapy per day at least 5 days per week? Yes 6. The potential for patient to make measurable gains while on inpatient rehab is good 7. Anticipated functional outcomes upon discharge from inpatient rehab are supervision  with PT, supervision and min assist with OT, supervision and min assist with SLP. 8. Estimated rehab length of stay to reach the above functional goals is: 14-20 days 9. Anticipated discharge destination: Home 10. Overall Rehab/Functional Prognosis: good  RECOMMENDATIONS: This patient's condition is appropriate for continued rehabilitative care in the following setting: CIR Patient has agreed to participate in recommended program. Potentially Note that insurance prior authorization may be required for reimbursement for recommended care.  Comment: Rehab Admissions Coordinator to follow up.  Thanks,  Meredith Staggers, MD, FAAPMR  I have examined pertinent labs and radiographic images. I have reviewed and concur with the physician assistant's documentation above.    Lavon Paganini Angiulli, PA-C 10/23/2020

## 2020-10-22 NOTE — Progress Notes (Signed)
Inpatient Rehab Admissions Coordinator Note:   Per therapy recommendations, pt was screened for CIR candidacy by Havannah Streat, MS CCC-SLP. At this time, Pt. Appears to have functional decline and is a good candidate for CIR. Will place order for rehab consult per protocol.  Please contact me with questions.   Freida Nebel, MS, CCC-SLP Rehab Admissions Coordinator  [phone] (celll) [phone] (office)  

## 2020-10-22 NOTE — Progress Notes (Signed)
Patient wife would like family visiting from [place] your to come and see the patient.I reviewed the visitation policy with her and informed her that it will be not be possible.

## 2020-10-23 ENCOUNTER — Telehealth: Payer: Self-pay | Admitting: *Deleted

## 2020-10-23 ENCOUNTER — Ambulatory Visit: Payer: 59 | Admitting: Oncology

## 2020-10-23 ENCOUNTER — Other Ambulatory Visit: Payer: Self-pay | Admitting: Radiation Therapy

## 2020-10-23 DIAGNOSIS — C7931 Secondary malignant neoplasm of brain: Secondary | ICD-10-CM | POA: Diagnosis not present

## 2020-10-23 MED ORDER — LEVETIRACETAM 500 MG PO TABS
500.0000 mg | ORAL_TABLET | Freq: Two times a day (BID) | ORAL | Status: DC
  Administered 2020-10-23 – 2020-10-26 (×7): 500 mg via ORAL
  Filled 2020-10-23 (×7): qty 1

## 2020-10-23 MED ORDER — DEXAMETHASONE SODIUM PHOSPHATE 4 MG/ML IJ SOLN
4.0000 mg | Freq: Two times a day (BID) | INTRAMUSCULAR | Status: DC
  Administered 2020-10-23 – 2020-10-26 (×6): 4 mg via INTRAVENOUS
  Filled 2020-10-23 (×6): qty 1

## 2020-10-23 NOTE — Progress Notes (Signed)
PROGRESS NOTE    Sean [name]  ZYS:[id number] DOB: 03-03-1966 DOA: 10/20/2020 PCP: Deland Pretty, MD   Brief Narrative:  Sean [name] is a 55 y.o. 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  Upon presentation to ED, he was hemodynamically stable.  All basic labs within normal range.  CT head without contrast showed significant interval increase in number and size of numerous infratentorial and supratentorial hemorrhagic metastases with exuberant edema.  Resulting effacement of the right lateral ventricle noted.  EDP discussed with on-call oncology who recommended starting IV Decadron 4 mg every 6 hours.  Patient also given 1 L normal saline and admitted to hospital service.  Oncology consulted.  Evaluated by PT OT who recommended CIR.  Assessment & Plan:   Principal Problem:   Brain metastasis ([place]) Active Problems:   Malignant melanoma metastatic to brain ([place])  Acute metabolic encephalopathy and left-sided weakness secondary to malignant melanoma with hemorrhagic brain metastasis and vasogenic edema: Patient with no change, remains alert but pleasantly confused.  No complaints.  Wife at the bedside.  Continue steroids per oncology recommendation.  Resume Keppra.  Evaluated by PT OT who recommends CIR.  CIR consult in place.  Awaiting further recommendations from oncology.  Patient's wife wants to talk to Dr. Ammie Dalton.  She was advised to call his office or discussed with primary RN to reach out to him.  DVT prophylaxis: SCDs Start: 10/20/20 2105   Code Status: Full Code  Family Communication: Discussed with wife at the bedside  Status is: Inpatient  Remains inpatient appropriate  because:Inpatient level of care appropriate due to severity of illness   Dispo: The patient is from: Home              Anticipated d/c is to: CIR              Anticipated d/c date is: 10/24/2020, waiting for insurance authorization              Patient currently is medically stable to d/c.   Difficult to place patient No        Estimated body mass index is 27.67 kg/m as calculated from the following:   Height as of this encounter: 5\' 11"  (1.803 m).   Weight as of this encounter: 90 kg.      Nutritional status:               Consultants:   Oncology  Procedures:   None  Antimicrobials:  Anti-infectives (From admission, onward)   None         Subjective: Patient seen and examined.  Wife at the bedside.  Patient alert and pleasantly confused like yesterday.  No other complaint.  Objective: Vitals:   10/22/20 1949 10/22/20 2315 10/23/20 0313 10/23/20 0748  BP: 139/79 130/80 122/77 133/74  Pulse: 81 73 67 74  Resp: 17 18 17 18   Temp: 98.4 F (36.9 C) 98 F (36.7 C) 97.6 F (36.4 C) 98.1 F (36.7 C)  TempSrc: Oral Oral Oral Oral  SpO2: 98% 96% 95% 98%  Weight:      Height:        Intake/Output Summary (Last 24 hours) at 10/23/2020 1013 Last data filed at 10/23/2020 0127 Gross per 24 hour  Intake  300 ml  Output 1200 ml  Net -900 ml   Filed Weights   10/20/20 1701  Weight: 90 kg    Examination:  General exam: Appears calm and comfortable  Respiratory system: Clear to auscultation. Respiratory effort normal. Cardiovascular system: S1 & S2 heard, RRR. No JVD, murmurs, rubs, gallops or clicks. No pedal edema. Gastrointestinal system: Abdomen is nondistended, soft and nontender. No organomegaly or masses felt. Normal bowel sounds heard. Central nervous system: Alert and partly oriented.  Weakness on the left upper and lower extremity improving and very close to normal now Extremities: Symmetric 5 x 5 power. Skin: No rashes, lesions or ulcers.   Psychiatry: Judgement and insight appear poor   Data Reviewed: I have personally reviewed following labs and imaging studies  CBC: Recent Labs  Lab 10/20/20 1729 10/21/20 0610 10/22/20 0219  WBC 6.2 6.2 7.7  NEUTROABS 4.7  --  6.3  HGB 15.0 14.0 13.8  HCT 41.6 40.1 38.2*  MCV 89.3 88.5 87.8  PLT 200 185 284   Basic Metabolic Panel: Recent Labs  Lab 10/20/20 1729 10/21/20 0610 10/22/20 0219  NA 140 139 139  K 3.9 4.0 4.0  CL 107 106 105  CO2 20* 22 23  GLUCOSE 104* 144* 167*  BUN 18 17 19   CREATININE 0.74 0.68 0.77  CALCIUM 9.0 8.9 9.0   GFR: Estimated Creatinine Clearance: 109.8 mL/min (by C-G formula based on SCr of 0.77 mg/dL). Liver Function Tests: Recent Labs  Lab 10/20/20 1729  AST 27  ALT 48*  ALKPHOS 110  BILITOT 1.5*  PROT 6.6  ALBUMIN 3.3*   No results for input(s): LIPASE, AMYLASE in the last 168 hours. No results for input(s): AMMONIA in the last 168 hours. Coagulation Profile: No results for input(s): INR, PROTIME in the last 168 hours. Cardiac Enzymes: No results for input(s): CKTOTAL, CKMB, CKMBINDEX, TROPONINI in the last 168 hours. BNP (last 3 results) No results for input(s): PROBNP in the last 8760 hours. HbA1C: No results for input(s): HGBA1C in the last 72 hours. CBG: No results for input(s): GLUCAP in the last 168 hours. Lipid Profile: No results for input(s): CHOL, HDL, LDLCALC, TRIG, CHOLHDL, LDLDIRECT in the last 72 hours. Thyroid Function Tests: No results for input(s): TSH, T4TOTAL, FREET4, T3FREE, THYROIDAB in the last 72 hours. Anemia Panel: No results for input(s): VITAMINB12, FOLATE, FERRITIN, TIBC, IRON, RETICCTPCT in the last 72 hours. Sepsis Labs: Recent Labs  Lab 10/20/20 1911  LATICACIDVEN 0.9    Recent Results (from the past 240 hour(s))  Culture, blood (routine x 2)     Status: None (Preliminary result)   Collection Time: 10/20/20  5:29 PM   Specimen: BLOOD  Result Value Ref Range Status   Specimen  Description BLOOD RIGHT ANTECUBITAL  Final   Special Requests   Final    BOTTLES DRAWN AEROBIC AND ANAEROBIC Blood Culture results may not be optimal due to an inadequate volume of blood received in culture bottles   Culture   Final    NO GROWTH 3 DAYS Performed at [facility], [place] [address] [place], [place] [address]    Report Status PENDING  Incomplete  Culture, blood (routine x 2)     Status: None (Preliminary result)   Collection Time: 10/20/20  5:29 PM   Specimen: BLOOD  Result Value Ref Range Status   Specimen Description BLOOD LEFT ANTECUBITAL  Final   Special Requests   Final    BOTTLES DRAWN AEROBIC AND ANAEROBIC Blood  Culture results may not be optimal due to an inadequate volume of blood received in culture bottles   Culture   Final    NO GROWTH 3 DAYS Performed at [facility], [place] [address] [place], [place] [address]    Report Status PENDING  Incomplete  Urine culture     Status: Abnormal   Collection Time: 10/20/20  5:31 PM   Specimen: Urine, Random  Result Value Ref Range Status   Specimen Description URINE, RANDOM  Final   Special Requests   Final    NONE Performed at [facility], [place] [address], [place], [place] [address]    Culture MULTIPLE SPECIES PRESENT, SUGGEST RECOLLECTION (A)  Final   Report Status 10/22/2020 FINAL  Final  SARS Coronavirus 2 by RT PCR (hospital order, performed in [facility] hospital lab) Nasopharyngeal Nasopharyngeal Swab     Status: None   Collection Time: 10/20/20  6:08 PM   Specimen: Nasopharyngeal Swab  Result Value Ref Range Status   SARS Coronavirus 2 NEGATIVE NEGATIVE Final    Comment: (NOTE) SARS-CoV-2 target nucleic acids are NOT DETECTED.  The SARS-CoV-2 RNA is generally detectable in upper and lower respiratory specimens during the acute phase of infection. The lowest concentration of SARS-CoV-2 viral copies this assay can detect is 250 copies / mL. A negative result does not preclude SARS-CoV-2  infection and should not be used as the sole basis for treatment or other patient management decisions.  A negative result may occur with improper specimen collection / handling, submission of specimen other than nasopharyngeal swab, presence of viral mutation(s) within the areas targeted by this assay, and inadequate number of viral copies (<250 copies / mL). A negative result must be combined with clinical observations, patient history, and epidemiological information.  Fact Sheet for Patients:   StrictlyIdeas.no  Fact Sheet for Healthcare Providers: BankingDealers.co.za  This test is not yet approved or  cleared by the [place] FDA and has been authorized for detection and/or diagnosis of SARS-CoV-2 by FDA under an Emergency Use Authorization (EUA).  This EUA will remain in effect (meaning this test can be used) for the duration of the COVID-19 declaration under Section 564(b)(1) of the Act, 21 U.S.C. section 360bbb-3(b)(1), unless the authorization is terminated or revoked sooner.  Performed at [facility], [place] [address] [place],  [address]       Radiology Studies: No results found.  Scheduled Meds: . dexamethasone (DECADRON) injection  4 mg Intravenous Q12H  . levETIRAcetam  500 mg Oral BID  . pantoprazole  40 mg Oral Daily  . sodium chloride flush  3 mL Intravenous Q12H   Continuous Infusions:   LOS: 3 days   Time spent: 25 minutes   Darliss Cheney, MD [facility]  10/23/2020, 10:13 AM   To contact the attending provider between 7A-7P or the covering provider during after hours 7P-7A, please log into the web site [email or web address].

## 2020-10-23 NOTE — Progress Notes (Signed)
Inpatient Rehabilitation Admissions Coordinator  I met with pt's wife outside of room to lessen the distractions for patient as he works with therapy. I discussed goals and expectations of a possible CIR admit. I spoke with wife as well as spoke on conference call with pt's sister, [name]. I will begin insurance authorization with [facility] for a possible CIR admit.  Danne Baxter, RN, MSN Rehab Admissions Coordinator [phone] 10/23/2020 3:04 PM

## 2020-10-23 NOTE — PMR Pre-admission (Addendum)
PMR Admission Coordinator Pre-Admission Assessment  Patient: Sean [name] is an 55 y.o., male MRN: [id number] DOB: October 04, 1965 Height: 5\' 11"  (180.3 cm) Weight: 90 kg              Insurance Information HMO:     PPO:      PCP:      IPA:      80/20:      OTHER:  PRIMARY: [employer]  commercial Policy#: [id number]      Subscriber: pt CM Name: Elta Guadeloupe    Phone#: [phone] ext [phone]     Fax#: [phone] Pre-Cert#: [id number]      Employer: [employer] Benefits:  Phone #: [phone]     Name: 2/7 Eff. Date: 09/16/20     Deduct: $500      Out of Pocket Max: $2500      Life Max: none  CIR: 80%      SNF: 100% 90 day limit Outpatient: $25 per visit     Co-Pay: 60 visits combined Home Health: 75%      Co-Pay: 90 visits combined per year DME: 80%     Co-Pay: 20% Providers: in network  SECONDARY: none       Financial Counselor:       Phone#:   [employer]" for patients in Inpatient Rehabilitation Facilities with attached "Privacy Act [place] Records" was provided and verbally reviewed with: N/A  Emergency Contact Information Contact Information    Name Relation Home Work Mobile   [place] Spouse   [phone]   Cathleen Corti   [phone]     Current Medical History  Patient Admitting Diagnosis: malignant melanoma with brain mets  History of Present Illness:  55 year old right-handed male with history significant for 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 Betsy Coder of oncology services and recently placed on dexamethasone with slow taper. Presented 10/20/2020 with right side weakness and aphasia as well as fatigue x 4 days.  Admission chemistries unremarkable aside glucose 104, ALT 48, hemoglobin 15, urine culture multiple species, lactic acid 0.9.  CT of the head showed significant interval increase in number and size of numerous infratentorial  and supratentorial hemorrhagic metastasis with exuberant edema.  Resulting effacement of the right lateral ventricle.  Recent MRI of abdomen 09/15/2020 multifocal T1 hyperintense liver lesions, no kidney mass.  He underwent ultrasound-guided biopsy of a segment 4B liver lesion 09/18/2020 benign liver parenchyma with steatosis, no evidence of malignancy.  PET 09/25/2020 focal area of hypermetabolism in the right hilum, multiple areas of the liver, pelvic musculature, and left thigh-no CT correlate with any of these areas on the non contrast CT, 10 mm anterior right upper lobe nodule with low-level hypermetabolism, additional tiny pulmonary nodules too small to characterize by PET.  Underwent resection of left occipital left frontal lesions 09/29/2020 pathology consistent with malignant melanoma.  MRI of the brain completed 10/24/2020 shows progression of metastatic melanoma with multiple new or increased size lesions.  Moderate vasogenic edema surrounding lesions in both frontal lobes right basal ganglia both occipital lobes and both parietal lobes.  New mass-effect on the right lateral ventricle without significant midline shift.  Oncology follow-up Dr. Alvy Bimler as well as follow-up Dr. Mickeal Skinner and remains on high-dose intravenous Decadron.  Keppra initiated for CVA prophylaxis.  Palliative care consulted to establish goals of care.  Patient tolerating a regular consistency diet.    Past Medical History  Past Medical History:  Diagnosis Date  . Brain lesion 09/14/2020  . Hyperlipidemia   . Liver lesion 09/14/2020    Family History  family history includes Colon polyps in his brother, brother, and father; Hypertension in his father; Osteoarthritis in his mother.  Prior Rehab/Hospitalizations:  Has the patient had prior rehab or hospitalizations prior to admission? Yes  Has the patient had major surgery during 100 days prior to admission? Yes  Current Medications   Current Facility-Administered  Medications:  .  acetaminophen (TYLENOL) tablet 650 mg, 650 mg, Oral, Q6H PRN, 650 mg at 10/25/20 1828 **OR** acetaminophen (TYLENOL) suppository 650 mg, 650 mg, Rectal, Q6H PRN, Patel, Vishal R, MD .  dexamethasone (DECADRON) injection 4 mg, 4 mg, Intravenous, Q12H, Curcio, Kristin R, NP, 4 mg at 10/25/20 2150 .  levETIRAcetam (KEPPRA) tablet 500 mg, 500 mg, Oral, BID, Pham, Minh Q, RPH-CPP, 500 mg at 10/25/20 2153 .  melatonin tablet 5 mg, 5 mg, Oral, QHS PRN, Shela Leff, MD, 5 mg at 10/25/20 2153 .  ondansetron (ZOFRAN) tablet 4 mg, 4 mg, Oral, Q6H PRN **OR** ondansetron (ZOFRAN) injection 4 mg, 4 mg, Intravenous, Q6H PRN, Posey Pronto, Vishal R, MD .  oxyCODONE (Oxy IR/ROXICODONE) immediate release tablet 5-10 mg, 5-10 mg, Oral, Q4H PRN, Opyd, Ilene Qua, MD, 5 mg at 10/26/20 0022 .  pantoprazole (PROTONIX) EC tablet 40 mg, 40 mg, Oral, Daily, Pahwani, Ravi, MD, 40 mg at 10/25/20 1028 .  sodium chloride flush (NS) 0.9 % injection 3 mL, 3 mL, Intravenous, Q12H, Zada Finders R, MD, 3 mL at 10/25/20 2154  Patients Current Diet:  Diet Order            Diet - low sodium heart healthy           Diet regular Room service appropriate? 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  Self Care: Did the patient need help bathing, dressing, using the toilet or eating?  Independent  Indoor Mobility: Did the patient need assistance with walking from room to room (with or  without device)? Independent  Stairs: Did the patient need assistance with internal or external stairs (with or without device)? Independent  Functional Cognition: Did the patient need help planning regular tasks such as shopping or remembering to take medications? Independent  Home Assistive Devices / Equipment Home Equipment: None  Prior Device Use: Indicate devices/aids used by the patient prior to current illness, exacerbation or injury? None of the above  Current Functional Level Cognition  Arousal/Alertness: Awake/alert Overall Cognitive Status: Impaired/Different from baseline Current Attention Level: Sustained Orientation Level: Oriented X4 Following Commands: Follows one step commands with increased time,Follows multi-step commands inconsistently,Follows one step commands inconsistently Safety/Judgement: Decreased awareness of safety,Decreased awareness of deficits General Comments: Pt follows cues inconsistently and needs multi-modal cues and rewording or visual cues to get pt to appropriately follow directions. Apraxia noted as pt has poor motor planning with cues but can perform tasks that are more automatic  and salient to his desires. Pt needing physical assistance to avoid obstacles, poor safety and deficits awareness. Easily distracted, needing quiet environment to follow cues. Attention: Sustained Sustained Attention: Impaired Sustained Attention Impairment: Verbal complex Memory: Impaired Memory Impairment: Retrieval deficit,Decreased recall of new information Awareness: Impaired Awareness Impairment: Emergent impairment,Intellectual impairment Problem Solving: Impaired Problem Solving Impairment: Verbal basic,Functional basic Executive Function: Initiating Initiating: Impaired Initiating Impairment: Verbal basic,Functional basic Safety/Judgment: Impaired    Extremity Assessment (includes Sensation/Coordination)  Upper Extremity Assessment: RUE deficits/detail,LUE  deficits/detail,Generalized weakness RUE Deficits / Details: generalized weakness, able to perform finger to thumb, supination/pronation RUE Sensation: WNL RUE Coordination:  (increased time) LUE Deficits / Details: generally 4/5 able to perform finger to thumb - harder on ring and pinky finger LUE Sensation: decreased proprioception LUE Coordination: decreased fine motor,decreased gross motor (Difficulty utilizing LUE functionally during ADLs.)  Lower Extremity Assessment: Defer to PT evaluation RLE Deficits / Details: MMT scores of 4 to 5 grossly throughout RLE Sensation: WNL (unable to assess dynamic proprioception due to poor attention span and following commands) RLE Coordination: decreased gross motor LLE Deficits / Details: MMT scores of 3+ to 4 grossly throughout LLE Sensation: WNL (unable to assess dynamic proprioception due to poor attention span and following commands) LLE Coordination: decreased fine motor,decreased gross motor (dysdiadochokinesia noted)    ADLs  Overall ADL's : Needs assistance/impaired Eating/Feeding: Moderate assistance,With caregiver independent assisting,Sitting Grooming: Sitting,Wash/dry hands,Minimal assistance Upper Body Bathing: Moderate assistance,Sitting Lower Body Bathing: Moderate assistance,Sitting/lateral leans Upper Body Dressing : Moderate assistance,Sitting Lower Body Dressing: Maximal assistance,Sit to/from stand Toilet Transfer: Minimal assistance,Stand-pivot,BSC Toilet Transfer Details (indicate cue type and reason): one person HHA Toileting- Clothing Manipulation and Hygiene: Moderate assistance,+2 for safety/equipment,Sit to/from stand Toileting - Clothing Manipulation Details (indicate cue type and reason): Pt able to stand with OT and wife performing rear peri care Functional mobility during ADLs: Moderate assistance,Cueing for sequencing (1 person HHA, SPT from bed>BSC>bed) General ADL Comments: Pt with deficits in coordination,  cognition, vision, expressive language?, balance    Mobility  Overal bed mobility: Needs Assistance Bed Mobility: Supine to Sit Rolling: Min assist Sidelying to sit: Mod assist Supine to sit: HOB elevated,Mod assist Sit to supine: Min assist General bed mobility comments: ModA with HHA to pull trunk to ascend as pt unable to initiate with simple verbal cues and demonstration to copy PT. Needing assistance at legs to manage off bed.    Transfers  Overall transfer level: Needs assistance Equipment used: 1 person hand held assist Transfers: Sit to/from Stand Sit to Stand: Min assist Stand pivot transfers: Min assist General transfer comment: Extra time and repeated cues to scoot anteriorly with physical assistance for feet placement. Pt pulls up on RW rather than pushing up from bed, despite cues. MinA for steadying and extra time to process and complete.    Ambulation / Gait / Stairs / Wheelchair Mobility  Ambulation/Gait Ambulation/Gait assistance: Mod assist,Min assist Gait Distance (Feet): 225 Feet Assistive device: Rolling walker (2 wheeled) Gait Pattern/deviations: Step-through pattern,Decreased step length - left,Decreased stride length,Decreased dorsiflexion - left,Staggering left General Gait Details: Poor attention to L side with pt bumping obstacles on L and drifting to L, especially as pt fatigued final ~70 ft. Decreased L step length and height, providing verbal and tactile cues to improve, fair carryover. Min-modA to manage RW and stabilize pt. Pt neglects L hand and needs cues to attend to it for proper replacement on RW on occasion. Improved midline alignment in RW this date initially, but  increased drift to L with fatigue. Gait velocity: reduced Gait velocity interpretation: <1.8 ft/sec, indicate of risk for recurrent falls    Posture / Balance Dynamic Sitting Balance Sitting balance - Comments: No LOB sitting statically EOB, supervision for safety. Balance Overall  balance assessment: Needs assistance Sitting-balance support: No upper extremity supported,Feet supported Sitting balance-Leahy Scale: Good Sitting balance - Comments: No LOB sitting statically EOB, supervision for safety. Standing balance support: Bilateral upper extremity supported,During functional activity Standing balance-Leahy Scale: Poor Standing balance comment: Bil UE support and external assist for safety.    Special needs/care consideration Designated visitor is sister, [name], who is a Therapist, sports. Wife, [name], returning to work , but would like communication of plan of care High Fall Risk Palliative care consulted for goals of care   Previous Home Environment  Living Arrangements: Spouse/significant other  Lives With: Spouse Available Help at Discharge: Family,Available 24 hours/day,Neighbor Type of Home: [place]: One level Home Access: Stairs to enter Entrance Stairs-Rails: None Entrance Stairs-Number of Steps: 2 Bathroom Shower/Tub: Chiropodist: Handicapped height Bathroom Accessibility: Yes How Accessible: Accessible via walker [place]: No Additional Comments: 2 dogs  Discharge Living Setting Plans for Discharge Living Setting: Patient's home,Lives with (comment) (wife) Type of Home at Discharge: House Discharge Home Layout: One level Discharge Home Access: Stairs to enter Entrance Stairs-Rails: None Entrance Stairs-Number of Steps: 2 Discharge Bathroom Shower/Tub: Tub/shower unit Discharge Bathroom Toilet: Handicapped height Discharge Bathroom Accessibility: Yes How Accessible: Accessible via walker Does the patient have any problems obtaining your medications?: No  Social/Family/Support Systems Contact Information: wife, [name] Anticipated Caregiver: wife and pt's siblings Anticipated Ambulance person Information: see above Ability/Limitations of Caregiver: wife works; pt's sister, [name] is an Therapist, sports for [employer], his siblings  will asisst when wife works Building control surveyor Availability: 24/7 Discharge Plan Discussed with Primary Caregiver: Yes Is Caregiver In Agreement with Plan?: Yes Does Caregiver/Family have Issues with Lodging/Transportation while Pt is in Rehab?: No  Goals Patient/Family Goal for Rehab: supervision PT, supervision to [place] with OT and SLP Expected length of stay: [facility] 14 to 20 days Additional Information: had undergone first cycle of Chemo on 10/17/19 Pt/Family Agrees to Admission and willing to participate: Yes Program Orientation Provided & Reviewed with Pt/Caregiver Including Roles  & Responsibilities: Yes  Decrease burden of Care through IP rehab admission: n/a  Possible need for SNF placement upon discharge:not anticipated  Patient Condition: This patient's medical and functional status has changed since the consult dated: 10/23/2020 in which the Rehabilitation Physician determined and documented that the patient's condition is appropriate for intensive rehabilitative care in an inpatient rehabilitation facility. See "History of Present Illness" (above) for medical update. Functional changes are: mod assist overall. Patient's medical and functional status update has been discussed with the Rehabilitation physician and patient remains appropriate for inpatient rehabilitation. Will admit to inpatient rehab today.  Preadmission Screen Completed By:  Cleatrice Burke, RN, 10/26/2020 11:18 AM ______________________________________________________________________   Discussed status with Dr. Dagoberto Ligas on 10/26/20 at  11 and received approval for admission today.  Admission Coordinator:  Cleatrice Burke, time 1100 Date 10/26/2020

## 2020-10-23 NOTE — Progress Notes (Signed)
Ok to resume home dose Keppra per Dr Doristine Bosworth, R.   Onnie Boer, PharmD, BCIDP, AAHIVP, CPP Infectious Disease Pharmacist 10/23/2020 9:35 AM

## 2020-10-23 NOTE — Progress Notes (Addendum)
HEMATOLOGY-ONCOLOGY PROGRESS NOTE  SUBJECTIVE: Sean [name] presented to the hospital with right-sided weakness and aphasia.  CT of the head without contrast showed significant interval increase in the number and size of numerous infratentorial and supratentorial hemorrhagic metastases with significant edema.  He has been started on steroids.  The patient is oriented to person and situation but not to place.  When seen this afternoon, the patient states that he is ready to be discharged.  He has taken apart his telemetry monitor is try to take his Foley catheter out.  No family at the bedside.  Oncology History  Melanoma of skin ([place])  10/05/2020 Initial Diagnosis   Melanoma of skin ([place])   10/05/2020 Cancer Staging   Staging form: Melanoma of the Skin, AJCC 8th Edition - Pathologic: Stage IV (pTX, pNX, pM1) - Signed by [name], MD on 10/05/2020   10/16/2020 -  Chemotherapy    Patient is on Treatment Plan: MELANOMA NIVOLUMAB + IPILIMUMAB (1/3) Q21D / NIVOLUMAB Q14D       PHYSICAL EXAMINATION:  Vitals:   10/23/20 0313 10/23/20 0748  BP: 122/77 133/74  Pulse: 67 74  Resp: 17 18  Temp: 97.6 F (36.4 C) 98.1 F (36.7 C)  SpO2: 95% 98%   Filed Weights   10/20/20 1701  Weight: 90 kg    Intake/Output from previous day: 02/06 0701 - 02/07 0700 In: 300 [P.O.:300] Out: 1200 [Urine:1200]  GENERAL:alert, no distress and comfortable SKIN: skin color, texture, turgor are normal, no rashes or significant lesions EYES: normal, Conjunctiva are pink and non-injected, sclera clear OROPHARYNX:no exudate, no erythema and lips, buccal mucosa, and tongue normal  LUNGS: clear to auscultation and percussion with normal breathing effort HEART: regular rate & rhythm and no murmurs and no lower extremity edema ABDOMEN:abdomen soft, non-tender and normal bowel sounds NEURO: Alert, oriented to person and knows that he is being treated for melanoma.  He is not oriented to place.    LABORATORY  DATA:  I have reviewed the data as listed CMP Latest Ref Rng & Units 10/22/2020 10/21/2020 10/20/2020  Glucose 70 - 99 mg/dL 167(H) 144(H) 104(H)  BUN 6 - 20 mg/dL 19 17 18   Creatinine 0.61 - 1.24 mg/dL 0.77 0.68 0.74  Sodium 135 - 145 mmol/L 139 139 140  Potassium 3.5 - 5.1 mmol/L 4.0 4.0 3.9  Chloride 98 - 111 mmol/L 105 106 107  CO2 22 - 32 mmol/L 23 22 20(L)  Calcium 8.9 - 10.3 mg/dL 9.0 8.9 9.0  Total Protein 6.5 - 8.1 g/dL - - 6.6  Total Bilirubin 0.3 - 1.2 mg/dL - - 1.5(H)  Alkaline Phos 38 - 126 U/L - - 110  AST 15 - 41 U/L - - 27  ALT 0 - 44 U/L - - 48(H)    Lab Results  Component Value Date   WBC 7.7 10/22/2020   HGB 13.8 10/22/2020   HCT 38.2 (L) 10/22/2020   MCV 87.8 10/22/2020   PLT 207 10/22/2020   NEUTROABS 6.3 10/22/2020    DG Chest 2 View  Result Date: 10/20/2020 CLINICAL DATA:  Altered mental status EXAM: CHEST - 2 VIEW COMPARISON:  PET-CT September 25, 2020 FINDINGS: The heart size and mediastinal contours are within normal limits. Streaky left basilar opacity not significantly changed from prior favor atelectasis. Known right upper lobe pulmonary nodule not visualized on today's exam. No pleural effusion. No pneumothorax. The visualized skeletal structures are unremarkable. IMPRESSION: Streaky left basilar opacity, favor atelectasis though developing infection not  excluded. Electronically Signed   By: Dahlia Bailiff MD   On: 10/20/2020 17:55   CT Head Wo Contrast  Result Date: 10/20/2020 CLINICAL DATA:  Mental status change, unknown cause. EXAM: CT HEAD WITHOUT CONTRAST TECHNIQUE: Contiguous axial images were obtained from the base of the skull through the vertex without intravenous contrast. COMPARISON:  MRI head 09/30/20 FINDINGS: Brain: Interval increase in number and size of numerous supratentorial hemorrhagic metastases. The largest lesion on the left measures up to 5 point 1 cm in the left frontal lobe. The largest lesion on the right measures up to 3.3 cm in the  right frontal lobe. Of note, there is a hemorrhagic lesion in the right thalamus. There is exuberant vasogenic edema associated with these lesions. No substantial midline shift. There is significant effacement of the right lateral ventricle. No hydrocephalus. Infratentorially, there is an 8 mm hemorrhagic metastasis within the superior right cerebellum. Vascular: No hyperdense vessel identified. Skull: No acute fracture. Prior left frontal and left parietal craniotomy. Sinuses/Orbits: No acute findings. Other: No mastoid effusions. IMPRESSION: Significant interval increase in number and size of numerous infratentorial and supratentorial hemorrhagic metastases with exuberant edema, as detailed above. Resulting effacement of the right lateral ventricle. An MRI with contrast could provide more complete evaluation if clinically indicated. Findings discussed with Dr. Gilford Raid Via telephone at 6:32 PM. Electronically Signed   By: Margaretha Sheffield MD   On: 10/20/2020 18:35   MR BRAIN W WO CONTRAST  Result Date: 09/30/2020 CLINICAL DATA:  Status post craniotomy for tumor section. EXAM: MRI HEAD WITHOUT AND WITH CONTRAST TECHNIQUE: Multiplanar, multiecho pulse sequences of the brain and surrounding structures were obtained without and with intravenous contrast. CONTRAST:  61mL GADAVIST GADOBUTROL 1 MMOL/ML IV SOLN COMPARISON:  Brain MRI 09/18/2020 FINDINGS: Brain: There are numerous diffusion restricting lesions throughout both hemispheres, which have associated hemorrhage and areas of contrast enhancement. New or increased sized lesions are as follows: 1. Right occipital lobe, 7 mm, previously 5 mm, series 18, image 24 2. Right basal ganglia, 3 mm, series 18, image 31 3. Right thalamus, 8 mm, image 34, mild surrounding edema, intrinsic hyperintense T1-weighted signal 4. Left frontal operculum, enhancing component 5 mm surrounding area 17 mm, possibly previously present as a punctate lesion, image 35 5. Superior left  occipital lobe, 14 mm, previously 10 mm, image 37, mild surrounding edema 6. Anterior right frontal lobe 3 mm, image 37, intrinsic hyperintense T1-weighted signal 7. Left frontal lobe, 6 mm, previously 4 mm, image 44, intrinsic hyperintense T1-weighted signal Unchanged or smaller lesions: 1. Superior right cerebellum, 3 mm, series 18, image 23 2. Dominant left occipital lesion has been resected. There is minimal residual contrast enhancement adjacent to the occipital horn of the left lateral ventricle, image 32, mild edema 3. Left frontal lobe lesion status post resection with no residual nodular contrast enhancement, image 37, mild edema 4. Superomedial right parietal lobe, 4 mm, image 42 5. Left parietal lobe, 3 mm, image 45 6. Punctate left parietal lobe, image 43 7. Paramedian superior right frontal lobe, 14 mm, image 49, mild edema with intrinsic hyperintense T1-weighted signal Vascular: Normal flow voids. Skull and upper cervical spine: Left frontal and parietal craniotomies. Sinuses/Orbits: Negative. Other: None. IMPRESSION: 1. Status post resection of left frontal and occipital lesions with minimal residual contrast enhancement near the occipital horn of the left lateral ventricle. 2. 7 lesions that are new or larger. 3. Remainder of lesions are unchanged or smaller. Electronically Signed   By: Lennette Bihari  [place] M.D.   On: 09/30/2020 01:34   NM PET Image Initial (PI) Skull Base To Thigh  Result Date: 09/25/2020 CLINICAL DATA:  Initial treatment strategy for metastatic disease of unknown primary. EXAM: NUCLEAR MEDICINE PET SKULL BASE TO THIGH TECHNIQUE: 11.4 mCi F-18 FDG was injected intravenously. Full-ring PET imaging was performed from the skull base to thigh after the radiotracer. CT data was obtained and used for attenuation correction and anatomic localization. Fasting blood glucose: 83 mg/dl COMPARISON:  Chest abdomen pelvis CT 09/14/2020. FINDINGS: Mediastinal blood pool activity: SUV max 2.4 Liver  activity: SUV max NA NECK: No hypermetabolic lymph nodes in the neck. Incidental CT findings: none CHEST: Focal hypermetabolism in the right hilum identified with SUV max = 4.8. No definite lymphadenopathy evident on noncontrast CT imaging today. No other suspicious mediastinal or hilar uptake. 10 mm anterior right upper lobe nodule on 87/3 shows low level hypermetabolism with SUV max = 2.0. Scattered additional tiny pulmonary nodules are too small to characterize by PET imaging. Incidental CT findings: Minimal dependent atelectasis bilaterally. ABDOMEN/PELVIS: Multiple hypermetabolic lesions are seen in the liver,. These appear to be capsular/subcapsular and no abnormality is evident on noncontrast CT imaging. Hypermetabolic lesion adjacent to the falciform ligament in segment III demonstrates SUV max = 6.5. Capsular/subcapsular lesion medial liver at the porta hepatis demonstrates SUV max = 10.4. A third focus of hypermetabolism is seen in inferior tip of the right liver, again with no CT correlate but SUV max = 7.9. No hypermetabolic lymphadenopathy in the abdomen or pelvis. No hypermetabolic abnormality in the pancreas or spleen. No adrenal hypermetabolism. Focal hypermetabolism identified in the right iliopsoas muscles of the pelvis with SUV max = 5.7. No CT correlate. Focal hypermetabolism identified in the abductor muscles of the medial left thigh ( SUV max = 11.6) without underlying lesion by CT imaging. There is a focus of hypermetabolism musculature of the medial left thigh with SUV max = 5.7. Again, no abnormality on CT imaging at this location. Incidental CT findings: Atherosclerotic calcification noted in the abdominal aorta. SKELETON: Mottled uptake is identified in bony anatomy without definite evidence for bony metastases Incidental CT findings: none IMPRESSION: 1. Focal areas of hypermetabolism identified in the right hilum, multiple areas of the liver adjacent to the capsule, and in musculature of  the pelvis and left thigh. No CT correlate in any of these locations on today's noncontrast CT imaging. Nevertheless, imaging features concerning for metastatic disease. Repeat imaging with IV contrast could be used to further evaluate as clinically warranted. 2. 10 mm anterior right upper lobe pulmonary nodule shows low level hypermetabolism. Additional tiny pulmonary nodules are too small to characterize on PET imaging. 3.  Aortic Atherosclerois (ICD10-170.0) Electronically Signed   By: Misty Stanley M.D.   On: 09/25/2020 14:49    ASSESSMENT AND PLAN: 1.Multiple brain metastases, unknown primary tumor site  CT brain 09/14/2020-multiple hemorrhagic metastases with surrounding edema in the bilateral cerebral hemispheres  CTs chest, abdomen, pelvis 09/14/2020-1.4 cm right upper lobe subpleural nodule, 0.5 cm right lower lobe nodule, multiple hypodensities in the liver suspicious for metastases, 1.5 cm right upper pole hypodense kidney lesion-indeterminate  MRI abdomen 09/15/2020-multifocal T1 hyperintense liver lesions, no kidney mass  Ultrasound-guided biopsy of a segment 4B liver lesion 09/18/2020-benign liver parenchyma with steatosis, no evidence of malignancy  PET 09/25/2020-focal area of hypermetabolism in the right hilum, multiple areas of the liver, pelvic musculature, and left thigh-no CT correlate with any of these areas on the noncontrast CT,  10 mm anterior right upper lobe nodule with low-level hypermetabolism, additional tiny pulmonary nodules too small to characterize by PET  SRS to 10 brain lesions 09/27/2020  Resection of left occipital and left frontal lesions on 09/29/2020-pathology consistent with malignant melanoma  Cycle 1 ipilimumab/nivolumab 10/16/2020  CT of the brain without contrast 10/26/2020 showed significant interval increase in the number and size of numerous metastases  2.Left-sided weakness, new onset seizure secondary to #1, weakness has  resolved 3.Hyperlipidemia 4.Elevated liver enzymes and bilirubin  Liver enzymes more elevated on repeat labs 09/27/2020, improved1/13/2022 5.Headache and nausea/vomiting secondary to #1, resolved 6.  Hospital admission 10/20/2020-encephalopathy  Sean [name] is currently admitted to the hospital for encephalopathy.  CT of the brain without contrast showed concern for possible disease progression.  Case has been discussed with radiation oncology as well as neuro-oncology.  Unclear if CT scan findings are related to toxicity from surgery/radiation, disease progression, worsening edema when tapered off Decadron, or flare of disease following immunotherapy.  MRI of the brain with and without contrast is recommended to further characterize this.  He is currently receiving dexamethasone 4 mg every 6 hours and will reduce this to every 12 hours.  Our plan is to continue a trial of immunotherapy for systemic disease.  We will attempt to have a meeting with the patient's wife for2/04/2021.  Recommendations: 1.  MRI the brain with and without contrast 2.  Dexamethasone 4 mg every 12 hours 3.  We will arrange for family meeting for 10/24/2020.   LOS: 3 days   Mikey Bussing, DNP, AGPCNP-BC, AOCNP 10/23/20 Sean [name] is interviewed and examined.  I reviewed the admission CT images.  I discussed the case with the neuro oncology service.  I spoke to his wife by telephone.  I plan to meet with her on 10/24/2020.  He completed cycle 1 of systemic therapy with ipilimumab and nivolumab on 10/16/2020.  He was admitted on 10/20/2020 with altered mental status and left-sided weakness.  His symptoms have partially improved with Decadron.  The CT is consistent with progression of hemorrhagic brain metastases, though the CT and clinical presentation could be related to toxicity from radiation, or a tumor flare from immunotherapy.  Dr. Mickeal Skinner recommends a brain MRI to further evaluate the CNS.  We will order the  study.  The plan is to continue Decadron and initiate physical therapy.  He is scheduled for a second treatment with ipilimumab and nivolumab on 11/06/2020.  We will have continued discussions regarding the indication for continue treatment based on his clinical status over the next several days.

## 2020-10-23 NOTE — Progress Notes (Signed)
Physical Therapy Treatment Patient Details Name: Sean [name] MRN: [id number] DOB: Jan 17, 1966 Today's Date: 10/23/2020    History of Present Illness Pt is a 55 y.o. male who presents with AMS, R side neglect and weakness, aphasia, and urinary incontinence since 1/31. Pt has hx of multiple brain mets and craniotomy with tumor resection on the L 1/14. 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 S/p cycle 1 ipilimumab/nivolumab 1/31 with pt stopping Decadron due to reported interaction with his chemotherapy plan. CT showed significant interval increase in number and size of numerous infratentorial and supratentorial hemorrhagic metastases with significant vasogenic edema.  Resulting effacement of the right lateral ventricle noted. PMH of liver lesion and hyperlipidemia.    PT Comments    Today's skilled session continued to focus on cognition and mobility progression. Pt remains appropriate for CIR for progressing independence with mobility to return home with family assistance. Acute PT to continue during pt's hospital stay.    Follow Up Recommendations  CIR     Equipment Recommendations  Rolling walker with 5" wheels;3in1 (PT);Hospital bed;Wheelchair (measurements PT);Wheelchair cushion (measurements PT) (may change with progress)    Recommendations for Other Services Rehab consult     Precautions / Restrictions Precautions Precautions: Fall Precaution Comments: L neglect Restrictions Weight Bearing Restrictions: No    Mobility  Bed Mobility Overal bed mobility: Needs Assistance Bed Mobility: Supine to Sit     Supine to sit: Mod assist;HOB elevated     General bed mobility comments: cues/faciliation/assist to bring LE's over/off edge of bed, use of right UE on rail and left UE pulling on PTA hand to elevate trunk. Once upright, cues/faciliation with pads under pt to fully scoot to edge of bed for feet to be on the floor.  Transfers Overall transfer level: Needs  assistance Equipment used: 1 person hand held assist Transfers: Sit to/from [facility] Sit to Stand: Min assist Stand pivot transfers: Min assist       General transfer comment: cues for weight shifting to stand from elevated bed surface. then with pt holding onto PTA arms for 3-4 shuffled steps from bed to chair.      Balance Overall balance assessment: Needs assistance Sitting-balance support: No upper extremity supported;Feet supported Sitting balance-Leahy Scale: Fair Sitting balance - Comments: with UE support pt performed LE ex's with supervision for balance   Standing balance support: Bilateral upper extremity supported;During functional activity Standing balance-Leahy Scale: Poor              Cognition Arousal/Alertness: Awake/alert Behavior During Therapy: WFL for tasks assessed/performed Overall Cognitive Status: Impaired/Different from baseline                   Orientation Level: Place Current Attention Level: Sustained Memory: Decreased short-term memory;Decreased recall of precautions Following Commands: Follows one step commands consistently;Follows one step commands with increased time;Follows multi-step commands inconsistently Safety/Judgement: Decreased awareness of safety;Decreased awareness of deficits Awareness: Intellectual Problem Solving: Decreased initiation;Slow processing;Difficulty sequencing;Requires verbal cues;Requires tactile cues General Comments: pt needed cues/faciliation to intiate mobility. once partially at edge of bed pt was cued to scoot forward more for feet to be on floor. Pt then stating "i'm good", however posterior loss of balance if not supported here. Required cues/assist to facilitate remainder of movement to edge of bed. 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 Pt then was able to state he had "brain surgery", as well as correct year when asked. Stated "January" as month.  Reoriented to month of Feb.      Exercises General Exercises - Lower Extremity Long Arc Quad: AAROM;Strengthening;Both;10 reps;Seated;Limitations Long [employer] Limitations: assist needed for full movements and controlled movements Toe Raises: AROM;Strengthening;Both;10 reps;Seated Heel Raises: AROM;Strengthening;Both;10 reps;Seated     Pertinent Vitals/Pain Pain Assessment: 0-10 Pain Score: 0-No pain     PT Goals (current goals can now be found in the care plan section) Acute Rehab PT Goals Patient Stated Goal: to improve PT Goal Formulation: With patient/family Time For Goal Achievement: 11/04/20 Potential to Achieve Goals: Good Progress towards PT goals: Progressing toward goals    Frequency    Min 4X/week      PT Plan Current plan remains appropriate    AM-PAC PT "6 Clicks" Mobility   Outcome Measure  Help needed turning from your back to your side while in a flat bed without using bedrails?: A Lot Help needed moving from lying on your back to sitting on the side of a flat bed without using bedrails?: A Lot Help needed moving to and from a bed to a chair (including a wheelchair)?: A Lot Help needed standing up from a chair using your arms (e.g., wheelchair or bedside chair)?: A Little Help needed to walk in hospital room?: A Lot Help needed climbing 3-5 steps with a railing? : A Lot 6 Click Score: 13    End of Session Equipment Utilized During Treatment: Gait belt Activity Tolerance: Patient tolerated treatment well Patient left: in chair;with call bell/phone within reach;with family/visitor present Nurse Communication: Mobility status PT Visit Diagnosis: Unsteadiness on feet (R26.81);Other abnormalities of gait and mobility (R26.89);Muscle weakness (generalized) (M62.81);Difficulty in walking, not elsewhere classified (R26.2);Other symptoms and signs involving the nervous system (R29.898);Hemiplegia and hemiparesis Hemiplegia - Right/Left: Left Hemiplegia -  dominant/non-dominant: Dominant Hemiplegia - caused by:  (brain mets)     Time: 9371-6967 PT Time Calculation (min) (ACUTE ONLY): 23 min  Charges:  $Therapeutic Activity: 8-22 mins $Neuromuscular Re-education: 8-22 mins                     Willow Ora, PTA, CLT Acute Rehab Services Office- [phone] 10/23/20, 11:30 AM  Willow Ora 10/23/2020, 11:29 AM

## 2020-10-23 NOTE — Telephone Encounter (Signed)
Wife left message requesting to speak w/Dr. [name] to discuss her husband's status, plan of care so she can make plans. MD notified.

## 2020-10-23 NOTE — Plan of Care (Signed)
  Problem: Safety: Goal: Ability to remain free from injury will improve Outcome: Progressing   Problem: Education: Goal: Knowledge of General Education information will improve Description Including pain rating scale, medication(s)/side effects and non-pharmacologic comfort measures Outcome: Progressing   

## 2020-10-23 NOTE — Progress Notes (Signed)
Occupational Therapy Treatment Patient Details Name: Sean [name] MRN: [id number] DOB: Dec 27, 1965 Today's Date: 10/23/2020    History of present illness Pt is a 55 y.o. male who presents with AMS, R side neglect and weakness, aphasia, and urinary incontinence since 1/31. Pt has hx of multiple brain mets and craniotomy with tumor resection on the L 1/14. 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 S/p cycle 1 ipilimumab/nivolumab 1/31 with pt stopping Decadron due to reported interaction with his chemotherapy plan. CT showed significant interval increase in number and size of numerous infratentorial and supratentorial hemorrhagic metastases with significant vasogenic edema.  Resulting effacement of the right lateral ventricle noted. PMH of liver lesion and hyperlipidemia.   OT comments  OT treatment session with focus on functional cognition, bed mobility and patient/family education. Patient limited by fatigue with noted increased difficulty following 1-step verbal commands and motor planning familiar tasks requiring increased external assist this date. Patient continues to be limited by L-sided weakness, decreased cognition, decreased motor planning, decreased vision, and sitting/standing balance deficits indicating continued need for acute OT services. Recommendation for CIR remains appropriate.    Follow Up Recommendations  CIR    Equipment Recommendations  3 in 1 bedside commode;Other (comment) (RW)    Recommendations for Other Services      Precautions / Restrictions Precautions Precautions: Fall Precaution Comments: L neglect, global visual deficits Restrictions Weight Bearing Restrictions: No       Mobility Bed Mobility Overal bed mobility: Needs Assistance Bed Mobility: Sit to Supine     Supine to sit: Mod assist;HOB elevated Sit to supine: Mod assist   General bed mobility comments: Mod A to advance BLE from EOB to bed surface. Patient unable to  sequence/motor plan lateral scoots toward HOB.  Transfers Overall transfer level: Needs assistance Equipment used: 1 person hand held assist Transfers: Sit to/from [facility] Sit to Stand: Min assist Stand pivot transfers: Min assist       General transfer comment: Deferred for patient/therapist safety 2/2 fatigue. Patient unable to motor  plan tasks at this time despite maximal cueing.    Balance Overall balance assessment: Needs assistance Sitting-balance support: No upper extremity supported;Feet supported Sitting balance-Leahy Scale: Fair Sitting balance - Comments: Min guard to Min A with fatigue to maintain static sitting balance.   Standing balance support: Bilateral upper extremity supported;During functional activity Standing balance-Leahy Scale: Poor                             ADL either performed or assessed with clinical judgement   ADL                                               Vision       Perception     Praxis      Cognition Arousal/Alertness: Awake/alert Behavior During Therapy: WFL for tasks assessed/performed Overall Cognitive Status: Impaired/Different from baseline                   Orientation Level: Place Current Attention Level: Sustained Memory: Decreased short-term memory;Decreased recall of precautions Following Commands: Follows one step commands consistently;Follows one step commands with increased time;Follows multi-step commands inconsistently Safety/Judgement: Decreased awareness of safety;Decreased awareness of deficits Awareness: Intellectual Problem Solving: Decreased initiation;Slow processing;Difficulty sequencing;Requires verbal cues;Requires tactile cues General Comments:  Patient follows 1-step verbal commands inconsistently and with increased time requiring Max verbal cues to sequence familiar tasks.        Exercises General Exercises - Lower Extremity Long Arc  Quad: AAROM;Strengthening;Both;10 reps;Seated;Limitations Long [employer] Limitations: assist needed for full movements and controlled movements Toe Raises: AROM;Strengthening;Both;10 reps;Seated Heel Raises: AROM;Strengthening;Both;10 reps;Seated   Shoulder Instructions       General Comments      Pertinent Vitals/ Pain       Pain Assessment: Faces Pain Score: 0-No pain Faces Pain Scale: Hurts little more Pain Location: headache Pain Descriptors / Indicators: Aching Pain Intervention(s): Limited activity within patient's tolerance;Monitored during session  Home Living                                          Prior Functioning/Environment              Frequency  Min 2X/week        Progress Toward Goals  OT Goals(current goals can now be found in the care plan section)  Progress towards OT goals: Not progressing toward goals - comment (Patient limited by fatigue this date.)  Acute Rehab OT Goals Patient Stated Goal: to improve OT Goal Formulation: With patient/family Time For Goal Achievement: 11/04/20 Potential to Achieve Goals: Good ADL Goals Pt Will Perform Grooming: with set-up;sitting Pt Will Perform Upper Body Dressing: with min guard assist;with caregiver independent in assisting;sitting Pt Will Perform Lower Body Dressing: with min guard assist;sit to/from stand Pt Will Transfer to Toilet: with supervision;ambulating Pt Will Perform Toileting - Clothing Manipulation and hygiene: with supervision;sitting/lateral leans Additional ADL Goal #1: Pt will verbalize low vision compensatory strategies for completing ADL with no cues  Plan Discharge plan remains appropriate;Frequency remains appropriate    Co-evaluation                 AM-PAC OT "6 Clicks" Daily Activity     Outcome Measure   Help from another person eating meals?: A Lot Help from another person taking care of personal grooming?: A Lot Help from another person  toileting, which includes using toliet, bedpan, or urinal?: A Lot Help from another person bathing (including washing, rinsing, drying)?: A Lot Help from another person to put on and taking off regular upper body clothing?: A Lot Help from another person to put on and taking off regular lower body clothing?: A Lot 6 Click Score: 12    End of Session Equipment Utilized During Treatment: Gait belt  OT Visit Diagnosis: Unsteadiness on feet (R26.81);Other abnormalities of gait and mobility (R26.89);Muscle weakness (generalized) (M62.81);Low vision, both eyes (H54.2);Other symptoms and signs involving the nervous system (R29.898);Other symptoms and signs involving cognitive function   Activity Tolerance Patient limited by fatigue   Patient Left in bed;with call bell/phone within reach;with family/visitor present;with bed alarm set;with SCD's reapplied   Nurse Communication          Time: 5102-5852 OT Time Calculation (min): 22 min  Charges: OT General Charges $OT Visit: 1 Visit OT Treatments $Self Care/Home Management : 8-22 mins  Jisela Merlino H. OTR/L Supplemental OT, Department of rehab services [phone]   Telford Archambeau R H. 10/23/2020, 3:16 PM

## 2020-10-24 ENCOUNTER — Inpatient Hospital Stay (HOSPITAL_COMMUNITY): Payer: 59

## 2020-10-24 DIAGNOSIS — C7931 Secondary malignant neoplasm of brain: Secondary | ICD-10-CM | POA: Diagnosis not present

## 2020-10-24 DIAGNOSIS — R4182 Altered mental status, unspecified: Secondary | ICD-10-CM

## 2020-10-24 DIAGNOSIS — Z515 Encounter for palliative care: Secondary | ICD-10-CM

## 2020-10-24 DIAGNOSIS — Z7189 Other specified counseling: Secondary | ICD-10-CM

## 2020-10-24 DIAGNOSIS — G936 Cerebral edema: Secondary | ICD-10-CM

## 2020-10-24 LAB — BASIC METABOLIC PANEL
Anion gap: 12 (ref 5–15)
BUN: 21 mg/dL — ABNORMAL HIGH (ref 6–20)
CO2: 23 mmol/L (ref 22–32)
Calcium: 9.2 mg/dL (ref 8.9–10.3)
Chloride: 102 mmol/L (ref 98–111)
Creatinine, Ser: 0.69 mg/dL (ref 0.61–1.24)
GFR, Estimated: >60 mL/min (ref >60)
Glucose, Bld: 110 mg/dL — ABNORMAL HIGH (ref 70–99)
Potassium: 3.4 mmol/L — ABNORMAL LOW (ref 3.5–5.1)
Sodium: 137 mmol/L (ref 135–145)

## 2020-10-24 IMAGING — MR MR HEAD WO/W CM
14 of 16 series · 42 of 48 positions shown · IV contrast (gadavist)
Comparison: None.

CLINICAL DATA: Metastatic melanoma

EXAM:
MRI HEAD WITHOUT AND WITH CONTRAST
TECHNIQUE: Multiplanar, multiecho pulse sequences of the brain and surrounding
structures were obtained without and with intravenous contrast.
CONTRAST:  9mL GADAVIST GADOBUTROL 1 MMOL/ML IV SOLN

[Series 5: DWI · axial · 3.0mm · 0.88mm/px · z∈[-103,+50]mm · 8 of 104 slices shown (1 of 4)]
[im 1/104]
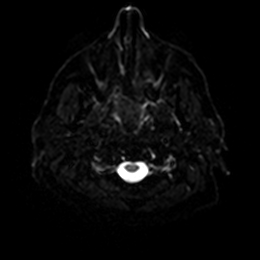
[im 15/104]
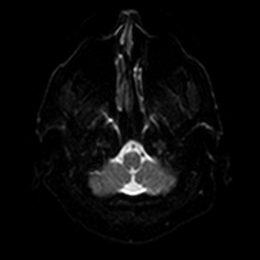
[im 30/104]
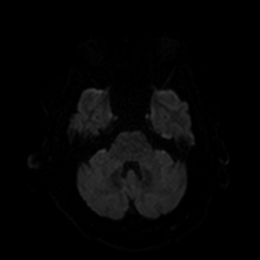
[im 45/104]
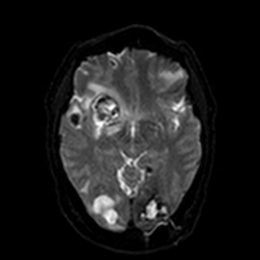
[im 59/104]
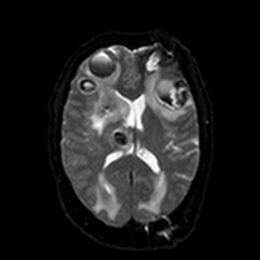
[im 74/104]
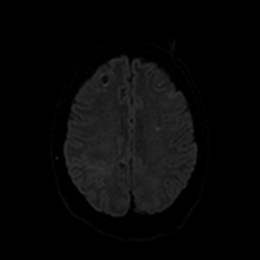
[im 89/104]
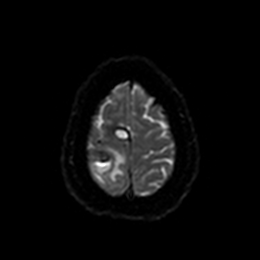
[im 104/104]
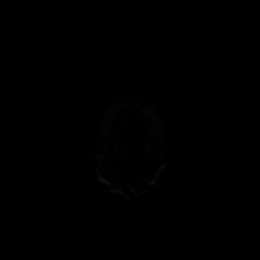

[Series 6: DWI · axial · 3.0mm · 0.88mm/px · z∈[-103,+50]mm · 3 of 52 slices shown (2 of 4)]
[im 1/52]
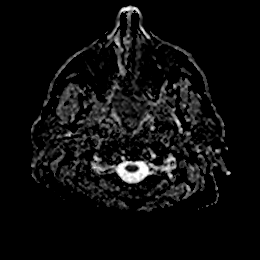
[im 26/52]
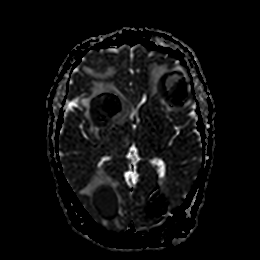
[im 52/52]
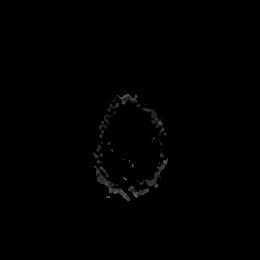

[Series 7: DWI · coronal · 4.0mm · 0.88mm/px · 5 of 68 slices shown (3 of 4)]
[im 1/68]
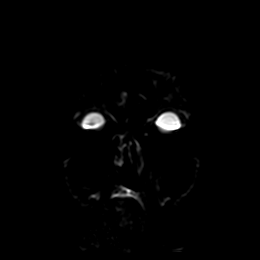
[im 17/68]
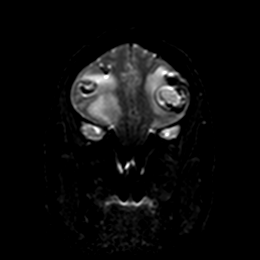
[im 34/68]
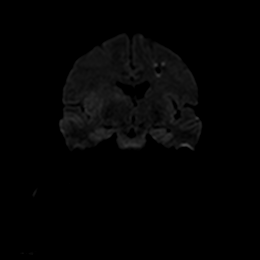
[im 51/68]
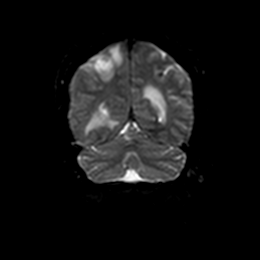
[im 68/68]
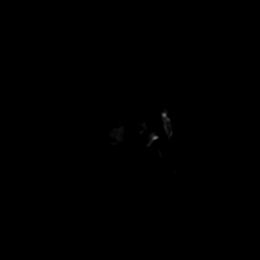

[Series 8: DWI · coronal · 4.0mm · 0.88mm/px · 2 of 34 slices shown (4 of 4)]
[im 1/34]
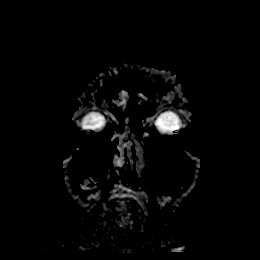
[im 34/34]
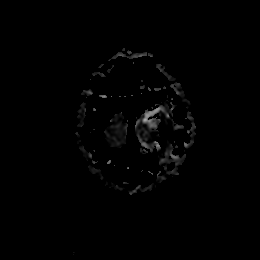

[Series 9: T1 · sagittal · 5.0mm · 0.75mm/px · 2 of 23 slices shown]
[im 1/23]
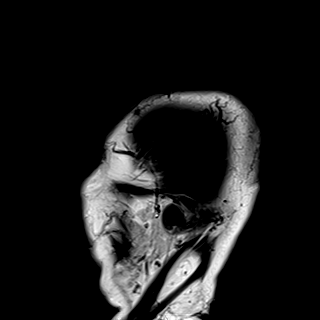
[im 23/23]
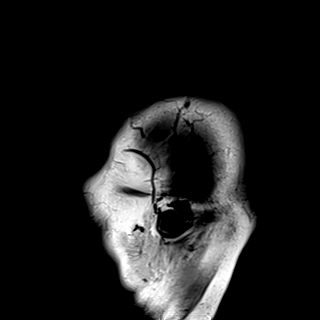

[Series 10: T2 · axial · 5.0mm · 0.72mm/px · z∈[-98,+45]mm · 2 of 25 slices shown]
[im 1/25]
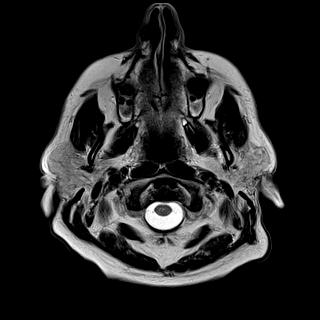
[im 25/25]
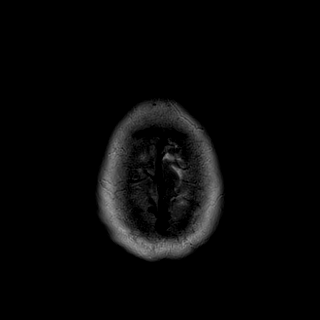

[Series 11: FLAIR · axial · 5.0mm · 0.45mm/px · z∈[-97,+46]mm · 2 of 25 slices shown]
[im 1/25]
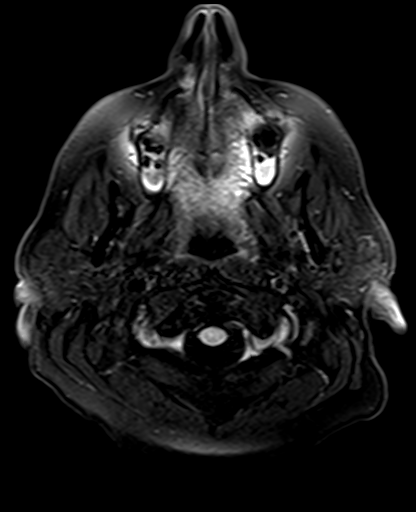
[im 25/25]
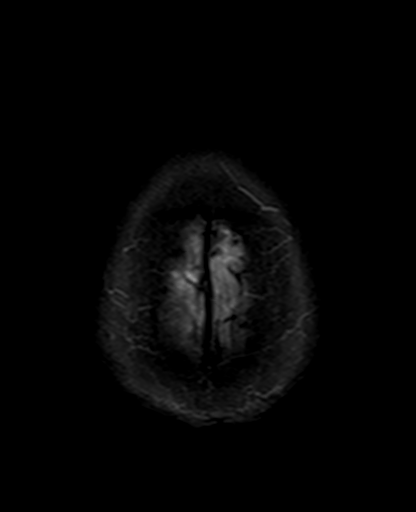

[Series 12: mag_images · axial · 3.0mm · 0.90mm/px · z∈[-102,+51]mm · 3 of 52 slices shown]
[im 1/52]
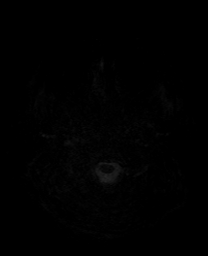
[im 26/52]
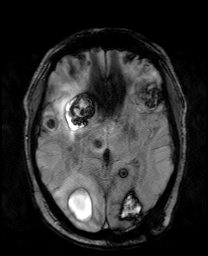
[im 52/52]
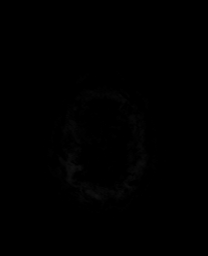

[Series 13: pha_images · axial · 3.0mm · 0.90mm/px · z∈[-102,+51]mm · 3 of 52 slices shown]
[im 1/52]
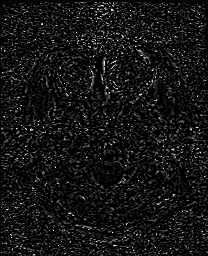
[im 26/52]
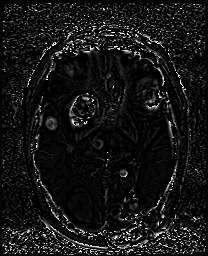
[im 52/52]
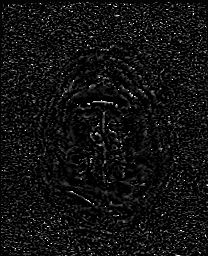

[Series 14: swi_images · axial · 3.0mm · 0.90mm/px · z∈[-102,+51]mm · 3 of 52 slices shown]
[im 1/52]
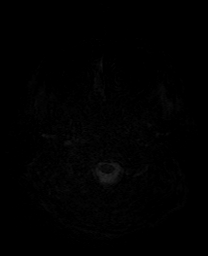
[im 26/52]
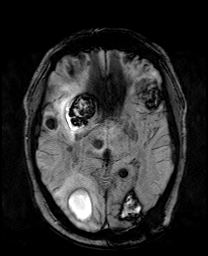
[im 52/52]
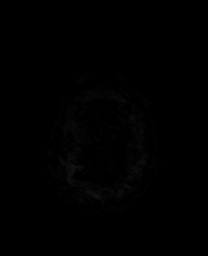

[Series 15: mip_images(sw) · axial · 24.0mm · 0.90mm/px · z∈[-91,+40]mm · 3 of 45 slices shown]
[im 1/45]
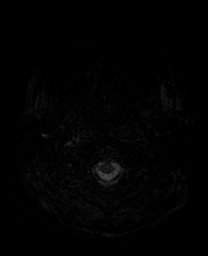
[im 23/45]
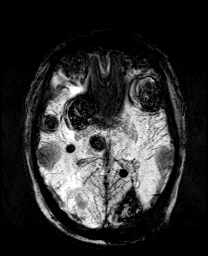
[im 45/45]
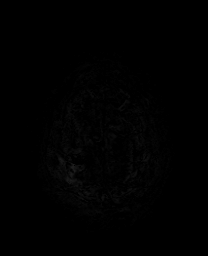

[Series 17: T2 post-contrast · coronal · 5.0mm · 0.72mm/px · 2 of 28 slices shown]
[im 1/28]
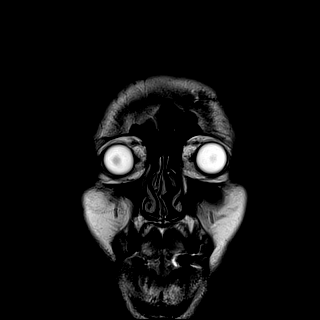
[im 28/28]
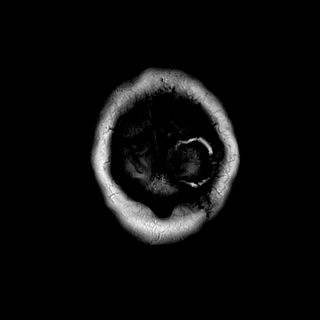

[Series 19: T1 post-contrast · coronal · 5.0mm · 0.34mm/px · 2 of 28 slices shown (1 of 2)]
[im 1/28]
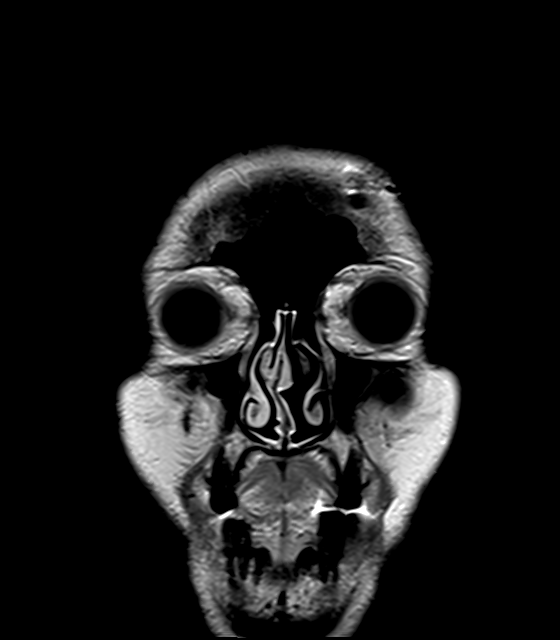
[im 28/28]
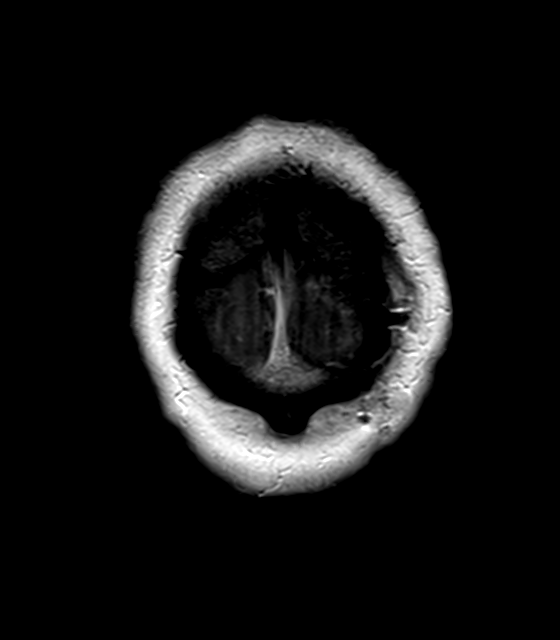

[Series 20: T1 post-contrast · sagittal · 5.0mm · 0.72mm/px · 2 of 23 slices shown (2 of 2)]
[im 1/23]
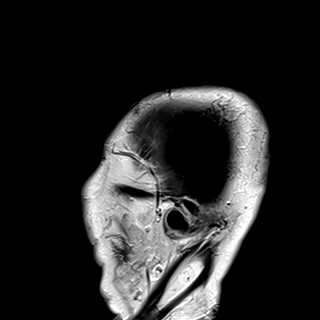
[im 23/23]
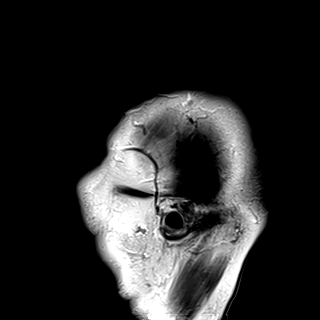

[42 of 48 positions shown; findings below may reference images not displayed]

FINDINGS: Brain: There are numerous lesions throughout the brain, many of
which are new and/or have increased in size. There are approximately
20 lesions. The largest lesions are in the left frontal operculum
(3.4 x 3.0 cm, [DATE]), right basal ganglia (3.5 x 2.5 cm, [DATE]) and
right occipital lobe (2.8 x 1.9 cm, [DATE]). The lesions demonstrate
intrinsic hyperintense T1-weighted signal. Beyond that, there is
little demonstrated contrast enhancement.

There is new mass effect on the right lateral ventricle without
significant midline shift. There is moderate vasogenic edema
surrounding lesions in both frontal lobes, the right basal ganglia,
both occipital lobes and both parietal lobes.

Vascular: Normal flow voids.

Skull and upper cervical spine: Normal marrow signal. Remote left
anterior and posterior craniotomies.

Sinuses/Orbits: Negative.

Other: None.
IMPRESSION: 1. Progression of metastatic melanoma with multiple new or increased
size lesions.
2. Moderate vasogenic edema surrounding lesions in both frontal
lobes, right basal ganglia, both occipital lobes and both parietal
lobes.
3. New mass effect on the right lateral ventricle without
significant midline shift.

## 2020-10-24 MED ORDER — GADOBUTROL 1 MMOL/ML IV SOLN
9.0000 mL | Freq: Once | INTRAVENOUS | Status: AC | PRN
  Administered 2020-10-24: 9 mL via INTRAVENOUS

## 2020-10-24 NOTE — Consult Note (Signed)
Consultation Note Date: 10/24/2020   Patient Name: Sean [name]  DOB: 23-Sep-1965  MRN: [id number]  Age / Sex: 55 y.o., male  PCP: Deland Pretty, MD Referring Physician: Darliss Cheney, MD  Reason for Consultation: Establishing goals of care  HPI/Patient Profile: 55 y.o. 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 CT head without contrast revealed significant interval increase in number and size of numerous hemorrhagic metastases with edema. Receiving IV decadron. Oncology following with plans to continue trial of immunotherapy for systemic disease. Dr. [name] consulting neuro-oncology to evaluate. PT/OT recommending CIR. Palliative medicine consultation for goals of care.    Clinical Assessment and Goals of Care:  I have reviewed medical records, discussed with care team, and met with patient and wife [name]) at bedside. Dwayne is drowsy. He will wake to voice. He is oriented to person and hospital. He is still having intermittent confusion. Does not participate in discussion. Appears comfortable without pain or distress.   I introduced Palliative Medicine as specialized medical care for people living with serious illness. It focuses on providing relief from the symptoms and stress of a serious illness. The goal is to improve quality of life for both the patient and the family.  We discussed a brief life review of the patient. [name] and [place] do not have children but two "fur babies" Art Buff) who bring them great joy. Prior to cancer diagnosis, [place] was independent, active and working out regularly. Also working full time. [name] speaks highly of family support,  especially his sister who will be arriving from [place] soon to help care for him.   Discussed events since cancer diagnosis late 2021. [name] is appreciative of visit from Dr. [name] this morning. She confirms plan for ongoing PT at [facility] and outpatient oncology follow-up with hopes for another round of immunotherapy this month. We also discussed neuro-oncology involvement for recommendations.   [name] is still trying to process his condition and significant health decline since cancer diagnosis.   Discussed future involvement and support with palliative services. Provided AD packet and encouraged her and [place] review and consider completing at some point, as they are faced with tough decisions.   Questions and concerns were addressed. PMT contact information given.     SUMMARY OF RECOMMENDATIONS    Brief initial palliative discussion with wife.   Continue full code/full scope.  Ongoing PT/OT efforts. Hopefully CIR transfer when medically stable.   Plan for oncology f/u with hopes for future immunotherapy.  Pending neuro-oncology recommendations.  May benefit from outpatient palliative services to follow for support and future GOC discussions.   Code Status/Advance Care Planning:  Full code  Symptom Management:   Per attending  Palliative Prophylaxis:   Aspiration, Bowel Regimen, Delirium Protocol and Frequent Pain Assessment  Additional Recommendations (Limitations, Scope, Preferences):  Full Scope Treatment  Psycho-social/Spiritual:  Desire for further Chaplaincy support: yes  Additional Recommendations: Caregiving  Support/Resources  Prognosis:   Unable to determine  Discharge Planning: CIR     Primary Diagnoses: Present on Admission: . Brain metastasis ([place]) . Malignant melanoma metastatic to brain [facility])   I have reviewed the medical record, interviewed the patient and family, and examined the patient. The following aspects are pertinent.  Past  Medical History:  Diagnosis Date  . Brain lesion 09/14/2020  . Hyperlipidemia   . Liver lesion 09/14/2020   Social History   Socioeconomic History  . Marital status: Married    Spouse name: Not on file  . Number of children: 0  . Years of education: Not on file  . Highest education level: Not on file  Occupational History  . Occupation: Heritage manager: [employer]  Tobacco Use  . Smoking status: Former Research scientist (life sciences)  . Smokeless tobacco: Never Used  . Tobacco comment: 09/27/20: per patient quit about over 20 years ago   Vaping Use  . Vaping Use: Never used  Substance and Sexual Activity  . Alcohol use: Yes    Comment: 2-3 glasses of wine a day  . Drug use: No  . Sexual activity: Not on file  Other Topics Concern  . Not on file  Social History Narrative  . Not on file   Social Determinants of Health   Financial Resource Strain: Not on file  Food Insecurity: Not on file  Transportation Needs: Not on file  Physical Activity: Not on file  Stress: Not on file  Social Connections: Not on file   Family History  Problem Relation Age of Onset  . Osteoarthritis Mother   . Hypertension Father   . Colon polyps Father   . Colon polyps Brother   . Colon polyps Brother   . Colon cancer Neg Hx   . Stomach cancer Neg Hx   . Rectal cancer Neg Hx   . Esophageal cancer Neg Hx   . Liver cancer Neg Hx    Scheduled Meds: . dexamethasone (DECADRON) injection  4 mg Intravenous Q12H  . levETIRAcetam  500 mg Oral BID  . pantoprazole  40 mg Oral Daily  . sodium chloride flush  3 mL Intravenous Q12H   Continuous Infusions: PRN Meds:.acetaminophen **OR** acetaminophen, melatonin, ondansetron **OR** ondansetron (ZOFRAN) IV Medications Prior to Admission:  Prior to Admission medications   Medication Sig Start Date End Date Taking? Authorizing Provider  acetaminophen (TYLENOL) 500 MG tablet Take 1,000 mg by mouth every 6 (six) hours as needed for mild pain.   Yes [provider]  HYDROcodone-acetaminophen (NORCO/VICODIN) 5-325 MG tablet Take 1 tablet by mouth every 6 (six) hours as needed for moderate pain. 10/16/20  Yes Owens Shark, NP  levETIRAcetam (KEPPRA) 500 MG tablet Take 1 tablet (500 mg total) by mouth 2 (two) times daily. 10/16/20  Yes Owens Shark, NP  Multiple Vitamin (MULTIVITAMIN WITH MINERALS) TABS tablet Take 1 tablet by mouth daily. Patient taking differently: Take 2 tablets by mouth daily. gummy 09/19/20  Yes Vann, Jessica U, DO  ondansetron (ZOFRAN) 4 MG tablet Take 1 tablet (4 mg total) by mouth daily as needed for nausea or vomiting. 10/16/20 10/16/21 Yes Owens Shark, NP  pantoprazole (PROTONIX) 40 MG tablet Take 1 tablet (40 mg total) by mouth daily at 12 noon. 10/16/20  Yes Owens Shark, NP   No Known Allergies Review of Systems  Unable to perform ROS: Acuity of condition  Physical Exam Vitals and nursing note reviewed.  HENT:     Head: Normocephalic and atraumatic.  Pulmonary:     Effort: No tachypnea, accessory muscle usage or respiratory distress.  Skin:    General: Skin is warm and dry.  Neurological:     Mental Status: He is easily aroused.     Comments: Oriented to person/hospital. Intermittent confusion. Resting this afternoon    Vital Signs: BP 116/72 (BP Location: Left Arm)   Pulse 60   Temp 98.4 F (36.9 C) (Oral)   Resp 16   Ht 5' 11"  (1.803 m)   Wt 90 kg   SpO2 97%   BMI 27.67 kg/m  Pain Scale: 0-10   Pain Score: 0-No pain   SpO2: SpO2: 97 % O2 Device:SpO2: 97 % O2 Flow Rate: .   IO: Intake/output summary:   Intake/Output Summary (Last 24 hours) at 10/24/2020 1027 Last data filed at 10/24/2020 8548 Gross per 24 hour  Intake 360 ml  Output 900 ml  Net -540 ml    LBM: Last BM Date: 10/23/20 Baseline Weight: Weight: 90 kg Most recent weight: Weight: 90 kg     Palliative Assessment/Data: PPS 50%      Time Total: 30 Greater than 50%  of this time was spent counseling and coordinating  care related to the above assessment and plan.  Signed by:  Ihor Dow, DNP, FNP-C Palliative Medicine Team  Phone: [phone] Fax: [phone]   Please contact Palliative Medicine Team phone at [phone] for questions and concerns.  For individual provider: See Shea Evans

## 2020-10-24 NOTE — Progress Notes (Addendum)
HEMATOLOGY-ONCOLOGY PROGRESS NOTE  SUBJECTIVE: Continues to have confusion. Left sided weakness improved. No other complaints.   Oncology History  Melanoma of skin ([place])  10/05/2020 Initial Diagnosis   Melanoma of skin ([place])   10/05/2020 Cancer Staging   Staging form: Melanoma of the Skin, AJCC 8th Edition - Pathologic: Stage IV (pTX, pNX, pM1) - Signed by [name], MD on 10/05/2020   10/16/2020 -  Chemotherapy    Patient is on Treatment Plan: MELANOMA NIVOLUMAB + IPILIMUMAB (1/3) Q21D / NIVOLUMAB Q14D       PHYSICAL EXAMINATION:  Vitals:   10/23/20 2312 10/24/20 0313  BP: 106/80 124/77  Pulse: 65 66  Resp: 16 17  Temp:  97.9 F (36.6 C)  SpO2: 94% 95%   Filed Weights   10/20/20 1701  Weight: 90 kg    Intake/Output from previous day: 02/07 0701 - 02/08 0700 In: 360 [P.O.:360] Out: 900 [Urine:900]  GENERAL:alert, no distress and comfortable SKIN: skin color, texture, turgor are normal, no rashes or significant lesions EYES: normal, Conjunctiva are pink and non-injected, sclera clear OROPHARYNX:no exudate, no erythema and lips, buccal mucosa, and tongue normal  LUNGS: clear to auscultation and percussion with normal breathing effort HEART: regular rate & rhythm and no murmurs and no lower extremity edema ABDOMEN:abdomen soft, non-tender and normal bowel sounds NEURO: Alert, oriented to person and knows that he is being treated for melanoma.  He is not oriented to place.    LABORATORY DATA:  I have reviewed the data as listed CMP Latest Ref Rng & Units 10/22/2020 10/21/2020 10/20/2020  Glucose 70 - 99 mg/dL 167(H) 144(H) 104(H)  BUN 6 - 20 mg/dL 19 17 18   Creatinine 0.61 - 1.24 mg/dL 0.77 0.68 0.74  Sodium 135 - 145 mmol/L 139 139 140  Potassium 3.5 - 5.1 mmol/L 4.0 4.0 3.9  Chloride 98 - 111 mmol/L 105 106 107  CO2 22 - 32 mmol/L 23 22 20(L)  Calcium 8.9 - 10.3 mg/dL 9.0 8.9 9.0  Total Protein 6.5 - 8.1 g/dL - - 6.6  Total Bilirubin 0.3 - 1.2 mg/dL - - 1.5(H)   Alkaline Phos 38 - 126 U/L - - 110  AST 15 - 41 U/L - - 27  ALT 0 - 44 U/L - - 48(H)    Lab Results  Component Value Date   WBC 7.7 10/22/2020   HGB 13.8 10/22/2020   HCT 38.2 (L) 10/22/2020   MCV 87.8 10/22/2020   PLT 207 10/22/2020   NEUTROABS 6.3 10/22/2020    DG Chest 2 View  Result Date: 10/20/2020 CLINICAL DATA:  Altered mental status EXAM: CHEST - 2 VIEW COMPARISON:  PET-CT September 25, 2020 FINDINGS: The heart size and mediastinal contours are within normal limits. Streaky left basilar opacity not significantly changed from prior favor atelectasis. Known right upper lobe pulmonary nodule not visualized on today's exam. No pleural effusion. No pneumothorax. The visualized skeletal structures are unremarkable. IMPRESSION: Streaky left basilar opacity, favor atelectasis though developing infection not excluded. Electronically Signed   By: Dahlia Bailiff MD   On: 10/20/2020 17:55   CT Head Wo Contrast  Result Date: 10/20/2020 CLINICAL DATA:  Mental status change, unknown cause. EXAM: CT HEAD WITHOUT CONTRAST TECHNIQUE: Contiguous axial images were obtained from the base of the skull through the vertex without intravenous contrast. COMPARISON:  MRI head 09/30/20 FINDINGS: Brain: Interval increase in number and size of numerous supratentorial hemorrhagic metastases. The largest lesion on the left measures up to 5 point  1 cm in the left frontal lobe. The largest lesion on the right measures up to 3.3 cm in the right frontal lobe. Of note, there is a hemorrhagic lesion in the right thalamus. There is exuberant vasogenic edema associated with these lesions. No substantial midline shift. There is significant effacement of the right lateral ventricle. No hydrocephalus. Infratentorially, there is an 8 mm hemorrhagic metastasis within the superior right cerebellum. Vascular: No hyperdense vessel identified. Skull: No acute fracture. Prior left frontal and left parietal craniotomy. Sinuses/Orbits: No  acute findings. Other: No mastoid effusions. IMPRESSION: Significant interval increase in number and size of numerous infratentorial and supratentorial hemorrhagic metastases with exuberant edema, as detailed above. Resulting effacement of the right lateral ventricle. An MRI with contrast could provide more complete evaluation if clinically indicated. Findings discussed with Dr. Gilford Raid Via telephone at 6:32 PM. Electronically Signed   By: Margaretha Sheffield MD   On: 10/20/2020 18:35   MR Brain W Wo Contrast  Result Date: 10/24/2020 CLINICAL DATA:  Metastatic melanoma EXAM: MRI HEAD WITHOUT AND WITH CONTRAST TECHNIQUE: Multiplanar, multiecho pulse sequences of the brain and surrounding structures were obtained without and with intravenous contrast. CONTRAST:  87m GADAVIST GADOBUTROL 1 MMOL/ML IV SOLN COMPARISON:  None. FINDINGS: Brain: There are numerous lesions throughout the brain, many of which are new and/or have increased in size. There are approximately 20 lesions. The largest lesions are in the left frontal operculum (3.4 x 3.0 cm, 11:15), right basal ganglia (3.5 x 2.5 cm, 11:13) and right occipital lobe (2.8 x 1.9 cm, 16:26). The lesions demonstrate intrinsic hyperintense T1-weighted signal. Beyond that, there is little demonstrated contrast enhancement. There is new mass effect on the right lateral ventricle without significant midline shift. There is moderate vasogenic edema surrounding lesions in both frontal lobes, the right basal ganglia, both occipital lobes and both parietal lobes. Vascular: Normal flow voids. Skull and upper cervical spine: Normal marrow signal. Remote left anterior and posterior craniotomies. Sinuses/Orbits: Negative. Other: None. IMPRESSION: 1. Progression of metastatic melanoma with multiple new or increased size lesions. 2. Moderate vasogenic edema surrounding lesions in both frontal lobes, right basal ganglia, both occipital lobes and both parietal lobes. 3. New mass effect  on the right lateral ventricle without significant midline shift. Electronically Signed   By: KUlyses JarredM.D.   On: 10/24/2020 03:10   MR BRAIN W WO CONTRAST  Result Date: 09/30/2020 CLINICAL DATA:  Status post craniotomy for tumor section. EXAM: MRI HEAD WITHOUT AND WITH CONTRAST TECHNIQUE: Multiplanar, multiecho pulse sequences of the brain and surrounding structures were obtained without and with intravenous contrast. CONTRAST:  176mGADAVIST GADOBUTROL 1 MMOL/ML IV SOLN COMPARISON:  Brain MRI 09/18/2020 FINDINGS: Brain: There are numerous diffusion restricting lesions throughout both hemispheres, which have associated hemorrhage and areas of contrast enhancement. New or increased sized lesions are as follows: 1. Right occipital lobe, 7 mm, previously 5 mm, series 18, image 24 2. Right basal ganglia, 3 mm, series 18, image 31 3. Right thalamus, 8 mm, image 34, mild surrounding edema, intrinsic hyperintense T1-weighted signal 4. Left frontal operculum, enhancing component 5 mm surrounding area 17 mm, possibly previously present as a punctate lesion, image 35 5. Superior left occipital lobe, 14 mm, previously 10 mm, image 37, mild surrounding edema 6. Anterior right frontal lobe 3 mm, image 37, intrinsic hyperintense T1-weighted signal 7. Left frontal lobe, 6 mm, previously 4 mm, image 44, intrinsic hyperintense T1-weighted signal Unchanged or smaller lesions: 1. Superior right cerebellum, 3 mm,  series 18, image 23 2. Dominant left occipital lesion has been resected. There is minimal residual contrast enhancement adjacent to the occipital horn of the left lateral ventricle, image 32, mild edema 3. Left frontal lobe lesion status post resection with no residual nodular contrast enhancement, image 37, mild edema 4. Superomedial right parietal lobe, 4 mm, image 42 5. Left parietal lobe, 3 mm, image 45 6. Punctate left parietal lobe, image 43 7. Paramedian superior right frontal lobe, 14 mm, image 49, mild edema  with intrinsic hyperintense T1-weighted signal Vascular: Normal flow voids. Skull and upper cervical spine: Left frontal and parietal craniotomies. Sinuses/Orbits: Negative. Other: None. IMPRESSION: 1. Status post resection of left frontal and occipital lesions with minimal residual contrast enhancement near the occipital horn of the left lateral ventricle. 2. 7 lesions that are new or larger. 3. Remainder of lesions are unchanged or smaller. Electronically Signed   By: Ulyses Jarred M.D.   On: 09/30/2020 01:34   NM PET Image Initial (PI) Skull Base To Thigh  Result Date: 09/25/2020 CLINICAL DATA:  Initial treatment strategy for metastatic disease of unknown primary. EXAM: NUCLEAR MEDICINE PET SKULL BASE TO THIGH TECHNIQUE: 11.4 mCi F-18 FDG was injected intravenously. Full-ring PET imaging was performed from the skull base to thigh after the radiotracer. CT data was obtained and used for attenuation correction and anatomic localization. Fasting blood glucose: 83 mg/dl COMPARISON:  Chest abdomen pelvis CT 09/14/2020. FINDINGS: Mediastinal blood pool activity: SUV max 2.4 Liver activity: SUV max NA NECK: No hypermetabolic lymph nodes in the neck. Incidental CT findings: none CHEST: Focal hypermetabolism in the right hilum identified with SUV max = 4.8. No definite lymphadenopathy evident on noncontrast CT imaging today. No other suspicious mediastinal or hilar uptake. 10 mm anterior right upper lobe nodule on 87/3 shows low level hypermetabolism with SUV max = 2.0. Scattered additional tiny pulmonary nodules are too small to characterize by PET imaging. Incidental CT findings: Minimal dependent atelectasis bilaterally. ABDOMEN/PELVIS: Multiple hypermetabolic lesions are seen in the liver,. These appear to be capsular/subcapsular and no abnormality is evident on noncontrast CT imaging. Hypermetabolic lesion adjacent to the falciform ligament in segment III demonstrates SUV max = 6.5. Capsular/subcapsular lesion  medial liver at the porta hepatis demonstrates SUV max = 10.4. A third focus of hypermetabolism is seen in inferior tip of the right liver, again with no CT correlate but SUV max = 7.9. No hypermetabolic lymphadenopathy in the abdomen or pelvis. No hypermetabolic abnormality in the pancreas or spleen. No adrenal hypermetabolism. Focal hypermetabolism identified in the right iliopsoas muscles of the pelvis with SUV max = 5.7. No CT correlate. Focal hypermetabolism identified in the abductor muscles of the medial left thigh ( SUV max = 11.6) without underlying lesion by CT imaging. There is a focus of hypermetabolism musculature of the medial left thigh with SUV max = 5.7. Again, no abnormality on CT imaging at this location. Incidental CT findings: Atherosclerotic calcification noted in the abdominal aorta. SKELETON: Mottled uptake is identified in bony anatomy without definite evidence for bony metastases Incidental CT findings: none IMPRESSION: 1. Focal areas of hypermetabolism identified in the right hilum, multiple areas of the liver adjacent to the capsule, and in musculature of the pelvis and left thigh. No CT correlate in any of these locations on today's noncontrast CT imaging. Nevertheless, imaging features concerning for metastatic disease. Repeat imaging with IV contrast could be used to further evaluate as clinically warranted. 2. 10 mm anterior right upper lobe pulmonary  nodule shows low level hypermetabolism. Additional tiny pulmonary nodules are too small to characterize on PET imaging. 3.  Aortic Atherosclerois (ICD10-170.0) Electronically Signed   By: Misty Stanley M.D.   On: 09/25/2020 14:49    ASSESSMENT AND PLAN: 1.Multiple brain metastases, unknown primary tumor site  CT brain 09/14/2020-multiple hemorrhagic metastases with surrounding edema in the bilateral cerebral hemispheres  CTs chest, abdomen, pelvis 09/14/2020-1.4 cm right upper lobe subpleural nodule, 0.5 cm right lower lobe  nodule, multiple hypodensities in the liver suspicious for metastases, 1.5 cm right upper pole hypodense kidney lesion-indeterminate  MRI abdomen 09/15/2020-multifocal T1 hyperintense liver lesions, no kidney mass  Ultrasound-guided biopsy of a segment 4B liver lesion 09/18/2020-benign liver parenchyma with steatosis, no evidence of malignancy  PET 09/25/2020-focal area of hypermetabolism in the right hilum, multiple areas of the liver, pelvic musculature, and left thigh-no CT correlate with any of these areas on the noncontrast CT, 10 mm anterior right upper lobe nodule with low-level hypermetabolism, additional tiny pulmonary nodules too small to characterize by PET  SRS to 10 brain lesions 09/27/2020  Resection of left occipital and left frontal lesions on 09/29/2020-pathology consistent with malignant melanoma  Cycle 1 ipilimumab/nivolumab 10/16/2020  CT of the brain without contrast 10/26/2020 showed significant interval increase in the number and size of numerous metastases  Brain MRI 10/24/2020-multiple new and increased brain metastases, moderate edema surrounding multiple lesions, new mass-effect at the right lateral ventricle  2.Left-sided weakness, new onset seizure secondary to #1, weakness has resolved 3.Hyperlipidemia 4.Elevated liver enzymes and bilirubin  Liver enzymes more elevated on repeat labs 09/27/2020, improved1/13/2022 5.Headache and nausea/vomiting secondary to #1, resolved 6.  Hospital admission 10/20/2020-encephalopathy  Mr. Siebert appears unchanged. MRI of the brain with and without contrast were performed earlier today which showed progression of metastatic melanoma with multiple new or increased lesions and there was also moderate vasogenic edema. Dr. [name] met with the patient, his wife, and family members earlier today. MRI findings have been discussed with the patient and his family. Recommend continuation of dexamethasone 4 mg twice daily. We will reach  out to neuro-oncology to review MRI and make further recommendations.  From our standpoint, we recommend continuation of immunotherapy as an outpatient. Also recommend admission to CIR when bed is available for rehab.  Recommendations: 1. Continue dexamethasone 4 mg every 12 hours 2. Neuro oncology consult to review MRI of the brain and make additional recommendations 3. Recommend CIR admission for rehab.  We will arrange for outpatient follow-up with cancer center for continuation of immunotherapy.   LOS: 4 days   Sean Bussing, DNP, AGPCNP-BC, AOCNP 10/24/20 Mr. Miskell is interviewed and examined. He is overall status appears unchanged today. I discussed the case with his wife at the bedside. His sister was present by telephone. It remains unclear whether the apparent disease progression in the brain is related to true tumor progression, toxicity from radiation, or a tumor flare from immunotherapy. I reviewed the MRI images with Dr. Mickeal Skinner. He is requesting additional review of the MRI by neuroradiology with specific attention to whether the enlarging lesions were treated with SRS.  We will plan on proceeding with cycle 2 immunotherapy in 2 weeks depending on his clinical status.  I was present for greater than 50% of today's visit. I performed medical decision making.

## 2020-10-24 NOTE — Progress Notes (Signed)
Physical Therapy Treatment Patient Details Name: Sean [name] MRN: [id number] DOB: 1966-04-21 Today's Date: 10/24/2020    History of Present Illness Pt is a 55 y.o. male who presents with AMS, R side neglect and weakness, aphasia, and urinary incontinence since 1/31. Pt has hx of multiple brain mets and craniotomy with tumor resection on the L 1/14. 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 S/p cycle 1 ipilimumab/nivolumab 1/31 with pt stopping Decadron due to reported interaction with his chemotherapy plan. CT showed significant interval increase in number and size of numerous infratentorial and supratentorial hemorrhagic metastases with significant vasogenic edema.  Resulting effacement of the right lateral ventricle noted. PMH of liver lesion and hyperlipidemia.    PT Comments    Pt alert today upon arrival, verbalizes that he wants to rest but easily agreeable to mobility with encouragement. Worked on progressing ambulation in conjunction with STM tasks. Pt able to recall 2/3 words at 5 mins, none at 20 mins until predetermined hints given. Pt ambulated 100' with RW beginning with min A and mod vc's to increase attention to L side. As pt fatigued, required mod A due to L foot drag and L lean. Of note, family mentioned that pt is having some trouble swallowing his food. Sent note to pt's nurse. May need SLP order. Continue to rec CIR. PT will continue to follow.    Follow Up Recommendations  CIR     Equipment Recommendations  Rolling walker with 5" wheels;3in1 (PT);Hospital bed;Wheelchair (measurements PT);Wheelchair cushion (measurements PT) (may change with progress)    Recommendations for Other Services Rehab consult     Precautions / Restrictions Precautions Precautions: Fall Precaution Comments: L neglect, global visual deficits Restrictions Weight Bearing Restrictions: No    Mobility  Bed Mobility Overal bed mobility: Needs Assistance Bed Mobility: Sit to  Supine     Supine to sit: HOB elevated;Min guard Sit to supine: Min assist   General bed mobility comments: vc's for bridging of each knee, vc's for RUE to grasp L rail, vc's for LE's off bed and elevation of trunk. Pt able to perform tasks without physical assist but stopped after each instruction if not cued for the next step. With return to supine, pt left LLE off bed but was able to bring it into bed with tactile cue  Transfers Overall transfer level: Needs assistance   Transfers: Sit to/from Stand Sit to Stand: Min assist         General transfer comment: vc's for hand placement. Min A for safety but no power up needed  Ambulation/Gait Ambulation/Gait assistance: Mod assist;Min assist Gait Distance (Feet): 100 Feet Assistive device: Rolling walker (2 wheeled) Gait Pattern/deviations: Step-through pattern;Decreased step length - left;Decreased stride length;Decreased dorsiflexion - left;Staggering left Gait velocity: reduced Gait velocity interpretation: <1.8 ft/sec, indicate of risk for recurrent falls General Gait Details: vc's needed to attend to obstacles L side. 3x pt able to correct RW placement to go around obstacle, 2x RW had to be moved for him. Pt able to ambulate towards target on floor. Fatigue noted last 30' with decreased L step height and L lean. Mod A needed last 15'.   Stairs             Wheelchair Mobility    Modified Rankin (Stroke Patients Only) Modified Rankin (Stroke Patients Only) Pre-Morbid Rankin Score: No symptoms Modified Rankin: Moderately severe disability     Balance Overall balance assessment: Needs assistance Sitting-balance support: No upper extremity supported;Feet supported Sitting balance-Leahy Scale:  Good Sitting balance - Comments: supervision in sitting. Worked on appropriate placement of L hand at his side and [place] on that hand.   Standing balance support: Bilateral upper extremity supported;During functional  activity Standing balance-Leahy Scale: Poor Standing balance comment: min A needed for safety                            Cognition Arousal/Alertness: Awake/alert Behavior During Therapy: Flat affect Overall Cognitive Status: Impaired/Different from baseline Area of Impairment: Attention;Memory;Following commands;Safety/judgement;Awareness;Problem solving;Orientation                 Orientation Level: Place Current Attention Level: Sustained Memory: Decreased short-term memory;Decreased recall of precautions Following Commands: Follows one step commands consistently;Follows one step commands with increased time;Follows multi-step commands inconsistently Safety/Judgement: Decreased awareness of safety;Decreased awareness of deficits Awareness: Intellectual Problem Solving: Decreased initiation;Slow processing;Difficulty sequencing;Requires verbal cues;Requires tactile cues General Comments: followed 1 step commands with increased time and sometimes the need for a tactile cue. Worked on 3 word recall. After 5 mins pt could recall 2/3 words. After 20 mins, pt recalled 0/3 but 2/3 with visual hints that were chosen beforehand.      Exercises General Exercises - Lower Extremity Hip Flexion/Marching: AROM;Left;10 reps;Seated;Standing    General Comments General comments (skin integrity, edema, etc.): VSS. Pt's brother present      Pertinent Vitals/Pain Pain Assessment: Faces Faces Pain Scale: No hurt    Home Living                      Prior Function            PT Goals (current goals can now be found in the care plan section) Acute Rehab PT Goals Patient Stated Goal: to improve PT Goal Formulation: With patient/family Time For Goal Achievement: 11/04/20 Potential to Achieve Goals: Good Progress towards PT goals: Progressing toward goals    Frequency    Min 4X/week      PT Plan Current plan remains appropriate    Co-evaluation               AM-PAC PT "6 Clicks" Mobility   Outcome Measure  Help needed turning from your back to your side while in a flat bed without using bedrails?: A Lot Help needed moving from lying on your back to sitting on the side of a flat bed without using bedrails?: A Lot Help needed moving to and from a bed to a chair (including a wheelchair)?: A Lot Help needed standing up from a chair using your arms (e.g., wheelchair or bedside chair)?: A Little Help needed to walk in hospital room?: A Lot Help needed climbing 3-5 steps with a railing? : A Lot 6 Click Score: 13    End of Session Equipment Utilized During Treatment: Gait belt Activity Tolerance: Patient tolerated treatment well Patient left: with call bell/phone within reach;with family/visitor present;in bed;with bed alarm set Nurse Communication: Mobility status PT Visit Diagnosis: Unsteadiness on feet (R26.81);Other abnormalities of gait and mobility (R26.89);Muscle weakness (generalized) (M62.81);Difficulty in walking, not elsewhere classified (R26.2);Other symptoms and signs involving the nervous system (R29.898);Hemiplegia and hemiparesis Hemiplegia - Right/Left: Left Hemiplegia - dominant/non-dominant: Dominant Hemiplegia - caused by:  (brain mets)     Time: 0912-0956 PT Time Calculation (min) (ACUTE ONLY): 44 min  Charges:  $Gait Training: 23-37 mins $Therapeutic Activity: 8-22 mins  Leighton Roach, PT  Acute Rehab Services  Pager [phone] Office [place] 10/24/2020, 10:29 AM

## 2020-10-24 NOTE — Progress Notes (Signed)
Occupational Therapy Treatment Patient Details Name: Domonique Brouillard MRN: [id number] DOB: 1966/02/29 Today's Date: 10/24/2020    History of present illness Pt is a 55 y.o. male who presents with AMS, R side neglect and weakness, aphasia, and urinary incontinence since 1/31. Pt has hx of multiple brain mets and craniotomy with tumor resection on the L 1/14. 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 S/p cycle 1 ipilimumab/nivolumab 1/31 with pt stopping Decadron due to reported interaction with his chemotherapy plan. CT showed significant interval increase in number and size of numerous infratentorial and supratentorial hemorrhagic metastases with significant vasogenic edema.  Resulting effacement of the right lateral ventricle noted. PMH of liver lesion and hyperlipidemia.   OT comments  OT treatment session with focus on patient/family education. Patient reports fatigue from previous PT session. Wife would like to begin preparing the home for patient's return after CIR admission. Education provided on sponge bathing vs. tub/shower tranfers with use of TTB. Wife with questions on types of grab bars available and placement. Education provided on difference between grab bar styles and purpose (weight bearing vs steadying). Wife expressed verbal understanding. Education also provided on suspected need for DME with education on acquisition and use of TTB. Wife would like for patient to bathe at shower level with OT this week but declined shower this date 2/2 patient fatigue. Tentative date scheduled for time when wife is able to be present for education. OT will continue to follow acutely.    Follow Up Recommendations  CIR    Equipment Recommendations  3 in 1 bedside commode;Other (comment);Tub/shower bench (grab bars in shower, rolling walker)    Recommendations for Other Services      Precautions / Restrictions Precautions Precautions: Fall Precaution Comments: L neglect, global visual  deficits Restrictions Weight Bearing Restrictions: No       Mobility Bed Mobility Overal bed mobility: Needs Assistance Bed Mobility: Sit to Supine     Supine to sit: HOB elevated;Min guard Sit to supine: Min assist   General bed mobility comments: vc's for bridging of each knee, vc's for RUE to grasp L rail, vc's for LE's off bed and elevation of trunk. Pt able to perform tasks without physical assist but stopped after each instruction if not cued for the next step. With return to supine, pt left LLE off bed but was able to bring it into bed with tactile cue  Transfers Overall transfer level: Needs assistance   Transfers: Sit to/from Stand Sit to Stand: Min assist         General transfer comment: Deferred 2/2 fatigue from previous PT session.    Balance Overall balance assessment: Needs assistance Sitting-balance support: No upper extremity supported;Feet supported Sitting balance-Leahy Scale: Good Sitting balance - Comments: supervision in sitting. Worked on appropriate placement of L hand at his side and [place] on that hand.   Standing balance support: Bilateral upper extremity supported;During functional activity Standing balance-Leahy Scale: Poor Standing balance comment: min A needed for safety                           ADL either performed or assessed with clinical judgement   ADL                                               Vision  Perception     Praxis      Cognition Arousal/Alertness: Awake/alert Behavior During Therapy: Flat affect Overall Cognitive Status: Impaired/Different from baseline Area of Impairment: Attention;Memory;Following commands;Safety/judgement;Awareness;Problem solving;Orientation                 Orientation Level: Place Current Attention Level: Sustained Memory: Decreased short-term memory;Decreased recall of precautions Following Commands: Follows one step commands  consistently;Follows one step commands with increased time;Follows multi-step commands inconsistently Safety/Judgement: Decreased awareness of safety;Decreased awareness of deficits Awareness: Intellectual Problem Solving: Decreased initiation;Slow processing;Difficulty sequencing;Requires verbal cues;Requires tactile cues General Comments: followed 1 step commands with increased time and sometimes the need for a tactile cue. Worked on 3 word recall. After 5 mins pt could recall 2/3 words. After 20 mins, pt recalled 0/3 but 2/3 with visual hints that were chosen beforehand.        Exercises Exercises: General Lower Extremity General Exercises - Lower Extremity Hip Flexion/Marching: AROM;Left;10 reps;Seated;Standing   Shoulder Instructions       General Comments Wife [name] present at bedside. Session with focus on family education.    Pertinent Vitals/ Pain       Pain Assessment: Faces Faces Pain Scale: No hurt Pain Intervention(s): Monitored during session  Home Living                                          Prior Functioning/Environment              Frequency  Min 2X/week        Progress Toward Goals  OT Goals(current goals can now be found in the care plan section)  Progress towards OT goals: Progressing toward goals  Acute Rehab OT Goals Patient Stated Goal: to improve OT Goal Formulation: With patient/family Time For Goal Achievement: 11/04/20 Potential to Achieve Goals: Good ADL Goals Pt Will Perform Grooming: with set-up;sitting Pt Will Perform Upper Body Dressing: with min guard assist;with caregiver independent in assisting;sitting Pt Will Perform Lower Body Dressing: with min guard assist;sit to/from stand Pt Will Transfer to Toilet: with supervision;ambulating Pt Will Perform Toileting - Clothing Manipulation and hygiene: with supervision;sitting/lateral leans Additional ADL Goal #1: Pt will verbalize low vision compensatory  strategies for completing ADL with no cues  Plan Discharge plan remains appropriate;Frequency remains appropriate    Co-evaluation                 AM-PAC OT "6 Clicks" Daily Activity     Outcome Measure   Help from another person eating meals?: A Lot Help from another person taking care of personal grooming?: A Lot Help from another person toileting, which includes using toliet, bedpan, or urinal?: A Lot Help from another person bathing (including washing, rinsing, drying)?: A Lot Help from another person to put on and taking off regular upper body clothing?: A Lot Help from another person to put on and taking off regular lower body clothing?: A Lot 6 Click Score: 12    End of Session Equipment Utilized During Treatment: Gait belt  OT Visit Diagnosis: Unsteadiness on feet (R26.81);Other abnormalities of gait and mobility (R26.89);Muscle weakness (generalized) (M62.81);Low vision, both eyes (H54.2);Other symptoms and signs involving the nervous system (R29.898);Other symptoms and signs involving cognitive function   Activity Tolerance Patient limited by fatigue   Patient Left in bed;with call bell/phone within reach;with family/visitor present;with bed alarm set;with SCD's reapplied   Nurse Communication  Time: 0177-9390 OT Time Calculation (min): 14 min  Charges: OT General Charges $OT Visit: 1 Visit OT Treatments $Therapeutic Activity: 8-22 mins  Bernece Gall H. OTR/L Supplemental OT, Department of rehab services [phone]   Nicholson Starace R H. 10/24/2020, 2:09 PM

## 2020-10-24 NOTE — Progress Notes (Signed)
OT Cancellation Note  Patient Details Name: Dmitri Pettigrew MRN: [id number] DOB: 30-Sep-1965   Cancelled Treatment:    Reason Eval/Treat Not Completed: OT with intention on providing patient/family education but wife not present in room. At time of 2nd attempt, patient/family in meeting to discuss POC. OT to check back as time allows.   Gloris Manchester OTR/L Supplemental OT, Department of rehab services [phone]  Sherlonda Flater R H. 10/24/2020, 10:56 AM

## 2020-10-24 NOTE — Progress Notes (Addendum)
Inpatient Rehabilitation Admissions Coordinator  I met at bedside with patient , his brother and his wife. I await insurance approval for CIR and bed availability. I have ordered SLP eval for swallow and cognition.  Danne Baxter, RN, MSN Rehab Admissions Coordinator [phone] 10/24/2020 11:27 AM

## 2020-10-24 NOTE — H&P (Incomplete)
Physical Medicine and Rehabilitation Admission H&P    Chief Complaint  Patient presents with  . Weakness  . Fatigue  . stroke like symptoms  : HPI: Sean [name] is a 55 year old right-handed male with history significant for 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 Betsy Coder of oncology services and recently placed on dexamethasone with slow taper.  Per chart review lives with spouse.  1 level home 2 steps to entry.  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  Good family support.  Presented 10/20/2020 with right side weakness and aphasia as well as fatigue x4 days.  Admission chemistries unremarkable aside glucose 104, ALT 48, hemoglobin 15, urine culture multiple species, lactic acid 0.9.  CT of the head showed significant interval increase in number and size of numerous infratentorial and supratentorial hemorrhagic metastasis with exuberant edema.  Resulting effacement of the right lateral ventricle.  Recent MRI of abdomen 09/15/2020 multifocal T1 hyperintense liver lesions, no kidney mass.  He underwent ultrasound-guided biopsy of a segment 4B liver lesion 09/18/2020 benign liver parenchyma with steatosis, no evidence of malignancy.  PET 09/25/2020 focal area of hypermetabolism in the right hilum, multiple areas of the liver, pelvic musculature, and left thigh-no CT correlate with any of these areas on the noncontrast CT, 10 mm anterior right upper lobe nodule with low-level hypermetabolism, additional tiny pulmonary nodules too small to characterize by PET.  Underwent resection of left occipital left frontal lesions 09/29/2020 pathology consistent with malignant melanoma.  MRI of the brain completed 10/24/2020 shows progression of metastatic melanoma with multiple new or increased size lesions.  Moderate vasogenic edema surrounding lesions in both frontal lobes right basal ganglia both  occipital lobes and both parietal lobes.  New mass-effect on the right lateral ventricle without significant midline shift.  Oncology follow-up Dr. Alvy Bimler as well as follow-up Dr. Mickeal Skinner and remains on high-dose intravenous Decadron.  Keppra initiated for CVA prophylaxis.  Palliative care consulted to establish goals of care.  Patient tolerating a regular consistency diet.  Therapy evaluations completed due to decline in ADLs and mobility patient was admitted for a comprehensive rehab program.  Review of Systems  Constitutional: Negative for chills and fever.  HENT: Negative for hearing loss.   Eyes: Positive for blurred vision. Negative for double vision.  Respiratory: Negative for cough and shortness of breath.   Cardiovascular: Negative for chest pain, palpitations and leg swelling.  Gastrointestinal: Positive for constipation. Negative for heartburn, nausea and vomiting.  Genitourinary: Negative for dysuria, flank pain and hematuria.       Bouts of urinary incontinence  Musculoskeletal: Positive for myalgias.  Skin: Negative for rash.  Neurological: Positive for dizziness, speech change, weakness and headaches.  All other systems reviewed and are negative.  Past Medical History:  Diagnosis Date  . Brain lesion 09/14/2020  . Hyperlipidemia   . Liver lesion 09/14/2020   Past Surgical History:  Procedure Laterality Date  . APPLICATION OF CRANIAL NAVIGATION  09/29/2020   Procedure: APPLICATION OF CRANIAL NAVIGATION;  Surgeon: Judith Part, MD;  Location: [facility];  Service: Neurosurgery;;  . Kyla Balzarine Left 09/29/2020   Procedure: Left craniotomy for tumor resection with brainlab;  Surgeon: Judith Part, MD;  Location: [facility];  Service: Neurosurgery;  Laterality: Left;  . HERNIA REPAIR     left side inguinal hernia about 18-20 years   Family History  Problem Relation Age of Onset  .  Osteoarthritis Mother   . Hypertension Father   . Colon polyps Father   . Colon polyps  Brother   . Colon polyps Brother   . Colon cancer Neg Hx   . Stomach cancer Neg Hx   . Rectal cancer Neg Hx   . Esophageal cancer Neg Hx   . Liver cancer Neg Hx    Social History:  reports that he has quit smoking. He has never used smokeless tobacco. He reports current alcohol use. He reports that he does not use drugs. Allergies: No Known Allergies Medications Prior to Admission  Medication Sig Dispense Refill  . acetaminophen (TYLENOL) 500 MG tablet Take 1,000 mg by mouth every 6 (six) hours as needed for mild pain.    Marland Kitchen HYDROcodone-acetaminophen (NORCO/VICODIN) 5-325 MG tablet Take 1 tablet by mouth every 6 (six) hours as needed for moderate pain. 30 tablet 0  . levETIRAcetam (KEPPRA) 500 MG tablet Take 1 tablet (500 mg total) by mouth 2 (two) times daily. 60 tablet 0  . Multiple Vitamin (MULTIVITAMIN WITH MINERALS) TABS tablet Take 1 tablet by mouth daily. (Patient taking differently: Take 2 tablets by mouth daily. gummy)    . ondansetron (ZOFRAN) 4 MG tablet Take 1 tablet (4 mg total) by mouth daily as needed for nausea or vomiting. 30 tablet 0  . pantoprazole (PROTONIX) 40 MG tablet Take 1 tablet (40 mg total) by mouth daily at 12 noon. 30 tablet 0    Drug Regimen Review Drug regimen was reviewed and remains appropriate with no significant issues identified  Home: Home Living Family/patient expects to be discharged to:: Private residence Living Arrangements: Spouse/significant other Available Help at Discharge:  (pt's siblings can assist when wife works) Type of Home: House Home Access: Stairs to enter Technical brewer of Steps: 2 Entrance Stairs-Rails: None Home Layout: One level Bathroom Shower/Tub: Chiropodist: Handicapped height Bathroom Accessibility: Yes Home Equipment: None Additional Comments: 2 dogs  Lives With: Spouse   Functional History: Prior Function Level of Independence: Independent Comments: Glass blower/designer, 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  Functional Status:  Mobility: Bed Mobility Overal bed mobility: Needs Assistance Bed Mobility: Sit to Supine Rolling: Min assist Supine to sit: Mod assist,HOB elevated Sit to supine: Mod assist General bed mobility comments: Mod A to advance BLE from EOB to bed surface. Patient unable to sequence/motor plan lateral scoots toward HOB. Transfers Overall transfer level: Needs assistance Equipment used: 1 person hand held assist Transfers: Sit to/from [place] to Stand: Min assist Stand pivot transfers: Min assist General transfer comment: Deferred for patient/therapist safety 2/2 fatigue. Patient unable to motor  plan tasks at this time despite maximal cueing. Ambulation/Gait Ambulation/Gait assistance: Mod assist Gait Distance (Feet): 60 Feet Assistive device: Rolling walker (2 wheeled) Gait Pattern/deviations: Step-through pattern,Decreased step length - left,Decreased stride length,Decreased dorsiflexion - left,Staggering left General Gait Details: Constant assistance at RW due to pt pushing it distal to his body despite cues to correct. Pt ambulates at slow pace with unsteadiness noted, drifting to L side of RW. Neglect of L side with L foot getting lateral to RW leg on occasion and L hand releasing from RW grip. Decreased L step length and foot clearance. ModA for management of RW and to maintain safety. Gait velocity: reduced Gait velocity interpretation: <1.8 ft/sec, indicate of risk for recurrent falls    ADL: ADL Overall ADL's : Needs assistance/impaired Eating/Feeding: Moderate assistance,With caregiver independent assisting,Sitting Grooming: Sitting,Wash/dry hands,Minimal  assistance Upper Body Bathing: Moderate assistance,Sitting Lower Body Bathing: Moderate assistance,Sitting/lateral leans Upper Body Dressing : Moderate assistance,Sitting Lower Body Dressing: Maximal assistance,Sit to/from  stand Toilet Transfer: Minimal assistance,Stand-pivot,BSC Toilet Transfer Details (indicate cue type and reason): one person HHA Toileting- Clothing Manipulation and Hygiene: Moderate assistance,+2 for safety/equipment,Sit to/from stand Toileting - Clothing Manipulation Details (indicate cue type and reason): Pt able to stand with OT and wife performing rear peri care Functional mobility during ADLs: Moderate assistance,Cueing for sequencing (1 person HHA, SPT from bed>BSC>bed) General ADL Comments: Pt with deficits in coordination, cognition, vision, expressive language?, balance  Cognition: Cognition Overall Cognitive Status: Impaired/Different from baseline Orientation Level: Oriented X4 Cognition Arousal/Alertness: Awake/alert Behavior During Therapy: WFL for tasks assessed/performed Overall Cognitive Status: Impaired/Different from baseline Area of Impairment: Attention,Memory,Following commands,Safety/judgement,Awareness,Problem solving,Orientation Orientation Level: Place Current Attention Level: Sustained Memory: Decreased short-term memory,Decreased recall of precautions Following Commands: Follows one step commands consistently,Follows one step commands with increased time,Follows multi-step commands inconsistently Safety/Judgement: Decreased awareness of safety,Decreased awareness of deficits Awareness: Intellectual Problem Solving: Decreased initiation,Slow processing,Difficulty sequencing,Requires verbal cues,Requires tactile cues General Comments: Patient follows 1-step verbal commands inconsistently and with increased time requiring Max verbal cues to sequence familiar tasks.  Physical Exam: Blood pressure 124/77, pulse 66, temperature 97.9 F (36.6 C), temperature source Oral, resp. rate 17, height 5\' 11"  (1.803 m), weight 90 kg, SpO2 95 %. Physical Exam Neurological:     Comments: Patient is alert in no acute distress.  Provides his name and age.  Follows simple  commands.  He did have some delay in processing on naming place.     No results found for this or any previous visit (from the past 48 hour(s)). MR Brain W Wo Contrast  Result Date: 10/24/2020 CLINICAL DATA:  Metastatic melanoma EXAM: MRI HEAD WITHOUT AND WITH CONTRAST TECHNIQUE: Multiplanar, multiecho pulse sequences of the brain and surrounding structures were obtained without and with intravenous contrast. CONTRAST:  44mL GADAVIST GADOBUTROL 1 MMOL/ML IV SOLN COMPARISON:  None. FINDINGS: Brain: There are numerous lesions throughout the brain, many of which are new and/or have increased in size. There are approximately 20 lesions. The largest lesions are in the left frontal operculum (3.4 x 3.0 cm, 11:15), right basal ganglia (3.5 x 2.5 cm, 11:13) and right occipital lobe (2.8 x 1.9 cm, 16:26). The lesions demonstrate intrinsic hyperintense T1-weighted signal. Beyond that, there is little demonstrated contrast enhancement. There is new mass effect on the right lateral ventricle without significant midline shift. There is moderate vasogenic edema surrounding lesions in both frontal lobes, the right basal ganglia, both occipital lobes and both parietal lobes. Vascular: Normal flow voids. Skull and upper cervical spine: Normal marrow signal. Remote left anterior and posterior craniotomies. Sinuses/Orbits: Negative. Other: None. IMPRESSION: 1. Progression of metastatic melanoma with multiple new or increased size lesions. 2. Moderate vasogenic edema surrounding lesions in both frontal lobes, right basal ganglia, both occipital lobes and both parietal lobes. 3. New mass effect on the right lateral ventricle without significant midline shift. Electronically Signed   By: Ulyses Jarred M.D.   On: 10/24/2020 03:10       Medical Problem List and Plan: 1.  Right side weakness with word finding deficits secondary to malignant melanoma with numerous left brain mets.  Follow-up oncology service Dr. Betsy Coder as  well as Dr. Mickeal Skinner ongoing plan of care.  Continue Decadron as directed  -patient may *** shower  -ELOS/Goals: *** 2.  Antithrombotics: -DVT/anticoagulation: SCDs  -antiplatelet therapy: N/A 3. Pain  Management: Tylenol as needed 4. Mood: Melatonin 5 mg nightly as needed sleep  -antipsychotic agents: N/A 5. Neuropsych: This patient is capable of making decisions on his own behalf. 6. Skin/Wound Care: Routine skin checks 7. Fluids/Electrolytes/Nutrition: Routine in and outs with follow-up chemistries 8.  Seizure prophylaxis.  Keppra 500 mg twice daily    ***  Cathlyn Parsons, PA-C 10/24/2020

## 2020-10-24 NOTE — Progress Notes (Signed)
PROGRESS NOTE    Sean [name]  JJH:[id number] DOB: Dec 16, 1965 DOA: 10/20/2020 PCP: Deland Pretty, MD   Brief Narrative:  Sean [name] is a 55 y.o. 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  Upon presentation to ED, he was hemodynamically stable.  All basic labs within normal range.  CT head without contrast showed significant interval increase in number and size of numerous infratentorial and supratentorial hemorrhagic metastases with exuberant edema.  Resulting effacement of the right lateral ventricle noted.  EDP discussed with on-call oncology who recommended starting IV Decadron 4 mg every 6 hours.  Patient also given 1 L normal saline and admitted to hospital service.  Oncology consulted.  Evaluated by PT OT who recommended CIR. MRI brain with and without contrast shows worsening metastasis with vasogenic edema.  Oncology recommends continuing twice daily dose of steroids.  Waiting for insurance authorization for discharge to CIR.  Assessment & Plan:   Principal Problem:   Brain metastasis ([place]) Active Problems:   Malignant melanoma metastatic to brain ([place])  Acute metabolic encephalopathy and left-sided weakness secondary to malignant melanoma with hemorrhagic brain metastasis and vasogenic edema: Interestingly, patient is much more oriented today compared to yesterday for example, he is able to tell me the current location, current month, year but he could not tell me POTUS and he is not recognizing his own wife today.  MRI brain with and without contrast done.  Patient carries poor prognosis.  Wife tells me that oncologist discussed with her today and that they want to see how he does  over the next couple of weeks.    DVT prophylaxis: SCDs Start: 10/20/20 2105   Code Status: Full Code  Family Communication: Discussed with wife at the bedside  Status is: Inpatient  Remains inpatient appropriate because:Inpatient level of care appropriate due to severity of illness   Dispo: The patient is from: Home              Anticipated d/c is to: CIR              Anticipated d/c date is: 10/25/2020, waiting for insurance authorization              Patient currently is medically stable to d/c.   Difficult to place patient No        Estimated body mass index is 27.67 kg/m as calculated from the following:   Height as of this encounter: 5\' 11"  (1.803 m).   Weight as of this encounter: 90 kg.      Nutritional status:               Consultants:   Oncology  Procedures:   None  Antimicrobials:  Anti-infectives (From admission, onward)   None         Subjective: Seen and examined.  Remains alert and more oriented today.  Denies any complaint.  Left-sided weakness has improved significantly to the point that his left upper extremity strength is back to normal and left lower extremity is very close to normal.  Objective: Vitals:   10/23/20 1930 10/23/20 2312 10/24/20 0313 10/24/20 0756  BP: 125/76 106/80 124/77 116/72  Pulse: 69 65 66 60  Resp: 18 16 17 16   Temp: 97.8 F (36.6 C)  97.9 F (36.6 C) 98.4 F (36.9 C)  TempSrc: Oral  Oral Oral  SpO2: 98% 94% 95% 97%  Weight:      Height:        Intake/Output Summary (Last 24 hours) at 10/24/2020 1024 Last data filed at 10/24/2020 1607 Gross per 24 hour  Intake 360 ml  Output 900 ml  Net -540 ml   Filed Weights   10/20/20 1701  Weight: 90 kg    Examination:  General exam: Appears calm and comfortable  Respiratory system: Clear to auscultation. Respiratory effort normal. Cardiovascular system: S1 & S2 heard, RRR. No JVD, murmurs, rubs, gallops or clicks. No pedal edema. Gastrointestinal  system: Abdomen is nondistended, soft and nontender. No organomegaly or masses felt. Normal bowel sounds heard. Central nervous system: Alert but partly oriented.  Slight weakness in the left lower extremity. Extremities: Symmetric 5 x 5 power. Skin: No rashes, lesions or ulcers.  Psychiatry: Judgement and insight appear poor   Data Reviewed: I have personally reviewed following labs and imaging studies  CBC: Recent Labs  Lab 10/20/20 1729 10/21/20 0610 10/22/20 0219  WBC 6.2 6.2 7.7  NEUTROABS 4.7  --  6.3  HGB 15.0 14.0 13.8  HCT 41.6 40.1 38.2*  MCV 89.3 88.5 87.8  PLT 200 185 371   Basic Metabolic Panel: Recent Labs  Lab 10/20/20 1729 10/21/20 0610 10/22/20 0219  NA 140 139 139  K 3.9 4.0 4.0  CL 107 106 105  CO2 20* 22 23  GLUCOSE 104* 144* 167*  BUN 18 17 19   CREATININE 0.74 0.68 0.77  CALCIUM 9.0 8.9 9.0   GFR: Estimated Creatinine Clearance: 109.8 mL/min (by C-G formula based on SCr of 0.77 mg/dL). Liver Function Tests: Recent Labs  Lab 10/20/20 1729  AST 27  ALT 48*  ALKPHOS 110  BILITOT 1.5*  PROT 6.6  ALBUMIN 3.3*   No results for input(s): LIPASE, AMYLASE in the last 168 hours. No results for input(s): AMMONIA in the last 168 hours. Coagulation Profile: No results for input(s): INR, PROTIME in the last 168 hours. Cardiac Enzymes: No results for input(s): CKTOTAL, CKMB, CKMBINDEX, TROPONINI in the last 168 hours. BNP (last 3 results) No results for input(s): PROBNP in the last 8760 hours. HbA1C: No results for input(s): HGBA1C in the last 72 hours. CBG: No results for input(s): GLUCAP in the last 168 hours. Lipid Profile: No results for input(s): CHOL, HDL, LDLCALC, TRIG, CHOLHDL, LDLDIRECT in the last 72 hours. Thyroid Function Tests: No results for input(s): TSH, T4TOTAL, FREET4, T3FREE, THYROIDAB in the last 72 hours. Anemia Panel: No results for input(s): VITAMINB12, FOLATE, FERRITIN, TIBC, IRON, RETICCTPCT in the last 72  hours. Sepsis Labs: Recent Labs  Lab 10/20/20 1911  LATICACIDVEN 0.9    Recent Results (from the past 240 hour(s))  Culture, blood (routine x 2)     Status: None (Preliminary result)   Collection Time: 10/20/20  5:29 PM   Specimen: BLOOD  Result Value Ref Range Status   Specimen Description BLOOD RIGHT ANTECUBITAL  Final   Special Requests   Final    BOTTLES DRAWN AEROBIC AND ANAEROBIC Blood Culture results may not be optimal due to an inadequate volume of blood received in culture bottles   Culture   Final    NO GROWTH 4 DAYS Performed at [facility], [place] [address] [place], [place] [address]    Report Status PENDING  Incomplete  Culture, blood (routine x 2)  Status: None (Preliminary result)   Collection Time: 10/20/20  5:29 PM   Specimen: BLOOD  Result Value Ref Range Status   Specimen Description BLOOD LEFT ANTECUBITAL  Final   Special Requests   Final    BOTTLES DRAWN AEROBIC AND ANAEROBIC Blood Culture results may not be optimal due to an inadequate volume of blood received in culture bottles   Culture   Final    NO GROWTH 4 DAYS Performed at [facility], [place] [address], [place], [place] [address]    Report Status PENDING  Incomplete  Urine culture     Status: Abnormal   Collection Time: 10/20/20  5:31 PM   Specimen: Urine, Random  Result Value Ref Range Status   Specimen Description URINE, RANDOM  Final   Special Requests   Final    NONE Performed at [facility], [place] [address] [place], [place] [address]    Culture MULTIPLE SPECIES PRESENT, SUGGEST RECOLLECTION (A)  Final   Report Status 10/22/2020 FINAL  Final  SARS Coronavirus 2 by RT PCR (hospital order, performed in [facility] hospital lab) Nasopharyngeal Nasopharyngeal Swab     Status: None   Collection Time: 10/20/20  6:08 PM   Specimen: Nasopharyngeal Swab  Result Value Ref Range Status   SARS Coronavirus 2 NEGATIVE NEGATIVE Final    Comment: (NOTE) SARS-CoV-2 target nucleic  acids are NOT DETECTED.  The SARS-CoV-2 RNA is generally detectable in upper and lower respiratory specimens during the acute phase of infection. The lowest concentration of SARS-CoV-2 viral copies this assay can detect is 250 copies / mL. A negative result does not preclude SARS-CoV-2 infection and should not be used as the sole basis for treatment or other patient management decisions.  A negative result may occur with improper specimen collection / handling, submission of specimen other than nasopharyngeal swab, presence of viral mutation(s) within the areas targeted by this assay, and inadequate number of viral copies (<250 copies / mL). A negative result must be combined with clinical observations, patient history, and epidemiological information.  Fact Sheet for Patients:   StrictlyIdeas.no  Fact Sheet for Healthcare Providers: BankingDealers.co.za  This test is not yet approved or  cleared by the [place] FDA and has been authorized for detection and/or diagnosis of SARS-CoV-2 by FDA under an Emergency Use Authorization (EUA).  This EUA will remain in effect (meaning this test can be used) for the duration of the COVID-19 declaration under Section 564(b)(1) of the Act, 21 U.S.C. section 360bbb-3(b)(1), unless the authorization is terminated or revoked sooner.  Performed at [facility], [place] [address], [place], [place] [address]       Radiology Studies: MR Brain W Wo Contrast  Result Date: 10/24/2020 CLINICAL DATA:  Metastatic melanoma EXAM: MRI HEAD WITHOUT AND WITH CONTRAST TECHNIQUE: Multiplanar, multiecho pulse sequences of the brain and surrounding structures were obtained without and with intravenous contrast. CONTRAST:  65mL GADAVIST GADOBUTROL 1 MMOL/ML IV SOLN COMPARISON:  None. FINDINGS: Brain: There are numerous lesions throughout the brain, many of which are new and/or have increased in size. There are  approximately 20 lesions. The largest lesions are in the left frontal operculum (3.4 x 3.0 cm, 11:15), right basal ganglia (3.5 x 2.5 cm, 11:13) and right occipital lobe (2.8 x 1.9 cm, 16:26). The lesions demonstrate intrinsic hyperintense T1-weighted signal. Beyond that, there is little demonstrated contrast enhancement. There is new mass effect on the right lateral ventricle without significant midline shift. There  is moderate vasogenic edema surrounding lesions in both frontal lobes, the right basal ganglia, both occipital lobes and both parietal lobes. Vascular: Normal flow voids. Skull and upper cervical spine: Normal marrow signal. Remote left anterior and posterior craniotomies. Sinuses/Orbits: Negative. Other: None. IMPRESSION: 1. Progression of metastatic melanoma with multiple new or increased size lesions. 2. Moderate vasogenic edema surrounding lesions in both frontal lobes, right basal ganglia, both occipital lobes and both parietal lobes. 3. New mass effect on the right lateral ventricle without significant midline shift. Electronically Signed   By: Ulyses Jarred M.D.   On: 10/24/2020 03:10    Scheduled Meds: . dexamethasone (DECADRON) injection  4 mg Intravenous Q12H  . levETIRAcetam  500 mg Oral BID  . pantoprazole  40 mg Oral Daily  . sodium chloride flush  3 mL Intravenous Q12H   Continuous Infusions:   LOS: 4 days   Time spent: 27 minutes   Darliss Cheney, MD [facility]  10/24/2020, 10:24 AM   To contact the attending provider between 7A-7P or the covering provider during after hours 7P-7A, please log into the web site [email or web address].

## 2020-10-25 ENCOUNTER — Encounter: Payer: Self-pay | Admitting: *Deleted

## 2020-10-25 ENCOUNTER — Inpatient Hospital Stay (HOSPITAL_COMMUNITY): Payer: 59

## 2020-10-25 DIAGNOSIS — C7931 Secondary malignant neoplasm of brain: Secondary | ICD-10-CM | POA: Diagnosis not present

## 2020-10-25 LAB — CULTURE, BLOOD (ROUTINE X 2)
Culture: NO GROWTH
Culture: NO GROWTH

## 2020-10-25 IMAGING — CT CT HEAD W/O CM
4 series · 17 of 47 positions shown, 19 images · non-contrast
Comparison: [DATE], [DATE]

CLINICAL DATA: Altered level of consciousness, metastatic melanoma

EXAM:
CT HEAD WITHOUT CONTRAST
TECHNIQUE: Contiguous axial images were obtained from the base of the skull
through the vertex without intravenous contrast.

[Series 3: head wo · axial · 0.42mm/px · z∈[-152,-32]mm · 7 of 33 slices shown, 9 images]
[im 5/33  brain]
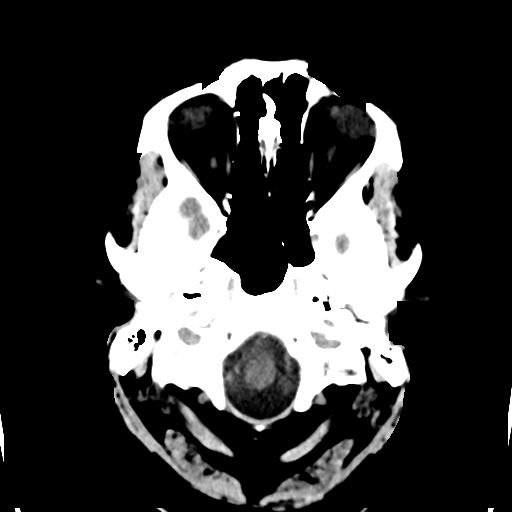
[im 5/33  bone]
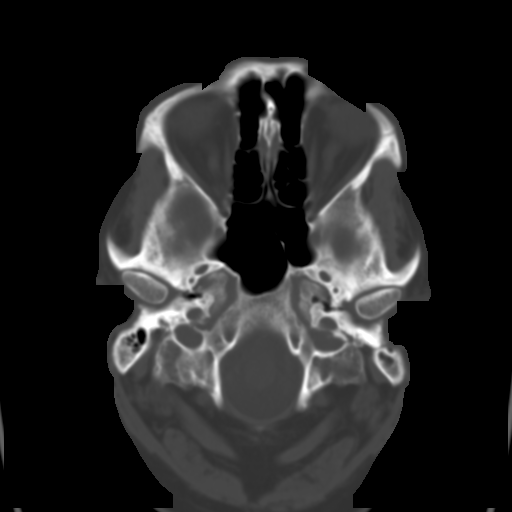
[im 9/33  brain]
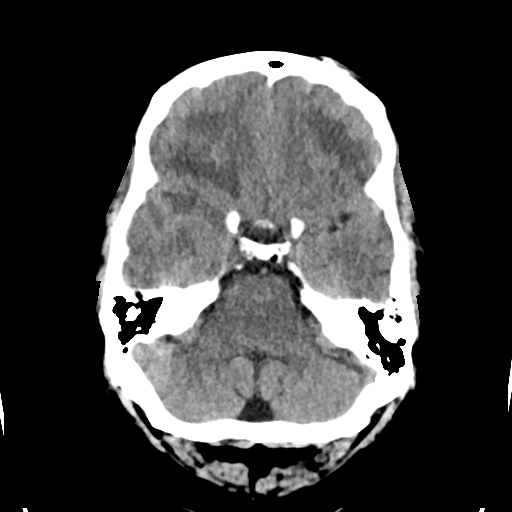
[im 13/33  brain]
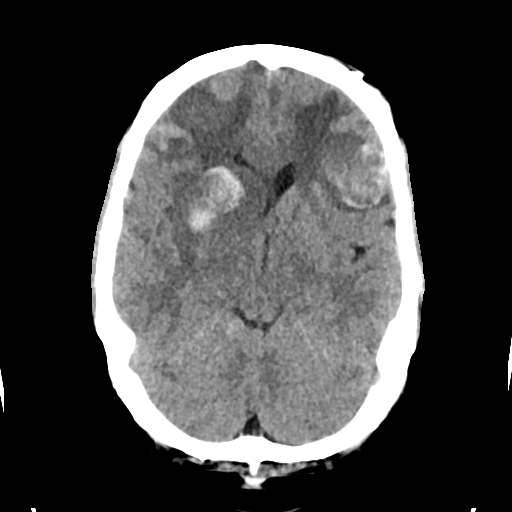
[im 17/33  brain]
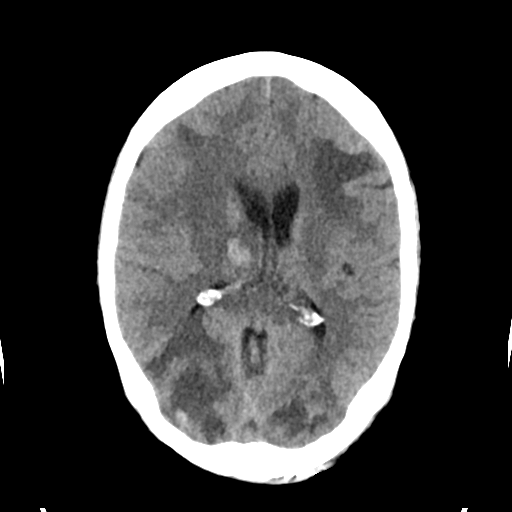
[im 21/33  brain]
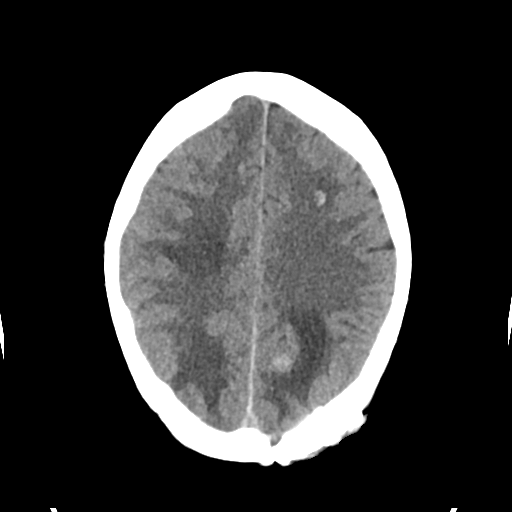
[im 21/33  bone]
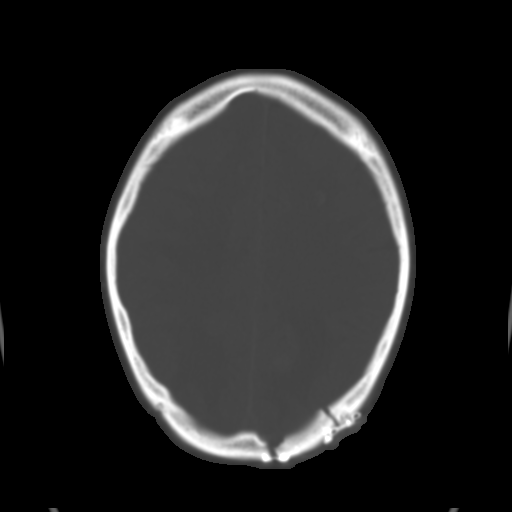
[im 25/33  brain]
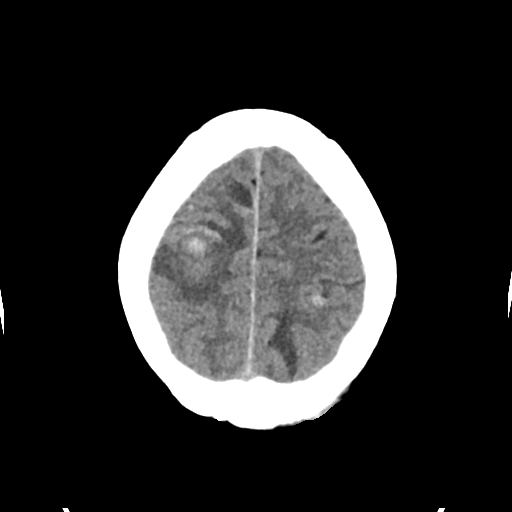
[im 29/33  brain]
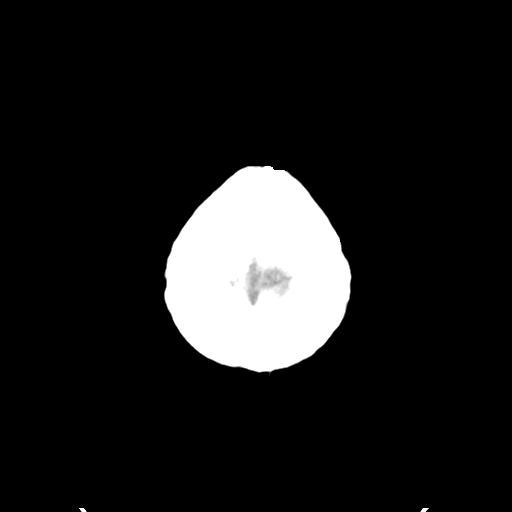

[Series 4: head bone · axial · 0.42mm/px · z∈[-156,-100]mm · 4 of 81 slices shown]
[im 9/81  bone]
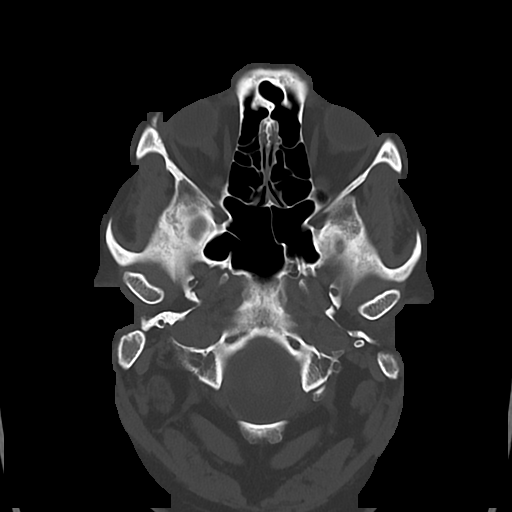
[im 17/81  bone]
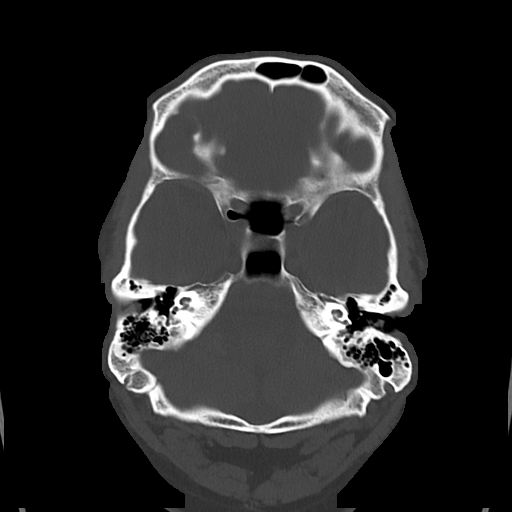
[im 25/81  bone]
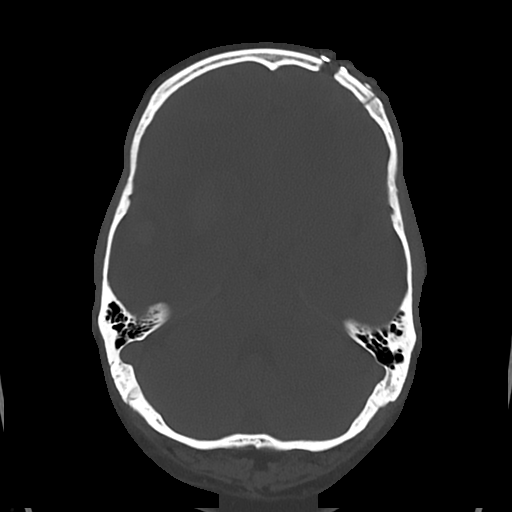
[im 37/81  bone]
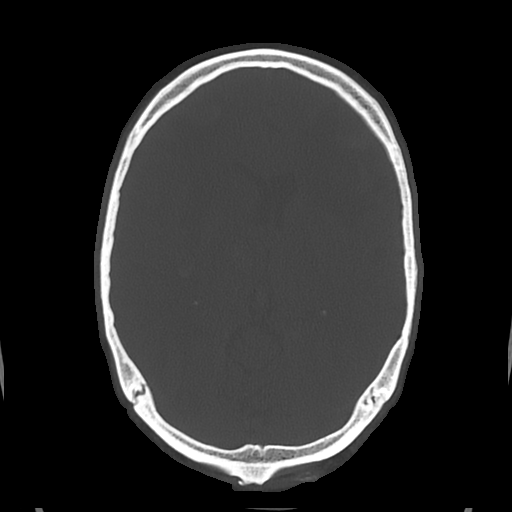

[Series 5: cor soft · coronal · 0.35mm/px · 3 of 65 slices shown]
[im 22/65  brain]
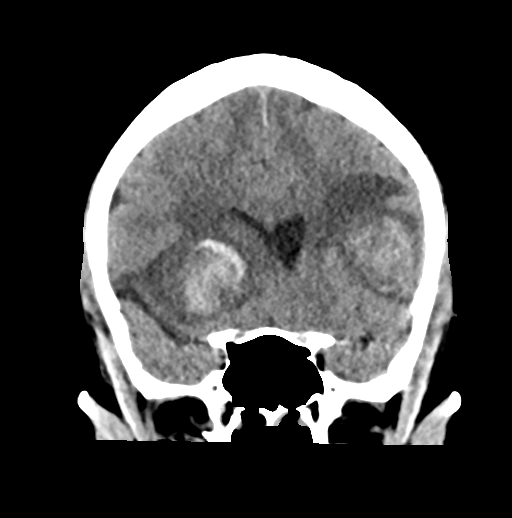
[im 29/65  brain]
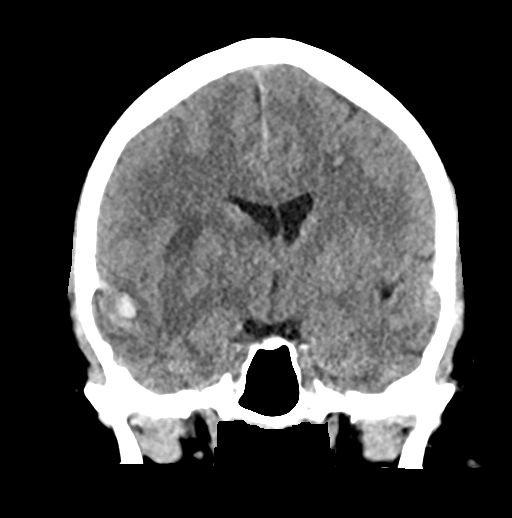
[im 36/65  brain]
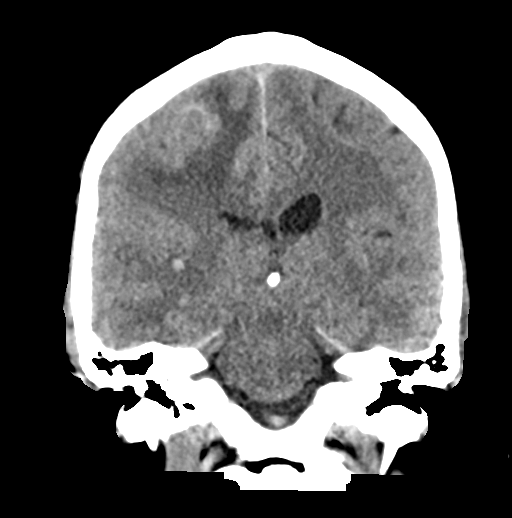

[Series 6: sag soft · sagittal · 0.35mm/px · 3 of 56 slices shown]
[im 19/56  brain]
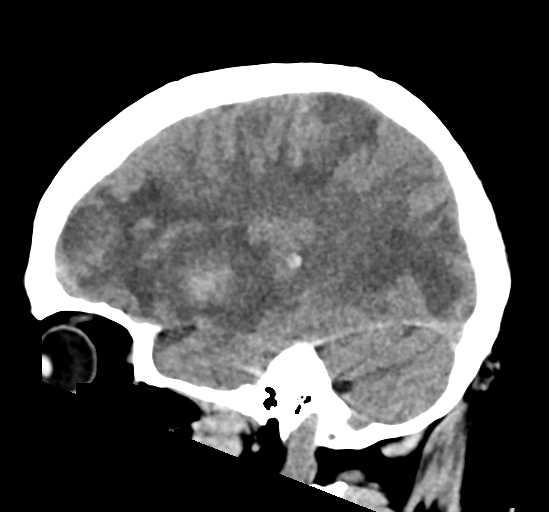
[im 28/56  brain]
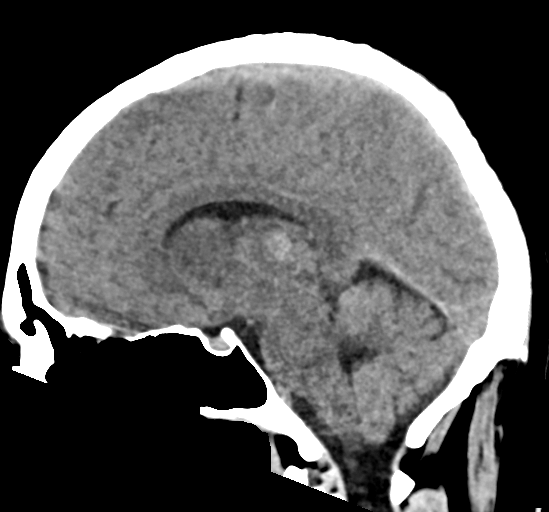
[im 37/56  brain]
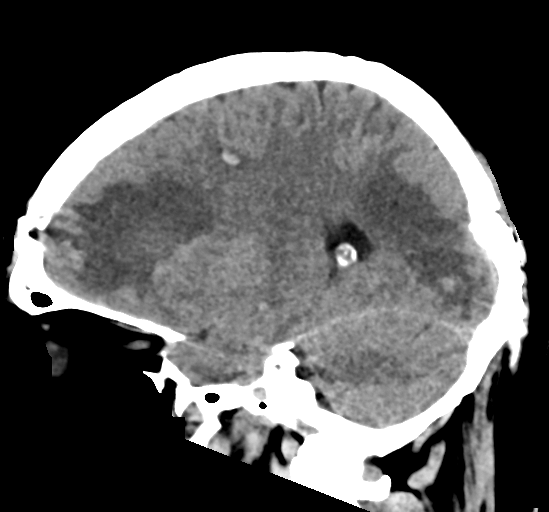

[17 of 47 positions shown; findings below may reference images not displayed]

FINDINGS: Brain: Unenhanced images of the brain again demonstrate numerous
hemorrhagic metastases compatible with known metastatic melanoma.
The size and number of lesions is unchanged. Diffuse areas of
vasogenic edema are seen throughout the bilateral cerebral
hemispheres, with effacement of the lateral ventricles right greater
than left. No significant midline shift. No acute infarct. No acute
extra-axial fluid collections.

Vascular: No hyperdense vessel or unexpected calcification.

Skull: Normal. Negative for fracture or focal lesion. Stable
postsurgical changes from prior left frontal and left occipital
craniotomy.

Sinuses/Orbits: No acute finding.

Other: None.
IMPRESSION: 1. Stable diffuse intracranial metastases compatible with metastatic
melanoma. Stable vasogenic edema and effacement of the lateral
ventricles.
2. No significant change since recent MRI and head CT.

## 2020-10-25 MED ORDER — OXYCODONE HCL 5 MG PO TABS
5.0000 mg | ORAL_TABLET | ORAL | Status: DC | PRN
  Administered 2020-10-26: 5 mg via ORAL
  Administered 2020-10-26: 10 mg via ORAL
  Filled 2020-10-25: qty 1
  Filled 2020-10-25: qty 2

## 2020-10-25 NOTE — Progress Notes (Signed)
I was informed that Sean [name] is unfortunately back inpatient due to worsening confusion and left sided weakness. I stopped by to see him today, he's in good spirits, his wounds are healing well x2, he was following commands x4 without preference but does have some decreased spontaneous movement in the LUE with a bit of neglect vs apraxia.   Given the question of treatment effect, I reviewed his MRI. The lesions treated with SRS appear stable or smaller except for the R thalamic, which has inc'd SWI artifact, which is consistent with interim increase in hemorrhage into that lesion and not necessarily treatment failure. Otherwise, there is obviously a significant increase in the number of large mets bilaterally and explains his confusion.   Glad he's doing well with steroids, let me know if I can be of further help in his care.

## 2020-10-25 NOTE — Progress Notes (Signed)
Physical Therapy Treatment Patient Details Name: Sean [name] MRN: [id number] DOB: 07/30/1966 Today's Date: 10/25/2020    History of Present Illness Pt is a 55 y.o. male who presents with AMS, R side neglect and weakness, aphasia, and urinary incontinence since 1/31. Pt has hx of multiple brain mets and craniotomy with tumor resection on the L 1/14. 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 S/p cycle 1 ipilimumab/nivolumab 1/31 with pt stopping Decadron due to reported interaction with his chemotherapy plan. CT showed significant interval increase in number and size of numerous infratentorial and supratentorial hemorrhagic metastases with significant vasogenic edema.  Resulting effacement of the right lateral ventricle noted. PMH of liver lesion and hyperlipidemia.    PT Comments    Pt making steady progress towards his goals, ambulating an increased distance of up to ~175 ft this date with a RW and min-modA. Pt is at risk for falls secondary to his muscular endurance deficits, impaired cognition, decreased safety awareness, L side neglect, and balance deficits. Pt requires simple multi-modal cues in quiet environments to follow directions as he is easily distracted. Focused session on improving independence with functional mobility, gait training, and sitting balance EOB to decrease his risk for falls and his burden of care. Will continue to follow acutely. Current recommendations remain appropriate.  Follow Up Recommendations  CIR     Equipment Recommendations  Rolling walker with 5" wheels;3in1 (PT);Hospital bed;Wheelchair (measurements PT);Wheelchair cushion (measurements PT) (may change with progress)    Recommendations for Other Services       Precautions / Restrictions Precautions Precautions: Fall Precaution Comments: L neglect, global visual deficits Restrictions Weight Bearing Restrictions: No    Mobility  Bed Mobility Overal bed mobility: Needs Assistance Bed  Mobility: Rolling;Sidelying to Sit Rolling: Min assist Sidelying to sit: Mod assist       General bed mobility comments: Verbal and tactile cues to roll to the L with cues to reach R hand for L bed rail to pull, success with minA. Cued pt to bring legs off EOB with some pt initiation but ultimately needing physical assistance to complete. Cues to bring L elbow under trunk to push up, modA.  Transfers Overall transfer level: Needs assistance   Transfers: Sit to/from Stand Sit to Stand: Min assist         General transfer comment: Repeated verbal and hand-over-hand tactile cues for hand placement on bed to push up to stand, but pt repeatedly placed hands on RW. During 2st attempt pt not activating and provided maxA to unsuccessfully try to stand. Pt impulsively came to stand 2x afterwards with minA though.  Ambulation/Gait Ambulation/Gait assistance: Mod assist;Min assist Gait Distance (Feet): 175 Feet Assistive device: Rolling walker (2 wheeled) Gait Pattern/deviations: Step-through pattern;Decreased step length - left;Decreased stride length;Decreased dorsiflexion - left;Staggering left Gait velocity: reduced Gait velocity interpretation: <1.8 ft/sec, indicate of risk for recurrent falls General Gait Details: Poor attention to L side with pt bumping obstacles on L and drifting to L, especially as pt fatigued final ~50 ft. Decreased L step length and height, providing verbal and tactile cues to improve, fair carryover. Min-modA to manage RW and stabilize pt, x2 LOB needing modA to recover.   Stairs             Wheelchair Mobility    Modified Rankin (Stroke Patients Only) Modified Rankin (Stroke Patients Only) Pre-Morbid Rankin Score: No symptoms Modified Rankin: Moderately severe disability     Balance Overall balance assessment: Needs assistance Sitting-balance support: No  upper extremity supported;Feet supported Sitting balance-Leahy Scale: Good Sitting balance -  Comments: No LOB cuing pt to reach off BOS and across midline and donn socks, supervision for safety.   Standing balance support: Bilateral upper extremity supported;During functional activity Standing balance-Leahy Scale: Poor Standing balance comment: Bil UE support and external assist for safety.                            Cognition Arousal/Alertness: Awake/alert Behavior During Therapy: Flat affect;Impulsive Overall Cognitive Status: Impaired/Different from baseline Area of Impairment: Attention;Memory;Following commands;Safety/judgement;Awareness;Problem solving                   Current Attention Level: Selective Memory: Decreased short-term memory;Decreased recall of precautions Following Commands: Follows one step commands with increased time;Follows multi-step commands inconsistently;Follows one step commands inconsistently Safety/Judgement: Decreased awareness of safety;Decreased awareness of deficits Awareness: Intellectual Problem Solving: Decreased initiation;Slow processing;Difficulty sequencing;Requires verbal cues;Requires tactile cues General Comments: Pt follows cues inconsistently and needs multi-modal cues and rewording or visual cues to get pt to appropriately follow directions. Pt needing physical assistance to avoid obstacles, poor safety and deficits awareness. Easily distracted, needing quiet environment to follow cues.      Exercises      General Comments        Pertinent Vitals/Pain Pain Assessment: No/denies pain Pain Intervention(s): Monitored during session    Home Living                      Prior Function            PT Goals (current goals can now be found in the care plan section) Acute Rehab PT Goals Patient Stated Goal: to improve PT Goal Formulation: With patient/family Time For Goal Achievement: 11/04/20 Potential to Achieve Goals: Good Progress towards PT goals: Progressing toward goals     Frequency    Min 4X/week      PT Plan Current plan remains appropriate    Co-evaluation              AM-PAC PT "6 Clicks" Mobility   Outcome Measure  Help needed turning from your back to your side while in a flat bed without using bedrails?: A Lot Help needed moving from lying on your back to sitting on the side of a flat bed without using bedrails?: A Lot Help needed moving to and from a bed to a chair (including a wheelchair)?: A Lot Help needed standing up from a chair using your arms (e.g., wheelchair or bedside chair)?: A Little Help needed to walk in hospital room?: A Lot Help needed climbing 3-5 steps with a railing? : A Lot 6 Click Score: 13    End of Session Equipment Utilized During Treatment: Gait belt Activity Tolerance: Patient tolerated treatment well Patient left: with call bell/phone within reach;with family/visitor present;in chair;with chair alarm set Nurse Communication: Mobility status PT Visit Diagnosis: Unsteadiness on feet (R26.81);Other abnormalities of gait and mobility (R26.89);Muscle weakness (generalized) (M62.81);Difficulty in walking, not elsewhere classified (R26.2);Other symptoms and signs involving the nervous system (R29.898);Hemiplegia and hemiparesis Hemiplegia - Right/Left: Left Hemiplegia - dominant/non-dominant: Dominant 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

## 2020-10-25 NOTE — Progress Notes (Signed)
[employer] Case Manager, Pete Glatter, RN requesting recent H & P, treatment plan records. Faxed last office note, admission H & P, MD hospital note and recent MRI report to [phone] as requested.

## 2020-10-25 NOTE — Progress Notes (Signed)
PROGRESS NOTE        PATIENT DETAILS Name: Sean [name] Age: 55 y.o. Sex: male Date of Birth: 12-07-1965 Admit Date: 10/20/2020 Admitting Physician Lenore Cordia, MD SUP:[id number], Thayer Jew, MD  Brief Narrative: Patient is a 55 y.o. male with history of malignant melanoma with brain mets-s/p SRS to 10 brain lesions on 09/27/2020-s/p resection of left occipital/left frontal lesions on 09/29/2020-and 1 cycle of ipilimumab/nivolumab on 10/16/2020 presented with left-sided weakness, new onset seizure-further imaging studies suggestive of progression of untreated brain metastases.  Significant events: 2/4>> admit for left-sided weakness/seizure  Significant studies: 2/4>> CT head: Interval increase in number and size of numerous infarct tentorial/supratentorial hemorrhagic metastases with edema. 2/8> MRI brain: Progression of metastatic melanoma with multiple new/increased size lesions, moderate vasogenic edema, new mass-effect on the right lateral ventricle  Antimicrobial therapy: None  Microbiology data: 2/4>> blood culture: No growth 2/4>> urine culture: Multiple species  Procedures : None  Consults: Neurosurgery, oncology, palliative care  DVT Prophylaxis : SCDs Start: 10/20/20 2105   Subjective: Awake/alert-no major issues overnight.  Assessment/Plan: Left-sided weakness/seizure/encephalopathy: Stable-awake/alert this morning-etiology felt to be due to progression of brain metastases.  On steroids and Keppra.  Neuro oncology/neurosurgery/oncology are following.  Plans are to discharge to CIR when bed available.  Deconditioning/debility: Due to acute illness-seizures-underlying metastatic melanoma-evaluated by rehab services-plans are for CIR on discharge.  Awaiting insurance authorization  Diet: Diet Order            Diet regular Room service appropriate? Yes; Fluid consistency: Thin  Diet effective now                  Code Status: Full  code  Family Communication: None at bedside  Disposition Plan: Status is: Inpatient  Remains inpatient appropriate because:Inpatient level of care appropriate due to severity of illness   Dispo: The patient is from: Home              Anticipated d/c is to: CIR              Anticipated d/c date is: 1 day              Patient currently is medically stable to d/c.   Difficult to place patient Yes   Barriers to Discharge: Awaiting CIR bed  Antimicrobial agents: Anti-infectives (From admission, onward)   None       Time spent: 25- minutes-Greater than 50% of this time was spent in counseling, explanation of diagnosis, planning of further management, and coordination of care.  MEDICATIONS: Scheduled Meds: . dexamethasone (DECADRON) injection  4 mg Intravenous Q12H  . levETIRAcetam  500 mg Oral BID  . pantoprazole  40 mg Oral Daily  . sodium chloride flush  3 mL Intravenous Q12H   Continuous Infusions: PRN Meds:.acetaminophen **OR** acetaminophen, melatonin, ondansetron **OR** ondansetron (ZOFRAN) IV   PHYSICAL EXAM: Vital signs: Vitals:   10/24/20 2014 10/24/20 2350 10/25/20 0408 10/25/20 0759  BP: 126/79 126/75 128/83 112/80  Pulse: 81 70 71 70  Resp: 18 18 17 18   Temp: 97.9 F (36.6 C) (!) 97.4 F (36.3 C) 97.8 F (36.6 C) 98.1 F (36.7 C)  TempSrc: Oral Oral Oral Oral  SpO2: 99% 96% 97% 97%  Weight:      Height:       Filed Weights   10/20/20 1701  Weight: 90 kg  Body mass index is 27.67 kg/m.   Gen Exam:Alert awake-not in any distress HEENT:atraumatic, normocephalic Chest: B/L clear to auscultation anteriorly CVS:S1S2 regular Abdomen:soft non tender, non distended Extremities:no edema Neurology: Non focal Skin: no rash  I have personally reviewed following labs and imaging studies  LABORATORY DATA: CBC: Recent Labs  Lab 10/20/20 1729 10/21/20 0610 10/22/20 0219  WBC 6.2 6.2 7.7  NEUTROABS 4.7  --  6.3  HGB 15.0 14.0 13.8  HCT 41.6  40.1 38.2*  MCV 89.3 88.5 87.8  PLT 200 185 191    Basic Metabolic Panel: Recent Labs  Lab 10/20/20 1729 10/21/20 0610 10/22/20 0219 10/24/20 0847  NA 140 139 139 137  K 3.9 4.0 4.0 3.4*  CL 107 106 105 102  CO2 20* 22 23 23   GLUCOSE 104* 144* 167* 110*  BUN 18 17 19  21*  CREATININE 0.74 0.68 0.77 0.69  CALCIUM 9.0 8.9 9.0 9.2    GFR: Estimated Creatinine Clearance: 109.8 mL/min (by C-G formula based on SCr of 0.69 mg/dL).  Liver Function Tests: Recent Labs  Lab 10/20/20 1729  AST 27  ALT 48*  ALKPHOS 110  BILITOT 1.5*  PROT 6.6  ALBUMIN 3.3*   No results for input(s): LIPASE, AMYLASE in the last 168 hours. No results for input(s): AMMONIA in the last 168 hours.  Coagulation Profile: No results for input(s): INR, PROTIME in the last 168 hours.  Cardiac Enzymes: No results for input(s): CKTOTAL, CKMB, CKMBINDEX, TROPONINI in the last 168 hours.  BNP (last 3 results) No results for input(s): PROBNP in the last 8760 hours.  Lipid Profile: No results for input(s): CHOL, HDL, LDLCALC, TRIG, CHOLHDL, LDLDIRECT in the last 72 hours.  Thyroid Function Tests: No results for input(s): TSH, T4TOTAL, FREET4, T3FREE, THYROIDAB in the last 72 hours.  Anemia Panel: No results for input(s): VITAMINB12, FOLATE, FERRITIN, TIBC, IRON, RETICCTPCT in the last 72 hours.  Urine analysis:    Component Value Date/Time   COLORURINE AMBER (A) 10/20/2020 1731   APPEARANCEUR CLEAR 10/20/2020 1731   LABSPEC 1.028 10/20/2020 1731   PHURINE 6.0 10/20/2020 1731   GLUCOSEU NEGATIVE 10/20/2020 1731   HGBUR NEGATIVE 10/20/2020 1731   BILIRUBINUR NEGATIVE 10/20/2020 1731   KETONESUR 5 (A) 10/20/2020 1731   PROTEINUR NEGATIVE 10/20/2020 1731   NITRITE NEGATIVE 10/20/2020 1731   LEUKOCYTESUR NEGATIVE 10/20/2020 1731    Sepsis Labs: Lactic Acid, Venous    Component Value Date/Time   LATICACIDVEN 0.9 10/20/2020 1911    MICROBIOLOGY: Recent Results (from the past 240 hour(s))   Culture, blood (routine x 2)     Status: None   Collection Time: 10/20/20  5:29 PM   Specimen: BLOOD  Result Value Ref Range Status   Specimen Description BLOOD RIGHT ANTECUBITAL  Final   Special Requests   Final    BOTTLES DRAWN AEROBIC AND ANAEROBIC Blood Culture results may not be optimal due to an inadequate volume of blood received in culture bottles   Culture   Final    NO GROWTH 5 DAYS Performed at [facility], [place] [address], [place], [place] [address]    Report Status 10/25/2020 FINAL  Final  Culture, blood (routine x 2)     Status: None   Collection Time: 10/20/20  5:29 PM   Specimen: BLOOD  Result Value Ref Range Status   Specimen Description BLOOD LEFT ANTECUBITAL  Final   Special Requests   Final    BOTTLES DRAWN AEROBIC AND ANAEROBIC Blood Culture  results may not be optimal due to an inadequate volume of blood received in culture bottles   Culture   Final    NO GROWTH 5 DAYS Performed at [facility], [place] [address], [place], [place] [address]    Report Status 10/25/2020 FINAL  Final  Urine culture     Status: Abnormal   Collection Time: 10/20/20  5:31 PM   Specimen: Urine, Random  Result Value Ref Range Status   Specimen Description URINE, RANDOM  Final   Special Requests   Final    NONE Performed at [facility], [place] [address] [place], [place] [address]    Culture MULTIPLE SPECIES PRESENT, SUGGEST RECOLLECTION (A)  Final   Report Status 10/22/2020 FINAL  Final  SARS Coronavirus 2 by RT PCR (hospital order, performed in [facility] hospital lab) Nasopharyngeal Nasopharyngeal Swab     Status: None   Collection Time: 10/20/20  6:08 PM   Specimen: Nasopharyngeal Swab  Result Value Ref Range Status   SARS Coronavirus 2 NEGATIVE NEGATIVE Final    Comment: (NOTE) SARS-CoV-2 target nucleic acids are NOT DETECTED.  The SARS-CoV-2 RNA is generally detectable in upper and lower respiratory specimens during the acute phase of infection. The  lowest concentration of SARS-CoV-2 viral copies this assay can detect is 250 copies / mL. A negative result does not preclude SARS-CoV-2 infection and should not be used as the sole basis for treatment or other patient management decisions.  A negative result may occur with improper specimen collection / handling, submission of specimen other than nasopharyngeal swab, presence of viral mutation(s) within the areas targeted by this assay, and inadequate number of viral copies (<250 copies / mL). A negative result must be combined with clinical observations, patient history, and epidemiological information.  Fact Sheet for Patients:   StrictlyIdeas.no  Fact Sheet for Healthcare Providers: BankingDealers.co.za  This test is not yet approved or  cleared by the [place] FDA and has been authorized for detection and/or diagnosis of SARS-CoV-2 by FDA under an Emergency Use Authorization (EUA).  This EUA will remain in effect (meaning this test can be used) for the duration of the COVID-19 declaration under Section 564(b)(1) of the Act, 21 U.S.C. section 360bbb-3(b)(1), unless the authorization is terminated or revoked sooner.  Performed at [facility], [place] [address], [place], [place] [address]     RADIOLOGY STUDIES/RESULTS: MR Brain W Wo Contrast  Result Date: 10/24/2020 CLINICAL DATA:  Metastatic melanoma EXAM: MRI HEAD WITHOUT AND WITH CONTRAST TECHNIQUE: Multiplanar, multiecho pulse sequences of the brain and surrounding structures were obtained without and with intravenous contrast. CONTRAST:  78mL GADAVIST GADOBUTROL 1 MMOL/ML IV SOLN COMPARISON:  None. FINDINGS: Brain: There are numerous lesions throughout the brain, many of which are new and/or have increased in size. There are approximately 20 lesions. The largest lesions are in the left frontal operculum (3.4 x 3.0 cm, 11:15), right basal ganglia (3.5 x 2.5 cm, 11:13) and  right occipital lobe (2.8 x 1.9 cm, 16:26). The lesions demonstrate intrinsic hyperintense T1-weighted signal. Beyond that, there is little demonstrated contrast enhancement. There is new mass effect on the right lateral ventricle without significant midline shift. There is moderate vasogenic edema surrounding lesions in both frontal lobes, the right basal ganglia, both occipital lobes and both parietal lobes. Vascular: Normal flow voids. Skull and upper cervical spine: Normal marrow signal. Remote left anterior and posterior craniotomies. Sinuses/Orbits: Negative. Other: None. IMPRESSION: 1. Progression of metastatic melanoma  with multiple new or increased size lesions. 2. Moderate vasogenic edema surrounding lesions in both frontal lobes, right basal ganglia, both occipital lobes and both parietal lobes. 3. New mass effect on the right lateral ventricle without significant midline shift. Electronically Signed   By: Ulyses Jarred M.D.   On: 10/24/2020 03:10     LOS: 5 days   Oren Binet, MD  [facility]    To contact the attending provider between 7A-7P or the covering provider during after hours 7P-7A, please log into the web site [email or web address] and access using [place] password for that web site. If you do not have the password, please call the hospital operator.  10/25/2020, 10:44 AM

## 2020-10-25 NOTE — Progress Notes (Signed)
HEMATOLOGY-ONCOLOGY PROGRESS NOTE  SUBJECTIVE: He is lethargic this morning.  His wife is not present.  No complaint.  Oncology History  Melanoma of skin ([place])  10/05/2020 Initial Diagnosis   Melanoma of skin ([place])   10/05/2020 Cancer Staging   Staging form: Melanoma of the Skin, AJCC 8th Edition - Pathologic: Stage IV (pTX, pNX, pM1) - Signed by [name], MD on 10/05/2020   10/16/2020 -  Chemotherapy    Patient is on Treatment Plan: MELANOMA NIVOLUMAB + IPILIMUMAB (1/3) Q21D / NIVOLUMAB Q14D       PHYSICAL EXAMINATION:  Vitals:   10/25/20 0408 10/25/20 0759  BP: 128/83 112/80  Pulse: 71 70  Resp: 17 18  Temp: 97.8 F (36.6 C) 98.1 F (36.7 C)  SpO2: 97% 97%   Filed Weights   10/20/20 1701  Weight: 198 lb 6.6 oz (90 kg)    Intake/Output from previous day: 02/08 0701 - 02/09 0700 In: -  Out: 850 [Urine:850]  GENERAL: Lethargic, arousable SKIN: skin color, texture, turgor are normal, no rashes or significant lesions OROPHARYNX: No thrush ABDOMEN:abdomen soft, non-tender and normal bowel sounds NEURO: Arousable, oriented to place and year, follows simple commands, moves all extremities to command   LABORATORY DATA:  I have reviewed the data as listed CMP Latest Ref Rng & Units 10/24/2020 10/22/2020 10/21/2020  Glucose 70 - 99 mg/dL 110(H) 167(H) 144(H)  BUN 6 - 20 mg/dL 21(H) 19 17  Creatinine 0.61 - 1.24 mg/dL 0.69 0.77 0.68  Sodium 135 - 145 mmol/L 137 139 139  Potassium 3.5 - 5.1 mmol/L 3.4(L) 4.0 4.0  Chloride 98 - 111 mmol/L 102 105 106  CO2 22 - 32 mmol/L 23 23 22   Calcium 8.9 - 10.3 mg/dL 9.2 9.0 8.9  Total Protein 6.5 - 8.1 g/dL - - -  Total Bilirubin 0.3 - 1.2 mg/dL - - -  Alkaline Phos 38 - 126 U/L - - -  AST 15 - 41 U/L - - -  ALT 0 - 44 U/L - - -    Lab Results  Component Value Date   WBC 7.7 10/22/2020   HGB 13.8 10/22/2020   HCT 38.2 (L) 10/22/2020   MCV 87.8 10/22/2020   PLT 207 10/22/2020   NEUTROABS 6.3 10/22/2020    DG Chest 2  View  Result Date: 10/20/2020 CLINICAL DATA:  Altered mental status EXAM: CHEST - 2 VIEW COMPARISON:  PET-CT September 25, 2020 FINDINGS: The heart size and mediastinal contours are within normal limits. Streaky left basilar opacity not significantly changed from prior favor atelectasis. Known right upper lobe pulmonary nodule not visualized on today's exam. No pleural effusion. No pneumothorax. The visualized skeletal structures are unremarkable. IMPRESSION: Streaky left basilar opacity, favor atelectasis though developing infection not excluded. Electronically Signed   By: Dahlia Bailiff MD   On: 10/20/2020 17:55   CT Head Wo Contrast  Result Date: 10/20/2020 CLINICAL DATA:  Mental status change, unknown cause. EXAM: CT HEAD WITHOUT CONTRAST TECHNIQUE: Contiguous axial images were obtained from the base of the skull through the vertex without intravenous contrast. COMPARISON:  MRI head 09/30/20 FINDINGS: Brain: Interval increase in number and size of numerous supratentorial hemorrhagic metastases. The largest lesion on the left measures up to 5 point 1 cm in the left frontal lobe. The largest lesion on the right measures up to 3.3 cm in the right frontal lobe. Of note, there is a hemorrhagic lesion in the right thalamus. There is exuberant vasogenic edema associated with  these lesions. No substantial midline shift. There is significant effacement of the right lateral ventricle. No hydrocephalus. Infratentorially, there is an 8 mm hemorrhagic metastasis within the superior right cerebellum. Vascular: No hyperdense vessel identified. Skull: No acute fracture. Prior left frontal and left parietal craniotomy. Sinuses/Orbits: No acute findings. Other: No mastoid effusions. IMPRESSION: Significant interval increase in number and size of numerous infratentorial and supratentorial hemorrhagic metastases with exuberant edema, as detailed above. Resulting effacement of the right lateral ventricle. An MRI with contrast  could provide more complete evaluation if clinically indicated. Findings discussed with Dr. Gilford Raid Via telephone at 6:32 PM. Electronically Signed   By: Margaretha Sheffield MD   On: 10/20/2020 18:35   MR Brain W Wo Contrast  Result Date: 10/24/2020 CLINICAL DATA:  Metastatic melanoma EXAM: MRI HEAD WITHOUT AND WITH CONTRAST TECHNIQUE: Multiplanar, multiecho pulse sequences of the brain and surrounding structures were obtained without and with intravenous contrast. CONTRAST:  74mL GADAVIST GADOBUTROL 1 MMOL/ML IV SOLN COMPARISON:  None. FINDINGS: Brain: There are numerous lesions throughout the brain, many of which are new and/or have increased in size. There are approximately 20 lesions. The largest lesions are in the left frontal operculum (3.4 x 3.0 cm, 11:15), right basal ganglia (3.5 x 2.5 cm, 11:13) and right occipital lobe (2.8 x 1.9 cm, 16:26). The lesions demonstrate intrinsic hyperintense T1-weighted signal. Beyond that, there is little demonstrated contrast enhancement. There is new mass effect on the right lateral ventricle without significant midline shift. There is moderate vasogenic edema surrounding lesions in both frontal lobes, the right basal ganglia, both occipital lobes and both parietal lobes. Vascular: Normal flow voids. Skull and upper cervical spine: Normal marrow signal. Remote left anterior and posterior craniotomies. Sinuses/Orbits: Negative. Other: None. IMPRESSION: 1. Progression of metastatic melanoma with multiple new or increased size lesions. 2. Moderate vasogenic edema surrounding lesions in both frontal lobes, right basal ganglia, both occipital lobes and both parietal lobes. 3. New mass effect on the right lateral ventricle without significant midline shift. Electronically Signed   By: Ulyses Jarred M.D.   On: 10/24/2020 03:10   MR BRAIN W WO CONTRAST  Result Date: 09/30/2020 CLINICAL DATA:  Status post craniotomy for tumor section. EXAM: MRI HEAD WITHOUT AND WITH CONTRAST  TECHNIQUE: Multiplanar, multiecho pulse sequences of the brain and surrounding structures were obtained without and with intravenous contrast. CONTRAST:  70mL GADAVIST GADOBUTROL 1 MMOL/ML IV SOLN COMPARISON:  Brain MRI 09/18/2020 FINDINGS: Brain: There are numerous diffusion restricting lesions throughout both hemispheres, which have associated hemorrhage and areas of contrast enhancement. New or increased sized lesions are as follows: 1. Right occipital lobe, 7 mm, previously 5 mm, series 18, image 24 2. Right basal ganglia, 3 mm, series 18, image 31 3. Right thalamus, 8 mm, image 34, mild surrounding edema, intrinsic hyperintense T1-weighted signal 4. Left frontal operculum, enhancing component 5 mm surrounding area 17 mm, possibly previously present as a punctate lesion, image 35 5. Superior left occipital lobe, 14 mm, previously 10 mm, image 37, mild surrounding edema 6. Anterior right frontal lobe 3 mm, image 37, intrinsic hyperintense T1-weighted signal 7. Left frontal lobe, 6 mm, previously 4 mm, image 44, intrinsic hyperintense T1-weighted signal Unchanged or smaller lesions: 1. Superior right cerebellum, 3 mm, series 18, image 23 2. Dominant left occipital lesion has been resected. There is minimal residual contrast enhancement adjacent to the occipital horn of the left lateral ventricle, image 32, mild edema 3. Left frontal lobe lesion status post resection with  no residual nodular contrast enhancement, image 37, mild edema 4. Superomedial right parietal lobe, 4 mm, image 42 5. Left parietal lobe, 3 mm, image 45 6. Punctate left parietal lobe, image 43 7. Paramedian superior right frontal lobe, 14 mm, image 49, mild edema with intrinsic hyperintense T1-weighted signal Vascular: Normal flow voids. Skull and upper cervical spine: Left frontal and parietal craniotomies. Sinuses/Orbits: Negative. Other: None. IMPRESSION: 1. Status post resection of left frontal and occipital lesions with minimal residual  contrast enhancement near the occipital horn of the left lateral ventricle. 2. 7 lesions that are new or larger. 3. Remainder of lesions are unchanged or smaller. Electronically Signed   By: Ulyses Jarred M.D.   On: 09/30/2020 01:34   NM PET Image Initial (PI) Skull Base To Thigh  Result Date: 09/25/2020 CLINICAL DATA:  Initial treatment strategy for metastatic disease of unknown primary. EXAM: NUCLEAR MEDICINE PET SKULL BASE TO THIGH TECHNIQUE: 11.4 mCi F-18 FDG was injected intravenously. Full-ring PET imaging was performed from the skull base to thigh after the radiotracer. CT data was obtained and used for attenuation correction and anatomic localization. Fasting blood glucose: 83 mg/dl COMPARISON:  Chest abdomen pelvis CT 09/14/2020. FINDINGS: Mediastinal blood pool activity: SUV max 2.4 Liver activity: SUV max NA NECK: No hypermetabolic lymph nodes in the neck. Incidental CT findings: none CHEST: Focal hypermetabolism in the right hilum identified with SUV max = 4.8. No definite lymphadenopathy evident on noncontrast CT imaging today. No other suspicious mediastinal or hilar uptake. 10 mm anterior right upper lobe nodule on 87/3 shows low level hypermetabolism with SUV max = 2.0. Scattered additional tiny pulmonary nodules are too small to characterize by PET imaging. Incidental CT findings: Minimal dependent atelectasis bilaterally. ABDOMEN/PELVIS: Multiple hypermetabolic lesions are seen in the liver,. These appear to be capsular/subcapsular and no abnormality is evident on noncontrast CT imaging. Hypermetabolic lesion adjacent to the falciform ligament in segment III demonstrates SUV max = 6.5. Capsular/subcapsular lesion medial liver at the porta hepatis demonstrates SUV max = 10.4. A third focus of hypermetabolism is seen in inferior tip of the right liver, again with no CT correlate but SUV max = 7.9. No hypermetabolic lymphadenopathy in the abdomen or pelvis. No hypermetabolic abnormality in the  pancreas or spleen. No adrenal hypermetabolism. Focal hypermetabolism identified in the right iliopsoas muscles of the pelvis with SUV max = 5.7. No CT correlate. Focal hypermetabolism identified in the abductor muscles of the medial left thigh ( SUV max = 11.6) without underlying lesion by CT imaging. There is a focus of hypermetabolism musculature of the medial left thigh with SUV max = 5.7. Again, no abnormality on CT imaging at this location. Incidental CT findings: Atherosclerotic calcification noted in the abdominal aorta. SKELETON: Mottled uptake is identified in bony anatomy without definite evidence for bony metastases Incidental CT findings: none IMPRESSION: 1. Focal areas of hypermetabolism identified in the right hilum, multiple areas of the liver adjacent to the capsule, and in musculature of the pelvis and left thigh. No CT correlate in any of these locations on today's noncontrast CT imaging. Nevertheless, imaging features concerning for metastatic disease. Repeat imaging with IV contrast could be used to further evaluate as clinically warranted. 2. 10 mm anterior right upper lobe pulmonary nodule shows low level hypermetabolism. Additional tiny pulmonary nodules are too small to characterize on PET imaging. 3.  Aortic Atherosclerois (ICD10-170.0) Electronically Signed   By: Misty Stanley M.D.   On: 09/25/2020 14:49    ASSESSMENT AND  PLAN: 1.Multiple brain metastases, unknown primary tumor site  CT brain 09/14/2020-multiple hemorrhagic metastases with surrounding edema in the bilateral cerebral hemispheres  CTs chest, abdomen, pelvis 09/14/2020-1.4 cm right upper lobe subpleural nodule, 0.5 cm right lower lobe nodule, multiple hypodensities in the liver suspicious for metastases, 1.5 cm right upper pole hypodense kidney lesion-indeterminate  MRI abdomen 09/15/2020-multifocal T1 hyperintense liver lesions, no kidney mass  Ultrasound-guided biopsy of a segment 4B liver lesion  09/18/2020-benign liver parenchyma with steatosis, no evidence of malignancy  PET 09/25/2020-focal area of hypermetabolism in the right hilum, multiple areas of the liver, pelvic musculature, and left thigh-no CT correlate with any of these areas on the noncontrast CT, 10 mm anterior right upper lobe nodule with low-level hypermetabolism, additional tiny pulmonary nodules too small to characterize by PET  SRS to 10 brain lesions 09/27/2020  Resection of left occipital and left frontal lesions on 09/29/2020-pathology consistent with malignant melanoma  Cycle 1 ipilimumab/nivolumab 10/16/2020  CT of the brain without contrast 10/26/2020 showed significant interval increase in the number and size of numerous metastases  Brain MRI 10/24/2020-multiple new and increased brain metastases, moderate edema surrounding multiple lesions, new mass-effect at the right lateral ventricle  2.Left-sided weakness, new onset seizure secondary to #1, weakness has resolved 3.Hyperlipidemia 4.Elevated liver enzymes and bilirubin  Liver enzymes more elevated on repeat labs 09/27/2020, improved1/13/2022 5.Headache and nausea/vomiting secondary to #1, resolved 6.  Hospital admission 10/20/2020-encephalopathy  Mr. Constantin appears stable.  He continues to have confusion.  Extensive review of the brain MRI by neuroradiology, neuro-oncology, and radiation oncology is consistent with progression of untreated brain metastases.  Treated lesions have expected posttreatment changes.  The significant progression over several weeks is unusual.  It is possible the MRI findings are in part related to hemorrhage or tumor flare from immunotherapy.  Hopefully the radiologic findings and his symptoms will improve over the next few weeks.  We will plan to continue immunotherapy.  Recommendations: 1. Continue dexamethasone 4 mg every 12 hours, slow taper to begin within the next few days 2.  Physical therapy/rehabilitation  admission 3.  Medical oncology will continue following him on the rehab unit.     LOS: 5 days   Betsy Coder, MD 10/25/20

## 2020-10-25 NOTE — Evaluation (Signed)
Clinical/Bedside Swallow Evaluation Patient Details  Name: Sean [name] MRN: [id number] Date of Birth: 23-Feb-1966  Today's Date: 10/25/2020 Time: SLP Start Time (ACUTE ONLY): 1046 SLP Stop Time (ACUTE ONLY): 1100 SLP Time Calculation (min) (ACUTE ONLY): 14 min  Past Medical History:  Past Medical History:  Diagnosis Date  . Brain lesion 09/14/2020  . Hyperlipidemia   . Liver lesion 09/14/2020   Past Surgical History:  Past Surgical History:  Procedure Laterality Date  . APPLICATION OF CRANIAL NAVIGATION  09/29/2020   Procedure: APPLICATION OF CRANIAL NAVIGATION;  Surgeon: Judith Part, MD;  Location: [facility];  Service: Neurosurgery;;  . Kyla Balzarine Left 09/29/2020   Procedure: Left craniotomy for tumor resection with brainlab;  Surgeon: Judith Part, MD;  Location: [facility];  Service: Neurosurgery;  Laterality: Left;  . HERNIA REPAIR     left side inguinal hernia about 18-20 years   HPI:  Pt is a 55 y.o. male who presents with AMS, R side neglect and weakness, aphasia, and urinary incontinence since 1/31. Pt has hx of multiple brain mets and craniotomy with tumor resection on the L 1/14. 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 S/p cycle 1 ipilimumab/nivolumab 1/31 with pt stopping Decadron due to reported interaction with his chemotherapy plan. CT showed significant interval increase in number and size of numerous infratentorial and supratentorial hemorrhagic metastases with significant vasogenic edema.  Resulting effacement of the right lateral ventricle noted. PMH of liver lesion and hyperlipidemia.   Assessment / Plan / Recommendation Clinical Impression  Pt's oropharyngeal swallow appears to be functional, with no overt s/s of aspiration nor dysphagia observed. He uses a lingual sweep and liquid wash independently when clearing larger bites of dry crackers from his mouth. His wife says that he has been primarily having trouble masticating certain meats, like  chicken and fish, when they are very dry. They have been selecting other types of foods and/or mixing foods on his tray in order to facilitate this. No acute SLP f/u indicated for swallowing at this time. Would continue with regular solids and thin liquids to allow pt/wife access to select the foods he can masticate well. SLP Visit Diagnosis: Dysphagia, unspecified (R13.10)    Aspiration Risk  No limitations    Diet Recommendation Regular;Thin liquid   Liquid Administration via: Cup;Straw Medication Administration: Whole meds with liquid Supervision: Patient able to self feed;Intermittent supervision to cue for compensatory strategies Compensations: Minimize environmental distractions Postural Changes: Seated upright at 90 degrees    Other  Recommendations Oral Care Recommendations: Oral care BID   Follow up Recommendations 24 hour supervision/assistance      Frequency and Duration            Prognosis Prognosis for Safe Diet Advancement: Good      Swallow Study   General HPI: Pt is a 55 y.o. male who presents with AMS, R side neglect and weakness, aphasia, and urinary incontinence since 1/31. Pt has hx of multiple brain mets and craniotomy with tumor resection on the L 1/14. 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 S/p cycle 1 ipilimumab/nivolumab 1/31 with pt stopping Decadron due to reported interaction with his chemotherapy plan. CT showed significant interval increase in number and size of numerous infratentorial and supratentorial hemorrhagic metastases with significant vasogenic edema.  Resulting effacement of the right lateral ventricle noted. PMH of liver lesion and hyperlipidemia. Type of Study: Bedside Swallow Evaluation Previous Swallow Assessment: none in chart Diet Prior to this Study: Regular;Thin liquids Temperature  Spikes Noted: No Respiratory Status: Room air History of Recent Intubation: No Behavior/Cognition: Alert;Cooperative;Pleasant mood Oral  Cavity Assessment: Within Functional Limits Oral Care Completed by SLP: No Oral Cavity - Dentition: Adequate natural dentition Vision: Functional for self-feeding Self-Feeding Abilities: Able to feed self Patient Positioning: Upright in bed Baseline Vocal Quality: Normal Volitional Cough: Strong Volitional Swallow: Unable to elicit    Oral/Motor/Sensory Function Overall Oral Motor/Sensory Function: Within functional limits   Ice Chips Ice chips: Not tested   Thin Liquid Thin Liquid: Within functional limits Presentation: Cup;Self Fed;Straw    Nectar Thick Nectar Thick Liquid: Not tested   Honey Thick Honey Thick Liquid: Not tested   Puree Puree: Within functional limits Presentation: Self Fed;Spoon   Solid     Solid: Within functional limits Presentation: Self Fed      Osie Bond., M.A. [place] Pager [phone] Office [phone]  10/25/2020,12:07 PM

## 2020-10-25 NOTE — Progress Notes (Signed)
PT Cancellation Note  Patient Details Name: Sean [name] MRN: [id number] DOB: [birth year]   Cancelled Treatment:    Reason Eval/Treat Not Completed: Fatigue/lethargy limiting ability to participate. Pt's wife reports pt has been lethargic/"groggy" all morning and is currently sleeping. She requested PT come back later in day when pt is hopefully more awake to allow for a more productive session. Will follow-up later in day.  Moishe Spice, PT, DPT Acute Rehabilitation Services  Pager: [phone] Office: [place] 10/25/2020, 10:18 AM

## 2020-10-25 NOTE — Evaluation (Signed)
Speech Language Pathology Evaluation Patient Details Name: Sean [name] MRN: [id number] DOB: 1966-04-17 Today's Date: 10/25/2020 Time: 4742-5956 SLP Time Calculation (min) (ACUTE ONLY): 24 min  Problem List:  Patient Active Problem List   Diagnosis Date Noted  . Brain metastasis ([place]) 10/20/2020  . Melanoma of skin ([place]) 10/05/2020  . Status post craniotomy 09/29/2020  . Brain tumor ([place]) 09/29/2020  . Elevated blood pressure reading 09/15/2020  . 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 Uncomplicated alcohol dependence ([place])   . Seizure ([place])   . Hypokalemia   . Hyponatremia   . Elevated LFTs   . Brain metastases ([place]) 09/14/2020   Past Medical History:  Past Medical History:  Diagnosis Date  . Brain lesion 09/14/2020  . Hyperlipidemia   . Liver lesion 09/14/2020   Past Surgical History:  Past Surgical History:  Procedure Laterality Date  . APPLICATION OF CRANIAL NAVIGATION  09/29/2020   Procedure: APPLICATION OF CRANIAL NAVIGATION;  Surgeon: Judith Part, MD;  Location: [facility];  Service: Neurosurgery;;  . Kyla Balzarine Left 09/29/2020   Procedure: Left craniotomy for tumor resection with brainlab;  Surgeon: Judith Part, MD;  Location: [facility];  Service: Neurosurgery;  Laterality: Left;  . HERNIA REPAIR     left side inguinal hernia about 18-20 years   HPI:  Pt is a 55 y.o. male who presents with AMS, R side neglect and weakness, aphasia, and urinary incontinence since 1/31. Pt has hx of multiple brain mets and craniotomy with tumor resection on the L 1/14. 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 S/p cycle 1 ipilimumab/nivolumab 1/31 with pt stopping Decadron due to reported interaction with his chemotherapy plan. CT showed significant interval increase in number and size of numerous infratentorial and supratentorial hemorrhagic metastases with significant vasogenic edema.  Resulting effacement of the right lateral ventricle noted.  PMH of liver lesion and hyperlipidemia.   Assessment / Plan / Recommendation Clinical Impression  Pt presents with decreased processing speed and initiation that impacts cognitive and communicative functions. He does not consistently respond to questioning, but responds best to more direct, simple yes/no questions than he does open-ended questions. Verbal expression is primarily at the word to phrase level. He follows some one-step commands but needs repetitions and cues to attend at times. Pt recalled 1/4 words after a five-minute delay, increasing accuracy to 2/4 words when given multiple choices. Pt also seems to have reduced awareness of current cognitive and physical deficits and does not initiate problem solving to navigate around them. His wife does note that he is "groggy" this morning and has been more lively in his interactions at times. Will continue to follow to maximize cognition and communication.    SLP Assessment  SLP Recommendation/Assessment: Patient needs continued Speech Lanaguage Pathology Services SLP Visit Diagnosis: Cognitive communication deficit (R41.841)    Follow Up Recommendations  Inpatient Rehab;24 hour supervision/assistance    Frequency and Duration min 2x/week  2 weeks      SLP Evaluation Cognition  Overall Cognitive Status: Impaired/Different from baseline Arousal/Alertness: Awake/alert Orientation Level: Oriented to person;Oriented to place;Oriented to time Attention: Sustained Sustained Attention: Impaired Sustained Attention Impairment: Verbal complex Memory: Impaired Memory Impairment: Retrieval deficit;Decreased recall of new information Awareness: Impaired Awareness Impairment: Emergent impairment;Intellectual impairment Problem Solving: Impaired Problem Solving Impairment: Verbal basic;Functional basic Executive Function: Initiating Initiating: Impaired Initiating Impairment: Verbal basic;Functional basic Safety/Judgment: Impaired        Comprehension  Auditory Comprehension Overall Auditory Comprehension: Impaired Yes/No Questions: Impaired  Basic Immediate Environment Questions: 75-100% accurate Complex Questions: 0-24% accurate Commands: Impaired One Step Basic Commands: 50-74% accurate Conversation: Simple Interfering Components: Attention;Processing speed    Expression Expression Primary Mode of Expression: Verbal Verbal Expression Overall Verbal Expression: Impaired Initiation: Impaired Automatic Speech: Name;Social Response Level of Generative/Spontaneous Verbalization: Phrase Pragmatics: Impairment Impairments: Abnormal affect;Eye contact Non-Verbal Means of Communication: Not applicable   Oral / Motor  Oral Motor/Sensory Function Overall Oral Motor/Sensory Function: Within functional limits Motor Speech Overall Motor Speech: Appears within functional limits for tasks assessed   GO                    Osie Bond., M.A. [place] Acute Rehabilitation Services Pager [phone] Office [phone]  10/25/2020, 1:02 PM

## 2020-10-25 NOTE — Progress Notes (Signed)
Inpatient Rehabilitation Admissions Coordinator    I have insurance approval but no CIR bed available to admit him today. I spoke with his wife by phone and she is aware.  Danne Baxter, RN, MSN Rehab Admissions Coordinator [phone] 10/25/2020 12:17 PM

## 2020-10-26 ENCOUNTER — Other Ambulatory Visit: Payer: Self-pay

## 2020-10-26 ENCOUNTER — Inpatient Hospital Stay (HOSPITAL_COMMUNITY)
Admission: RE | Admit: 2020-10-26 | Discharge: 2020-11-09 | DRG: 056 | Disposition: A | Discharge status: Home Health | Payer: 59 | Source: Intra-hospital | Attending: Physical Medicine & Rehabilitation | Admitting: Physical Medicine & Rehabilitation

## 2020-10-26 ENCOUNTER — Encounter (HOSPITAL_COMMUNITY): Payer: Self-pay | Admitting: Physical Medicine & Rehabilitation

## 2020-10-26 DIAGNOSIS — I10 Essential (primary) hypertension: Secondary | ICD-10-CM | POA: Diagnosis present

## 2020-10-26 DIAGNOSIS — T380X5A Adverse effect of glucocorticoids and synthetic analogues, initial encounter: Secondary | ICD-10-CM | POA: Diagnosis not present

## 2020-10-26 DIAGNOSIS — G8194 Hemiplegia, unspecified affecting left nondominant side: Secondary | ICD-10-CM | POA: Diagnosis not present

## 2020-10-26 DIAGNOSIS — Z85820 Personal history of malignant melanoma of skin: Secondary | ICD-10-CM | POA: Diagnosis not present

## 2020-10-26 DIAGNOSIS — R569 Unspecified convulsions: Secondary | ICD-10-CM | POA: Diagnosis present

## 2020-10-26 DIAGNOSIS — Z87891 Personal history of nicotine dependence: Secondary | ICD-10-CM | POA: Diagnosis not present

## 2020-10-26 DIAGNOSIS — R4701 Aphasia: Secondary | ICD-10-CM | POA: Diagnosis present

## 2020-10-26 DIAGNOSIS — D72829 Elevated white blood cell count, unspecified: Secondary | ICD-10-CM | POA: Diagnosis not present

## 2020-10-26 DIAGNOSIS — D496 Neoplasm of unspecified behavior of brain: Secondary | ICD-10-CM | POA: Diagnosis not present

## 2020-10-26 DIAGNOSIS — Z8249 Family history of ischemic heart disease and other diseases of the circulatory system: Secondary | ICD-10-CM

## 2020-10-26 DIAGNOSIS — Z79899 Other long term (current) drug therapy: Secondary | ICD-10-CM

## 2020-10-26 DIAGNOSIS — K5900 Constipation, unspecified: Secondary | ICD-10-CM | POA: Diagnosis present

## 2020-10-26 DIAGNOSIS — G936 Cerebral edema: Secondary | ICD-10-CM | POA: Diagnosis present

## 2020-10-26 DIAGNOSIS — Z8371 Family history of colonic polyps: Secondary | ICD-10-CM | POA: Diagnosis not present

## 2020-10-26 DIAGNOSIS — G8191 Hemiplegia, unspecified affecting right dominant side: Principal | ICD-10-CM | POA: Diagnosis present

## 2020-10-26 DIAGNOSIS — C439 Malignant melanoma of skin, unspecified: Secondary | ICD-10-CM | POA: Diagnosis present

## 2020-10-26 DIAGNOSIS — E785 Hyperlipidemia, unspecified: Secondary | ICD-10-CM | POA: Diagnosis present

## 2020-10-26 DIAGNOSIS — C7931 Secondary malignant neoplasm of brain: Secondary | ICD-10-CM | POA: Diagnosis present

## 2020-10-26 DIAGNOSIS — R4189 Other symptoms and signs involving cognitive functions and awareness: Secondary | ICD-10-CM | POA: Diagnosis present

## 2020-10-26 MED ORDER — ACETAMINOPHEN 325 MG PO TABS
650.0000 mg | ORAL_TABLET | Freq: Four times a day (QID) | ORAL | Status: DC | PRN
  Administered 2020-10-29 – 2020-11-06 (×4): 650 mg via ORAL
  Filled 2020-10-26 (×4): qty 2

## 2020-10-26 MED ORDER — ONDANSETRON HCL 4 MG PO TABS: 4.0000 mg | ORAL_TABLET | Freq: Four times a day (QID) | ORAL | Status: DC | PRN

## 2020-10-26 MED ORDER — PANTOPRAZOLE SODIUM 40 MG PO TBEC: 40.0000 mg | DELAYED_RELEASE_TABLET | Freq: Every day | ORAL | Status: DC

## 2020-10-26 MED ORDER — ACETAMINOPHEN 650 MG RE SUPP: 650.0000 mg | Freq: Four times a day (QID) | RECTAL | Status: DC | PRN

## 2020-10-26 MED ORDER — LEVETIRACETAM 500 MG PO TABS
500.0000 mg | ORAL_TABLET | Freq: Two times a day (BID) | ORAL | Status: DC
  Administered 2020-10-26 – 2020-11-09 (×28): 500 mg via ORAL
  Filled 2020-10-26 (×9): qty 1
  Filled 2020-10-26: qty 2
  Filled 2020-10-26 (×18): qty 1

## 2020-10-26 MED ORDER — MELATONIN 5 MG PO TABS
5.0000 mg | ORAL_TABLET | Freq: Every evening | ORAL | Status: DC | PRN
  Administered 2020-10-28 – 2020-11-03 (×2): 5 mg via ORAL
  Filled 2020-10-26 (×2): qty 1

## 2020-10-26 MED ORDER — DEXAMETHASONE 4 MG PO TABS
4.0000 mg | ORAL_TABLET | Freq: Two times a day (BID) | ORAL | Status: DC
  Administered 2020-10-26 – 2020-10-31 (×10): 4 mg via ORAL
  Filled 2020-10-26 (×10): qty 1

## 2020-10-26 MED ORDER — ONDANSETRON HCL 4 MG/2ML IJ SOLN: 4.0000 mg | Freq: Four times a day (QID) | INTRAMUSCULAR | Status: DC | PRN

## 2020-10-26 MED ORDER — PANTOPRAZOLE SODIUM 40 MG PO TBEC
40.0000 mg | DELAYED_RELEASE_TABLET | Freq: Every day | ORAL | Status: DC
  Administered 2020-10-26: 40 mg via ORAL
  Filled 2020-10-26 (×2): qty 1

## 2020-10-26 MED ORDER — DEXAMETHASONE 4 MG PO TABS: 4.0000 mg | ORAL_TABLET | Freq: Two times a day (BID) | ORAL | Status: DC

## 2020-10-26 MED ORDER — OXYCODONE HCL 5 MG PO TABS
5.0000 mg | ORAL_TABLET | ORAL | Status: AC | PRN
  Administered 2020-10-27: 5 mg via ORAL
  Filled 2020-10-26: qty 1

## 2020-10-26 MED ORDER — SORBITOL 70 % SOLN
30.0000 mL | Freq: Every day | Status: DC | PRN
  Administered 2020-11-04 – 2020-11-08 (×2): 30 mL via ORAL
  Filled 2020-10-26 (×2): qty 30

## 2020-10-26 NOTE — Discharge Summary (Signed)
PATIENT DETAILS Name: Sean [name] Age: 55 y.o. Sex: male Date of Birth: 08/12/1966 MRN: [id number]. Admitting Physician: Lenore Cordia, MD BTD:VVOHY, Thayer Jew, MD  Admit Date: 10/20/2020 Discharge date: 10/26/2020  Recommendations for Outpatient Follow-up:  1. Follow up with PCP in 1-2 weeks 2. Please obtain CMP/CBC in one week 3. Please ensure follow-up with neuroradiology/neuro oncology/medical oncology/neurosurgery  Admitted From:  Home  Disposition: [place]: No  Equipment/Devices: None  Discharge Condition: Stable  CODE STATUS: FULL CODE  Diet recommendation:  Diet Order            Diet - low sodium heart healthy           Diet regular Room service appropriate? Yes; Fluid consistency: Thin  Diet effective now                  Brief Narrative: Patient is a 55 y.o. male with history of malignant melanoma with brain mets-s/p SRS to 10 brain lesions on 09/27/2020-s/p resection of left occipital/left frontal lesions on 09/29/2020-and 1 cycle of ipilimumab/nivolumab on 10/16/2020 presented with left-sided weakness, new onset seizure-further imaging studies suggestive of progression of untreated brain metastases.  Significant events: 2/4>> admit for left-sided weakness/seizure  Significant studies: 2/4>> CT head: Interval increase in number and size of numerous infarct tentorial/supratentorial hemorrhagic metastases with edema. 2/8> MRI brain: Progression of metastatic melanoma with multiple new/increased size lesions, moderate vasogenic edema, new mass-effect on the right lateral ventricle  Antimicrobial therapy: None  Microbiology data: 2/4>> blood culture: No growth 2/4>> urine culture: Multiple species  Procedures : None  Consults: Neurosurgery, oncology, palliative care  Brief Hospital Course: Left-sided weakness/seizure/encephalopathy: Stable-awake/alert this morning-etiology felt to be due to progression of brain metastases.  On  steroids and Keppra.  Neuro oncology/neurosurgery/oncology are following.  Plans are to discharge to CIR when bed available.  Deconditioning/debility: Due to acute illness-seizures-underlying metastatic melanoma-evaluated by rehab services-plans are for CIR on discharge.  Discharge Diagnoses:  Principal Problem:   Brain metastasis 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 [facility])   Discharge Instructions:  Activity:  As tolerated with Full fall precautions use walker/cane & assistance as needed  Discharge Instructions    Diet - low sodium heart healthy   Complete by: As directed    Increase activity slowly   Complete by: As directed    No dressing needed   Complete by: As directed      Allergies as of 10/26/2020   No Known Allergies     Medication List    STOP taking these medications   acetaminophen 500 MG tablet Commonly known as: TYLENOL   HYDROcodone-acetaminophen 5-325 MG tablet Commonly known as: NORCO/VICODIN   ondansetron 4 MG tablet Commonly known as: Zofran     TAKE these medications   dexamethasone 4 MG tablet Commonly known as: DECADRON Take 1 tablet (4 mg total) by mouth 2 (two) times daily.   levETIRAcetam 500 MG tablet Commonly known as: KEPPRA Take 1 tablet (500 mg total) by mouth 2 (two) times daily.   multivitamin with minerals Tabs tablet Take 1 tablet by mouth daily. What changed:   how much to take  additional instructions   pantoprazole 40 MG tablet Commonly known as: PROTONIX Take 1 tablet (40 mg total) by mouth daily at 12 noon.            Discharge Care Instructions  (From admission, onward)         Start  Ordered   10/26/20 0000  No dressing needed        10/26/20 1024          No Known Allergies  Other Procedures/Studies: DG Chest 2 View  Result Date: 10/20/2020 CLINICAL DATA:  Altered mental status EXAM: CHEST - 2 VIEW COMPARISON:  PET-CT September 25, 2020 FINDINGS: The heart size  and mediastinal contours are within normal limits. Streaky left basilar opacity not significantly changed from prior favor atelectasis. Known right upper lobe pulmonary nodule not visualized on today's exam. No pleural effusion. No pneumothorax. The visualized skeletal structures are unremarkable. IMPRESSION: Streaky left basilar opacity, favor atelectasis though developing infection not excluded. Electronically Signed   By: Dahlia Bailiff MD   On: 10/20/2020 17:55   CT HEAD WO CONTRAST  Result Date: 10/25/2020 CLINICAL DATA:  Altered level of consciousness, metastatic melanoma EXAM: CT HEAD WITHOUT CONTRAST TECHNIQUE: Contiguous axial images were obtained from the base of the skull through the vertex without intravenous contrast. COMPARISON:  10/20/2020, 10/24/2020 FINDINGS: Brain: Unenhanced images of the brain again demonstrate numerous hemorrhagic metastases compatible with known metastatic melanoma. The size and number of lesions is unchanged. Diffuse areas of vasogenic edema are seen throughout the bilateral cerebral hemispheres, with effacement of the lateral ventricles right greater than left. No significant midline shift. No acute infarct. No acute extra-axial fluid collections. Vascular: No hyperdense vessel or unexpected calcification. Skull: Normal. Negative for fracture or focal lesion. Stable postsurgical changes from prior left frontal and left occipital craniotomy. Sinuses/Orbits: No acute finding. Other: None. IMPRESSION: 1. Stable diffuse intracranial metastases compatible with metastatic melanoma. Stable vasogenic edema and effacement of the lateral ventricles. 2. No significant change since recent MRI and head CT. Electronically Signed   By: Randa Ngo M.D.   On: 10/25/2020 22:56   CT Head Wo Contrast  Result Date: 10/20/2020 CLINICAL DATA:  Mental status change, unknown cause. EXAM: CT HEAD WITHOUT CONTRAST TECHNIQUE: Contiguous axial images were obtained from the base of the skull  through the vertex without intravenous contrast. COMPARISON:  MRI head 09/30/20 FINDINGS: Brain: Interval increase in number and size of numerous supratentorial hemorrhagic metastases. The largest lesion on the left measures up to 5 point 1 cm in the left frontal lobe. The largest lesion on the right measures up to 3.3 cm in the right frontal lobe. Of note, there is a hemorrhagic lesion in the right thalamus. There is exuberant vasogenic edema associated with these lesions. No substantial midline shift. There is significant effacement of the right lateral ventricle. No hydrocephalus. Infratentorially, there is an 8 mm hemorrhagic metastasis within the superior right cerebellum. Vascular: No hyperdense vessel identified. Skull: No acute fracture. Prior left frontal and left parietal craniotomy. Sinuses/Orbits: No acute findings. Other: No mastoid effusions. IMPRESSION: Significant interval increase in number and size of numerous infratentorial and supratentorial hemorrhagic metastases with exuberant edema, as detailed above. Resulting effacement of the right lateral ventricle. An MRI with contrast could provide more complete evaluation if clinically indicated. Findings discussed with Dr. Gilford Raid Via telephone at 6:32 PM. Electronically Signed   By: Margaretha Sheffield MD   On: 10/20/2020 18:35   MR Brain W Wo Contrast  Result Date: 10/24/2020 CLINICAL DATA:  Metastatic melanoma EXAM: MRI HEAD WITHOUT AND WITH CONTRAST TECHNIQUE: Multiplanar, multiecho pulse sequences of the brain and surrounding structures were obtained without and with intravenous contrast. CONTRAST:  59mL GADAVIST GADOBUTROL 1 MMOL/ML IV SOLN COMPARISON:  None. FINDINGS: Brain: There are numerous lesions throughout the  brain, many of which are new and/or have increased in size. There are approximately 20 lesions. The largest lesions are in the left frontal operculum (3.4 x 3.0 cm, 11:15), right basal ganglia (3.5 x 2.5 cm, 11:13) and right  occipital lobe (2.8 x 1.9 cm, 16:26). The lesions demonstrate intrinsic hyperintense T1-weighted signal. Beyond that, there is little demonstrated contrast enhancement. There is new mass effect on the right lateral ventricle without significant midline shift. There is moderate vasogenic edema surrounding lesions in both frontal lobes, the right basal ganglia, both occipital lobes and both parietal lobes. Vascular: Normal flow voids. Skull and upper cervical spine: Normal marrow signal. Remote left anterior and posterior craniotomies. Sinuses/Orbits: Negative. Other: None. IMPRESSION: 1. Progression of metastatic melanoma with multiple new or increased size lesions. 2. Moderate vasogenic edema surrounding lesions in both frontal lobes, right basal ganglia, both occipital lobes and both parietal lobes. 3. New mass effect on the right lateral ventricle without significant midline shift. Electronically Signed   By: Ulyses Jarred M.D.   On: 10/24/2020 03:10   MR BRAIN W WO CONTRAST  Result Date: 09/30/2020 CLINICAL DATA:  Status post craniotomy for tumor section. EXAM: MRI HEAD WITHOUT AND WITH CONTRAST TECHNIQUE: Multiplanar, multiecho pulse sequences of the brain and surrounding structures were obtained without and with intravenous contrast. CONTRAST:  54mL GADAVIST GADOBUTROL 1 MMOL/ML IV SOLN COMPARISON:  Brain MRI 09/18/2020 FINDINGS: Brain: There are numerous diffusion restricting lesions throughout both hemispheres, which have associated hemorrhage and areas of contrast enhancement. New or increased sized lesions are as follows: 1. Right occipital lobe, 7 mm, previously 5 mm, series 18, image 24 2. Right basal ganglia, 3 mm, series 18, image 31 3. Right thalamus, 8 mm, image 34, mild surrounding edema, intrinsic hyperintense T1-weighted signal 4. Left frontal operculum, enhancing component 5 mm surrounding area 17 mm, possibly previously present as a punctate lesion, image 35 5. Superior left occipital lobe,  14 mm, previously 10 mm, image 37, mild surrounding edema 6. Anterior right frontal lobe 3 mm, image 37, intrinsic hyperintense T1-weighted signal 7. Left frontal lobe, 6 mm, previously 4 mm, image 44, intrinsic hyperintense T1-weighted signal Unchanged or smaller lesions: 1. Superior right cerebellum, 3 mm, series 18, image 23 2. Dominant left occipital lesion has been resected. There is minimal residual contrast enhancement adjacent to the occipital horn of the left lateral ventricle, image 32, mild edema 3. Left frontal lobe lesion status post resection with no residual nodular contrast enhancement, image 37, mild edema 4. Superomedial right parietal lobe, 4 mm, image 42 5. Left parietal lobe, 3 mm, image 45 6. Punctate left parietal lobe, image 43 7. Paramedian superior right frontal lobe, 14 mm, image 49, mild edema with intrinsic hyperintense T1-weighted signal Vascular: Normal flow voids. Skull and upper cervical spine: Left frontal and parietal craniotomies. Sinuses/Orbits: Negative. Other: None. IMPRESSION: 1. Status post resection of left frontal and occipital lesions with minimal residual contrast enhancement near the occipital horn of the left lateral ventricle. 2. 7 lesions that are new or larger. 3. Remainder of lesions are unchanged or smaller. Electronically Signed   By: Ulyses Jarred M.D.   On: 09/30/2020 01:34     TODAY-DAY OF DISCHARGE:  Subjective:   Sean [name] today has no headache,no chest abdominal pain,no new weakness tingling or numbness, feels much better wants to go home today.   Objective:   Blood pressure 105/72, pulse 74, temperature 97.9 F (36.6 C), temperature source Oral, resp. rate 17, height 5'  11" (1.803 m), weight 90 kg, SpO2 96 %.  Intake/Output Summary (Last 24 hours) at 10/26/2020 1024 Last data filed at 10/26/2020 0433 Gross per 24 hour  Intake 270 ml  Output 200 ml  Net 70 ml   Filed Weights   10/20/20 1701  Weight: 90 kg    Exam: Awake Alert,  Oriented *3, No new F.N deficits, Normal affect [place].AT,PERRAL Supple Neck,No JVD, No cervical lymphadenopathy appriciated.  Symmetrical Chest wall movement, Good air movement bilaterally, CTAB RRR,No Gallops,Rubs or new Murmurs, No Parasternal Heave +ve B.Sounds, Abd Soft, Non tender, No organomegaly appriciated, No rebound -guarding or rigidity. No Cyanosis, Clubbing or edema, No new Rash or bruise   PERTINENT RADIOLOGIC STUDIES: CT HEAD WO CONTRAST  Result Date: 10/25/2020 CLINICAL DATA:  Altered level of consciousness, metastatic melanoma EXAM: CT HEAD WITHOUT CONTRAST TECHNIQUE: Contiguous axial images were obtained from the base of the skull through the vertex without intravenous contrast. COMPARISON:  10/20/2020, 10/24/2020 FINDINGS: Brain: Unenhanced images of the brain again demonstrate numerous hemorrhagic metastases compatible with known metastatic melanoma. The size and number of lesions is unchanged. Diffuse areas of vasogenic edema are seen throughout the bilateral cerebral hemispheres, with effacement of the lateral ventricles right greater than left. No significant midline shift. No acute infarct. No acute extra-axial fluid collections. Vascular: No hyperdense vessel or unexpected calcification. Skull: Normal. Negative for fracture or focal lesion. Stable postsurgical changes from prior left frontal and left occipital craniotomy. Sinuses/Orbits: No acute finding. Other: None. IMPRESSION: 1. Stable diffuse intracranial metastases compatible with metastatic melanoma. Stable vasogenic edema and effacement of the lateral ventricles. 2. No significant change since recent MRI and head CT. Electronically Signed   By: Randa Ngo M.D.   On: 10/25/2020 22:56     PERTINENT LAB RESULTS: CBC: No results for input(s): WBC, HGB, HCT, PLT in the last 72 hours. CMET CMP     Component Value Date/Time   NA 137 10/24/2020 0847   K 3.4 (L) 10/24/2020 0847   CL 102 10/24/2020 0847   CO2 23  10/24/2020 0847   GLUCOSE 110 (H) 10/24/2020 0847   BUN 21 (H) 10/24/2020 0847   CREATININE 0.69 10/24/2020 0847   CREATININE 0.80 10/16/2020 1003   CALCIUM 9.2 10/24/2020 0847   PROT 6.6 10/20/2020 1729   ALBUMIN 3.3 (L) 10/20/2020 1729   AST 27 10/20/2020 1729   AST 26 10/16/2020 1003   ALT 48 (H) 10/20/2020 1729   ALT 72 (H) 10/16/2020 1003   ALKPHOS 110 10/20/2020 1729   BILITOT 1.5 (H) 10/20/2020 1729   BILITOT 1.0 10/16/2020 1003   GFRNONAA >60 10/24/2020 0847   GFRNONAA >60 10/16/2020 1003    GFR Estimated Creatinine Clearance: 109.8 mL/min (by C-G formula based on SCr of 0.69 mg/dL). No results for input(s): LIPASE, AMYLASE in the last 72 hours. No results for input(s): CKTOTAL, CKMB, CKMBINDEX, TROPONINI in the last 72 hours. Invalid input(s): POCBNP No results for input(s): DDIMER in the last 72 hours. No results for input(s): HGBA1C in the last 72 hours. No results for input(s): CHOL, HDL, LDLCALC, TRIG, CHOLHDL, LDLDIRECT in the last 72 hours. No results for input(s): TSH, T4TOTAL, T3FREE, THYROIDAB in the last 72 hours.  Invalid input(s): FREET3 No results for input(s): VITAMINB12, FOLATE, FERRITIN, TIBC, IRON, RETICCTPCT in the last 72 hours. Coags: No results for input(s): INR in the last 72 hours.  Invalid input(s): PT Microbiology: Recent Results (from the past 240 hour(s))  Culture, blood (routine x 2)  Status: None   Collection Time: 10/20/20  5:29 PM   Specimen: BLOOD  Result Value Ref Range Status   Specimen Description BLOOD RIGHT ANTECUBITAL  Final   Special Requests   Final    BOTTLES DRAWN AEROBIC AND ANAEROBIC Blood Culture results may not be optimal due to an inadequate volume of blood received in culture bottles   Culture   Final    NO GROWTH 5 DAYS Performed at [facility], [place] [address] [place], [place] [address]    Report Status 10/25/2020 FINAL  Final  Culture, blood (routine x 2)     Status: None   Collection Time:  10/20/20  5:29 PM   Specimen: BLOOD  Result Value Ref Range Status   Specimen Description BLOOD LEFT ANTECUBITAL  Final   Special Requests   Final    BOTTLES DRAWN AEROBIC AND ANAEROBIC Blood Culture results may not be optimal due to an inadequate volume of blood received in culture bottles   Culture   Final    NO GROWTH 5 DAYS Performed at [facility], [place] [address], [place], [place] [address]    Report Status 10/25/2020 FINAL  Final  Urine culture     Status: Abnormal   Collection Time: 10/20/20  5:31 PM   Specimen: Urine, Random  Result Value Ref Range Status   Specimen Description URINE, RANDOM  Final   Special Requests   Final    NONE Performed at [facility], [place] [address] [place], [place] [address]    Culture MULTIPLE SPECIES PRESENT, SUGGEST RECOLLECTION (A)  Final   Report Status 10/22/2020 FINAL  Final  SARS Coronavirus 2 by RT PCR (hospital order, performed in [facility] hospital lab) Nasopharyngeal Nasopharyngeal Swab     Status: None   Collection Time: 10/20/20  6:08 PM   Specimen: Nasopharyngeal Swab  Result Value Ref Range Status   SARS Coronavirus 2 NEGATIVE NEGATIVE Final    Comment: (NOTE) SARS-CoV-2 target nucleic acids are NOT DETECTED.  The SARS-CoV-2 RNA is generally detectable in upper and lower respiratory specimens during the acute phase of infection. The lowest concentration of SARS-CoV-2 viral copies this assay can detect is 250 copies / mL. A negative result does not preclude SARS-CoV-2 infection and should not be used as the sole basis for treatment or other patient management decisions.  A negative result may occur with improper specimen collection / handling, submission of specimen other than nasopharyngeal swab, presence of viral mutation(s) within the areas targeted by this assay, and inadequate number of viral copies (<250 copies / mL). A negative result must be combined with clinical observations, patient history, and  epidemiological information.  Fact Sheet for Patients:   StrictlyIdeas.no  Fact Sheet for Healthcare Providers: BankingDealers.co.za  This test is not yet approved or  cleared by the [place] FDA and has been authorized for detection and/or diagnosis of SARS-CoV-2 by FDA under an Emergency Use Authorization (EUA).  This EUA will remain in effect (meaning this test can be used) for the duration of the COVID-19 declaration under Section 564(b)(1) of the Act, 21 U.S.C. section 360bbb-3(b)(1), unless the authorization is terminated or revoked sooner.  Performed at [facility], [place] [address] [place], [place] [address]     FURTHER DISCHARGE INSTRUCTIONS:  Get Medicines reviewed and adjusted: Please take all your medications with you for your next visit with your Primary MD  Laboratory/radiological data: Please request your Primary MD to go  over all hospital tests and procedure/radiological results at the follow up, please ask your Primary MD to get all Hospital records sent to his/her office.  In some cases, they will be blood work, cultures and biopsy results pending at the time of your discharge. Please request that your primary care M.D. goes through all the records of your hospital data and follows up on these results.  Also Note the following: If you experience worsening of your admission symptoms, develop shortness of breath, life threatening emergency, suicidal or homicidal thoughts you must seek medical attention immediately by calling 911 or calling your MD immediately  if symptoms less severe.  You must read complete instructions/literature along with all the possible adverse reactions/side effects for all the Medicines you take and that have been prescribed to you. Take any new Medicines after you have completely understood and accpet all the possible adverse reactions/side effects.   Do not drive when taking Pain  medications or sleeping medications (Benzodaizepines)  Do not take more than prescribed Pain, Sleep and Anxiety Medications. It is not advisable to combine anxiety,sleep and pain medications without talking with your primary care practitioner  Special Instructions: If you have smoked or chewed Tobacco  in the last 2 yrs please stop smoking, stop any regular Alcohol  and or any Recreational drug use.  Wear Seat belts while driving.  Please note: You were cared for by a hospitalist during your hospital stay. Once you are discharged, your primary care physician will handle any further medical issues. Please note that NO REFILLS for any discharge medications will be authorized once you are discharged, as it is imperative that you return to your primary care physician (or establish a relationship with a primary care physician if you do not have one) for your post hospital discharge needs so that they can reassess your need for medications and monitor your lab values.  Total Time spent coordinating discharge including counseling, education and face to face time equals 25 minutes.  SignedOren Binet 10/26/2020 10:24 AM

## 2020-10-26 NOTE — Progress Notes (Signed)
Inpatient Rehabilitation  Patient information reviewed and entered into eRehab system by Ernie Sagrero M. Talyah Seder, M.A., CCC/SLP, PPS Coordinator.  Information including medical coding, functional ability and quality indicators will be reviewed and updated through discharge.    

## 2020-10-26 NOTE — Progress Notes (Signed)
Inpatient Rehabilitation Care Coordinator Assessment and Plan Patient Details  Name: Sean [name] MRN: [id number] Date of Birth: 1966/03/23  Today's Date: 10/26/2020  Hospital Problems: Principal Problem:   Malignant melanoma [facility])  Past Medical History:  Past Medical History:  Diagnosis Date  . Brain lesion 09/14/2020  . Hyperlipidemia   . Liver lesion 09/14/2020   Past Surgical History:  Past Surgical History:  Procedure Laterality Date  . APPLICATION OF CRANIAL NAVIGATION  09/29/2020   Procedure: APPLICATION OF CRANIAL NAVIGATION;  Surgeon: Judith Part, MD;  Location: [place];  Service: Neurosurgery;;  . Kyla Balzarine Left 09/29/2020   Procedure: Left craniotomy for tumor resection with brainlab;  Surgeon: Judith Part, MD;  Location: [place];  Service: Neurosurgery;  Laterality: Left;  . HERNIA REPAIR     left side inguinal hernia about 18-20 years   Social History:  reports that he has quit smoking. He has never used smokeless tobacco. He reports current alcohol use. He reports that he does not use drugs.  Family / Support Systems Marital Status: Married How Long?: 6 years Patient Roles: Spouse Spouse/Significant Other: [name] (wife): [phone] Children: No children Other Supports: [name] (sister): [phone] Anticipated Caregiver: wife, and various siblings who will assist with care Ability/Limitations of Caregiver: None reported Caregiver Availability: 24/7 Family Dynamics: Pt lives with wife, and both were working.  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 Education: some college. Read: Yes Write: Yes Employment Status: Employed Name of Employer: [employer] of Employment: 6 (years) Return to Work Plans: Pt intends to return to work if able. FAmily reports FMLA has been completed, and STD disability has started. Legal History/Current Legal Issues:  Sister denies Guardian/Conservator: N/A   Abuse/Neglect Abuse/Neglect Assessment Can Be Completed: Unable to assess, patient is non-responsive or altered mental status Physical Abuse: Denies Verbal Abuse: Denies Sexual Abuse: Denies Exploitation of patient/patient's resources: Denies Self-Neglect: Denies  Emotional Status Pt's affect, behavior and adjustment status: Pt was sleep during SW assessment. Recent Psychosocial Issues: Sister denies Psychiatric History: sister denies Substance Abuse History: Sister reportsd occasional etoh use; quit ciagerettes 20+ years ago.  Patient / Family Perceptions, Expectations & Goals Pt/Family understanding of illness & functional limitations: Pt family has a general understanding of care needs Premorbid pt/family roles/activities: Independent Anticipated changes in roles/activities/participation: Assistance with ADLs/IADLs Pt/family expectations/goals: Pt sister's goal is to have him discharge to home.  Community Resources [employer] Scripts: None Premorbid Home Care/DME Agencies: None Transportation available at discharge: family Resource referrals recommended: Neuropsychology  Discharge Planning Living Arrangements: Spouse/significant other Support Systems: Spouse/significant other,Other relatives Type of Residence: Private residence Insurance Resources: Multimedia programmer (specify) Sports administrator) Financial Resources: Employment (Short term disability through employer) Museum/gallery curator Screen Referred: No Living Expenses: Medical laboratory scientific officer Management: Patient Does the patient have any problems obtaining your medications?: No Home Management: Pt and wife managed care needs Patient/Family Preliminary Plans: TBD Care Coordinator Anticipated Follow Up Needs: HH/OP  Clinical Impression SW went to room to complete assessment with pt but pt sleeping. SW completed assessment with his sister [name]. SW introduced self, explained role, and discuss discharge process.  She has reported she is primary contact. She states that pt will have support from wife and siblings (2 brothers and sister). Pt has no HCPOA. 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 States no VA benefits but his brother is working on getting an application completed. No DME. Sister states they are working on building a  ramp, and pt has access to a transport chair. Sw informed there will be follow-up once there are updates from team conference.   *SW called pt wife [name] to introduce self, explain role, and discuss discharge process. She confirms pt sister [name] is primary contact. SW informed medical team.   Rana Snare 10/26/2020, 4:33 PM

## 2020-10-26 NOTE — TOC Transition Note (Signed)
Transition of Care [facility]) - CM/SW Discharge Note   Patient Details  Name: Sean [name] MRN: [id number] Date of Birth: [birth year]  [facility] CM/SW Contact:  Pollie Friar, RN Phone Number: 10/26/2020, 12:20 PM   Clinical Narrative:    Pt is discharging to CIR today. CM signing off.   Final next level of care: IP Rehab Facility Barriers to Discharge: No Barriers Identified   Patient Goals and CMS Choice        Discharge Placement                       Discharge Plan and Services                                     Social Determinants of Health (SDOH) Interventions     Readmission Risk Interventions No flowsheet data found.

## 2020-10-26 NOTE — Progress Notes (Signed)
Meredith Staggers, MD  Physician  Physical Medicine and Rehabilitation  Consult Note     Signed  Date of Service:  10/23/2020  5:24 AM      Related encounter: ED to Hosp-Admission (Discharged) from 10/20/2020 in [place] [facility]       Signed      Expand All Collapse All     Show:Clear all [x] Manual[x] Template[] Copied  Added by: [x] Angiulli, Lavon Paganini, PA-C[x] Meredith Staggers, MD   [] Hover for details           Physical Medicine and Rehabilitation Consult Reason for Consult: Left side weakness and aphasia Referring Physician: [facility]     HPI: Sean [name] is a 55 y.o. 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 Betsy Coder and recently placed on dexamethasone with slow taper.  Per chart review patient lives with spouse.  1 level home 2 steps to entry.  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  Good family support.  Presented to 01/03/2021 with right side weakness and aphasia as well as fatigue x4 days.  CT of the head showed significant interval increase in number and size of numerous infratentorial and supratentorial hemorrhagic metastases with exuberant edema.  Resulting effacement of the right lateral ventricle.  Oncology service follow-up Dr. Alvy Bimler and awaiting full course of care..  Placed on high-dose intravenous Decadron.  Tolerating a regular consistency diet.  Therapy evaluations completed due to decline in ADLs and mobility with recommendations of physical medicine rehab consult.     Review of Systems  Constitutional: Negative for chills and fever.  HENT: Negative for hearing loss.   Eyes: Negative for blurred vision and double vision.  Respiratory: Negative for cough and shortness of breath.   Cardiovascular: Negative for chest pain and palpitations.  Gastrointestinal: Positive for  constipation. Negative for heartburn, nausea and vomiting.  Genitourinary: Negative for dysuria, flank pain and hematuria.  Musculoskeletal: Positive for myalgias.  Skin: Negative for rash.  Neurological: Positive for dizziness, weakness and headaches.  All other systems reviewed and are negative.       Past Medical History:  Diagnosis Date  . Brain lesion 09/14/2020  . Hyperlipidemia    . Liver lesion 09/14/2020         Past Surgical History:  Procedure Laterality Date  . APPLICATION OF CRANIAL NAVIGATION   09/29/2020    Procedure: APPLICATION OF CRANIAL NAVIGATION;  Surgeon: Judith Part, MD;  Location: [place];  Service: Neurosurgery;;  . Kyla Balzarine Left 09/29/2020    Procedure: Left craniotomy for tumor resection with brainlab;  Surgeon: Judith Part, MD;  Location: [place];  Service: Neurosurgery;  Laterality: Left;  . HERNIA REPAIR        left side inguinal hernia about 18-20 years         Family History  Problem Relation Age of Onset  . Osteoarthritis Mother    . Hypertension Father    . Colon polyps Father    . Colon polyps Brother    . Colon polyps Brother    . Colon cancer Neg Hx    . Stomach cancer Neg Hx    . Rectal cancer Neg Hx    . Esophageal cancer Neg Hx    . Liver cancer Neg Hx      Social History:  reports that he has quit smoking. He has never used smokeless tobacco. He  reports current alcohol use. He reports that he does not use drugs. Allergies: No Known Allergies       Medications Prior to Admission  Medication Sig Dispense Refill  . acetaminophen (TYLENOL) 500 MG tablet Take 1,000 mg by mouth every 6 (six) hours as needed for mild pain.      Marland Kitchen HYDROcodone-acetaminophen (NORCO/VICODIN) 5-325 MG tablet Take 1 tablet by mouth every 6 (six) hours as needed for moderate pain. 30 tablet 0  . levETIRAcetam (KEPPRA) 500 MG tablet Take 1 tablet (500 mg total) by mouth 2 (two) times daily. 60 tablet 0  . Multiple Vitamin (MULTIVITAMIN WITH MINERALS)  TABS tablet Take 1 tablet by mouth daily. (Patient taking differently: Take 2 tablets by mouth daily. gummy)      . ondansetron (ZOFRAN) 4 MG tablet Take 1 tablet (4 mg total) by mouth daily as needed for nausea or vomiting. 30 tablet 0  . pantoprazole (PROTONIX) 40 MG tablet Take 1 tablet (40 mg total) by mouth daily at 12 noon. 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 Functional Status:  Mobility: Bed Mobility Overal bed mobility: Needs Assistance Bed Mobility: Sit to Supine,Rolling Rolling: Min assist Supine to sit: HOB elevated,Mod assist Sit to supine: Mod assist General bed mobility comments: Pt sitting EOB when OT arrived, mod A to sequence legs and manage trunk down safely, able to roll for fresh pad placement with min A and cues for sequencing Transfers Overall transfer level: Needs assistance Equipment used: 1 person hand held assist Transfers: Sit to/from [employer] Sit to Stand: Min assist Stand pivot transfers: Min assist General transfer comment: min A to power up from bed and BSC, "dancing" motion used by therapist to engage BLE in transfer back to bed. Ambulation/Gait Ambulation/Gait assistance: Mod assist Gait Distance (Feet): 60 Feet Assistive device: Rolling walker (2 wheeled) Gait Pattern/deviations: Step-through pattern,Decreased step length - left,Decreased  stride length,Decreased dorsiflexion - left,Staggering left General Gait Details: Constant assistance at RW due to pt pushing it distal to his body despite cues to correct. Pt ambulates at slow pace with unsteadiness noted, drifting to L side of RW. Neglect of L side with L foot getting lateral to RW leg on occasion and L hand releasing from RW grip. Decreased L step length and foot clearance. ModA for management of RW and to maintain safety. Gait velocity: reduced Gait velocity interpretation: <1.8 ft/sec, indicate of risk for recurrent falls   ADL: ADL Overall ADL's : Needs assistance/impaired Eating/Feeding: Moderate assistance,With caregiver independent assisting,Sitting Grooming: Sitting,Wash/dry hands,Minimal assistance Upper Body Bathing: Moderate assistance,Sitting Lower Body Bathing: Moderate assistance,Sitting/lateral leans Upper Body Dressing : Moderate assistance,Sitting Lower Body Dressing: Maximal assistance,Sit to/from stand Toilet Transfer: Minimal assistance,Stand-pivot,BSC Toilet Transfer Details (indicate cue type and reason): one person HHA Toileting- Clothing Manipulation and Hygiene: Moderate assistance,+2 for safety/equipment,Sit to/from stand Toileting - Clothing Manipulation Details (indicate cue type and reason): Pt able to stand with OT and wife performing rear peri care Functional mobility during ADLs: Moderate assistance,Cueing for sequencing (1 person HHA,  SPT from bed>BSC>bed) General ADL Comments: Pt with deficits in coordination, cognition, vision, expressive language?, balance   Cognition: Cognition Overall Cognitive Status: Impaired/Different from baseline Orientation Level: Oriented to person,Disoriented to place,Disoriented to time,Disoriented to situation Cognition Arousal/Alertness: Awake/alert Behavior During Therapy: WFL for tasks assessed/performed Overall Cognitive Status: Impaired/Different from baseline Area of Impairment:  Attention,Memory,Following commands,Safety/judgement,Awareness,Problem solving,Orientation Orientation Level: Place,Situation Current Attention Level: Sustained Memory: Decreased short-term memory,Decreased recall of precautions Following Commands: Follows one step commands inconsistently,Follows one step commands with increased time Safety/Judgement: Decreased awareness of safety,Decreased awareness of deficits Awareness: Intellectual Problem Solving: Decreased initiation,Slow processing,Difficulty sequencing,Requires verbal cues,Requires tactile cues General Comments: Pt oriented to self, and sometimes "joking" around with therapist but likely confusion as well. Slightly impulsive, but following commands with extra time. attempted to shake hands with L hand - vision impaired and requires further evaluation   Blood pressure 122/77, pulse 67, temperature 97.6 F (36.4 C), temperature source Oral, resp. rate 17, height 5\' 11"  (1.803 m), weight 90 kg, SpO2 95 %. Physical Exam Constitutional:      General: He is not in acute distress. Cardiovascular:     Rate and Rhythm: Normal rate.  Pulmonary:     Effort: Pulmonary effort is normal.  Abdominal:     Palpations: Abdomen is soft.  Neurological:     Comments: Patient is awake alert no acute distress.  Makes eye contact with examiner provides his name with some delay in processing.   Word finding deficits. Right hemiparesis. Follows simple commands.       Lab Results Last 24 Hours  No results found for this or any previous visit (from the past 24 hour(s)).   Imaging Results (Last 48 hours)  No results found.       Assessment/Plan: Diagnosis: malignant melanoma with numerous left brain mets and resulting right hemiparesis and word finding deficits.  1. Does the need for close, 24 hr/day medical supervision in concert with the patient's rehab needs make it unreasonable for this patient to be served in a less intensive setting?  Yes 2. Co-Morbidities requiring supervision/potential complications: oncological considerations 3. Due to bladder management, bowel management, safety, skin/wound care, disease management, medication administration, pain management and patient education, does the patient require 24 hr/day rehab nursing? Yes 4. Does the patient require coordinated care of a physician, rehab nurse, therapy disciplines of PT, OT, SLP to address physical and functional deficits in the context of the above medical diagnosis(es)? Yes Addressing deficits in the following areas: balance, endurance, locomotion, strength, transferring, bowel/bladder control, bathing, dressing, feeding, grooming, toileting, cognition, speech, language, swallowing and psychosocial support 5. Can the patient actively participate in an intensive therapy program of at least 3 hrs of therapy per day at least 5 days per week? Yes 6. The potential for patient to make measurable gains while on inpatient rehab is good 7. Anticipated functional outcomes upon discharge from inpatient rehab are supervision  with PT, supervision and min assist with OT, supervision and min assist with SLP. 8. Estimated rehab length of stay to reach the above functional goals is: 14-20 days 9. Anticipated discharge destination: Home 10. Overall Rehab/Functional Prognosis: good   RECOMMENDATIONS: This patient's condition is appropriate for continued rehabilitative care in the following setting: CIR Patient has agreed to participate in recommended program. Potentially Note that insurance prior authorization may be required for reimbursement for recommended care.   Comment: Rehab Admissions Coordinator to follow up.   Thanks,   Meredith Staggers, MD, Mellody Drown   I  have examined pertinent labs and radiographic images. I have reviewed and concur with the physician assistant's documentation above.     Cathlyn Parsons, PA-C 10/23/2020          Revision History                         Routing History                   Note Details  Author Meredith Staggers, MD File Time 10/23/2020  1:41 PM  Author Type Physician Status Signed  Last Editor Meredith Staggers, MD Service Physical Medicine and [place] # [id number] Admit Date 10/26/2020

## 2020-10-26 NOTE — Plan of Care (Signed)

## 2020-10-26 NOTE — H&P (Signed)
Physical Medicine and Rehabilitation Admission H&P    No chief complaint on file. 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 Betsy Coder of oncology services and recently placed on dexamethasone with slow taper.  Per chart review lives with spouse.  1 level home 2 steps to entry.  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  Good family support.  Presented 10/20/2020 with right side weakness and aphasia as well as fatigue x4 days.  Admission chemistries unremarkable aside glucose 104, ALT 48, hemoglobin 15, urine culture multiple species, lactic acid 0.9.  CT of the head showed significant interval increase in number and size of numerous infratentorial and supratentorial hemorrhagic metastasis with exuberant edema.  Resulting effacement of the right lateral ventricle.  Recent MRI of abdomen 09/15/2020 multifocal T1 hyperintense liver lesions, no kidney mass.  He underwent ultrasound-guided biopsy of a segment 4B liver lesion 09/18/2020 benign liver parenchyma with steatosis, no evidence of malignancy.  PET 09/25/2020 focal area of hypermetabolism in the right hilum, multiple areas of the liver, pelvic musculature, and left thigh-no CT correlate with any of these areas on the noncontrast CT, 10 mm anterior right upper lobe nodule with low-level hypermetabolism, additional tiny pulmonary nodules too small to characterize by PET.  Underwent resection of left occipital left frontal lesions 09/29/2020 pathology consistent with malignant melanoma.  MRI of the brain completed 10/24/2020 shows progression of metastatic melanoma with multiple new or increased size lesions.  Moderate vasogenic edema surrounding lesions in both frontal lobes right basal ganglia both occipital lobes and both parietal lobes.  New mass-effect on  the right lateral ventricle without significant midline shift.  Oncology follow-up Dr. Alvy Bimler as well as follow-up Dr. Mickeal Skinner and remains on high-dose intravenous Decadron.  Keppra initiated for CVA prophylaxis.  Palliative care consulted to establish goals of care.  Patient tolerating a regular consistency diet.  Therapy evaluations completed due to decline in ADLs and mobility patient was admitted for a comprehensive rehab program.   Pt got pain meds 20-30 minutes ago- very sleepy- kept falling asleep and wife noted this is normal with pain meds.  Also gets sedated with Protonix.  Also hasn't had BM in 2+ days- needs something for this.  Pt also an under-reporter and won't ask for pain meds- needs someone to give it.    Review of Systems  Constitutional: Negative for chills and fever.  HENT: Negative for hearing loss.   Eyes: Positive for blurred vision. Negative for double vision.  Respiratory: Negative for cough and shortness of breath.   Cardiovascular: Negative for chest pain, palpitations and leg swelling.  Gastrointestinal: Positive for constipation. Negative for heartburn, nausea and vomiting.  Genitourinary: Negative for dysuria, flank pain and hematuria.       Bouts of urinary incontinence  Musculoskeletal: Positive for myalgias.  Skin: Negative for rash.  Neurological: Positive for dizziness, speech change, weakness and headaches.  All other systems reviewed and are negative.  Past Medical History:  Diagnosis Date  . Brain lesion 09/14/2020  . Hyperlipidemia   . Liver lesion 09/14/2020   Past Surgical History:  Procedure Laterality Date  . APPLICATION OF CRANIAL NAVIGATION  09/29/2020   Procedure: APPLICATION OF CRANIAL NAVIGATION;  Surgeon: Judith Part, MD;  Location: [facility];  Service: Neurosurgery;;  . CRANIOTOMY Left 09/29/2020   Procedure: Left craniotomy  for tumor resection with brainlab;  Surgeon: Judith Part, MD;  Location: [facility];  Service:  Neurosurgery;  Laterality: Left;  . HERNIA REPAIR     left side inguinal hernia about 18-20 years   Family History  Problem Relation Age of Onset  . Osteoarthritis Mother   . Hypertension Father   . Colon polyps Father   . Colon polyps Brother   . Colon polyps Brother   . Colon cancer Neg Hx   . Stomach cancer Neg Hx   . Rectal cancer Neg Hx   . Esophageal cancer Neg Hx   . Liver cancer Neg Hx    Social History:  reports that he has quit smoking. He has never used smokeless tobacco. He reports current alcohol use. He reports that he does not use drugs. Allergies: No Known Allergies Medications Prior to Admission  Medication Sig Dispense Refill  . dexamethasone (DECADRON) 4 MG tablet Take 1 tablet (4 mg total) by mouth 2 (two) times daily.    Marland Kitchen levETIRAcetam (KEPPRA) 500 MG tablet Take 1 tablet (500 mg total) by mouth 2 (two) times daily. 60 tablet 0  . Multiple Vitamin (MULTIVITAMIN WITH MINERALS) TABS tablet Take 1 tablet by mouth daily. (Patient taking differently: Take 2 tablets by mouth daily. gummy)    . pantoprazole (PROTONIX) 40 MG tablet Take 1 tablet (40 mg total) by mouth daily at 12 noon. 30 tablet 0    Drug Regimen Review Drug regimen was reviewed and remains appropriate with no significant issues identified  Home:     Functional History:    Functional Status:  Mobility:          ADL:    Cognition:      Physical Exam: Blood pressure 108/73, pulse 74, temperature 97.8 F (36.6 C), resp. rate 17, SpO2 95 %. Physical Exam Vitals and nursing note reviewed. Exam conducted with a chaperone present.  Constitutional:      Appearance: Normal appearance.     Comments: Pt very sleepy/sedated- wife in room-she's tearful; nurse helped wife and I to put pt back in bed with RW and then scoot him up in bed- couldn't do due to sedation, NAD  HENT:     Head: Normocephalic.     Comments: L forehead spot from previous crani and L occipital scabs from crani- no  facial droop seen    Right Ear: External ear normal.     Left Ear: External ear normal.     Nose: Nose normal. No congestion.     Mouth/Throat:     Mouth: Mucous membranes are dry.     Pharynx: Oropharynx is clear. No oropharyngeal exudate.  Eyes:     Comments: Couldn't assess due to sedation- the EOMs or nystagmus  Cardiovascular:     Rate and Rhythm: Normal rate and regular rhythm.     Heart sounds: Normal heart sounds. No murmur heard. No gallop.   Pulmonary:     Effort: Pulmonary effort is normal. No respiratory distress.     Breath sounds: Normal breath sounds. No wheezing, rhonchi or rales.  Abdominal:     Comments: Soft, NT, ND, (+)BS - hypoactive  Genitourinary:    Comments: Has condom catheter- darker amber urine Musculoskeletal:     Cervical back: Normal range of motion and neck supple.     Comments: Poor participation- transferred to bed with RW and CGA of 2- due to sedation L side a little weaker in LUE and LLE- L neglect obvious  Skin:    Comments: A lot of freckles R AC fossa IV_ looks OK No skin breakdown on backside- seen with transfer-   Neurological:     Comments: Patient is alert in no acute distress.  Provides his name and age.  Follows simple commands.  He did have some delay in processing on naming place.  Delayed and slowed processing- jumbled words- very sedated- unable to test sensation due to sedation Lots of cues to participate in the part of exam he did- L neglect notable  Psychiatric:     Comments: sedated     No results found for this or any previous visit (from the past 48 hour(s)). CT HEAD WO CONTRAST  Result Date: 10/25/2020 CLINICAL DATA:  Altered level of consciousness, metastatic melanoma EXAM: CT HEAD WITHOUT CONTRAST TECHNIQUE: Contiguous axial images were obtained from the base of the skull through the vertex without intravenous contrast. COMPARISON:  10/20/2020, 10/24/2020 FINDINGS: Brain: Unenhanced images of the brain again demonstrate  numerous hemorrhagic metastases compatible with known metastatic melanoma. The size and number of lesions is unchanged. Diffuse areas of vasogenic edema are seen throughout the bilateral cerebral hemispheres, with effacement of the lateral ventricles right greater than left. No significant midline shift. No acute infarct. No acute extra-axial fluid collections. Vascular: No hyperdense vessel or unexpected calcification. Skull: Normal. Negative for fracture or focal lesion. Stable postsurgical changes from prior left frontal and left occipital craniotomy. Sinuses/Orbits: No acute finding. Other: None. IMPRESSION: 1. Stable diffuse intracranial metastases compatible with metastatic melanoma. Stable vasogenic edema and effacement of the lateral ventricles. 2. No significant change since recent MRI and head CT. Electronically Signed   By: Randa Ngo M.D.   On: 10/25/2020 22:56       Medical Problem List and Plan: 1.  Right side weakness with word finding deficits secondary to malignant melanoma with numerous left brain mets.  Follow-up oncology service Dr. Betsy Coder as well as Dr. Mickeal Skinner ongoing plan of care.  Continue Decadron 4 mg every 12 hours until further notice  -patient may  shower  -ELOS/Goals: min Assist - 14-18 days 2.  Antithrombotics: -DVT/anticoagulation: SCDs  -antiplatelet therapy: N/A 3. Pain Management: Tylenol  And oxycodone as needed for headaches 4. Mood: Melatonin 5 mg nightly as needed sleep  -antipsychotic agents: N/A 5. Neuropsych: This patient is capable of making decisions on his own behalf. 6. Skin/Wound Care: Routine skin checks 7. Fluids/Electrolytes/Nutrition: Routine in and outs with follow-up chemistries 8.  Seizure prophylaxis.  Keppra 500 mg twice daily 9. Sedation- made worse, per wife, by Protonix and pain meds- suggest moving Protonix to dinner time, per wife.  10. Constipation- LBM 2 days ago- will give sorbitol prn 11. Headaches- suggest Topamax 50 mg  QHS for headache prophylaxis.   Lavon Paganini Anguilli, PA-C 10/26/20  Courtney Heys, MD 10/26/2020

## 2020-10-26 NOTE — Progress Notes (Signed)
Inpatient Rehabilitation Admissions Coordinator  CIR bed is available for patient to admit to today. I met with patient and his wife at bedside and they are in agreement. Wife, [name], to return to work and his sister, [name] , will be the designated visitor at [employer]. I have alerted acute team and TOC. I will make the arrangements to admit today.  Danne Baxter, RN, MSN Rehab Admissions Coordinator [phone] 10/26/2020 10:55 AM

## 2020-10-26 NOTE — Progress Notes (Signed)
Physical Therapy Treatment Patient Details Name: Sean [name] MRN: [id number] DOB: October 21, 1965 Today's Date: 10/26/2020    History of Present Illness Pt is a 55 y.o. male who presents with AMS, R side neglect and weakness, aphasia, and urinary incontinence since 1/31. Pt has hx of multiple brain mets and craniotomy with tumor resection on the L 1/14. 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 S/p cycle 1 ipilimumab/nivolumab 1/31 with pt stopping Decadron due to reported interaction with his chemotherapy plan. CT showed significant interval increase in number and size of numerous infratentorial and supratentorial hemorrhagic metastases with significant vasogenic edema.  Resulting effacement of the right lateral ventricle noted. PMH of liver lesion and hyperlipidemia.    PT Comments    Pt with poor motor planning, needing multi-modal cues and physical assistance to initiate majority of tasks. Pt benefits from quiet environment, simple cues, and visual demonstration. Pt with improved midline alignment inside RW initially this date, but his L drift increased as he fatigued. Pt continues to neglect L side and bumps into obstacles, removes L hand from RW, and drags L leg, despite max cues to attend to it, placing him at risk for falls and injury. Will continue to follow acutely. Current recommendations remain appropriate.  Follow Up Recommendations  CIR     Equipment Recommendations  Rolling walker with 5" wheels;3in1 (PT);Hospital bed;Wheelchair (measurements PT);Wheelchair cushion (measurements PT) (may change with progress)    Recommendations for Other Services       Precautions / Restrictions Precautions Precautions: Fall Precaution Comments: L neglect, global visual deficits Restrictions Weight Bearing Restrictions: No    Mobility  Bed Mobility Overal bed mobility: Needs Assistance Bed Mobility: Supine to Sit     Supine to sit: HOB elevated;Mod assist     General  bed mobility comments: ModA with HHA to pull trunk to ascend as pt unable to initiate with simple verbal cues and demonstration to copy PT. Needing assistance at legs to manage off bed.  Transfers Overall transfer level: Needs assistance   Transfers: Sit to/from Stand Sit to Stand: Min assist         General transfer comment: Extra time and repeated cues to scoot anteriorly with physical assistance for feet placement. Pt pulls up on RW rather than pushing up from bed, despite cues. MinA for steadying and extra time to process and complete.  Ambulation/Gait Ambulation/Gait assistance: Mod assist;Min assist Gait Distance (Feet): 225 Feet Assistive device: Rolling walker (2 wheeled) Gait Pattern/deviations: Step-through pattern;Decreased step length - left;Decreased stride length;Decreased dorsiflexion - left;Staggering left Gait velocity: reduced Gait velocity interpretation: <1.8 ft/sec, indicate of risk for recurrent falls General Gait Details: Poor attention to L side with pt bumping obstacles on L and drifting to L, especially as pt fatigued final ~70 ft. Decreased L step length and height, providing verbal and tactile cues to improve, fair carryover. Min-modA to manage RW and stabilize pt. Pt neglects L hand and needs cues to attend to it for proper replacement on RW on occasion. Improved midline alignment in RW this date initially, but increased drift to L with fatigue.   Stairs             Wheelchair Mobility    Modified Rankin (Stroke Patients Only) Modified Rankin (Stroke Patients Only) Pre-Morbid Rankin Score: No symptoms Modified Rankin: Moderately severe disability     Balance Overall balance assessment: Needs assistance Sitting-balance support: No upper extremity supported;Feet supported Sitting balance-Leahy Scale: Good Sitting balance - Comments: No LOB sitting  statically EOB, supervision for safety.   Standing balance support: Bilateral upper extremity  supported;During functional activity Standing balance-Leahy Scale: Poor Standing balance comment: Bil UE support and external assist for safety.                            Cognition Arousal/Alertness: Awake/alert Behavior During Therapy: Flat affect;Impulsive Overall Cognitive Status: Impaired/Different from baseline Area of Impairment: Attention;Memory;Following commands;Safety/judgement;Awareness;Problem solving                   Current Attention Level: Sustained Memory: Decreased short-term memory;Decreased recall of precautions Following Commands: Follows one step commands with increased time;Follows multi-step commands inconsistently;Follows one step commands inconsistently Safety/Judgement: Decreased awareness of safety;Decreased awareness of deficits Awareness: Intellectual Problem Solving: Decreased initiation;Slow processing;Difficulty sequencing;Requires verbal cues;Requires tactile cues General Comments: Pt follows cues inconsistently and needs multi-modal cues and rewording or visual cues to get pt to appropriately follow directions. Apraxia noted as pt has poor motor planning with cues but can perform tasks that are more automatic and salient to his desires. Pt needing physical assistance to avoid obstacles, poor safety and deficits awareness. Easily distracted, needing quiet environment to follow cues.      Exercises      General Comments General comments (skin integrity, edema, etc.): Attempted LAQ but pt unable to maintain attention to complete correctly with max cues      Pertinent Vitals/Pain Pain Assessment: No/denies pain Pain Intervention(s): Monitored during session    Home Living                      Prior Function            PT Goals (current goals can now be found in the care plan section) Acute Rehab PT Goals Patient Stated Goal: to improve PT Goal Formulation: With patient/family Time For Goal Achievement:  11/04/20 Potential to Achieve Goals: Good Progress towards PT goals: Progressing toward goals    Frequency    Min 4X/week      PT Plan Current plan remains appropriate    Co-evaluation              AM-PAC PT "6 Clicks" Mobility   Outcome Measure  Help needed turning from your back to your side while in a flat bed without using bedrails?: A Lot Help needed moving from lying on your back to sitting on the side of a flat bed without using bedrails?: A Lot Help needed moving to and from a bed to a chair (including a wheelchair)?: A Lot Help needed standing up from a chair using your arms (e.g., wheelchair or bedside chair)?: A Little Help needed to walk in hospital room?: A Lot Help needed climbing 3-5 steps with a railing? : A Lot 6 Click Score: 13    End of Session Equipment Utilized During Treatment: Gait belt Activity Tolerance: Patient tolerated treatment well Patient left: with call bell/phone within reach;with family/visitor present;in chair;with chair alarm set Nurse Communication: Mobility status PT Visit Diagnosis: Unsteadiness on feet (R26.81);Other abnormalities of gait and mobility (R26.89);Muscle weakness (generalized) (M62.81);Difficulty in walking, not elsewhere classified (R26.2);Other symptoms and signs involving the nervous system (R29.898);Hemiplegia and hemiparesis Hemiplegia - Right/Left: Left Hemiplegia - dominant/non-dominant: Dominant Hemiplegia - caused by:  (brain mets)     Time: 2637-8588 PT Time Calculation (min) (ACUTE ONLY): 26 min  Charges:  $Gait Training: 8-22 mins $Therapeutic Activity: 8-22 mins  Moishe Spice, PT, DPT Acute Rehabilitation Services  Pager: [phone] Office: [place] 10/26/2020, 9:39 AM

## 2020-10-26 NOTE — Progress Notes (Signed)
Inpatient Rehabilitation Medication Review by a Pharmacist  A complete drug regimen review was completed for this patient to identify any potential clinically significant medication issues.  Clinically significant medication issues were identified:  no  Check AMION for pharmacist assigned to patient if future medication questions/issues arise during this admission.  Pharmacist comments:   Time spent performing this drug regimen review (minutes):  10 min  Nasra Counce, PharmD, BCPS, BCCP Clinical Pharmacist  Please check AMION for all [facility] Pharmacy phone numbers After 10:00 PM, call Main Pharmacy [phone]   

## 2020-10-26 NOTE — Progress Notes (Addendum)
HEMATOLOGY-ONCOLOGY PROGRESS NOTE  SUBJECTIVE: Sitting up in the recliner.  Wife is in his room.  He is able to converse but tends to doze off at times because he does receive pain medication.  Scheduled to transfer to CIR later today.  Oncology History  Melanoma of skin ([place])  10/05/2020 Initial Diagnosis   Melanoma of skin ([place])   10/05/2020 Cancer Staging   Staging form: Melanoma of the Skin, AJCC 8th Edition - Pathologic: Stage IV (pTX, pNX, pM1) - Signed by [name], MD on 10/05/2020   10/16/2020 -  Chemotherapy    Patient is on Treatment Plan: MELANOMA NIVOLUMAB + IPILIMUMAB (1/3) Q21D / NIVOLUMAB Q14D       PHYSICAL EXAMINATION:  Vitals:   10/26/20 1219 10/26/20 1238  BP: 122/85 111/86  Pulse: (!) 112 96  Resp: 17   Temp: 98 F (36.7 C)   SpO2: 98%    Filed Weights   10/20/20 1701  Weight: 90 kg    Intake/Output from previous day: 02/09 0701 - 02/10 0700 In: 270 [P.O.:270] Out: 900 [Urine:900]  GENERAL: Lethargic, arousable SKIN: skin color, texture, turgor are normal, no rashes or significant lesions OROPHARYNX: No thrush ABDOMEN:abdomen soft, non-tender and normal bowel sounds NEURO: Arousable, oriented to place and year, follows simple commands, moves all extremities to command   LABORATORY DATA:  I have reviewed the data as listed CMP Latest Ref Rng & Units 10/24/2020 10/22/2020 10/21/2020  Glucose 70 - 99 mg/dL 110(H) 167(H) 144(H)  BUN 6 - 20 mg/dL 21(H) 19 17  Creatinine 0.61 - 1.24 mg/dL 0.69 0.77 0.68  Sodium 135 - 145 mmol/L 137 139 139  Potassium 3.5 - 5.1 mmol/L 3.4(L) 4.0 4.0  Chloride 98 - 111 mmol/L 102 105 106  CO2 22 - 32 mmol/L 23 23 22   Calcium 8.9 - 10.3 mg/dL 9.2 9.0 8.9  Total Protein 6.5 - 8.1 g/dL - - -  Total Bilirubin 0.3 - 1.2 mg/dL - - -  Alkaline Phos 38 - 126 U/L - - -  AST 15 - 41 U/L - - -  ALT 0 - 44 U/L - - -    Lab Results  Component Value Date   WBC 7.7 10/22/2020   HGB 13.8 10/22/2020   HCT 38.2 (L)  10/22/2020   MCV 87.8 10/22/2020   PLT 207 10/22/2020   NEUTROABS 6.3 10/22/2020    DG Chest 2 View  Result Date: 10/20/2020 CLINICAL DATA:  Altered mental status EXAM: CHEST - 2 VIEW COMPARISON:  PET-CT September 25, 2020 FINDINGS: The heart size and mediastinal contours are within normal limits. Streaky left basilar opacity not significantly changed from prior favor atelectasis. Known right upper lobe pulmonary nodule not visualized on today's exam. No pleural effusion. No pneumothorax. The visualized skeletal structures are unremarkable. IMPRESSION: Streaky left basilar opacity, favor atelectasis though developing infection not excluded. Electronically Signed   By: Dahlia Bailiff MD   On: 10/20/2020 17:55   CT HEAD WO CONTRAST  Result Date: 10/25/2020 CLINICAL DATA:  Altered level of consciousness, metastatic melanoma EXAM: CT HEAD WITHOUT CONTRAST TECHNIQUE: Contiguous axial images were obtained from the base of the skull through the vertex without intravenous contrast. COMPARISON:  10/20/2020, 10/24/2020 FINDINGS: Brain: Unenhanced images of the brain again demonstrate numerous hemorrhagic metastases compatible with known metastatic melanoma. The size and number of lesions is unchanged. Diffuse areas of vasogenic edema are seen throughout the bilateral cerebral hemispheres, with effacement of the lateral ventricles right greater than left.  No significant midline shift. No acute infarct. No acute extra-axial fluid collections. Vascular: No hyperdense vessel or unexpected calcification. Skull: Normal. Negative for fracture or focal lesion. Stable postsurgical changes from prior left frontal and left occipital craniotomy. Sinuses/Orbits: No acute finding. Other: None. IMPRESSION: 1. Stable diffuse intracranial metastases compatible with metastatic melanoma. Stable vasogenic edema and effacement of the lateral ventricles. 2. No significant change since recent MRI and head CT. Electronically Signed   By:  Randa Ngo M.D.   On: 10/25/2020 22:56   CT Head Wo Contrast  Result Date: 10/20/2020 CLINICAL DATA:  Mental status change, unknown cause. EXAM: CT HEAD WITHOUT CONTRAST TECHNIQUE: Contiguous axial images were obtained from the base of the skull through the vertex without intravenous contrast. COMPARISON:  MRI head 09/30/20 FINDINGS: Brain: Interval increase in number and size of numerous supratentorial hemorrhagic metastases. The largest lesion on the left measures up to 5 point 1 cm in the left frontal lobe. The largest lesion on the right measures up to 3.3 cm in the right frontal lobe. Of note, there is a hemorrhagic lesion in the right thalamus. There is exuberant vasogenic edema associated with these lesions. No substantial midline shift. There is significant effacement of the right lateral ventricle. No hydrocephalus. Infratentorially, there is an 8 mm hemorrhagic metastasis within the superior right cerebellum. Vascular: No hyperdense vessel identified. Skull: No acute fracture. Prior left frontal and left parietal craniotomy. Sinuses/Orbits: No acute findings. Other: No mastoid effusions. IMPRESSION: Significant interval increase in number and size of numerous infratentorial and supratentorial hemorrhagic metastases with exuberant edema, as detailed above. Resulting effacement of the right lateral ventricle. An MRI with contrast could provide more complete evaluation if clinically indicated. Findings discussed with Dr. Gilford Raid Via telephone at 6:32 PM. Electronically Signed   By: Margaretha Sheffield MD   On: 10/20/2020 18:35   MR Brain W Wo Contrast  Result Date: 10/24/2020 CLINICAL DATA:  Metastatic melanoma EXAM: MRI HEAD WITHOUT AND WITH CONTRAST TECHNIQUE: Multiplanar, multiecho pulse sequences of the brain and surrounding structures were obtained without and with intravenous contrast. CONTRAST:  70mL GADAVIST GADOBUTROL 1 MMOL/ML IV SOLN COMPARISON:  None. FINDINGS: Brain: There are numerous  lesions throughout the brain, many of which are new and/or have increased in size. There are approximately 20 lesions. The largest lesions are in the left frontal operculum (3.4 x 3.0 cm, 11:15), right basal ganglia (3.5 x 2.5 cm, 11:13) and right occipital lobe (2.8 x 1.9 cm, 16:26). The lesions demonstrate intrinsic hyperintense T1-weighted signal. Beyond that, there is little demonstrated contrast enhancement. There is new mass effect on the right lateral ventricle without significant midline shift. There is moderate vasogenic edema surrounding lesions in both frontal lobes, the right basal ganglia, both occipital lobes and both parietal lobes. Vascular: Normal flow voids. Skull and upper cervical spine: Normal marrow signal. Remote left anterior and posterior craniotomies. Sinuses/Orbits: Negative. Other: None. IMPRESSION: 1. Progression of metastatic melanoma with multiple new or increased size lesions. 2. Moderate vasogenic edema surrounding lesions in both frontal lobes, right basal ganglia, both occipital lobes and both parietal lobes. 3. New mass effect on the right lateral ventricle without significant midline shift. Electronically Signed   By: Ulyses Jarred M.D.   On: 10/24/2020 03:10   MR BRAIN W WO CONTRAST  Result Date: 09/30/2020 CLINICAL DATA:  Status post craniotomy for tumor section. EXAM: MRI HEAD WITHOUT AND WITH CONTRAST TECHNIQUE: Multiplanar, multiecho pulse sequences of the brain and surrounding structures were obtained without  and with intravenous contrast. CONTRAST:  34mL GADAVIST GADOBUTROL 1 MMOL/ML IV SOLN COMPARISON:  Brain MRI 09/18/2020 FINDINGS: Brain: There are numerous diffusion restricting lesions throughout both hemispheres, which have associated hemorrhage and areas of contrast enhancement. New or increased sized lesions are as follows: 1. Right occipital lobe, 7 mm, previously 5 mm, series 18, image 24 2. Right basal ganglia, 3 mm, series 18, image 31 3. Right thalamus, 8  mm, image 34, mild surrounding edema, intrinsic hyperintense T1-weighted signal 4. Left frontal operculum, enhancing component 5 mm surrounding area 17 mm, possibly previously present as a punctate lesion, image 35 5. Superior left occipital lobe, 14 mm, previously 10 mm, image 37, mild surrounding edema 6. Anterior right frontal lobe 3 mm, image 37, intrinsic hyperintense T1-weighted signal 7. Left frontal lobe, 6 mm, previously 4 mm, image 44, intrinsic hyperintense T1-weighted signal Unchanged or smaller lesions: 1. Superior right cerebellum, 3 mm, series 18, image 23 2. Dominant left occipital lesion has been resected. There is minimal residual contrast enhancement adjacent to the occipital horn of the left lateral ventricle, image 32, mild edema 3. Left frontal lobe lesion status post resection with no residual nodular contrast enhancement, image 37, mild edema 4. Superomedial right parietal lobe, 4 mm, image 42 5. Left parietal lobe, 3 mm, image 45 6. Punctate left parietal lobe, image 43 7. Paramedian superior right frontal lobe, 14 mm, image 49, mild edema with intrinsic hyperintense T1-weighted signal Vascular: Normal flow voids. Skull and upper cervical spine: Left frontal and parietal craniotomies. Sinuses/Orbits: Negative. Other: None. IMPRESSION: 1. Status post resection of left frontal and occipital lesions with minimal residual contrast enhancement near the occipital horn of the left lateral ventricle. 2. 7 lesions that are new or larger. 3. Remainder of lesions are unchanged or smaller. Electronically Signed   By: Ulyses Jarred M.D.   On: 09/30/2020 01:34    ASSESSMENT AND PLAN: 1. Multiple brain metastases, unknown primary tumor site CT brain 09/14/2020-multiple hemorrhagic metastases with surrounding edema in the bilateral cerebral hemispheres CTs chest, abdomen, pelvis 09/14/2020-1.4 cm right upper lobe subpleural nodule, 0.5 cm right lower lobe nodule, multiple hypodensities in the liver  suspicious for metastases, 1.5 cm right upper pole hypodense kidney lesion-indeterminate MRI abdomen 09/15/2020-multifocal T1 hyperintense liver lesions, no kidney mass Ultrasound-guided biopsy of a segment 4B liver lesion 09/18/2020-benign liver parenchyma with steatosis, no evidence of malignancy PET 09/25/2020- focal area of hypermetabolism in the right hilum, multiple areas of the liver, pelvic musculature, and left thigh-no CT correlate with any of these areas on the noncontrast CT, 10 mm anterior right upper lobe nodule with low-level hypermetabolism, additional tiny pulmonary nodules too small to characterize by PET SRS to 10 brain lesions 09/27/2020 Resection of left occipital and left frontal lesions on 09/29/2020-pathology consistent with malignant melanoma Cycle 1 ipilimumab/nivolumab 10/16/2020 CT of the brain without contrast 10/26/2020 showed significant interval increase in the number and size of numerous metastases Brain MRI 10/24/2020-multiple new and increased brain metastases, moderate edema surrounding multiple lesions, new mass-effect at the right lateral ventricle   2.  Left-sided weakness, new onset seizure secondary to #1, weakness has resolved 3.  Hyperlipidemia 4.  Elevated liver enzymes and bilirubin Liver enzymes more elevated on repeat labs 09/27/2020, improved 09/28/2020 5.  Headache and nausea/vomiting secondary to #1, resolved 6.  Hospital admission 10/20/2020-encephalopathy  Mr. Hepburn appears stable.  He continues to have confusion.  Extensive review of the brain MRI by neuroradiology, neuro-oncology, and radiation oncology is consistent with  progression of untreated brain metastases.  Treated lesions have expected posttreatment changes.  The significant progression over several weeks is unusual.  It is possible the MRI findings are in part related to hemorrhage or tumor flare from immunotherapy.  Case was reviewed with an outside oncologist who thinks changes could be  related to a flare secondary to immunotherapy.  Would recommend nivolumab alone moving forward.  Recommend he continue dexamethasone 4 mg twice daily.  Okay to transition to p.o.  We will plan to slowly taper in the next few days.  Would like to get dexamethasone down to less than 2 mg/day prior to restarting immunotherapy.  Recommendations: 1. Continue dexamethasone 4 mg every 12 hours.  Okay to transition to p.o.  Will start a slow taper in the next few days. 2.  Physical therapy/rehabilitation admission 3.  Medical oncology will continue following him on the rehab unit.   LOS: 6 days   Mikey Bussing 10/26/20 Mr. San was interviewed and examined.  His wife was at the bedside when I saw him this morning.  He is more lethargic today.  He received oxycodone for a headache during the night.  I have discussed the case with the neuro oncology service.  I discussed the case with the melanoma service at [facility].  It is possible the current clinical and MRI findings are related to hemorrhage and tumor flare following immunotherapy and radiation.  The plan is to continue Decadron with a slow taper as tolerated.  We will plan for single agent nivolumab if the Decadron dose can be lowered to 2 mg or less.  We will also assess the systemic tumor burden next week.  Julieanne Manson, MD

## 2020-10-26 NOTE — Progress Notes (Addendum)
Report given to nurse Veleta Miners on [place]. RN asked for IV assess to be maintained. Pt wheeled off unit by this RN and the tech via bed. All items were collected and brought with the pt. Wife walked with [place] to the elevator as well.

## 2020-10-26 NOTE — Progress Notes (Signed)
Cristina Gong, RN  Rehab Admission Coordinator  Physical Medicine and Rehabilitation  PMR Pre-admission     Addendum  Date of Service:  10/23/2020  3:29 PM      Related encounter: ED to Hosp-Admission (Discharged) from 10/20/2020 in [place]           Show:Clear all [x] Manual[x] Template[x] Copied  Added by: [x] Julious Payer Vertis Kelch, RN   [] Hover for details  PMR Admission Coordinator Pre-Admission Assessment   Patient: Sean [name] is an 55 y.o., male MRN: [id number] DOB: 21-Jul-1966 Height: 5\' 11"  (180.3 cm) Weight: 90 kg                                                                                                                                                  Insurance Information HMO:     PPO:      PCP:      IPA:      80/20:      OTHER:  PRIMARY: [employer]  commercial Policy#: [id number]      Subscriber: pt CM Name: Elta Guadeloupe    Phone#: [phone]     Fax#: [phone] Pre-Cert#: [id number]      Employer: [employer] Benefits:  Phone #: [phone]     Name: 2/7 Eff. Date: 09/16/20     Deduct: $500      Out of Pocket Max: $2500      Life Max: none  CIR: 80%      SNF: 100% 90 day limit Outpatient: $25 per visit     Co-Pay: 60 visits combined Home Health: 75%      Co-Pay: 90 visits combined per year DME: 80%     Co-Pay: 20% Providers: in network  SECONDARY: none        Financial Counselor:       Phone#:    [employer]" for patients in Inpatient Rehabilitation Facilities with attached "Privacy Act [place] Records" was provided and verbally reviewed with: N/A   Emergency Contact Information         Contact Information     Name Relation Home Work Mobile    [place] Spouse     [phone]    Cathleen Corti     [phone]       Current Medical History  Patient Admitting Diagnosis: malignant melanoma with brain mets   History of Present Illness:  55 year old  right-handed male with history significant for 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 Betsy Coder of oncology services and recently placed on dexamethasone with slow taper. Presented 10/20/2020 with right side weakness and aphasia as well as fatigue x 4 days.  Admission chemistries unremarkable aside glucose 104, ALT 48, hemoglobin 15, urine culture multiple species, lactic acid 0.9.  CT  of the head showed significant interval increase in number and size of numerous infratentorial and supratentorial hemorrhagic metastasis with exuberant edema.  Resulting effacement of the right lateral ventricle.  Recent MRI of abdomen 09/15/2020 multifocal T1 hyperintense liver lesions, no kidney mass.  He underwent ultrasound-guided biopsy of a segment 4B liver lesion 09/18/2020 benign liver parenchyma with steatosis, no evidence of malignancy.  PET 09/25/2020 focal area of hypermetabolism in the right hilum, multiple areas of the liver, pelvic musculature, and left thigh-no CT correlate with any of these areas on the non contrast CT, 10 mm anterior right upper lobe nodule with low-level hypermetabolism, additional tiny pulmonary nodules too small to characterize by PET.  Underwent resection of left occipital left frontal lesions 09/29/2020 pathology consistent with malignant melanoma.  MRI of the brain completed 10/24/2020 shows progression of metastatic melanoma with multiple new or increased size lesions.  Moderate vasogenic edema surrounding lesions in both frontal lobes right basal ganglia both occipital lobes and both parietal lobes.  New mass-effect on the right lateral ventricle without significant midline shift.  Oncology follow-up Dr. Alvy Bimler as well as follow-up Dr. Mickeal Skinner and remains on high-dose intravenous Decadron.  Keppra initiated for CVA prophylaxis.  Palliative care consulted to establish goals of care.  Patient tolerating a regular  consistency diet.     Past Medical History      Past Medical History:  Diagnosis Date  . Brain lesion 09/14/2020  . Hyperlipidemia    . Liver lesion 09/14/2020      Family History  family history includes Colon polyps in his brother, brother, and father; Hypertension in his father; Osteoarthritis in his mother.   Prior Rehab/Hospitalizations:  Has the patient had prior rehab or hospitalizations prior to admission? Yes   Has the patient had major surgery during 100 days prior to admission? Yes   Current Medications    Current Facility-Administered Medications:  .  acetaminophen (TYLENOL) tablet 650 mg, 650 mg, Oral, Q6H PRN, 650 mg at 10/25/20 1828 **OR** acetaminophen (TYLENOL) suppository 650 mg, 650 mg, Rectal, Q6H PRN, Patel, Vishal R, MD .  dexamethasone (DECADRON) injection 4 mg, 4 mg, Intravenous, Q12H, Curcio, Kristin R, NP, 4 mg at 10/25/20 2150 .  levETIRAcetam (KEPPRA) tablet 500 mg, 500 mg, Oral, BID, Pham, Minh Q, RPH-CPP, 500 mg at 10/25/20 2153 .  melatonin tablet 5 mg, 5 mg, Oral, QHS PRN, Shela Leff, MD, 5 mg at 10/25/20 2153 .  ondansetron (ZOFRAN) tablet 4 mg, 4 mg, Oral, Q6H PRN **OR** ondansetron (ZOFRAN) injection 4 mg, 4 mg, Intravenous, Q6H PRN, Posey Pronto, Vishal R, MD .  oxyCODONE (Oxy IR/ROXICODONE) immediate release tablet 5-10 mg, 5-10 mg, Oral, Q4H PRN, Opyd, Ilene Qua, MD, 5 mg at 10/26/20 0022 .  pantoprazole (PROTONIX) EC tablet 40 mg, 40 mg, Oral, Daily, Pahwani, Ravi, MD, 40 mg at 10/25/20 1028 .  sodium chloride flush (NS) 0.9 % injection 3 mL, 3 mL, Intravenous, Q12H, Zada Finders R, MD, 3 mL at 10/25/20 2154   Patients Current Diet:     Diet Order                      Diet - low sodium heart healthy              Diet regular Room service appropriate? 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   Self Care: Did the patient need help bathing, dressing, using the toilet or eating?  Independent   Indoor Mobility: Did the patient need assistance with walking from room to room (with or without device)? Independent   Stairs: Did the patient need assistance with internal or external stairs (with or without device)? Independent   Functional Cognition: Did the patient need help planning regular tasks such as shopping or remembering to take medications? Independent   Home Assistive Devices / Equipment Home Equipment: None   Prior Device Use: Indicate devices/aids used by the patient prior to current illness, exacerbation or injury? None of the above   Current Functional Level Cognition   Arousal/Alertness: Awake/alert Overall Cognitive Status: Impaired/Different from baseline Current Attention Level: Sustained Orientation Level: Oriented X4 Following Commands: Follows one step commands with increased time,Follows multi-step commands inconsistently,Follows one step commands inconsistently Safety/Judgement: Decreased awareness of safety,Decreased awareness of deficits General Comments: Pt follows cues inconsistently and needs multi-modal cues and rewording or visual cues to get pt to appropriately follow directions. Apraxia noted as pt has poor motor planning with cues but can perform tasks that are more automatic and salient to his desires. Pt needing physical assistance to avoid obstacles, poor safety and deficits awareness. Easily distracted, needing quiet environment to follow  cues. Attention: Sustained Sustained Attention: Impaired Sustained Attention Impairment: Verbal complex Memory: Impaired Memory Impairment: Retrieval deficit,Decreased recall of new information Awareness: Impaired Awareness Impairment: Emergent impairment,Intellectual impairment Problem Solving: Impaired Problem Solving Impairment: Verbal basic,Functional basic Executive Function: Initiating Initiating: Impaired Initiating Impairment: Verbal basic,Functional basic Safety/Judgment: Impaired    Extremity Assessment (includes Sensation/Coordination)   Upper Extremity Assessment: RUE deficits/detail,LUE deficits/detail,Generalized weakness RUE Deficits / Details: generalized weakness, able to perform finger to thumb, supination/pronation RUE Sensation: WNL RUE Coordination:  (increased time) LUE Deficits / Details: generally 4/5 able to perform finger to thumb - harder on ring and pinky finger LUE Sensation: decreased proprioception LUE Coordination: decreased fine motor,decreased gross motor (Difficulty utilizing LUE functionally during ADLs.)  Lower Extremity Assessment: Defer to PT evaluation RLE Deficits / Details: MMT scores of 4 to 5 grossly throughout RLE Sensation: WNL (unable to assess dynamic proprioception due to poor attention span and following commands) RLE Coordination: decreased gross motor LLE Deficits / Details: MMT scores of 3+ to 4 grossly throughout LLE Sensation: WNL (unable to assess dynamic proprioception due to poor attention span and following commands) LLE Coordination: decreased fine motor,decreased gross motor (dysdiadochokinesia noted)     ADLs   Overall ADL's : Needs assistance/impaired Eating/Feeding: Moderate assistance,With caregiver independent assisting,Sitting Grooming: Sitting,Wash/dry hands,Minimal assistance Upper Body Bathing: Moderate assistance,Sitting Lower Body Bathing: Moderate assistance,Sitting/lateral leans Upper Body Dressing :  Moderate assistance,Sitting Lower Body Dressing: Maximal assistance,Sit to/from stand Toilet Transfer: Minimal assistance,Stand-pivot,BSC Toilet Transfer Details (indicate cue type and reason): one person HHA Toileting- Clothing Manipulation and Hygiene: Moderate assistance,+2 for safety/equipment,Sit to/from stand Toileting - Clothing Manipulation Details (indicate cue type and reason): Pt able to stand with OT and wife performing rear peri care Functional mobility during ADLs: Moderate assistance,Cueing for  sequencing (1 person HHA, SPT from bed>BSC>bed) General ADL Comments: Pt with deficits in coordination, cognition, vision, expressive language?, balance     Mobility   Overal bed mobility: Needs Assistance Bed Mobility: Supine to Sit Rolling: Min assist Sidelying to sit: Mod assist Supine to sit: HOB elevated,Mod assist Sit to supine: Min assist General bed mobility comments: ModA with HHA to pull trunk to ascend as pt unable to initiate with simple verbal cues and demonstration to copy PT. Needing assistance at legs to manage off bed.     Transfers   Overall transfer level: Needs assistance Equipment used: 1 person hand held assist Transfers: Sit to/from Stand Sit to Stand: Min assist Stand pivot transfers: Min assist General transfer comment: Extra time and repeated cues to scoot anteriorly with physical assistance for feet placement. Pt pulls up on RW rather than pushing up from bed, despite cues. MinA for steadying and extra time to process and complete.     Ambulation / Gait / Stairs / Wheelchair Mobility   Ambulation/Gait Ambulation/Gait assistance: Mod assist,Min assist Gait Distance (Feet): 225 Feet Assistive device: Rolling walker (2 wheeled) Gait Pattern/deviations: Step-through pattern,Decreased step length - left,Decreased stride length,Decreased dorsiflexion - left,Staggering left General Gait Details: Poor attention to L side with pt bumping obstacles on L and  drifting to L, especially as pt fatigued final ~70 ft. Decreased L step length and height, providing verbal and tactile cues to improve, fair carryover. Min-modA to manage RW and stabilize pt. Pt neglects L hand and needs cues to attend to it for proper replacement on RW on occasion. Improved midline alignment in RW this date initially, but increased drift to L with fatigue. Gait velocity: reduced Gait velocity interpretation: <1.8 ft/sec, indicate of risk for recurrent falls     Posture / Balance Dynamic Sitting Balance Sitting balance - Comments: No LOB sitting statically EOB, supervision for safety. Balance Overall balance assessment: Needs assistance Sitting-balance support: No upper extremity supported,Feet supported Sitting balance-Leahy Scale: Good Sitting balance - Comments: No LOB sitting statically EOB, supervision for safety. Standing balance support: Bilateral upper extremity supported,During functional activity Standing balance-Leahy Scale: Poor Standing balance comment: Bil UE support and external assist for safety.     Special needs/care consideration Designated visitor is sister, [name], who is a Therapist, sports. Wife, [name], returning to work , but would like communication of plan of care High Fall Risk Palliative care consulted for goals of care    Previous Home Environment  Living Arrangements: Spouse/significant other  Lives With: Spouse Available Help at Discharge: Family,Available 24 hours/day,Neighbor Type of Home: [place]: One level Home Access: Stairs to enter Entrance Stairs-Rails: None Entrance Stairs-Number of Steps: 2 Bathroom Shower/Tub: Chiropodist: Handicapped height Bathroom Accessibility: Yes How Accessible: Accessible via walker [place]: No Additional Comments: 2 dogs   Discharge Living Setting Plans for Discharge Living Setting: Patient's home,Lives with (comment) (wife) Type of Home at Discharge: House Discharge  Home Layout: One level Discharge Home Access: Stairs to enter Entrance Stairs-Rails: None Entrance Stairs-Number of Steps: 2 Discharge Bathroom Shower/Tub: Tub/shower unit Discharge Bathroom Toilet: Handicapped height Discharge Bathroom Accessibility: Yes How Accessible: Accessible via walker Does the patient have any problems obtaining your medications?: No   Social/Family/Support Systems Contact Information: wife, [name] Anticipated Caregiver: wife and pt's siblings Anticipated Caregiver's Contact Information: see above Ability/Limitations of Caregiver: wife works; pt's sister, [name] is an Therapist, sports for [employer], his siblings will asisst when wife works Building control surveyor Availability: 24/7 Discharge Plan  Discussed with Primary Caregiver: Yes Is Caregiver In Agreement with Plan?: Yes Does Caregiver/Family have Issues with Lodging/Transportation while Pt is in Rehab?: No   Goals Patient/Family Goal for Rehab: supervision PT, supervision to Min with OT and SLP Expected length of stay: ELOS 14 to 20 days Additional Information: had undergone first cycle of Chemo on 10/17/19 Pt/Family Agrees to Admission and willing to participate: Yes Program Orientation Provided & Reviewed with Pt/Caregiver Including Roles  & Responsibilities: Yes   Decrease burden of Care through IP rehab admission: n/a   Possible need for SNF placement upon discharge:not anticipated   Patient Condition: This patient's medical and functional status has changed since the consult dated: 10/23/2020 in which the Rehabilitation Physician determined and documented that the patient's condition is appropriate for intensive rehabilitative care in an inpatient rehabilitation facility. See "History of Present Illness" (above) for medical update. Functional changes are: mod assist overall. Patient's medical and functional status update has been discussed with the Rehabilitation physician and patient remains appropriate for inpatient rehabilitation.  Will admit to inpatient rehab today.   Preadmission Screen Completed By:  Cleatrice Burke, RN, 10/26/2020 11:18 AM ______________________________________________________________________   Discussed status with Dr. Dagoberto Ligas on 10/26/20 at  50 and received approval for admission today.   Admission Coordinator:  Cleatrice Burke, time 1100 Date 10/26/2020         Cosigned by: Courtney Heys, MD at 10/26/2020 12:08 PM    Revision History                                            Note Details  Author Cristina Gong, RN File Time 10/26/2020 11:44 AM  Author Type Rehab Admission Coordinator Status Addendum  Last Editor Cristina Gong, RN Service Physical Medicine and [place] # [id number] Admit Date 10/26/2020

## 2020-10-27 DIAGNOSIS — G8194 Hemiplegia, unspecified affecting left nondominant side: Secondary | ICD-10-CM

## 2020-10-27 DIAGNOSIS — D496 Neoplasm of unspecified behavior of brain: Secondary | ICD-10-CM

## 2020-10-27 LAB — COMPREHENSIVE METABOLIC PANEL
ALT: 100 U/L — ABNORMAL HIGH (ref 0–44)
AST: 34 U/L (ref 15–41)
Albumin: 3.2 g/dL — ABNORMAL LOW (ref 3.5–5.0)
Alkaline Phosphatase: 87 U/L (ref 38–126)
Anion gap: 11 (ref 5–15)
BUN: 19 mg/dL (ref 6–20)
CO2: 28 mmol/L (ref 22–32)
Calcium: 9.1 mg/dL (ref 8.9–10.3)
Chloride: 98 mmol/L (ref 98–111)
Creatinine, Ser: 0.75 mg/dL (ref 0.61–1.24)
GFR, Estimated: >60 mL/min (ref >60)
Glucose, Bld: 117 mg/dL — ABNORMAL HIGH (ref 70–99)
Potassium: 4.4 mmol/L (ref 3.5–5.1)
Sodium: 137 mmol/L (ref 135–145)
Total Bilirubin: 0.9 mg/dL (ref 0.3–1.2)
Total Protein: 6.3 g/dL — ABNORMAL LOW (ref 6.5–8.1)

## 2020-10-27 LAB — CBC WITH DIFFERENTIAL/PLATELET
Abs Immature Granulocytes: 1.58 10*3/uL — ABNORMAL HIGH (ref 0.00–0.07)
Basophils Absolute: 0 10*3/uL (ref 0.0–0.1)
Basophils Relative: 0 %
Eosinophils Absolute: 0 10*3/uL (ref 0.0–0.5)
Eosinophils Relative: 0 %
HCT: 41.2 % (ref 39.0–52.0)
Hemoglobin: 14.1 g/dL (ref 13.0–17.0)
Immature Granulocytes: 10 %
Lymphocytes Relative: 15 %
Lymphs Abs: 2.3 10*3/uL (ref 0.7–4.0)
MCH: 31.1 pg (ref 26.0–34.0)
MCHC: 34.2 g/dL (ref 30.0–36.0)
MCV: 90.7 fL (ref 80.0–100.0)
Monocytes Absolute: 1 10*3/uL (ref 0.1–1.0)
Monocytes Relative: 6 %
Neutro Abs: 10.4 10*3/uL — ABNORMAL HIGH (ref 1.7–7.7)
Neutrophils Relative %: 69 %
Platelets: 264 10*3/uL (ref 150–400)
RBC: 4.54 MIL/uL (ref 4.22–5.81)
RDW: 12.8 % (ref 11.5–15.5)
WBC: 15.3 10*3/uL — ABNORMAL HIGH (ref 4.0–10.5)
nRBC: 0 % (ref 0.0–0.2)

## 2020-10-27 LAB — LACTATE DEHYDROGENASE: LDH: 318 U/L — ABNORMAL HIGH (ref 98–192)

## 2020-10-27 NOTE — Evaluation (Signed)
Physical Therapy Assessment and Plan  Patient Details  Name: Sean [name] MRN: [id number] Date of Birth: December 30, 1965  PT Diagnosis: Difficulty walking, Hemiplegia non-dominant and Muscle weakness Rehab Potential: Fair ELOS: 2-3 Weeks   Today's Date: 10/27/2020 PT Individual Time: 4650-3546 PT Individual Time Calculation (min): 75 min    Hospital Problem: Principal Problem:   Brain tumor [facility]) Active Problems:   Malignant melanoma ([place])   Past Medical History:  Past Medical History:  Diagnosis Date  . Brain lesion 09/14/2020  . Hyperlipidemia   . Liver lesion 09/14/2020   Past Surgical History:  Past Surgical History:  Procedure Laterality Date  . APPLICATION OF CRANIAL NAVIGATION  09/29/2020   Procedure: APPLICATION OF CRANIAL NAVIGATION;  Surgeon: Judith Part, MD;  Location: [place];  Service: Neurosurgery;;  . Kyla Balzarine Left 09/29/2020   Procedure: Left craniotomy for tumor resection with brainlab;  Surgeon: Judith Part, MD;  Location: [place];  Service: Neurosurgery;  Laterality: Left;  . 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 Betsy Coder of oncology services and recently placed on dexamethasone with slow taper.  Per chart review lives with spouse.  1 level home 2 steps to entry.  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  Good family support.  Presented 10/20/2020 with right side weakness and aphasia as well as fatigue x4 days.  Admission chemistries unremarkable aside glucose 104, ALT 48, hemoglobin 15, urine culture multiple species, lactic acid 0.9.  CT of the head showed significant interval increase in number and size of numerous infratentorial and supratentorial  hemorrhagic metastasis with exuberant edema.  Resulting effacement of the right lateral ventricle.  Recent MRI of abdomen 09/15/2020 multifocal T1 hyperintense liver lesions, no kidney mass.  He underwent ultrasound-guided biopsy of a segment 4B liver lesion 09/18/2020 benign liver parenchyma with steatosis, no evidence of malignancy.  PET 09/25/2020 focal area of hypermetabolism in the right hilum, multiple areas of the liver, pelvic musculature, and left thigh-no CT correlate with any of these areas on the noncontrast CT, 10 mm anterior right upper lobe nodule with low-level hypermetabolism, additional tiny pulmonary nodules too small to characterize by PET.  Underwent resection of left occipital left frontal lesions 09/29/2020 pathology consistent with malignant melanoma.  MRI of the brain completed 10/24/2020 shows progression of metastatic melanoma with multiple new or increased size lesions.  Moderate vasogenic edema surrounding lesions in both frontal lobes right basal ganglia both occipital lobes and both parietal lobes.  New mass-effect on the right lateral ventricle without significant midline shift.  Oncology follow-up Dr. Alvy Bimler as well as follow-up Dr. Mickeal Skinner and remains on high-dose intravenous Decadron.  Keppra initiated for CVA prophylaxis.  Palliative care consulted to establish goals of care.  Patient transferred to CIR on 10/26/2020 .   Patient currently requires min with mobility secondary to muscle weakness, decreased cardiorespiratoy endurance, decreased coordination, decreased attention to left and decreased standing balance, hemiplegia and decreased balance strategies.  Prior to hospitalization, patient was independent  with mobility and lived with Spouse in a House home.  Home access is 2Stairs to enter.  Patient will benefit from skilled PT intervention to maximize safe functional mobility, minimize fall risk and decrease caregiver burden for planned discharge home with  24 hour supervision.   Anticipate patient will benefit from follow up [place] at discharge.  PT - End of Session Activity Tolerance: Tolerates 30+ min activity with multiple rests Endurance Deficit: Yes PT Assessment Rehab Potential (ACUTE/IP ONLY): Fair PT Barriers to Discharge: Pending chemo/radiation PT Patient demonstrates impairments in the following area(s): Balance;Behavior;Endurance;Motor;Pain;Perception;Safety;Sensory PT Transfers Functional Problem(s): Bed Mobility;Bed to Chair;Car;Furniture PT Locomotion Functional Problem(s): Ambulation;Stairs PT Plan PT Intensity: Minimum of 1-2 x/day ,45 to 90 minutes PT Frequency: 5 out of 7 days PT Duration Estimated Length of Stay: 2-3 Weeks PT Treatment/Interventions: Ambulation/gait training;Community reintegration;DME/adaptive equipment instruction;Neuromuscular re-education;Psychosocial support;Stair training;UE/LE Strength taining/ROM;Balance/vestibular training;Discharge planning;Functional electrical stimulation;Pain management;Skin care/wound management;Therapeutic Activities;UE/LE Coordination activities;Cognitive remediation/compensation;Disease management/prevention;Functional mobility training;Patient/family education;Therapeutic Exercise;Visual/perceptual remediation/compensation PT Transfers Anticipated Outcome(s): Supervision PT Locomotion Anticipated Outcome(s): Supervision PT Recommendation Follow Up Recommendations: 24 hour supervision/assistance;Home health PT Patient destination: Home Equipment Recommended: To be determined   PT Evaluation Precautions/Restrictions Precautions Precautions: Fall Precaution Comments: L hemiplegia Restrictions Weight Bearing Restrictions: No General Chart Reviewed: Yes Family/Caregiver Present: Yes  Home Living/Prior Functioning Home Living Available Help at Discharge: Family;Available 24 hours/day;Neighbor Type of Home: House Home Access: Stairs to enter [employer] of Steps: 2 Entrance  Stairs-Rails: None Home Layout: One level Bathroom Shower/Tub: Chiropodist: Handicapped height Bathroom Accessibility: Yes Additional Comments: 2 dogs  Lives With: Spouse Prior Function 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 Vision/Perception  Perception Perception: Impaired Inattention/Neglect: Does not attend to left side of body Praxis Praxis: Impaired  Cognition Overall Cognitive Status: Impaired/Different from baseline Arousal/Alertness: Awake/alert Orientation Level: Oriented X4 Attention: Sustained Sustained Attention: Impaired Memory: Impaired Memory Impairment: Retrieval deficit;Decreased recall of new information Immediate Memory Recall: Blue;Sock;Bed Memory Recall Sock: Not able to recall Memory Recall Blue: Not able to recall Memory Recall Bed: Not able to recall Awareness: Impaired Problem Solving: Impaired Executive Function: Initiating Initiating: Impaired Safety/Judgment: Impaired Sensation Sensation Light Touch: Appears Intact Coordination Gross Motor Movements are Fluid and Coordinated: No Fine Motor Movements are Fluid and Coordinated: No Coordination and Movement Description: decreased smoothness and accuracy Heel Shin Test: Very slow movements. R Leg seems slightly more coordinated than left. Motor  Motor Motor: Hemiplegia Motor - Skilled Clinical Observations: Left hemibody weaker than R  Trunk/Postural Assessment  Cervical Assessment Cervical Assessment: Within Functional Limits Thoracic Assessment Thoracic Assessment: Within Functional Limits Lumbar Assessment Lumbar Assessment: Within Functional Limits Postural Control Postural Control: Deficits on evaluation Protective Responses: Moves very slowly  Balance Balance Balance Assessed: Yes Standardized Balance Assessment Standardized Balance Assessment: Berg Balance Test Berg Balance Test Sit to Stand: Able to stand   independently using hands Standing Unsupported: Able to stand 30 seconds unsupported (sits down after 30 seconds) Sitting with Back Unsupported but Feet Supported on Floor or Stool: Able to sit safely and securely 2 minutes Stand to Sit: Controls descent by using hands Transfers: Able to transfer safely, definite need of hands Standing Unsupported with Eyes Closed: Unable to keep eyes closed 3 seconds but stays steady Standing Ubsupported with Feet Together: Able to place feet together independently but unable to hold for 30 seconds (appears to lose focus and resets feet) From Standing, Reach Forward with Outstretched Arm: Reaches forward but needs supervision (steps forward to reach target) From Standing Position, Pick up Object from Floor: Unable to try/needs assist to keep balance (refuses to attempt) From Standing Position, Turn to Look Behind Over each Shoulder: Turn sideways only but maintains balance Turn 360 Degrees: Needs close supervision or verbal cueing  Standing Unsupported, Alternately Place Feet on Step/Stool: Able to complete >2 steps/needs minimal assist (sits down in middle of task) Standing Unsupported, One Foot in Front: Loses balance while stepping or standing Standing on One Leg: Tries to lift leg/unable to hold 3 seconds but remains standing independently Total Score: 24 Static Sitting Balance Static Sitting - Balance Support: Feet supported Static Sitting - Level of Assistance: 5: Stand by assistance Dynamic Sitting Balance Dynamic Sitting - Balance Support: Feet supported Dynamic Sitting - Level of Assistance: 5: Stand by assistance Static Standing Balance Static Standing - Balance Support: During functional activity Static Standing - Level of Assistance: 4: Min assist Dynamic Standing Balance Dynamic Standing - Balance Support: During functional activity Dynamic Standing - Level of Assistance: 4: Min assist Extremity Assessment  RLE Assessment RLE Assessment:  Exceptions to [facility] General Strength Comments: Grossly 4+/5 LLE Assessment General Strength Comments: Grossly 3/5  Care Tool Care Tool Bed Mobility Roll left and right activity   Roll left and right assist level: Minimal Assistance - Patient > 75%    Sit to lying activity   Sit to lying assist level: Moderate Assistance - Patient 50 - 74%    Lying to sitting edge of bed activity   Lying to sitting edge of bed assist level: Moderate Assistance - Patient 50 - 74%     Care Tool Transfers Sit to stand transfer   Sit to stand assist level: Minimal Assistance - Patient > 75%    Chair/bed transfer   Chair/bed transfer assist level: Minimal Assistance - Patient > 75%     Toilet transfer   Assist Level: Minimal Assistance - Patient > 75%    Car transfer          Care Tool Locomotion Ambulation   Assist level: Minimal Assistance - Patient > 75% Assistive device: No Device Max distance: 150'  Walk 10 feet activity   Assist level: Minimal Assistance - Patient > 75% Assistive device: No Device   Walk 50 feet with 2 turns activity   Assist level: Minimal Assistance - Patient > 75% Assistive device: No Device  Walk 150 feet activity   Assist level: Minimal Assistance - Patient > 75% Assistive device: No Device  Walk 10 feet on uneven surfaces activity   Assist level: Minimal Assistance - Patient > 75% Assistive device:  (handrail)  Stairs   Assist level: Minimal Assistance - Patient > 75% Stairs assistive device: 2 hand rails Max number of stairs: 12  Walk up/down 1 step activity   Walk up/down 1 step (curb) assist level: Minimal Assistance - Patient > 75% Walk up/down 1 step or curb assistive device: 2 hand rails    Walk up/down 4 steps activity Walk up/down 4 steps assist level: Minimal Assistance - Patient > 75% Walk up/down 4 steps assistive device: 2 hand rails  Walk up/down 12 steps activity   Walk up/down 12 steps assist level: Minimal Assistance - Patient >  75% Walk up/down 12 steps assistive device: 2 hand rails  Pick up small objects from floor Pick up small object from the floor (from standing position) activity did not occur: Refused Pick up small object from the floor assist level:  (Pt refused while performing Berg)    Wheelchair Will patient use wheelchair at discharge?: No          Wheel 50 feet with 2 turns activity      Wheel 150 feet activity        Refer to Care  Plan for Long Term Goals  SHORT TERM GOAL WEEK 1 PT Short Term Goal 1 (Week 1): Pt will perform bed mobility with minA. PT Short Term Goal 2 (Week 1): Pt will perform bed to chair transfer with CGA. PT Short Term Goal 3 (Week 1): Pt will ambulate x150' with CGA. PT Short Term Goal 4 (Week 1): Pt will complete x4 steps with BHRs and CGA.  Recommendations for other services: Neuropsych  Skilled Therapeutic Intervention  Evaluation completed (see details above and below) with education on PT POC and goals and individual treatment initiated with focus on bed mobility, balance, ambulation, and stair training.   Pt received supine in bed and agrees to therapy. No report of pain. Supine to sit with modA and cues on body mechanics and sequencing. Pt requests to use restroom and PT cues for pt to stand. Pt remains sitting for several minutes prior to attempt at stand, despite several more cues to initiate transfer. Eventually pt stands with minA and ambulates to toilet with minA for toilet transfer. Pt stands at sink to wash hands with CGA. Following, WC transport to gym for time management. Pt performs car transfer and ramp navigation with minA and PT demo. Seated rest break prior to ambulating 150' with minA at hips. Pt demos shortened stance time on L and requires increased cueing during turns and navigating obstacles. Pt completes x12 6" steps with bilateral hand rails. Pt initially requires CGA but with fatigue, pt's L lower extremity requires manual assistance for  placement due to motor planning/coordination deficits. Pt performs Berg balance assessment, as detailed below, and has difficulty attending to task. Pt left seated in WC with alarm intact and all needs within reach.   Mobility Bed Mobility Bed Mobility: Supine to Sit;Sit to Supine Supine to Sit: Moderate Assistance - Patient 50-74% Sit to Supine: Moderate Assistance - Patient 50-74% Transfers Transfers: Sit to Stand;Stand Pivot Transfers;Stand to Sit Sit to Stand: Minimal Assistance - Patient > 75% Stand to Sit: Minimal Assistance - Patient > 75% Stand Pivot Transfers: Minimal Assistance - Patient > 75% Stand Pivot Transfer Details: Tactile cues for initiation;Verbal cues for sequencing;Verbal cues for technique Transfer (Assistive device): None Locomotion  Gait Ambulation: Yes Gait Assistance: Minimal Assistance - Patient > 75% Gait Distance (Feet): 150 Feet Assistive device: None Gait Assistance Details: Verbal cues for sequencing;Tactile cues for posture;Tactile cues for sequencing;Verbal cues for gait pattern Gait Gait: Yes Gait Pattern: Impaired Gait Pattern: Decreased stride length;Decreased stance time - left Gait velocity: reduced Stairs / Additional Locomotion Stairs: Yes Stairs Assistance: Minimal Assistance - Patient > 75% Stair Management Technique: Two rails Number of Stairs: 12 Height of Stairs: 6 Ramp: Minimal Assistance - Patient >75% Curb: Minimal Assistance - Patient >75% Wheelchair Mobility Wheelchair Mobility: No   Discharge Criteria: Patient will be discharged from PT if patient refuses treatment 3 consecutive times without medical reason, if treatment goals not met, if there is a change in medical status, if patient makes no progress towards goals or if patient is discharged from hospital.  The above assessment, treatment plan, treatment alternatives and goals were discussed and mutually agreed upon: by patient  Breck Coons, PT, DPT 10/27/2020,  3:45 PM

## 2020-10-27 NOTE — Progress Notes (Addendum)
HEMATOLOGY-ONCOLOGY PROGRESS NOTE  SUBJECTIVE: The patient was transitioned to CIR yesterday afternoon.  Working with speech therapy at the time my visit.  His sister is in his room and reports that he still has some periods of confusion although there are times where he is much clearer.  Oncology History  Melanoma of skin (Sean [name])  10/05/2020 Initial Diagnosis   Melanoma of skin (Sean [name])   10/05/2020 Cancer Staging   Staging form: Melanoma of the Skin, AJCC 8th Edition - Pathologic: Stage IV (pTX, pNX, pM1) - Signed by [name], MD on 10/05/2020   10/16/2020 -  Chemotherapy    Patient is on Treatment Plan: MELANOMA NIVOLUMAB + IPILIMUMAB (1/3) Q21D / NIVOLUMAB Q14D       PHYSICAL EXAMINATION:  Vitals:   10/27/20 0552 10/27/20 1420  BP: 119/75 117/79  Pulse: 69 95  Resp: 16 17  Temp: 98.3 F (36.8 C) 98.6 F (37 C)  SpO2: 96% 97%   Filed Weights   10/26/20 1518  Weight: 95 kg    Intake/Output from previous day: 02/10 0701 - 02/11 0700 In: 240 [P.O.:240] Out: 400 [Urine:400]  GENERAL: Awake and alert, periods of confusion SKIN: skin color, texture, turgor are normal, no rashes or significant lesions OROPHARYNX: No thrush ABDOMEN:abdomen soft, non-tender and normal bowel sounds NEURO: Arousable, oriented to place and year, follows simple commands, moves all extremities to command   LABORATORY DATA:  I have reviewed the data as listed CMP Latest Ref Rng & Units 10/27/2020 10/24/2020 10/22/2020  Glucose 70 - 99 mg/dL 117(H) 110(H) 167(H)  BUN 6 - 20 mg/dL 19 21(H) 19  Creatinine 0.61 - 1.24 mg/dL 0.75 0.69 0.77  Sodium 135 - 145 mmol/L 137 137 139  Potassium 3.5 - 5.1 mmol/L 4.4 3.4(L) 4.0  Chloride 98 - 111 mmol/L 98 102 105  CO2 22 - 32 mmol/L 28 23 23   Calcium 8.9 - 10.3 mg/dL 9.1 9.2 9.0  Total Protein 6.5 - 8.1 g/dL 6.3(L) - -  Total Bilirubin 0.3 - 1.2 mg/dL 0.9 - -  Alkaline Phos 38 - 126 U/L 87 - -  AST 15 - 41 U/L 34 - -  ALT 0 - 44 U/L 100(H) - -    Lab  Results  Component Value Date   WBC 15.3 (H) 10/27/2020   HGB 14.1 10/27/2020   HCT 41.2 10/27/2020   MCV 90.7 10/27/2020   PLT 264 10/27/2020   NEUTROABS 10.4 (H) 10/27/2020    DG Chest 2 View  Result Date: 10/20/2020 CLINICAL DATA:  Altered mental status EXAM: CHEST - 2 VIEW COMPARISON:  PET-CT September 25, 2020 FINDINGS: The heart size and mediastinal contours are within normal limits. Streaky left basilar opacity not significantly changed from prior favor atelectasis. Known right upper lobe pulmonary nodule not visualized on today's exam. No pleural effusion. No pneumothorax. The visualized skeletal structures are unremarkable. IMPRESSION: Streaky left basilar opacity, favor atelectasis though developing infection not excluded. Electronically Signed   By: Dahlia Bailiff MD   On: 10/20/2020 17:55   CT HEAD WO CONTRAST  Result Date: 10/25/2020 CLINICAL DATA:  Altered level of consciousness, metastatic melanoma EXAM: CT HEAD WITHOUT CONTRAST TECHNIQUE: Contiguous axial images were obtained from the base of the skull through the vertex without intravenous contrast. COMPARISON:  10/20/2020, 10/24/2020 FINDINGS: Brain: Unenhanced images of the brain again demonstrate numerous hemorrhagic metastases compatible with known metastatic melanoma. The size and number of lesions is unchanged. Diffuse areas of vasogenic edema are seen throughout the  bilateral cerebral hemispheres, with effacement of the lateral ventricles right greater than left. No significant midline shift. No acute infarct. No acute extra-axial fluid collections. Vascular: No hyperdense vessel or unexpected calcification. Skull: Normal. Negative for fracture or focal lesion. Stable postsurgical changes from prior left frontal and left occipital craniotomy. Sinuses/Orbits: No acute finding. Other: None. IMPRESSION: 1. Stable diffuse intracranial metastases compatible with metastatic melanoma. Stable vasogenic edema and effacement of the lateral  ventricles. 2. No significant change since recent MRI and head CT. Electronically Signed   By: Randa Ngo M.D.   On: 10/25/2020 22:56   CT Head Wo Contrast  Result Date: 10/20/2020 CLINICAL DATA:  Mental status change, unknown cause. EXAM: CT HEAD WITHOUT CONTRAST TECHNIQUE: Contiguous axial images were obtained from the base of the skull through the vertex without intravenous contrast. COMPARISON:  MRI head 09/30/20 FINDINGS: Brain: Interval increase in number and size of numerous supratentorial hemorrhagic metastases. The largest lesion on the left measures up to 5 point 1 cm in the left frontal lobe. The largest lesion on the right measures up to 3.3 cm in the right frontal lobe. Of note, there is a hemorrhagic lesion in the right thalamus. There is exuberant vasogenic edema associated with these lesions. No substantial midline shift. There is significant effacement of the right lateral ventricle. No hydrocephalus. Infratentorially, there is an 8 mm hemorrhagic metastasis within the superior right cerebellum. Vascular: No hyperdense vessel identified. Skull: No acute fracture. Prior left frontal and left parietal craniotomy. Sinuses/Orbits: No acute findings. Other: No mastoid effusions. IMPRESSION: Significant interval increase in number and size of numerous infratentorial and supratentorial hemorrhagic metastases with exuberant edema, as detailed above. Resulting effacement of the right lateral ventricle. An MRI with contrast could provide more complete evaluation if clinically indicated. Findings discussed with Dr. Gilford Raid Via telephone at 6:32 PM. Electronically Signed   By: Margaretha Sheffield MD   On: 10/20/2020 18:35   MR Brain W Wo Contrast  Result Date: 10/24/2020 CLINICAL DATA:  Metastatic melanoma EXAM: MRI HEAD WITHOUT AND WITH CONTRAST TECHNIQUE: Multiplanar, multiecho pulse sequences of the brain and surrounding structures were obtained without and with intravenous contrast. CONTRAST:  35mL  GADAVIST GADOBUTROL 1 MMOL/ML IV SOLN COMPARISON:  None. FINDINGS: Brain: There are numerous lesions throughout the brain, many of which are new and/or have increased in size. There are approximately 20 lesions. The largest lesions are in the left frontal operculum (3.4 x 3.0 cm, 11:15), right basal ganglia (3.5 x 2.5 cm, 11:13) and right occipital lobe (2.8 x 1.9 cm, 16:26). The lesions demonstrate intrinsic hyperintense T1-weighted signal. Beyond that, there is little demonstrated contrast enhancement. There is new mass effect on the right lateral ventricle without significant midline shift. There is moderate vasogenic edema surrounding lesions in both frontal lobes, the right basal ganglia, both occipital lobes and both parietal lobes. Vascular: Normal flow voids. Skull and upper cervical spine: Normal marrow signal. Remote left anterior and posterior craniotomies. Sinuses/Orbits: Negative. Other: None. IMPRESSION: 1. Progression of metastatic melanoma with multiple new or increased size lesions. 2. Moderate vasogenic edema surrounding lesions in both frontal lobes, right basal ganglia, both occipital lobes and both parietal lobes. 3. New mass effect on the right lateral ventricle without significant midline shift. Electronically Signed   By: Ulyses Jarred M.D.   On: 10/24/2020 03:10   MR BRAIN W WO CONTRAST  Result Date: 09/30/2020 CLINICAL DATA:  Status post craniotomy for tumor section. EXAM: MRI HEAD WITHOUT AND WITH CONTRAST TECHNIQUE:  Multiplanar, multiecho pulse sequences of the brain and surrounding structures were obtained without and with intravenous contrast. CONTRAST:  28mL GADAVIST GADOBUTROL 1 MMOL/ML IV SOLN COMPARISON:  Brain MRI 09/18/2020 FINDINGS: Brain: There are numerous diffusion restricting lesions throughout both hemispheres, which have associated hemorrhage and areas of contrast enhancement. New or increased sized lesions are as follows: 1. Right occipital lobe, 7 mm, previously 5 mm,  series 18, image 24 2. Right basal ganglia, 3 mm, series 18, image 31 3. Right thalamus, 8 mm, image 34, mild surrounding edema, intrinsic hyperintense T1-weighted signal 4. Left frontal operculum, enhancing component 5 mm surrounding area 17 mm, possibly previously present as a punctate lesion, image 35 5. Superior left occipital lobe, 14 mm, previously 10 mm, image 37, mild surrounding edema 6. Anterior right frontal lobe 3 mm, image 37, intrinsic hyperintense T1-weighted signal 7. Left frontal lobe, 6 mm, previously 4 mm, image 44, intrinsic hyperintense T1-weighted signal Unchanged or smaller lesions: 1. Superior right cerebellum, 3 mm, series 18, image 23 2. Dominant left occipital lesion has been resected. There is minimal residual contrast enhancement adjacent to the occipital horn of the left lateral ventricle, image 32, mild edema 3. Left frontal lobe lesion status post resection with no residual nodular contrast enhancement, image 37, mild edema 4. Superomedial right parietal lobe, 4 mm, image 42 5. Left parietal lobe, 3 mm, image 45 6. Punctate left parietal lobe, image 43 7. Paramedian superior right frontal lobe, 14 mm, image 49, mild edema with intrinsic hyperintense T1-weighted signal Vascular: Normal flow voids. Skull and upper cervical spine: Left frontal and parietal craniotomies. Sinuses/Orbits: Negative. Other: None. IMPRESSION: 1. Status post resection of left frontal and occipital lesions with minimal residual contrast enhancement near the occipital horn of the left lateral ventricle. 2. 7 lesions that are new or larger. 3. Remainder of lesions are unchanged or smaller. Electronically Signed   By: Ulyses Jarred M.D.   On: 09/30/2020 01:34     ASSESSMENT AND PLAN: 1. Multiple brain metastases, unknown primary tumor site  CT brain 09/14/2020-multiple hemorrhagic metastases with surrounding edema in the bilateral cerebral hemispheres  CTs chest, abdomen, pelvis 09/14/2020-1.4 cm right  upper lobe subpleural nodule, 0.5 cm right lower lobe nodule, multiple hypodensities in the liver suspicious for metastases, 1.5 cm right upper pole hypodense kidney lesion-indeterminate  MRI abdomen 09/15/2020-multifocal T1 hyperintense liver lesions, no kidney mass  Ultrasound-guided biopsy of a segment 4B liver lesion 09/18/2020-benign liver parenchyma with steatosis, no evidence of malignancy  PET 09/25/2020- focal area of hypermetabolism in the right hilum, multiple areas of the liver, pelvic musculature, and left thigh-no CT correlate with any of these areas on the noncontrast CT, 10 mm anterior right upper lobe nodule with low-level hypermetabolism, additional tiny pulmonary nodules too small to characterize by PET  SRS to 10 brain lesions 09/27/2020  Resection of left occipital and left frontal lesions on 09/29/2020-pathology consistent with malignant melanoma  Cycle 1 ipilimumab/nivolumab 10/16/2020  CT of the brain without contrast 10/26/2020 showed significant interval increase in the number and size of numerous metastases  Brain MRI 10/24/2020-multiple new and increased brain metastases, moderate edema surrounding multiple lesions, new mass-effect at the right lateral ventricle   2.  Left-sided weakness, new onset seizure secondary to #1, weakness has resolved 3.  Hyperlipidemia 4.  Elevated liver enzymes and bilirubin  Liver enzymes more elevated on repeat labs 09/27/2020, improved 09/28/2020 5.  Headache and nausea/vomiting secondary to #1, resolved 6.  Hospital admission 10/20/2020-encephalopathy  Mr.  [name] appears stable.  He continues to have intermittent confusion.  Extensive review of the brain MRI by neuroradiology, neuro-oncology, and radiation oncology is consistent with progression of untreated brain metastases.  Treated lesions have expected posttreatment changes.  The significant progression over several weeks is unusual.  It is possible the MRI findings are in part related  to hemorrhage or tumor flare from immunotherapy.  Case was reviewed with an outside oncologist who thinks changes could be related to a flare secondary to immunotherapy.  Would recommend nivolumab alone moving forward.  Recommend he continue dexamethasone 4 mg twice daily.  Neuro oncology will recommend a slow taper after the restaging evaluation next week..  Would like to get dexamethasone down to less than 2 mg/day prior to restarting immunotherapy.  The above recommendations were discussed with the patient's sister.  I have added on an LDH to this morning's labs.  Recommendations: 1. Continue dexamethasone 4 mg every 12 hours.  Taper per neuro-oncology. 2.  Continue therapy in CIR. 3.  Medical oncology will continue following him on the rehab unit.   LOS: 1 day   Mikey Bussing 10/27/20 Sean [name] was interviewed and examined.  He is more alert today.  He continues to have some left-sided neglect.  His vision has improved.  The plan is to continue Decadron at the current dose.  We will restage him next week and decide on tapering Decadron.  Please call oncology as needed over the weekend.  I will check on him 10/30/2020.  I was present for today's visit and performed medical decision making.

## 2020-10-27 NOTE — Progress Notes (Signed)
pts sister [name] would like pt to be taken off the scheduled Protonix, and make it PRN, she states pt does not have history of or complaining of any gastrointestinal disturbance or pain. She would like to keep him on only necessary medications to be continued as scheduled.   Dayna Ramus, LPN

## 2020-10-27 NOTE — Plan of Care (Signed)
Problem: RH Balance Goal: LTG: Patient will maintain dynamic sitting balance (OT) Description: LTG:  Patient will maintain dynamic sitting balance with assistance during activities of daily living (OT) Flowsheets (Taken 10/27/2020 1438) LTG: Pt will maintain dynamic sitting balance during ADLs with: Independent Goal: LTG Patient will maintain dynamic standing with ADLs (OT) Description: LTG:  Patient will maintain dynamic standing balance with assist during activities of daily living (OT)  Flowsheets (Taken 10/27/2020 1438) LTG: Pt will maintain dynamic standing balance during ADLs with: Supervision/Verbal cueing   Problem: Sit to Stand Goal: LTG:  Patient will perform sit to stand in prep for activites of daily living with assistance level (OT) Description: LTG:  Patient will perform sit to stand in prep for activites of daily living with assistance level (OT) Flowsheets (Taken 10/27/2020 1438) LTG: PT will perform sit to stand in prep for activites of daily living with assistance level: Supervision/Verbal cueing   Problem: RH Eating Goal: LTG Patient will perform eating w/assist, cues/equip (OT) Description: LTG: Patient will perform eating with assist, with/without cues using equipment (OT) Flowsheets (Taken 10/27/2020 1438) LTG: Pt will perform eating with assistance level of: Supervision/Verbal cueing   Problem: RH Grooming Goal: LTG Patient will perform grooming w/assist,cues/equip (OT) Description: LTG: Patient will perform grooming with assist, with/without cues using equipment (OT) Flowsheets (Taken 10/27/2020 1438) LTG: Pt will perform grooming with assistance level of: Supervision/Verbal cueing   Problem: RH Bathing Goal: LTG Patient will bathe all body parts with assist levels (OT) Description: LTG: Patient will bathe all body parts with assist levels (OT) Flowsheets (Taken 10/27/2020 1438) LTG: Pt will perform bathing with assistance level/cueing: Minimal Assistance - Patient  > 75%   Problem: RH Dressing Goal: LTG Patient will perform upper body dressing (OT) Description: LTG Patient will perform upper body dressing with assist, with/without cues (OT). Flowsheets (Taken 10/27/2020 1438) LTG: Pt will perform upper body dressing with assistance level of: Supervision/Verbal cueing Goal: LTG Patient will perform lower body dressing w/assist (OT) Description: LTG: Patient will perform lower body dressing with assist, with/without cues in positioning using equipment (OT) Flowsheets (Taken 10/27/2020 1438) LTG: Pt will perform lower body dressing with assistance level of: Minimal Assistance - Patient > 75%   Problem: RH Toileting Goal: LTG Patient will perform toileting task (3/3 steps) with assistance level (OT) Description: LTG: Patient will perform toileting task (3/3 steps) with assistance level (OT)  Flowsheets (Taken 10/27/2020 1438) LTG: Pt will perform toileting task (3/3 steps) with assistance level: Minimal Assistance - Patient > 75%   Problem: RH Functional Use of Upper Extremity Goal: LTG Patient will use RT/LT upper extremity as a (OT) Description: LTG: Patient will use right/left upper extremity as a stabilizer/gross assist/diminished/nondominant/dominant level with assist, with/without cues during functional activity (OT) Flowsheets (Taken 10/27/2020 1438) LTG: Use of upper extremity in functional activities: LUE as diminished level   Problem: RH Toilet Transfers Goal: LTG Patient will perform toilet transfers w/assist (OT) Description: LTG: Patient will perform toilet transfers with assist, with/without cues using equipment (OT) Flowsheets (Taken 10/27/2020 1438) LTG: Pt will perform toilet transfers with assistance level of: Supervision/Verbal cueing   Problem: RH Tub/Shower Transfers Goal: LTG Patient will perform tub/shower transfers w/assist (OT) Description: LTG: Patient will perform tub/shower transfers with assist, with/without cues using  equipment (OT) Flowsheets (Taken 10/27/2020 1438) LTG: Pt will perform tub/shower stall transfers with assistance level of: Supervision/Verbal cueing   Problem: RH Attention Goal: LTG Patient will demonstrate this level of attention during functional activites (OT)  Description: LTG:  Patient will demonstrate this level of attention during functional activites  (OT) Flowsheets (Taken 10/27/2020 1438) LTG: Patient will demonstrate this level of attention during functional activites (OT): Minimal Assistance - Patient > 75%   Problem: RH Awareness Goal: LTG: Patient will demonstrate awareness during functional activites type of (OT) Description: LTG: Patient will demonstrate awareness during functional activites type of (OT) Flowsheets (Taken 10/27/2020 1438) LTG: Patient will demonstrate awareness during functional activites type of (OT): Minimal Assistance - Patient > 75%

## 2020-10-27 NOTE — Progress Notes (Signed)
PROGRESS NOTE   Subjective/Complaints: Pt states he slept well. Denies headache or pain. Doesn't have a great appetite. Didn't offer much else  ROS: Patient denies fever, rash, sore throat, blurred vision, nausea, vomiting, diarrhea, cough, shortness of breath or chest pain, joint or back pain, headache, or mood change.   Objective:   CT HEAD WO CONTRAST  Result Date: 10/25/2020 CLINICAL DATA:  Altered level of consciousness, metastatic melanoma EXAM: CT HEAD WITHOUT CONTRAST TECHNIQUE: Contiguous axial images were obtained from the base of the skull through the vertex without intravenous contrast. COMPARISON:  10/20/2020, 10/24/2020 FINDINGS: Brain: Unenhanced images of the brain again demonstrate numerous hemorrhagic metastases compatible with known metastatic melanoma. The size and number of lesions is unchanged. Diffuse areas of vasogenic edema are seen throughout the bilateral cerebral hemispheres, with effacement of the lateral ventricles right greater than left. No significant midline shift. No acute infarct. No acute extra-axial fluid collections. Vascular: No hyperdense vessel or unexpected calcification. Skull: Normal. Negative for fracture or focal lesion. Stable postsurgical changes from prior left frontal and left occipital craniotomy. Sinuses/Orbits: No acute finding. Other: None. IMPRESSION: 1. Stable diffuse intracranial metastases compatible with metastatic melanoma. Stable vasogenic edema and effacement of the lateral ventricles. 2. No significant change since recent MRI and head CT. Electronically Signed   By: Randa Ngo M.D.   On: 10/25/2020 22:56   Recent Labs    10/27/20 0443  WBC 15.3*  HGB 14.1  HCT 41.2  PLT 264   Recent Labs    10/27/20 0443  NA 137  K 4.4  CL 98  CO2 28  GLUCOSE 117*  BUN 19  CREATININE 0.75  CALCIUM 9.1    Intake/Output Summary (Last 24 hours) at 10/27/2020 1005 Last data filed  at 10/27/2020 0700 Gross per 24 hour  Intake 240 ml  Output 400 ml  Net -160 ml        Physical Exam: Vital Signs Blood pressure 119/75, pulse 69, temperature 98.3 F (36.8 C), temperature source Oral, resp. rate 16, height 5\' 11"  (1.803 m), weight 95 kg, SpO2 96 %.  General: Alert,  No apparent distress HEENT: Head is normocephalic, atraumatic, PERRLA, EOMI, sclera anicteric, oral mucosa pink and moist, dentition intact, ext ear canals clear,  Neck: Supple without JVD or lymphadenopathy Heart: Reg rate and rhythm. No murmurs rubs or gallops Chest: CTA bilaterally without wheezes, rales, or rhonchi; no distress Abdomen: Soft, non-tender, non-distended, bowel sounds positive. Extremities: No clubbing, cyanosis, or edema. Pulses are 2+ Psych: Pt's affect is appropriate. Pt is cooperative Skin: Clean and intact, residual adhesive on scalp, NSL R antecub Neuro: No focal CN abnl. Oriented to person, hospital. Slow to process with impaired initiation and insight/awareness. Followed all basic commands. RUE and RLE grossly 5/5. LUE 3+ to 4/5 with apraxia, inattention. LLE 3 to 4/5 also with apraxia, inattention. Seems to sense LT and pain in all 4's.   Musculoskeletal: Full ROM, No pain with AROM or PROM in the neck, trunk, or extremities. Posture appropriate     Assessment/Plan: 1. Functional deficits which require 3+ hours per day of interdisciplinary therapy in a comprehensive inpatient rehab setting.  Physiatrist is providing  close team supervision and 24 hour management of active medical problems listed below.  Physiatrist and rehab team continue to assess barriers to discharge/monitor patient progress toward functional and medical goals  Care Tool:  Bathing    Body parts bathed by patient: Chest,Abdomen,Face   Body parts bathed by helper: Right arm,Left arm,Front perineal area,Buttocks,Right upper leg,Left upper leg,Right lower leg,Left lower leg     Bathing assist Assist  Level: Total Assistance - Patient < 25%     Upper Body Dressing/Undressing Upper body dressing   What is the patient wearing?: Pull over shirt    Upper body assist Assist Level: Total Assistance - Patient < 25%    Lower Body Dressing/Undressing Lower body dressing      What is the patient wearing?: Pants     Lower body assist Assist for lower body dressing: Total Assistance - Patient < 25%     Toileting Toileting    Toileting assist Assist for toileting: Maximal Assistance - Patient 25 - 49%     Transfers Chair/bed transfer  Transfers assist     Chair/bed transfer assist level: Minimal Assistance - Patient > 75%     Locomotion Ambulation   Ambulation assist              Walk 10 feet activity   Assist           Walk 50 feet activity   Assist           Walk 150 feet activity   Assist           Walk 10 feet on uneven surface  activity   Assist           Wheelchair     Assist               Wheelchair 50 feet with 2 turns activity    Assist            Wheelchair 150 feet activity     Assist          Blood pressure 119/75, pulse 69, temperature 98.3 F (36.8 C), temperature source Oral, resp. rate 16, height 5\' 11"  (4.315 m), weight 95 kg, SpO2 96 %.  Medical Problem List and Plan: 1.  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   -Follow-up oncology service Dr. Betsy Coder as well as Dr. Mickeal Skinner ongoing plan of care.  Continue Decadron 4 mg every 12 hours until further notice             -patient may  shower             -ELOS/Goals: min Assist - 14-18 days  -begin therapies today 2.  Antithrombotics: -DVT/anticoagulation: SCDs             -antiplatelet therapy: N/A 3. Pain Management: Tylenol  And oxycodone as needed for headaches  -pt denied any pain to me in front of his sister today  -he did not use any oxycodone last night  -observe  for now 4. Mood: Melatonin 5 mg nightly as needed sleep             -antipsychotic agents: N/A  -observe for depression. Affect very flat, but may be d/t location of mets 5. Neuropsych: This patient is capable of making decisions on his own behalf. 6. Skin/Wound Care: Routine skin checks 7. Fluids/Electrolytes/Nutrition: pt admit that he doesn't have a great appetite  -I personally reviewed the patient's labs today.    -  add protein supp for low albumin  -eating well at present (80-100%) 8.  Seizure prophylaxis.  Keppra 500 mg twice daily 9.  . Constipation- had bm last night 10. Leukocytosis: d/t steroids      LOS: 1 days A FACE TO FACE EVALUATION WAS PERFORMED  Meredith Staggers 10/27/2020, 10:05 AM

## 2020-10-27 NOTE — Evaluation (Signed)
Occupational Therapy Assessment and Plan  Patient Details  Name: Sean [name] MRN: [id number] Date of Birth: Nov 06, 1965  OT Diagnosis: apraxia, ataxia, cognitive deficits, hemiplegia affecting dominant side and muscle weakness (generalized) Rehab Potential: Rehab Potential (ACUTE ONLY): Good ELOS: 2-3 weeks   Today's Date: 10/27/2020 OT Individual Time: 6979-4801 OT Individual Time Calculation (min): 60 min     Hospital Problem: Principal Problem:   Brain tumor [facility]) Active Problems:   Malignant melanoma ([place])   Past Medical History:  Past Medical History:  Diagnosis Date  . Brain lesion 09/14/2020  . Hyperlipidemia   . Liver lesion 09/14/2020   Past Surgical History:  Past Surgical History:  Procedure Laterality Date  . APPLICATION OF CRANIAL NAVIGATION  09/29/2020   Procedure: APPLICATION OF CRANIAL NAVIGATION;  Surgeon: Judith Part, MD;  Location: [facility];  Service: Neurosurgery;;  . Kyla Balzarine Left 09/29/2020   Procedure: Left craniotomy for tumor resection with brainlab;  Surgeon: Judith Part, MD;  Location: [facility];  Service: Neurosurgery;  Laterality: Left;  . HERNIA REPAIR     left side inguinal hernia about 18-20 years    Assessment & Plan Clinical Impression: Patient is a 55 y.o. 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 Betsy Coder of oncology services and recently placed on dexamethasone with slow taper.  Per chart review lives with spouse.  1 level home 2 steps to entry.  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  Good family support.  Presented 10/20/2020 with right side weakness and aphasia as well as fatigue x4 days.  Admission chemistries unremarkable aside glucose 104, ALT 48, hemoglobin 15, urine culture multiple species, lactic acid 0.9.  CT of the head showed significant interval increase in  number and size of numerous infratentorial and supratentorial hemorrhagic metastasis with exuberant edema.  Resulting effacement of the right lateral ventricle.  Recent MRI of abdomen 09/15/2020 multifocal T1 hyperintense liver lesions, no kidney mass.  He underwent ultrasound-guided biopsy of a segment 4B liver lesion 09/18/2020 benign liver parenchyma with steatosis, no evidence of malignancy.  PET 09/25/2020 focal area of hypermetabolism in the right hilum, multiple areas of the liver, pelvic musculature, and left thigh-no CT correlate with any of these areas on the noncontrast CT, 10 mm anterior right upper lobe nodule with low-level hypermetabolism, additional tiny pulmonary nodules too small to characterize by PET.  Underwent resection of left occipital left frontal lesions 09/29/2020 pathology consistent with malignant melanoma.  MRI of the brain completed 10/24/2020 shows progression of metastatic melanoma with multiple new or increased size lesions.  Moderate vasogenic edema surrounding lesions in both frontal lobes right basal ganglia both occipital lobes and both parietal lobes.  New mass-effect on the right lateral ventricle without significant midline shift.  Oncology follow-up Dr. Alvy Bimler as well as follow-up Dr. Mickeal Skinner and remains on high-dose intravenous Decadron.  Keppra initiated for CVA prophylaxis.  Palliative care consulted to establish goals of care.  Patient tolerating a regular consistency diet.  Therapy evaluations completed due to decline in ADLs and mobility patient was admitted for a comprehensive rehab program.  Patient transferred to CIR on 10/26/2020 .    Patient currently requires max/total A with basic self-care skills secondary to muscle weakness, decreased cardiorespiratoy endurance, impaired timing and sequencing, abnormal tone, unbalanced muscle activation, motor apraxia, ataxia, decreased coordination and decreased motor planning, decreased midline orientation, decreased attention  to  left, decreased motor planning and ideational apraxia, decreased initiation, decreased attention, decreased awareness, decreased problem solving, decreased safety awareness, decreased memory and delayed processing and decreased sitting balance, decreased standing balance, decreased postural control, hemiplegia and decreased balance strategies.  Prior to hospitalization, patient could complete BADL with independent .  Patient will benefit from skilled intervention to increase independence with basic self-care skills prior to discharge home with care partner.  Anticipate patient will require 24 hour supervision and minimal physical assistance and follow up home health.  OT - End of Session Endurance Deficit: Yes Endurance Deficit Description: Rest breaks within BADL tasks OT Assessment Rehab Potential (ACUTE ONLY): Good OT Patient demonstrates impairments in the following area(s): Balance;Behavior;Cognition;Endurance;Nutrition;Motor;Perception;Safety;Skin Integrity;Sensory;Vision OT Basic ADL's Functional Problem(s): Grooming;Eating;Bathing;Dressing;Toileting OT Transfers Functional Problem(s): Toilet;Tub/Shower OT Additional Impairment(s): Fuctional Use of Upper Extremity OT Plan OT Intensity: Minimum of 1-2 x/day, 45 to 90 minutes OT Frequency: 5 out of 7 days OT Duration/Estimated Length of Stay: 2-3 weeks OT Treatment/Interventions: Balance/vestibular training;Cognitive remediation/compensation;Community reintegration;Disease mangement/prevention;Discharge planning;DME/adaptive equipment instruction;Functional electrical stimulation;Functional mobility training;Neuromuscular re-education;Pain management;Patient/family education;Psychosocial support;Self Care/advanced ADL retraining;Splinting/orthotics;Skin care/wound managment;Therapeutic Activities;UE/LE Strength taining/ROM;Therapeutic Exercise;UE/LE Coordination activities;Visual/perceptual remediation/compensation;Wheelchair  propulsion/positioning OT Self Feeding Anticipated Outcome(s): Supervision OT Basic Self-Care Anticipated Outcome(s): Min A/supervision OT Toileting Anticipated Outcome(s): Min A OT Bathroom Transfers Anticipated Outcome(s): Supervision OT Recommendation Recommendations for Other Services: Neuropsych consult Patient destination: Home Follow Up Recommendations: Home health OT (vs OPOT) Equipment Recommended: To be determined   OT Evaluation Precautions/Restrictions  Precautions Precautions: Fall Precaution Comments: L hemiplegia Restrictions Weight Bearing Restrictions: No Pain  denies pain Home Living/Prior Functioning Home Living Family/patient expects to be discharged to:: Private residence Living Arrangements: Spouse/significant other Available Help at Discharge: Family,Available 24 hours/day,Neighbor Type of Home: House Home Access: Stairs to enter [employer] of Steps: 2 Entrance Stairs-Rails: None Home Layout: One level Bathroom Shower/Tub: Chiropodist: Handicapped height Bathroom Accessibility: Yes Additional Comments: 2 dogs  Lives With: Spouse IADL History Occupation: Full time employment Prior Function Vocation: Full time employment Comments: Building control surveyor?: Vision impaired- to be further tested in functional context Eye Alignment: Within Functional Limits Ocular Range of Motion: Within Functional Limits (able to track within all quadrants) Alignment/Gaze Preference: Within Defined Limits Tracking/Visual Pursuits: Able to track stimulus in all quads without difficulty Perception  Perception: Impaired Inattention/Neglect: Does not attend to left side of body Praxis Praxis: Impaired Cognition Overall Cognitive Status: Impaired/Different from baseline Arousal/Alertness: Awake/alert Orientation Level: Person Year: 2022 Month: February Day of Week: Incorrect Memory:  Impaired Memory Impairment: Decreased recall of new information;Storage deficit Immediate Memory Recall: Blue;Sock;Bed Memory Recall Sock: Not able to recall Memory Recall Blue: Not able to recall Memory Recall Bed: Not able to recall Attention: Sustained Sustained Attention: Impaired Sustained Attention Impairment: Verbal basic;Functional basic Awareness: Impaired Awareness Impairment: Intellectual impairment Problem Solving: Impaired Problem Solving Impairment: Verbal basic;Functional basic Executive Function:  (all impaired due to lower level deficits) Initiating: Impaired Initiating Impairment: Verbal basic;Functional basic Safety/Judgment: Impaired Sensation Sensation Light Touch: Appears Intact Coordination Gross Motor Movements are Fluid and Coordinated: No Fine Motor Movements are Fluid and Coordinated: No Coordination and Movement Description: decreased smoothness and accuracy Heel Shin Test: Very slow movements. R Leg seems slightly more coordinated than left. Motor  Motor Motor: Hemiplegia Motor - Skilled Clinical Observations: Left hemibody weaker than R  Trunk/Postural Assessment  Cervical Assessment Cervical Assessment: Within Functional Limits Thoracic Assessment Thoracic Assessment: Within Functional Limits Lumbar Assessment Lumbar Assessment: Within  Functional Limits Postural Control Postural Control: Deficits on evaluation Protective Responses: Moves very slowly  Balance Balance Balance Assessed: Yes Static Sitting Balance Static Sitting - Balance Support: Feet supported Static Sitting - Level of Assistance: 5: Stand by assistance Dynamic Sitting Balance Dynamic Sitting - Balance Support: Feet supported Dynamic Sitting - Level of Assistance: 5: Stand by assistance Static Standing Balance Static Standing - Balance Support: During functional activity Static Standing - Level of Assistance: 4: Min assist Dynamic Standing Balance Dynamic Standing -  Balance Support: During functional activity Dynamic Standing - Level of Assistance: 4: Min assist Extremity/Trunk Assessment RUE Assessment RUE Assessment: Within Functional Limits LUE Assessment LUE Assessment: Exceptions to [facility] LUE Strength LUE Overall Strength Comments: 2+/5 overall Left Shoulder Flexion: 2+/5  Care Tool Care Tool Self Care Eating   Eating Assist Level: Total Assistance - Patient < 25%    Oral Care    Oral Care Assist Level: Total assistance - Patient < 25%    Bathing   Body parts bathed by patient: Chest;Abdomen;Face Body parts bathed by helper: Right arm;Left arm;Front perineal area;Buttocks;Right upper leg;Left upper leg;Right lower leg;Left lower leg   Assist Level: Total Assistance - Patient < 25%    Upper Body Dressing(including orthotics)   What is the patient wearing?: Pull over shirt   Assist Level: Total Assistance - Patient < 25%    Lower Body Dressing (excluding footwear)   What is the patient wearing?: Pants Assist for lower body dressing: Total Assistance - Patient < 25%    Putting on/Taking off footwear   What is the patient wearing?: Non-skid slipper socks Assist for footwear: Dependent - Patient 0%       Care Tool Toileting Toileting activity   Assist for toileting: Maximal Assistance - Patient 25 - 49%     Care Tool Bed Mobility Roll left and right activity   Roll left and right assist level: Minimal Assistance - Patient > 75%    Sit to lying activity Sit to lying activity did not occur: N/A (asleep) Sit to lying assist level: Moderate Assistance - Patient 50 - 74%    Lying to sitting edge of bed activity Lying to sitting edge of bed activity did not occur: N/A (asleep) Lying to sitting edge of bed assist level: Moderate Assistance - Patient 50 - 74%     Care Tool Transfers Sit to stand transfer   Sit to stand assist level: Minimal Assistance - Patient > 75%    Chair/bed transfer   Chair/bed transfer assist level:  Minimal Assistance - Patient > 75%     Toilet transfer   Assist Level: Minimal Assistance - Patient > 75%     Care Tool Cognition Expression of Ideas and Wants Expression of Ideas and Wants: Frequent difficulty - frequently exhibits difficulty with expressing needs and ideas   Understanding Verbal and Non-Verbal Content Understanding Verbal and Non-Verbal Content: Sometimes understands - understands only basic conversations or simple, direct phrases. Frequently requires cues to understand   Memory/Recall Ability *first 3 days only Memory/Recall Ability *first 3 days only: Current season;That he or she is in a hospital/hospital unit    Refer to Care Plan for [place] 1 OT Short Term Goal 1 (Week 1): Pt will complete sit<>stand out of shower with Mod cues OT Short Term Goal 2 (Week 1): Pt will complete 1 step of UB dressing task with mod questioinong cues OT Short Term Goal 3 (Week 1): Pt will  complete 1 step of tolieting task  Recommendations for other services: Neuropsych   Skilled Therapeutic Intervention  Pt greeted semi-reclined in bed awake and agreeable to OT eval and treat. Pt completed bed mobility with increased time to process commands and min A. Pt then needed min A to stand, but again slow to process sit<>stand. Pt said he did not want to bathe or go to the bathroom, then when OT brought him to the sink in wc, he impulsively stood and ambulated to the bathroom with min A. Pt voided bowel and bladder. He needed max A for peri-care 2/2 not remembering he needed to wipe and needed max cues for thoroughness. Pt then impulsively ambulated into the shower. OT covered IV site with waterproof dressing. Pt needed MAX multimodal cues and max A for bathing with poor initation and easily distracted by shower environment. After shower, pt would not stand up to get out of shower. Pt sat there and said " I will in a minute" for 15 minutes as OT tried different  techniques to get him to initiate stand. If OT tried to facilitate a stand with tactile cues, he would resist and become agitated. After over 15 minutes, pt finally stood up and needed mod A to pivot hips over to w/c 2/2 motor planning. Max A for dressing tasks 2/2 initiation, motor planning, slow processing, and L hemiplegia. Pt then would not initiate stand to pull up pants, but eventually was able to get him to stand to pivot back to bed and OT pulled his pants up during pivot. Pt left semi-reclined in bed in quet environment with bed alarm on, call bell in reach, and needs met.  ADL ADL Eating: Maximal assistance;Maximal cueing Grooming: Maximal assistance;Maximal cueing Upper Body Bathing: Maximal assistance;Maximal cueing Lower Body Bathing: Maximal assistance;Maximal cueing Upper Body Dressing: Maximal assistance;Maximal cueing Lower Body Dressing: Maximal assistance;Maximal cueing Toilet Transfer: Minimal assistance Walk-In Shower Transfer: Minimal assistance Mobility  Bed Mobility Bed Mobility: Supine to Sit;Sit to Supine Supine to Sit: Moderate Assistance - Patient 50-74% Sit to Supine: Moderate Assistance - Patient 50-74% Transfers Sit to Stand: Minimal Assistance - Patient > 75% Stand to Sit: Minimal Assistance - Patient > 75%   Discharge Criteria: Patient will be discharged from OT if patient refuses treatment 3 consecutive times without medical reason, if treatment goals not met, if there is a change in medical status, if patient makes no progress towards goals or if patient is discharged from hospital.  The above assessment, treatment plan, treatment alternatives and goals were discussed and mutually agreed upon: by patient  Valma Cava 10/27/2020, 4:00 PM

## 2020-10-27 NOTE — Evaluation (Signed)
Speech Language Pathology Assessment and Plan  Patient Details  Name: Sean [name] MRN: [id number] Date of Birth: [birth year]  SLP Diagnosis: Cognitive Impairments  Rehab Potential: Excellent ELOS: 2-3 weeks    Today's Date: 10/27/2020 SLP Individual Time: 1300-1400 SLP Individual Time Calculation (min): 60 min   Hospital Problem: Principal Problem:   Brain tumor [facility]) Active Problems:   Malignant melanoma ([place])  Past Medical History:  Past Medical History:  Diagnosis Date  . Brain lesion 09/14/2020  . Hyperlipidemia   . Liver lesion 09/14/2020   Past Surgical History:  Past Surgical History:  Procedure Laterality Date  . APPLICATION OF CRANIAL NAVIGATION  09/29/2020   Procedure: APPLICATION OF CRANIAL NAVIGATION;  Surgeon: Judith Part, MD;  Location: [place];  Service: Neurosurgery;;  . Kyla Balzarine Left 09/29/2020   Procedure: Left craniotomy for tumor resection with brainlab;  Surgeon: Judith Part, MD;  Location: [place];  Service: Neurosurgery;  Laterality: Left;  . 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 Betsy Coder of oncology services and recently placed on dexamethasone with slow taper.  Per chart review lives with spouse.  1 level home 2 steps to entry.  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  Good family support.  Presented 10/20/2020 with right side weakness and aphasia as well as fatigue x4 days.  Admission chemistries unremarkable aside glucose 104, ALT 48, hemoglobin 15, urine culture multiple species, lactic acid 0.9.  CT of the head showed significant interval increase in number and size of numerous infratentorial and supratentorial hemorrhagic  metastasis with exuberant edema.  Resulting effacement of the right lateral ventricle. Recent MRI of abdomen 09/15/2020 multifocal T1 hyperintense liver lesions, no kidney mass.  He underwent ultrasound-guided biopsy of a segment 4B liver lesion 09/18/2020 benign liver parenchyma with steatosis, no evidence of malignancy.  PET 09/25/2020 focal area of hypermetabolism in the right hilum, multiple areas of the liver, pelvic musculature, and left thigh-no CT correlate with any of these areas on the noncontrast CT, 10 mm anterior right upper lobe nodule with low-level hypermetabolism, additional tiny pulmonary nodules too small to characterize by PET.  Underwent resection of left occipital left frontal lesions 09/29/2020 pathology consistent with malignant melanoma.  MRI of the brain completed 10/24/2020 shows progression of metastatic melanoma with multiple new or increased size lesions.  Moderate vasogenic edema surrounding lesions in both frontal lobes right basal ganglia both occipital lobes and both parietal lobes.  New mass-effect on the right lateral ventricle without significant midline shift.  Oncology follow-up Dr. Alvy Bimler as well as follow-up Dr. Mickeal Skinner and remains on high-dose intravenous Decadron.  Keppra initiated for CVA prophylaxis. Palliative care consulted to establish goals of care.  Patient tolerating a regular consistency diet. Therapy evaluations completed due to decline in ADLs and mobility patient was admitted for a comprehensive rehab program 10/26/20.   Patient demonstrates severe cognitive impairments impacting initiation of functional tasks and verbal expression, orientation, sustained attention, functional problem solving, recall and awareness. Patient was both internally and externally distracted throughout evaluation which significantly impacted his overall performance on the cognitive assessment.  Patient also demonstrates left inattention and visual-perceptual deficits which impacts his  safety with functional and familiar tasks. Patient was administered the  Cognistat and scored severe impairments in reasoning, judgement and naming as well as moderate impairments in attention and memory. Patient's overall expressive and receptive language appeared [facility] and deficits appear cognitive based in nature.  Patient would benefit from skilled SLP intervention to maximize his cognitive functioning prior to discharge.    Skilled Therapeutic Interventions          Administered a cognitive-linguistic evaluation, please see above for details. Educated patient and his sister regarding current cognitive deficits and strategies to reduce distractions, especially at meal times. Patient's sister verbalized understanding.    SLP Assessment  Patient will need skilled Speech Lanaguage Pathology Services during CIR admission    Recommendations  Compensations: Minimize environmental distractions Oral Care Recommendations: Oral care BID Recommendations for Other Services: Neuropsych consult Patient destination: Home Follow up Recommendations: 24 hour supervision/assistance;Home Health SLP Equipment Recommended: None recommended by SLP    SLP Frequency 3 to 5 out of 7 days   SLP Duration  SLP Intensity  SLP Treatment/Interventions 2-3 weeks  Minumum of 1-2 x/day, 30 to 90 minutes  Cognitive remediation/compensation;Internal/external aids;Cueing hierarchy;Environmental controls;Therapeutic Activities;Functional tasks;Patient/family education    Pain No/Denies Pain   Prior Functioning Type of Home: House  Lives With: Spouse Available Help at Discharge: Family;Available 24 hours/day;Neighbor Vocation: Full time employment  SLP Evaluation Cognition Overall Cognitive Status: Impaired/Different from baseline Arousal/Alertness: Awake/alert Orientation Level: Oriented to person;Oriented to situation;Oriented to time;Disoriented to place Attention: Sustained Sustained Attention:  Impaired Sustained Attention Impairment: Verbal basic;Functional basic Memory: Impaired Memory Impairment: Decreased recall of new information;Storage deficit Immediate Memory Recall: Blue;Sock;Bed Memory Recall Sock: Not able to recall Memory Recall Blue: Not able to recall Memory Recall Bed: Not able to recall Awareness: Impaired Awareness Impairment: Intellectual impairment Problem Solving: Impaired Problem Solving Impairment: Verbal basic;Functional basic Executive Function:  (all impaired due to lower level deficits) Initiating: Impaired Safety/Judgment: Impaired  Comprehension Auditory Comprehension Overall Auditory Comprehension: Impaired Yes/No Questions: Impaired Basic Immediate Environment Questions: 75-100% accurate Complex Questions: 0-24% accurate One Step Basic Commands: 50-74% accurate Conversation: Simple Interfering Components: Attention;Processing speed Visual Recognition/Discrimination Discrimination: Not tested Reading Comprehension Reading Status: Not tested Expression Expression Primary Mode of Expression: Verbal Verbal Expression Overall Verbal Expression: Impaired Initiation: Impaired Automatic Speech: Name;Social Response Level of Generative/Spontaneous Verbalization: Word Pragmatics: Impairment Impairments: Abnormal affect;Eye contact;Turn Taking Written Expression Dominant Hand: Left Written Expression: Unable to assess (comment)  Care Tool Care Tool Cognition Expression of Ideas and Wants Expression of Ideas and Wants: Frequent difficulty - frequently exhibits difficulty with expressing needs and ideas   Understanding Verbal and Non-Verbal Content Understanding Verbal and Non-Verbal Content: Sometimes understands - understands only basic conversations or simple, direct phrases. Frequently requires cues to understand   Memory/Recall Ability *first 3 days only Memory/Recall Ability *first 3 days only: Current season;That he or she is in a  hospital/hospital unit     Short Term Goals: Week 1: SLP Short Term Goal 1 (Week 1): Patient will initaite functional tasks with Mod A multimodal cues in 50% of opportunities SLP Short Term Goal 2 (Week 1): Patient will verbally respond to questions in 50% of opportunities with Mod A multimodal cues. SLP Short Term Goal 3 (Week 1): Patient will demonstrate orientation to place, time and situation with Mod A multimodal cues. SLP Short Term Goal 4 (Week 1): Patient will demonstrate sustained attention to functional tasks for 5 minutes with Mod A multimodal cues for redirection. SLP Short Term Goal 5 (Week 1): Patient will demonstrate functional problem solving for basic  and familiar tasks with Mod A multimodal cues.  Refer to Care Plan for Long Term Goals  Recommendations for other services: Neuropsych  Discharge Criteria: Patient will be discharged from SLP if patient refuses treatment 3 consecutive times without medical reason, if treatment goals not met, if there is a change in medical status, if patient makes no progress towards goals or if patient is discharged from hospital.  The above assessment, treatment plan, treatment alternatives and goals were discussed and mutually agreed upon: by patient and by family  Quantina Dershem 10/27/2020, 3:54 PM

## 2020-10-27 NOTE — IPOC Note (Signed)
Overall Plan of Care [facility]) Patient Details Name: Sean [name] MRN: [id number] DOB: May 07, 1966  Admitting Diagnosis: Brain tumor [facility])  Hospital Problems: Principal Problem:   Brain tumor [facility]) Active Problems:   Malignant melanoma ([place])     Functional Problem List: Nursing Behavior,Bladder,Bowel,Edema,Endurance,Medication Management,Nutrition,Pain,Perception,Safety,Skin Integrity  PT Balance,Behavior,Endurance,Motor,Pain,Perception,Safety,Sensory  OT Balance,Behavior,Cognition,Endurance,Nutrition,Motor,Perception,Safety,Skin Integrity,Sensory,Vision  SLP Cognition  TR         Basic ADL's: OT Grooming,Eating,Bathing,Dressing,Toileting     Advanced  ADL's: OT       Transfers: PT Bed Mobility,Bed to Chair,Car,Furniture  OT Toilet,Tub/Shower     Locomotion: PT Ambulation,Stairs     Additional Impairments: OT Fuctional Use of Upper Extremity  SLP Social Cognition   Social Interaction,Problem Solving,Memory,Attention,Awareness  TR      Anticipated Outcomes Item Anticipated Outcome  Self Feeding Supervision  Swallowing      Basic self-care  Min A/supervision  Toileting  Min A   Bathroom Transfers Supervision  Bowel/Bladder  Supervision/min assist  Transfers  Supervision  Locomotion  Supervision  Communication     Cognition  Min A  Pain  <4  Safety/Judgment  Supervision   Therapy Plan: PT Intensity: Minimum of 1-2 x/day ,45 to 90 minutes PT Frequency: 5 out of 7 days PT Duration Estimated Length of Stay: 2-3 Weeks OT Intensity: Minimum of 1-2 x/day, 45 to 90 minutes OT Frequency: 5 out of 7 days OT Duration/Estimated Length of Stay: 2-3 weeks SLP Intensity: Minumum of 1-2 x/day, 30 to 90 minutes SLP Frequency: 3 to 5 out of 7 days SLP Duration/Estimated Length of Stay: 2-3 weeks   Due to the current state of emergency, patients may not be receiving their 3-hours of Medicare-mandated therapy.   Team Interventions: Nursing Interventions  Patient/Family Education,Bladder Management,Bowel Management,Disease Management/Prevention,Pain Management,Medication Management,Skin Care/Wound Management,Discharge Planning,Psychosocial Support  PT interventions Ambulation/gait training,Community Corporate treasurer re-education,Psychosocial support,Stair training,UE/LE Strength taining/ROM,Balance/vestibular training,Discharge planning,Functional electrical stimulation,Pain management,Skin care/wound management,Therapeutic Activities,UE/LE Coordination activities,Cognitive remediation/compensation,Disease management/prevention,Functional mobility training,Patient/family education,Therapeutic Exercise,Visual/perceptual remediation/compensation  OT Interventions Balance/vestibular training,Cognitive remediation/compensation,Community reintegration,Disease mangement/prevention,Discharge planning,DME/adaptive equipment instruction,Functional electrical stimulation,Functional mobility training,Neuromuscular re-education,Pain management,Patient/family education,Psychosocial support,Self Care/advanced ADL retraining,Splinting/orthotics,Skin care/wound managment,Therapeutic Activities,UE/LE Strength taining/ROM,Therapeutic Exercise,UE/LE Coordination activities,Visual/perceptual remediation/compensation,Wheelchair propulsion/positioning  SLP Interventions Cognitive remediation/compensation,Internal/external aids,Cueing hierarchy,Environmental controls,Therapeutic Activities,Functional tasks,Patient/family education  TR Interventions    SW/CM Interventions Discharge Planning,Psychosocial Support,Patient/Family Education   Barriers to Discharge MD  Medical stability  Nursing Inaccessible home environment,Decreased caregiver support,Home environment access/layout,Incontinence,Wound Care,Lack of/limited family support,Medication compliance,Pending chemo/radiation,Behavior    PT Pending chemo/radiation    OT      SLP       SW       Team Discharge Planning: Destination: PT-Home ,OT- Home , SLP-Home Projected Follow-up: PT-24 hour supervision/assistance,Home health PT, OT-  Home health OT (vs OPOT), SLP-24 hour supervision/assistance,Home Health SLP Projected Equipment Needs: PT-To be determined, OT- To be determined, SLP-None recommended by SLP Equipment Details: PT- , OT-  Patient/family involved in discharge planning: PT- Patient,Family member/caregiver,  OT-Patient,Family member/caregiver, SLP-Patient,Family member/caregiver  MD ELOS: 2-3 weeks Medical Rehab Prognosis:  Excellent Assessment: The patient has been admitted for CIR therapies with the diagnosis of metastatic melanoma to the brain. The team will be addressing functional mobility, strength, stamina, balance, safety, adaptive techniques and equipment, self-care, bowel and bladder mgt, patient and caregiver education, NMR, cognition, communication, behavior, community reentry. Goals have been set at supervision to min assist with self-care, supervision for mobility and min assist for cognition.   Due to the current state of emergency, patients may not be receiving their 3  hours per day of Medicare-mandated therapy.    Meredith Staggers, MD, FAAPMR      See Team Conference Notes for weekly updates to the plan of care

## 2020-10-28 NOTE — Progress Notes (Signed)
Occupational Therapy Session Note  Patient Details  Name: Sean [name] MRN: [id number] Date of Birth: 1965/12/11  Today's Date: 10/28/2020 OT Individual Time: 6553-7482 OT Individual Time Calculation (min): 58 min   Short Term Goals: Week 1:  OT Short Term Goal 1 (Week 1): Pt will complete sit<>stand out of shower with Mod cues OT Short Term Goal 2 (Week 1): Pt will complete 1 step of UB dressing task with mod questioinong cues OT Short Term Goal 3 (Week 1): Pt will complete 1 step of tolieting task  Skilled Therapeutic Interventions/Progress Updates:    Pt greeted in bed via handoff from NT. Breakfast tray had just arrived. He was agreeable to sit EOB to eat. Note that it took pt ~14 minutes to initiate supine<sit with max cues, pt with significant deficits pertaining to initiation and sustained attention to task. With gentle facilitation of LE movement EOB pt returning LEs back into bed x2, often repeating "It's about time to get up" and "it's time for some grub." Supervision for supine<sit in time. He required assistance for setup of meal and motor planning, tried to take a bite of an entire piece of french toast, unable to ?coordinate vs motor plan act of cutting his food. Vcs for Lt attention when affected limb occasionally knocked over his fruit cup. He did initiate using the Lt hand to move his coffee to the Rt side of his tray, when spearing his cut up pieces of french toast x2, and when eating 1 half of his English muffin. Note that pt would ask OT nonsensical questions like "how much do they pay for insurance here?" and "what are the poor people doing?" Pt throughout session was reminded that he was in the hospital, appeared generally confused. He would not return to bed or transfer to a chair at end of session, stated "I just want to stay right here." He was left seated EOB with NT present to supervise.  Therapy Documentation Precautions:  Precautions Precautions: Fall Precaution  Comments: L hemiplegia Restrictions Weight Bearing Restrictions: No Pain: pt denied pain during tx   ADL: ADL Eating: Maximal assistance,Maximal cueing Grooming: Maximal assistance,Maximal cueing Upper Body Bathing: Maximal assistance,Maximal cueing Lower Body Bathing: Maximal assistance,Maximal cueing Upper Body Dressing: Maximal assistance,Maximal cueing Lower Body Dressing: Maximal assistance,Maximal cueing Toilet Transfer: Minimal assistance Walk-In Shower Transfer: Minimal assistance     Therapy/Group: Individual Therapy  [name] [name] 10/28/2020, 12:27 PM

## 2020-10-28 NOTE — Progress Notes (Signed)
Speech Language Pathology Daily Session Note  Patient Details  Name: Sean [name] MRN: [id number] Date of Birth: 06/07/1966  Today's Date: 10/28/2020 SLP Individual Time: 1100-1200 SLP Individual Time Calculation (min): 60 min  Short Term Goals: Week 1: SLP Short Term Goal 1 (Week 1): Patient will initaite functional tasks with Mod A multimodal cues in 50% of opportunities SLP Short Term Goal 2 (Week 1): Patient will verbally respond to questions in 50% of opportunities with Mod A multimodal cues. SLP Short Term Goal 3 (Week 1): Patient will demonstrate orientation to place, time and situation with Mod A multimodal cues. SLP Short Term Goal 4 (Week 1): Patient will demonstrate sustained attention to functional tasks for 5 minutes with Mod A multimodal cues for redirection. SLP Short Term Goal 5 (Week 1): Patient will demonstrate functional problem solving for basic and familiar tasks with Mod A multimodal cues.  Skilled Therapeutic Interventions:   Patient seen for skilled ST session with family member also present and available for education regarding his deficits. Patient was agreeable to treatment, was pleasant and sitting up in recliner. He exhibits significantly impaired attention without any awareness to deficits. Family member also mentioned he seemed to have visual deficits. After trialing various tasks and visual presentations, SLP concluded that patient's severe attention deficit is impacting his ability to visual attend as well and if he has a true visual impairment, this could be contributing. Patient able to focus and identify alphabet letter on white paper when written with bold black marker. He then couldn't identify a triangle written in same manner, saying, "there's nothing". He was able to identify colors and shapes by matching to each other in field of two with mod-max A cues for attention and to initiate. Patient continues to benefit from skilled SLP intervention to maximize  cognitive function prior to discharge.  Pain Pain Assessment Pain Scale: 0-10 Pain Score: 0-No pain  Therapy/Group: Individual Therapy  Sonia Baller, MA, CCC-SLP Speech Therapy

## 2020-10-28 NOTE — Progress Notes (Signed)
PROGRESS NOTE   Subjective/Complaints: Pt sidelying in bed. Says he's comfortable and that things went ok in therapy yesterday. Able to sleep last night. Denied headache  ROS: Patient denies fever, rash, sore throat, blurred vision, nausea, vomiting, diarrhea, cough, shortness of breath or chest pain, or mood change.    Objective:   No results found. Recent Labs    10/27/20 0443  WBC 15.3*  HGB 14.1  HCT 41.2  PLT 264   Recent Labs    10/27/20 0443  NA 137  K 4.4  CL 98  CO2 28  GLUCOSE 117*  BUN 19  CREATININE 0.75  CALCIUM 9.1    Intake/Output Summary (Last 24 hours) at 10/28/2020 1120 Last data filed at 10/28/2020 0900 Gross per 24 hour  Intake 250 ml  Output --  Net 250 ml        Physical Exam: Vital Signs Blood pressure 118/76, pulse (!) 57, temperature 99 F (37.2 C), resp. rate 18, height 5\' 11"  (1.803 m), weight 95 kg, SpO2 97 %.  Constitutional: No distress . Vital signs reviewed. HEENT: EOMI, oral membranes moist Neck: supple Cardiovascular: RRR without murmur. No JVD    Respiratory/Chest: CTA Bilaterally without wheezes or rales. Normal effort    GI/Abdomen: BS +, non-tender, non-distended Ext: no clubbing, cyanosis, or edema Psych: flat but pleasant and cooperative Skin: Clean and intact, residual adhesive on scalp, NSL R antecub Neuro: No focal CN abnl. Oriented to person, hospital. Slow to process with impaired initiation and insight/awareness. Followed all basic commands. RUE and RLE grossly 5/5. LUE 3+ to 4/5 with apraxia, inattention. LLE 3 to 4/5 also with apraxia, inattention. Seems to sense LT and pain in all 4's.   Musculoskeletal: Full ROM, No pain with AROM or PROM in the neck, trunk, or extremities. Posture appropriate     Assessment/Plan: 1. Functional deficits which require 3+ hours per day of interdisciplinary therapy in a comprehensive inpatient rehab  setting.  Physiatrist is providing close team supervision and 24 hour management of active medical problems listed below.  Physiatrist and rehab team continue to assess barriers to discharge/monitor patient progress toward functional and medical goals  Care Tool:  Bathing    Body parts bathed by patient: Chest,Abdomen,Face   Body parts bathed by helper: Right arm,Left arm,Front perineal area,Buttocks,Right upper leg,Left upper leg,Right lower leg,Left lower leg     Bathing assist Assist Level: Total Assistance - Patient < 25%     Upper Body Dressing/Undressing Upper body dressing Upper body dressing/undressing activity did not occur (including orthotics): N/A What is the patient wearing?: Pull over shirt    Upper body assist Assist Level: Total Assistance - Patient < 25%    Lower Body Dressing/Undressing Lower body dressing    Lower body dressing activity did not occur: N/A What is the patient wearing?: Pants     Lower body assist Assist for lower body dressing: Total Assistance - Patient < 25%     Toileting Toileting    Toileting assist Assist for toileting: Contact Guard/Touching assist     Transfers Chair/bed transfer  Transfers assist     Chair/bed transfer assist level: Contact Guard/Touching assist  Locomotion Ambulation   Ambulation assist      Assist level: Contact Guard/Touching assist Assistive device: No Device Max distance: 150'   Walk 10 feet activity   Assist     Assist level: Contact Guard/Touching assist Assistive device: No Device   Walk 50 feet activity   Assist    Assist level: Minimal Assistance - Patient > 75% Assistive device: No Device    Walk 150 feet activity   Assist    Assist level: Minimal Assistance - Patient > 75% Assistive device: No Device    Walk 10 feet on uneven surface  activity   Assist     Assist level: Minimal Assistance - Patient > 75% Assistive device:  (handrail)    Wheelchair     Assist Will patient use wheelchair at discharge?: No             Wheelchair 50 feet with 2 turns activity    Assist            Wheelchair 150 feet activity     Assist          Blood pressure 118/76, pulse (!) 57, temperature 99 F (37.2 C), resp. rate 18, height 5\' 11"  (1.803 m), weight 95 kg, SpO2 97 %.  Medical Problem List and Plan: 1.  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   -Follow-up oncology service Dr. Betsy Coder as well as Dr. Mickeal Skinner ongoing plan of care.  Continue Decadron 4 mg every 12 hours until further notice             -patient may  shower             -ELOS/Goals: min Assist - 14-18 days  --Continue CIR therapies including PT, OT, and SLP  2.  Antithrombotics: -DVT/anticoagulation: SCDs             -antiplatelet therapy: N/A 3. Pain Management: Tylenol  And oxycodone as needed for headaches  -he used one oxycodone last night  -asked him to let us know if his pain is not controlled 4. Mood: Melatonin 5 mg nightly as needed sleep             -antipsychotic agents: N/A  -observe for depression. Affect very flat, but may be d/t location of mets 5. Neuropsych: This patient is capable of making decisions on his own behalf. 6. Skin/Wound Care: Routine skin checks 7. Fluids/Electrolytes/Nutrition: pt admit that he doesn't have a great appetite  -I personally reviewed the patient's labs today.    -add protein supp for low albumin  -eating well at present (75-100%) 8.  Seizure prophylaxis.  Keppra 500 mg twice daily 9.  . Constipation- moving bowels 10. Leukocytosis: d/t steroids      LOS: 2 days A FACE TO FACE EVALUATION WAS PERFORMED  Meredith Staggers 10/28/2020, 11:20 AM

## 2020-10-28 NOTE — Progress Notes (Signed)
Physical Therapy Session Note  Patient Details  Name: Sean [name] MRN: [id number] Date of Birth: 13-Jun-1966  Today's Date: 10/28/2020 PT Individual Time: 5631-4970 PT Individual Time Calculation (min): 72 min   Short Term Goals: Week 1:  PT Short Term Goal 1 (Week 1): Pt will perform bed mobility with minA. PT Short Term Goal 2 (Week 1): Pt will perform bed to chair transfer with CGA. PT Short Term Goal 3 (Week 1): Pt will ambulate x150' with CGA. PT Short Term Goal 4 (Week 1): Pt will complete x4 steps with BHRs and CGA.  Skilled Therapeutic Interventions/Progress Updates:     Pt received seated in recliner and agrees to therapy. No complaint of pain. Sit to stand with minA and stand step transfer to [facility] with minA and cues for positioning. WC transport to gym for time management. Pt verbalizes need for bowel movement. Pt ambulates 150' back to room with minA due to several slight LOBs, transferring to toilet with minA. Pt is continent of bowel and independent with pericare. Pt stands and washes hands at sink with verbal cues. Pt ambulates 150' back to gym with minA. Pt attempts Nustep but has difficulty attending to task, becoming internally distracted and pausing activity after 30 seconds. Pt resumes activity with cues but is unable to maintain consistent activity longer than 30 seconds, despite cueing from PT. Pt then performs gait training over treadmill with litegait. Pt steps up onto treadmill with minA and is able to maintain standing with Bilateral upper extremity support as PT dons harness. Pt performs following bouts on treadmill: 2:09 at 83mph for 176' 2:45 at 59mph for 239' 4:38 at 0.9 mph for 359' Pt has difficulty clearing L foot during ambulation. PT wraps L ankle and foot with ace wrap to promote dorsiflexion and eversion, then provides manual assistance with each step to clear and progress L foot. Pt takes shortened stride lengths bilaterally despite consistent cueing. Pt also  attempts to step off treadmill with R leg several times. PT asks pt if he is tired and needs break and pt responds that he's "ok". Unclear why he tries to step off treadmill.   Pt requires minA to step off treadmill. WC transport back to room. Pt left seated in WC with alarm intact and all needs within reach.  Therapy Documentation Precautions:  Precautions Precautions: Fall Precaution Comments: L hemiplegia Restrictions Weight Bearing Restrictions: No   Therapy/Group: Individual Therapy  Breck Coons 10/28/2020, 3:53 PM

## 2020-10-30 ENCOUNTER — Inpatient Hospital Stay: Payer: 59

## 2020-10-30 NOTE — Progress Notes (Signed)
Occupational Therapy Session Note  Patient Details  Name: Sean [name] MRN: [id number] Date of Birth: 10/10/1965  Today's Date: 10/30/2020 OT Individual Time: 1200-1300 OT Individual Time Calculation (min): 60 min   Short Term Goals: Week 1:  OT Short Term Goal 1 (Week 1): Pt will complete sit<>stand out of shower with Mod cues OT Short Term Goal 2 (Week 1): Pt will complete 1 step of UB dressing task with mod questioinong cues OT Short Term Goal 3 (Week 1): Pt will complete 1 step of tolieting task  Skilled Therapeutic Interventions/Progress Updates:    Pt greeted semi-reclined in bed with sister present and agreeable to OT treatment session. Pt declined to shower today stating he already washed up. Per patients sister, pt had a large BM and nursing had just cleaned him up. Pt needed max verbal and tactile cues to initiate bed mobility. OT ended up having to move each leg towards EOB for pt to initiate. He was then able to follow commands to stand-pivot to wc with CGA. Pt declined any BADL tasks. Pt brought down to therapy gym in quiet environment. Worked on L fine motor control with graded peg board task . Pt able to place 3 up to three pegs without cues to continue. Progressed to 2 color pattern with clothes pins. Pt was able to follow commands to replicate pattern with 2 clothes pins, then needed mod cues to continue pattern. Tried 3 color puzzle, but pt too internally distracted and was not able to complete pattern. Worked on visual scanning and initiation with BITS activity. Pt was able to complete up to 5 dots at most in a row. Pt needed more cues to visually scan to periferie of L or R side. Attempted to put pt on UE Ergometer, but he was unable to maintain attention to task for more than 15 seconds, so he was unable to get the arm workout intended. Pt returned to room in wc and left seated up in wc with alarm belt on and sister present.    Therapy Documentation Precautions:   Precautions Precautions: Fall Precaution Comments: L hemiplegia Restrictions Weight Bearing Restrictions: No Pain: Pain Assessment Pain Scale: 0-10 No pain   Therapy/Group: Individual Therapy  Valma Cava 10/30/2020, 1:06 PM

## 2020-10-30 NOTE — Progress Notes (Signed)
Speech Language Pathology Daily Session Note  Patient Details  Name: Sean [name] MRN: [id number] Date of Birth: 1965/10/23  Today's Date: 10/30/2020 SLP Individual Time: 0802-0833 SLP Individual Time Calculation (min): 31 min  Short Term Goals: Week 1: SLP Short Term Goal 1 (Week 1): Patient will initaite functional tasks with Mod A multimodal cues in 50% of opportunities SLP Short Term Goal 2 (Week 1): Patient will verbally respond to questions in 50% of opportunities with Mod A multimodal cues. SLP Short Term Goal 3 (Week 1): Patient will demonstrate orientation to place, time and situation with Mod A multimodal cues. SLP Short Term Goal 4 (Week 1): Patient will demonstrate sustained attention to functional tasks for 5 minutes with Mod A multimodal cues for redirection. SLP Short Term Goal 5 (Week 1): Patient will demonstrate functional problem solving for basic and familiar tasks with Mod A multimodal cues.  Skilled Therapeutic Interventions: Pt seen for skilled ST services with focus on cognitive goals, pt sister present throughout. SLP facilitating orientation task by providing min verbal and visual aids to increase recall and carryover of temporal orientation concepts. Pt administered calendar to increase recall and carryover of information. Pt 100% accurate with biographical information this date. Pt sister continues to endorse change in vision, pt shown large picture cards to stimulate simple problem solving and attention task. With mod verbal cues, pt able to ID pictures with 100% accuracy. Pt eyes noted to "dart" around the room frequently vs focused attention on the picture impacting accuracy and timeliness of task. Again, severely impaired attention appears to impact visualizations vs true visual impairment. Pt left in bed with alarm set, all questions answered and all needs within reach. Cont ST POC.   Pain Pain Assessment Pain Scale: 0-10 Pain Score: 0-No pain  Therapy/Group:  Individual Therapy  Dewaine Conger 10/30/2020, 9:01 AM

## 2020-10-30 NOTE — Progress Notes (Signed)
Physical Therapy Session Note  Patient Details  Name: Zayden Maffei MRN: [id number] Date of Birth: 1965-12-17  Today's Date: 10/30/2020 PT Missed Time: 51 Minutes Missed Time Reason: Patient unwilling to participate  Short Term Goals: Week 1:  PT Short Term Goal 1 (Week 1): Pt will perform bed mobility with minA. PT Short Term Goal 2 (Week 1): Pt will perform bed to chair transfer with CGA. PT Short Term Goal 3 (Week 1): Pt will ambulate x150' with CGA. PT Short Term Goal 4 (Week 1): Pt will complete x4 steps with BHRs and CGA.  Skilled Therapeutic Interventions/Progress Updates:     Pt received supine in bed. Initially says he is agreeable to participate with therapy but does not initiate any mobility, despite cues from PT and increased time provided. Pt continues to lie in bed and verbalizes that he "just wants to chill". PT encourages pt to participate in session later this PM and pt nods understanding.  Therapy Documentation Precautions:  Precautions Precautions: Fall Precaution Comments: L hemiplegia Restrictions Weight Bearing Restrictions: No General: PT Amount of Missed Time (min): 45 Minutes PT Missed Treatment Reason: Patient unwilling to participate   Therapy/Group: Individual Therapy  Breck Coons, PT, DPT 10/30/2020, 3:32 PM

## 2020-10-30 NOTE — Progress Notes (Signed)
Physical Therapy Session Note  Patient Details  Name: Sean [name] MRN: [id number] Date of Birth: 06-May-1966  Today's Date: 10/30/2020 PT Individual Time: 1615-1650 PT Individual Time Calculation (min): 35 min  PT Amount of Missed Time (min): 10 Minutes PT Missed Treatment Reason: Patient unwilling to participate  Short Term Goals: Week 1:  PT Short Term Goal 1 (Week 1): Pt will perform bed mobility with minA. PT Short Term Goal 2 (Week 1): Pt will perform bed to chair transfer with CGA. PT Short Term Goal 3 (Week 1): Pt will ambulate x150' with CGA. PT Short Term Goal 4 (Week 1): Pt will complete x4 steps with BHRs and CGA.  Skilled Therapeutic Interventions/Progress Updates:    Pt received standing in bathroom with NT having just finished toileting. Pt agreeable to participate in PT session. Pt is able to wash hands while standing at sink with close Supervision for balance. Dependent transport via w/c to therapy gym for time and energy conservation. Standing tolerance while engaging in sequencing task on BITS. Attempt to have pt select large letters in alphabetical order. Pt initially able to complete up to 5 letters with max cueing for attention to task. Pt then becomes too internally distracted to continue and unable to follow cues to continue with task. Ambulation x 100 ft with CGA for balance with no AD, attempted to engage patient in dual-tasking by naming colors during gait. Pt becomes distracted by window and sits down on mat table by window, requires increased time and cueing to return attention to therapy task. Standing balance performing ball toss and/or shooting basketball hoops x 5 reps before pt again becomes distracted and no longer able to attend to task. Pt then unable to be encouraged to further functionally participate in therapy session and requires increased time and encouragement to stand up from mat table and return to his room. Pt left seated in w/c in room with needs in  reach, quick release belt and chair alarm in place, sister present. Pt missed 10 min of scheduled therapy session due to unwillingness to participate.  Therapy Documentation Precautions:  Precautions Precautions: Fall Precaution Comments: L hemiplegia Restrictions Weight Bearing Restrictions: No   Therapy/Group: Individual Therapy   Excell Seltzer, PT, DPT  10/30/2020, 4:53 PM

## 2020-10-30 NOTE — Progress Notes (Addendum)
HEMATOLOGY-ONCOLOGY PROGRESS NOTE  SUBJECTIVE: Sitting in wheelchair this afternoon. Reports mild back pain. Had a headache yesterday. The patient noted some confusion earlier today.    Oncology History  Melanoma of skin ([place])  10/05/2020 Initial Diagnosis   Melanoma of skin ([place])   10/05/2020 Cancer Staging   Staging form: Melanoma of the Skin, AJCC 8th Edition - Pathologic: Stage IV (pTX, pNX, pM1) - Signed by [name], MD on 10/05/2020   10/16/2020 -  Chemotherapy    Patient is on Treatment Plan: MELANOMA NIVOLUMAB + IPILIMUMAB (1/3) Q21D / NIVOLUMAB Q14D       PHYSICAL EXAMINATION:  Vitals:   10/30/20 0401 10/30/20 1341  BP: 124/78 126/75  Pulse: 64 85  Resp: 18 19  Temp: 98 F (36.7 C) 97.9 F (36.6 C)  SpO2: 98% 96%   Filed Weights   10/26/20 1518  Weight: 95 kg    Intake/Output from previous day: 02/13 0701 - 02/14 0700 In: 640 [P.O.:640] Out: -   GENERAL: Awake and alert, periods of confusion SKIN: skin color, texture, turgor are normal, no rashes or significant lesions OROPHARYNX: No thrush ABDOMEN:abdomen soft, non-tender and normal bowel sounds NEURO: Alert, oriented to person, place and year, follows simple commands, moves all extremities to command   LABORATORY DATA:  I have reviewed the data as listed CMP Latest Ref Rng & Units 10/27/2020 10/24/2020 10/22/2020  Glucose 70 - 99 mg/dL 117(H) 110(H) 167(H)  BUN 6 - 20 mg/dL 19 21(H) 19  Creatinine 0.61 - 1.24 mg/dL 0.75 0.69 0.77  Sodium 135 - 145 mmol/L 137 137 139  Potassium 3.5 - 5.1 mmol/L 4.4 3.4(L) 4.0  Chloride 98 - 111 mmol/L 98 102 105  CO2 22 - 32 mmol/L 28 23 23   Calcium 8.9 - 10.3 mg/dL 9.1 9.2 9.0  Total Protein 6.5 - 8.1 g/dL 6.3(L) - -  Total Bilirubin 0.3 - 1.2 mg/dL 0.9 - -  Alkaline Phos 38 - 126 U/L 87 - -  AST 15 - 41 U/L 34 - -  ALT 0 - 44 U/L 100(H) - -    Lab Results  Component Value Date   WBC 15.3 (H) 10/27/2020   HGB 14.1 10/27/2020   HCT 41.2 10/27/2020   MCV  90.7 10/27/2020   PLT 264 10/27/2020   NEUTROABS 10.4 (H) 10/27/2020    DG Chest 2 View  Result Date: 10/20/2020 CLINICAL DATA:  Altered mental status EXAM: CHEST - 2 VIEW COMPARISON:  PET-CT September 25, 2020 FINDINGS: The heart size and mediastinal contours are within normal limits. Streaky left basilar opacity not significantly changed from prior favor atelectasis. Known right upper lobe pulmonary nodule not visualized on today's exam. No pleural effusion. No pneumothorax. The visualized skeletal structures are unremarkable. IMPRESSION: Streaky left basilar opacity, favor atelectasis though developing infection not excluded. Electronically Signed   By: Dahlia Bailiff MD   On: 10/20/2020 17:55   CT HEAD WO CONTRAST  Result Date: 10/25/2020 CLINICAL DATA:  Altered level of consciousness, metastatic melanoma EXAM: CT HEAD WITHOUT CONTRAST TECHNIQUE: Contiguous axial images were obtained from the base of the skull through the vertex without intravenous contrast. COMPARISON:  10/20/2020, 10/24/2020 FINDINGS: Brain: Unenhanced images of the brain again demonstrate numerous hemorrhagic metastases compatible with known metastatic melanoma. The size and number of lesions is unchanged. Diffuse areas of vasogenic edema are seen throughout the bilateral cerebral hemispheres, with effacement of the lateral ventricles right greater than left. No significant midline shift. No acute infarct. No  acute extra-axial fluid collections. Vascular: No hyperdense vessel or unexpected calcification. Skull: Normal. Negative for fracture or focal lesion. Stable postsurgical changes from prior left frontal and left occipital craniotomy. Sinuses/Orbits: No acute finding. Other: None. IMPRESSION: 1. Stable diffuse intracranial metastases compatible with metastatic melanoma. Stable vasogenic edema and effacement of the lateral ventricles. 2. No significant change since recent MRI and head CT. Electronically Signed   By: Randa Ngo  M.D.   On: 10/25/2020 22:56   CT Head Wo Contrast  Result Date: 10/20/2020 CLINICAL DATA:  Mental status change, unknown cause. EXAM: CT HEAD WITHOUT CONTRAST TECHNIQUE: Contiguous axial images were obtained from the base of the skull through the vertex without intravenous contrast. COMPARISON:  MRI head 09/30/20 FINDINGS: Brain: Interval increase in number and size of numerous supratentorial hemorrhagic metastases. The largest lesion on the left measures up to 5 point 1 cm in the left frontal lobe. The largest lesion on the right measures up to 3.3 cm in the right frontal lobe. Of note, there is a hemorrhagic lesion in the right thalamus. There is exuberant vasogenic edema associated with these lesions. No substantial midline shift. There is significant effacement of the right lateral ventricle. No hydrocephalus. Infratentorially, there is an 8 mm hemorrhagic metastasis within the superior right cerebellum. Vascular: No hyperdense vessel identified. Skull: No acute fracture. Prior left frontal and left parietal craniotomy. Sinuses/Orbits: No acute findings. Other: No mastoid effusions. IMPRESSION: Significant interval increase in number and size of numerous infratentorial and supratentorial hemorrhagic metastases with exuberant edema, as detailed above. Resulting effacement of the right lateral ventricle. An MRI with contrast could provide more complete evaluation if clinically indicated. Findings discussed with Dr. Gilford Raid Via telephone at 6:32 PM. Electronically Signed   By: Margaretha Sheffield MD   On: 10/20/2020 18:35   MR Brain W Wo Contrast  Result Date: 10/24/2020 CLINICAL DATA:  Metastatic melanoma EXAM: MRI HEAD WITHOUT AND WITH CONTRAST TECHNIQUE: Multiplanar, multiecho pulse sequences of the brain and surrounding structures were obtained without and with intravenous contrast. CONTRAST:  42mL GADAVIST GADOBUTROL 1 MMOL/ML IV SOLN COMPARISON:  None. FINDINGS: Brain: There are numerous lesions throughout  the brain, many of which are new and/or have increased in size. There are approximately 20 lesions. The largest lesions are in the left frontal operculum (3.4 x 3.0 cm, 11:15), right basal ganglia (3.5 x 2.5 cm, 11:13) and right occipital lobe (2.8 x 1.9 cm, 16:26). The lesions demonstrate intrinsic hyperintense T1-weighted signal. Beyond that, there is little demonstrated contrast enhancement. There is new mass effect on the right lateral ventricle without significant midline shift. There is moderate vasogenic edema surrounding lesions in both frontal lobes, the right basal ganglia, both occipital lobes and both parietal lobes. Vascular: Normal flow voids. Skull and upper cervical spine: Normal marrow signal. Remote left anterior and posterior craniotomies. Sinuses/Orbits: Negative. Other: None. IMPRESSION: 1. Progression of metastatic melanoma with multiple new or increased size lesions. 2. Moderate vasogenic edema surrounding lesions in both frontal lobes, right basal ganglia, both occipital lobes and both parietal lobes. 3. New mass effect on the right lateral ventricle without significant midline shift. Electronically Signed   By: Ulyses Jarred M.D.   On: 10/24/2020 03:10     ASSESSMENT AND PLAN: 1. Multiple brain metastases, unknown primary tumor site  CT brain 09/14/2020-multiple hemorrhagic metastases with surrounding edema in the bilateral cerebral hemispheres  CTs chest, abdomen, pelvis 09/14/2020-1.4 cm right upper lobe subpleural nodule, 0.5 cm right lower lobe nodule, multiple hypodensities  in the liver suspicious for metastases, 1.5 cm right upper pole hypodense kidney lesion-indeterminate  MRI abdomen 09/15/2020-multifocal T1 hyperintense liver lesions, no kidney mass  Ultrasound-guided biopsy of a segment 4B liver lesion 09/18/2020-benign liver parenchyma with steatosis, no evidence of malignancy  PET 09/25/2020- focal area of hypermetabolism in the right hilum, multiple areas of the  liver, pelvic musculature, and left thigh-no CT correlate with any of these areas on the noncontrast CT, 10 mm anterior right upper lobe nodule with low-level hypermetabolism, additional tiny pulmonary nodules too small to characterize by PET  SRS to 10 brain lesions 09/27/2020  Resection of left occipital and left frontal lesions on 09/29/2020-pathology consistent with malignant melanoma  Cycle 1 ipilimumab/nivolumab 10/16/2020  CT of the brain without contrast 10/26/2020 showed significant interval increase in the number and size of numerous metastases  Brain MRI 10/24/2020-multiple new and increased brain metastases, moderate edema surrounding multiple lesions, new mass-effect at the right lateral ventricle   2.  Left-sided weakness, new onset seizure secondary to #1, weakness has resolved 3.  Hyperlipidemia 4.  Elevated liver enzymes and bilirubin  Liver enzymes more elevated on repeat labs 09/27/2020, improved 09/28/2020 5.  Headache and nausea/vomiting secondary to #1, resolved 6.  Hospital admission 10/20/2020-encephalopathy  Mr. Keplinger appears stable.  He continues to have intermittent confusion.  Extensive review of the brain MRI by neuroradiology, neuro-oncology, and radiation oncology is consistent with progression of untreated brain metastases.  Treated lesions have expected posttreatment changes.  The significant progression over several weeks is unusual.  It is possible the MRI findings are in part related to hemorrhage or tumor flare from immunotherapy.  Case was reviewed with an outside oncologist who thinks changes could be related to a flare secondary to immunotherapy.  Would recommend nivolumab alone moving forward.  Will consider taper of dexamethasone to 4 mg in the am and 2 mg in the pm. Would like to get dexamethasone down to less than 2 mg/day prior to restarting immunotherapy. Plan for repeat MRI of the brain and CT chest later this week.   Recommendations: 1. Continue  dexamethasone 4 mg every 12 hours for now with plans to taper to 4 mg in the am and 2 mg in the pm.  2.  Continue therapy in CIR. 3.  Medical oncology will continue following him on the rehab unit.   LOS: 4 days   Mikey Bussing 10/30/20 Mr. Dahan was interviewed and examined.  He is less alert today.  Appears disinterested in participating with exam.  I will discuss the indication for tapering the Decadron with neuro oncology.  We will plan for a restaging brain CT by 11/02/2020.  The LDH remains mildly elevated.  This can serve as a marker of the systemic tumor burden.  We will also obtain a chest CT to follow-up on lung nodules.  I was present for greater than 50% of today's visit.  I performed medical decision making.

## 2020-10-30 NOTE — Care Management (Signed)
Inpatient Rehabilitation Center Individual Statement of Services  Patient Name:  Sean [name]  Date:  10/30/2020  Welcome to the [facility].  Our goal is to provide you with an individualized program based on your diagnosis and situation, designed to meet your specific needs.  With this comprehensive rehabilitation program, you will be expected to participate in at least 3 hours of rehabilitation therapies Monday-Friday, with modified therapy programming on the weekends.  Your rehabilitation program will include the following services:  Physical Therapy (PT), Occupational Therapy (OT), Speech Therapy (ST), 24 hour per day rehabilitation nursing, Therapeutic Recreaction (TR), Psychology, Neuropsychology, Care Coordinator, Rehabilitation Medicine, Nutrition Services, Pharmacy Services and Other  Weekly team conferences will be held on Tuesdays to discuss your progress.  Your Inpatient Rehabilitation Care Coordinator will talk with you frequently to get your input and to update you on team discussions.  Team conferences with you and your family in attendance may also be held.  Expected length of stay: 2-3 weeks    Overall anticipated outcome: Supervision  Depending on your progress and recovery, your program may change. Your Inpatient Rehabilitation Care Coordinator will coordinate services and will keep you informed of any changes. Your Inpatient Rehabilitation Care Coordinator's name and contact numbers are listed  below.  The following services may also be recommended but are not provided by the [facility] will be made to provide these services after discharge if needed.  Arrangements include referral to agencies that provide these services.  Your insurance has been verified to be:  [facility]  Your primary doctor  is:  Deland Pretty  Pertinent information will be shared with your doctor and your insurance company.  Inpatient Rehabilitation Care Coordinator:  Cathleen Corti [phone] or [phone]  Information discussed with and copy given to patient by: Rana Snare, 10/30/2020, 10:46 AM

## 2020-10-30 NOTE — Progress Notes (Signed)
Patient slept fairly well throughout the shift. Continues with intermittent confusion, pt noted to be difficulty following direction of staff during transfer to the bathroom. However verbally appropriate with verbal commands and questions. No prn medications given this shift. Incontinent of urine x1 this shift. Vital signs stable. Denies pain and no s/sx of pain so far this shift.

## 2020-10-30 NOTE — Progress Notes (Signed)
PROGRESS NOTE   Subjective/Complaints: No complaints this morning Denies headache Sister requested that Protonix be d/ced. Discussed that rationale in GI ppx while on steroids. He is currently not having any GI symptoms. I have d/ced as per her request.   ROS: Patient denies fever, rash, sore throat, blurred vision, nausea, vomiting, diarrhea, cough, shortness of breath or chest pain, or mood change.    Objective:   No results found. No results for input(s): WBC, HGB, HCT, PLT in the last 72 hours. No results for input(s): NA, K, CL, CO2, GLUCOSE, BUN, CREATININE, CALCIUM in the last 72 hours.  Intake/Output Summary (Last 24 hours) at 10/30/2020 0906 Last data filed at 10/29/2020 1930 Gross per 24 hour  Intake 540 ml  Output --  Net 540 ml        Physical Exam: Vital Signs Blood pressure 124/78, pulse 64, temperature 98 F (36.7 C), resp. rate 18, height 5\' 11"  (1.803 m), weight 95 kg, SpO2 98 %. Gen: no distress, normal appearing HEENT: oral mucosa pink and moist, NCAT Cardio: Reg rate Chest: normal effort, normal rate of breathing Abd: soft, non-distended Ext: no edema Psych: flat but pleasant and cooperative Skin: Clean and intact, residual adhesive on scalp, NSL R antecub Neuro: No focal CN abnl. Oriented to person, hospital. Slow to process with impaired initiation and insight/awareness. Followed all basic commands. RUE and RLE grossly 5/5. LUE 3+ to 4/5 with apraxia, inattention. LLE 3 to 4/5 also with apraxia, inattention. Seems to sense LT and pain in all 4's.   Musculoskeletal: Full ROM, No pain with AROM or PROM in the neck, trunk, or extremities. Posture appropriate     Assessment/Plan: 1. Functional deficits which require 3+ hours per day of interdisciplinary therapy in a comprehensive inpatient rehab setting.  Physiatrist is providing close team supervision and 24 hour management of active medical  problems listed below.  Physiatrist and rehab team continue to assess barriers to discharge/monitor patient progress toward functional and medical goals  Care Tool:  Bathing    Body parts bathed by patient: Chest,Abdomen,Face   Body parts bathed by helper: Right arm,Left arm,Front perineal area,Buttocks,Right upper leg,Left upper leg,Right lower leg,Left lower leg     Bathing assist Assist Level: Total Assistance - Patient < 25%     Upper Body Dressing/Undressing Upper body dressing Upper body dressing/undressing activity did not occur (including orthotics): N/A What is the patient wearing?: Pull over shirt    Upper body assist Assist Level: Maximal Assistance - Patient 25 - 49%    Lower Body Dressing/Undressing Lower body dressing    Lower body dressing activity did not occur: N/A What is the patient wearing?: Pants     Lower body assist Assist for lower body dressing: Total Assistance - Patient < 25%     Toileting Toileting    Toileting assist Assist for toileting: Contact Guard/Touching assist     Transfers Chair/bed transfer  Transfers assist     Chair/bed transfer assist level: Contact Guard/Touching assist     Locomotion Ambulation   Ambulation assist      Assist level: Total Assistance - Patient < 25% Assistive device: Lite Gait Max distance: 72'  Walk 10 feet activity   Assist     Assist level: Minimal Assistance - Patient > 75% Assistive device: No Device   Walk 50 feet activity   Assist    Assist level: Minimal Assistance - Patient > 75% Assistive device: No Device    Walk 150 feet activity   Assist    Assist level: Minimal Assistance - Patient > 75% Assistive device: No Device    Walk 10 feet on uneven surface  activity   Assist     Assist level: Minimal Assistance - Patient > 75% Assistive device:  (handrail)   Wheelchair     Assist Will patient use wheelchair at discharge?: No              Wheelchair 50 feet with 2 turns activity    Assist            Wheelchair 150 feet activity     Assist          Blood pressure 124/78, pulse 64, temperature 98 F (36.7 C), resp. rate 18, height 5\' 11"  (1.803 m), weight 95 kg, SpO2 98 %.  Medical Problem List and Plan: 1.  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   -Follow-up oncology service Dr. Betsy Coder as well as Dr. Mickeal Skinner ongoing plan of care.  Continue Decadron 4 mg every 12 hours until further notice             -patient may  shower             -ELOS/Goals: min Assist - 14-18 days  --Continue CIR therapies including PT, OT, and SLP   -Dan spoke with Dr. [name]- he will taper steroids.  2.  Antithrombotics: -DVT/anticoagulation: SCDs             -antiplatelet therapy: N/A 3. Pain Management: Tylenol  And oxycodone as needed for headaches  -he used one oxycodone last night  -asked him to let us know if his pain is not controlled 4. Mood: Melatonin 5 mg nightly as needed sleep             -antipsychotic agents: N/A  -observe for depression. Affect very flat, but may be d/t location of mets  -sleeping better 5. Neuropsych: This patient is capable of making decisions on his own behalf. 6. Skin/Wound Care: Routine skin checks. May removing remaining suture- will placed nursing order 7. Fluids/Electrolytes/Nutrition: pt admit that he doesn't have a great appetite  -I personally reviewed the patient's labs today.    -add protein supp for low albumin  -eating well at present (75-100%) 8.  Seizure prophylaxis. Continue Keppra 500 mg twice daily 9.  . Constipation- moving bowels 10. Leukocytosis: d/t steroids      LOS: 4 days A FACE TO FACE EVALUATION WAS PERFORMED  Lorine Iannaccone P Cedricka Sackrider 10/30/2020, 9:06 AM

## 2020-10-31 MED ORDER — DEXAMETHASONE 2 MG PO TABS
2.0000 mg | ORAL_TABLET | Freq: Every evening | ORAL | Status: DC
  Administered 2020-10-31 – 2020-11-02 (×3): 2 mg via ORAL
  Filled 2020-10-31 (×3): qty 1

## 2020-10-31 MED ORDER — DEXAMETHASONE 4 MG PO TABS
4.0000 mg | ORAL_TABLET | Freq: Every morning | ORAL | Status: AC
  Administered 2020-11-01 – 2020-11-07 (×7): 4 mg via ORAL
  Filled 2020-10-31 (×7): qty 1

## 2020-10-31 MED ORDER — METHYLPHENIDATE HCL 5 MG PO TABS
5.0000 mg | ORAL_TABLET | Freq: Two times a day (BID) | ORAL | Status: DC
  Administered 2020-10-31 – 2020-11-02 (×4): 5 mg via ORAL
  Filled 2020-10-31 (×4): qty 1

## 2020-10-31 NOTE — Progress Notes (Signed)
  Patient Name: Sean [name] MRN: [id number] DOB: 30-Nov-1965 Referring Physician: Deland Pretty (Profile Not Attached) Date of Service: 09/27/2020 [facility], [place]                                                        End Of Treatment Note  Diagnoses: C79.31-Secondary malignant neoplasm of brain  Cancer Staging: Cancer Staging Melanoma of skin ([place]) Staging form: Melanoma of the Skin, AJCC 8th Edition - Pathologic: Stage IV (pTX, pNX, pM1) - Signed by [name], MD on 10/05/2020  Intent: Palliative  Radiation Treatment Dates: 09/27/2020 through 09/27/2020 Site Technique Total Dose (Gy) Dose per Fx (Gy) Completed Fx Beam Energies  Brain: Brain_SRS IMRT 20/20 20 1/1 6XFFF    ExacTrac, max dose=127.5% 10 vmat beams 6FFF photons  PTV1RtPar21mm 18 Gy  PTV2LtFront47mm 16Gy (pre op) PTV3LtOcc54mm 16Gy (pre op  20Gy delivered to: PTV4LtOcc67mm PTV5RtPar54mm PTV6LtFront27mm PTV7LtPar6mm PTV8LtPar63mm PTV9RtOcc73mm PTV10RtCereb58mm PTV11LtTemp82mm PTV12LtPar66mm PTV13RtTemp22mm  Narrative: The patient tolerated radiation therapy relatively well. Will proceed with resection of the two dominant lesions with Dr. Zada Finders.  Plan: The patient will follow-up with radiation oncology in 44mo.  -----------------------------------  Eppie Gibson, MD

## 2020-10-31 NOTE — Progress Notes (Addendum)
HEMATOLOGY-ONCOLOGY PROGRESS NOTE  SUBJECTIVE: The patient continues to work with therapy. Still has periods of confusion.  Oncology History  Melanoma of skin (Sean [name])  10/05/2020 Initial Diagnosis   Melanoma of skin (Sean [name])   10/05/2020 Cancer Staging   Staging form: Melanoma of the Skin, AJCC 8th Edition - Pathologic: Stage IV (pTX, pNX, pM1) - Signed by [name], MD on 10/05/2020   10/16/2020 -  Chemotherapy    Patient is on Treatment Plan: MELANOMA NIVOLUMAB + IPILIMUMAB (1/3) Q21D / NIVOLUMAB Q14D       PHYSICAL EXAMINATION:  Vitals:   10/31/20 0428 10/31/20 1338  BP: (!) 124/93 124/78  Pulse: 66 100  Resp: 18 16  Temp: 98 F (36.7 C) 98.4 F (36.9 C)  SpO2: 92% 98%   Filed Weights   10/26/20 1518  Weight: 95 kg    Intake/Output from previous day: 02/14 0701 - 02/15 0700 In: 540 [P.O.:540] Out: 600 [Urine:600]  GENERAL: Awake and alert, periods of confusion SKIN: skin color, texture, turgor are normal, no rashes or significant lesions OROPHARYNX: No thrush ABDOMEN:abdomen soft, non-tender and normal bowel sounds NEURO: Arousable, oriented to place and year, follows simple commands, moves all extremities to command   LABORATORY DATA:  I have reviewed the data as listed CMP Latest Ref Rng & Units 10/27/2020 10/24/2020 10/22/2020  Glucose 70 - 99 mg/dL 117(H) 110(H) 167(H)  BUN 6 - 20 mg/dL 19 21(H) 19  Creatinine 0.61 - 1.24 mg/dL 0.75 0.69 0.77  Sodium 135 - 145 mmol/L 137 137 139  Potassium 3.5 - 5.1 mmol/L 4.4 3.4(L) 4.0  Chloride 98 - 111 mmol/L 98 102 105  CO2 22 - 32 mmol/L 28 23 23   Calcium 8.9 - 10.3 mg/dL 9.1 9.2 9.0  Total Protein 6.5 - 8.1 g/dL 6.3(L) - -  Total Bilirubin 0.3 - 1.2 mg/dL 0.9 - -  Alkaline Phos 38 - 126 U/L 87 - -  AST 15 - 41 U/L 34 - -  ALT 0 - 44 U/L 100(H) - -    Lab Results  Component Value Date   WBC 15.3 (H) 10/27/2020   HGB 14.1 10/27/2020   HCT 41.2 10/27/2020   MCV 90.7 10/27/2020   PLT 264 10/27/2020    NEUTROABS 10.4 (H) 10/27/2020    DG Chest 2 View  Result Date: 10/20/2020 CLINICAL DATA:  Altered mental status EXAM: CHEST - 2 VIEW COMPARISON:  PET-CT September 25, 2020 FINDINGS: The heart size and mediastinal contours are within normal limits. Streaky left basilar opacity not significantly changed from prior favor atelectasis. Known right upper lobe pulmonary nodule not visualized on today's exam. No pleural effusion. No pneumothorax. The visualized skeletal structures are unremarkable. IMPRESSION: Streaky left basilar opacity, favor atelectasis though developing infection not excluded. Electronically Signed   By: Dahlia Bailiff MD   On: 10/20/2020 17:55   CT HEAD WO CONTRAST  Result Date: 10/25/2020 CLINICAL DATA:  Altered level of consciousness, metastatic melanoma EXAM: CT HEAD WITHOUT CONTRAST TECHNIQUE: Contiguous axial images were obtained from the base of the skull through the vertex without intravenous contrast. COMPARISON:  10/20/2020, 10/24/2020 FINDINGS: Brain: Unenhanced images of the brain again demonstrate numerous hemorrhagic metastases compatible with known metastatic melanoma. The size and number of lesions is unchanged. Diffuse areas of vasogenic edema are seen throughout the bilateral cerebral hemispheres, with effacement of the lateral ventricles right greater than left. No significant midline shift. No acute infarct. No acute extra-axial fluid collections. Vascular: No hyperdense vessel or unexpected  calcification. Skull: Normal. Negative for fracture or focal lesion. Stable postsurgical changes from prior left frontal and left occipital craniotomy. Sinuses/Orbits: No acute finding. Other: None. IMPRESSION: 1. Stable diffuse intracranial metastases compatible with metastatic melanoma. Stable vasogenic edema and effacement of the lateral ventricles. 2. No significant change since recent MRI and head CT. Electronically Signed   By: Randa Ngo M.D.   On: 10/25/2020 22:56   CT Head Wo  Contrast  Result Date: 10/20/2020 CLINICAL DATA:  Mental status change, unknown cause. EXAM: CT HEAD WITHOUT CONTRAST TECHNIQUE: Contiguous axial images were obtained from the base of the skull through the vertex without intravenous contrast. COMPARISON:  MRI head 09/30/20 FINDINGS: Brain: Interval increase in number and size of numerous supratentorial hemorrhagic metastases. The largest lesion on the left measures up to 5 point 1 cm in the left frontal lobe. The largest lesion on the right measures up to 3.3 cm in the right frontal lobe. Of note, there is a hemorrhagic lesion in the right thalamus. There is exuberant vasogenic edema associated with these lesions. No substantial midline shift. There is significant effacement of the right lateral ventricle. No hydrocephalus. Infratentorially, there is an 8 mm hemorrhagic metastasis within the superior right cerebellum. Vascular: No hyperdense vessel identified. Skull: No acute fracture. Prior left frontal and left parietal craniotomy. Sinuses/Orbits: No acute findings. Other: No mastoid effusions. IMPRESSION: Significant interval increase in number and size of numerous infratentorial and supratentorial hemorrhagic metastases with exuberant edema, as detailed above. Resulting effacement of the right lateral ventricle. An MRI with contrast could provide more complete evaluation if clinically indicated. Findings discussed with Dr. Gilford Raid Via telephone at 6:32 PM. Electronically Signed   By: Margaretha Sheffield MD   On: 10/20/2020 18:35   MR Brain W Wo Contrast  Result Date: 10/24/2020 CLINICAL DATA:  Metastatic melanoma EXAM: MRI HEAD WITHOUT AND WITH CONTRAST TECHNIQUE: Multiplanar, multiecho pulse sequences of the brain and surrounding structures were obtained without and with intravenous contrast. CONTRAST:  38mL GADAVIST GADOBUTROL 1 MMOL/ML IV SOLN COMPARISON:  None. FINDINGS: Brain: There are numerous lesions throughout the brain, many of which are new and/or  have increased in size. There are approximately 20 lesions. The largest lesions are in the left frontal operculum (3.4 x 3.0 cm, 11:15), right basal ganglia (3.5 x 2.5 cm, 11:13) and right occipital lobe (2.8 x 1.9 cm, 16:26). The lesions demonstrate intrinsic hyperintense T1-weighted signal. Beyond that, there is little demonstrated contrast enhancement. There is new mass effect on the right lateral ventricle without significant midline shift. There is moderate vasogenic edema surrounding lesions in both frontal lobes, the right basal ganglia, both occipital lobes and both parietal lobes. Vascular: Normal flow voids. Skull and upper cervical spine: Normal marrow signal. Remote left anterior and posterior craniotomies. Sinuses/Orbits: Negative. Other: None. IMPRESSION: 1. Progression of metastatic melanoma with multiple new or increased size lesions. 2. Moderate vasogenic edema surrounding lesions in both frontal lobes, right basal ganglia, both occipital lobes and both parietal lobes. 3. New mass effect on the right lateral ventricle without significant midline shift. Electronically Signed   By: Ulyses Jarred M.D.   On: 10/24/2020 03:10     ASSESSMENT AND PLAN: 1. Multiple brain metastases, unknown primary tumor site  CT brain 09/14/2020-multiple hemorrhagic metastases with surrounding edema in the bilateral cerebral hemispheres  CTs chest, abdomen, pelvis 09/14/2020-1.4 cm right upper lobe subpleural nodule, 0.5 cm right lower lobe nodule, multiple hypodensities in the liver suspicious for metastases, 1.5 cm right upper  pole hypodense kidney lesion-indeterminate  MRI abdomen 09/15/2020-multifocal T1 hyperintense liver lesions, no kidney mass  Ultrasound-guided biopsy of a segment 4B liver lesion 09/18/2020-benign liver parenchyma with steatosis, no evidence of malignancy  PET 09/25/2020- focal area of hypermetabolism in the right hilum, multiple areas of the liver, pelvic musculature, and left thigh-no  CT correlate with any of these areas on the noncontrast CT, 10 mm anterior right upper lobe nodule with low-level hypermetabolism, additional tiny pulmonary nodules too small to characterize by PET  SRS to 10 brain lesions 09/27/2020  Resection of left occipital and left frontal lesions on 09/29/2020-pathology consistent with malignant melanoma  Cycle 1 ipilimumab/nivolumab 10/16/2020  CT of the brain without contrast 10/26/2020 showed significant interval increase in the number and size of numerous metastases  Brain MRI 10/24/2020-multiple new and increased brain metastases, moderate edema surrounding multiple lesions, new mass-effect at the right lateral ventricle   2.  Left-sided weakness, new onset seizure secondary to #1, weakness has resolved 3.  Hyperlipidemia 4.  Elevated liver enzymes and bilirubin  Liver enzymes more elevated on repeat labs 09/27/2020, improved 09/28/2020 5.  Headache and nausea/vomiting secondary to #1, resolved 6.  Hospital admission 10/20/2020-encephalopathy  Mr. Meech appears stable.  He continues to have intermittent confusion.  Extensive review of the brain MRI by neuroradiology, neuro-oncology, and radiation oncology is consistent with progression of untreated brain metastases.  Treated lesions have expected posttreatment changes.  The significant progression over several weeks is unusual.  It is possible the MRI findings are in part related to hemorrhage or tumor flare from immunotherapy.  Case was reviewed with an outside oncologist who thinks changes could be related to a flare secondary to immunotherapy.  Would recommend nivolumab alone moving forward.  We will reduce his dexamethasone to 4 mg in the morning and 2 mg in the evening today.  Would like to get dexamethasone down to less than 2 mg/day prior to restarting immunotherapy.  We will plan for an MRI of the brain with and without contrast as well as CT of the chest with contrast tomorrow.  Orders have been  entered.  Recommendations: 1.  Decrease dexamethasone to 4 mg in the morning and 2 mg in the evening.   2.  MRI of the brain with and without contrast and CT of the chest with contrast on 2/16 3.  Continue therapy in CIR.   LOS: 5 days   Mikey Bussing 10/31/20 Mr. Douty was interviewed and examined.  He is more alert today, but remains confused.  He continues rehabilitation therapy.  We tapered the Decadron today.  He will be scheduled for restaging tomorrow.  I discussed the case with the neuro oncology service.  The plan is to continue a slow Decadron taper pending the brain MRI findings.  I will check on him to 11/02/2020.  Please call as needed on 11/01/2020.  I was present for greater than 50% of today's visit.  I performed medical decision making.

## 2020-10-31 NOTE — Patient Care Conference (Signed)
Inpatient RehabilitationTeam Conference and Plan of Care Update Date: 10/31/2020   Time: 10:43 AM    Patient Name: Sean [name]      Medical Record Number: [id number]  Date of Birth: 1966/06/16 Sex: Male         Room/Bed: 4W09C/4W09C-01 Payor Info: Payor: Theme park manager / Plan: [employer] OTHER / Product Type: *No Product type* /    Admit Date/Time:  10/26/2020  3:01 PM  Primary Diagnosis:  Brain tumor [facility])  Hospital Problems: Principal Problem:   Brain tumor [facility]) Active Problems:   Malignant melanoma [facility])    Expected Discharge Date: Expected Discharge Date: 11/10/20  Team Members Present: Physician leading conference: Dr. Alger Simons Care Coodinator Present: Loralee Pacas, LCSWA;Linas Stepter Creig Hines, RN, BSN, [place] Nurse Present: Dwaine Gale, RN PT Present: Tereasa Coop, PT OT Present: Cherylynn Ridges, OT SLP Present: Weston Anna, SLP PPS Coordinator present : Ileana Ladd, Burna Mortimer, SLP     Current Status/Progress Goal Weekly Team Focus  Bowel/Bladder   Continent W/incon episodes, LBM 10/30/2020  Decrease incontinent episodes  Timed toileting Q2 hr and PRN   Swallow/Nutrition/ Hydration             ADL's   Mod/max A for BADL tasks 2/2 poor attention and innitiation, Min A functional transfers  Supervision/min A  attention, awareness, innitiation, self-care retraining, cognitive training   Mobility   bed mobility with CGA to modA depending on initiation, transfers CGA to minA, gait up to 300' witih CGA, 12 steps minA with BHRs  supervision  consistent participation, improved initiation, balance, multitasking   Communication             Safety/Cognition/ Behavioral Observations  Max A  Min A  attention, initiation   Pain   No complaints of pain  Pain<3  Assess Qshift and PRN   Skin   Incision on left posterior head (closed, open to air); incision left anterior forhead (open to air)  Incision healing, prevention of infection, maintain skin  integrity for remainder of skin  Assess Qshift and PRN     Discharge Planning:  D/c to home with support from wife, and siblings who will all rotate providing care as wife works during the day.   Team Discussion: Multiple areas of insult to brain, follow up MRI this week. Complains of headaches and back aches, slow cognitively. Continent B/B, drinking well, eating well, orientation fluctuates. Home with wife, sister is primary contact. Appointment next week for infusion care. Patient on target to meet rehab goals: Slow initiation, doesn't like to follow commands. Internally and externally distracted. Moves well, cognition slow. Mod assist to initiate. Behavioral vs. Cognitive. No initiation, little verbal initiation. More cognitive than behavior.  *See Care Plan and progress notes for long and short-term goals.   Revisions to Treatment Plan:  Provide safety awareness Teaching Needs: Family education, medication management, transfer training, gait training, endurance management, initiation and cognitive training, skin/wound care.  Current Barriers to Discharge: Inaccessible home environment, Decreased caregiver support, Medical stability, Home enviroment access/layout, Wound care, Lack of/limited family support, Medication compliance, Pending chemo/radiation and Behavior  Possible Resolutions to Barriers: Continue current medications, provide nutritional support, provide emotional support to patient and family.     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 steroid with sl taper. onc/neuro-onc following. occ headaches  Barriers to Discharge: Medical stability   Possible Resolutions to [employer] Focus: daily assessment of labs and pt data. onc mgt       I attest that  I was present, lead the team conference, and concur with the assessment and plan of the team.   Cristi Loron 10/31/2020, 3:23 PM

## 2020-10-31 NOTE — Progress Notes (Signed)
Physical Therapy Session Note  Patient Details  Name: Sean [name] MRN: [id number] Date of Birth: 10/27/1965  Today's Date: 10/31/2020 PT Individual Time: 3329-5188 PT Individual Time Calculation (min): 54 min   Short Term Goals: Week 1:  PT Short Term Goal 1 (Week 1): Pt will perform bed mobility with minA. PT Short Term Goal 2 (Week 1): Pt will perform bed to chair transfer with CGA. PT Short Term Goal 3 (Week 1): Pt will ambulate x150' with CGA. PT Short Term Goal 4 (Week 1): Pt will complete x4 steps with BHRs and CGA.  Skilled Therapeutic Interventions/Progress Updates:     Pt received supine in bed. Upon PT entry into room pt very talkative but confused, mentioning that he is currently in [place], [place]. Pt is agreeable to therapy but very slow to initiate mobility. PT attempts multiple strategies to initiate pt's mobility but with little success. Eventually pt perform supine to sit with supervision for safety and cues on positioning at EOB. PT sits at EOB for several minutes prior to performing stand step transfer to [facility] with supervision and cues on positioning. WC transport to gym for time management. PT suggests boxing activity with idea that it would engage pt. Pt dons latex gloves and boxing gloves, but then sits in [facility] for extended period of time, despite repeated PT cues to initiate activity. Pt eventually stands and punches bag 4 times and then sits back down. Pt performs toss and catch activity with trampoline for standing balance and activity tolerance training, as well as coordination of L upper extremity. Pt performs majority of activity sitting down despite PT cueing pt to stand up. Pt eventually stands up and has difficulty coordinating L upper extremity for functional toss, but is able to complete several. Pt requires CGA and has x1 slight LOB when reaching for ball outside BOS.   WC transport back to room. Pt perform stand step transfer back to bed with supervision for balance  and positioning. Left supine in bed with alarm intact and all needs within reach.  Therapy Documentation Precautions:  Precautions Precautions: Fall Precaution Comments: L hemiplegia Restrictions Weight Bearing Restrictions: No    Therapy/Group: Individual Therapy  Breck Coons, PT, DPT 10/31/2020, 4:17 PM

## 2020-10-31 NOTE — Plan of Care (Signed)
  Problem: Consults Goal: RH BRAIN INJURY PATIENT EDUCATION Description: Description: See Patient Education module for eduction specifics Outcome: Progressing Goal: Skin Care Protocol Initiated - if Braden Score 18 or less Description: If consults are not indicated, leave blank or document N/A Outcome: Progressing   Problem: RH BOWEL ELIMINATION Goal: RH STG MANAGE BOWEL WITH ASSISTANCE Description: STG Manage Bowel with Supervision Assistance. Outcome: Progressing Goal: RH STG MANAGE BOWEL W/MEDICATION W/ASSISTANCE Description: STG Manage Bowel with Medication with supervision Assistance. Outcome: Progressing   Problem: RH BLADDER ELIMINATION Goal: RH STG MANAGE BLADDER WITH ASSISTANCE Description: STG Manage Bladder With supervision Assistance Outcome: Progressing   Problem: RH SKIN INTEGRITY Goal: RH STG MAINTAIN SKIN INTEGRITY WITH ASSISTANCE Description: STG Maintain Skin Integrity With supervision Assistance. Outcome: Progressing Goal: RH STG ABLE TO PERFORM INCISION/WOUND CARE W/ASSISTANCE Description: STG Able To Perform Incision/Wound Care With supervision Assistance. Outcome: Progressing   Problem: RH SAFETY Goal: RH STG ADHERE TO SAFETY PRECAUTIONS W/ASSISTANCE/DEVICE Description: STG Adhere to Safety Precautions With supervision Assistance/Device. Outcome: Progressing   Problem: RH PAIN MANAGEMENT Goal: RH STG PAIN MANAGED AT OR BELOW PT'S PAIN GOAL Description: <4 on a 0-10 pain scale. Outcome: Progressing   Problem: RH KNOWLEDGE DEFICIT BRAIN INJURY Goal: RH STG INCREASE KNOWLEDGE OF SELF CARE AFTER BRAIN INJURY Description: Patient will be able to demonstrate knowledge of medication management, dietary management, skin care management with educational materials and handouts provided by staff. Outcome: Progressing

## 2020-10-31 NOTE — Progress Notes (Signed)
Occupational Therapy Session Note  Patient Details  Name: Sean [name] MRN: [id number] Date of Birth: 16-Jul-1966  Today's Date: 10/31/2020  Session 1 OT Individual Time: 5625-6389 OT Individual Time Calculation (min): 60 min   Session 2 OT Individual Time: 1447-1530 OT Individual Time Calculation (min): 43 min    Short Term Goals: Week 1:  OT Short Term Goal 1 (Week 1): Pt will complete sit<>stand out of shower with Mod cues OT Short Term Goal 2 (Week 1): Pt will complete 1 step of UB dressing task with mod questioinong cues OT Short Term Goal 3 (Week 1): Pt will complete 1 step of tolieting task  Skilled Therapeutic Interventions/Progress Updates:  Session 1   Pt greeted semi-reclined in bed awake and alert. Pt agreeable to shower this morning. OT turned off TV and limited all external distractions. OT had to physically assist LEs off of bed to get pt to initiate bed mobility. After extended time at EOB and max multimodal cues, pt stood and ambulated into bathroom w/ CGA. Pt went to commode and stood to void bladder. OT tried to get pt to doff pants and transfer into shower, but he then ambulated back out of bathroom to sit EOB despite OT redirecting pt. After another extended rest break, pt ambulated back into bathroom to shower. Pt doffed shirt in standing with min A, but refused to doff pants and sat onto tub bench before OT could assist with doffing pants. Pt unaware that he should not have his pants on to shower and OT was unable to redirect him. Pt finally initiated stand, so OT pulled pts wet pants off. Pt would not bathe is lower body further and impulsively began ambulated out of bathroom without drying off. OT provided min A to keep him safe. Pt needed max A and max multimodal cues to don his underwear and pants. Pt given much more than reasonable amount of time and one step commands to initiate threading pants, but he sat there and shook his head. Pt allowed OT to thread pants for  him, then refused to stand. Pt would shake his head when OT attempted to assist pt into standing or when OT provided multimodal cues. Pt eventually stated "do you guys have bamboo deer out there?" Exhibiting that pt is extremely internally distracted. Pt refused to stand up and when OT asked why he did not want to stand, pt stated "because it pisses you off." OT had nurse tech enter to see if she could get him to stand. Pt declined in [place], but she raised the bed all the way up and he then initiated stand and she pulled his pants up. Pt agreeable to return to supine and left semi-reclined in bed with bed alarm on and needs met.   Session 2 Pt greeted in room all the way at the end of the bed with LLE hanging off of bed between railings. Discussed with nursing about getting Telesitter to decrease risk of falls and alert [place] if pt attempting to get OOB unassisted. Pt declined to participate in any OOB activities despite encouragement stating, "no, i'm alright." Pt agreeable to activities in the room. OT used white board in room to play cognitive and visual motor task of Tic Tac Toe. Pt able to place 2 "x"'s in appropriate boxes, then became internally distracted and was placing "x"'s all over the board. Pt needed increased time to initiate placing each "x."  OT assisted with scooting pt up in bed using trendelenburg function.  Pt was able to reach overhead with OT guiding L UE to show where to reach, then assist with pulling self back up in bed. OT provided pt with green theraband and tried to work on UB there-ex. OT demonstrated chest pull, but pt too distracted by the texture of theraband to follow commands consistently. Pt all of a sudden stated "remember yesterday when I told you I had a football player hanging out under my bed?" Pt confused and disoriented to place, time, and situation. Pt arguing with OT that he is in [place] and not in [place]. Attempted L UE there-ex with theraband and he was able to complete 2  bicep curls with minimal resistance and max multimodal cues. Pt left semi-reclined in bed with bed alarm on, call bell in reach, and needs met.   Therapy Documentation Precautions:  Precautions Precautions: Fall Precaution Comments: L hemiplegia Restrictions Weight Bearing Restrictions: No Pain:  denies pain  Therapy/Group: Individual Therapy  Valma Cava 10/31/2020, 3:30 PM

## 2020-10-31 NOTE — Progress Notes (Signed)
Physical Therapy Session Note  Patient Details  Name: Sean [name] MRN: [id number] Date of Birth: 01-19-1966  Today's Date: 10/31/2020 PT Individual Time: 1345-1415 PT Individual Time Calculation (min): 30 min   Short Term Goals: Week 1:  PT Short Term Goal 1 (Week 1): Pt will perform bed mobility with minA. PT Short Term Goal 2 (Week 1): Pt will perform bed to chair transfer with CGA. PT Short Term Goal 3 (Week 1): Pt will ambulate x150' with CGA. PT Short Term Goal 4 (Week 1): Pt will complete x4 steps with BHRs and CGA.  Skilled Therapeutic Interventions/Progress Updates:     Patient in bed upon PT arrival. Patient alert and agreeable to PT session. Patient denied pain during session.   Therapeutic Activity: Bed Mobility: Patient performed supine to/from sit with min A. Provided max verbal cues and increased time for initiation of movement. Eventually therapist offerred patient a hand to pull up to sitting and patient reached for therapist's hand and came to sitting EOB. Required max cues for placing his legs in the bed as well, required facilitation for initiation due to decreased attention to cues.  Transfers: Patient performed sit to/from stand from the bed and a standard arm chair with supervision. Provided verbal cues for safety awareness and initiation. Patient was continent of bladder in standing during toileting. Performed peri-care and lower body clothing management independently. Patient appropriately retrieved hand sanitizer after toileting without cues.   Gait Training:  Patient ambulated ~100 feet without AD with CGA-close supervision. Ambulated with variable foot placement with intermittent crossing over on L, decreased attention and awareness on R, bumping into the wall x2 with cues for avoidance from therapist, also ambulates with very slow gait speed. Provided verbal cues for visual scanning to the R, increased gait speed, and increased BOS. Patient performed 1  spontaneous standing rest break against the wall and 1 spontaneous sitting rest break in a chair by the elevators between trials.  Patient in bed at end of session with breaks locked, bed alarm set, and all needs within reach.    Therapy Documentation Precautions:  Precautions Precautions: Fall Precaution Comments: L hemiplegia Restrictions Weight Bearing Restrictions: No   Therapy/Group: Individual Therapy  Chane Magner L Manolo Bosket PT, DPT  10/31/2020, 4:33 PM

## 2020-10-31 NOTE — Progress Notes (Addendum)
PROGRESS NOTE   Subjective/Complaints: No new issues overnight. Pain remains controlled. Having intermittent h/a's  ROS: Patient denies fever, rash, sore throat, blurred vision, nausea, vomiting, diarrhea, cough, shortness of breath or chest pain,   or mood change.     Objective:   No results found. No results for input(s): WBC, HGB, HCT, PLT in the last 72 hours. No results for input(s): NA, K, CL, CO2, GLUCOSE, BUN, CREATININE, CALCIUM in the last 72 hours.  Intake/Output Summary (Last 24 hours) at 10/31/2020 0914 Last data filed at 10/31/2020 0900 Gross per 24 hour  Intake 540 ml  Output 600 ml  Net -60 ml        Physical Exam: Vital Signs Blood pressure (!) 124/93, pulse 66, temperature 98 F (36.7 C), resp. rate 18, height 5\' 11"  (1.803 m), weight 95 kg, SpO2 92 %. Constitutional: No distress . Vital signs reviewed. HEENT: EOMI, oral membranes moist Neck: supple Cardiovascular: RRR without murmur. No JVD    Respiratory/Chest: CTA Bilaterally without wheezes or rales. Normal effort    GI/Abdomen: BS +, non-tender, non-distended Ext: no clubbing, cyanosis, or edema Psych: flat, delayed Skin: intact Neuro: No focal CN abnl. Oriented to self, hospital. Follows commands. Slow to process.  RUE and RLE grossly 5/5. LUE 3+ to 4/5 with apraxia, inattention. LLE 3 to 4/5 also with apraxia, inattention. Seems to sense LT and pain in all 4's.   Musculoskeletal: Full ROM, No pain with AROM or PROM in the neck, trunk, or extremities. Posture appropriate     Assessment/Plan: 1. Functional deficits which require 3+ hours per day of interdisciplinary therapy in a comprehensive inpatient rehab setting.  Physiatrist is providing close team supervision and 24 hour management of active medical problems listed below.  Physiatrist and rehab team continue to assess barriers to discharge/monitor patient progress toward functional  and medical goals  Care Tool:  Bathing    Body parts bathed by patient: Chest,Abdomen,Face   Body parts bathed by helper: Right arm,Left arm,Front perineal area,Buttocks,Right upper leg,Left upper leg,Right lower leg,Left lower leg     Bathing assist Assist Level: Total Assistance - Patient < 25%     Upper Body Dressing/Undressing Upper body dressing Upper body dressing/undressing activity did not occur (including orthotics): N/A What is the patient wearing?: Pull over shirt    Upper body assist Assist Level: Maximal Assistance - Patient 25 - 49%    Lower Body Dressing/Undressing Lower body dressing    Lower body dressing activity did not occur: N/A What is the patient wearing?: Pants     Lower body assist Assist for lower body dressing: Total Assistance - Patient < 25%     Toileting Toileting    Toileting assist Assist for toileting: Contact Guard/Touching assist     Transfers Chair/bed transfer  Transfers assist     Chair/bed transfer assist level: Contact Guard/Touching assist     Locomotion Ambulation   Ambulation assist      Assist level: Contact Guard/Touching assist Assistive device: No Device Max distance: 150'   Walk 10 feet activity   Assist     Assist level: Contact Guard/Touching assist Assistive device: No Device   Walk  50 feet activity   Assist    Assist level: Contact Guard/Touching assist Assistive device: No Device    Walk 150 feet activity   Assist    Assist level: Contact Guard/Touching assist Assistive device: No Device    Walk 10 feet on uneven surface  activity   Assist     Assist level: Minimal Assistance - Patient > 75% Assistive device:  (handrail)   Wheelchair     Assist Will patient use wheelchair at discharge?: No             Wheelchair 50 feet with 2 turns activity    Assist            Wheelchair 150 feet activity     Assist          Blood pressure (!) 124/93,  pulse 66, temperature 98 F (36.7 C), resp. rate 18, height 5\' 11"  (1.803 m), weight 95 kg, SpO2 92 %.  Medical Problem List and Plan: 1.  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   -Follow-up oncology service Dr. Betsy Coder as well as Dr. Mickeal Skinner ongoing plan of care.  Decadron tapered to 4mg  qam and 2mg  qpm  -f/u MRI later this week per oncology             -patient may  shower             -ELOS/Goals: min Assist - 14-18 days  --Continue CIR therapies including PT, OT, and SLP   -team conference today  2.  Antithrombotics: -DVT/anticoagulation: SCDs             -antiplatelet therapy: N/A 3. Pain Management: Tylenol  And oxycodone as needed for headaches  -he used one oxycodone last night  -asked him to let us know if his pain is not controlled 4. Mood: Melatonin 5 mg nightly as needed sleep             -antipsychotic agents: N/A  -team providing ego support as needed  -sleeping better 5. Neuropsych: This patient is not capable of making decisions on his own behalf.  -begin trial of ritalin to improve attention and initiation 6. Skin/Wound Care: Routine skin checks. May removing remaining suture- will placed nursing order 7. Fluids/Electrolytes/Nutrition: pt admit that he doesn't have a great appetite  -continues to eat well    -added protein supp for low albumin 8.  Seizure prophylaxis. Continue Keppra 500 mg twice daily 9.  . Constipation- moving bowels 10. Leukocytosis: d/t steroids      LOS: 5 days A FACE TO FACE EVALUATION WAS PERFORMED  Meredith Staggers 10/31/2020, 9:14 AM

## 2020-10-31 NOTE — Progress Notes (Signed)
Patient ID: Mackie Holness, male   DOB: 1966-06-16, 55 y.o.   MRN: [id number]  SW called pt sister [name] [phone]) to provide updates from team conference. Reports she will call SW back.  *SW returned phone call to pt sister [name], [place] provided updates from team conference and pt d/c date 2/25. SW later met with pt sister in room to provide a list of times for family edu so family can decide which dates they are able to come in.   Loralee Pacas, MSW, [place] Office: [phone] Cell: [phone] Fax: [phone]

## 2020-11-01 ENCOUNTER — Inpatient Hospital Stay (HOSPITAL_COMMUNITY): Payer: 59

## 2020-11-01 IMAGING — CT CT CHEST W/ CM
2 of 4 series · 15 of 36 positions shown, 18 images · IV contrast (APPLIED)
Comparison: PET-CT [DATE].  Chest CT 12 30/21.

CLINICAL DATA: Skin cancer.  Restaging.

EXAM:
CT CHEST WITH CONTRAST
TECHNIQUE: Multidetector CT imaging of the chest was performed during
intravenous contrast administration.
CONTRAST:  75mL OMNIPAQUE IOHEXOL 300 MG/ML  SOLN

[Series 3: thorax 2.0 i31f 2 · axial · 0.88mm/px · z∈[+1193,+1501]mm · 12 of 180 slices shown, 15 images]
[im 13/180  mediastinal]
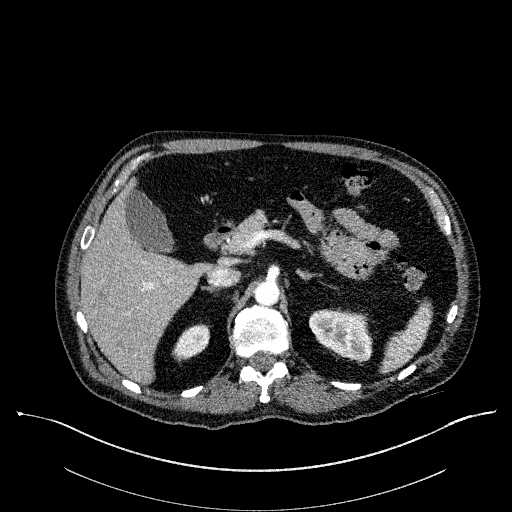
[im 13/180  lung]
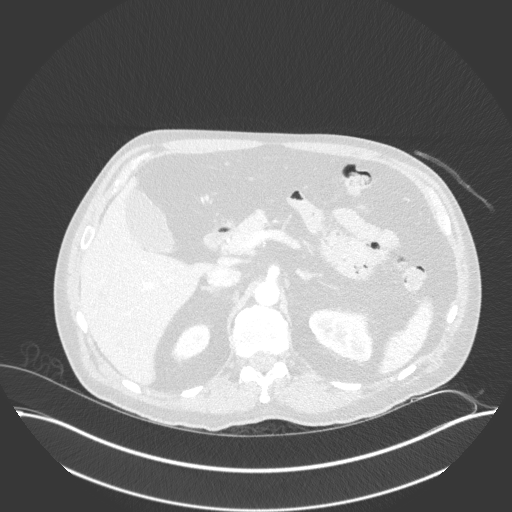
[im 26/180  lung]
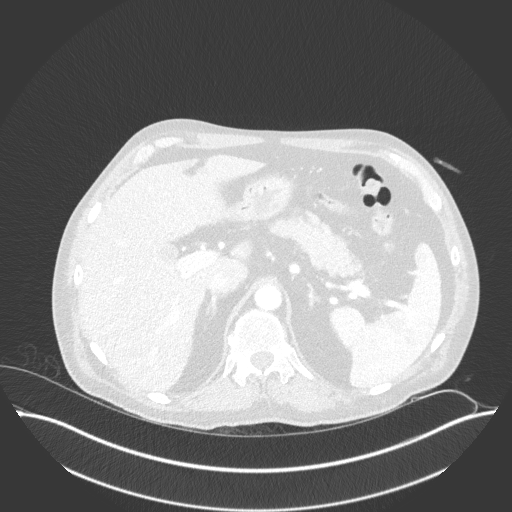
[im 39/180  lung]
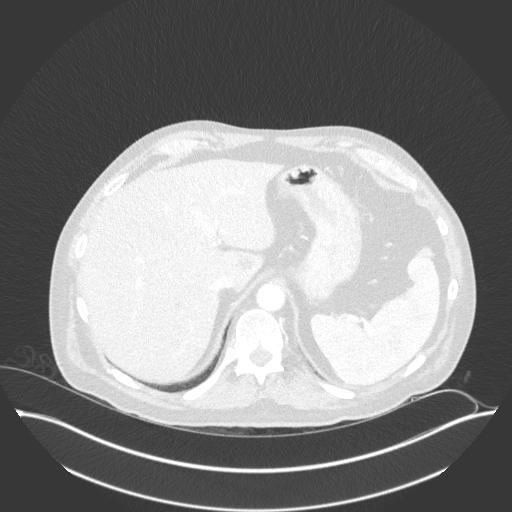
[im 52/180  lung]
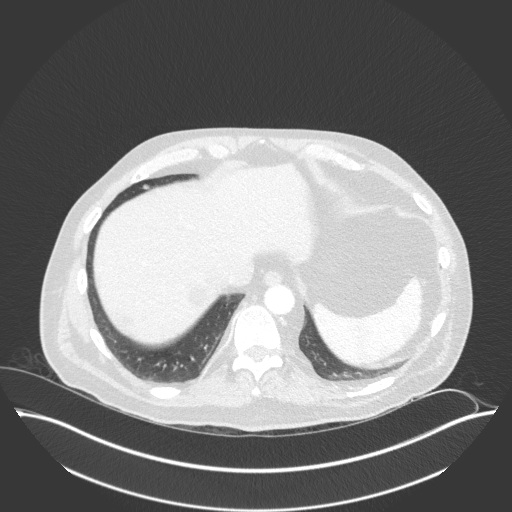
[im 64/180  mediastinal]
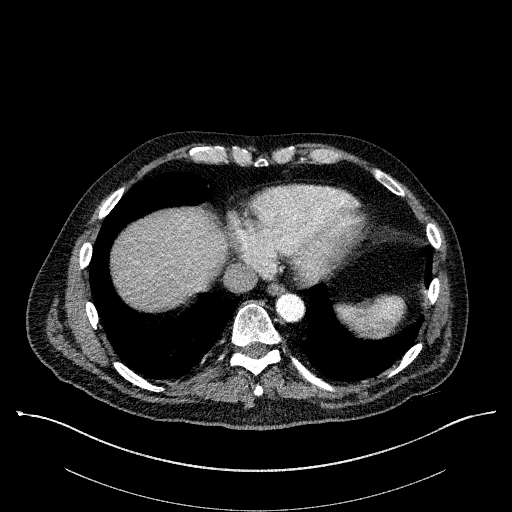
[im 64/180  lung]
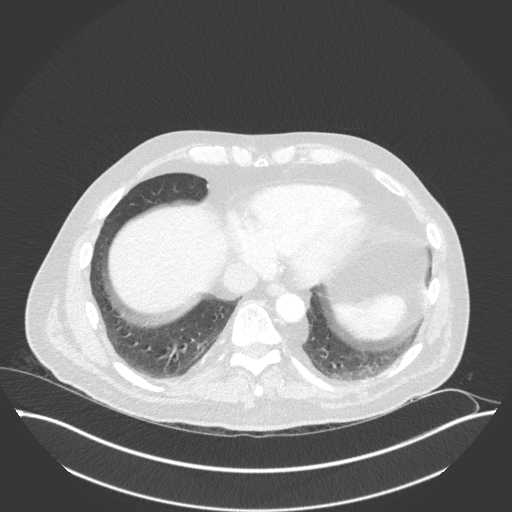
[im 77/180  lung]
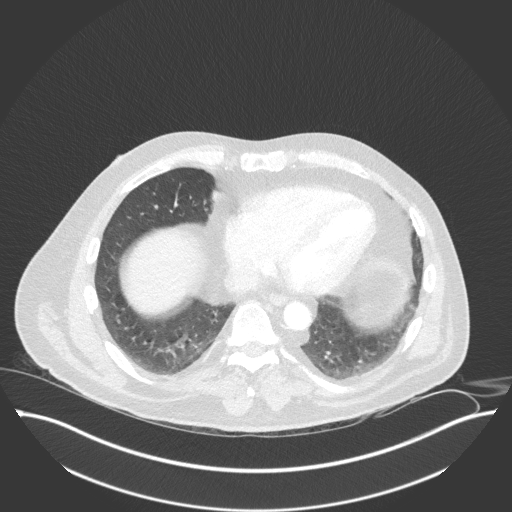
[im 103/180  lung]
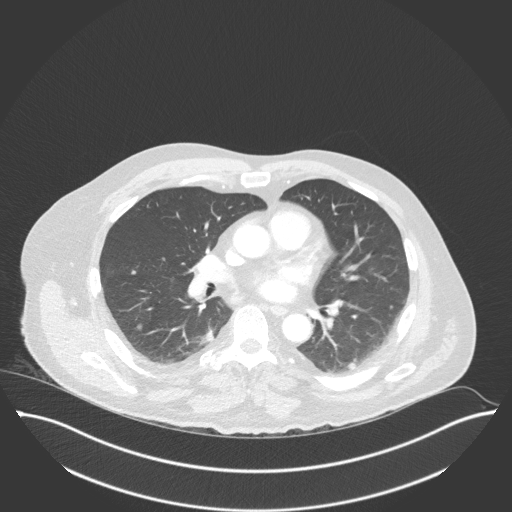
[im 116/180  lung]
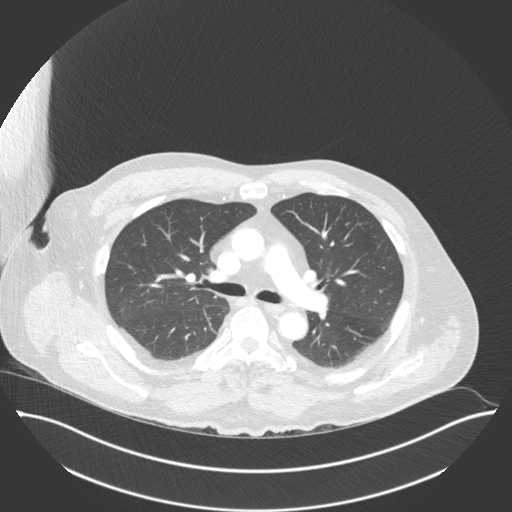
[im 128/180  mediastinal]
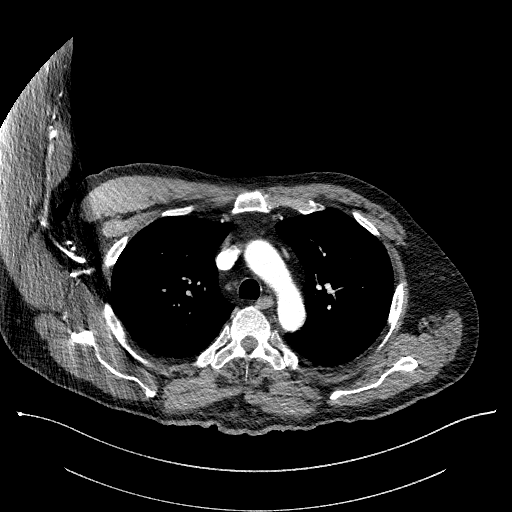
[im 128/180  lung]
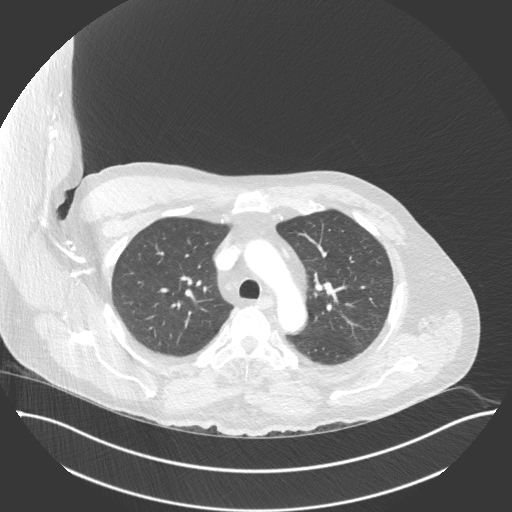
[im 141/180  lung]
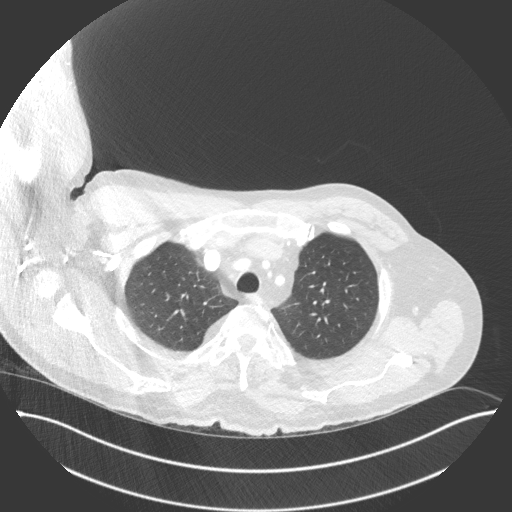
[im 154/180  lung]
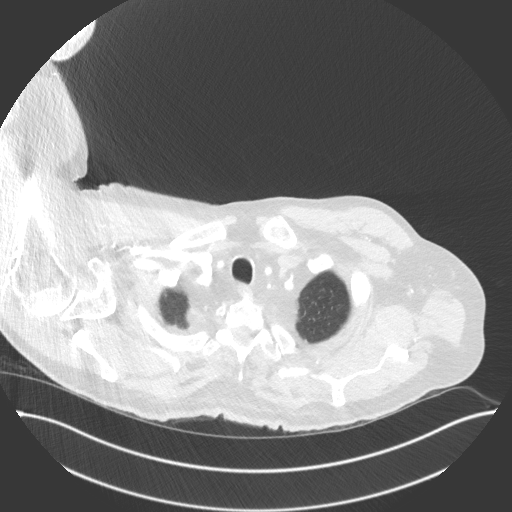
[im 167/180  lung]
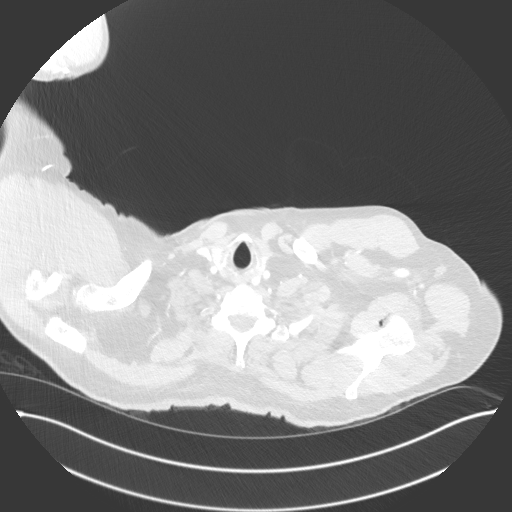

[Series 6: coronal · coronal · 0.73mm/px · 3 of 133 slices shown]
[im 27/133  lung]
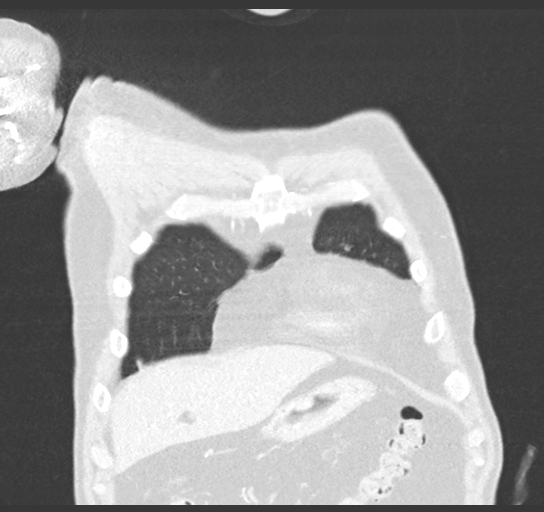
[im 53/133  lung]
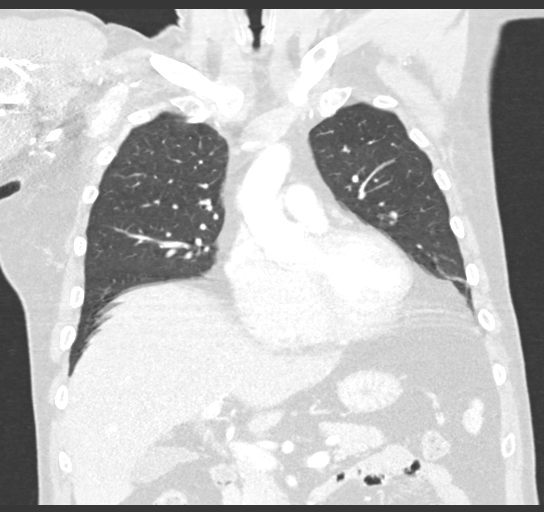
[im 80/133  lung]
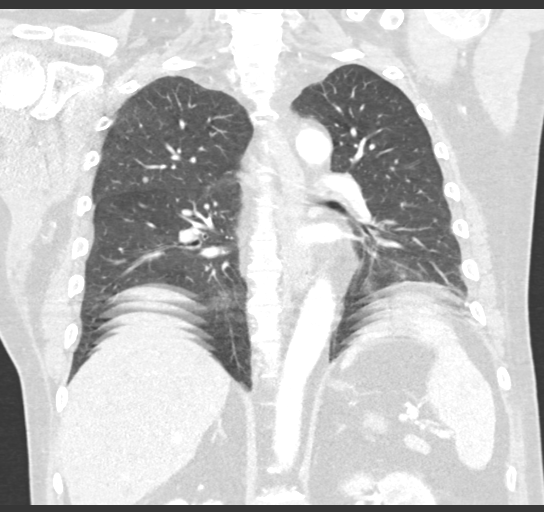

[15 of 36 positions shown; findings below may reference images not displayed]

FINDINGS: Cardiovascular: The heart size is normal. No substantial pericardial
effusion. No thoracic aortic aneurysm.

Mediastinum/Nodes: Small right paratracheal lymph nodes evident. 8
mm short axis right hilar node visible on image 70/series 3.
Immediately adjacent right hilar lymph node measures 8 mm short axis
on 72/3. No left hilar lymphadenopathy. The esophagus has normal
imaging features. There is no axillary lymphadenopathy.

Lungs/Pleura: Anterior right upper lobe pulmonary nodule measures 12
mm compared to 10 mm on previous PET-CT.

7 mm posterior right upper lobe nodule on 69/5 was 3 mm previously
(remeasured).

8 mm right lower lobe nodule on 80/5 was 4 mm previously
(remeasured).

Index nodule anterior left upper lobe on 88/5 measures 5 mm today
compared to 3 mm previously (remeasured).

Basilar atelectasis.  No pleural effusion.

Upper Abdomen: 2.6 cm low-density lesion posterior right hepatic
dome was not visible on previous PET-CT and has progressed since
abdominal MRI [DATE] when it was 1.3 cm (remeasured). 1.5 cm
caudate lesion was not visible on prior imaging. 2.2 cm inferior
right liver lesion on 163/3 measured 9 mm on previous MRI.

Musculoskeletal: Subacute posterior right tenth rib fracture noted.
IMPRESSION: 1. Interval progression of bilateral pulmonary nodules, concerning
for metastatic disease.
2. Small right hilar lymph nodes. Hypermetabolism is seen in the
right hilum on previous PET-CT. Close attention on follow-up
recommended.
3. New and progressive ill-defined lesions in the liver when
comparing to abdominal MRI of [DATE]. Imaging features
compatible with progression of metastatic involvement.

## 2020-11-01 IMAGING — MR MR HEAD WO/W CM
11 of 14 series · 23 of 48 positions shown · IV contrast (cc gad)
Comparison: [DATE]

CLINICAL DATA: Metastatic melanoma

EXAM:
MRI HEAD WITHOUT AND WITH CONTRAST
TECHNIQUE: Multiplanar, multiecho pulse sequences of the brain and surrounding
structures were obtained without and with intravenous contrast.
CONTRAST:  9mL GADAVIST GADOBUTROL 1 MMOL/ML IV SOLN

[Series 3: FLAIR · sagittal · 3.0mm · 0.51mm/px · 2 of 49 slices shown (1 of 2)]
[im 1/49]
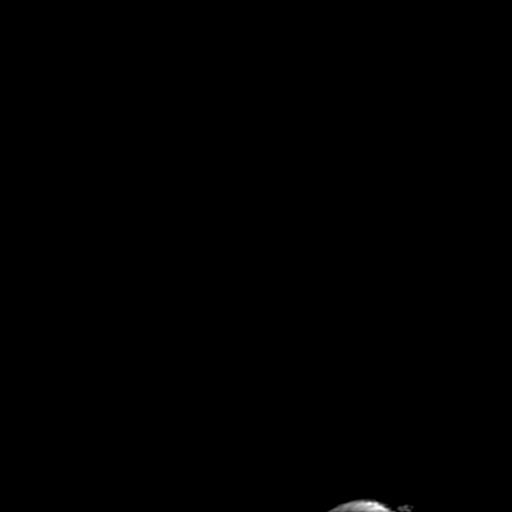
[im 49/49]
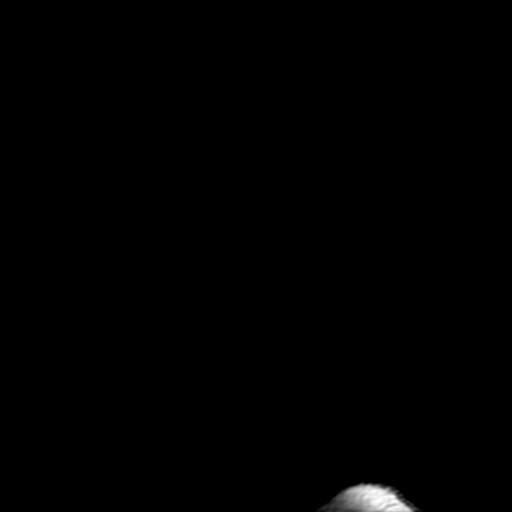

[Series 4: DWI · axial · 3.0mm · 0.94mm/px · z∈[-81,+92]mm · 4 of 118 slices shown]
[im 1/118]
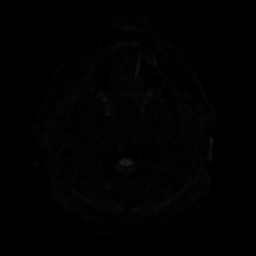
[im 40/118]
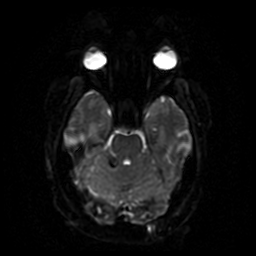
[im 79/118]
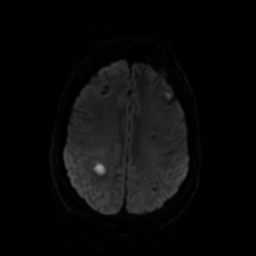
[im 118/118]
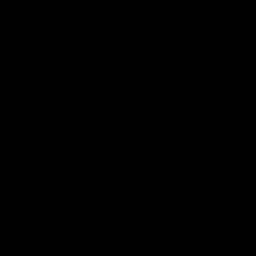

[Series 5: FLAIR · axial · 3.0mm · 0.51mm/px · 1 of 59 slices shown (2 of 2)]
[im 1/59]
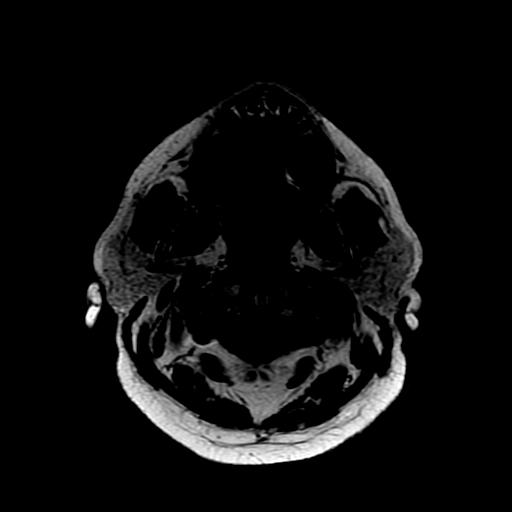

[Series 7: SWI · axial · 3.0mm · 0.47mm/px · z∈[-82,+93]mm · 3 of 118 slices shown (1 of 2)]
[im 1/118]
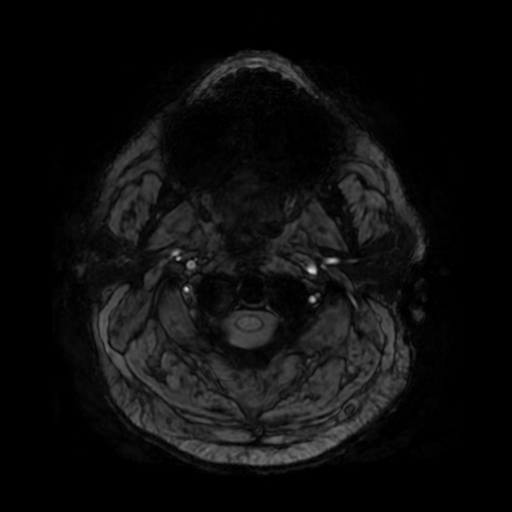
[im 59/118]
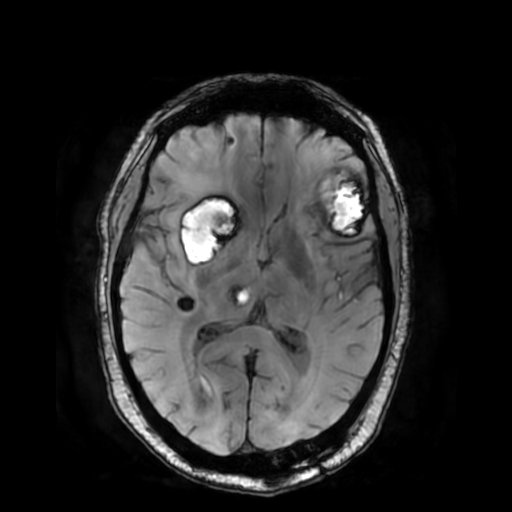
[im 118/118]
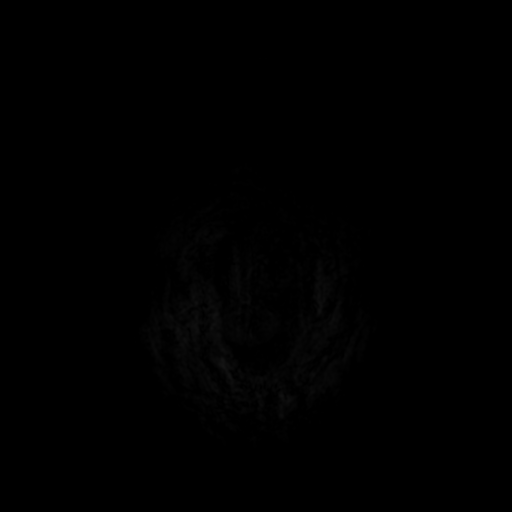

[Series 8: T2 post-contrast · coronal · 3.0mm · 0.39mm/px · 1 of 59 slices shown (1 of 2)]
[im 1/59]
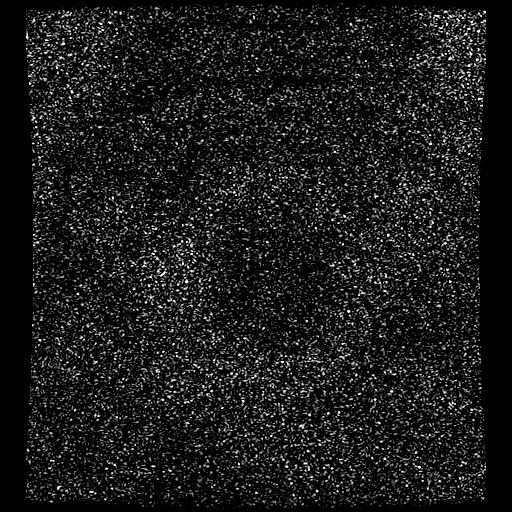

[Series 9: T2 post-contrast · axial · 5.0mm · 0.47mm/px · 1 of 30 slices shown (2 of 2)]
[im 1/30]
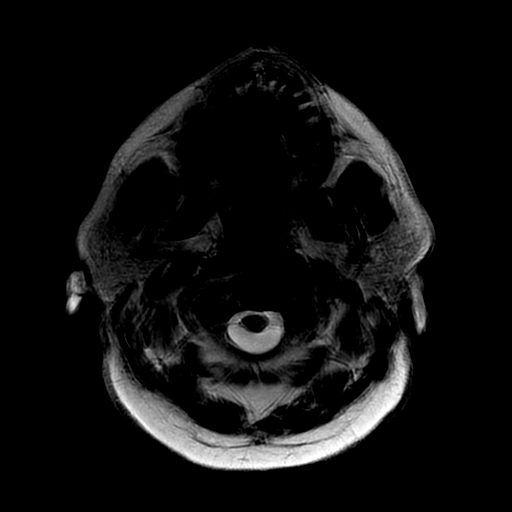

[Series 10: T1 post-contrast · coronal · 3.0mm · 0.39mm/px · 1 of 59 slices shown (1 of 2)]
[im 1/59]
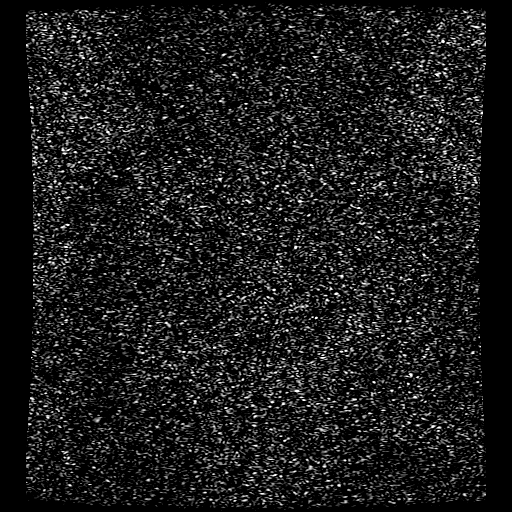

[Series 11: FLAIR post-contrast · sagittal · 3.0mm · 0.51mm/px · 1 of 49 slices shown]
[im 1/49]
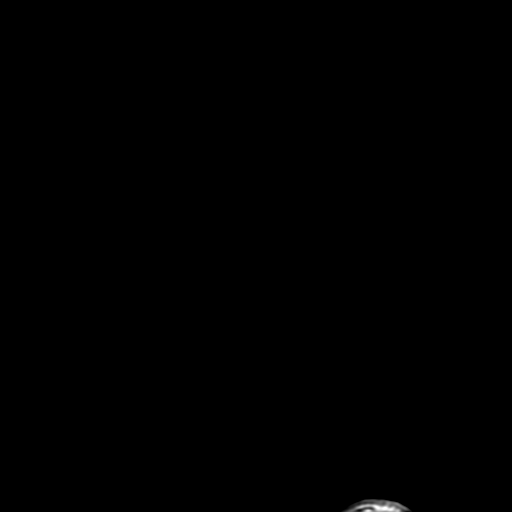

[Series 450: ADC · axial · 3.0mm · 0.94mm/px · 1 of 59 slices shown]
[im 1/59]
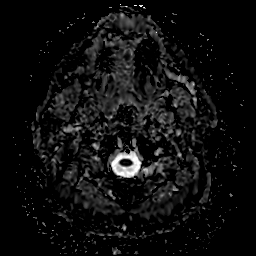

[Series 700: SWI · axial · 3.0mm · 0.47mm/px · 1 of 118 slices shown (2 of 2)]
[im 1/118]
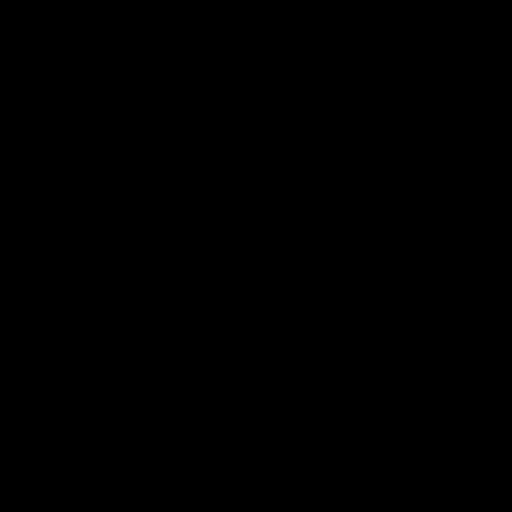

[Series 1200: T1 post-contrast · axial · 0.9mm · 0.50mm/px · z∈[-129,+127]mm · 7 of 302 slices shown (2 of 2)]
[im 1/302]
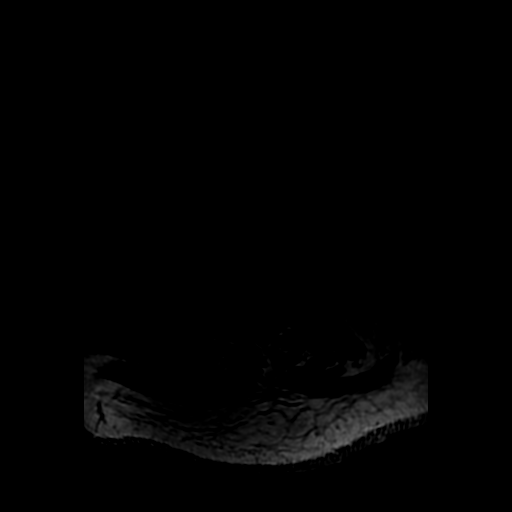
[im 51/302]
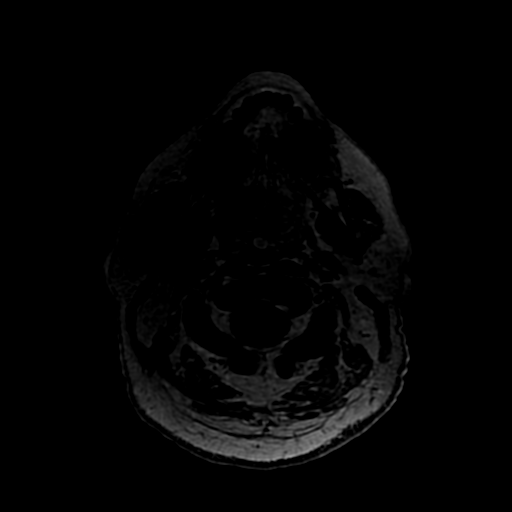
[im 101/302]
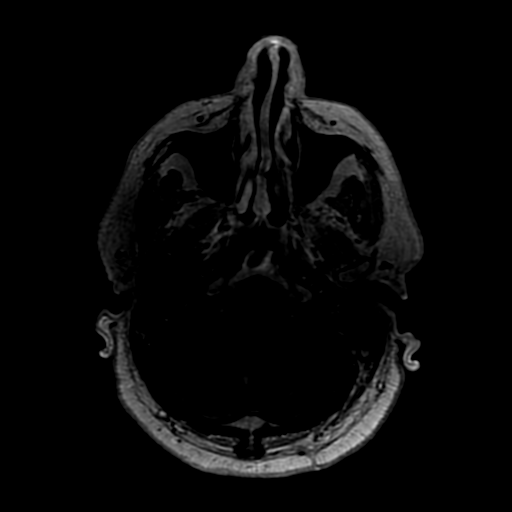
[im 151/302]
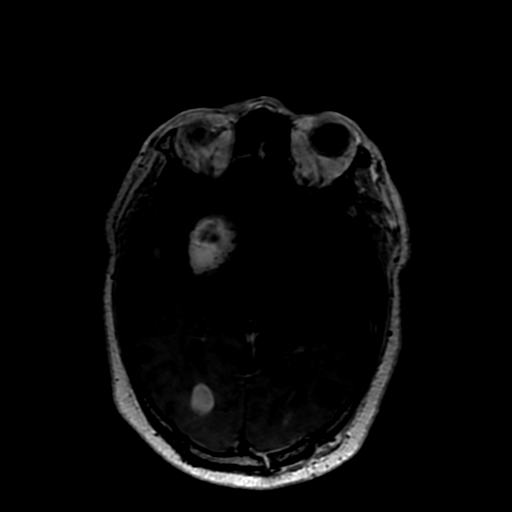
[im 201/302]
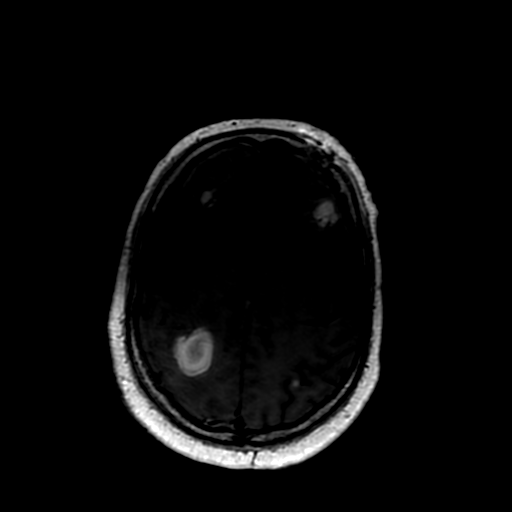
[im 251/302]
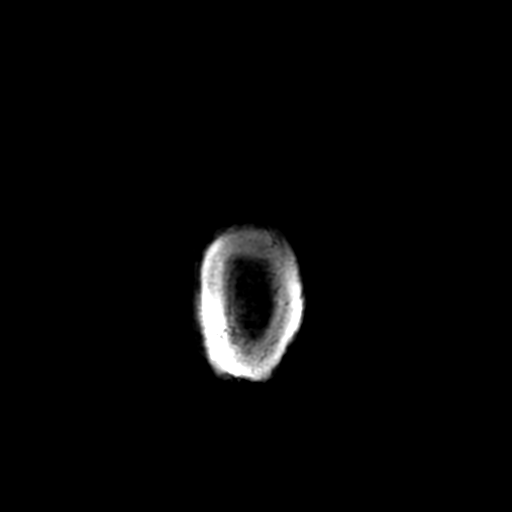
[im 302/302]
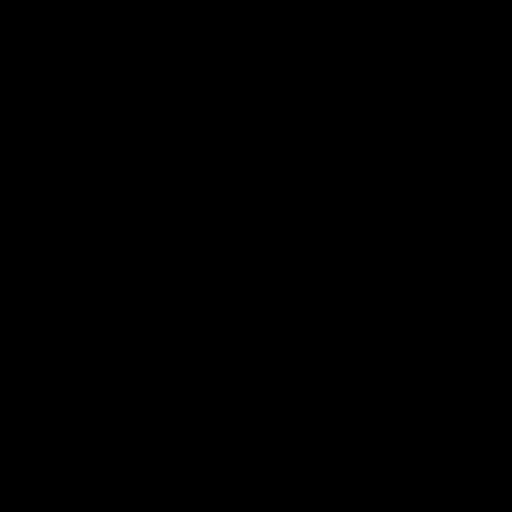

[23 of 48 positions shown; findings below may reference images not displayed]

FINDINGS: Brain: Numerous intrinsically T1 hyperintense lesions are again
identified throughout the brain. As before, there are approximately
20 lesions. No substantial change in size over the short interval.
Associated edema is similar. As before, there is partial effacement
the right lateral ventricle with mild leftward midline shift. No
hydrocephalus.

No acute infarction. Susceptibility associated with the metastatic
lesions.

Vascular: Major vessel flow voids at the skull base are preserved.

Skull and upper cervical spine: Prior craniotomies. Normal marrow
signal is preserved.

Sinuses/Orbits: Paranasal sinuses are aerated. Orbits are
unremarkable.

Other: Sella is unremarkable.  Mastoid air cells are clear.
IMPRESSION: Stable appearance multiple intracranial metastases and associated
edema and mild mass effect.

## 2020-11-01 MED ORDER — GADOBUTROL 1 MMOL/ML IV SOLN
9.0000 mL | Freq: Once | INTRAVENOUS | Status: AC | PRN
  Administered 2020-11-01: 9 mL via INTRAVENOUS

## 2020-11-01 MED ORDER — IOHEXOL 300 MG/ML  SOLN
75.0000 mL | Freq: Once | INTRAMUSCULAR | Status: AC | PRN
  Administered 2020-11-01: 75 mL via INTRAVENOUS

## 2020-11-01 NOTE — Progress Notes (Signed)
Physical Therapy Session Note  Patient Details  Name: Sean [name] MRN: [id number] Date of Birth: 01/17/1966  Today's Date: 11/01/2020 PT Individual Time: 0804-0900 PT Individual Time Calculation (min): 56 min   Short Term Goals: Week 1:  PT Short Term Goal 1 (Week 1): Pt will perform bed mobility with minA. PT Short Term Goal 2 (Week 1): Pt will perform bed to chair transfer with CGA. PT Short Term Goal 3 (Week 1): Pt will ambulate x150' with CGA. PT Short Term Goal 4 (Week 1): Pt will complete x4 steps with BHRs and CGA.  Skilled Therapeutic Interventions/Progress Updates:     Pt received supine in bed and agrees to therapy. Continues to say that he is in [place], [place], despite redirection from PT. Supine to sit with bed features and increased time to complete. Pt performs sit to stand with CGA and PT utilizing tactile and verbal cues for initiation. Stand step transfer to [facility] with CGA and cues on positioning.   Pt steps up onto treadmill with CGA to perform ambulation with litegait for body weight support and safety. Pt ambulates following bouts:  3:52 at 1.0 mph for 308'. PT places markers for pt to step over to encourage increased bilateral stride length L>R. Pt demos improved gait pattern relative to previous litegait trial.  3:40 at 0.70mph at incline of 3 for 257'. Performed on incline to increase endurance challenge and to encourage increased step height and dorsiflexion, bilaterally. Pt completes with physical assistance from PT.  Pt steps down from treadmill with minA and verbal cues required to sequence transfer back to WC. Pt left seated in WC with alarm intact and all needs within reach.   Therapy Documentation Precautions:  Precautions Precautions: Fall Precaution Comments: L hemiplegia Restrictions Weight Bearing Restrictions: No   Therapy/Group: Individual Therapy  Breck Coons, PT, DPT 11/01/2020, 4:39 PM

## 2020-11-01 NOTE — Progress Notes (Signed)
Speech Language Pathology Daily Session Note  Patient Details  Name: Sean [name] MRN: [id number] Date of Birth: 09/30/1965  Today's Date: 11/01/2020 SLP Individual Time: 1000-1045 SLP Individual Time Calculation (min): 45 min and Today's Date: 11/01/2020 SLP Missed Time: 15 Minutes Missed Time Reason:  (IV team for CT scan)  Short Term Goals: Week 1: SLP Short Term Goal 1 (Week 1): Patient will initaite functional tasks with Mod A multimodal cues in 50% of opportunities SLP Short Term Goal 2 (Week 1): Patient will verbally respond to questions in 50% of opportunities with Mod A multimodal cues. SLP Short Term Goal 3 (Week 1): Patient will demonstrate orientation to place, time and situation with Mod A multimodal cues. SLP Short Term Goal 4 (Week 1): Patient will demonstrate sustained attention to functional tasks for 5 minutes with Mod A multimodal cues for redirection. SLP Short Term Goal 5 (Week 1): Patient will demonstrate functional problem solving for basic and familiar tasks with Mod A multimodal cues.  Skilled Therapeutic Interventions: Skilled treatment session focused on cognitive goals. Upon arrival, patient was supine in bed. SLP facilitated session by providing extra time and Mod tactile cues for patient to initiate sitting EOB. Patient donned his shoes with overall Min A verbal cues and required extra time to initiate standing for transfer to the wheelchair. Patient was oriented to month and hospital but required total A for orientation to city and state. SLP created visual aids for orientation which patient was able to utilize when written in large print as patient reports his vision appears blurry. SLP also attempted functional problem solving with a basic money management task that the patient did not complete because, "he didn't feel like it." Attempted to engage patient in conversation and other functional tasks without success. IV team arrived and needed to place IV for upcoming  CT, therefore, patient transferred back to bed with extra time and Min verbal and tactile cues. Patient missing remaining 15 minutes of session. Continue with current plan of care.      Pain No/Denies Pain   Therapy/Group: Individual Therapy  Tekisha Darcey 11/01/2020, 2:55 PM

## 2020-11-01 NOTE — Progress Notes (Signed)
Occupational Therapy Session Note  Patient Details  Name: Sean [name] MRN: [id number] Date of Birth: Mar 15, 1966  Today's Date: 11/01/2020 OT Individual Time: 1350-1500 OT Individual Time Calculation (min): 70 min    Short Term Goals: Week 1:  OT Short Term Goal 1 (Week 1): Pt will complete sit<>stand out of shower with Mod cues OT Short Term Goal 2 (Week 1): Pt will complete 1 step of UB dressing task with mod questioinong cues OT Short Term Goal 3 (Week 1): Pt will complete 1 step of tolieting task  Skilled Therapeutic Interventions/Progress Updates:    1;1. Pt received in bed agreeable to OT with NT present needing to change bed sheets as pt soiled with urine. Pt completes all transfers with MIN A and significant increased time for initiation. Pt dons shirt with MOD A threading 1UE and head, requiring A to thread LUE/pull down back. Pt unable to attend to thread BLE into pants as NT changing bed sheets and pt very distracted. Pt completes sit to stand with CGA to pull up pants. Pt completes self feeding with MIN intervention to decrease rate of self feeding. Pt sits in tx gym in moderately busy environment attempting to match playing cards on vertical board. Pt able to complete 5 cards with 1-4 min in between sorting. Exited session with pt seated in bed, exit alarm on and call light in reach    Therapy Documentation Precautions:  Precautions Precautions: Fall Precaution Comments: L hemiplegia Restrictions Weight Bearing Restrictions: No General:   Vital Signs: Therapy Vitals Temp: 98.1 F (36.7 C) Temp Source: Oral Pulse Rate: 75 Resp: 16 BP: 115/82 Patient Position (if appropriate): Lying Oxygen Therapy SpO2: 99 % O2 Device: Room Air Pain:   ADL: ADL Eating: Maximal assistance,Maximal cueing Grooming: Maximal assistance,Maximal cueing Upper Body Bathing: Maximal assistance,Maximal cueing Lower Body Bathing: Maximal assistance,Maximal cueing Upper Body Dressing:  Maximal assistance,Maximal cueing Lower Body Dressing: Maximal assistance,Maximal cueing Toilet Transfer: Minimal assistance Gaffer Transfer: Minimal assistance Vision   Perception    Praxis   Exercises:   Other Treatments:     Therapy/Group: Individual Therapy  Tonny Branch 11/01/2020, 6:55 AM

## 2020-11-01 NOTE — Consult Note (Signed)
Neuropsychological Consultation   Patient:   Sean [name]   DOB:   1965/12/01  MR Number:  [id number]  Location:  [facility] [id number] [place] [address] Dept: [place]: [phone]           Date of Service:   11/01/2020  Start Time:   3 PM End Time:   4 PM  Provider/Observer:  Ilean Skill, Psy.D.       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  Patient presented on 10/20/2020 with right-sided weakness and aphasia as well as fatigue over the prior 4 days.  Head CT showed significant interval increase in number and size of numerous infratentorial and supratentorial hemorrhagic metastasis with significant edema.  MRI abdomen also had shown liver lesions but no kidney mass.  Multiple abdominal metastasis and brain metastasis are noted.  There have been multiple new and increased size lesions noted.  Therapy evaluations noted decline in ADLs and mobility and patient was admitted to the comprehensive inpatient unit.  Reason for Service:  Patient was referred for neuropsychological consultation due to coping and adjustment as well as cognitive changes due to malignant melanoma that has metastasized and numerous GI locations as well as numerous brain metastasis.  Patient has had significant episodes of worsening cognition and confusion with some improvement but overall progressive deterioration cognitively.  Below is the HPI for the current admission.  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 Betsy Coder of oncology services and  recently placed on dexamethasone with slow taper.  Per chart review lives with spouse.  1 level home 2 steps to entry.  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  Good family support.  Presented 10/20/2020 with right side weakness and aphasia as well as fatigue x4 days.  Admission chemistries unremarkable aside glucose 104, ALT 48, hemoglobin 15, urine culture multiple species, lactic acid 0.9.  CT of the head showed significant interval increase in number and size of numerous infratentorial and supratentorial hemorrhagic metastasis with exuberant edema.  Resulting effacement of the right lateral ventricle.  Recent MRI of abdomen 09/15/2020 multifocal T1 hyperintense liver lesions, no kidney mass.  He underwent ultrasound-guided biopsy of a segment 4B liver lesion 09/18/2020 benign liver parenchyma with steatosis, no evidence of malignancy.  PET 09/25/2020 focal area of hypermetabolism in the right hilum, multiple areas of the liver, pelvic musculature, and left thigh-no CT correlate with any of these areas on the noncontrast CT, 10 mm anterior right upper lobe nodule with low-level hypermetabolism, additional tiny pulmonary nodules too small to characterize by PET.  Underwent resection of left occipital left frontal lesions 09/29/2020 pathology consistent with malignant melanoma.  MRI of the brain completed 10/24/2020 shows progression of metastatic melanoma with multiple new or increased size lesions.  Moderate vasogenic edema surrounding lesions in both frontal lobes right basal ganglia both occipital lobes and both parietal lobes.  New mass-effect on the right lateral ventricle without significant midline shift.  Oncology follow-up Dr. Alvy Bimler as well as follow-up Dr. Mickeal Skinner and remains on high-dose intravenous Decadron.  Keppra initiated for  CVA prophylaxis.  Palliative care consulted to establish goals of care.  Patient tolerating a regular consistency diet.  Therapy evaluations  completed due to decline in ADLs and mobility patient was admitted for a comprehensive rehab program.  Current Status:  Patient was laying in his bed slightly elevated when I entered the room.  He was awake and alert but there were clear limitations to cognition and the patient generally answered specific questions being asked and vague nonspecific answers but generated no questions or self-directed activity.  Patient was generally aware of what had been going on with the medically and was oriented to person place and situation.  However, there were clear cognitive deficits consistent with lesions in both frontal lobes affecting initiation but expressive language has improved from his most significant status.  Behavioral Observation: Sean [name]  presents as a 55 y.o.-year-old Right Caucasian Male who appeared his stated age. his dress was Appropriate and he was Well Groomed and his manners were Appropriate to the situation.  his participation was indicative of Appropriate, Inattentive and Redirectable behaviors.  There were physical disabilities noted.  he displayed an appropriate level of cooperation and motivation.     Interactions:    Active Appropriate and Inattentive  Attention:   abnormal and attention span appeared shorter than expected for age  Memory:   abnormal; remote memory intact, recent memory impaired  Visuo-spatial:  not examined  Speech (Volume):  low  Speech:   normal; non-fluent aphasia with improvement recently  Thought Process:  Coherent and Circumstantial  Though Content:  WNL; not suicidal and not homicidal  Orientation:   person, place and situation  Judgment:   Fair  Planning:   Poor  Affect:    Blunted and Flat  Mood:    Dysphoric  Insight:   Fair  Intelligence:   normal  Medical History:   Past Medical History:  Diagnosis Date  . Brain lesion 09/14/2020  . Hyperlipidemia   . Liver lesion 09/14/2020         Patient Active Problem List    Diagnosis Date Noted  . Malignant melanoma ([facility]) 10/26/2020  . Brain metastasis ([place]) 10/20/2020  . Melanoma of skin ([facility]) 10/05/2020  . Status post craniotomy 09/29/2020  . Brain tumor ([facility]) 09/29/2020  . Elevated blood pressure reading 09/15/2020  . Malignant melanoma metastatic to brain ([facility]) 09/15/2020  . Uncomplicated alcohol dependence ([facility])   . Seizure ([facility])   . Hypokalemia   . Hyponatremia   . Elevated LFTs   . Brain metastases ([facility]) 09/14/2020    Psychiatric History:  No prior psychiatric history but patient has been going through significant medical complications due to malignant melanoma that has metastasized to the brain as well as numerous other metastasis sites.  Family Med/Psych History:  Family History  Problem Relation Age of Onset  . Osteoarthritis Mother   . Hypertension Father   . Colon polyps Father   . Colon polyps Brother   . Colon polyps Brother   . Colon cancer Neg Hx   . Stomach cancer Neg Hx   . Rectal cancer Neg Hx   . Esophageal cancer Neg Hx   . 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  Patient presented on 10/20/2020 with right-sided weakness and aphasia as well as fatigue over the prior 4 days.  Head CT  showed significant interval increase in number and size of numerous infratentorial and supratentorial hemorrhagic metastasis with significant edema.  MRI abdomen also had shown liver lesions but no kidney mass.  Multiple abdominal metastasis and brain metastasis are noted.  There have been multiple new and increased size lesions noted.  Therapy evaluations noted decline in ADLs and mobility and patient was admitted to the comprehensive inpatient unit.  Patient was laying in his bed slightly elevated when I entered the room.  He was awake and alert but there were clear limitations to cognition and  the patient generally answered specific questions being asked and vague nonspecific answers but generated no questions or self-directed activity.  Patient was generally aware of what had been going on with the medically and was oriented to person place and situation.  However, there were clear cognitive deficits consistent with lesions in both frontal lobes affecting initiation but expressive language has improved from his most significant status.   Disposition/Plan:  The patient engaged in very little self-directed questions or activities but was able to understand questions and provide nonspecific vague limited answers.  The patient is aware of his medical status in general if not specific detail.  Worked on coping and adjustment issues and patient's ability to engage in treatment goals going forward.  At this point, he does appear to be competent to be a participant in these treatment goals going forward and palliative care will remain involved.  Diagnosis:    Brain tumor ([facility]) - Plan: dexamethasone (DECADRON) tablet 4 mg, dexamethasone (DECADRON) tablet 2 mg         Electronically Signed   _______________________ Ilean Skill, Psy.D. Clinical Neuropsychologist

## 2020-11-01 NOTE — Progress Notes (Signed)
PROGRESS NOTE   Subjective/Complaints: No complaints this morning Headaches well controlled No issues overnight.   ROS: Patient denies fever, rash, sore throat, blurred vision, nausea, vomiting, diarrhea, cough, shortness of breath or chest pain,   or mood change.     Objective:   No results found. No results for input(s): WBC, HGB, HCT, PLT in the last 72 hours. No results for input(s): NA, K, CL, CO2, GLUCOSE, BUN, CREATININE, CALCIUM in the last 72 hours.  Intake/Output Summary (Last 24 hours) at 11/01/2020 1131 Last data filed at 11/01/2020 0800 Gross per 24 hour  Intake 840 ml  Output -  Net 840 ml        Physical Exam: Vital Signs Blood pressure 115/82, pulse 75, temperature 98.1 F (36.7 C), temperature source Oral, resp. rate 16, height 5\' 11"  (1.803 m), weight 95 kg, SpO2 99 %. Gen: no distress, normal appearing HEENT: oral mucosa pink and moist, NCAT Cardio: Reg rate Chest: normal effort, normal rate of breathing Abd: soft, non-distended Ext: no edema Psych: flat, delayed Skin: intact Neuro: No focal CN abnl. Oriented to self, hospital. Follows commands. Slow to process.  RUE and RLE grossly 5/5. LUE 3+ to 4/5 with apraxia, inattention. LLE 3 to 4/5 also with apraxia, inattention. Seems to sense LT and pain in all 4's.   Musculoskeletal: Full ROM, No pain with AROM or PROM in the neck, trunk, or extremities. Posture appropriate     Assessment/Plan: 1. Functional deficits which require 3+ hours per day of interdisciplinary therapy in a comprehensive inpatient rehab setting.  Physiatrist is providing close team supervision and 24 hour management of active medical problems listed below.  Physiatrist and rehab team continue to assess barriers to discharge/monitor patient progress toward functional and medical goals  Care Tool:  Bathing    Body parts bathed by patient: Chest,Abdomen,Face   Body parts  bathed by helper: Right arm,Left arm,Front perineal area,Buttocks,Right upper leg,Left upper leg,Right lower leg,Left lower leg     Bathing assist Assist Level: Total Assistance - Patient < 25%     Upper Body Dressing/Undressing Upper body dressing Upper body dressing/undressing activity did not occur (including orthotics): N/A What is the patient wearing?: Pull over shirt    Upper body assist Assist Level: Maximal Assistance - Patient 25 - 49%    Lower Body Dressing/Undressing Lower body dressing    Lower body dressing activity did not occur: N/A What is the patient wearing?: Pants     Lower body assist Assist for lower body dressing: Total Assistance - Patient < 25%     Toileting Toileting    Toileting assist Assist for toileting: Contact Guard/Touching assist     Transfers Chair/bed transfer  Transfers assist     Chair/bed transfer assist level: Supervision/Verbal cueing     Locomotion Ambulation   Ambulation assist      Assist level: Contact Guard/Touching assist Assistive device: No Device Max distance: 150'   Walk 10 feet activity   Assist     Assist level: Contact Guard/Touching assist Assistive device: No Device   Walk 50 feet activity   Assist    Assist level: Contact Guard/Touching assist Assistive device: No  Device    Walk 150 feet activity   Assist    Assist level: Contact Guard/Touching assist Assistive device: No Device    Walk 10 feet on uneven surface  activity   Assist     Assist level: Minimal Assistance - Patient > 75% Assistive device:  (handrail)   Wheelchair     Assist Will patient use wheelchair at discharge?: No             Wheelchair 50 feet with 2 turns activity    Assist            Wheelchair 150 feet activity     Assist          Blood pressure 115/82, pulse 75, temperature 98.1 F (36.7 C), temperature source Oral, resp. rate 16, height 5\' 11"  (1.803 m), weight 95 kg,  SpO2 99 %.  Medical Problem List and Plan: 1.  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   -Follow-up oncology service Dr. Betsy Coder as well as Dr. Mickeal Skinner ongoing plan of care.  Decadron tapered to 4mg  qam and 2mg  qpm  -f/u MRI later this week per oncology             -patient may  shower             -ELOS/Goals: min Assist - 14-18 days  --Continue CIR therapies including PT, OT, and SLP   2.  Antithrombotics: -DVT/anticoagulation: SCDs             -antiplatelet therapy: N/A 3. Pain Management: Continue Tylenol and oxycodone as needed for headaches, well controlled on 2/16.   -he used one oxycodone last night  -asked him to let us know if his pain is not controlled 4. Mood: Continue Melatonin 5 mg nightly as needed sleep             -antipsychotic agents: N/A  -team providing ego support as needed  -sleeping better 5. Neuropsych: This patient is not capable of making decisions on his own behalf.  -begin trial of ritalin to improve attention and initiation 6. Skin/Wound Care: Routine skin checks. May removing remaining suture- will placed nursing order 7. Fluids/Electrolytes/Nutrition: pt admit that he doesn't have a great appetite  -continues to eat well    -added protein supp for low albumin 8.  Seizure prophylaxis. Conitnue Keppra 500 mg twice daily 9.  . Constipation- moving bowels 10. Leukocytosis: d/t steroids      LOS: 6 days A FACE TO FACE EVALUATION WAS PERFORMED  Clide Deutscher Jacci Ruberg 11/01/2020, 11:31 AM

## 2020-11-02 ENCOUNTER — Telehealth: Payer: Self-pay | Admitting: *Deleted

## 2020-11-02 MED ORDER — METHYLPHENIDATE HCL 5 MG PO TABS
10.0000 mg | ORAL_TABLET | Freq: Two times a day (BID) | ORAL | Status: DC
  Administered 2020-11-02 – 2020-11-07 (×10): 10 mg via ORAL
  Filled 2020-11-02 (×10): qty 2

## 2020-11-02 NOTE — Progress Notes (Signed)
Occupational Therapy Session Note  Patient Details  Name: Sean [name] MRN: [id number] Date of Birth: 1965-12-29  Today's Date: 11/02/2020 OT Individual Time: 4680-3212 OT Individual Time Calculation (min): 10 min  and Today's Date: 11/02/2020 OT Missed Time: 50 Minutes Missed Time Reason: Patient unwilling/refused to participate without medical reason   Short Term Goals: Week 1:  OT Short Term Goal 1 (Week 1): Pt will complete sit<>stand out of shower with Mod cues OT Short Term Goal 2 (Week 1): Pt will complete 1 step of UB dressing task with mod questioinong cues OT Short Term Goal 3 (Week 1): Pt will complete 1 step of tolieting task  Skilled Therapeutic Interventions/Progress Updates:    Pt greeted semi-reclined in bed, awake and watching TV. OT turned off TV to limit distractions. OT tried to engage patient in [place] activities, offered shower, toileting, change of clothes, but pt kept shaking head no when OT tried to get him to sit up. Pt would move B LEs back into bed when OT physically tried to assist. OT provided pt with wash cloth to wash his face, and he threw it on the floor despite max multimodal cues. OT offered pt toothbrushing in bed, but he declined to participate in that as well. Pt missed 50 minutes of skilled treatment session 2/2 refusal.   Therapy Documentation Precautions:  Precautions Precautions: Fall Precaution Comments: L hemiplegia Restrictions Weight Bearing Restrictions: No General: General OT Amount of Missed Time: 50 Minutes  Therapy/Group: Individual Therapy  Valma Cava 11/02/2020, 11:00 AM

## 2020-11-02 NOTE — Progress Notes (Signed)
PROGRESS NOTE   Subjective/Complaints: Pt eating breakfast with supervision of NT. Says that headaches are reasonably controlled  ROS: Patient denies fever, rash, sore throat, blurred vision, nausea, vomiting, diarrhea, cough, shortness of breath or chest pain,   or mood change.   Objective:   CT CHEST W CONTRAST  Result Date: 11/01/2020 CLINICAL DATA:  Skin cancer.  Restaging. EXAM: CT CHEST WITH CONTRAST TECHNIQUE: Multidetector CT imaging of the chest was performed during intravenous contrast administration. CONTRAST:  81mL OMNIPAQUE IOHEXOL 300 MG/ML  SOLN COMPARISON:  PET-CT 09/25/2020.  Chest CT 12 30/21. FINDINGS: Cardiovascular: The heart size is normal. No substantial pericardial effusion. No thoracic aortic aneurysm. Mediastinum/Nodes: Small right paratracheal lymph nodes evident. 8 mm short axis right hilar node visible on image 70/series 3. Immediately adjacent right hilar lymph node measures 8 mm short axis on 72/3. No left hilar lymphadenopathy. The esophagus has normal imaging features. There is no axillary lymphadenopathy. Lungs/Pleura: Anterior right upper lobe pulmonary nodule measures 12 mm compared to 10 mm on previous PET-CT. 7 mm posterior right upper lobe nodule on 69/5 was 3 mm previously (remeasured). 8 mm right lower lobe nodule on 80/5 was 4 mm previously (remeasured). Index nodule anterior left upper lobe on 88/5 measures 5 mm today compared to 3 mm previously (remeasured). Basilar atelectasis.  No pleural effusion. Upper Abdomen: 2.6 cm low-density lesion posterior right hepatic dome was not visible on previous PET-CT and has progressed since abdominal MRI 09/15/2020 when it was 1.3 cm (remeasured). 1.5 cm caudate lesion was not visible on prior imaging. 2.2 cm inferior right liver lesion on 163/3 measured 9 mm on previous MRI. Musculoskeletal: Subacute posterior right tenth rib fracture noted. IMPRESSION: 1. Interval  progression of bilateral pulmonary nodules, concerning for metastatic disease. 2. Small right hilar lymph nodes. Hypermetabolism is seen in the right hilum on previous PET-CT. Close attention on follow-up recommended. 3. New and progressive ill-defined lesions in the liver when comparing to abdominal MRI of 09/15/2020. Imaging features compatible with progression of metastatic involvement. Electronically Signed   By: Misty Stanley M.D.   On: 11/01/2020 14:33   MR Brain W Wo Contrast  Result Date: 11/01/2020 CLINICAL DATA:  Metastatic melanoma EXAM: MRI HEAD WITHOUT AND WITH CONTRAST TECHNIQUE: Multiplanar, multiecho pulse sequences of the brain and surrounding structures were obtained without and with intravenous contrast. CONTRAST:  70mL GADAVIST GADOBUTROL 1 MMOL/ML IV SOLN COMPARISON:  10/24/2020 FINDINGS: Brain: Numerous intrinsically T1 hyperintense lesions are again identified throughout the brain. As before, there are approximately 20 lesions. No substantial change in size over the short interval. Associated edema is similar. As before, there is partial effacement the right lateral ventricle with mild leftward midline shift. No hydrocephalus. No acute infarction. Susceptibility associated with the metastatic lesions. Vascular: Major vessel flow voids at the skull base are preserved. Skull and upper cervical spine: Prior craniotomies. Normal marrow signal is preserved. Sinuses/Orbits: Paranasal sinuses are aerated. Orbits are unremarkable. Other: Sella is unremarkable.  Mastoid air cells are clear. IMPRESSION: Stable appearance multiple intracranial metastases and associated edema and mild mass effect. Electronically Signed   By: Macy Mis M.D.   On: 11/01/2020 15:36   No  results for input(s): WBC, HGB, HCT, PLT in the last 72 hours. No results for input(s): NA, K, CL, CO2, GLUCOSE, BUN, CREATININE, CALCIUM in the last 72 hours.  Intake/Output Summary (Last 24 hours) at 11/02/2020 0824 Last data  filed at 11/02/2020 0809 Gross per 24 hour  Intake 600 ml  Output --  Net 600 ml        Physical Exam: Vital Signs Blood pressure 110/72, pulse 61, temperature 98.3 F (36.8 C), resp. rate 17, height 5\' 11"  (1.803 m), weight 95 kg, SpO2 99 %. Constitutional: No distress . Vital signs reviewed. HEENT: EOMI, oral membranes moist Neck: supple Cardiovascular: RRR without murmur. No JVD    Respiratory/Chest: CTA Bilaterally without wheezes or rales. Normal effort    GI/Abdomen: BS +, non-tender, non-distended Ext: no clubbing, cyanosis, or edema Psych: pleasant and cooperative Skin: intact Neuro: alert. Follows basic commands. Distracted. Impaired insight and awareness.   RUE and RLE grossly 5/5. LUE 3+ to 4/5 with apraxia, inattention. LLE 3 to 4/5 also with apraxia, inattention. Seems to sense LT and pain in all 4's.   Musculoskeletal: Full ROM, No pain with AROM or PROM in the neck, trunk, or extremities. Posture appropriate      Assessment/Plan: 1. Functional deficits which require 3+ hours per day of interdisciplinary therapy in a comprehensive inpatient rehab setting.  Physiatrist is providing close team supervision and 24 hour management of active medical problems listed below.  Physiatrist and rehab team continue to assess barriers to discharge/monitor patient progress toward functional and medical goals  Care Tool:  Bathing    Body parts bathed by patient: Chest,Abdomen,Face   Body parts bathed by helper: Right arm,Left arm,Front perineal area,Buttocks,Right upper leg,Left upper leg,Right lower leg,Left lower leg     Bathing assist Assist Level: Total Assistance - Patient < 25%     Upper Body Dressing/Undressing Upper body dressing Upper body dressing/undressing activity did not occur (including orthotics): N/A What is the patient wearing?: Pull over shirt    Upper body assist Assist Level: Maximal Assistance - Patient 25 - 49%    Lower Body  Dressing/Undressing Lower body dressing    Lower body dressing activity did not occur: N/A What is the patient wearing?: Pants     Lower body assist Assist for lower body dressing: Total Assistance - Patient < 25%     Toileting Toileting    Toileting assist Assist for toileting: Contact Guard/Touching assist     Transfers Chair/bed transfer  Transfers assist     Chair/bed transfer assist level: Contact Guard/Touching assist     Locomotion Ambulation   Ambulation assist      Assist level: Total Assistance - Patient < 25% Assistive device: Lite Gait Max distance: 308'   Walk 10 feet activity   Assist     Assist level: Total Assistance - Patient < 25% Assistive device: Lite Gait   Walk 50 feet activity   Assist    Assist level: Contact Guard/Touching assist Assistive device: No Device    Walk 150 feet activity   Assist    Assist level: Total Assistance - Patient < 25% Assistive device: Lite Gait    Walk 10 feet on uneven surface  activity   Assist     Assist level: Minimal Assistance - Patient > 75% Assistive device:  (handrail)   Wheelchair     Assist Will patient use wheelchair at discharge?: No             Wheelchair 50  feet with 2 turns activity    Assist            Wheelchair 150 feet activity     Assist          Blood pressure 110/72, pulse 61, temperature 98.3 F (36.8 C), resp. rate 17, height 5\' 11"  (1.803 m), weight 95 kg, SpO2 99 %.  Medical Problem List and Plan: 1.  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   -Follow-up oncology service Dr. Betsy Coder as well as Dr. Mickeal Skinner ongoing plan of care.  Decadron tapered to 4mg  qam and 2mg  qpm  -chest CT 2/16 demonstrates progression of disease, MRI of brain demonstrates stable appearance of mets. Await oncology plan             -patient may  shower             -ELOS/Goals: 11/10/20, min Assist     --Continue CIR therapies including PT, OT, and SLP   2.  Antithrombotics: -DVT/anticoagulation: SCDs             -antiplatelet therapy: N/A 3. Pain Management: Continue Tylenol and oxycodone as needed for headaches, well controlled on 2/17   -using oxycodone sparingly  -have asked him to let us know if his pain is not controlled 4. Mood: Continue Melatonin 5 mg nightly as needed sleep             -antipsychotic agents: N/A  -team providing ego support as needed  -sleep improved 5. Neuropsych: This patient is not capable of making decisions on his own behalf.  -continue trial of ritalin to improve attention and initiation, increase to 10mg  bid 6. Skin/Wound Care: Routine skin checks. May removing remaining suture- will placed nursing order 7. Fluids/Electrolytes/Nutrition: pt admit that he doesn't have a great appetite  -continues to eat well    -added protein supp for low albumin 8.  Seizure prophylaxis. Conitnue Keppra 500 mg twice daily 9.  . Constipation- moving bowels 10. Leukocytosis: d/t steroids      LOS: 7 days A FACE TO FACE EVALUATION WAS PERFORMED  Meredith Staggers 11/02/2020, 8:24 AM

## 2020-11-02 NOTE — Progress Notes (Signed)
Physical Therapy Session Note  Patient Details  Name: Sean [name] MRN: [id number] Date of Birth: 12-01-1965  Today's Date: 11/02/2020 PT Individual Time: 2993-7169 PT Individual Time Calculation (min): 54 min   Short Term Goals: Week 1:  PT Short Term Goal 1 (Week 1): Pt will perform bed mobility with minA. PT Short Term Goal 2 (Week 1): Pt will perform bed to chair transfer with CGA. PT Short Term Goal 3 (Week 1): Pt will ambulate x150' with CGA. PT Short Term Goal 4 (Week 1): Pt will complete x4 steps with BHRs and CGA.  Skilled Therapeutic Interventions/Progress Updates:     Pt received supine in bed and agrees to therapy. No complaint of pain. Pt requires extended time to complete all tasks during session due to impairments in cognition and initiation. Supine to sit with verbal and tactile cues to initiate as well as sequencing and positioning at EOB. Pt dons shoes at EOB with setup assist and requires modA to don shirt with some difficulty threading L arm through sleeve. Pt performs sit to stand with supervision for safety, then ambulates 150' to gym with CGA. Following seated rest break, pt performs NMR for standing balance with cognitive overlay, tasked with completing pipe construction puzzle in standing. Pt is able to attend to task with mod verbal cues for several minutes prior to sitting down. Following extended seated rest break, pt attempts again but becomes internally distracted and uninterested in continuing activity. Pt ambulates additional bouts of 100' and 100' with seated rest break. PT provides CGA/minA for slight LOBs when turning head while ambulating. Pt left supine in bed with alarm intact and all needs within reach.  Therapy Documentation Precautions:  Precautions Precautions: Fall Precaution Comments: L hemiplegia Restrictions Weight Bearing Restrictions: No   Therapy/Group: Individual Therapy  Breck Coons, PT, DPT 11/02/2020, 12:06 PM

## 2020-11-02 NOTE — Progress Notes (Signed)
Neuro-Oncology Update Note  Reviewed recent imaging and discussed case with Dr. [name].  We recommended decreasing dexamethasone to 4mg  daily if tolerated, to optimize candidacy for immunotherapy resumption.    Burden of edema on MRI was not extensive, enhancing lesions were stable.    Will con't to follow  Ventura Sellers, MD

## 2020-11-02 NOTE — Progress Notes (Addendum)
HEMATOLOGY-ONCOLOGY PROGRESS NOTE  SUBJECTIVE: Continues to have intermittent confusion.  Denies headaches and back pain.  Oncology History  Melanoma of skin ([place])  10/05/2020 Initial Diagnosis   Melanoma of skin ([place])   10/05/2020 Cancer Staging   Staging form: Melanoma of the Skin, AJCC 8th Edition - Pathologic: Stage IV (pTX, pNX, pM1) - Signed by [name], MD on 10/05/2020   10/16/2020 -  Chemotherapy    Patient is on Treatment Plan: MELANOMA NIVOLUMAB + IPILIMUMAB (1/3) Q21D / NIVOLUMAB Q14D       PHYSICAL EXAMINATION:  Vitals:   11/01/20 2000 11/02/20 0525  BP: 132/83 110/72  Pulse: 74 61  Resp: 18 17  Temp: 98.3 F (36.8 C) 98.3 F (36.8 C)  SpO2: 99% 99%   Filed Weights   10/26/20 1518  Weight: 95 kg    Intake/Output from previous day: 02/16 0701 - 02/17 0700 In: 720 [P.O.:720] Out: -   GENERAL: Awake and alert, periods of confusion SKIN: skin color, texture, turgor are normal, no rashes or significant lesions OROPHARYNX: No thrush ABDOMEN:abdomen soft, non-tender and normal bowel sounds NEURO: Arousable, oriented to place and year, follows simple commands, moves all extremities to command   LABORATORY DATA:  I have reviewed the data as listed CMP Latest Ref Rng & Units 10/27/2020 10/24/2020 10/22/2020  Glucose 70 - 99 mg/dL 117(H) 110(H) 167(H)  BUN 6 - 20 mg/dL 19 21(H) 19  Creatinine 0.61 - 1.24 mg/dL 0.75 0.69 0.77  Sodium 135 - 145 mmol/L 137 137 139  Potassium 3.5 - 5.1 mmol/L 4.4 3.4(L) 4.0  Chloride 98 - 111 mmol/L 98 102 105  CO2 22 - 32 mmol/L 28 23 23   Calcium 8.9 - 10.3 mg/dL 9.1 9.2 9.0  Total Protein 6.5 - 8.1 g/dL 6.3(L) - -  Total Bilirubin 0.3 - 1.2 mg/dL 0.9 - -  Alkaline Phos 38 - 126 U/L 87 - -  AST 15 - 41 U/L 34 - -  ALT 0 - 44 U/L 100(H) - -    Lab Results  Component Value Date   WBC 15.3 (H) 10/27/2020   HGB 14.1 10/27/2020   HCT 41.2 10/27/2020   MCV 90.7 10/27/2020   PLT 264 10/27/2020   NEUTROABS 10.4 (H)  10/27/2020    DG Chest 2 View  Result Date: 10/20/2020 CLINICAL DATA:  Altered mental status EXAM: CHEST - 2 VIEW COMPARISON:  PET-CT September 25, 2020 FINDINGS: The heart size and mediastinal contours are within normal limits. Streaky left basilar opacity not significantly changed from prior favor atelectasis. Known right upper lobe pulmonary nodule not visualized on today's exam. No pleural effusion. No pneumothorax. The visualized skeletal structures are unremarkable. IMPRESSION: Streaky left basilar opacity, favor atelectasis though developing infection not excluded. Electronically Signed   By: Dahlia Bailiff MD   On: 10/20/2020 17:55   CT HEAD WO CONTRAST  Result Date: 10/25/2020 CLINICAL DATA:  Altered level of consciousness, metastatic melanoma EXAM: CT HEAD WITHOUT CONTRAST TECHNIQUE: Contiguous axial images were obtained from the base of the skull through the vertex without intravenous contrast. COMPARISON:  10/20/2020, 10/24/2020 FINDINGS: Brain: Unenhanced images of the brain again demonstrate numerous hemorrhagic metastases compatible with known metastatic melanoma. The size and number of lesions is unchanged. Diffuse areas of vasogenic edema are seen throughout the bilateral cerebral hemispheres, with effacement of the lateral ventricles right greater than left. No significant midline shift. No acute infarct. No acute extra-axial fluid collections. Vascular: No hyperdense vessel or unexpected calcification. Skull:  Normal. Negative for fracture or focal lesion. Stable postsurgical changes from prior left frontal and left occipital craniotomy. Sinuses/Orbits: No acute finding. Other: None. IMPRESSION: 1. Stable diffuse intracranial metastases compatible with metastatic melanoma. Stable vasogenic edema and effacement of the lateral ventricles. 2. No significant change since recent MRI and head CT. Electronically Signed   By: Randa Ngo M.D.   On: 10/25/2020 22:56   CT Head Wo Contrast  Result  Date: 10/20/2020 CLINICAL DATA:  Mental status change, unknown cause. EXAM: CT HEAD WITHOUT CONTRAST TECHNIQUE: Contiguous axial images were obtained from the base of the skull through the vertex without intravenous contrast. COMPARISON:  MRI head 09/30/20 FINDINGS: Brain: Interval increase in number and size of numerous supratentorial hemorrhagic metastases. The largest lesion on the left measures up to 5 point 1 cm in the left frontal lobe. The largest lesion on the right measures up to 3.3 cm in the right frontal lobe. Of note, there is a hemorrhagic lesion in the right thalamus. There is exuberant vasogenic edema associated with these lesions. No substantial midline shift. There is significant effacement of the right lateral ventricle. No hydrocephalus. Infratentorially, there is an 8 mm hemorrhagic metastasis within the superior right cerebellum. Vascular: No hyperdense vessel identified. Skull: No acute fracture. Prior left frontal and left parietal craniotomy. Sinuses/Orbits: No acute findings. Other: No mastoid effusions. IMPRESSION: Significant interval increase in number and size of numerous infratentorial and supratentorial hemorrhagic metastases with exuberant edema, as detailed above. Resulting effacement of the right lateral ventricle. An MRI with contrast could provide more complete evaluation if clinically indicated. Findings discussed with Dr. Gilford Raid Via telephone at 6:32 PM. Electronically Signed   By: Margaretha Sheffield MD   On: 10/20/2020 18:35   CT CHEST W CONTRAST  Result Date: 11/01/2020 CLINICAL DATA:  Skin cancer.  Restaging. EXAM: CT CHEST WITH CONTRAST TECHNIQUE: Multidetector CT imaging of the chest was performed during intravenous contrast administration. CONTRAST:  74m OMNIPAQUE IOHEXOL 300 MG/ML  SOLN COMPARISON:  PET-CT 09/25/2020.  Chest CT 12 30/21. FINDINGS: Cardiovascular: The heart size is normal. No substantial pericardial effusion. No thoracic aortic aneurysm.  Mediastinum/Nodes: Small right paratracheal lymph nodes evident. 8 mm short axis right hilar node visible on image 70/series 3. Immediately adjacent right hilar lymph node measures 8 mm short axis on 72/3. No left hilar lymphadenopathy. The esophagus has normal imaging features. There is no axillary lymphadenopathy. Lungs/Pleura: Anterior right upper lobe pulmonary nodule measures 12 mm compared to 10 mm on previous PET-CT. 7 mm posterior right upper lobe nodule on 69/5 was 3 mm previously (remeasured). 8 mm right lower lobe nodule on 80/5 was 4 mm previously (remeasured). Index nodule anterior left upper lobe on 88/5 measures 5 mm today compared to 3 mm previously (remeasured). Basilar atelectasis.  No pleural effusion. Upper Abdomen: 2.6 cm low-density lesion posterior right hepatic dome was not visible on previous PET-CT and has progressed since abdominal MRI 09/15/2020 when it was 1.3 cm (remeasured). 1.5 cm caudate lesion was not visible on prior imaging. 2.2 cm inferior right liver lesion on 163/3 measured 9 mm on previous MRI. Musculoskeletal: Subacute posterior right tenth rib fracture noted. IMPRESSION: 1. Interval progression of bilateral pulmonary nodules, concerning for metastatic disease. 2. Small right hilar lymph nodes. Hypermetabolism is seen in the right hilum on previous PET-CT. Close attention on follow-up recommended. 3. New and progressive ill-defined lesions in the liver when comparing to abdominal MRI of 09/15/2020. Imaging features compatible with progression of metastatic involvement. Electronically  Signed   By: Misty Stanley M.D.   On: 11/01/2020 14:33   MR Brain W Wo Contrast  Result Date: 11/01/2020 CLINICAL DATA:  Metastatic melanoma EXAM: MRI HEAD WITHOUT AND WITH CONTRAST TECHNIQUE: Multiplanar, multiecho pulse sequences of the brain and surrounding structures were obtained without and with intravenous contrast. CONTRAST:  76m GADAVIST GADOBUTROL 1 MMOL/ML IV SOLN COMPARISON:   10/24/2020 FINDINGS: Brain: Numerous intrinsically T1 hyperintense lesions are again identified throughout the brain. As before, there are approximately 20 lesions. No substantial change in size over the short interval. Associated edema is similar. As before, there is partial effacement the right lateral ventricle with mild leftward midline shift. No hydrocephalus. No acute infarction. Susceptibility associated with the metastatic lesions. Vascular: Major vessel flow voids at the skull base are preserved. Skull and upper cervical spine: Prior craniotomies. Normal marrow signal is preserved. Sinuses/Orbits: Paranasal sinuses are aerated. Orbits are unremarkable. Other: Sella is unremarkable.  Mastoid air cells are clear. IMPRESSION: Stable appearance multiple intracranial metastases and associated edema and mild mass effect. Electronically Signed   By: PMacy MisM.D.   On: 11/01/2020 15:36   MR Brain W Wo Contrast  Result Date: 10/24/2020 CLINICAL DATA:  Metastatic melanoma EXAM: MRI HEAD WITHOUT AND WITH CONTRAST TECHNIQUE: Multiplanar, multiecho pulse sequences of the brain and surrounding structures were obtained without and with intravenous contrast. CONTRAST:  954mGADAVIST GADOBUTROL 1 MMOL/ML IV SOLN COMPARISON:  None. FINDINGS: Brain: There are numerous lesions throughout the brain, many of which are new and/or have increased in size. There are approximately 20 lesions. The largest lesions are in the left frontal operculum (3.4 x 3.0 cm, 11:15), right basal ganglia (3.5 x 2.5 cm, 11:13) and right occipital lobe (2.8 x 1.9 cm, 16:26). The lesions demonstrate intrinsic hyperintense T1-weighted signal. Beyond that, there is little demonstrated contrast enhancement. There is new mass effect on the right lateral ventricle without significant midline shift. There is moderate vasogenic edema surrounding lesions in both frontal lobes, the right basal ganglia, both occipital lobes and both parietal lobes.  Vascular: Normal flow voids. Skull and upper cervical spine: Normal marrow signal. Remote left anterior and posterior craniotomies. Sinuses/Orbits: Negative. Other: None. IMPRESSION: 1. Progression of metastatic melanoma with multiple new or increased size lesions. 2. Moderate vasogenic edema surrounding lesions in both frontal lobes, right basal ganglia, both occipital lobes and both parietal lobes. 3. New mass effect on the right lateral ventricle without significant midline shift. Electronically Signed   By: KeUlyses Jarred.D.   On: 10/24/2020 03:10     ASSESSMENT AND PLAN: 1. Multiple brain metastases, unknown primary tumor site  CT brain 09/14/2020-multiple hemorrhagic metastases with surrounding edema in the bilateral cerebral hemispheres  CTs chest, abdomen, pelvis 09/14/2020-1.4 cm right upper lobe subpleural nodule, 0.5 cm right lower lobe nodule, multiple hypodensities in the liver suspicious for metastases, 1.5 cm right upper pole hypodense kidney lesion-indeterminate  MRI abdomen 09/15/2020-multifocal T1 hyperintense liver lesions, no kidney mass  Ultrasound-guided biopsy of a segment 4B liver lesion 09/18/2020-benign liver parenchyma with steatosis, no evidence of malignancy  PET 09/25/2020- focal area of hypermetabolism in the right hilum, multiple areas of the liver, pelvic musculature, and left thigh-no CT correlate with any of these areas on the noncontrast CT, 10 mm anterior right upper lobe nodule with low-level hypermetabolism, additional tiny pulmonary nodules too small to characterize by PET  SRS to 10 brain lesions 09/27/2020  Resection of left occipital and left frontal lesions on 09/29/2020-pathology consistent with  malignant melanoma  Cycle 1 ipilimumab/nivolumab 10/16/2020  CT of the brain without contrast 10/26/2020 showed significant interval increase in the number and size of numerous metastases  Brain MRI 10/24/2020-multiple new and increased brain metastases, moderate  edema surrounding multiple lesions, new mass-effect at the right lateral ventricle  Brain MRI 11/01/2020-stable appearance of his multiple intracranial metastases and associated edema and mild mass-effect.  CT of the chest with contrast 11/01/2020-interval progression of bilateral pulmonary nodules concerning for metastatic disease, small right hilar lymph nodes, new and progressive ill-defined lesions in the liver compatible with progression of metastatic involvement.  2.  Left-sided weakness, new onset seizure secondary to #1, weakness has resolved 3.  Hyperlipidemia 4.  Elevated liver enzymes and bilirubin  Liver enzymes more elevated on repeat labs 09/27/2020, improved 09/28/2020 5.  Headache and nausea/vomiting secondary to #1, resolved 6.  Hospital admission 10/20/2020-encephalopathy  Mr. Lair appears stable.  He continues to have intermittent confusion.  He had a repeat CT of the chest with contrast which showed interval progression of his bilateral pulmonary nodules concerning for metastatic disease, small right hilar lymph nodes, and new and progressive ill-defined lesions in the liver compatible with progression of metastatic involvement.  MRI of the brain with and without contrast showed stable appearance of the multiple intracranial metastases and associated edema and mild mass-effect.  Results of the imaging have been reviewed with the patient as well as his wife and sister.  Imaging results could represent disease progression versus flare of the tumor due to immunotherapy.  Our plan at this time is to resume treatment with nivolumab once discharged from inpatient rehab.  Recommend continuation of dexamethasone 4 mg in the morning and 2 mg in the evening.  Last to get dexamethasone down to less than 2 mg/day prior to restarting immunotherapy.  We will reach out to neuro-oncology and ask for their review of his MRI of the brain.  His wife and sister state that plan for discharge is on  Friday, 11/10/2020.  We will plan for immunotherapy on 2/28.  However, the patient's sister and wife are inquiring about earlier discharge from rehab on 2/24 with plans for immunotherapy on 2/25.  Will defer to the inpatient rehab team regarding definitive plan for discharge.  We can adjust our appointments as needed.  Recommendations: 1.  Continue dexamethasone 4 mg in the morning and 2 mg in the evening.  We will consider additional titration tomorrow after discussion with neuro-oncology. 2.  We will plan for outpatient nivolumab once discharged from CIR. 3.  Continue therapy under the care of CIR.   LOS: 7 days   Sean [name] 11/02/20 Sean [name] was interviewed and examined.  He appears unchanged.  I discussed the restaging images with him.  I had separate discussions with his sister and wife by telephone.  I discussed the case with Dr. Mickeal Skinner.  The brain MRI reveals stable disease.  There is evidence of enlargement of lung nodules and liver lesions.  The chest CT findings most likely indicate disease progression, but could indicate a "tumor flare "following immunotherapy.  There was also a short gap between the baseline imaging and initiation of immunotherapy.  The plan is to continue weaning Decadron as tolerated.  Dr. Mickeal Skinner will decrease the Decadron dose today.  I recommend completing another treatment with nivolumab when the Decadron dose is down to less than 4 mg daily.  His wife and sister understand treatment options are limited beyond immunotherapy since the tumor does not harbor  a BRAF mutation.  I will recommend hospice care if it is clear the metastatic disease is progressing in the face of immunotherapy.  We discussed the plan for discharge to home with family supervision.  I was present for greater than 50% of today's visit.  I performed medical decision making.

## 2020-11-02 NOTE — Telephone Encounter (Signed)
Mrs. Elmore called requesting to discuss recent MRI/CT results with Dr. [name]. MD notified

## 2020-11-02 NOTE — Progress Notes (Signed)
Patient ID: Sean [name], male   DOB: August 23, 1966, 55 y.o.   MRN: [id number]  SW received phone call from pt sister [name] reporting family education this weekend 2/19 and 2/10 10am-12pm/1pm-3pm with her and their brother [name]. SW informed will continue to provide updates.   Loralee Pacas, MSW, [place] Office: [phone] Cell: [phone] Fax: [phone]

## 2020-11-02 NOTE — Progress Notes (Signed)
Speech Language Pathology Daily Session Note  Patient Details  Name: Sean [name] MRN: [id number] Date of Birth: 01-Sep-1966  Today's Date: 11/02/2020 SLP Individual Time: 3235-5732 SLP Individual Time Calculation (min): 55 min  Short Term Goals: Week 1: SLP Short Term Goal 1 (Week 1): Patient will initaite functional tasks with Mod A multimodal cues in 50% of opportunities SLP Short Term Goal 2 (Week 1): Patient will verbally respond to questions in 50% of opportunities with Mod A multimodal cues. SLP Short Term Goal 3 (Week 1): Patient will demonstrate orientation to place, time and situation with Mod A multimodal cues. SLP Short Term Goal 4 (Week 1): Patient will demonstrate sustained attention to functional tasks for 5 minutes with Mod A multimodal cues for redirection. SLP Short Term Goal 5 (Week 1): Patient will demonstrate functional problem solving for basic and familiar tasks with Mod A multimodal cues.  Skilled Therapeutic Interventions: Skilled treatment session focused on cognitive goals. Upon arrival, patient was awake in bed. Patient declined getting OOB and reported he was going to head to the apple store later in the day. SLP re-oriented that patient to place and educated him on inability to leave prior to d/c date. Patient verbalized understanding but will need reinforcement. SLP also facilitated session by providing more than a reasonable amount of time (35 minutes) and Mod-Max A verbal cues for initiation and problem solving with a basic PEG pattern in which he had to alternate colors. Throughout task, patient initiated verbal expression but some questions appeared to  Demonstrate language of confusion such as, "what bees are in [place]?" Patient's sister present at end of session and voiced frustration with how much time it is taking to receieved assistance after using the call bell. Offered talking to Licensed conveyancer but she declined. During conversation, patient randomly hit his call  bell. When asked if he needed something, he reported, "nah, I hit it to piss you off." Educated on the importance of using the call bell only when necessary. Patient left supine in bed with alarm on and all needs within reach. Continue with current plan of care.      Pain No/Denies Pain   Therapy/Group: Individual Therapy  [name], [place] 11/02/2020, 3:14 PM

## 2020-11-03 MED ORDER — SENNOSIDES-DOCUSATE SODIUM 8.6-50 MG PO TABS
2.0000 | ORAL_TABLET | Freq: Every day | ORAL | Status: DC
  Administered 2020-11-03 – 2020-11-08 (×5): 2 via ORAL
  Filled 2020-11-03 (×6): qty 2

## 2020-11-03 NOTE — Progress Notes (Signed)
Occupational Therapy Session Note  Patient Details  Name: Sean [name] MRN: [id number] Date of Birth: 06/04/1966  Today's Date: 11/03/2020 OT Individual Time: 1000-1100 OT Individual Time Calculation (min): 60 min    Short Term Goals: Week 1:  OT Short Term Goal 1 (Week 1): Pt will complete sit<>stand out of shower with Mod cues OT Short Term Goal 2 (Week 1): Pt will complete 1 step of UB dressing task with mod questioinong cues OT Short Term Goal 3 (Week 1): Pt will complete 1 step of tolieting task  Skilled Therapeutic Interventions/Progress Updates:    Pt received in wc at nsg station and agreeable to returning to room for self care.  Once in room, placed regular chair at sink and covered it with a towel. Locked pt's brakes and cued him to walk over to sink to sit in chair which he did with only CGA.  Once in wc for UB self care of oral care, wash face, wash UB and don shirt pt was able to do tasks with MAX cues, time to initiate and occasional guiding A.  This took approximately 20 minutes.    Showed pt his new underwear and pants to put on and then he just sat there not responding to any cues but instead asking me personal questions, such as " do you have kids?"  "where does your husband work?" but completely unable to focus on the simple task of standing up.  Spent over 35 minutes trying various techniques and cues to get pt just to stand to remove his pants. Tried 123 stand! Or breaking down cue as simple as possible. Pt would just stare ahead and say, " I hear you, you want me to stand up" but then just would not.  Session time ending so brought w/c next to chair and had to give him very firm cues to move to wc.  Pt finally moved over. Set up in wc with pillow behind back.   (pt leans back against wc and the top ridge of back rest had left a red mark on his back). Belt alarm on and pt returned to nsg station.  Pt does have severe attention and initiation deficits but question how  much of his responses seem behavioral in nature.   Therapy Documentation Precautions:  Precautions Precautions: Fall Precaution Comments: L hemiplegia Restrictions Weight Bearing Restrictions: No   Pain: Pain Assessment Pain Score: 0-No pain ADL: ADL Eating: Maximal assistance,Maximal cueing Grooming: Maximal assistance,Maximal cueing Upper Body Bathing: Maximal assistance,Maximal cueing Lower Body Bathing: Maximal assistance,Maximal cueing Upper Body Dressing: Maximal assistance,Maximal cueing Lower Body Dressing: Maximal assistance,Maximal cueing Toilet Transfer: Minimal assistance Walk-In Shower Transfer: Minimal assistance   Therapy/Group: Individual Therapy  [place] 11/03/2020, 1:05 PM

## 2020-11-03 NOTE — Progress Notes (Addendum)
HEMATOLOGY-ONCOLOGY PROGRESS NOTE  SUBJECTIVE: Continues to have intermittent confusion.    Oncology History  Melanoma of skin ([place])  10/05/2020 Initial Diagnosis   Melanoma of skin ([place])   10/05/2020 Cancer Staging   Staging form: Melanoma of the Skin, AJCC 8th Edition - Pathologic: Stage IV (pTX, pNX, pM1) - Signed by [name], MD on 10/05/2020   10/16/2020 -  Chemotherapy    Patient is on Treatment Plan: MELANOMA NIVOLUMAB + IPILIMUMAB (1/3) Q21D / NIVOLUMAB Q14D       PHYSICAL EXAMINATION:  Vitals:   11/02/20 2016 11/03/20 0502  BP: 102/70 116/79  Pulse: 75 68  Resp: 18 16  Temp: 98.4 F (36.9 C) 98.7 F (37.1 C)  SpO2: 96% 98%   Filed Weights   10/26/20 1518  Weight: 95 kg    Intake/Output from previous day: 02/17 0701 - 02/18 0700 In: 63 [P.O.:480] Out: -   GENERAL: Awake and alert, periods of confusion SKIN: skin color, texture, turgor are normal, no rashes or significant lesions OROPHARYNX: No thrush ABDOMEN:abdomen soft, non-tender and normal bowel sounds NEURO: Arousable, oriented to place and year, follows simple commands, moves all extremities to command   LABORATORY DATA:  I have reviewed the data as listed CMP Latest Ref Rng & Units 10/27/2020 10/24/2020 10/22/2020  Glucose 70 - 99 mg/dL 117(H) 110(H) 167(H)  BUN 6 - 20 mg/dL 19 21(H) 19  Creatinine 0.61 - 1.24 mg/dL 0.75 0.69 0.77  Sodium 135 - 145 mmol/L 137 137 139  Potassium 3.5 - 5.1 mmol/L 4.4 3.4(L) 4.0  Chloride 98 - 111 mmol/L 98 102 105  CO2 22 - 32 mmol/L 28 23 23   Calcium 8.9 - 10.3 mg/dL 9.1 9.2 9.0  Total Protein 6.5 - 8.1 g/dL 6.3(L) - -  Total Bilirubin 0.3 - 1.2 mg/dL 0.9 - -  Alkaline Phos 38 - 126 U/L 87 - -  AST 15 - 41 U/L 34 - -  ALT 0 - 44 U/L 100(H) - -    Lab Results  Component Value Date   WBC 15.3 (H) 10/27/2020   HGB 14.1 10/27/2020   HCT 41.2 10/27/2020   MCV 90.7 10/27/2020   PLT 264 10/27/2020   NEUTROABS 10.4 (H) 10/27/2020    DG Chest 2  View  Result Date: 10/20/2020 CLINICAL DATA:  Altered mental status EXAM: CHEST - 2 VIEW COMPARISON:  PET-CT September 25, 2020 FINDINGS: The heart size and mediastinal contours are within normal limits. Streaky left basilar opacity not significantly changed from prior favor atelectasis. Known right upper lobe pulmonary nodule not visualized on today's exam. No pleural effusion. No pneumothorax. The visualized skeletal structures are unremarkable. IMPRESSION: Streaky left basilar opacity, favor atelectasis though developing infection not excluded. Electronically Signed   By: Dahlia Bailiff MD   On: 10/20/2020 17:55   CT HEAD WO CONTRAST  Result Date: 10/25/2020 CLINICAL DATA:  Altered level of consciousness, metastatic melanoma EXAM: CT HEAD WITHOUT CONTRAST TECHNIQUE: Contiguous axial images were obtained from the base of the skull through the vertex without intravenous contrast. COMPARISON:  10/20/2020, 10/24/2020 FINDINGS: Brain: Unenhanced images of the brain again demonstrate numerous hemorrhagic metastases compatible with known metastatic melanoma. The size and number of lesions is unchanged. Diffuse areas of vasogenic edema are seen throughout the bilateral cerebral hemispheres, with effacement of the lateral ventricles right greater than left. No significant midline shift. No acute infarct. No acute extra-axial fluid collections. Vascular: No hyperdense vessel or unexpected calcification. Skull: Normal. Negative for fracture  or focal lesion. Stable postsurgical changes from prior left frontal and left occipital craniotomy. Sinuses/Orbits: No acute finding. Other: None. IMPRESSION: 1. Stable diffuse intracranial metastases compatible with metastatic melanoma. Stable vasogenic edema and effacement of the lateral ventricles. 2. No significant change since recent MRI and head CT. Electronically Signed   By: Randa Ngo M.D.   On: 10/25/2020 22:56   CT Head Wo Contrast  Result Date: 10/20/2020 CLINICAL  DATA:  Mental status change, unknown cause. EXAM: CT HEAD WITHOUT CONTRAST TECHNIQUE: Contiguous axial images were obtained from the base of the skull through the vertex without intravenous contrast. COMPARISON:  MRI head 09/30/20 FINDINGS: Brain: Interval increase in number and size of numerous supratentorial hemorrhagic metastases. The largest lesion on the left measures up to 5 point 1 cm in the left frontal lobe. The largest lesion on the right measures up to 3.3 cm in the right frontal lobe. Of note, there is a hemorrhagic lesion in the right thalamus. There is exuberant vasogenic edema associated with these lesions. No substantial midline shift. There is significant effacement of the right lateral ventricle. No hydrocephalus. Infratentorially, there is an 8 mm hemorrhagic metastasis within the superior right cerebellum. Vascular: No hyperdense vessel identified. Skull: No acute fracture. Prior left frontal and left parietal craniotomy. Sinuses/Orbits: No acute findings. Other: No mastoid effusions. IMPRESSION: Significant interval increase in number and size of numerous infratentorial and supratentorial hemorrhagic metastases with exuberant edema, as detailed above. Resulting effacement of the right lateral ventricle. An MRI with contrast could provide more complete evaluation if clinically indicated. Findings discussed with Dr. Gilford Raid Via telephone at 6:32 PM. Electronically Signed   By: Margaretha Sheffield MD   On: 10/20/2020 18:35   CT CHEST W CONTRAST  Result Date: 11/01/2020 CLINICAL DATA:  Skin cancer.  Restaging. EXAM: CT CHEST WITH CONTRAST TECHNIQUE: Multidetector CT imaging of the chest was performed during intravenous contrast administration. CONTRAST:  72mL OMNIPAQUE IOHEXOL 300 MG/ML  SOLN COMPARISON:  PET-CT 09/25/2020.  Chest CT 12 30/21. FINDINGS: Cardiovascular: The heart size is normal. No substantial pericardial effusion. No thoracic aortic aneurysm. Mediastinum/Nodes: Small right  paratracheal lymph nodes evident. 8 mm short axis right hilar node visible on image 70/series 3. Immediately adjacent right hilar lymph node measures 8 mm short axis on 72/3. No left hilar lymphadenopathy. The esophagus has normal imaging features. There is no axillary lymphadenopathy. Lungs/Pleura: Anterior right upper lobe pulmonary nodule measures 12 mm compared to 10 mm on previous PET-CT. 7 mm posterior right upper lobe nodule on 69/5 was 3 mm previously (remeasured). 8 mm right lower lobe nodule on 80/5 was 4 mm previously (remeasured). Index nodule anterior left upper lobe on 88/5 measures 5 mm today compared to 3 mm previously (remeasured). Basilar atelectasis.  No pleural effusion. Upper Abdomen: 2.6 cm low-density lesion posterior right hepatic dome was not visible on previous PET-CT and has progressed since abdominal MRI 09/15/2020 when it was 1.3 cm (remeasured). 1.5 cm caudate lesion was not visible on prior imaging. 2.2 cm inferior right liver lesion on 163/3 measured 9 mm on previous MRI. Musculoskeletal: Subacute posterior right tenth rib fracture noted. IMPRESSION: 1. Interval progression of bilateral pulmonary nodules, concerning for metastatic disease. 2. Small right hilar lymph nodes. Hypermetabolism is seen in the right hilum on previous PET-CT. Close attention on follow-up recommended. 3. New and progressive ill-defined lesions in the liver when comparing to abdominal MRI of 09/15/2020. Imaging features compatible with progression of metastatic involvement. Electronically Signed   By:  Misty Stanley M.D.   On: 11/01/2020 14:33   MR Brain W Wo Contrast  Result Date: 11/01/2020 CLINICAL DATA:  Metastatic melanoma EXAM: MRI HEAD WITHOUT AND WITH CONTRAST TECHNIQUE: Multiplanar, multiecho pulse sequences of the brain and surrounding structures were obtained without and with intravenous contrast. CONTRAST:  40mL GADAVIST GADOBUTROL 1 MMOL/ML IV SOLN COMPARISON:  10/24/2020 FINDINGS: Brain:  Numerous intrinsically T1 hyperintense lesions are again identified throughout the brain. As before, there are approximately 20 lesions. No substantial change in size over the short interval. Associated edema is similar. As before, there is partial effacement the right lateral ventricle with mild leftward midline shift. No hydrocephalus. No acute infarction. Susceptibility associated with the metastatic lesions. Vascular: Major vessel flow voids at the skull base are preserved. Skull and upper cervical spine: Prior craniotomies. Normal marrow signal is preserved. Sinuses/Orbits: Paranasal sinuses are aerated. Orbits are unremarkable. Other: Sella is unremarkable.  Mastoid air cells are clear. IMPRESSION: Stable appearance multiple intracranial metastases and associated edema and mild mass effect. Electronically Signed   By: Macy Mis M.D.   On: 11/01/2020 15:36   MR Brain W Wo Contrast  Result Date: 10/24/2020 CLINICAL DATA:  Metastatic melanoma EXAM: MRI HEAD WITHOUT AND WITH CONTRAST TECHNIQUE: Multiplanar, multiecho pulse sequences of the brain and surrounding structures were obtained without and with intravenous contrast. CONTRAST:  8mL GADAVIST GADOBUTROL 1 MMOL/ML IV SOLN COMPARISON:  None. FINDINGS: Brain: There are numerous lesions throughout the brain, many of which are new and/or have increased in size. There are approximately 20 lesions. The largest lesions are in the left frontal operculum (3.4 x 3.0 cm, 11:15), right basal ganglia (3.5 x 2.5 cm, 11:13) and right occipital lobe (2.8 x 1.9 cm, 16:26). The lesions demonstrate intrinsic hyperintense T1-weighted signal. Beyond that, there is little demonstrated contrast enhancement. There is new mass effect on the right lateral ventricle without significant midline shift. There is moderate vasogenic edema surrounding lesions in both frontal lobes, the right basal ganglia, both occipital lobes and both parietal lobes. Vascular: Normal flow voids.  Skull and upper cervical spine: Normal marrow signal. Remote left anterior and posterior craniotomies. Sinuses/Orbits: Negative. Other: None. IMPRESSION: 1. Progression of metastatic melanoma with multiple new or increased size lesions. 2. Moderate vasogenic edema surrounding lesions in both frontal lobes, right basal ganglia, both occipital lobes and both parietal lobes. 3. New mass effect on the right lateral ventricle without significant midline shift. Electronically Signed   By: Ulyses Jarred M.D.   On: 10/24/2020 03:10     ASSESSMENT AND PLAN: 1. Multiple brain metastases, unknown primary tumor site  CT brain 09/14/2020-multiple hemorrhagic metastases with surrounding edema in the bilateral cerebral hemispheres  CTs chest, abdomen, pelvis 09/14/2020-1.4 cm right upper lobe subpleural nodule, 0.5 cm right lower lobe nodule, multiple hypodensities in the liver suspicious for metastases, 1.5 cm right upper pole hypodense kidney lesion-indeterminate  MRI abdomen 09/15/2020-multifocal T1 hyperintense liver lesions, no kidney mass  Ultrasound-guided biopsy of a segment 4B liver lesion 09/18/2020-benign liver parenchyma with steatosis, no evidence of malignancy  PET 09/25/2020- focal area of hypermetabolism in the right hilum, multiple areas of the liver, pelvic musculature, and left thigh-no CT correlate with any of these areas on the noncontrast CT, 10 mm anterior right upper lobe nodule with low-level hypermetabolism, additional tiny pulmonary nodules too small to characterize by PET  SRS to 10 brain lesions 09/27/2020  Resection of left occipital and left frontal lesions on 09/29/2020-pathology consistent with malignant melanoma  Cycle  1 ipilimumab/nivolumab 10/16/2020  CT of the brain without contrast 10/26/2020 showed significant interval increase in the number and size of numerous metastases  Brain MRI 10/24/2020-multiple new and increased brain metastases, moderate edema surrounding multiple  lesions, new mass-effect at the right lateral ventricle  Brain MRI 11/01/2020-stable appearance of his multiple intracranial metastases and associated edema and mild mass-effect.  CT of the chest with contrast 11/01/2020-interval progression of bilateral pulmonary nodules concerning for metastatic disease, small right hilar lymph nodes, new and progressive ill-defined lesions in the liver compatible with progression of metastatic involvement.  2.  Left-sided weakness, new onset seizure secondary to #1, weakness has resolved 3.  Hyperlipidemia 4.  Elevated liver enzymes and bilirubin  Liver enzymes more elevated on repeat labs 09/27/2020, improved 09/28/2020 5.  Headache and nausea/vomiting secondary to #1, resolved 6.  Hospital admission 10/20/2020-encephalopathy  Mr. Maybury appears stable.  He continues to have intermittent confusion.  He had a repeat CT of the chest with contrast which showed interval progression of his bilateral pulmonary nodules concerning for metastatic disease, small right hilar lymph nodes, and new and progressive ill-defined lesions in the liver compatible with progression of metastatic involvement.  MRI of the brain with and without contrast showed stable appearance of the multiple intracranial metastases and associated edema and mild mass-effect.   Imaging results could represent disease progression versus flare of the tumor due to immunotherapy.  Our plan at this time is to resume treatment with nivolumab once discharged from inpatient rehab.  Dexamethasone decreased to 4 mg daily.   Recommendations: 1.  Dexamethasone 4 mg daily.   2.  We will plan for outpatient nivolumab once discharged from [facility]. 3.  Continue therapy under the care of [facility].   LOS: 8 days   Mikey Bussing 11/03/20 Mr. Cantave was interviewed and examined.  He is again more alert today.  He moves all extremities to command.  He remains confused.  We will decrease the Decadron dose today.  The plan is  to resume immunotherapy as an outpatient during the week of 11/13/2020.  Please call oncology as needed.  I will check on him 11/06/2020.  I was present for greater than 50% of today's visit.  I performed medical decision making.

## 2020-11-03 NOTE — Progress Notes (Signed)
Occupational Therapy Weekly Progress Note  Patient Details  Name: Sean [name] MRN: [id number] Date of Birth: [birth year]  Beginning of progress report period: October 27, 2020 End of progress report period: November 03, 2020  Patient has met 2 of 3 short term goals.  Pt is making progress towards OT goals. Pt;s biggest limitation is his poor attention and poor initiation within functional tasks. Pt is able to complete functional toilet transfers with CGA, but he has to initiate these tasks. Pt has difficulty following multi-step commands and is very difficult to re-direct. Pt needs max multimodal cues for BADL tasks mostly for initiation and internal distractions. LB dressing can be as much as max A as he tends to have the most difficulty maintaining attention and initiating this particular task. Continue current POC.   Patient continues to demonstrate the following deficits: muscle weakness, impaired timing and sequencing, motor apraxia, decreased coordination and decreased motor planning, decreased attention to left, decreased motor planning and ideational apraxia, decreased initiation, decreased attention, decreased awareness, decreased problem solving, decreased safety awareness, decreased memory and delayed processing and decreased standing balance, decreased postural control, hemiplegia and decreased balance strategies and therefore will continue to benefit from skilled OT intervention to enhance overall performance with BADL and Reduce care partner burden.  Patient progressing toward long term goals..  Continue plan of care.  OT Short Term Goals Week 1:  OT Short Term Goal 1 (Week 1): Pt will complete sit<>stand out of shower with Mod cues OT Short Term Goal 1 - Progress (Week 1): Progressing toward goal OT Short Term Goal 2 (Week 1): Pt will complete 1 step of UB dressing task with mod questioinong cues OT Short Term Goal 2 - Progress (Week 1): Met OT Short Term Goal 3 (Week 1): Pt will  complete 1 step of tolieting task OT Short Term Goal 3 - Progress (Week 1): Met Week 2:  OT Short Term Goal 1 (Week 2): LTG=STG 2/2 ELOS   Therapy/Group: Individual Therapy  Valma Cava 11/03/2020, 10:46 PM

## 2020-11-03 NOTE — Progress Notes (Signed)
Physical Therapy Weekly Progress Note  Patient Details  Name: Sean [name] MRN: [id number] Date of Birth: 08/04/1966  Beginning of progress report period: October 27, 2020 End of progress report period: November 03, 2020  Today's Date: 11/03/2020 PT Individual Time: 0804-0859 PT Individual Time Calculation (min): 55 min   Patient has met 3 of 4 short term goals.  Pt is progressing toward mobility goals, improving independence with bed mobility, functional transfers, and ambulation. Pt has not addressed stair goals this week but in general has demonstrated improved mobility. Pt continues to present with impaired initiation and requires increased time to perform all tasks due to deficit. Pt also demonstrates ongoing weakness and coordination deficits with L hemibody.  Patient continues to demonstrate the following deficits muscle weakness, decreased cardiorespiratoy endurance, decreased coordination and decreased standing balance, hemiplegia and decreased balance strategies and therefore will continue to benefit from skilled PT intervention to increase functional independence with mobility.  Patient progressing toward long term goals..  Continue plan of care.  PT Short Term Goals Week 1:  PT Short Term Goal 1 (Week 1): Pt will perform bed mobility with minA. PT Short Term Goal 1 - Progress (Week 1): Met PT Short Term Goal 2 (Week 1): Pt will perform bed to chair transfer with CGA. PT Short Term Goal 2 - Progress (Week 1): Met PT Short Term Goal 3 (Week 1): Pt will ambulate x150' with CGA. PT Short Term Goal 4 (Week 1): Pt will complete x4 steps with BHRs and CGA. PT Short Term Goal 4 - Progress (Week 1): Progressing toward goal Week 2:  PT Short Term Goal 1 (Week 2): STGs=LTGs  Skilled Therapeutic Interventions/Progress Updates:      Pt received supine in bed and agrees to therapy. No report of pain. Supine to sit with ved features and cueing to initiate and positioning at EOB. Pt  performs stand step transfer to WC with close supervision and cues on positioning. WC transport outisde for time management and energy conservation. Pt performs gait training outdoors for ambulation over varying surfaces and grades. Pt ambulates 200' x2 with CGA/minA for occasional increased postural sway. Pt also tasked with path finding and requires minA to stay on task and to direct around obstacles. PT providing verbal cues and tactile cues at trunk and hips for posture and balance. Extended seated rest break between bouts.   Pt performs Nustep activity for strength and endurance training. Pt instructed to perform activity for 10:00 on workload of 6. Pt has difficulty attending to task and performs nustep in bouts of ~30 seconds, requiring consistent cueing to continue. Pt cued to perform full range of movement due to tendency for small amplitude movements. Pt also becomes internally distracted throughout activity. Pt completes ~7:00 with many pauses due to attention deficits.  Pt left seated at RN station with all needs within reach.  Therapy Documentation Precautions:  Precautions Precautions: Fall Precaution Comments: L hemiplegia Restrictions Weight Bearing Restrictions: No   Therapy/Group: Individual Therapy   C , PT, DPT 11/03/2020, 5:10 PM   

## 2020-11-03 NOTE — Progress Notes (Signed)
PROGRESS NOTE   Subjective/Complaints: Pt up early with PT. No complaints today. PT felt he might have initiated a little more.   ROS: Patient denies fever, rash, sore throat, blurred vision, nausea, vomiting, diarrhea, cough, shortness of breath or chest pain, joint or back pain, headache, or mood change.   Objective:   CT CHEST W CONTRAST  Result Date: 11/01/2020 CLINICAL DATA:  Skin cancer.  Restaging. EXAM: CT CHEST WITH CONTRAST TECHNIQUE: Multidetector CT imaging of the chest was performed during intravenous contrast administration. CONTRAST:  1mL OMNIPAQUE IOHEXOL 300 MG/ML  SOLN COMPARISON:  PET-CT 09/25/2020.  Chest CT 12 30/21. FINDINGS: Cardiovascular: The heart size is normal. No substantial pericardial effusion. No thoracic aortic aneurysm. Mediastinum/Nodes: Small right paratracheal lymph nodes evident. 8 mm short axis right hilar node visible on image 70/series 3. Immediately adjacent right hilar lymph node measures 8 mm short axis on 72/3. No left hilar lymphadenopathy. The esophagus has normal imaging features. There is no axillary lymphadenopathy. Lungs/Pleura: Anterior right upper lobe pulmonary nodule measures 12 mm compared to 10 mm on previous PET-CT. 7 mm posterior right upper lobe nodule on 69/5 was 3 mm previously (remeasured). 8 mm right lower lobe nodule on 80/5 was 4 mm previously (remeasured). Index nodule anterior left upper lobe on 88/5 measures 5 mm today compared to 3 mm previously (remeasured). Basilar atelectasis.  No pleural effusion. Upper Abdomen: 2.6 cm low-density lesion posterior right hepatic dome was not visible on previous PET-CT and has progressed since abdominal MRI 09/15/2020 when it was 1.3 cm (remeasured). 1.5 cm caudate lesion was not visible on prior imaging. 2.2 cm inferior right liver lesion on 163/3 measured 9 mm on previous MRI. Musculoskeletal: Subacute posterior right tenth rib fracture  noted. IMPRESSION: 1. Interval progression of bilateral pulmonary nodules, concerning for metastatic disease. 2. Small right hilar lymph nodes. Hypermetabolism is seen in the right hilum on previous PET-CT. Close attention on follow-up recommended. 3. New and progressive ill-defined lesions in the liver when comparing to abdominal MRI of 09/15/2020. Imaging features compatible with progression of metastatic involvement. Electronically Signed   By: Misty Stanley M.D.   On: 11/01/2020 14:33   MR Brain W Wo Contrast  Result Date: 11/01/2020 CLINICAL DATA:  Metastatic melanoma EXAM: MRI HEAD WITHOUT AND WITH CONTRAST TECHNIQUE: Multiplanar, multiecho pulse sequences of the brain and surrounding structures were obtained without and with intravenous contrast. CONTRAST:  37mL GADAVIST GADOBUTROL 1 MMOL/ML IV SOLN COMPARISON:  10/24/2020 FINDINGS: Brain: Numerous intrinsically T1 hyperintense lesions are again identified throughout the brain. As before, there are approximately 20 lesions. No substantial change in size over the short interval. Associated edema is similar. As before, there is partial effacement the right lateral ventricle with mild leftward midline shift. No hydrocephalus. No acute infarction. Susceptibility associated with the metastatic lesions. Vascular: Major vessel flow voids at the skull base are preserved. Skull and upper cervical spine: Prior craniotomies. Normal marrow signal is preserved. Sinuses/Orbits: Paranasal sinuses are aerated. Orbits are unremarkable. Other: Sella is unremarkable.  Mastoid air cells are clear. IMPRESSION: Stable appearance multiple intracranial metastases and associated edema and mild mass effect. Electronically Signed   By: Addison Lank.D.  On: 11/01/2020 15:36   No results for input(s): WBC, HGB, HCT, PLT in the last 72 hours. No results for input(s): NA, K, CL, CO2, GLUCOSE, BUN, CREATININE, CALCIUM in the last 72 hours.  Intake/Output Summary (Last 24 hours)  at 11/03/2020 1116 Last data filed at 11/03/2020 0800 Gross per 24 hour  Intake 700 ml  Output --  Net 700 ml        Physical Exam: Vital Signs Blood pressure 116/79, pulse 68, temperature 98.7 F (37.1 C), resp. rate 16, height 5\' 11"  (1.803 m), weight 95 kg, SpO2 98 %. Constitutional: No distress . Vital signs reviewed. HEENT: EOMI, oral membranes moist Neck: supple Cardiovascular: RRR without murmur. No JVD    Respiratory/Chest: CTA Bilaterally without wheezes or rales. Normal effort    GI/Abdomen: BS +, non-tender, non-distended Ext: no clubbing, cyanosis, or edema Psych: pleasant and cooperative, flat Skin: intact Neuro: alert. Follows basic commands. A little more focused. Impaired insight and awareness.   RUE and RLE grossly 5/5. LUE 3+ to 4/5 with apraxia, inattention. LLE 3+ to 4/5 also with apraxia, inattention. Seems to sense LT and pain in all 4's.   Musculoskeletal: Full ROM, No pain with AROM or PROM in the neck, trunk, or extremities. Posture appropriate      Assessment/Plan: 1. Functional deficits which require 3+ hours per day of interdisciplinary therapy in a comprehensive inpatient rehab setting.  Physiatrist is providing close team supervision and 24 hour management of active medical problems listed below.  Physiatrist and rehab team continue to assess barriers to discharge/monitor patient progress toward functional and medical goals  Care Tool:  Bathing    Body parts bathed by patient: Chest,Abdomen,Face   Body parts bathed by helper: Right arm,Left arm,Front perineal area,Buttocks,Right upper leg,Left upper leg,Right lower leg,Left lower leg     Bathing assist Assist Level: Total Assistance - Patient < 25%     Upper Body Dressing/Undressing Upper body dressing Upper body dressing/undressing activity did not occur (including orthotics): N/A What is the patient wearing?: Pull over shirt    Upper body assist Assist Level: Maximal Assistance -  Patient 25 - 49%    Lower Body Dressing/Undressing Lower body dressing    Lower body dressing activity did not occur: N/A What is the patient wearing?: Pants     Lower body assist Assist for lower body dressing: Total Assistance - Patient < 25%     Toileting Toileting    Toileting assist Assist for toileting: Contact Guard/Touching assist     Transfers Chair/bed transfer  Transfers assist     Chair/bed transfer assist level: Contact Guard/Touching assist     Locomotion Ambulation   Ambulation assist      Assist level: Minimal Assistance - Patient > 75% Assistive device: No Device Max distance: 150'   Walk 10 feet activity   Assist     Assist level: Minimal Assistance - Patient > 75% Assistive device: No Device   Walk 50 feet activity   Assist    Assist level: Minimal Assistance - Patient > 75% Assistive device: No Device    Walk 150 feet activity   Assist    Assist level: Minimal Assistance - Patient > 75% Assistive device: No Device    Walk 10 feet on uneven surface  activity   Assist     Assist level: Minimal Assistance - Patient > 75% Assistive device:  (handrail)   Wheelchair     Assist Will patient use wheelchair at discharge?: No  Wheelchair 50 feet with 2 turns activity    Assist            Wheelchair 150 feet activity     Assist          Blood pressure 116/79, pulse 68, temperature 98.7 F (37.1 C), resp. rate 16, height 5\' 11"  (1.803 m), weight 95 kg, SpO2 98 %.  Medical Problem List and Plan: 1.  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   -Follow-up oncology service Dr. Betsy Coder as well as Dr. Mickeal Skinner ongoing plan of care.  Decadron tapered to 4mg  qam and 2mg  qpm  -chest CT 2/16 demonstrated progression of disease, MRI of brain demonstrated stable appearance of mets. Plan is to decrease decadron to 4mg  daily in  anticipation of immunotherapy after discharge             -patient may  shower             -ELOS/Goals: 11/10/20, min Assist    --Continue CIR therapies including PT, OT, and SLP   2.  Antithrombotics: -DVT/anticoagulation: SCDs             -antiplatelet therapy: N/A 3. Pain Management: Continue Tylenol and oxycodone as needed for headaches, well controlled on 2/17   -using oxycodone sparingly  -have asked him to let us know if his pain is not controlled 4. Mood: Continue Melatonin 5 mg nightly as needed sleep             -antipsychotic agents: N/A  -team providing ego support as needed  -sleep patterns improved 5. Neuropsych: This patient is not capable of making decisions on his own behalf.  -continue trial of ritalin 10mg  bid 6. Skin/Wound Care: Routine skin checks. May removing remaining suture- will placed nursing order 7. Fluids/Electrolytes/Nutrition: pt admit that he doesn't have a great appetite  -continues to eat well    -added protein supp for low albumin 8.  Seizure prophylaxis. Conitnue Keppra 500 mg twice daily 9.  . Constipation- last bm 2/15  -sorbitol today  -add scheduled senna-s 10. Leukocytosis: d/t steroids      LOS: 8 days A FACE TO FACE EVALUATION WAS PERFORMED  Meredith Staggers 11/03/2020, 11:16 AM

## 2020-11-03 NOTE — Progress Notes (Signed)
Speech Language Pathology Weekly Progress and Session Note  Patient Details  Name: Tarrin Lebow MRN: [id number] Date of Birth: 1966-03-31  Beginning of progress report period: October 26, 2020 End of progress report period: November 03, 2020  Today's Date: 11/03/2020 SLP Individual Time: 4174-0814 SLP Individual Time Calculation (min): 53 min  Short Term Goals: Week 1: SLP Short Term Goal 1 (Week 1): Patient will initaite functional tasks with Mod A multimodal cues in 50% of opportunities SLP Short Term Goal 1 - Progress (Week 1): Met SLP Short Term Goal 2 (Week 1): Patient will verbally respond to questions in 50% of opportunities with Mod A multimodal cues. SLP Short Term Goal 2 - Progress (Week 1): Met SLP Short Term Goal 3 (Week 1): Patient will demonstrate orientation to place, time and situation with Mod A multimodal cues. SLP Short Term Goal 3 - Progress (Week 1): Met SLP Short Term Goal 4 (Week 1): Patient will demonstrate sustained attention to functional tasks for 5 minutes with Mod A multimodal cues for redirection. SLP Short Term Goal 4 - Progress (Week 1): Not met SLP Short Term Goal 5 (Week 1): Patient will demonstrate functional problem solving for basic and familiar tasks with Mod A multimodal cues. SLP Short Term Goal 5 - Progress (Week 1): Met    New Short Term Goals: Week 2: SLP Short Term Goal 1 (Week 2): STGs=LTGs due to ELOS  Weekly Progress Updates: Patient has made functional gains and has met 4 of 5 STGs this reporting period. Currently, patient requires overall Mod A multimodal cues to complete functional and familiar tasks safely in regards to initiation, problem solving and orientation. However, patient continues to require overall Max A multimodal cues for sustained attention to tasks. With patient's improved initiation, his overall initiation of verbal expression has also improved. However, language can consist of language of confusion at times. Patient  currently requires more than a reasonable amount of time and intermittent encouragement to participate in therapeutic tasks. Patient and family education ongoing. Patient would benefit from continued skilled SLP intervention to maximize his cognitive functioning and overall functional independence prior to discharge.     Intensity: Minumum of 1-2 x/day, 30 to 90 minutes Frequency: 3 to 5 out of 7 days Duration/Length of Stay: 11/10/20 Treatment/Interventions: Cognitive remediation/compensation;Internal/external aids;Cueing hierarchy;Environmental controls;Therapeutic Activities;Functional tasks;Patient/family education   Daily Session  Skilled Therapeutic Interventions:  Skilled treatment session focused on cognitive goals. Patient offered coffee at beginning of session and required extra time and Min verbal cues for problem solving with task. SLP facilitated session by providing total A for orientation to place as patient thought he was in [place]. SLP attempted to engaged patient in familiar task of using the computer to navigate the Internet to find websites of choice (enjoyes reading online about politics per family). Patient required Max A multimodal cues for initiation and attention to task and overall total A for problem solving. Patient consistently typing on top row of keyboard but was unable to utilize multimodal cues for clincian. Attempted to engage patient in conversation that focused on yes/no questions, however, patient consistently answered with vague, nondescript language that did not answer the question. Patient left upright at RN station with alarm on and all needs within reach.      Pain No/Denies Pain   Therapy/Group: Individual Therapy  Gwenith Tschida 11/03/2020, 6:26 AM

## 2020-11-04 NOTE — Progress Notes (Addendum)
PROGRESS NOTE   Subjective/Complaints: No complaints this morning Family is at bedside May d/c IV- order placed  ROS: Patient denies fever, rash, sore throat, blurred vision, nausea, vomiting, diarrhea, cough, shortness of breath or chest pain, joint or back pain, headache, or mood change.   Objective:   No results found. No results for input(s): WBC, HGB, HCT, PLT in the last 72 hours. No results for input(s): NA, K, CL, CO2, GLUCOSE, BUN, CREATININE, CALCIUM in the last 72 hours.  Intake/Output Summary (Last 24 hours) at 11/04/2020 1714 Last data filed at 11/04/2020 0700 Gross per 24 hour  Intake 280 ml  Output --  Net 280 ml        Physical Exam: Vital Signs Blood pressure 107/71, pulse (!) 105, temperature 99 F (37.2 C), resp. rate 18, height 5\' 11"  (1.803 m), weight 95 kg, SpO2 98 %. Gen: no distress, normal appearing HEENT: oral mucosa pink and moist, NCAT Cardio: Tachycardic Chest: normal effort, normal rate of breathing Abd: soft, non-distended Ext: no edema Psych: pleasant and cooperative, flat Skin: intact Neuro: alert. Follows basic commands. A little more focused. Impaired insight and awareness.   RUE and RLE grossly 5/5. LUE 3+ to 4/5 with apraxia, inattention. LLE 3+ to 4/5 also with apraxia, inattention. Seems to sense LT and pain in all 4's.   Musculoskeletal: Full ROM, No pain with AROM or PROM in the neck, trunk, or extremities. Posture appropriate      Assessment/Plan: 1. Functional deficits which require 3+ hours per day of interdisciplinary therapy in a comprehensive inpatient rehab setting.  Physiatrist is providing close team supervision and 24 hour management of active medical problems listed below.  Physiatrist and rehab team continue to assess barriers to discharge/monitor patient progress toward functional and medical goals  Care Tool:  Bathing    Body parts bathed by patient:  Chest,Abdomen,Face   Body parts bathed by helper: Right arm,Left arm,Front perineal area,Buttocks,Right upper leg,Left upper leg,Right lower leg,Left lower leg     Bathing assist Assist Level: Total Assistance - Patient < 25%     Upper Body Dressing/Undressing Upper body dressing Upper body dressing/undressing activity did not occur (including orthotics): N/A What is the patient wearing?: Pull over shirt    Upper body assist Assist Level: Maximal Assistance - Patient 25 - 49%    Lower Body Dressing/Undressing Lower body dressing    Lower body dressing activity did not occur: N/A What is the patient wearing?: Pants     Lower body assist Assist for lower body dressing: Total Assistance - Patient < 25%     Toileting Toileting    Toileting assist Assist for toileting: Contact Guard/Touching assist     Transfers Chair/bed transfer  Transfers assist     Chair/bed transfer assist level: Supervision/Verbal cueing     Locomotion Ambulation   Ambulation assist      Assist level: Minimal Assistance - Patient > 75% Assistive device: No Device Max distance: 200'   Walk 10 feet activity   Assist     Assist level: Minimal Assistance - Patient > 75% Assistive device: No Device   Walk 50 feet activity   Assist  Assist level: Minimal Assistance - Patient > 75% Assistive device: No Device    Walk 150 feet activity   Assist    Assist level: Minimal Assistance - Patient > 75% Assistive device: No Device    Walk 10 feet on uneven surface  activity   Assist     Assist level: Minimal Assistance - Patient > 75% Assistive device:  (handrail)   Wheelchair     Assist Will patient use wheelchair at discharge?: No             Wheelchair 50 feet with 2 turns activity    Assist            Wheelchair 150 feet activity     Assist          Blood pressure 107/71, pulse (!) 105, temperature 99 F (37.2 C), resp. rate 18, height  5\' 11"  (1.803 m), weight 95 kg, SpO2 98 %.  Medical Problem List and Plan: 1.  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   -Follow-up oncology service Dr. Betsy Coder as well as Dr. Mickeal Skinner ongoing plan of care.  Decadron tapered to 4mg  qam and 2mg  qpm  -chest CT 2/16 demonstrated progression of disease, MRI of brain demonstrated stable appearance of mets. Plan is to decrease decadron to 4mg  daily in anticipation of immunotherapy after discharge             -patient may  shower             -ELOS/Goals: 11/10/20, min Assist    --Continue CIR therapies including PT, OT, and SLP   2.  Antithrombotics: -DVT/anticoagulation: SCDs             -antiplatelet therapy: N/A 3. Pain Management: Continue Tylenol and oxycodone as needed for headaches, well controlled on 2/17   -using oxycodone sparingly  -have asked him to let us know if his pain is not controlled 4. Mood: Continue Melatonin 5 mg nightly as needed sleep             -antipsychotic agents: N/A  -team providing ego support as needed  -sleep patterns improved 5. Neuropsych: This patient is not capable of making decisions on his own behalf.  -continue trial of ritalin 10mg  bid 6. Skin/Wound Care: Routine skin checks. May removing remaining suture- will placed nursing order. May d/c IV- nursing order placed 7. Fluids/Electrolytes/Nutrition: pt admit that he doesn't have a great appetite  -continues to eat well    -added protein supp for low albumin 8.  Seizure prophylaxis. Conitnue Keppra 500 mg twice daily 9.  . Constipation  -scheduled senna-s  -had large type 4 BM 2/19 10. Leukocytosis: d/t steroids      LOS: 9 days A FACE TO FACE EVALUATION WAS PERFORMED  Clide Deutscher Lashala Laser 11/04/2020, 5:14 PM

## 2020-11-04 NOTE — Progress Notes (Signed)
Physical Therapy Session Note  Patient Details  Name: Sean [name] MRN: [id number] Date of Birth: 08-17-1966  Today's Date: 11/04/2020 PT Individual Time: 1005-1045 PT Individual Time Calculation (min): 40 min   Short Term Goals: Week 2:  PT Short Term Goal 1 (Week 2): STGs=LTGs  Skilled Therapeutic Interventions/Progress Updates:     Pt received supine in bed and agreeable to PT. Supine>sit transfer with supervision assist and cues for initiation.   Stand pivot transfer to [facility] with supervision assist. Brother and sister present throughout entire session for education. Gait training with supervision assist-CGA in rehab gym x 58f with PT and then assist from family. Cues for gaurding technique behind and to the L due to intermittent LOB to the L from inattention and hemiplegia.   Stair management with supervision assist ascent and min assist with descent requiring facilitation to increase step length on the L. Sister worried, it pt were to attempt the stairs, they would not be able to keep him safe. PT informed that pt has a ramp on the front. Ambulatory transfer up/down ramp with use of RUE support on rails with supervision assist from PT and then with assist from family.   Car transfer training with supervision assist and min-mod cues for safety to access jeep height and sedan height car with assist provided from PT and then sister. Cues for sit>pivot technique to limit time in single limb stance. For extremely receptive of all education.   [facility] and all needs met.      Therapy Documentation Precautions:  Precautions Precautions: Fall Precaution Comments: L hemiplegia Restrictions Weight Bearing Restrictions: No    Vital Signs: Therapy Vitals Temp: 99 F (37.2 C) Pulse Rate: (!) 105 Resp: 18 BP: 107/71 Patient Position (if appropriate):  Sitting Oxygen Therapy SpO2: 98 % O2 Device: Room Air Pain: denies   Therapy/Group: Individual Therapy  ALorie Phenix2/19/2022, 4:31 PM

## 2020-11-04 NOTE — Progress Notes (Signed)
Speech Language Pathology Daily Session Note  Patient Details  Name: Sean [name] MRN: [id number] Date of Birth: Jul 24, 1966  Today's Date: 11/04/2020 SLP Individual Time: 1115-1200 SLP Individual Time Calculation (min): 45 min  Short Term Goals: Week 2: SLP Short Term Goal 1 (Week 2): STGs=LTGs due to ELOS  Skilled Therapeutic Interventions: Skilled treatment session focused on completion of family education with the patient, his brother and his sister. SLP facilitated session by providing education regarding patient's current cognitive functioning and strategies to utilize at home to maximize orientation, initiation, attention and overall safety with tasks. SLP also help generate a list of appropriate tasks/activities he can participate in safely at home. Both family members requested that SLP focus on utilization of voice activation with his cell phone to maximize independence. They reported they would have the wife bring the phone in but wanted to make sure its kept in a safe place to minimize the chance of it getting misplaced. Patient left upright in wheelchair with family present. Continue with current plan of care.      Pain No/Denies Pain   Therapy/Group: Individual Therapy  Berdia Lachman 11/04/2020, 2:02 PM

## 2020-11-04 NOTE — Progress Notes (Signed)
Occupational Therapy Session Note  Patient Details  Name: Sean [name] MRN: [id number] Date of Birth: 08-08-1966  Today's Date: 11/04/2020 Session 1 OT Individual Time: 4098-1191 OT Individual Time Calculation (min): 25 min   Session 2 OT Individual Time: 4782-9562 OT Individual Time Calculation (min): 41 min    Short Term Goals: Week 1:  OT Short Term Goal 1 (Week 1): Pt will complete sit<>stand out of shower with Mod cues OT Short Term Goal 1 - Progress (Week 1): Progressing toward goal OT Short Term Goal 2 (Week 1): Pt will complete 1 step of UB dressing task with mod questioinong cues OT Short Term Goal 2 - Progress (Week 1): Met OT Short Term Goal 3 (Week 1): Pt will complete 1 step of tolieting task OT Short Term Goal 3 - Progress (Week 1): Met  Skilled Therapeutic Interventions/Progress Updates:  Session 1   Pt greeted in bed trying to get up and Telesitter going off. When OT arrived, pt then refused to get up and put his legs back in the bed. OT tried to engage pt in functional toothbrushing task, but pt stated "no, not right now." OT offered pt to go to the bathroom, wash his face, change clothes, but he refused. OT tried to physically help pt bring legs towards EOB, but he became more resistant and said "don't put your hands on me." OT discussed plan for family education this afternoon. Pt engaged in conversation regarding family and things he enjoys. Pt continues to think that he is in [place], but was able to tell me he does live in [place] now.  Tried to get pt to go to the bathroom again after conversation, but he said "not right now." OT tried to orient pt to being in [place], but he argues that he is in [place]. Pt agreeable to lift arms above head a few times for UB strengthening, but would only do 2 in a row before stopping. Pt left semi-reclined in bed with bed alarm on and needs met.  Session 2 Pt greeted seated in wc with sister and brother present for family  education. OT educated on cognition and role this plays within BADL tasks. Educated on starting dressing tasks for him and then providing verbal and tactile cues to complete tasks. Pt brought down to tub room and OT had family members ambulate with pt into bathroom and transfer onto tub bench. Pt seems to respond better to cues from family but still needed MUCH more than reasonable amount of time to swing legs in and out of tub. Pt talking about random things while seated on edge of tub. Pt sat for ~10 minutes before standing to get back to wc. It took his sister and brother on both sides of him and pulling slightly on his arms to get him to stand. OT provided pt's family with home fine motor program for L hand and educated them on activities. Pt left seated in wc with family present and needs met.   Therapy Documentation Precautions:  Precautions Precautions: Fall Precaution Comments: L hemiplegia Restrictions Weight Bearing Restrictions: No Pain:   Denies pain  Therapy/Group: Individual Therapy  Valma Cava 11/04/2020, 1:47 PM

## 2020-11-05 NOTE — Progress Notes (Signed)
Speech Language Pathology Daily Session Note  Patient Details  Name: Nunzio Banet MRN: [id number] Date of Birth: 1966/04/09  Today's Date: 11/05/2020 SLP Individual Time: 1345-1415 SLP Individual Time Calculation (min): 30 min  Short Term Goals: Week 2: SLP Short Term Goal 1 (Week 2): STGs=LTGs due to ELOS  Skilled Therapeutic Interventions: Pt was seen for skilled ST targeting cognition. Upon arrival, pt's wife was assisting pt in changing his clothes (she is signed off on safety plan post in room to provide this assistance) after having diarrhea. SLP provided further assistance primarily with Mod A multimodal cues for initiation and visual cues for following basic 1-step directions to sequence steps of dressing, completing transfer from chair to bed, and during bed mobility. Pt was oriented to month but off on date by about 5 days. He was independently oriented to place. Pt able to demonstrate how to use call bell and verbalized that it is used to "call for assistance" without cues, but Min A verbal cues were required for pt to identify 2 situations he might need to use the call bell for help (ex: to get dressed and to get to the bathroom). Pt began to have more loose bowel movements and as a result missed the last 15 minutes of skilled ST session. Pt left sitting on the toilet with his wife present and assisting; NT also made aware of loose stools and was on her way to provide pt and his wife with further assistance. Continue per current plan of care.      Pain Pain Assessment Pain Scale: Faces Faces Pain Scale: No hurt  Therapy/Group: Individual Therapy  Arbutus Leas 11/05/2020, 7:30 AM

## 2020-11-05 NOTE — Progress Notes (Signed)
PROGRESS NOTE   Subjective/Complaints: No complaints this morning  ROS: Patient denies fever, rash, sore throat, blurred vision, nausea, vomiting, diarrhea, cough, shortness of breath or chest pain, joint or back pain, headache, or mood change.   Objective:   No results found. No results for input(s): WBC, HGB, HCT, PLT in the last 72 hours. No results for input(s): NA, K, CL, CO2, GLUCOSE, BUN, CREATININE, CALCIUM in the last 72 hours.  Intake/Output Summary (Last 24 hours) at 11/05/2020 1038 Last data filed at 11/04/2020 1750 Gross per 24 hour  Intake 120 ml  Output -  Net 120 ml        Physical Exam: Vital Signs Blood pressure (!) 120/94, pulse 75, temperature 98.1 F (36.7 C), resp. rate 19, height 5\' 11"  (1.803 m), weight 95 kg, SpO2 98 %. Gen: no distress, normal appearing HEENT: oral mucosa pink and moist, NCAT Cardio: Reg rate Chest: normal effort, normal rate of breathing Abd: soft, non-distended Ext: no edema Psych: pleasant and cooperative, flat Skin: intact Neuro: alert. Follows basic commands. A little more focused. Impaired insight and awareness.   RUE and RLE grossly 5/5. LUE 3+ to 4/5 with apraxia, inattention. LLE 3+ to 4/5 also with apraxia, inattention. Seems to sense LT and pain in all 4's.   Musculoskeletal: Full ROM, No pain with AROM or PROM in the neck, trunk, or extremities. Posture appropriate      Assessment/Plan: 1. Functional deficits which require 3+ hours per day of interdisciplinary therapy in a comprehensive inpatient rehab setting.  Physiatrist is providing close team supervision and 24 hour management of active medical problems listed below.  Physiatrist and rehab team continue to assess barriers to discharge/monitor patient progress toward functional and medical goals  Care Tool:  Bathing    Body parts bathed by patient: Chest,Abdomen,Face   Body parts bathed by helper:  Right arm,Left arm,Front perineal area,Buttocks,Right upper leg,Left upper leg,Right lower leg,Left lower leg     Bathing assist Assist Level: Total Assistance - Patient < 25%     Upper Body Dressing/Undressing Upper body dressing Upper body dressing/undressing activity did not occur (including orthotics): N/A What is the patient wearing?: Pull over shirt    Upper body assist Assist Level: Maximal Assistance - Patient 25 - 49%    Lower Body Dressing/Undressing Lower body dressing    Lower body dressing activity did not occur: N/A What is the patient wearing?: Pants     Lower body assist Assist for lower body dressing: Total Assistance - Patient < 25%     Toileting Toileting    Toileting assist Assist for toileting: Contact Guard/Touching assist     Transfers Chair/bed transfer  Transfers assist     Chair/bed transfer assist level: Supervision/Verbal cueing     Locomotion Ambulation   Ambulation assist      Assist level: Minimal Assistance - Patient > 75% Assistive device: No Device Max distance: 200'   Walk 10 feet activity   Assist     Assist level: Minimal Assistance - Patient > 75% Assistive device: No Device   Walk 50 feet activity   Assist    Assist level: Minimal Assistance - Patient >  75% Assistive device: No Device    Walk 150 feet activity   Assist    Assist level: Minimal Assistance - Patient > 75% Assistive device: No Device    Walk 10 feet on uneven surface  activity   Assist     Assist level: Minimal Assistance - Patient > 75% Assistive device:  (handrail)   Wheelchair     Assist Will patient use wheelchair at discharge?: No             Wheelchair 50 feet with 2 turns activity    Assist            Wheelchair 150 feet activity     Assist          Blood pressure (!) 120/94, pulse 75, temperature 98.1 F (36.7 C), resp. rate 19, height 5\' 11"  (1.803 m), weight 95 kg, SpO2 98  %.  Medical Problem List and Plan: 1.  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   -Follow-up oncology service Dr. Betsy Coder as well as Dr. Mickeal Skinner ongoing plan of care.  Decadron tapered to 4mg  qam and 2mg  qpm  -chest CT 2/16 demonstrated progression of disease, MRI of brain demonstrated stable appearance of mets. Plan is to decrease decadron to 4mg  daily in anticipation of immunotherapy after discharge             -patient may  shower             -ELOS/Goals: 11/10/20, min Assist    --Continue CIR therapies including PT, OT, and SLP   2.  Antithrombotics: -DVT/anticoagulation: SCDs             -antiplatelet therapy: N/A 3. Pain Management: Continue Tylenol and oxycodone as needed for headaches, well controlled on 2/17   -using oxycodone sparingly  -have asked him to let us know if his pain is not controlled 4. Mood: Continue Melatonin 5 mg nightly as needed sleep             -antipsychotic agents: N/A  -team providing ego support as needed  -sleep patterns improved 5. Neuropsych: This patient is not capable of making decisions on his own behalf.  -continue trial of ritalin 10mg  bid 6. Skin/Wound Care: Routine skin checks. May removing remaining suture- will placed nursing order. May d/c IV- nursing order placed 7. Fluids/Electrolytes/Nutrition: pt admit that he doesn't have a great appetite  -continues to eat well    -added protein supp for low albumin 8.  Seizure prophylaxis. Conitnue Keppra 500 mg twice daily 9.  . Constipation  -scheduled senna-s  -had large type 4 BM 2/19 10. Leukocytosis: d/t steroids 11. Hypertension: diastolic BO elevated: continue to monitor TID      LOS: 10 days A FACE TO FACE EVALUATION WAS PERFORMED  Clide Deutscher Tryton Bodi 11/05/2020, 10:38 AM

## 2020-11-05 NOTE — Progress Notes (Signed)
Physical Therapy Session Note  Patient Details  Name: Sean [name] MRN: [id number] Date of Birth: 01-15-1966  Today's Date: 11/05/2020 PT Individual Time: 1115-1205 PT Individual Time Calculation (min): 50 min   Short Term Goals: Week 2:  PT Short Term Goal 1 (Week 2): STGs=LTGs      Skilled Therapeutic Interventions/Progress Updates:  Pt's sister [name] and wife [name] present for family ed.  Pt seated in wc.   He denied pain.  Family ed with sister for SUV height simulated car transfer, min A for LLE and max cues.  Simulated car transfer with wife for sedan height seat,  CGA and max multimodal cues.  Wife and sister decided that pt will be able to manage sedan better regarding LLE.   PT trained wife in guarding pt for gait on level tile, up/down ramp with R rail, and up/down 12 steps 2 rails.  Pt ambulated 200' on level tile and up/down ramp with wife providing CGA and min cues for attending to task.   Pt up/down 12 steps 2 rails with wife providing CGA and max cues for safety to clear L foot.  Pt self -selected step -through technique when ascending, and step-to leading with LLE when descending.    Wife demonstrated wc parts management and safe transfer of pt in the room, as well as multimodal cues needed to motivate pt when his initiation is poor.  PT added wife's name to safety plan in room for transfers.   At end of session, pt resting in w/c with needs at hand and family present.     Therapy Documentation Precautions:  Precautions Precautions: Fall Precaution Comments: L hemiplegia Restrictions Weight Bearing Restrictions: No        Therapy/Group: Individual Therapy  [name] 11/05/2020, 12:11 PM

## 2020-11-05 NOTE — Progress Notes (Signed)
Occupational Therapy Session Note  Patient Details  Name: Gryffin Altice MRN: [id number] Date of Birth: 1966/02/26  Today's Date: 11/05/2020 OT Individual Time: 1000-1045 OT Individual Time Calculation (min): 45 min    Short Term Goals: Week 1:  OT Short Term Goal 1 (Week 1): Pt will complete sit<>stand out of shower with Mod cues OT Short Term Goal 1 - Progress (Week 1): Progressing toward goal OT Short Term Goal 2 (Week 1): Pt will complete 1 step of UB dressing task with mod questioinong cues OT Short Term Goal 2 - Progress (Week 1): Met OT Short Term Goal 3 (Week 1): Pt will complete 1 step of tolieting task OT Short Term Goal 3 - Progress (Week 1): Met  Skilled Therapeutic Interventions/Progress Updates:    Pt's sister, [name], and wife, [name], present for family ed. Pt resting in bed upon arrival.  Focus on bed mobility, functional amb without AD, toileting, tub tranfsers, and education. Pt's sister and wife provided majority of verbal cues to aid pt in initiating tasks. Pt easily distracted. Educated family on reducing external/envirionmental distractions as much as possible. Amb without AD with CGA. Pt required max verbal cues to change pants after using toilet. Pt stood at sink to wash hands. Pt practiced TTB transfers with sister and wife. Pt required CGA multimodal cues for placing BLE into tub and removing. All tranfsers with CGA. Sister and wife verbalzied understanding of recommendation for 24/7 supervision. Sister and wife provided appropriate verbal and tacile cues to assist pt with initiating tasks. Pt remained in w/c with sister and wife present.   Therapy Documentation Precautions:  Precautions Precautions: Fall Precaution Comments: L hemiplegia Restrictions Weight Bearing Restrictions: No  Pain:  Pt denies pain this morning    Therapy/Group: Individual Therapy  Leroy Libman 11/05/2020, 12:27 PM

## 2020-11-06 ENCOUNTER — Encounter: Payer: Self-pay | Admitting: *Deleted

## 2020-11-06 ENCOUNTER — Inpatient Hospital Stay: Payer: 59 | Admitting: Nurse Practitioner

## 2020-11-06 ENCOUNTER — Inpatient Hospital Stay: Payer: 59

## 2020-11-06 ENCOUNTER — Telehealth: Payer: Self-pay | Admitting: *Deleted

## 2020-11-06 MED ORDER — DEXAMETHASONE 2 MG PO TABS
2.0000 mg | ORAL_TABLET | Freq: Every day | ORAL | Status: DC
  Administered 2020-11-08 – 2020-11-09 (×2): 2 mg via ORAL
  Filled 2020-11-06 (×3): qty 1

## 2020-11-06 NOTE — Progress Notes (Signed)
Occupational Therapy Session Note  Patient Details  Name: Sean [name] MRN: [id number] Date of Birth: 08-28-1966  Today's Date: 11/06/2020 OT Individual Time: 1102-1204 OT Individual Time Calculation (min): 62 min    Short Term Goals: Week 2:  OT Short Term Goal 1 (Week 2): LTG=STG 2/2 ELOS  Skilled Therapeutic Interventions/Progress Updates:    Pt greeted sitting in wc and nurses station and agreeable to OT treatment session. Pt brought down to threrapy gym and worked on B UE strength and endurance with [employer] arm bike. Pt only able to maintain attention to task for 20 second increments and needed hand over hand A to maintain grasp on L hand. Pt often stopping, and trying to pull handles backwards and needed guided A from OT to maintain reciprocal forward motion. BUE ball toss activity with medium sized ball. OT aimed ball towards L hand to halp integrate L hand and encourage L attention/visual scanning. Pt able to follow commands to try to toss ball back with both hands or L hand. Pt did better maintaining attention to this task as the ball coming at him was the cue to participate. Pt never let the ball hit him and at least attempted to catch ball at each trial. Fine motor activity of screwing on nuts and bolts. OT was trying to incorporate familiar task. Pt was able to maintain attention to only 1 nut/bolt at a time before needing max cues to continue. Pt returned to room at end of session and needed extended time to initiate stand-pivot back to bed. Pt then sat EOB and would not initiate return to supine, so OT assisted with pulling legs back in bed. Pt left with bed alarm on and needs met  Therapy Documentation Precautions:  Precautions Precautions: Fall Precaution Comments: L hemiplegia Restrictions Weight Bearing Restrictions: No Pain: Pain Assessment Pain Scale: 0-10 Pain Score: 0-No pain   Therapy/Group: Individual Therapy  Valma Cava 11/06/2020, 11:30 AM

## 2020-11-06 NOTE — Plan of Care (Signed)
  Problem: RH Balance Goal: LTG: Patient will maintain dynamic sitting balance (OT) Description: LTG:  Patient will maintain dynamic sitting balance with assistance during activities of daily living (OT) Flowsheets (Taken 11/06/2020 1249) LTG: Pt will maintain dynamic sitting balance during ADLs with: Supervision/Verbal cueing Note: Goal downgraded 2/21 due to lack of progress-ESD   Problem: RH Grooming Goal: LTG Patient will perform grooming w/assist,cues/equip (OT) Description: LTG: Patient will perform grooming with assist, with/without cues using equipment (OT) Flowsheets (Taken 11/06/2020 1249) LTG: Pt will perform grooming with assistance level of: Minimal Assistance - Patient > 75%   Problem: RH Dressing Goal: LTG Patient will perform upper body dressing (OT) Description: LTG Patient will perform upper body dressing with assist, with/without cues (OT). Flowsheets (Taken 11/06/2020 1249) LTG: Pt will perform upper body dressing with assistance level of: Contact Guard/Touching assist Note: Goal downgraded 2/21 due to lack of progress-ESD   Problem: RH Toilet Transfers Goal: LTG Patient will perform toilet transfers w/assist (OT) Description: LTG: Patient will perform toilet transfers with assist, with/without cues using equipment (OT) Flowsheets (Taken 11/06/2020 1249) LTG: Pt will perform toilet transfers with assistance level of: Contact Guard/Touching assist Note: Goal downgraded 2/21 due to lack of progress-ESD   Problem: RH Tub/Shower Transfers Goal: LTG Patient will perform tub/shower transfers w/assist (OT) Description: LTG: Patient will perform tub/shower transfers with assist, with/without cues using equipment (OT) Flowsheets (Taken 11/06/2020 1249) LTG: Pt will perform tub/shower stall transfers with assistance level of: Contact Guard/Touching assist Note: Goal downgraded 2/21 due to lack of progress-ESD

## 2020-11-06 NOTE — Progress Notes (Signed)
Occupational Therapy Session Note  Patient Details  Name: Sean [name] MRN: [id number] Date of Birth: [birth year]  Today's Date: 11/06/2020 OT Individual Time: 5364-6803 OT Individual Time Calculation (min): 17 min  and Today's Date: 11/06/2020 OT Missed Time: 58 Minutes Missed Time Reason: Patient unwilling/refused to participate without medical reason  Short Term Goals: Week 2:  OT Short Term Goal 1 (Week 2): LTG=STG 2/2 ELOS  Skilled Therapeutic Interventions/Progress Updates:    Pt greeted seated EOB with nurse tech present. Per nurse tech, pt just got back from going to the bathroom. When tech entered, pt was already up. Per tech, pt ambulated into bathroom and stood to void with CGA. OT tried to get pt to perform sit<>stan and pivot to the wc. OT provided MAX multimodal cues, lifted bed, and provided min tactile cues to stand. Pt became agitated with tactile cues. Pt stated "oh you're getting mad now." Despite OT providing calm demeanor and verbal cues. Pt eventually return to supine and would not get back up. Pt stated " I just want to be crappy today." Pt left semi-reclined in bed with bed alarm on, 4 rails up, and telesitter on.   Therapy Documentation Precautions:  Precautions Precautions: Fall Precaution Comments: L hemiplegia Restrictions Weight Bearing Restrictions: No General: General OT Amount of Missed Time: 58 Minutes Pain:  denies pain  Therapy/Group: Individual Therapy  Valma Cava 11/06/2020, 2:44 PM

## 2020-11-06 NOTE — Progress Notes (Signed)
PROGRESS NOTE   Subjective/Complaints:  no new issues. Apparently family ed went ok over w/e. Family wants to take him home Thursday prior immunotherapy apt which is Friday. Headaches only mild  ROS: Patient denies fever, rash, sore throat, blurred vision, nausea, vomiting, diarrhea, cough, shortness of breath or chest pain, joint or back pain,   or mood change.   Objective:   No results found. No results for input(s): WBC, HGB, HCT, PLT in the last 72 hours. No results for input(s): NA, K, CL, CO2, GLUCOSE, BUN, CREATININE, CALCIUM in the last 72 hours.  Intake/Output Summary (Last 24 hours) at 11/06/2020 1114 Last data filed at 11/06/2020 0845 Gross per 24 hour  Intake 417 ml  Output 500 ml  Net -83 ml        Physical Exam: Vital Signs Blood pressure (!) 99/57, pulse 68, temperature 98.1 F (36.7 C), resp. rate 18, height 5\' 11"  (1.803 m), weight 95 kg, SpO2 97 %. Constitutional: No distress . Vital signs reviewed. HEENT: EOMI, oral membranes moist Neck: supple Cardiovascular: RRR without murmur. No JVD    Respiratory/Chest: CTA Bilaterally without wheezes or rales. Normal effort    GI/Abdomen: BS +, non-tender, non-distended Ext: no clubbing, cyanosis, or edema Psych: pleasant and cooperative Skin: intact Neuro: alert. Still with limited insight.   RUE and RLE grossly 5/5. LUE 3+ to 4/5 with apraxia, inattention. LLE 3+ to 4/5 with apraxic. Seems to sense LT and pain in all 4's.   Musculoskeletal: Full ROM, No pain with AROM or PROM in the neck, trunk, or extremities. Posture appropriate      Assessment/Plan: 1. Functional deficits which require 3+ hours per day of interdisciplinary therapy in a comprehensive inpatient rehab setting.  Physiatrist is providing close team supervision and 24 hour management of active medical problems listed below.  Physiatrist and rehab team continue to assess barriers to  discharge/monitor patient progress toward functional and medical goals  Care Tool:  Bathing    Body parts bathed by patient: Chest,Abdomen,Face   Body parts bathed by helper: Right arm,Left arm,Front perineal area,Buttocks,Right upper leg,Left upper leg,Right lower leg,Left lower leg     Bathing assist Assist Level: Total Assistance - Patient < 25%     Upper Body Dressing/Undressing Upper body dressing Upper body dressing/undressing activity did not occur (including orthotics): N/A What is the patient wearing?: Pull over shirt    Upper body assist Assist Level: Maximal Assistance - Patient 25 - 49%    Lower Body Dressing/Undressing Lower body dressing    Lower body dressing activity did not occur: N/A What is the patient wearing?: Pants     Lower body assist Assist for lower body dressing: Total Assistance - Patient < 25%     Toileting Toileting    Toileting assist Assist for toileting: Contact Guard/Touching assist     Transfers Chair/bed transfer  Transfers assist     Chair/bed transfer assist level: Supervision/Verbal cueing     Locomotion Ambulation   Ambulation assist      Assist level: Contact Guard/Touching assist Assistive device: No Device Max distance: 200   Walk 10 feet activity   Assist     Assist  level: Contact Guard/Touching assist Assistive device: No Device   Walk 50 feet activity   Assist    Assist level: Contact Guard/Touching assist Assistive device: No Device    Walk 150 feet activity   Assist    Assist level: Contact Guard/Touching assist Assistive device: No Device    Walk 10 feet on uneven surface  activity   Assist     Assist level: Minimal Assistance - Patient > 75% Assistive device:  (handrail)   Wheelchair     Assist Will patient use wheelchair at discharge?: No             Wheelchair 50 feet with 2 turns activity    Assist            Wheelchair 150 feet activity      Assist          Blood pressure (!) 99/57, pulse 68, temperature 98.1 F (36.7 C), resp. rate 18, height 5\' 11"  (1.803 m), weight 95 kg, SpO2 97 %.  Medical Problem List and Plan: 1.  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   -Follow-up oncology service Dr. Betsy Coder as well as Dr. Mickeal Skinner ongoing plan of care.  Decadron tapered to 4mg  qam and 2mg  qpm  -chest CT 2/16 demonstrated progression of disease, MRI of brain demonstrated stable appearance of mets. Plan is to decrease decadron to 4mg  daily in anticipation of immunotherapy after discharge             -patient may  shower             -ELOS/Goals: will move DC date to 2/24 per family request  --Continue CIR therapies including PT, OT, and SLP   2.  Antithrombotics: -DVT/anticoagulation: SCDs             -antiplatelet therapy: N/A 3. Pain Management: Continue Tylenol and oxycodone as needed for headaches, well controlled on 2/17   -using oxycodone sparingly  -have asked him to let us know if his pain is not controlled 4. Mood: Continue Melatonin 5 mg nightly as needed sleep             -antipsychotic agents: N/A  -team providing ego support as needed  -sleep patterns improved 5. Neuropsych: This patient is not capable of making decisions on his own behalf.  -continue trial of ritalin 10mg  bid--seems to have helped with initiation 6. Skin/Wound Care: Routine skin checks. May removing remaining suture- will placed nursing order. May d/c IV- nursing order placed 7. Fluids/Electrolytes/Nutrition: pt admit that he doesn't have a great appetite  -continues to eat well    -continue protein supp for low albumin 8.  Seizure prophylaxis. Conitnue Keppra 500 mg twice daily 9.  . Constipation  -scheduled senna-s  -had large  BM 2/19 10. Leukocytosis: d/t steroids 11. Hypertension: bp well controlled      LOS: 11 days A FACE TO FACE EVALUATION WAS PERFORMED  Meredith Staggers 11/06/2020, 11:14 AM

## 2020-11-06 NOTE — Telephone Encounter (Signed)
RN received VM from patient's wife with concerns of patient's L foot being cold while the R foot is hot.  RN returned call to wife and informed her to contact patient's healthcare team at [facility].  Patient is currently admitted to CIR at [facility].  Patient's wife confirmed understanding.  Inbasket sent to patient's [facility] oncologist, Dr. [name], to inform him of wife's concern.

## 2020-11-06 NOTE — Progress Notes (Signed)
Patient ID: Sean [name], male   DOB: [birth year], 55 y.o.   MRN: [id number]  SW followed up with pt sister [name] [phone])  to see how well family education this weekend went, and to inform on recommendations for HHPT/OT/SLP and DME: 3in1 BSC and RW. No concerns after family edu. SW informed on TTB being private purchase item; agreeable to get through vendor [employer]. SW to leave HHA list in the room for them to review. [name] would like to know if pt can d/c on Thursday so he can keep his immunotherapy appt on Friday. SW informed will relay to medical team and follow-up with updates after team conference.   SW informed medical team on above. DME ordered with [employer] via parachute. SW left HHA list in room.   SW will continue to follow for d/c needs.   *attending agreeable for pt to d/c on 2/24. SW updated pt sister [place].  Loralee Pacas, MSW, [place] Office: [phone] Cell: [phone] Fax: [phone]

## 2020-11-06 NOTE — Discharge Instructions (Signed)
Inpatient Rehab Discharge Instructions  E[facility] Discharge date and time: No discharge date for patient encounter.   Activities/Precautions/ Functional Status: Activity: activity as tolerated Diet: regular diet Wound Care: Routine skin checks Functional status:  ___ No restrictions     ___ Walk up steps independently ___ 24/7 supervision/assistance   ___ Walk up steps with assistance ___ Intermittent supervision/assistance  ___ Bathe/dress independently ___ Walk with walker     _x__ Bathe/dress with assistance ___ Walk Independently    ___ Shower independently ___ Walk with assistance    ___ Shower with assistance ___ No alcohol     ___ Return to work/school ________   COMMUNITY REFERRALS UPON DISCHARGE:    Home Health:   PT    OT    ST   SNA                Agency:       Phone:    Medical Equipment/Items Ordered: 3in1 bedside commode, and tub transfer bench                                                 Agency/Supplier: [place] [phone]    Special Instructions: No driving smoking or alcohol   My questions have been answered and I understand these instructions. I will adhere to these goals and the provided educational materials after my discharge from the hospital.  Patient/Caregiver Signature _______________________________ Date __________  Clinician Signature _______________________________________ Date __________  Please bring this form and your medication list with you to all your follow-up doctor's appointments.

## 2020-11-06 NOTE — Progress Notes (Addendum)
HEMATOLOGY-ONCOLOGY PROGRESS NOTE  SUBJECTIVE: Continues to have intermittent confusion.  No specific complaints today. No family at the bedside.   Oncology History  Melanoma of skin ([place])  10/05/2020 Initial Diagnosis   Melanoma of skin ([place])   10/05/2020 Cancer Staging   Staging form: Melanoma of the Skin, AJCC 8th Edition - Pathologic: Stage IV (pTX, pNX, pM1) - Signed by [name], MD on 10/05/2020   10/16/2020 -  Chemotherapy    Patient is on Treatment Plan: MELANOMA NIVOLUMAB + IPILIMUMAB (1/3) Q21D / NIVOLUMAB Q14D       PHYSICAL EXAMINATION:  Vitals:   11/06/20 0541 11/06/20 1252  BP: (!) 99/57 113/72  Pulse: 68 68  Resp: 18 17  Temp: 98.1 F (36.7 C) 98.2 F (36.8 C)  SpO2: 97% 100%   Filed Weights   10/26/20 1518  Weight: 95 kg    Intake/Output from previous day: 02/20 0701 - 02/21 0700 In: 417 [P.O.:417] Out: 500 [Urine:500]  GENERAL: Awake and alert, periods of confusion SKIN: skin color, texture, turgor are normal, no rashes or significant lesions OROPHARYNX: No thrush ABDOMEN:abdomen soft, non-tender and normal bowel sounds NEURO: Arousable, oriented to place and year, follows simple commands, moves all extremities to command   LABORATORY DATA:  I have reviewed the data as listed CMP Latest Ref Rng & Units 10/27/2020 10/24/2020 10/22/2020  Glucose 70 - 99 mg/dL 117(H) 110(H) 167(H)  BUN 6 - 20 mg/dL 19 21(H) 19  Creatinine 0.61 - 1.24 mg/dL 0.75 0.69 0.77  Sodium 135 - 145 mmol/L 137 137 139  Potassium 3.5 - 5.1 mmol/L 4.4 3.4(L) 4.0  Chloride 98 - 111 mmol/L 98 102 105  CO2 22 - 32 mmol/L 28 23 23   Calcium 8.9 - 10.3 mg/dL 9.1 9.2 9.0  Total Protein 6.5 - 8.1 g/dL 6.3(L) - -  Total Bilirubin 0.3 - 1.2 mg/dL 0.9 - -  Alkaline Phos 38 - 126 U/L 87 - -  AST 15 - 41 U/L 34 - -  ALT 0 - 44 U/L 100(H) - -    Lab Results  Component Value Date   WBC 15.3 (H) 10/27/2020   HGB 14.1 10/27/2020   HCT 41.2 10/27/2020   MCV 90.7 10/27/2020   PLT  264 10/27/2020   NEUTROABS 10.4 (H) 10/27/2020    DG Chest 2 View  Result Date: 10/20/2020 CLINICAL DATA:  Altered mental status EXAM: CHEST - 2 VIEW COMPARISON:  PET-CT September 25, 2020 FINDINGS: The heart size and mediastinal contours are within normal limits. Streaky left basilar opacity not significantly changed from prior favor atelectasis. Known right upper lobe pulmonary nodule not visualized on today's exam. No pleural effusion. No pneumothorax. The visualized skeletal structures are unremarkable. IMPRESSION: Streaky left basilar opacity, favor atelectasis though developing infection not excluded. Electronically Signed   By: Dahlia Bailiff MD   On: 10/20/2020 17:55   CT HEAD WO CONTRAST  Result Date: 10/25/2020 CLINICAL DATA:  Altered level of consciousness, metastatic melanoma EXAM: CT HEAD WITHOUT CONTRAST TECHNIQUE: Contiguous axial images were obtained from the base of the skull through the vertex without intravenous contrast. COMPARISON:  10/20/2020, 10/24/2020 FINDINGS: Brain: Unenhanced images of the brain again demonstrate numerous hemorrhagic metastases compatible with known metastatic melanoma. The size and number of lesions is unchanged. Diffuse areas of vasogenic edema are seen throughout the bilateral cerebral hemispheres, with effacement of the lateral ventricles right greater than left. No significant midline shift. No acute infarct. No acute extra-axial fluid collections. Vascular: No  hyperdense vessel or unexpected calcification. Skull: Normal. Negative for fracture or focal lesion. [name] postsurgical changes from prior left frontal and left occipital craniotomy. Sinuses/Orbits: No acute finding. Other: None. IMPRESSION: 1. [name] diffuse intracranial metastases compatible with metastatic melanoma. [name] vasogenic edema and effacement of the lateral ventricles. 2. No significant change since recent MRI and head CT. Electronically Signed   By: Randa Ngo M.D.   On: 10/25/2020  22:56   CT Head Wo Contrast  Result Date: 10/20/2020 CLINICAL DATA:  Mental status change, unknown cause. EXAM: CT HEAD WITHOUT CONTRAST TECHNIQUE: Contiguous axial images were obtained from the base of the skull through the vertex without intravenous contrast. COMPARISON:  MRI head 09/30/20 FINDINGS: Brain: Interval increase in number and size of numerous supratentorial hemorrhagic metastases. The largest lesion on the left measures up to 5 point 1 cm in the left frontal lobe. The largest lesion on the right measures up to 3.3 cm in the right frontal lobe. Of note, there is a hemorrhagic lesion in the right thalamus. There is exuberant vasogenic edema associated with these lesions. No substantial midline shift. There is significant effacement of the right lateral ventricle. No hydrocephalus. Infratentorially, there is an 8 mm hemorrhagic metastasis within the superior right cerebellum. Vascular: No hyperdense vessel identified. Skull: No acute fracture. Prior left frontal and left parietal craniotomy. Sinuses/Orbits: No acute findings. Other: No mastoid effusions. IMPRESSION: Significant interval increase in number and size of numerous infratentorial and supratentorial hemorrhagic metastases with exuberant edema, as detailed above. Resulting effacement of the right lateral ventricle. An MRI with contrast could provide more complete evaluation if clinically indicated. Findings discussed with Dr. Gilford Raid Via telephone at 6:32 PM. Electronically Signed   By: Margaretha Sheffield MD   On: 10/20/2020 18:35   CT CHEST W CONTRAST  Result Date: 11/01/2020 CLINICAL DATA:  Skin cancer.  Restaging. EXAM: CT CHEST WITH CONTRAST TECHNIQUE: Multidetector CT imaging of the chest was performed during intravenous contrast administration. CONTRAST:  61mL OMNIPAQUE IOHEXOL 300 MG/ML  SOLN COMPARISON:  PET-CT 09/25/2020.  Chest CT 12 30/21. FINDINGS: Cardiovascular: The heart size is normal. No substantial pericardial effusion. No  thoracic aortic aneurysm. Mediastinum/Nodes: Small right paratracheal lymph nodes evident. 8 mm short axis right hilar node visible on image 70/series 3. Immediately adjacent right hilar lymph node measures 8 mm short axis on 72/3. No left hilar lymphadenopathy. The esophagus has normal imaging features. There is no axillary lymphadenopathy. Lungs/Pleura: Anterior right upper lobe pulmonary nodule measures 12 mm compared to 10 mm on previous PET-CT. 7 mm posterior right upper lobe nodule on 69/5 was 3 mm previously (remeasured). 8 mm right lower lobe nodule on 80/5 was 4 mm previously (remeasured). Index nodule anterior left upper lobe on 88/5 measures 5 mm today compared to 3 mm previously (remeasured). Basilar atelectasis.  No pleural effusion. Upper Abdomen: 2.6 cm low-density lesion posterior right hepatic dome was not visible on previous PET-CT and has progressed since abdominal MRI 09/15/2020 when it was 1.3 cm (remeasured). 1.5 cm caudate lesion was not visible on prior imaging. 2.2 cm inferior right liver lesion on 163/3 measured 9 mm on previous MRI. Musculoskeletal: Subacute posterior right tenth rib fracture noted. IMPRESSION: 1. Interval progression of bilateral pulmonary nodules, concerning for metastatic disease. 2. Small right hilar lymph nodes. Hypermetabolism is seen in the right hilum on previous PET-CT. Close attention on follow-up recommended. 3. New and progressive ill-defined lesions in the liver when comparing to abdominal MRI of 09/15/2020. Imaging features compatible  with progression of metastatic involvement. Electronically Signed   By: Misty Stanley M.D.   On: 11/01/2020 14:33   MR Brain W Wo Contrast  Result Date: 11/01/2020 CLINICAL DATA:  Metastatic melanoma EXAM: MRI HEAD WITHOUT AND WITH CONTRAST TECHNIQUE: Multiplanar, multiecho pulse sequences of the brain and surrounding structures were obtained without and with intravenous contrast. CONTRAST:  6mL GADAVIST GADOBUTROL 1 MMOL/ML  IV SOLN COMPARISON:  10/24/2020 FINDINGS: Brain: Numerous intrinsically T1 hyperintense lesions are again identified throughout the brain. As before, there are approximately 20 lesions. No substantial change in size over the short interval. Associated edema is similar. As before, there is partial effacement the right lateral ventricle with mild leftward midline shift. No hydrocephalus. No acute infarction. Susceptibility associated with the metastatic lesions. Vascular: Major vessel flow voids at the skull base are preserved. Skull and upper cervical spine: Prior craniotomies. Normal marrow signal is preserved. Sinuses/Orbits: Paranasal sinuses are aerated. Orbits are unremarkable. Other: Sella is unremarkable.  Mastoid air cells are clear. IMPRESSION: [name] appearance multiple intracranial metastases and associated edema and mild mass effect. Electronically Signed   By: Macy Mis M.D.   On: 11/01/2020 15:36   MR Brain W Wo Contrast  Result Date: 10/24/2020 CLINICAL DATA:  Metastatic melanoma EXAM: MRI HEAD WITHOUT AND WITH CONTRAST TECHNIQUE: Multiplanar, multiecho pulse sequences of the brain and surrounding structures were obtained without and with intravenous contrast. CONTRAST:  71mL GADAVIST GADOBUTROL 1 MMOL/ML IV SOLN COMPARISON:  None. FINDINGS: Brain: There are numerous lesions throughout the brain, many of which are new and/or have increased in size. There are approximately 20 lesions. The largest lesions are in the left frontal operculum (3.4 x 3.0 cm, 11:15), right basal ganglia (3.5 x 2.5 cm, 11:13) and right occipital lobe (2.8 x 1.9 cm, 16:26). The lesions demonstrate intrinsic hyperintense T1-weighted signal. Beyond that, there is little demonstrated contrast enhancement. There is new mass effect on the right lateral ventricle without significant midline shift. There is moderate vasogenic edema surrounding lesions in both frontal lobes, the right basal ganglia, both occipital lobes and both  parietal lobes. Vascular: Normal flow voids. Skull and upper cervical spine: Normal marrow signal. Remote left anterior and posterior craniotomies. Sinuses/Orbits: Negative. Other: None. IMPRESSION: 1. Progression of metastatic melanoma with multiple new or increased size lesions. 2. Moderate vasogenic edema surrounding lesions in both frontal lobes, right basal ganglia, both occipital lobes and both parietal lobes. 3. New mass effect on the right lateral ventricle without significant midline shift. Electronically Signed   By: Ulyses Jarred M.D.   On: 10/24/2020 03:10     ASSESSMENT AND PLAN: 1. Multiple brain metastases, unknown primary tumor site  CT brain 09/14/2020-multiple hemorrhagic metastases with surrounding edema in the bilateral cerebral hemispheres  CTs chest, abdomen, pelvis 09/14/2020-1.4 cm right upper lobe subpleural nodule, 0.5 cm right lower lobe nodule, multiple hypodensities in the liver suspicious for metastases, 1.5 cm right upper pole hypodense kidney lesion-indeterminate  MRI abdomen 09/15/2020-multifocal T1 hyperintense liver lesions, no kidney mass  Ultrasound-guided biopsy of a segment 4B liver lesion 10/11/2020-benign liver parenchyma with steatosis, no evidence of malignancy  PET 09/25/2020- focal area of hypermetabolism in the right hilum, multiple areas of the liver, pelvic musculature, and left thigh-no CT correlate with any of these areas on the noncontrast CT, 10 mm anterior right upper lobe nodule with low-level hypermetabolism, additional tiny pulmonary nodules too small to characterize by PET  SRS to 10 brain lesions 09/27/2020  Resection of left occipital and left  frontal lesions on 09/29/2020-pathology consistent with malignant melanoma  Cycle 1 ipilimumab/nivolumab 10/16/2020  CT of the brain without contrast 10/26/2020 showed significant interval increase in the number and size of numerous metastases  Brain MRI 10/24/2020-multiple new and increased brain  metastases, moderate edema surrounding multiple lesions, new mass-effect at the right lateral ventricle  Brain MRI 11/01/2020-[name] appearance of his multiple intracranial metastases and associated edema and mild mass-effect.  CT of the chest with contrast 11/01/2020-interval progression of bilateral pulmonary nodules concerning for metastatic disease, small right hilar lymph nodes, new and progressive ill-defined lesions in the liver compatible with progression of metastatic involvement.  2.  Left-sided weakness, new onset seizure secondary to #1, weakness has resolved 3.  Hyperlipidemia 4.  Elevated liver enzymes and bilirubin  Liver enzymes more elevated on repeat labs 09/27/2020, improved 09/28/2020 5.  Headache and nausea/vomiting secondary to #1, resolved 6.  Hospital admission 10/20/2020-encephalopathy  Sean [name].  He continues to have intermittent confusion.  He had a repeat CT of the chest with contrast which showed interval progression of his bilateral pulmonary nodules concerning for metastatic disease, small right hilar lymph nodes, and new and progressive ill-defined lesions in the liver compatible with progression of metastatic involvement.  MRI of the brain with and without contrast showed [name] appearance of the multiple intracranial metastases and associated edema and mild mass-effect.  Imaging results could represent disease progression versus flare of the tumor due to immunotherapy.  Our plan at this time is to resume treatment with nivolumab once discharged from inpatient rehab. Anticipated discharge date is Friday 2/25 and will arrange for follow up in our office either 2/28 or 3/1.   Will decrease dexamethasone to 2 mg daily starting Wednesday 2/23.   Recommendations: 1.  Decrease dexamethasone to 2 mg daily starting 2/23.    2.  We will plan for outpatient nivolumab once discharged from CIR. 3.  Continue therapy under the care of CIR.   LOS: 11 days    Sean [name] 11/06/20 Sean [name] was interviewed and examined.  He remains confused.  His overall status appears unchanged.  There is been no evidence of a clinical decline with tapering of the Decadron.  We will taper the Decadron again on 11/08/2020.  He will be scheduled for outpatient nivolumab within a few days of discharge from the hospital.  I was present for greater than 50% of today's visit.  I performed medical decision making.

## 2020-11-06 NOTE — Progress Notes (Signed)
Noted that wife is trying for return home on 2/24 and wants his next immunotherpathy treatment on Friday this week. Per Dr. [name]: OK to lab/tx 11/10/20. High priority scheduling message sent w/request to call wife with appointment.

## 2020-11-06 NOTE — Progress Notes (Signed)
Physical Therapy Session Note  Patient Details  Name: Sean [name] MRN: [id number] Date of Birth: 1966-03-22  Today's Date: 11/06/2020 PT Individual Time: 0804-0900 PT Individual Time Calculation (min): 56 min   Short Term Goals: Week 2:  PT Short Term Goal 1 (Week 2): STGs=LTGs  Skilled Therapeutic Interventions/Progress Updates:     Pt received seated at EOB with NT present, reporting pt had just gone to bathroom. Pt does not verbalize any pain. PT cues pt to transfer to [facility] and pt demonstrates very little initiation, requiring extended time to complete all mobility tasks. PT trial multiple cues to encourage pt to initiate mobility, but pt remains sitting. Eventually pt performs stand step transfer to [facility] with supervision for safety. WC transport to gym for time management. PT cues pt to step up onto biodex for balance training. Pt again remains sitting and does not stand, despite max verbal and tactile cues from PT. PT asks pt what he would like to do and pt responds he would like to play a card game. PT requests that pt stand to play card game but pt continues to sit in [place]. Eventually pt performs 2x10 LAQs and seated hip flexion with L lower extremity and 1.5lb ankle weight, with PT providing visual demonstration and max encouragement to perform. PT then attempts to have pt stand for gait training but pt unable to initiate movement. Pt left seated at RN station with alarm intact.  Therapy Documentation Precautions:  Precautions Precautions: Fall Precaution Comments: L hemiplegia Restrictions Weight Bearing Restrictions: No   Therapy/Group: Individual Therapy  Breck Coons, PT, DPT 11/06/2020, 4:13 PM

## 2020-11-07 ENCOUNTER — Other Ambulatory Visit: Payer: Self-pay | Admitting: *Deleted

## 2020-11-07 LAB — GLUCOSE, CAPILLARY: Glucose-Capillary: 140 mg/dL — ABNORMAL HIGH (ref 70–99)

## 2020-11-07 MED ORDER — METHYLPHENIDATE HCL 5 MG PO TABS
5.0000 mg | ORAL_TABLET | Freq: Two times a day (BID) | ORAL | Status: AC
  Administered 2020-11-07 – 2020-11-08 (×2): 5 mg via ORAL
  Filled 2020-11-07 (×2): qty 1

## 2020-11-07 MED ORDER — CITALOPRAM HYDROBROMIDE 10 MG PO TABS
10.0000 mg | ORAL_TABLET | Freq: Every day | ORAL | Status: DC
  Administered 2020-11-07 – 2020-11-08 (×2): 10 mg via ORAL
  Filled 2020-11-07 (×2): qty 1

## 2020-11-07 NOTE — Progress Notes (Signed)
Notified wife of husband's treatment appointments on 11/10/20. She is requesting we obtain the office note/results from his ophthalmology visit with Dr. Valetta Close he had just before his immunotherapy started.  Faxed request to office [phone]

## 2020-11-07 NOTE — Progress Notes (Addendum)
Patient ID: Sean [name], male   DOB: Mar 21, 1966, 55 y.o.   MRN: [id number]  SW received phone call from pt sister [name] [phone]) who wanted to provide HHA preference: 1) [place], 2) [facility] and 3) [facility]. She confirms pt wife spoke with [facility] about DME, and items are to be delivered to the home. SW provided updates from team conference. SW to follow-up once HHA is confirmed.  Pt sister mentioned he has jury duty and the information was placed into his chart on floor. SW unaware but will look into it.  SW found notice in pt hard chart. SW faxed over letter to [facility] [phone]  SW sent HHPT/OT/SLP referral to [facility]. SW waiting on follow-up.    Loralee Pacas, MSW, [place] Office: [phone] Cell: [phone] Fax: [phone]

## 2020-11-07 NOTE — Progress Notes (Signed)
Occupational Therapy Note  Patient Details  Name: Sean [name] MRN: [id number] Date of Birth: 12/31/1965  Today's Date: 11/07/2020 OT Missed Time: 54 Minutes Missed Time Reason: Patient unwilling/refused to participate without medical reason  Patient greeted semi-reclined in bed with eyes clothes. OT encouraged pt to participate in therapy. Offered for pt to eat lunch, go to the bathroom, or sit up out of bed. Pt said "ehh, not right now." OT continued to encourage pt, but he continued to refuse. Pt left semi-reclined in bed with bed alarm on, call bell in reach, and needs met.    Daneen Schick Shardea Cwynar 11/07/2020, 2:28 PM

## 2020-11-07 NOTE — Plan of Care (Signed)
  Problem: RH Bathing Goal: LTG Patient will bathe all body parts with assist levels (OT) Description: LTG: Patient will bathe all body parts with assist levels (OT) Outcome: Not Met (add Reason)   Problem: RH Dressing Goal: LTG Patient will perform lower body dressing w/assist (OT) Description: LTG: Patient will perform lower body dressing with assist, with/without cues in positioning using equipment (OT) Outcome: Not Met (add Reason)   Problem: RH Attention Goal: LTG Patient will demonstrate this level of attention during functional activites (OT) Description: LTG:  Patient will demonstrate this level of attention during functional activites  (OT) Outcome: Not Met (add Reason)   Problem: RH Awareness Goal: LTG: Patient will demonstrate awareness during functional activites type of (OT) Description: LTG: Patient will demonstrate awareness during functional activites type of (OT) Outcome: Not Met (add Reason)   Problem: RH Balance Goal: LTG: Patient will maintain dynamic sitting balance (OT) Description: LTG:  Patient will maintain dynamic sitting balance with assistance during activities of daily living (OT) Outcome: Completed/Met Goal: LTG Patient will maintain dynamic standing with ADLs (OT) Description: LTG:  Patient will maintain dynamic standing balance with assist during activities of daily living (OT)  Outcome: Completed/Met   Problem: RH Eating Goal: LTG Patient will perform eating w/assist, cues/equip (OT) Description: LTG: Patient will perform eating with assist, with/without cues using equipment (OT) Outcome: Completed/Met   Problem: RH Grooming Goal: LTG Patient will perform grooming w/assist,cues/equip (OT) Description: LTG: Patient will perform grooming with assist, with/without cues using equipment (OT) Outcome: Completed/Met   Problem: RH Dressing Goal: LTG Patient will perform upper body dressing (OT) Description: LTG Patient will perform upper body dressing  with assist, with/without cues (OT). Outcome: Completed/Met   Problem: RH Toileting Goal: LTG Patient will perform toileting task (3/3 steps) with assistance level (OT) Description: LTG: Patient will perform toileting task (3/3 steps) with assistance level (OT)  Outcome: Completed/Met   Problem: RH Functional Use of Upper Extremity Goal: LTG Patient will use RT/LT upper extremity as a (OT) Description: LTG: Patient will use right/left upper extremity as a stabilizer/gross assist/diminished/nondominant/dominant level with assist, with/without cues during functional activity (OT) Outcome: Completed/Met   Problem: RH Toilet Transfers Goal: LTG Patient will perform toilet transfers w/assist (OT) Description: LTG: Patient will perform toilet transfers with assist, with/without cues using equipment (OT) Outcome: Completed/Met   Problem: RH Tub/Shower Transfers Goal: LTG Patient will perform tub/shower transfers w/assist (OT) Description: LTG: Patient will perform tub/shower transfers with assist, with/without cues using equipment (OT) Outcome: Completed/Met

## 2020-11-07 NOTE — Discharge Summary (Signed)
Physician Discharge Summary  Patient ID: Sean [name] MRN: [id number] DOB/AGE: 57-Mar-1967 58 y.o.  Admit date: 10/26/2020 Discharge date: 11/09/2020  Discharge Diagnoses:  Principal Problem:   Brain tumor [facility]) Active Problems:   Malignant melanoma ([place]) DVT prophylaxis Pain management Seizure prophylaxis Mood stabilization Constipation Hypertension  Discharged Condition: Stable  Significant Diagnostic Studies: DG Chest 2 View  Result Date: 10/20/2020 CLINICAL DATA:  Altered mental status EXAM: CHEST - 2 VIEW COMPARISON:  PET-CT September 25, 2020 FINDINGS: The heart size and mediastinal contours are within normal limits. Streaky left basilar opacity not significantly changed from prior favor atelectasis. Known right upper lobe pulmonary nodule not visualized on today's exam. No pleural effusion. No pneumothorax. The visualized skeletal structures are unremarkable. IMPRESSION: Streaky left basilar opacity, favor atelectasis though developing infection not excluded. Electronically Signed   By: Dahlia Bailiff MD   On: 10/20/2020 17:55   CT HEAD WO CONTRAST  Result Date: 10/25/2020 CLINICAL DATA:  Altered level of consciousness, metastatic melanoma EXAM: CT HEAD WITHOUT CONTRAST TECHNIQUE: Contiguous axial images were obtained from the base of the skull through the vertex without intravenous contrast. COMPARISON:  10/20/2020, 10/24/2020 FINDINGS: Brain: Unenhanced images of the brain again demonstrate numerous hemorrhagic metastases compatible with known metastatic melanoma. The size and number of lesions is unchanged. Diffuse areas of vasogenic edema are seen throughout the bilateral cerebral hemispheres, with effacement of the lateral ventricles right greater than left. No significant midline shift. No acute infarct. No acute extra-axial fluid collections. Vascular: No hyperdense vessel or unexpected calcification. Skull: Normal. Negative for fracture or focal lesion. Stable postsurgical  changes from prior left frontal and left occipital craniotomy. Sinuses/Orbits: No acute finding. Other: None. IMPRESSION: 1. Stable diffuse intracranial metastases compatible with metastatic melanoma. Stable vasogenic edema and effacement of the lateral ventricles. 2. No significant change since recent MRI and head CT. Electronically Signed   By: Randa Ngo M.D.   On: 10/25/2020 22:56   CT Head Wo Contrast  Result Date: 10/20/2020 CLINICAL DATA:  Mental status change, unknown cause. EXAM: CT HEAD WITHOUT CONTRAST TECHNIQUE: Contiguous axial images were obtained from the base of the skull through the vertex without intravenous contrast. COMPARISON:  MRI head 09/30/20 FINDINGS: Brain: Interval increase in number and size of numerous supratentorial hemorrhagic metastases. The largest lesion on the left measures up to 5 point 1 cm in the left frontal lobe. The largest lesion on the right measures up to 3.3 cm in the right frontal lobe. Of note, there is a hemorrhagic lesion in the right thalamus. There is exuberant vasogenic edema associated with these lesions. No substantial midline shift. There is significant effacement of the right lateral ventricle. No hydrocephalus. Infratentorially, there is an 8 mm hemorrhagic metastasis within the superior right cerebellum. Vascular: No hyperdense vessel identified. Skull: No acute fracture. Prior left frontal and left parietal craniotomy. Sinuses/Orbits: No acute findings. Other: No mastoid effusions. IMPRESSION: Significant interval increase in number and size of numerous infratentorial and supratentorial hemorrhagic metastases with exuberant edema, as detailed above. Resulting effacement of the right lateral ventricle. An MRI with contrast could provide more complete evaluation if clinically indicated. Findings discussed with Dr. Gilford Raid Via telephone at 6:32 PM. Electronically Signed   By: Margaretha Sheffield MD   On: 10/20/2020 18:35   CT CHEST W CONTRAST  Result  Date: 11/01/2020 CLINICAL DATA:  Skin cancer.  Restaging. EXAM: CT CHEST WITH CONTRAST TECHNIQUE: Multidetector CT imaging of the chest was performed during intravenous contrast administration. CONTRAST:  48mL OMNIPAQUE IOHEXOL 300 MG/ML  SOLN COMPARISON:  PET-CT 09/25/2020.  Chest CT 12 30/21. FINDINGS: Cardiovascular: The heart size is normal. No substantial pericardial effusion. No thoracic aortic aneurysm. Mediastinum/Nodes: Small right paratracheal lymph nodes evident. 8 mm short axis right hilar node visible on image 70/series 3. Immediately adjacent right hilar lymph node measures 8 mm short axis on 72/3. No left hilar lymphadenopathy. The esophagus has normal imaging features. There is no axillary lymphadenopathy. Lungs/Pleura: Anterior right upper lobe pulmonary nodule measures 12 mm compared to 10 mm on previous PET-CT. 7 mm posterior right upper lobe nodule on 69/5 was 3 mm previously (remeasured). 8 mm right lower lobe nodule on 80/5 was 4 mm previously (remeasured). Index nodule anterior left upper lobe on 88/5 measures 5 mm today compared to 3 mm previously (remeasured). Basilar atelectasis.  No pleural effusion. Upper Abdomen: 2.6 cm low-density lesion posterior right hepatic dome was not visible on previous PET-CT and has progressed since abdominal MRI 09/15/2020 when it was 1.3 cm (remeasured). 1.5 cm caudate lesion was not visible on prior imaging. 2.2 cm inferior right liver lesion on 163/3 measured 9 mm on previous MRI. Musculoskeletal: Subacute posterior right tenth rib fracture noted. IMPRESSION: 1. Interval progression of bilateral pulmonary nodules, concerning for metastatic disease. 2. Small right hilar lymph nodes. Hypermetabolism is seen in the right hilum on previous PET-CT. Close attention on follow-up recommended. 3. New and progressive ill-defined lesions in the liver when comparing to abdominal MRI of 09/15/2020. Imaging features compatible with progression of metastatic involvement.  Electronically Signed   By: Misty Stanley M.D.   On: 11/01/2020 14:33   MR Brain W Wo Contrast  Result Date: 11/01/2020 CLINICAL DATA:  Metastatic melanoma EXAM: MRI HEAD WITHOUT AND WITH CONTRAST TECHNIQUE: Multiplanar, multiecho pulse sequences of the brain and surrounding structures were obtained without and with intravenous contrast. CONTRAST:  11mL GADAVIST GADOBUTROL 1 MMOL/ML IV SOLN COMPARISON:  10/24/2020 FINDINGS: Brain: Numerous intrinsically T1 hyperintense lesions are again identified throughout the brain. As before, there are approximately 20 lesions. No substantial change in size over the short interval. Associated edema is similar. As before, there is partial effacement the right lateral ventricle with mild leftward midline shift. No hydrocephalus. No acute infarction. Susceptibility associated with the metastatic lesions. Vascular: Major vessel flow voids at the skull base are preserved. Skull and upper cervical spine: Prior craniotomies. Normal marrow signal is preserved. Sinuses/Orbits: Paranasal sinuses are aerated. Orbits are unremarkable. Other: Sella is unremarkable.  Mastoid air cells are clear. IMPRESSION: Stable appearance multiple intracranial metastases and associated edema and mild mass effect. Electronically Signed   By: Macy Mis M.D.   On: 11/01/2020 15:36   MR Brain W Wo Contrast  Result Date: 10/24/2020 CLINICAL DATA:  Metastatic melanoma EXAM: MRI HEAD WITHOUT AND WITH CONTRAST TECHNIQUE: Multiplanar, multiecho pulse sequences of the brain and surrounding structures were obtained without and with intravenous contrast. CONTRAST:  66mL GADAVIST GADOBUTROL 1 MMOL/ML IV SOLN COMPARISON:  None. FINDINGS: Brain: There are numerous lesions throughout the brain, many of which are new and/or have increased in size. There are approximately 20 lesions. The largest lesions are in the left frontal operculum (3.4 x 3.0 cm, 11:15), right basal ganglia (3.5 x 2.5 cm, 11:13) and right  occipital lobe (2.8 x 1.9 cm, 16:26). The lesions demonstrate intrinsic hyperintense T1-weighted signal. Beyond that, there is little demonstrated contrast enhancement. There is new mass effect on the right lateral ventricle without significant midline shift. There is moderate  vasogenic edema surrounding lesions in both frontal lobes, the right basal ganglia, both occipital lobes and both parietal lobes. Vascular: Normal flow voids. Skull and upper cervical spine: Normal marrow signal. Remote left anterior and posterior craniotomies. Sinuses/Orbits: Negative. Other: None. IMPRESSION: 1. Progression of metastatic melanoma with multiple new or increased size lesions. 2. Moderate vasogenic edema surrounding lesions in both frontal lobes, right basal ganglia, both occipital lobes and both parietal lobes. 3. New mass effect on the right lateral ventricle without significant midline shift. Electronically Signed   By: Ulyses Jarred M.D.   On: 10/24/2020 03:10    Labs:  Basic Metabolic Panel: No results for input(s): NA, K, CL, CO2, GLUCOSE, BUN, CREATININE, CALCIUM, MG, PHOS in the last 168 hours.  CBC: No results for input(s): WBC, NEUTROABS, HGB, HCT, MCV, PLT in the last 168 hours.  CBG: Recent Labs  Lab 11/07/20 2045  GLUCAP 140*    Family history.  Father with hypertension and colon polyps.  Negative for colon cancer or stomach cancer rectal cancer esophageal cancer  Brief HPI:   Sean [name] is a 55 y.o. 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 Betsy Coder of oncology services and recently placed on dexamethasone with slow taper.  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  Presented 10/20/2020 with right side weakness and aphasia as well as fatigue x4 days.  Admission chemistries unremarkable aside from glucose 104 ALT 48  hemoglobin 15 urine culture multiple species lactic acid 0.9.  CT of the head showed significant interval increase in number and size of numerous infratentorial and supratentorial hemorrhagic metastasis with exuberant edema.  Resulting effacement of the right lateral ventricle.  Recent MRI of abdomen 09/15/2020 multifocal T1 hyperintense liver lesions no kidney mass.  He underwent ultrasound-guided biopsy of segment 4B liver lesion 09/18/2020 benign liver parenchyma with steatosis, no evidence of malignancy.  PET scan 09/25/2020 focal area of hypermetabolism in the right hilum, multiple areas of the liver pelvic musculature and left thigh no CT correlate with any of these areas on the noncontrast CT.  10 mm anterior right upper lobe nodule with low-level hypermetabolism additional tiny pulmonary nodules too small to characterize by PET.  Underwent resection of left occipital left frontal lesion 10/09/2020 pathology consistent with malignant melanoma.  MRI of the brain completed 10/24/2020 showed progression of metastatic melanoma with multiple new or increased size lesions.  Moderate vasogenic edema surrounding lesions in both frontal lobes right basal ganglia both occipital lobes and both parietal lobes.  New mass-effect on the right lateral ventricle without significant midline shift.  Oncology follow-up as well as Dr. Mickeal Skinner remained on high-dose intravenous Decadron and adjusted accordingly.  Keppra initiated for CVA prophylaxis.  Palliative care consulted to establish goals of care.  Therapy evaluations completed due to decline in ADLs and mobility patient was admitted for a comprehensive rehab program.   Hospital Course: Sean [name] was admitted to rehab 10/26/2020 for inpatient therapies to consist of PT, ST and OT at least three hours five days a week. Past admission physiatrist, therapy team and rehab RN have worked together to provide customized collaborative inpatient rehab.  Pertaining to patient's  malignant melanoma multiple metastatic mets in the brain follow-up oncology service Dr. Betsy Coder as well as Dr. Mickeal Skinner ongoing plans of Decadron as advised.  Chest CT 11/01/2020 demonstrated progression of disease.  MRI of  the brain demonstrated stable appearance of mets.  Plan was decreased Decadron 2 mg daily in anticipation of immunotherapy after discharge.  SCDs for DVT prophylaxis.  Oxycodone use sparingly for pain management and tapered to off.  Melatonin for sleep with good results.  Mood stabilization placed on trial of Ritalin to enable patient to establish better focus to tasks and later discontinued and Celexa was established.  Keppra ongoing for seizure prophylaxis no seizure activity.  Bouts of constipation resolved with laxative assistance.   Blood pressures were monitored on TID basis and controlled     Rehab course: During patient's stay in rehab weekly team conferences were held to monitor patient's progress, set goals and discuss barriers to discharge. At admission, patient required minimal assist sit to stand moderate assist side-lying to sitting min mod assist 175 feet rolling walker  Physical exam.  Blood pressure 108/73 pulse 74 temperature 97.8 respirations 17 oxygen saturation 95% room air Constitutional.  Alert no acute distress HEENT.  Left forehead spot from previous craniotomy left occipital scabs Eyes.  Pupils round and reactive to light no discharge.nystagmus Neck.  Supple nontender no JVD without thyromegaly Cardiac regular rate rhythm no extra sounds or murmur heard Abdomen.  Soft nontender positive bowel sounds not rebound Respiratory effort normal no respiratory distress without wheeze Musculoskeletal.  Normal range of motion Neurologic alert no acute distress provides his name and age follows commands some delay in processing and naming decrease insight and awareness.  Delayed and slow process some jumbled words.  He/She  has had improvement in activity  tolerance, balance, postural control as well as ability to compensate for deficits. He/She has had improvement in functional use RUE/LUE  and RLE/LLE as well as improvement in awareness.  Patient attending therapies very little initiation needed ongoing cues.  Performs stand step transfers to wheelchair with supervision for safety.  Ambulated to the bathroom and stood to void with contact-guard.  Again needed multiple cues for safety and awareness.  Patient did have some bouts of agitation during his initiation of therapies.  He was able to increase his ambulation up to 200 feet on level tile contact-guard assist.  He was oriented to month but needed ongoing cues for days of the week.  He was independently oriented to place.  Again it was stressed the family need for his supervision for safety full family teaching was completed and discharged to home       Disposition: Discharge to home    Diet: Regular  Special Instructions: No driving smoking or alcohol  Medications at discharge 1.  Tylenol as needed 2.  Decadron 2 mg p.o. daily 3.  Keppra 500 mg p.o. twice daily 4.  Melatonin 5 mg nightly as needed sleep 5.  Protonix 40 mg daily 6.  Senokot 2 tabs p.o. nightly 7.  Celexa 10 mg nightly  30-35 minutes were spent completing discharge summary and discharge planning     Follow-up Information    Meredith Staggers, MD Follow up.   Specialty: Physical Medicine and Rehabilitation Why: No follow-up needed Contact information: [address] [place] [address] [phone]        [name], MD Follow up.   Specialty: Oncology Why: Call for appointment Contact information: [place] [address] [phone]        Ventura Sellers, MD Follow up.   Specialties: Psychiatry, Neurology, Oncology Why: Call for appointment Contact information: [place] [address]  [phone]                Signed: Lavon Paganini [place] 11/09/2020, 5:17 AM

## 2020-11-07 NOTE — Progress Notes (Signed)
Physical Therapy Session Note  Patient Details  Name: Sean [name] MRN: [id number] Date of Birth: August 19, 1966  Today's Date: 11/07/2020 PT Missed Time: 45 Minutes Missed Time Reason: Patient unwilling to participate  Short Term Goals: Week 2:  PT Short Term Goal 1 (Week 2): STGs=LTGs  Skilled Therapeutic Interventions/Progress Updates:     Pt lying supine in bed. Does not initiate mobility despite increased time provided and multiple cues. When asked, pt says he would prefer to rest. PT will follow up.  Therapy Documentation Precautions:  Precautions Precautions: Fall Precaution Comments: L hemiplegia Restrictions Weight Bearing Restrictions: No General: PT Amount of Missed Time (min): 45 Minutes PT Missed Treatment Reason: Patient unwilling to participate   Therapy/Group: Individual Therapy  Breck Coons, PT, DPT 11/07/2020, 4:48 PM

## 2020-11-07 NOTE — Patient Care Conference (Signed)
Inpatient RehabilitationTeam Conference and Plan of Care Update Date: 11/07/2020   Time: 10:44 AM    Patient Name: Sean [name]      Medical Record Number: [id number]  Date of Birth: [birth year] Sex: Male         Room/Bed: 4W09C/4W09C-01 Payor Info: Payor: Theme park manager / Plan: [employer] OTHER / Product Type: *No Product type* /    Admit Date/Time:  10/26/2020  3:01 PM  Primary Diagnosis:  Brain tumor [facility])  Hospital Problems: Principal Problem:   Brain tumor [facility]) Active Problems:   Malignant melanoma [facility])    Expected Discharge Date: Expected Discharge Date: 11/09/20  Team Members Present: Physician leading conference: Dr. Alger Simons Care Coodinator Present: Dorthula Nettles, RN, BSN, CRRN;Loralee Pacas, LCSWA Nurse Present: Mohammed Kindle, RN PT Present: Tereasa Coop, PT OT Present: Cherylynn Ridges, OT SLP Present: Weston Anna, SLP PPS Coordinator present : Gunnar Fusi, SLP     Current Status/Progress Goal Weekly Team Focus  Bowel/Bladder   Patient is continence B/B. LBM 11/05/20  Patient will manitain normal B/b pattern  Toilet patient PRN on every shift   Swallow/Nutrition/ Hydration             ADL's   CGA/supervision for transfers, mod A BADLs 2/2 attention/innitiatiob  Supervision/min A  dc planning, cognitioond, education   Mobility   CGA to supervisoin all mobility and gait up to 300'  supervision  DC prep   Communication   Mod A  Min A  intitation of verbal expression   Safety/Cognition/ Behavioral Observations  Max A  Min A  attention, initiation   Pain   Patient pain is manage well by current pain medications  Assess patient for pain on every shift and administer PRN medication  Notify MD if patient's pain is not relief by current pain medications   Skin               Discharge Planning:  D/c to home with support from wife, and siblings who will all rotate providing care as wife works during the day.   Team Discussion: MD  questioning if Ritalin doing anything? He is set up for immunotherapy. Continent B/B, lethargic this morning, but waking up now. Ready for discharge on Thursday.  Patient on target to meet rehab goals: Hard to initiate, total assist to get pants on. More behavioral? Cognition not improving. He reports that he is depressed. Refused PT today. Responses are vague but he is responding. Family education is complete.  *See Care Plan and progress notes for long and short-term goals.   Revisions to Treatment Plan:  MD starting Celexa tonight at HS. Teaching Needs: Family education, medication management, transfer training, gait training, endurance training, initiation and cognitive training, skin/wound care.  Current Barriers to Discharge: Inaccessible home environment, Decreased caregiver support, Medical stability, Home enviroment access/layout, Wound care, Lack of/limited family support, Medication compliance, Pending chemo/radiation and Behavior  Possible Resolutions to Barriers: Continue current medications, offer nutritional support, provide emotional support to patient and family.     Medical Summary Current Status: decadron taper. immunotherapy per oncology. ritalin trial for attention/initiation  Barriers to Discharge: Medical stability   Possible Resolutions to [employer]: daily med assessment and titration. daily lab/data review   Continued Need for Acute Rehabilitation Level of Care: The patient requires daily medical management by a physician with specialized training in physical medicine and rehabilitation for the following reasons: Direction of a multidisciplinary physical rehabilitation program to maximize functional independence : Yes Medical management  of patient stability for increased activity during participation in an intensive rehabilitation regime.: Yes Analysis of laboratory values and/or radiology reports with any subsequent need for medication adjustment  and/or medical intervention. : Yes   I attest that I was present, lead the team conference, and concur with the assessment and plan of the team.   Cristi Loron 11/07/2020, 3:50 PM

## 2020-11-07 NOTE — Progress Notes (Signed)
Physical Therapy Session Note  Patient Details  Name: Sean [name] MRN: [id number] Date of Birth: 01-Jan-1966  Today's Date: 11/07/2020 PT Individual Time: 1030-1045 and 1520-1530 PT Individual Time Calculation (min): 15 min and 10 min   Short Term Goals: Week 2:  PT Short Term Goal 1 (Week 2): STGs=LTGs  Skilled Therapeutic Interventions/Progress Updates:     Session 1: Patient in bed with his eyes closed upon PT arrival. Patient opened his eyes to verbal stimulation, however was not verbally responsive except for perseverating on "mhm." Attempted to sit patient EOB, however, patient not responsive to cues and brought his legs back on the bed when therapist took them off the bed with total A. Vitals: BP111/71, HR 101, SPO2 95% on RA. Patient in no signs of distress. Patient continuing to perseverate on "mhm" when asked about pain and fatigue, however sated "I am not too dizzy" when asked about dizziness. PT then inquired about feeling of sadness or depression and patient became quiet and tearful. PT provided education on coping strategies and benefits of communicating with rehab team about his feeling. Patient remained quiet and tearful throughout and nodded during education. Provided patient with further education on benefits of mobility for d/c home so he can continue to enjoy activities at home with his family. Provided patient with another opportunity to sit up, patient stated, "in a few minutes." PT stepped out of the room for 2 min then returned. Patient continued to decline mobility at this time, lead therapists made aware of patient's behavior during session.  Patient in bed at end of session with breaks locked, bed alarm set, and all needs within reach. Patient missed 15 min of skilled PT due to refusal, RN made aware. Will attempt to make-up missed time as able.    Session 2: Patient in bed upon PT arrival. Patient alert and communicating better this afternoon, greeted therapist  appropriately and verbally agreeable to getting OOB. Patient did not initiate any mobility with cues or facilitation, then perseverated on asking, "what are you doing." Patient then incontinent of bladder in the bed. Educated patient on need to get OOB to change soiled clothes and linens, however patient continued to not respond to cues or facilitation to sit EOB. NT made aware of incontinence and patient handed off for nursing care due to poor participation in skilled therapy.  Patient in bed at end of session with breaks locked, bed alarm set, and all needs within reach. Patient missed 20 min of skilled PT due to nursing care, RN made aware. Will attempt to make-up missed time as able.     Therapy Documentation Precautions:  Precautions Precautions: Fall Precaution Comments: L hemiplegia Restrictions Weight Bearing Restrictions: No   Therapy/Group: Individual Therapy  Undra Trembath L Rosalea Withrow PT, DPT  11/07/2020, 4:54 PM

## 2020-11-07 NOTE — Progress Notes (Addendum)
Occupational Therapy Discharge Summary  Patient Details  Name: Sean [name] MRN: [id number] Date of Birth: 04-Jul-1966  Patient has met 9 of 14 long term goals due to improved activity tolerance, improved balance, postural control, ability to compensate for deficits and functional use of  LEFT upper and LEFT lower extremity.  Patient to discharge at [facility] Assist level.  Patient's care partner is independent to provide the necessary physical and cognitive assistance at discharge.    Reasons goals not met: Patient continues to have severe cognitive deficits in the areas of attention and initiation greatly affecting his ability to participate in BADL tasks.   Recommendation:  Patient will benefit from ongoing skilled OT services in home health setting to continue to advance functional skills in the area of BADL and Reduce care partner burden.  Equipment: 3-in1 BSC, tub transfer bench  Reasons for discharge: treatment goals met and discharge from hospital  Patient/family agrees with progress made and goals achieved: Yes  OT Discharge Precautions/Restrictions  Precautions Precautions: Fall Restrictions Weight Bearing Restrictions: No General OT Amount of Missed Time: 30 Minutes ADL ADL Eating: Supervision/safety,Maximal cueing Grooming: Minimal assistance,Maximal cueing Upper Body Bathing: Minimal assistance,Maximal cueing Lower Body Bathing: Moderate assistance,Maximal cueing Upper Body Dressing: Minimal assistance,Maximal cueing Lower Body Dressing: Moderate assistance,Maximal cueing Toileting: Minimal assistance,Maximal cueing Toilet Transfer: Contact guard Tub/Shower Transfer: Curator: Minimal assistance Cognition Overall Cognitive Status: Impaired/Different from baseline Arousal/Alertness: Awake/alert Orientation Level: Disoriented to person Sustained Attention: Impaired Memory: Impaired Awareness: Impaired Problem Solving:  Impaired Executive Function: Initiating Initiating: Impaired Initiating Impairment: Verbal basic;Functional basic Behaviors: Impulsive;Perseveration Safety/Judgment: Impaired Sensation Sensation Light Touch: Appears Intact Coordination Gross Motor Movements are Fluid and Coordinated: No Fine Motor Movements are Fluid and Coordinated: No Coordination and Movement Description: decreased smoothness and accuracy with L UE, improved since eval Motor  Motor Motor: Hemiplegia Motor - Discharge Observations: L UE continues to be slightly weaker, improved since eval Mobility  Bed Mobility Supine to Sit: Supervision/Verbal cueing Sit to Supine: Supervision/Verbal cueing Transfers Sit to Stand: Contact Guard/Touching assist Stand to Sit: Contact Guard/Touching assist  Balance Static Sitting Balance Static Sitting - Balance Support: Feet supported Static Sitting - Level of Assistance: 5: Stand by assistance Dynamic Sitting Balance Dynamic Sitting - Balance Support: During functional activity Dynamic Sitting - Level of Assistance: 5: Stand by assistance Static Standing Balance Static Standing - Balance Support: During functional activity Static Standing - Level of Assistance: 5: Stand by assistance Dynamic Standing Balance Dynamic Standing - Balance Support: During functional activity Dynamic Standing - Level of Assistance: 5: Stand by assistance Extremity/Trunk Assessment RUE Assessment RUE Assessment: Within Functional Limits LUE Assessment LUE Assessment: Exceptions to [facility] LUE Strength LUE Overall Strength Comments: 3+/5 Left Shoulder Flexion: 3+/5   Daneen Schick Oralee Rapaport 11/07/2020, 4:15 PM

## 2020-11-07 NOTE — Progress Notes (Signed)
Speech Language Pathology Daily Session Note  Patient Details  Name: Sean [name] MRN: [id number] Date of Birth: 02-25-1966  Today's Date: 11/07/2020 SLP Individual Time: 0725-0755 SLP Individual Time Calculation (min): 30 min  Short Term Goals: Week 2: SLP Short Term Goal 1 (Week 2): STGs=LTGs due to ELOS  Skilled Therapeutic Interventions: Skilled treatment session focused on cognitive goals. SLP facilitated session by providing more than a reasonable amount of time and overall Max A multimodal cues to initiate sitting EOB. Despite this, patient unable to initiate sitting EOB and patient was repositioned in bed with total + 2 assist. Patient was extremely impulsive with PO intake and SLP provided Max A verbal and tactile cues to manage bolus size. Patient consumed meal without overt s/s of aspiration. Patient with minimal verbalizations throughout session and unable to provide specific answerers, even when given choices from a field of 2. Patient left sitting upright in bed with alarm on and all needs within reach. Continue with current plan of care.      Pain No/Denies Pain   Therapy/Group: Individual Therapy  Anairis Knick 11/07/2020, 10:06 AM

## 2020-11-07 NOTE — Progress Notes (Addendum)
PROGRESS NOTE   Subjective/Complaints:  pt up in bed. No new complaints. Taking morning meds. Didn't each much breakfast.   ROS: Limited due to cognitive/behavioral    Objective:   No results found. No results for input(s): WBC, HGB, HCT, PLT in the last 72 hours. No results for input(s): NA, K, CL, CO2, GLUCOSE, BUN, CREATININE, CALCIUM in the last 72 hours.  Intake/Output Summary (Last 24 hours) at 11/07/2020 1021 Last data filed at 11/06/2020 1810 Gross per 24 hour  Intake 598 ml  Output --  Net 598 ml        Physical Exam: Vital Signs Blood pressure 127/71, pulse 76, temperature 97.8 F (36.6 C), resp. rate 18, height 5\' 11"  (1.803 m), weight 95 kg, SpO2 99 %. Constitutional: No distress . Vital signs reviewed. HEENT: EOMI, oral membranes moist Neck: supple Cardiovascular: RRR without murmur. No JVD    Respiratory/Chest: CTA Bilaterally without wheezes or rales. Normal effort    GI/Abdomen: BS +, non-tender, non-distended Ext: no clubbing, cyanosis, or edema Psych: pleasant but flat.  Skin: intact Neuro: alert. Still with limited insight and awareness. Did recall the new date of discharge.   RUE and RLE grossly 5/5. LUE 3+ to 4/5 with apraxia, inattention. LLE 3+ to 4/5. Ongoing motor apraxia, left more than right. Senses LT and pain in all 4's.   Musculoskeletal: Full ROM, No pain with AROM or PROM in the neck, trunk, or extremities. Posture appropriate      Assessment/Plan: 1. Functional deficits which require 3+ hours per day of interdisciplinary therapy in a comprehensive inpatient rehab setting.  Physiatrist is providing close team supervision and 24 hour management of active medical problems listed below.  Physiatrist and rehab team continue to assess barriers to discharge/monitor patient progress toward functional and medical goals  Care Tool:  Bathing    Body parts bathed by patient:  Chest,Abdomen,Face,Left arm,Front perineal area,Buttocks,Left upper leg,Right upper leg,Right arm   Body parts bathed by helper: Right lower leg,Face     Bathing assist Assist Level: Moderate Assistance - Patient 50 - 74%     Upper Body Dressing/Undressing Upper body dressing Upper body dressing/undressing activity did not occur (including orthotics): N/A What is the patient wearing?: Pull over shirt    Upper body assist Assist Level: Minimal Assistance - Patient > 75%    Lower Body Dressing/Undressing Lower body dressing    Lower body dressing activity did not occur: N/A What is the patient wearing?: Pants     Lower body assist Assist for lower body dressing: Moderate Assistance - Patient 50 - 74%     Toileting Toileting    Toileting assist Assist for toileting: Contact Guard/Touching assist     Transfers Chair/bed transfer  Transfers assist     Chair/bed transfer assist level: Supervision/Verbal cueing     Locomotion Ambulation   Ambulation assist      Assist level: Contact Guard/Touching assist Assistive device: No Device Max distance: 200   Walk 10 feet activity   Assist     Assist level: Contact Guard/Touching assist Assistive device: No Device   Walk 50 feet activity   Assist    Assist level: Contact  Guard/Touching assist Assistive device: No Device    Walk 150 feet activity   Assist    Assist level: Contact Guard/Touching assist Assistive device: No Device    Walk 10 feet on uneven surface  activity   Assist     Assist level: Minimal Assistance - Patient > 75% Assistive device:  (handrail)   Wheelchair     Assist Will patient use wheelchair at discharge?: No             Wheelchair 50 feet with 2 turns activity    Assist            Wheelchair 150 feet activity     Assist          Blood pressure 127/71, pulse 76, temperature 97.8 F (36.6 C), resp. rate 18, height 5\' 11"  (1.803 m), weight  95 kg, SpO2 99 %.  Medical Problem List and Plan: 1.  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   -Follow-up oncology service Dr. Betsy Coder as well as Dr. Mickeal Skinner ongoing plan of care.    -chest CT 2/16 demonstrated progression of disease, MRI of brain demonstrated stable appearance of mets.   -decadron to be decreased to 2mg  daily starting 2/23  -outpt immunotherapy after discharge             -patient may  shower             -ELOS/Goals: moved up DC date to 2/24 per family request  --Continue CIR therapies including PT, OT, and SLP   2.  Antithrombotics: -DVT/anticoagulation: SCDs             -antiplatelet therapy: N/A 3. Pain Management: Continue Tylenol and oxycodone as needed for headaches, well controlled on 2/17   -using oxycodone sparingly  -have asked him to let us know if his pain is not controlled 4. Mood: Continue Melatonin 5 mg nightly as needed sleep             -antipsychotic agents: N/A  2/22 -pt reports depression to staff. I have been concerned about this   -will begin trial of celexa 10mg  qhs    5. Neuropsych: This patient is not capable of making decisions on his own behalf.  -continue trial of ritalin 10mg  bid--seems to have helped somewhat with initiation although I'm not overwhelmed.   -2/22 discussed ritalin effect with team today in conference--really hasn't made much of a difference. Will wean to off prior to d/c. 6. Skin/Wound Care: Routine skin checks. May removing remaining suture- will placed nursing order. May d/c IV- nursing order placed 7. Fluids/Electrolytes/Nutrition: pt admit that he doesn't have a great appetite  -continues to eat well for the most part    -continue protein supp for low albumin 8.  Seizure prophylaxis. Conitnue Keppra 500 mg twice daily 9.  . Constipation  -scheduled senna-s  -had large  BMs 2/19 10. Leukocytosis: d/t steroids 11. Hypertension: bp well controlled       LOS: 12 days A FACE TO FACE EVALUATION WAS PERFORMED  Meredith Staggers 11/07/2020, 10:21 AM

## 2020-11-07 NOTE — Progress Notes (Signed)
Occupational Therapy Session Note  Patient Details  Name: Sean [name] MRN: [id number] Date of Birth: [birth year]  Today's Date: 11/07/2020 OT Individual Time: 1005-1030 OT Individual Time Calculation (min): 25 min   Short Term Goals: Week 2:  OT Short Term Goal 1 (Week 2): LTG=STG 2/2 ELOS  Skilled Therapeutic Interventions/Progress Updates:    Pt greeted semi-reclined in bed with only his brief 1/2 way on. Pt with eyes closed and minimally responsive to OT commands. Pt would open eyes briefly and murmer "mmhmm" when OT asked questions, but otherwise extremely lethargic. Pt noted to have been incontinent of urine. OT provided total A for peri-care and brief change. Pt would not assist with rolling in bed and was pushing back against OT requiring total A for rolling. Total A to don pants and rolling again total A to pull them up. Pt did initiate helping pull shirt down slightly, but still overall total A for this task. Pt left semi-reclined in bed with bed alarm on and needs met.   Therapy Documentation Precautions:  Precautions Precautions: Fall Precaution Comments: L hemiplegia Restrictions Weight Bearing Restrictions: No Pain: Denies pain  Therapy/Group: Individual Therapy  Valma Cava 11/07/2020, 10:24 AM

## 2020-11-08 ENCOUNTER — Telehealth: Payer: Self-pay | Admitting: Oncology

## 2020-11-08 DIAGNOSIS — C7931 Secondary malignant neoplasm of brain: Secondary | ICD-10-CM

## 2020-11-08 MED ORDER — CITALOPRAM HYDROBROMIDE 10 MG PO TABS: 10.0000 mg | ORAL_TABLET | Freq: Every day | ORAL | 0 refills | Status: DC

## 2020-11-08 MED ORDER — SENNOSIDES-DOCUSATE SODIUM 8.6-50 MG PO TABS: 2.0000 | ORAL_TABLET | Freq: Every day | ORAL | Status: DC

## 2020-11-08 MED ORDER — DEXAMETHASONE 2 MG PO TABS: 2.0000 mg | ORAL_TABLET | Freq: Every day | ORAL | 0 refills | Status: DC

## 2020-11-08 MED ORDER — MELATONIN 5 MG PO TABS: 5.0000 mg | ORAL_TABLET | Freq: Every evening | ORAL | 0 refills | Status: DC | PRN

## 2020-11-08 MED ORDER — LEVETIRACETAM 500 MG PO TABS
500.0000 mg | ORAL_TABLET | Freq: Two times a day (BID) | ORAL | 0 refills | Status: DC
Start: 2020-11-08 — End: 2021-01-29

## 2020-11-08 MED ORDER — PANTOPRAZOLE SODIUM 40 MG PO TBEC: 40.0000 mg | DELAYED_RELEASE_TABLET | Freq: Every day | ORAL | 0 refills | Status: DC

## 2020-11-08 MED ORDER — ACETAMINOPHEN 325 MG PO TABS
650.0000 mg | ORAL_TABLET | Freq: Four times a day (QID) | ORAL | Status: DC | PRN
Start: 2020-11-08 — End: 2021-01-29

## 2020-11-08 NOTE — Progress Notes (Addendum)
Speech Language Pathology Discharge Summary  Patient Details  Name: Sean [name] MRN: [id number] Date of Birth: [birth year]  Today's Date: 11/08/2020 SLP Individual Time: 9480-1655 SLP Individual Time Calculation (min): 15 min and Today's Date: 11/08/2020 SLP Missed Time: 27 Minutes Missed Time Reason: Patient unwilling to participate   Skilled Therapeutic Interventions:  Skilled treatment session focused on cognitive goals. Upon arrival, patient was awake in bed watching television. Patient declined to get out of bed to perform functional tasks despite Max encouragement but was initially agreeable to participating in a cognitive assessment. Patient unable to recall current age, date, day of the week or city. When SLP attempted to correct/cue the patient, the patient grabbed his remote and turned up the volume of the television. He then stopped verbally responding to clinician. Therefore, patient missed remaining 45 minutes of session. Patient left upright in bed with alarm on and all needs within reach.   Patient has met 0 of 4 long term goals.  Patient to discharge at overall Max level.   Reasons goals not met: Patient continues to require overall Max A to complete functional tasks due to poor initiation and attention. Patient with limited participation and with a shorter length of stay than originally planned.    Clinical Impression/Discharge Summary: Patient has made limited gains and has not met any LTG consistently this reporting period. Currently, patient continues to demonstrate severe cognitive impairments with significant deficits in initiation and attention. Because of this, patient requires overall Max A multimodal cues to complete functional and familiar tasks safely. Poor initiation also impacts overall verbal expression and when patient does communicate, answers tend to be vague with limited information. Patient and family education is complete and patient will discharge home with 24  hour supervision from family. Patient would benefit from f/u SLP services to maximize his cognitive-linguistic function and overall functional independence in order to reduce caregiver burden.   Care Partner:  Caregiver Able to Provide Assistance: Yes  Type of Caregiver Assistance: Physical;Cognitive  Recommendation:  24 hour supervision/assistance;Home Health SLP  Rationale for SLP Follow Up: Reduce caregiver burden;Maximize functional communication;Maximize cognitive function and independence   Equipment: N/A   Reasons for discharge: Discharged from hospital   Patient/Family Agrees with Progress Made and Goals Achieved: Yes    [place], [place] 11/08/2020, 6:32 AM

## 2020-11-08 NOTE — Progress Notes (Signed)
Occupational Therapy Session Note  Patient Details  Name: Sean [name] MRN: [id number] Date of Birth: 05/18/1966  Today's Date: 11/08/2020 OT Missed Time: 87 Minutes Missed Time Reason: Patient unwilling/refused to participate without medical reason   Pt received in bed keeping head turned away from OT and shaking head no to participate in any tx options. Pt nods when OT asks if pt wants to stay in bed. Exited session with pt seated in bed missing 30  min OT, exit alarm on and call light in reach  Kelsey [facility] 11/08/2020, 12:48 PM

## 2020-11-08 NOTE — Progress Notes (Signed)
HEMATOLOGY-ONCOLOGY PROGRESS NOTE  SUBJECTIVE: No complaint. No headache.   Oncology History  Melanoma of skin ([place])  10/05/2020 Initial Diagnosis   Melanoma of skin ([place])   10/05/2020 Cancer Staging   Staging form: Melanoma of the Skin, AJCC 8th Edition - Pathologic: Stage IV (pTX, pNX, pM1) - Signed by [name], MD on 10/05/2020   10/16/2020 -  Chemotherapy    Patient is on Treatment Plan: MELANOMA NIVOLUMAB + IPILIMUMAB (1/3) Q21D / NIVOLUMAB Q14D       PHYSICAL EXAMINATION:  Vitals:   11/07/20 2025 11/08/20 0626  BP: 115/79 112/81  Pulse: 87 66  Resp: 14 16  Temp: 98 F (36.7 C) 98.8 F (37.1 C)  SpO2: 96% 96%   Filed Weights   10/26/20 1518  Weight: 209 lb 7 oz (95 kg)    Intake/Output from previous day: No intake/output data recorded.  GENERAL: Awake and alert, periods of confusion SKIN: skin color, texture, turgor are normal, no rashes or significant lesions OROPHARYNX: No thrush ABDOMEN:abdomen soft, non-tender and normal bowel sounds NEURO: Arousable, oriented to diagnosis and year, follows simple commands, moves all extremities to command , unable to identify the number of fingers I am holding up, unable to read the wall clock  LABORATORY DATA:  I have reviewed the data as listed CMP Latest Ref Rng & Units 10/27/2020 10/24/2020 10/22/2020  Glucose 70 - 99 mg/dL 117(H) 110(H) 167(H)  BUN 6 - 20 mg/dL 19 21(H) 19  Creatinine 0.61 - 1.24 mg/dL 0.75 0.69 0.77  Sodium 135 - 145 mmol/L 137 137 139  Potassium 3.5 - 5.1 mmol/L 4.4 3.4(L) 4.0  Chloride 98 - 111 mmol/L 98 102 105  CO2 22 - 32 mmol/L 28 23 23   Calcium 8.9 - 10.3 mg/dL 9.1 9.2 9.0  Total Protein 6.5 - 8.1 g/dL 6.3(L) - -  Total Bilirubin 0.3 - 1.2 mg/dL 0.9 - -  Alkaline Phos 38 - 126 U/L 87 - -  AST 15 - 41 U/L 34 - -  ALT 0 - 44 U/L 100(H) - -    Lab Results  Component Value Date   WBC 15.3 (H) 10/27/2020   HGB 14.1 10/27/2020   HCT 41.2 10/27/2020   MCV 90.7 10/27/2020   PLT 264  10/27/2020   NEUTROABS 10.4 (H) 10/27/2020    DG Chest 2 View  Result Date: 10/20/2020 CLINICAL DATA:  Altered mental status EXAM: CHEST - 2 VIEW COMPARISON:  PET-CT September 25, 2020 FINDINGS: The heart size and mediastinal contours are within normal limits. Streaky left basilar opacity not significantly changed from prior favor atelectasis. Known right upper lobe pulmonary nodule not visualized on today's exam. No pleural effusion. No pneumothorax. The visualized skeletal structures are unremarkable. IMPRESSION: Streaky left basilar opacity, favor atelectasis though developing infection not excluded. Electronically Signed   By: Dahlia Bailiff MD   On: 10/20/2020 17:55   CT HEAD WO CONTRAST  Result Date: 10/25/2020 CLINICAL DATA:  Altered level of consciousness, metastatic melanoma EXAM: CT HEAD WITHOUT CONTRAST TECHNIQUE: Contiguous axial images were obtained from the base of the skull through the vertex without intravenous contrast. COMPARISON:  10/20/2020, 10/24/2020 FINDINGS: Brain: Unenhanced images of the brain again demonstrate numerous hemorrhagic metastases compatible with known metastatic melanoma. The size and number of lesions is unchanged. Diffuse areas of vasogenic edema are seen throughout the bilateral cerebral hemispheres, with effacement of the lateral ventricles right greater than left. No significant midline shift. No acute infarct. No acute extra-axial fluid collections.  Vascular: No hyperdense vessel or unexpected calcification. Skull: Normal. Negative for fracture or focal lesion. Stable postsurgical changes from prior left frontal and left occipital craniotomy. Sinuses/Orbits: No acute finding. Other: None. IMPRESSION: 1. Stable diffuse intracranial metastases compatible with metastatic melanoma. Stable vasogenic edema and effacement of the lateral ventricles. 2. No significant change since recent MRI and head CT. Electronically Signed   By: Randa Ngo M.D.   On: 10/25/2020 22:56    CT Head Wo Contrast  Result Date: 10/20/2020 CLINICAL DATA:  Mental status change, unknown cause. EXAM: CT HEAD WITHOUT CONTRAST TECHNIQUE: Contiguous axial images were obtained from the base of the skull through the vertex without intravenous contrast. COMPARISON:  MRI head 09/30/20 FINDINGS: Brain: Interval increase in number and size of numerous supratentorial hemorrhagic metastases. The largest lesion on the left measures up to 5 point 1 cm in the left frontal lobe. The largest lesion on the right measures up to 3.3 cm in the right frontal lobe. Of note, there is a hemorrhagic lesion in the right thalamus. There is exuberant vasogenic edema associated with these lesions. No substantial midline shift. There is significant effacement of the right lateral ventricle. No hydrocephalus. Infratentorially, there is an 8 mm hemorrhagic metastasis within the superior right cerebellum. Vascular: No hyperdense vessel identified. Skull: No acute fracture. Prior left frontal and left parietal craniotomy. Sinuses/Orbits: No acute findings. Other: No mastoid effusions. IMPRESSION: Significant interval increase in number and size of numerous infratentorial and supratentorial hemorrhagic metastases with exuberant edema, as detailed above. Resulting effacement of the right lateral ventricle. An MRI with contrast could provide more complete evaluation if clinically indicated. Findings discussed with Dr. Gilford Raid Via telephone at 6:32 PM. Electronically Signed   By: Margaretha Sheffield MD   On: 10/20/2020 18:35   CT CHEST W CONTRAST  Result Date: 11/01/2020 CLINICAL DATA:  Skin cancer.  Restaging. EXAM: CT CHEST WITH CONTRAST TECHNIQUE: Multidetector CT imaging of the chest was performed during intravenous contrast administration. CONTRAST:  17mL OMNIPAQUE IOHEXOL 300 MG/ML  SOLN COMPARISON:  PET-CT 09/25/2020.  Chest CT 12 30/21. FINDINGS: Cardiovascular: The heart size is normal. No substantial pericardial effusion. No  thoracic aortic aneurysm. Mediastinum/Nodes: Small right paratracheal lymph nodes evident. 8 mm short axis right hilar node visible on image 70/series 3. Immediately adjacent right hilar lymph node measures 8 mm short axis on 72/3. No left hilar lymphadenopathy. The esophagus has normal imaging features. There is no axillary lymphadenopathy. Lungs/Pleura: Anterior right upper lobe pulmonary nodule measures 12 mm compared to 10 mm on previous PET-CT. 7 mm posterior right upper lobe nodule on 69/5 was 3 mm previously (remeasured). 8 mm right lower lobe nodule on 80/5 was 4 mm previously (remeasured). Index nodule anterior left upper lobe on 88/5 measures 5 mm today compared to 3 mm previously (remeasured). Basilar atelectasis.  No pleural effusion. Upper Abdomen: 2.6 cm low-density lesion posterior right hepatic dome was not visible on previous PET-CT and has progressed since abdominal MRI 09/15/2020 when it was 1.3 cm (remeasured). 1.5 cm caudate lesion was not visible on prior imaging. 2.2 cm inferior right liver lesion on 163/3 measured 9 mm on previous MRI. Musculoskeletal: Subacute posterior right tenth rib fracture noted. IMPRESSION: 1. Interval progression of bilateral pulmonary nodules, concerning for metastatic disease. 2. Small right hilar lymph nodes. Hypermetabolism is seen in the right hilum on previous PET-CT. Close attention on follow-up recommended. 3. New and progressive ill-defined lesions in the liver when comparing to abdominal MRI of 09/15/2020. Imaging  features compatible with progression of metastatic involvement. Electronically Signed   By: Misty Stanley M.D.   On: 11/01/2020 14:33   MR Brain W Wo Contrast  Result Date: 11/01/2020 CLINICAL DATA:  Metastatic melanoma EXAM: MRI HEAD WITHOUT AND WITH CONTRAST TECHNIQUE: Multiplanar, multiecho pulse sequences of the brain and surrounding structures were obtained without and with intravenous contrast. CONTRAST:  52mL GADAVIST GADOBUTROL 1 MMOL/ML  IV SOLN COMPARISON:  10/24/2020 FINDINGS: Brain: Numerous intrinsically T1 hyperintense lesions are again identified throughout the brain. As before, there are approximately 20 lesions. No substantial change in size over the short interval. Associated edema is similar. As before, there is partial effacement the right lateral ventricle with mild leftward midline shift. No hydrocephalus. No acute infarction. Susceptibility associated with the metastatic lesions. Vascular: Major vessel flow voids at the skull base are preserved. Skull and upper cervical spine: Prior craniotomies. Normal marrow signal is preserved. Sinuses/Orbits: Paranasal sinuses are aerated. Orbits are unremarkable. Other: Sella is unremarkable.  Mastoid air cells are clear. IMPRESSION: Stable appearance multiple intracranial metastases and associated edema and mild mass effect. Electronically Signed   By: Macy Mis M.D.   On: 11/01/2020 15:36   MR Brain W Wo Contrast  Result Date: 10/24/2020 CLINICAL DATA:  Metastatic melanoma EXAM: MRI HEAD WITHOUT AND WITH CONTRAST TECHNIQUE: Multiplanar, multiecho pulse sequences of the brain and surrounding structures were obtained without and with intravenous contrast. CONTRAST:  39mL GADAVIST GADOBUTROL 1 MMOL/ML IV SOLN COMPARISON:  None. FINDINGS: Brain: There are numerous lesions throughout the brain, many of which are new and/or have increased in size. There are approximately 20 lesions. The largest lesions are in the left frontal operculum (3.4 x 3.0 cm, 11:15), right basal ganglia (3.5 x 2.5 cm, 11:13) and right occipital lobe (2.8 x 1.9 cm, 16:26). The lesions demonstrate intrinsic hyperintense T1-weighted signal. Beyond that, there is little demonstrated contrast enhancement. There is new mass effect on the right lateral ventricle without significant midline shift. There is moderate vasogenic edema surrounding lesions in both frontal lobes, the right basal ganglia, both occipital lobes and both  parietal lobes. Vascular: Normal flow voids. Skull and upper cervical spine: Normal marrow signal. Remote left anterior and posterior craniotomies. Sinuses/Orbits: Negative. Other: None. IMPRESSION: 1. Progression of metastatic melanoma with multiple new or increased size lesions. 2. Moderate vasogenic edema surrounding lesions in both frontal lobes, right basal ganglia, both occipital lobes and both parietal lobes. 3. New mass effect on the right lateral ventricle without significant midline shift. Electronically Signed   By: Ulyses Jarred M.D.   On: 10/24/2020 03:10     ASSESSMENT AND PLAN: 1. Multiple brain metastases, unknown primary tumor site  CT brain 09/14/2020-multiple hemorrhagic metastases with surrounding edema in the bilateral cerebral hemispheres  CTs chest, abdomen, pelvis 09/14/2020-1.4 cm right upper lobe subpleural nodule, 0.5 cm right lower lobe nodule, multiple hypodensities in the liver suspicious for metastases, 1.5 cm right upper pole hypodense kidney lesion-indeterminate  MRI abdomen 09/15/2020-multifocal T1 hyperintense liver lesions, no kidney mass  Ultrasound-guided biopsy of a segment 4B liver lesion 09/18/2020-benign liver parenchyma with steatosis, no evidence of malignancy  PET 09/25/2020- focal area of hypermetabolism in the right hilum, multiple areas of the liver, pelvic musculature, and left thigh-no CT correlate with any of these areas on the noncontrast CT, 10 mm anterior right upper lobe nodule with low-level hypermetabolism, additional tiny pulmonary nodules too small to characterize by PET  SRS to 10 brain lesions 09/27/2020  Resection of left occipital  and left frontal lesions on 09/29/2020-pathology consistent with malignant melanoma  Cycle 1 ipilimumab/nivolumab 10/16/2020  CT of the brain without contrast 10/26/2020 showed significant interval increase in the number and size of numerous metastases  Brain MRI 10/24/2020-multiple new and increased brain  metastases, moderate edema surrounding multiple lesions, new mass-effect at the right lateral ventricle  Brain MRI 11/01/2020-stable appearance of his multiple intracranial metastases and associated edema and mild mass-effect.  CT of the chest with contrast 11/01/2020-interval progression of bilateral pulmonary nodules concerning for metastatic disease, small right hilar lymph nodes, new and progressive ill-defined lesions in the liver compatible with progression of metastatic involvement.  2.  Left-sided weakness, new onset seizure secondary to #1, weakness has resolved 3.  Hyperlipidemia 4.  Elevated liver enzymes and bilirubin  Liver enzymes more elevated on repeat labs 09/27/2020, improved 09/28/2020 5.  Headache and nausea/vomiting secondary to #1, resolved 6.  Hospital admission 10/20/2020-encephalopathy  Mr. Cutrona appears stable.  He remains confused.  He has difficulty with visual processing.  He is scheduled to be discharged tomorrow.  The Decadron will be tapered to 2 mg daily beginning today.  Outpatient follow-up will be scheduled at the Cancer center for either 11/10/2020 or 11/13/2020 to resume nivolumab treatment.  I will check a CBC, TSH, and chemistry panel on 11/09/2020.   Recommendations: 1.  Decrease dexamethasone to 2 mg daily starting 2/23.    2.  We will plan for outpatient nivolumab once discharged from CIR. 3.  Continue therapy under the care of CIR. 4.  CBC, TSH and CMP 11/09/2020   LOS: 13 days   Betsy Coder 11/08/20

## 2020-11-08 NOTE — Telephone Encounter (Signed)
Called to confirm appts on 2/25  - pt wife is aware.

## 2020-11-08 NOTE — Progress Notes (Signed)
PROGRESS NOTE   Subjective/Complaints: No complaints   ROS: Limited due to cognitive/behavioral    Objective:   No results found. No results for input(s): WBC, HGB, HCT, PLT in the last 72 hours. No results for input(s): NA, K, CL, CO2, GLUCOSE, BUN, CREATININE, CALCIUM in the last 72 hours.  Intake/Output Summary (Last 24 hours) at 11/08/2020 1516 Last data filed at 11/08/2020 0730 Gross per 24 hour  Intake 120 ml  Output --  Net 120 ml        Physical Exam: Vital Signs Blood pressure 120/74, pulse (!) 109, temperature 99.5 F (37.5 C), resp. rate 16, height 5\' 11"  (1.803 m), weight 95 kg, SpO2 92 %. Gen: no distress, normal appearing HEENT: oral mucosa pink and moist, NCAT Cardio: Tachycardic Chest: normal effort, normal rate of breathing Abd: soft, non-distended Ext: no edema Psych: pleasant, normal affect Skin: intact Neuro: alert. Still with limited insight and awareness. Did recall the new date of discharge.   RUE and RLE grossly 5/5. LUE 3+ to 4/5 with apraxia, inattention. LLE 3+ to 4/5. Ongoing motor apraxia, left more than right. Senses LT and pain in all 4's.   Musculoskeletal: Full ROM, No pain with AROM or PROM in the neck, trunk, or extremities. Posture appropriate      Assessment/Plan: 1. Functional deficits which require 3+ hours per day of interdisciplinary therapy in a comprehensive inpatient rehab setting.  Physiatrist is providing close team supervision and 24 hour management of active medical problems listed below.  Physiatrist and rehab team continue to assess barriers to discharge/monitor patient progress toward functional and medical goals  Care Tool:  Bathing    Body parts bathed by patient: Chest,Abdomen,Face,Left arm,Front perineal area,Buttocks,Left upper leg,Right upper leg,Right arm   Body parts bathed by helper: Right lower leg,Face     Bathing assist Assist Level:  Moderate Assistance - Patient 50 - 74%     Upper Body Dressing/Undressing Upper body dressing Upper body dressing/undressing activity did not occur (including orthotics): N/A What is the patient wearing?: Pull over shirt    Upper body assist Assist Level: Minimal Assistance - Patient > 75%    Lower Body Dressing/Undressing Lower body dressing    Lower body dressing activity did not occur: N/A What is the patient wearing?: Pants     Lower body assist Assist for lower body dressing: Moderate Assistance - Patient 50 - 74%     Toileting Toileting    Toileting assist Assist for toileting: Contact Guard/Touching assist     Transfers Chair/bed transfer  Transfers assist     Chair/bed transfer assist level: Supervision/Verbal cueing     Locomotion Ambulation   Ambulation assist      Assist level: Contact Guard/Touching assist Assistive device: No Device Max distance: 200'   Walk 10 feet activity   Assist     Assist level: Contact Guard/Touching assist Assistive device: No Device   Walk 50 feet activity   Assist    Assist level: Contact Guard/Touching assist Assistive device: No Device    Walk 150 feet activity   Assist    Assist level: Contact Guard/Touching assist Assistive device: No Device    Walk  10 feet on uneven surface  activity   Assist     Assist level: Minimal Assistance - Patient > 75% Assistive device:  (handrail)   Wheelchair     Assist Will patient use wheelchair at discharge?: No             Wheelchair 50 feet with 2 turns activity    Assist            Wheelchair 150 feet activity     Assist          Blood pressure 120/74, pulse (!) 109, temperature 99.5 F (37.5 C), resp. rate 16, height 5\' 11"  (1.803 m), weight 95 kg, SpO2 92 %.  Medical Problem List and Plan: 1.  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   -Follow-up oncology  service Dr. Betsy Coder as well as Dr. Mickeal Skinner ongoing plan of care.    -chest CT 2/16 demonstrated progression of disease, MRI of brain demonstrated stable appearance of mets.   -decadron to be decreased to 2mg  daily starting 2/23  -outpt immunotherapy after discharge             -patient may  shower             -ELOS/Goals: moved up DC date to 2/24 per family request  --Continue CIR therapies including PT, OT, and SLP   2.  Antithrombotics: -DVT/anticoagulation: SCDs             -antiplatelet therapy: N/A 3. Pain Management: Continue Tylenol and oxycodone as needed for headaches, well controlled on 2/17   -using oxycodone sparingly  -have asked him to let us know if his pain is not controlled 4. Mood: Continue Melatonin 5 mg nightly as needed sleep             -antipsychotic agents: N/A  2/22 -pt reports depression to staff. I have been concerned about this   -will begin trial of celexa 10mg  qhs    5. Neuropsych: This patient is not capable of making decisions on his own behalf.  -continue trial of ritalin 10mg  bid--seems to have helped somewhat with initiation although I'm not overwhelmed.   -2/22 discussed ritalin effect with team in conference--really hasn't made much of a difference. Will wean to off prior to d/c. 6. Skin/Wound Care: Routine skin checks. May removing remaining suture- will placed nursing order. May d/c IV- nursing order placed 7. Fluids/Electrolytes/Nutrition: pt admit that he doesn't have a great appetite  -continues to eat well for the most part    -continue protein supp for low albumin 8.  Seizure prophylaxis. Conitnue Keppra 500 mg twice daily 9.  . Constipation  -continue scheduled senna-s  -had large  BMs 2/19 10. Leukocytosis: d/t steroids 11. Hypertension: bp well controlled      LOS: 13 days A FACE TO FACE EVALUATION WAS PERFORMED  Clide Deutscher Nameer Summer 11/08/2020, 3:16 PM

## 2020-11-08 NOTE — Progress Notes (Signed)
Physical Therapy Discharge Summary  Patient Details  Name: Sean [name] MRN: [id number] Date of Birth: 08/27/1966  Today's Date: 11/08/2020 PT Missed Time: 50 Minutes Missed Time Reason: Patient unwilling to participate    Patient has met 5 of 8 long term goals due to improved balance and ability to compensate for deficits.  Patient to discharge at an ambulatory level Supervision.   Patient's care partner is independent to provide the necessary physical and cognitive assistance at discharge.  Reasons goals not met: Pt discharging prior to original ELOS. Family attended family ed and will be available 24/7 to provide guarding with ambulation and assistance as necessary.  Equipment: No equipment provided  Reasons for discharge: discharge from hospital  Patient/family agrees with progress made and goals achieved: Yes  PT Discharge Precautions/Restrictions Precautions Precautions: Fall Precaution Comments: L hemiplegia Restrictions Weight Bearing Restrictions: No Pain Pain Assessment Pain Scale: 0-10 Pain Score: 0-No pain Vision/Perception  Praxis Praxis: Impaired  Cognition Overall Cognitive Status: Impaired/Different from baseline Arousal/Alertness: Awake/alert Orientation Level: Oriented to person Behaviors: Impulsive;Perseveration Safety/Judgment: Impaired Sensation Sensation Light Touch: Appears Intact Coordination Gross Motor Movements are Fluid and Coordinated: No Fine Motor Movements are Fluid and Coordinated: No Coordination and Movement Description: decreased smoothness and accuracy with L UE, improved since eval Heel Shin Test: Very slow movements. R Leg seems slightly more coordinated than left. Motor  Motor Motor: Hemiplegia Motor - Skilled Clinical Observations: Left hemibody weaker than R Motor - Discharge Observations: L UE continues to be slightly weaker, improved since eval  Mobility Bed Mobility Bed Mobility: Supine to Sit;Sit to Supine Supine  to Sit: Supervision/Verbal cueing Sit to Supine: Supervision/Verbal cueing Transfers Transfers: Sit to Stand;Stand Pivot Transfers;Stand to Sit Sit to Stand: Supervision/Verbal cueing Stand to Sit: Supervision/Verbal cueing Stand Pivot Transfers: Supervision/Verbal cueing Stand Pivot Transfer Details: Verbal cues for sequencing Transfer (Assistive device): None Locomotion  Gait Ambulation: Yes Gait Assistance: Minimal Assistance - Patient > 75% Gait Distance (Feet): 200 Feet Assistive device: None Gait Assistance Details: Verbal cues for sequencing;Tactile cues for posture;Tactile cues for sequencing;Verbal cues for gait pattern Gait Gait: Yes Gait Pattern: Impaired Gait Pattern: Decreased stride length;Decreased stance time - left Gait velocity: reduced Stairs / Additional Locomotion Stairs: Yes Stairs Assistance: Minimal Assistance - Patient > 75% Stair Management Technique: Two rails Number of Stairs: 12 Height of Stairs: 6 Ramp: Minimal Assistance - Patient >75% Curb: Minimal Assistance - Patient >75% Wheelchair Mobility Wheelchair Mobility: No  Trunk/Postural Assessment  Cervical Assessment Cervical Assessment: Within Functional Limits Thoracic Assessment Thoracic Assessment: Within Functional Limits Lumbar Assessment Lumbar Assessment: Within Functional Limits Postural Control Postural Control: Deficits on evaluation Protective Responses: Moves very slowly  Balance Balance Balance Assessed: Yes Static Sitting Balance Static Sitting - Level of Assistance: 5: Stand by assistance Dynamic Sitting Balance Dynamic Sitting - Level of Assistance: 5: Stand by assistance Static Standing Balance Static Standing - Balance Support: During functional activity Static Standing - Level of Assistance: 5: Stand by assistance Dynamic Standing Balance Dynamic Standing - Balance Support: During functional activity Dynamic Standing - Level of Assistance: 5: Stand by  assistance Extremity Assessment  RLE Assessment RLE Assessment: Exceptions to [facility] General Strength Comments: Grossly 4+/5 LLE Assessment LLE Assessment: Exceptions to [facility] General Strength Comments: Grossly 3/5    Breck Coons, PT, DPT 11/08/2020, 4:00 PM

## 2020-11-08 NOTE — Progress Notes (Addendum)
Patient ID: [name], male   DOB: 08/25/1966, 55 y.o.   MRN: [id number]  SW waiting on follow-up from Angie/[facility] on if they are able to accept referral for pt.   Referral declined By [employer] as insurance is out of network. SW sent referral to Amy/[facility]. SW waiting on follow-up.Amy to have referral reviewed by branch director and will follow-up. *referral declined.   SW sent referral to [facility]. SW waiting on follow-up.   SW met with pt and pt sister [name] in room to discuss above, and informed there will be updates once we are able to secure a HHA.   Loralee Pacas, MSW, [place] Office: [phone] Cell: [phone] Fax: [phone]

## 2020-11-08 NOTE — Consult Note (Signed)
Neuropsychological Consultation   Patient:   Sean Sean   DOB:   1965/10/24  MR Number:  [id number]  Location:  [facility] [id number] [place] [address] Dept: [place]: [phone]           Date of Service:   11/08/2020  Start Time:   1:30 PM End Time:   2:30 PM  Provider/Observer:  Ilean Skill, Psy.D.       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  Patient presented on 10/20/2020 with right-sided weakness and aphasia as well as fatigue over the prior 4 days.  Head CT showed significant interval increase in number and size of numerous infratentorial and supratentorial hemorrhagic metastasis with significant edema.  MRI abdomen also had shown liver lesions but no kidney mass.  Multiple abdominal metastasis and brain metastasis are noted.  There have been multiple new and increased size lesions noted.  Therapy evaluations noted decline in ADLs and mobility and patient was admitted to the comprehensive inpatient unit.  I had initially seen the patient back on 11/01/2020 and the patient has since deteriorated in both cognition as well as engagement.  Reason for Service:  Patient was referred for neuropsychological consultation due to coping and adjustment as well as cognitive changes due to malignant melanoma that has metastasized and numerous GI locations as well as numerous brain metastasis.  Patient has had significant episodes of worsening cognition and confusion with some improvement but overall progressive deterioration cognitively.  Below is the HPI for the current admission.  HPI: Sean Sean is a 55 year old right-handed male with history significant for malignant melanoma with  metastasis to the brain status post cycle 1 ipilimumab/nivolumab on 10/16/2020 followed by Dr. Betsy Coder of oncology services and recently placed on dexamethasone with slow taper.  Per chart review lives with spouse.  1 level home 2 steps to entry.  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  Good family support.  Presented 10/20/2020 with right side weakness and aphasia as well as fatigue x4 days.  Admission chemistries unremarkable aside glucose 104, ALT 48, hemoglobin 15, urine culture multiple species, lactic acid 0.9.  CT of the head showed significant interval increase in number and size of numerous infratentorial and supratentorial hemorrhagic metastasis with exuberant edema.  Resulting effacement of the right lateral ventricle.  Recent MRI of abdomen 09/15/2020 multifocal T1 hyperintense liver lesions, no kidney mass.  He underwent ultrasound-guided biopsy of a segment 4B liver lesion 09/18/2020 benign liver parenchyma with steatosis, no evidence of malignancy.  PET 09/25/2020 focal area of hypermetabolism in the right hilum, multiple areas of the liver, pelvic musculature, and left thigh-no CT correlate with any of these areas on the noncontrast CT, 10 mm anterior right upper lobe nodule with low-level hypermetabolism, additional tiny pulmonary nodules too small to characterize by PET.  Underwent resection of left occipital left frontal lesions 09/29/2020 pathology consistent with malignant melanoma.  MRI of the brain completed 10/24/2020 shows progression of metastatic melanoma with multiple new or increased size lesions.  Moderate vasogenic edema surrounding lesions in both frontal lobes right basal ganglia both occipital lobes and both parietal lobes.  New mass-effect on the right lateral ventricle without significant  midline shift.  Oncology follow-up Dr. Alvy Bimler as well as follow-up Dr. Mickeal Skinner and remains on high-dose intravenous Decadron.  Keppra initiated for CVA  prophylaxis.  Palliative care consulted to establish goals of care.  Patient tolerating a regular consistency diet.  Therapy evaluations completed due to decline in ADLs and mobility patient was admitted for a comprehensive rehab program.  Current Status:  Today, while the patient was awake he alternated between open eyes and closed eyes and gave very regimented answers to questions with attempts to gas at times.  Patient was not oriented to place or situation but did know the year.  That was the extent of his orientation.  The patient confabulated a few answers were at least attempted to confabulate or indicate he knew what was the answer but typically just answered yes to questions.  When posed questions that would be obvious no answers he still indicated positive response.  The patient's cognitive function appears to be continuing to deteriorate and he is not likely to be able to benefit from further therapeutic interventions due to inability to engage, remember or initiate activities.  Behavioral Observation: Sean Sean  presents as a 55 y.o.-year-old Right Caucasian Male who appeared his stated age. his dress was Appropriate and he was Well Groomed and his manners were Appropriate to the situation.  his participation was indicative of Appropriate, Inattentive and Redirectable behaviors.  There were physical disabilities noted.  he displayed an appropriate level of cooperation and motivation.     Interactions:    Active Appropriate and Inattentive  Attention:   abnormal and attention span appeared shorter than expected for age  Memory:   abnormal; remote memory intact, recent memory impaired  Visuo-spatial:  not examined  Speech (Volume):  low  Speech:   normal; non-fluent aphasia with improvement recently  Thought Process:  Coherent and Circumstantial  Though Content:  WNL; not suicidal and not homicidal  Orientation:   person, place and  situation  Judgment:   Fair  Planning:   Poor  Affect:    Blunted and Flat  Mood:    Dysphoric  Insight:   Fair  Intelligence:   normal  Medical History:   Past Medical History:  Diagnosis Date  . Brain lesion 09/14/2020  . Hyperlipidemia   . Liver lesion 09/14/2020         Patient Active Problem List   Diagnosis Date Noted  . Malignant melanoma ([place]) 10/26/2020  . Brain metastasis ([place]) 10/20/2020  . Melanoma of skin ([place]) 10/05/2020  . Status post craniotomy 09/29/2020  . Brain tumor ([facility]) 09/29/2020  . Elevated blood pressure reading 09/15/2020  . Malignant melanoma metastatic to brain ([place]) 09/15/2020  . Uncomplicated alcohol dependence ([place])   . Seizure ([place])   . Hypokalemia   . Hyponatremia   . Elevated LFTs   . Brain metastases ([place]) 09/14/2020    Psychiatric History:  No prior psychiatric history but patient has been going through significant medical complications due to malignant melanoma that has metastasized to the brain as well as numerous other metastasis sites.  Family Med/Psych History:  Family History  Problem Relation Age of Onset  . Osteoarthritis Mother   . Hypertension Father   . Colon polyps Father   . Colon polyps Brother   . Colon polyps Brother   . Colon cancer Neg Hx   . Stomach cancer Neg Hx   . Rectal cancer Neg Hx   . Esophageal cancer Neg Hx   . 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  Patient presented on 10/20/2020 with right-sided weakness and aphasia as well as fatigue over the prior 4 days.  Head CT showed significant interval increase in number and size of numerous infratentorial and supratentorial hemorrhagic metastasis with significant edema.  MRI abdomen also had shown liver lesions but no kidney mass.  Multiple abdominal metastasis and brain metastasis are noted.   There have been multiple new and increased size lesions noted.  Therapy evaluations noted decline in ADLs and mobility and patient was admitted to the comprehensive inpatient unit.  Today, while the patient was awake he alternated between open eyes and closed eyes and gave very regimented answers to questions with attempts to gas at times.  Patient was not oriented to place or situation but did know the year.  That was the extent of his orientation.  The patient confabulated a few answers were at least attempted to confabulate or indicate he knew what was the answer but typically just answered yes to questions.  When posed questions that would be obvious no answers he still indicated positive response.  The patient's cognitive function appears to be continuing to deteriorate and he is not likely to be able to benefit from further therapeutic interventions due to inability to engage, remember or initiate activities.  Disposition/Plan:  The patient was almost completely devoid of self-directed activities or communication.  All the patient appeared to understand specific questions his answers were very regimented and simplistic sometimes only indicating a S-type answer.  The patient was not oriented to place and only as far as time to year.  The patient does not appear to be able to her competent to benefit from any further therapeutic interventions but does not appear to have significant behavioral acting out or behavioral difficulties to complicate his management.  He appears to be very malleable but is showing increasing and continued cognitive decline.  Diagnosis:    Brain tumor ([place]) - Plan: dexamethasone (DECADRON) tablet 4 mg, DISCONTINUED: dexamethasone (DECADRON) tablet 2 mg  Brain metastasis ([facility]) - Plan: dexamethasone (DECADRON) tablet 2 mg, levETIRAcetam (KEPPRA) 500 MG tablet, pantoprazole (PROTONIX) 40 MG tablet, dexamethasone (DECADRON) 2 MG tablet  Melanoma of skin ([place]) - Plan: levETIRAcetam  (KEPPRA) 500 MG tablet, pantoprazole (PROTONIX) 40 MG tablet         Electronically Signed   _______________________ Ilean Skill, Psy.D. Clinical Neuropsychologist

## 2020-11-08 NOTE — Progress Notes (Signed)
Occupational Therapy Session Note  Patient Details  Name: Sean [name] MRN: [id number] Date of Birth: 04/18/1966  Today's Date: 11/08/2020 OT Individual Time: 1500-1534 OT Individual Time Calculation (min): 34 min    Short Term Goals: Week 1:  OT Short Term Goal 1 (Week 1): Pt will complete sit<>stand out of shower with Mod cues OT Short Term Goal 1 - Progress (Week 1): Progressing toward goal OT Short Term Goal 2 (Week 1): Pt will complete 1 step of UB dressing task with mod questioinong cues OT Short Term Goal 2 - Progress (Week 1): Met OT Short Term Goal 3 (Week 1): Pt will complete 1 step of tolieting task OT Short Term Goal 3 - Progress (Week 1): Met Week 2:  OT Short Term Goal 1 (Week 2): LTG=STG 2/2 ELOS  Skilled Therapeutic Interventions/Progress Updates:    patient in bed, alert, confused, able to state year and "[place]" but disoriented to place.  He responds "yes" to activity but unable to initiate.  Completed hand washing when wash cloth placed in hand.  Does not wash face with cues.  Put lotion on leg after put in hand.  Completed a few repetitions of arm exercises and demonstrated strength when asked.   Moved from supine to sitting edge of bed with min A and gentle cues.   Slid shoes on once started onto foot.  Sit to stand and short distance ambulation to chair without arms CGA.  Unable to return to standing with assist of one - he states that he will stand but unable to motor plan.  Stood with min A of 2 and able to maintain stance and walk to bed with guidance.  Sit to supine with assistance to start legs into bed.  He remained in bed at close of session, bed alarm set and call bell in reach.    Therapy Documentation Precautions:  Precautions Precautions: Fall Precaution Comments: L hemiplegia Restrictions Weight Bearing Restrictions: No  Therapy/Group: Individual Therapy  Carlos Levering 11/08/2020, 7:40 AM

## 2020-11-09 ENCOUNTER — Other Ambulatory Visit: Payer: Self-pay | Admitting: Radiation Therapy

## 2020-11-09 ENCOUNTER — Encounter: Payer: Self-pay | Admitting: *Deleted

## 2020-11-09 DIAGNOSIS — C7931 Secondary malignant neoplasm of brain: Secondary | ICD-10-CM

## 2020-11-09 DIAGNOSIS — C7949 Secondary malignant neoplasm of other parts of nervous system: Secondary | ICD-10-CM

## 2020-11-09 LAB — CBC WITH DIFFERENTIAL/PLATELET
Abs Immature Granulocytes: 0.17 10*3/uL — ABNORMAL HIGH (ref 0.00–0.07)
Basophils Absolute: 0 10*3/uL (ref 0.0–0.1)
Basophils Relative: 0 %
Eosinophils Absolute: 0 10*3/uL (ref 0.0–0.5)
Eosinophils Relative: 0 %
HCT: 37.6 % — ABNORMAL LOW (ref 39.0–52.0)
Hemoglobin: 13.6 g/dL (ref 13.0–17.0)
Immature Granulocytes: 1 %
Lymphocytes Relative: 13 %
Lymphs Abs: 1.6 10*3/uL (ref 0.7–4.0)
MCH: 32.2 pg (ref 26.0–34.0)
MCHC: 36.2 g/dL — ABNORMAL HIGH (ref 30.0–36.0)
MCV: 88.9 fL (ref 80.0–100.0)
Monocytes Absolute: 1.3 10*3/uL — ABNORMAL HIGH (ref 0.1–1.0)
Monocytes Relative: 10 %
Neutro Abs: 9.6 10*3/uL — ABNORMAL HIGH (ref 1.7–7.7)
Neutrophils Relative %: 76 %
Platelets: 169 10*3/uL (ref 150–400)
RBC: 4.23 MIL/uL (ref 4.22–5.81)
RDW: 14.1 % (ref 11.5–15.5)
WBC: 12.7 10*3/uL — ABNORMAL HIGH (ref 4.0–10.5)
nRBC: 0 % (ref 0.0–0.2)

## 2020-11-09 LAB — COMPREHENSIVE METABOLIC PANEL
ALT: 64 U/L — ABNORMAL HIGH (ref 0–44)
AST: 24 U/L (ref 15–41)
Albumin: 2.7 g/dL — ABNORMAL LOW (ref 3.5–5.0)
Alkaline Phosphatase: 82 U/L (ref 38–126)
Anion gap: 10 (ref 5–15)
BUN: 10 mg/dL (ref 6–20)
CO2: 24 mmol/L (ref 22–32)
Calcium: 8.6 mg/dL — ABNORMAL LOW (ref 8.9–10.3)
Chloride: 101 mmol/L (ref 98–111)
Creatinine, Ser: 0.6 mg/dL — ABNORMAL LOW (ref 0.61–1.24)
GFR, Estimated: >60 mL/min (ref >60)
Glucose, Bld: 132 mg/dL — ABNORMAL HIGH (ref 70–99)
Potassium: 3.5 mmol/L (ref 3.5–5.1)
Sodium: 135 mmol/L (ref 135–145)
Total Bilirubin: 1.3 mg/dL — ABNORMAL HIGH (ref 0.3–1.2)
Total Protein: 5.7 g/dL — ABNORMAL LOW (ref 6.5–8.1)

## 2020-11-09 LAB — TSH: TSH: 0.824 u[IU]/mL (ref 0.350–4.500)

## 2020-11-09 NOTE — Progress Notes (Signed)
Patient ID: Sean [name], male   DOB: 12-Mar-1966, 55 y.o.   MRN: [id number]  SW received updates from [place] that branch unable to accept referral. SW still waiting on follow-up from [place].  *referral declined by [facility].   Per [employer]- DME delivered to home yesterday, however TTB was declined by wife as she indicated they had one already.   SW sent referral to [facility] and waiting on follow-up.  *branch declined.  SW sent referral to [place] and waiting on follow-up.   Declined HHAs: [facility]- branch declined due to OON benefits [facility]- declined [facility]- branch declined [facility], MSW, [place] Office: [phone] Cell: [phone] Fax: [phone]

## 2020-11-09 NOTE — Progress Notes (Signed)
Inpatient Rehabilitation Care Coordinator Discharge Note  The overall goal for the admission was met for:   Discharge location: Yes. D/c to home with 24/7 care. Sent home with HEP due to challenges with obtaining HH.  Length of Stay: Yes. 13 days.   Discharge activity level: Yes. Supervision  Home/community participation: Yes. Limited.   Services provided included: MD, RD, PT, OT, SLP, RN, CM, TR, Pharmacy, Neuropsych and SW  Financial Services: Private Insurance: [employer]  Choices offered to/list presented to:Yes  Follow-up services arranged: Home Health: Unable to establish [facility] due to insurance and/or staffing. SW will follow-up if able to obtain and DME: [facility] for 3in1 BSC and TTB- delivered to the home.  TTB declined at delivery as family reported they have already.  Comments (or additional information):  Patient/Family verbalized understanding of follow-up arrangements: Yes  Individual responsible for coordination of the follow-up plan: contact pt sister [name] [phone]  Confirmed correct DME delivered: Rana Snare 11/09/2020    Rana Snare

## 2020-11-09 NOTE — Progress Notes (Signed)
°  Tele sitter removed from pt room. Family at bedside. Sheela Stack, LPN   9611: pt tolerating well. Pt calm and cooperative.

## 2020-11-09 NOTE — Progress Notes (Signed)
PA Dan in with pt/family to discuss discharge instructions. Questions answered. Belongings gathered. Pt left per private vehicle. No complications noted. Sheela Stack, LPN

## 2020-11-10 ENCOUNTER — Ambulatory Visit: Payer: Self-pay | Admitting: Radiation Oncology

## 2020-11-10 ENCOUNTER — Inpatient Hospital Stay: Payer: 59 | Attending: Oncology | Admitting: Oncology

## 2020-11-10 ENCOUNTER — Encounter: Payer: Self-pay | Admitting: General Practice

## 2020-11-10 ENCOUNTER — Other Ambulatory Visit: Payer: Self-pay

## 2020-11-10 ENCOUNTER — Inpatient Hospital Stay: Payer: 59

## 2020-11-10 ENCOUNTER — Encounter: Payer: Self-pay | Admitting: *Deleted

## 2020-11-10 ENCOUNTER — Other Ambulatory Visit: Payer: Self-pay | Admitting: Oncology

## 2020-11-10 ENCOUNTER — Telehealth: Payer: Self-pay

## 2020-11-10 VITALS — BP 107/67 | HR 73 | Temp 97.5°F | Resp 16 | Wt 184.0 lb

## 2020-11-10 DIAGNOSIS — C7931 Secondary malignant neoplasm of brain: Secondary | ICD-10-CM | POA: Diagnosis present

## 2020-11-10 DIAGNOSIS — C439 Malignant melanoma of skin, unspecified: Secondary | ICD-10-CM | POA: Insufficient documentation

## 2020-11-10 DIAGNOSIS — Z5112 Encounter for antineoplastic immunotherapy: Secondary | ICD-10-CM | POA: Insufficient documentation

## 2020-11-10 MED ORDER — SODIUM CHLORIDE 0.9 % IV SOLN
Freq: Once | INTRAVENOUS | Status: AC
  Filled 2020-11-10: qty 250

## 2020-11-10 MED ORDER — SODIUM CHLORIDE 0.9 % IV SOLN
240.0000 mg | Freq: Once | INTRAVENOUS | Status: AC
  Administered 2020-11-10: 240 mg via INTRAVENOUS
  Filled 2020-11-10: qty 24

## 2020-11-10 NOTE — Progress Notes (Signed)
Faxed referral order for hospital bed, demographics,  and last office note to [place] [phone]

## 2020-11-10 NOTE — Progress Notes (Signed)
**Sean [name] De-Identified via Obfuscation** Sean Sean [name]   Diagnosis: Metastatic melanoma  INTERVAL HISTORY:   Sean Sean [name] was discharged from the hospital yesterday.  He is here today with his sister.  He has no complaint.  His sister reports it was difficult to get Sean Sean [name] into the car today.  He required assistance from his brothers to get from the sofa to bed last night.  He remains confused.  Objective:  Vital signs in last 24 hours:  There were no vitals taken for this visit.    HEENT: No thrush Resp: Lungs clear bilaterally Cardio: Regular rate and rhythm GI: No hepatomegaly Vascular: No leg edema Neuro: Alert, follows some commands, left-sided neglect, not oriented to place, we did not test ambulation today Skin: Faint erythematous rash at the right mid abdomen   Lab Results:  Lab Results  Component Value Date   WBC 12.7 (H) 11/09/2020   HGB 13.6 11/09/2020   HCT 37.6 (L) 11/09/2020   MCV 88.9 11/09/2020   PLT 169 11/09/2020   NEUTROABS 9.6 (H) 11/09/2020    CMP  Lab Results  Component Value Date   NA 135 11/09/2020   K 3.5 11/09/2020   CL 101 11/09/2020   CO2 24 11/09/2020   GLUCOSE 132 (H) 11/09/2020   BUN 10 11/09/2020   CREATININE 0.60 (L) 11/09/2020   CALCIUM 8.6 (L) 11/09/2020   PROT 5.7 (L) 11/09/2020   ALBUMIN 2.7 (L) 11/09/2020   AST 24 11/09/2020   ALT 64 (H) 11/09/2020   ALKPHOS 82 11/09/2020   BILITOT 1.3 (H) 11/09/2020   GFRNONAA >60 11/09/2020     Medications: I have reviewed the patient's current medications.   Assessment/Plan: 1. Multiple brain metastases, unknown primary tumor site  CT brain 09/14/2020-multiple hemorrhagic metastases with surrounding edema in the bilateral cerebral hemispheres  CTs chest, abdomen, pelvis 09/14/2020-1.4 cm right upper lobe subpleural nodule, 0.5 cm right lower lobe nodule, multiple hypodensities in the liver suspicious for metastases, 1.5 cm right upper pole hypodense kidney  lesion-indeterminate  MRI abdomen 09/15/2020-multifocal T1 hyperintense liver lesions, no kidney mass  Ultrasound-guided biopsy of a segment 4B liver lesion 09/18/2020-benign liver parenchyma with steatosis, no evidence of malignancy  PET 09/25/2020-focal area of hypermetabolism in the right hilum, multiple areas of the liver, pelvic musculature, and left thigh-no CT correlate with any of these areas on the noncontrast CT, 10 mm anterior right upper lobe nodule with low-level hypermetabolism, additional tiny pulmonary nodules too small to characterize by PET  SRS to 10 brain lesions 09/27/2020  Resection of left occipital and left frontal lesions on 09/29/2020-pathology consistent with malignant melanoma  Cycle 1 ipilimumab/nivolumab 10/16/2020  CT of the brain without contrast 10/26/2020 showed significant interval increase in the number and size of numerous metastases  Brain MRI 10/24/2020-multiple new and increased brain metastases, moderate edema surrounding multiple lesions, new mass-effect at the right lateral ventricle  Brain MRI 11/01/2020-stable appearance of his multiple intracranial metastases and associated edema and mild mass-effect.  CT of the chest with contrast 11/01/2020-interval progression of bilateral pulmonary nodules concerning for metastatic disease, small right hilar lymph nodes, new and progressive ill-defined lesions in the liver compatible with progression of metastatic involvement.  Cycle 2 nivolumab 11/10/2020 2.Left-sided weakness, new onset seizure secondary to #1, weakness has resolved 3.Hyperlipidemia 4.Elevated liver enzymes and bilirubin  Liver enzymes more elevated on repeat labs 09/27/2020, improved1/13/2022 5.Headache and nausea/vomiting secondary to #1, resolved 6.  Hospital admission 10/20/2020-encephalopathy, slow Decadron taper-down to 2 mg at discharge  11/09/2020    Disposition: Sean Sean [name] has metastatic malignant melanoma.  He was admitted  after cycle 1 ipilimumab/nivolumab with altered mental status and left-sided weakness.  There was evidence of enlarging, hemorrhagic, and new brain metastases on a repeat brain MRI.  A CT of the chest revealed evidence of progressive disease in the lungs and liver.  I discussed treatment options with Sean Sean [name] family while he was in the hospital.  They understand the brain findings may have been related to radiation toxicity, disease progression, or a flare from immunotherapy.  Systemic treatment options are limited since the tumor is BRAF wild-type.  I recommend proceeding with single agent nivolumab.  They understand it is possible nivolumab could result in worsening of the CNS disease.  However, his survival likely depends on a response to immunotherapy.  On The plan is to proceed with every 2-week single agent nivolumab.  He will continue Decadron at a dose of 2 mg daily.  The rash over the abdominal wall is most likely not related to immunotherapy as this rash was not present until today.  We will request a home hospital bed as Sean Sean [name] has significant cognitive and neurologic deficits which make it difficult for him to position himself and to get out of bed with assistance.  He will be scheduled for an office visit in the next treatment with nivolumab in 2 weeks.  His family will contact us in the interim as needed.  Betsy Coder, MD  11/10/2020  1:56 PM

## 2020-11-10 NOTE — Patient Instructions (Signed)
Nivolumab injection What is this medicine? NIVOLUMAB (nye VOL ue mab) is a monoclonal antibody. It treats certain types of cancer. Some of the cancers treated are colon cancer, head and neck cancer, Hodgkin lymphoma, lung cancer, and melanoma. This medicine may be used for other purposes; ask your health care provider or pharmacist if you have questions. COMMON BRAND NAME(S): Opdivo What should I tell my health care provider before I take this medicine? They need to know if you have any of these conditions:  autoimmune diseases like Crohn's disease, ulcerative colitis, or lupus  have had or planning to have an allogeneic stem cell transplant (uses someone else's stem cells)  history of chest radiation  history of organ transplant  nervous system problems like myasthenia gravis or Guillain-Barre syndrome  an unusual or allergic reaction to nivolumab, other medicines, foods, dyes, or preservatives  pregnant or trying to get pregnant  breast-feeding How should I use this medicine? This medicine is for infusion into a vein. It is given by a health care professional in a hospital or clinic setting. A special MedGuide will be given to you before each treatment. Be sure to read this information carefully each time. Talk to your pediatrician regarding the use of this medicine in children. While this drug may be prescribed for children as young as 12 years for selected conditions, precautions do apply. Overdosage: If you think you have taken too much of this medicine contact a poison control center or emergency room at once. NOTE: This medicine is only for you. Do not share this medicine with others. What if I miss a dose? It is important not to miss your dose. Call your doctor or health care professional if you are unable to keep an appointment. What may interact with this medicine? Interactions have not been studied. This list may not describe all possible interactions. Give your health  care provider a list of all the medicines, herbs, non-prescription drugs, or dietary supplements you use. Also tell them if you smoke, drink alcohol, or use illegal drugs. Some items may interact with your medicine. What should I watch for while using this medicine? This drug may make you feel generally unwell. Continue your course of treatment even though you feel ill unless your doctor tells you to stop. You may need blood work done while you are taking this medicine. Do not become pregnant while taking this medicine or for 5 months after stopping it. Women should inform their doctor if they wish to become pregnant or think they might be pregnant. There is a potential for serious side effects to an unborn child. Talk to your health care professional or pharmacist for more information. Do not breast-feed an infant while taking this medicine or for 5 months after stopping it. What side effects may I notice from receiving this medicine? Side effects that you should report to your doctor or health care professional as soon as possible:  allergic reactions like skin rash, itching or hives, swelling of the face, lips, or tongue  breathing problems  blood in the urine  bloody or watery diarrhea or black, tarry stools  changes in emotions or moods  changes in vision  chest pain  cough  dizziness  feeling faint or lightheaded, falls  fever, chills  headache with fever, neck stiffness, confusion, loss of memory, sensitivity to light, hallucination, loss of contact with reality, or seizures  joint pain  mouth sores  redness, blistering, peeling or loosening of the skin, including inside the   mouth  severe muscle pain or weakness  signs and symptoms of high blood sugar such as dizziness; dry mouth; dry skin; fruity breath; nausea; stomach pain; increased hunger or thirst; increased urination  signs and symptoms of kidney injury like trouble passing urine or change in the amount of  urine  signs and symptoms of liver injury like dark yellow or brown urine; general ill feeling or flu-like symptoms; light-colored stools; loss of appetite; nausea; right upper belly pain; unusually weak or tired; yellowing of the eyes or skin  swelling of the ankles, feet, hands  trouble passing urine or change in the amount of urine  unusually weak or tired  weight gain or loss Side effects that usually do not require medical attention (report to your doctor or health care professional if they continue or are bothersome):  bone pain  constipation  decreased appetite  diarrhea  muscle pain  nausea, vomiting  tiredness This list may not describe all possible side effects. Call your doctor for medical advice about side effects. You may report side effects to FDA at 1-800-FDA-1088. Where should I keep my medicine? This drug is given in a hospital or clinic and will not be stored at home. NOTE: This sheet is a summary. It may not cover all possible information. If you have questions about this medicine, talk to your doctor, pharmacist, or health care provider.  2021 Elsevier/Gold Standard (2020-01-05 10:08:25)  

## 2020-11-10 NOTE — Telephone Encounter (Signed)
SW sent Sean [name] [facility] referral for HHPT/OT/SLP again. Referral accepted for [facility] [phone]. SW called pt sister Sean [name] [phone]) to provide updates on HHA being [facility], and they will follow-up within 2-3 days to schedule the home visit. SW provided her with contact information. No further SW follow-up required from [facility].

## 2020-11-10 NOTE — Addendum Note (Signed)
Addended by: Tania Ade on: 11/10/2020 02:26 PM   Modules accepted: Orders

## 2020-11-10 NOTE — Progress Notes (Signed)
[facility] Spiritual Care Note  Met with Liliane Channel and his sister [name] from [place] in infusion per referral from [place] for assistance registering for [employer]. Provided AD forms and informational brochure, answered questions, and registered Mr Dorfman for [facility] at 2:30 on 3/14 per request.   Suzette Battiest, [place], [facility] Pager [phone] Voicemail [phone]

## 2020-11-13 ENCOUNTER — Telehealth: Payer: Self-pay | Admitting: Oncology

## 2020-11-13 ENCOUNTER — Other Ambulatory Visit: Payer: Self-pay | Admitting: *Deleted

## 2020-11-13 ENCOUNTER — Telehealth: Payer: Self-pay | Admitting: *Deleted

## 2020-11-13 NOTE — Telephone Encounter (Signed)
Wife call to inquire if the hospital bed order has been placed and if MD is willing to try a home trial of Ritalin. Confirmed w/[facility] that order has been received and is being processed. They will reach out to wife when ready for delivery. Provided Sean [name] the phone # for [facility] to f/u on this if she does not hear something soon. Dr. [name] will call in script later this week. She requests that RN mail her copy of the eye exam report we received and the one from today as well (we were not aware he went again today).

## 2020-11-13 NOTE — Progress Notes (Signed)
MD requests RN order Ritalin 5 mg bid. Unable to order script--MD required to order. Will alert MD to this tomorrow.

## 2020-11-13 NOTE — Telephone Encounter (Signed)
Scheduled appointments per 2/25 los. Spoke to patient's wife who is aware of appointments date and times.

## 2020-11-14 ENCOUNTER — Other Ambulatory Visit: Payer: Self-pay | Admitting: Oncology

## 2020-11-14 MED ORDER — METHYLPHENIDATE HCL 5 MG PO TABS: 5.0000 mg | ORAL_TABLET | Freq: Two times a day (BID) | ORAL | 0 refills | Status: DC

## 2020-11-15 ENCOUNTER — Telehealth: Payer: Self-pay | Admitting: *Deleted

## 2020-11-15 NOTE — Telephone Encounter (Signed)
Left voice mail requesting verbal orders for the following:  PT 1/week x1; 2/week x 1 week; 1/week x 3 weeks CSW referral Speech therapy evaluation [facility] 2/week x 3 weeks, then 1/week x 1 week. Per Dr. [name]: OK for all except speech therapy--does not feel it necessary at this time.

## 2020-11-16 ENCOUNTER — Telehealth: Payer: Self-pay | Admitting: *Deleted

## 2020-11-16 ENCOUNTER — Encounter: Payer: Self-pay | Admitting: General Practice

## 2020-11-16 NOTE — Progress Notes (Signed)
[place] CSW Progress Notes  Call from wife, [name].  She requests help with getting patient assistance at home.  States that since his discharge, he has been at home but cannot be left unattended.  His brothers from [place] are currently staying at the home to assist.  Wife works driving semi trailers on local routes, she is the primary source of income at this point and needs to continue to work  Has had PT evaluation at home from [facility] per wife - PT states he requires 3 people to assist w transfers from bed to wheelchair, cannot ambulate on his own.  Vision has been severely impacted - cannot read, sees "only shapes and colors" per wife.  Is not speaking "much if at all, this has gotten worse since he left the hospital" per wife.  She does not have legal/financial or healthcare power of attorney - they had planned to complete this process, but disease advanced very quickly and they were unable to do so.    HH will be providing SW and PT services based on referral from medical oncologist.    Wife advised that she may need to file for emergency guardianship as patient is unable to participate in decision making in all areas.  She will need to do this via the [facility] [place] office.  Patient can be assisted w filing for Social Security disability through [facility] - wife approves this referral.  SW will submit to [facility] and they will expedite the process.  Edwyna Shell, LCSW Clinical Social Worker Phone:  [phone]

## 2020-11-16 NOTE — Telephone Encounter (Signed)
Wife reporting that when Lamario chews or talks he has tremors/shaking of his left jaw. Also with movement, his left hand shakes as well. This is a recent change. Asking if this is worrisome or normal? Does he need to be seen? This RN also spoke with CSW, Edwyna Shell and she has spoke with wife she needs to file for legal guardianship, since he is not able to make his medical care or financial decisions. Per Dr. [name] : they tremors are not worrisome, especially if it only occurs with movement. Suggested a F/U with Dr. Mickeal Skinner. Wife was very receptive to this and prefers a video visit.  Dr. Mickeal Skinner agrees to 3/7 at 0930--scheduling message sent. Informed wife that Dr. Mickeal Skinner or Dr. [name] is very willing to dictate a letter to help her obtain guardianship. Will also discuss Palliative referral at next visit.

## 2020-11-17 ENCOUNTER — Encounter: Payer: Self-pay | Admitting: *Deleted

## 2020-11-17 NOTE — Progress Notes (Signed)
Received eye exam reports from Dr. Valetta Close for 10/13/20 and 11/13/20. Copies sent to HIM and mailed copies to wife per her request.

## 2020-11-19 ENCOUNTER — Other Ambulatory Visit: Payer: Self-pay | Admitting: Oncology

## 2020-11-20 ENCOUNTER — Inpatient Hospital Stay: Payer: 59 | Attending: Oncology | Admitting: Internal Medicine

## 2020-11-20 DIAGNOSIS — C7931 Secondary malignant neoplasm of brain: Secondary | ICD-10-CM | POA: Insufficient documentation

## 2020-11-20 DIAGNOSIS — C439 Malignant melanoma of skin, unspecified: Secondary | ICD-10-CM | POA: Insufficient documentation

## 2020-11-20 DIAGNOSIS — Z5112 Encounter for antineoplastic immunotherapy: Secondary | ICD-10-CM | POA: Insufficient documentation

## 2020-11-20 NOTE — Progress Notes (Signed)
I connected with Sean [name] on 11/20/20 at  9:30 AM EST by video enabled telemedicine visit and verified that I am speaking with the correct person using two identifiers.  I discussed the limitations, risks, security and privacy concerns of performing an evaluation and management service by telemedicine and the availability of in-person appointments. I also discussed with the patient that there may be a patient responsible charge related to this service. The patient expressed understanding and agreed to proceed.  Other persons participating in the visit and their role in the encounter:  spouse  Patient's location:  Home  Provider's location:  Office  Chief Complaint:  Brain metastasis ([place])  History of Present Ilness: Sean [name] and his family describe modest improvement in his confusion, language dysfunction and attention since stopping the anti-depressant and starting the low dose of daily Ritalin.  Overall, while he continues to be greatly impaired, he is clearly not worsening in recent days/weeks.  Needs help with all ALDs including toileting, some feeding.  Walks very little with a walker, mostly confined to chair.  Continues on decadron 2mg  daily.  Observations: Dense expressive dysphasia and abulia noted.  Both arms lift against gravity spontaneously  Assessment and Plan: Brain metastasis ([place])  Extensively discussed his CNS treatment history, complexity of current picture, and plans for treatment in the future.  Much of this hinges on results of MRI brain scheduled for 11/24/20.  They understand we will meet in tumor board on 11/27/20 and formulate a treatment plan.  In the meantime, he is ok to continue decadron 2mg  daily, and follow up with Dr. [name] for immunotherapy (nivolumab)  Follow Up Instructions:  He and his family will present to clinic in person next Monday, before or after his infusion/med onc visit.   I discussed the assessment and treatment plan with the patient.  The  patient was provided an opportunity to ask questions and all were answered.  The patient agreed with the plan and demonstrated understanding of the instructions.    The patient was advised to call back or seek an in-person evaluation if the symptoms worsen or if the condition fails to improve as anticipated.  I provided 5-10 minutes of non-face-to-face time during this enocunter.  Ventura Sellers, MD   I provided 30 minutes of face-to-face video visit time during this encounter, and > 50% was spent counseling as documented under my assessment & plan.

## 2020-11-21 ENCOUNTER — Telehealth: Payer: Self-pay | Admitting: *Deleted

## 2020-11-21 NOTE — Telephone Encounter (Signed)
Called to request verbal order for speech/language evaluation and treat. Significant cognitive communication issues and swallowing. OK per Dr. [name].

## 2020-11-22 ENCOUNTER — Other Ambulatory Visit: Payer: Self-pay | Admitting: *Deleted

## 2020-11-22 ENCOUNTER — Encounter: Payer: Self-pay | Admitting: *Deleted

## 2020-11-22 DIAGNOSIS — C439 Malignant melanoma of skin, unspecified: Secondary | ICD-10-CM

## 2020-11-22 DIAGNOSIS — C7931 Secondary malignant neoplasm of brain: Secondary | ICD-10-CM

## 2020-11-22 NOTE — Progress Notes (Signed)
[place] Clinical Social Work  [employer] representative received requested letter from physician and forwarded to [employer].  This will allow [place] to move forward with assisting patient in applying for [place] contacted patients wife with update.  Patients wife stated she has been in contact with [facility] and has paperwork from the magistrate.  She plans to pick up signed letter from [facility], complete paperwork and submit this week.     Johnnye Lana, MSW, LCSW, OSW-C Clinical Social Worker [facility] [phone]

## 2020-11-23 ENCOUNTER — Telehealth: Payer: Self-pay | Admitting: *Deleted

## 2020-11-23 ENCOUNTER — Encounter: Payer: Self-pay | Admitting: General Practice

## 2020-11-23 NOTE — Telephone Encounter (Signed)
Physical therapist called to request MD order patient a hoyer lift and pads and a rollator with seat and brakes. Will have MD sign order tomorrow after his office visit to meet face-to-face requirements. Was also informed by CSW that he needs a handicap placard.

## 2020-11-23 NOTE — Progress Notes (Signed)
[place] CSW Progress Notes  Called wife, informed her that letters are available for her to pick up in order to facilitate wife obtaining emergency guardianship.  Alvis Lemmings has already provided [facility] PT, SW, aide.  They will be also sending a speech therapy.  HH PT is currently evaluating him at home and will advise of any additional DME needed.  They have gotten a bed, may need a Hoyer lift.  Wife would also like MD to provide handicap placard.    Edwyna Shell, LCSW Clinical Social Worker Phone:  [phone]

## 2020-11-24 ENCOUNTER — Ambulatory Visit (HOSPITAL_COMMUNITY)
Admission: RE | Admit: 2020-11-24 | Discharge: 2020-11-24 | Disposition: A | Discharge status: Home / Self Care | Payer: 59 | Source: Ambulatory Visit | Attending: Radiation Oncology | Admitting: Radiation Oncology

## 2020-11-24 ENCOUNTER — Inpatient Hospital Stay: Payer: 59

## 2020-11-24 ENCOUNTER — Telehealth: Payer: Self-pay | Admitting: *Deleted

## 2020-11-24 ENCOUNTER — Other Ambulatory Visit: Payer: Self-pay

## 2020-11-24 ENCOUNTER — Inpatient Hospital Stay: Payer: 59 | Admitting: Oncology

## 2020-11-24 DIAGNOSIS — C7931 Secondary malignant neoplasm of brain: Secondary | ICD-10-CM

## 2020-11-24 NOTE — Telephone Encounter (Signed)
Called wife to f/u on his appointment today. She was not aware of this appointment. Her records have him coming on 3/14. Will not be able to come in today--having MRI this afternoon. She is requesting handicap parking placard--will have MD sign form and will be at front desk for pickup today. Faxed orders to [facility] for hoyer lift and rollator with seat and brakes [phone]).

## 2020-11-27 ENCOUNTER — Encounter: Payer: Self-pay | Admitting: Oncology

## 2020-11-27 ENCOUNTER — Ambulatory Visit: Payer: 59

## 2020-11-27 ENCOUNTER — Inpatient Hospital Stay: Payer: 59

## 2020-11-27 ENCOUNTER — Inpatient Hospital Stay (HOSPITAL_BASED_OUTPATIENT_CLINIC_OR_DEPARTMENT_OTHER): Payer: 59 | Admitting: Oncology

## 2020-11-27 ENCOUNTER — Other Ambulatory Visit: Payer: 59

## 2020-11-27 ENCOUNTER — Inpatient Hospital Stay: Payer: 59 | Admitting: *Deleted

## 2020-11-27 ENCOUNTER — Other Ambulatory Visit: Payer: Self-pay

## 2020-11-27 ENCOUNTER — Ambulatory Visit: Payer: 59 | Admitting: Nurse Practitioner

## 2020-11-27 ENCOUNTER — Other Ambulatory Visit: Payer: Self-pay | Admitting: Oncology

## 2020-11-27 VITALS — BP 120/81 | HR 91 | Temp 98.2°F | Resp 20 | Ht 71.0 in

## 2020-11-27 DIAGNOSIS — C7931 Secondary malignant neoplasm of brain: Secondary | ICD-10-CM

## 2020-11-27 DIAGNOSIS — C439 Malignant melanoma of skin, unspecified: Secondary | ICD-10-CM | POA: Diagnosis present

## 2020-11-27 DIAGNOSIS — Z5112 Encounter for antineoplastic immunotherapy: Secondary | ICD-10-CM | POA: Diagnosis not present

## 2020-11-27 LAB — CMP (CANCER CENTER ONLY)
ALT: 131 U/L — ABNORMAL HIGH (ref 0–44)
AST: 34 U/L (ref 15–41)
Albumin: 3.7 g/dL (ref 3.5–5.0)
Alkaline Phosphatase: 102 U/L (ref 38–126)
Anion gap: 9 (ref 5–15)
BUN: 18 mg/dL (ref 6–20)
CO2: 27 mmol/L (ref 22–32)
Calcium: 9.5 mg/dL (ref 8.9–10.3)
Chloride: 105 mmol/L (ref 98–111)
Creatinine: 0.67 mg/dL (ref 0.61–1.24)
GFR, Estimated: >60 mL/min (ref >60)
Glucose, Bld: 111 mg/dL — ABNORMAL HIGH (ref 70–99)
Potassium: 3.8 mmol/L (ref 3.5–5.1)
Sodium: 141 mmol/L (ref 135–145)
Total Bilirubin: 0.7 mg/dL (ref 0.3–1.2)
Total Protein: 7.1 g/dL (ref 6.5–8.1)

## 2020-11-27 LAB — CBC WITH DIFFERENTIAL (CANCER CENTER ONLY)
Abs Immature Granulocytes: 0.26 10*3/uL — ABNORMAL HIGH (ref 0.00–0.07)
Basophils Absolute: 0.1 10*3/uL (ref 0.0–0.1)
Basophils Relative: 1 %
Eosinophils Absolute: 0 10*3/uL (ref 0.0–0.5)
Eosinophils Relative: 0 %
HCT: 43.7 % (ref 39.0–52.0)
Hemoglobin: 14.7 g/dL (ref 13.0–17.0)
Immature Granulocytes: 2 %
Lymphocytes Relative: 10 %
Lymphs Abs: 1.2 10*3/uL (ref 0.7–4.0)
MCH: 30.9 pg (ref 26.0–34.0)
MCHC: 33.6 g/dL (ref 30.0–36.0)
MCV: 92 fL (ref 80.0–100.0)
Monocytes Absolute: 0.6 10*3/uL (ref 0.1–1.0)
Monocytes Relative: 5 %
Neutro Abs: 9.3 10*3/uL — ABNORMAL HIGH (ref 1.7–7.7)
Neutrophils Relative %: 82 %
Platelet Count: 321 10*3/uL (ref 150–400)
RBC: 4.75 MIL/uL (ref 4.22–5.81)
RDW: 14.2 % (ref 11.5–15.5)
WBC Count: 11.5 10*3/uL — ABNORMAL HIGH (ref 4.0–10.5)
nRBC: 0 % (ref 0.0–0.2)

## 2020-11-27 MED ORDER — SODIUM CHLORIDE 0.9 % IV SOLN
240.0000 mg | Freq: Once | INTRAVENOUS | Status: AC
  Administered 2020-11-27: 240 mg via INTRAVENOUS
  Filled 2020-11-27: qty 24

## 2020-11-27 MED ORDER — HYDROCODONE-ACETAMINOPHEN 5-325 MG PO TABS: 1.0000 | ORAL_TABLET | Freq: Four times a day (QID) | ORAL | 0 refills | Status: DC | PRN

## 2020-11-27 MED ORDER — SODIUM CHLORIDE 0.9 % IV SOLN
Freq: Once | INTRAVENOUS | Status: AC
  Filled 2020-11-27: qty 250

## 2020-11-27 NOTE — Patient Instructions (Signed)
[facility] Discharge Instructions for Patients Receiving Chemotherapy  Today you received the following chemotherapy agents Opdivo  To help prevent nausea and vomiting after your treatment, we encourage you to take your nausea medication as directed   If you develop nausea and vomiting that is not controlled by your nausea medication, call the clinic.   BELOW ARE SYMPTOMS THAT SHOULD BE REPORTED IMMEDIATELY:  *FEVER GREATER THAN 100.5 F  *CHILLS WITH OR WITHOUT FEVER  NAUSEA AND VOMITING THAT IS NOT CONTROLLED WITH YOUR NAUSEA MEDICATION  *UNUSUAL SHORTNESS OF BREATH  *UNUSUAL BRUISING OR BLEEDING  TENDERNESS IN MOUTH AND THROAT WITH OR WITHOUT PRESENCE OF ULCERS  *URINARY PROBLEMS  *BOWEL PROBLEMS  UNUSUAL RASH Items with * indicate a potential emergency and should be followed up as soon as possible.  Feel free to call the clinic should you have any questions or concerns. The clinic phone number is [phone].  Please show the CHEMO ALERT CARD at check-in to the Emergency Department and triage nurse.   

## 2020-11-27 NOTE — Progress Notes (Signed)
Met with patient and accompanying adult at registration to introduce myself as Financial Resource Specialist and to offer available resources.  Discussed one-time $1000 [employer] grant and qualifications to assist with personal expenses while going through treatment.  Gave him my card if interested in applying and for any additional financial questions or concerns. 

## 2020-11-27 NOTE — Progress Notes (Signed)
[place] OFFICE PROGRESS NOTE   Diagnosis: Metastatic melanoma  INTERVAL HISTORY:   Sean [name] returns as scheduled.  He completed a cycle of nivolumab on 11/10/2020.  He is here today with his brother.  His wife is present for today's visit by telephone.  The family reports Sean [name] no is talking very little.  He continues to eat and drink.  He is ambulatory with assistance only.  He is wearing a diaper.  He stays in bed or in the chair most of the time.  No seizures.  He takes Tylenol for headaches.  He does not verbalize wishes or complaints. No diarrhea or rash Objective:  Vital signs in last 24 hours:  Blood pressure 120/81, pulse 91, temperature 98.2 F (36.8 C), temperature source Tympanic, resp. rate 20, height 5\' 11"  (1.803 m), SpO2 99 %.    HEENT: No thrush Resp: Lungs clear, decreased inspiratory effort Cardio: Regular rate and rhythm GI: No hepatosplenomegaly Vascular: No leg edema Neuro: Alert, says a few words, is not speaking in sentences.  Follows some simple commands.  Appears to move all extremities. Skin: Dry erythematous plaque like rash at the left back with excoriations    Lab Results:  Lab Results  Component Value Date   WBC 11.5 (H) 11/27/2020   HGB 14.7 11/27/2020   HCT 43.7 11/27/2020   MCV 92.0 11/27/2020   PLT 321 11/27/2020   NEUTROABS 9.3 (H) 11/27/2020    CMP  Lab Results  Component Value Date   NA 141 11/27/2020   K 3.8 11/27/2020   CL 105 11/27/2020   CO2 27 11/27/2020   GLUCOSE 111 (H) 11/27/2020   BUN 18 11/27/2020   CREATININE 0.67 11/27/2020   CALCIUM 9.5 11/27/2020   PROT 7.1 11/27/2020   ALBUMIN 3.7 11/27/2020   AST 34 11/27/2020   ALT 131 (H) 11/27/2020   ALKPHOS 102 11/27/2020   BILITOT 0.7 11/27/2020   GFRNONAA >60 11/27/2020    Lab Results  Component Value Date   CEA1 0.6 09/15/2020     Medications: I have reviewed the patient's current medications.   Assessment/Plan: 1.  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, unknown primary tumor site  CT brain 09/14/2020-multiple hemorrhagic metastases with surrounding edema in the bilateral cerebral hemispheres  CTs chest, abdomen, pelvis 09/14/2020-1.4 cm right upper lobe subpleural nodule, 0.5 cm right lower lobe nodule, multiple hypodensities in the liver suspicious for metastases, 1.5 cm right upper pole hypodense kidney lesion-indeterminate  MRI abdomen 09/15/2020-multifocal T1 hyperintense liver lesions, no kidney mass  Ultrasound-guided biopsy of a segment 4B liver lesion 09/18/2020-benign liver parenchyma with steatosis, no evidence of malignancy  PET 09/25/2020-focal area of hypermetabolism in the right hilum, multiple areas of the liver, pelvic musculature, and left thigh-no CT correlate with any of these areas on the noncontrast CT, 10 mm anterior right upper lobe nodule with low-level hypermetabolism, additional tiny pulmonary nodules too small to characterize by PET  SRS to 10 brain lesions 09/27/2020  Resection of left occipital and left frontal lesions on 09/29/2020-pathology consistent with malignant melanoma  Cycle 1 ipilimumab/nivolumab 10/16/2020  CT of the brain without contrast 10/26/2020 showed significant interval increase in the number and size of numerous metastases  Brain MRI 10/24/2020-multiple new and increased brain metastases, moderate edema surrounding multiple lesions, new mass-effect at the right lateral ventricle  Brain MRI 11/01/2020-stable appearance of his multiple intracranial metastases and associated edema and mild mass-effect.  CT of the chest with contrast  11/01/2020-interval progression of bilateral pulmonary nodules concerning for metastatic disease, small right hilar lymph nodes, new and progressive ill-defined lesions in the liver compatible with progression of metastatic involvement.  Cycle 2 nivolumab 11/10/2020  Cycle 3 nivolumab 11/27/2020 2.Left-sided  weakness, new onset seizure secondary to #1, weakness has resolved 3.Hyperlipidemia 4.Elevated liver enzymes and bilirubin  Liver enzymes more elevated on repeat labs 09/27/2020, improved1/13/2022 5.Headache and nausea/vomiting secondary to #1, resolved 6. Hospital admission 10/20/2020-encephalopathy, slow Decadron taper-down to 2 mg at discharge 11/09/2020      Disposition: [name] has metastatic melanoma.  He is symptomatic with multiple neurologic deficits.  He remains withdrawn and confused.  He is dependent on his family for all of his care.  He has completed 2 total treatments with nivolumab.  The plan is to complete another cycle today.  He appears to be tolerating the nivolumab well.  He will return for an office visit in nivolumab in 2 weeks.  I will consult with Dr. Mickeal Skinner regarding the indication for rescheduling the brain MRI and taper of the Decadron.  Betsy Coder, MD  11/27/2020  12:20 PM

## 2020-11-28 ENCOUNTER — Ambulatory Visit: Payer: 59

## 2020-11-28 ENCOUNTER — Ambulatory Visit: Payer: 59 | Admitting: Radiation Oncology

## 2020-11-28 ENCOUNTER — Emergency Department (HOSPITAL_COMMUNITY)
Admission: EM | Admit: 2020-11-28 | Discharge: 2020-12-01 | Disposition: A | Discharge status: Home / Self Care | Payer: 59 | Attending: Emergency Medicine | Admitting: Emergency Medicine

## 2020-11-28 ENCOUNTER — Other Ambulatory Visit: Payer: Self-pay

## 2020-11-28 ENCOUNTER — Telehealth: Payer: Self-pay

## 2020-11-28 ENCOUNTER — Telehealth: Payer: Self-pay | Admitting: Oncology

## 2020-11-28 ENCOUNTER — Encounter (HOSPITAL_COMMUNITY): Payer: Self-pay

## 2020-11-28 DIAGNOSIS — R000 Tachycardia, unspecified: Secondary | ICD-10-CM | POA: Insufficient documentation

## 2020-11-28 DIAGNOSIS — C7931 Secondary malignant neoplasm of brain: Secondary | ICD-10-CM | POA: Insufficient documentation

## 2020-11-28 DIAGNOSIS — Z87891 Personal history of nicotine dependence: Secondary | ICD-10-CM | POA: Diagnosis not present

## 2020-11-28 DIAGNOSIS — C439 Malignant melanoma of skin, unspecified: Secondary | ICD-10-CM | POA: Insufficient documentation

## 2020-11-28 DIAGNOSIS — Z20822 Contact with and (suspected) exposure to covid-19: Secondary | ICD-10-CM | POA: Diagnosis not present

## 2020-11-28 DIAGNOSIS — C799 Secondary malignant neoplasm of unspecified site: Secondary | ICD-10-CM

## 2020-11-28 HISTORY — DX: Malignant (primary) neoplasm, unspecified: C80.1

## 2020-11-28 MED ORDER — DEXAMETHASONE 2 MG PO TABS
2.0000 mg | ORAL_TABLET | Freq: Every day | ORAL | Status: DC
  Administered 2020-11-29 – 2020-12-01 (×3): 2 mg via ORAL
  Filled 2020-11-28 (×4): qty 1

## 2020-11-28 MED ORDER — METHYLPHENIDATE HCL 5 MG PO TABS
5.0000 mg | ORAL_TABLET | Freq: Two times a day (BID) | ORAL | Status: DC
Start: 2020-11-28 — End: 2020-12-01
  Administered 2020-11-29 – 2020-12-01 (×4): 5 mg via ORAL
  Filled 2020-11-28 (×4): qty 1

## 2020-11-28 MED ORDER — ACETAMINOPHEN 325 MG PO TABS
650.0000 mg | ORAL_TABLET | Freq: Four times a day (QID) | ORAL | Status: DC | PRN
  Administered 2020-11-28: 650 mg via ORAL
  Filled 2020-11-28: qty 2

## 2020-11-28 MED ORDER — HYDROCODONE-ACETAMINOPHEN 5-325 MG PO TABS
1.0000 | ORAL_TABLET | Freq: Four times a day (QID) | ORAL | Status: DC | PRN
  Administered 2020-11-28 – 2020-11-29 (×3): 1 via ORAL
  Filled 2020-11-28 (×3): qty 1

## 2020-11-28 MED ORDER — PANTOPRAZOLE SODIUM 40 MG PO TBEC
40.0000 mg | DELAYED_RELEASE_TABLET | Freq: Every day | ORAL | Status: DC
  Administered 2020-11-28 – 2020-12-01 (×4): 40 mg via ORAL
  Filled 2020-11-28 (×4): qty 1

## 2020-11-28 MED ORDER — LEVETIRACETAM 500 MG PO TABS
500.0000 mg | ORAL_TABLET | Freq: Two times a day (BID) | ORAL | Status: DC
  Administered 2020-11-28 – 2020-12-01 (×6): 500 mg via ORAL
  Filled 2020-11-28: qty 2
  Filled 2020-11-28 (×5): qty 1

## 2020-11-28 MED ORDER — MELATONIN 5 MG PO TABS
5.0000 mg | ORAL_TABLET | Freq: Every evening | ORAL | Status: DC | PRN
  Administered 2020-11-28: 5 mg via ORAL
  Filled 2020-11-28: qty 1

## 2020-11-28 NOTE — ED Provider Notes (Signed)
[facility] DEPT Provider Note   CSN: [id number] Arrival date & time: 11/28/20  1543     History Chief Complaint  Patient presents with  . for long term placement    Sean [name] is a 55 y.o. male.  HPI Patient sent in by Adult Protective Services for skilled nursing placement.  Patient has metastatic melanoma to the brain.  Not functioning well at home.  Really cannot get up and walk around.  Patient's wife lives with him and reportedly cannot really take care of him.  Patient really cannot provide much history.  Patient is here with his brother that lives in [place].  Patient does eventually recognize his brother but thinks his brother lives with him not out of state.    Past Medical History:  Diagnosis Date  . Brain lesion 09/14/2020  . Cancer ([place])   . Hyperlipidemia   . Liver lesion 09/14/2020    Patient Active Problem List   Diagnosis Date Noted  . Malignant melanoma ([place]) 10/26/2020  . Brain metastasis ([place]) 10/20/2020  . Melanoma of skin ([place]) 10/05/2020  . Status post craniotomy 09/29/2020  . Brain tumor ([place]) 09/29/2020  . Elevated blood pressure reading 09/15/2020  . Malignant melanoma metastatic to brain ([place]) 09/15/2020  . Uncomplicated alcohol dependence ([place])   . Seizure ([place])   . Hypokalemia   . Hyponatremia   . Elevated LFTs   . Brain metastases ([place]) 09/14/2020    Past Surgical History:  Procedure Laterality Date  . APPLICATION OF CRANIAL NAVIGATION  09/29/2020   Procedure: APPLICATION OF CRANIAL NAVIGATION;  Surgeon: Judith Part, MD;  Location: [facility];  Service: Neurosurgery;;  . Kyla Balzarine Left 09/29/2020   Procedure: Left craniotomy for tumor resection with brainlab;  Surgeon: Judith Part, MD;  Location: [facility];  Service: Neurosurgery;  Laterality: Left;  . HERNIA REPAIR     left side inguinal hernia about 18-20 years       Family History  Problem Relation Age of Onset  . Osteoarthritis Mother    . Hypertension Father   . Colon polyps Father   . Colon polyps Brother   . Colon polyps Brother   . Colon cancer Neg Hx   . Stomach cancer Neg Hx   . Rectal cancer Neg Hx   . Esophageal cancer Neg Hx   . Liver cancer Neg Hx     Social History   Tobacco Use  . Smoking status: Former Research scientist (life sciences)  . Smokeless tobacco: Never Used  . Tobacco comment: 09/27/20: per patient quit about over 20 years ago   Vaping Use  . Vaping Use: Never used  Substance Use Topics  . Alcohol use: Not Currently  . Drug use: No    Home Medications Prior to Admission medications   Medication Sig Start Date End Date Taking? Authorizing Provider  acetaminophen (TYLENOL) 325 MG tablet Take 2 tablets (650 mg total) by mouth every 6 (six) hours as needed for mild pain (or Fever >/= 101). 11/08/20   Angiulli, Lavon Paganini, PA-C  citalopram (CELEXA) 10 MG tablet Take 1 tablet (10 mg total) by mouth at bedtime. 11/08/20   Angiulli, Lavon Paganini, PA-C  dexamethasone (DECADRON) 2 MG tablet Take 1 tablet (2 mg total) by mouth daily. 11/08/20   Angiulli, Lavon Paganini, PA-C  HYDROcodone-acetaminophen (NORCO/VICODIN) 5-325 MG tablet Take 1 tablet by mouth every 6 (six) hours as needed for moderate pain. 11/27/20   [name], MD  levETIRAcetam (KEPPRA) 500  MG tablet Take 1 tablet (500 mg total) by mouth 2 (two) times daily. 11/08/20   Angiulli, Lavon Paganini, PA-C  melatonin 5 MG TABS Take 1 tablet (5 mg total) by mouth at bedtime as needed. 11/08/20   Angiulli, Lavon Paganini, PA-C  methylphenidate (RITALIN) 5 MG tablet Take 1 tablet (5 mg total) by mouth 2 (two) times daily. 11/14/20   [name], MD  Multiple Vitamin (MULTIVITAMIN WITH MINERALS) TABS tablet Take 1 tablet by mouth daily. Patient taking differently: Take 2 tablets by mouth daily. gummy 09/19/20   Geradine Girt, DO  pantoprazole (PROTONIX) 40 MG tablet Take 1 tablet (40 mg total) by mouth daily. 11/08/20   Angiulli, Lavon Paganini, PA-C  senna-docusate (SENOKOT-S) 8.6-50 MG tablet  Take 2 tablets by mouth at bedtime. 11/08/20   Angiulli, Lavon Paganini, PA-C    Allergies    Patient has no known allergies.  Review of Systems   Review of Systems  Unable to perform ROS: Dementia    Physical Exam Updated Vital Signs BP 129/90 (BP Location: Right Arm)   Pulse (!) 111   Temp 98.1 F (36.7 C) (Oral)   Resp 16   Ht 5\' 11"  (1.803 m)   Wt 79.4 kg   SpO2 98%   BMI 24.41 kg/m   Physical Exam Vitals and nursing note reviewed.  Eyes:     General: No scleral icterus. Cardiovascular:     Comments: Mild tachycardia Pulmonary:     Breath sounds: No wheezing or rhonchi.  Abdominal:     Tenderness: There is no abdominal tenderness.  Musculoskeletal:        General: No tenderness.     Cervical back: Neck supple.  Skin:    General: Skin is warm.  Neurological:     Mental Status: He is alert.     Comments: Sitting in wheelchair.  Reportedly at current baseline.  Able to recognize his brother.  Not able to stand up.  Not able to tell who lives with him.     ED Results / Procedures / Treatments   Labs (all labs ordered are listed, but only abnormal results are displayed) Labs Reviewed  SARS CORONAVIRUS 2 (TAT 6-24 HRS)  URINALYSIS, ROUTINE W REFLEX MICROSCOPIC    EKG None  Radiology No results found.  Procedures Procedures   Medications Ordered in ED Medications  acetaminophen (TYLENOL) tablet 650 mg (650 mg Oral Given 11/28/20 2216)  dexamethasone (DECADRON) tablet 2 mg (2 mg Oral Not Given 11/28/20 1952)  HYDROcodone-acetaminophen (NORCO/VICODIN) 5-325 MG per tablet 1 tablet (1 tablet Oral Given 11/28/20 2215)  levETIRAcetam (KEPPRA) tablet 500 mg (500 mg Oral Given 11/28/20 2216)  melatonin tablet 5 mg (5 mg Oral Given 11/28/20 2215)  methylphenidate (RITALIN) tablet 5 mg ( Oral Canceled Entry 11/28/20 2207)  pantoprazole (PROTONIX) EC tablet 40 mg (40 mg Oral Given 11/28/20 2216)    ED Course  I have reviewed the triage vital signs and the nursing  notes.  Pertinent labs & imaging results that were available during my care of the patient were reviewed by me and considered in my medical decision making (see chart for details).    MDM Rules/Calculators/A&P                          Patient brought in after Adult Protective Services reportedly wanted placement for the patient.  Reportedly not doing well at home.  Reportedly family members cannot take care of  the patient cannot take care of himself.  Patient is medically cleared.  Reviewed recent blood work.  Urinalysis still pending.  Covid test also pending.  Physical therapy will evaluate for needs.  Transitional care is following Final Clinical Impression(s) / ED Diagnoses Final diagnoses:  Metastatic malignant neoplasm, unspecified site [facility])    Rx / [place] Orders ED Discharge Orders    None       Davonna Belling, MD 11/28/20 2240

## 2020-11-28 NOTE — Telephone Encounter (Signed)
Scheduled appt per 3/14 los - left message for patient with appt date and time

## 2020-11-28 NOTE — Progress Notes (Signed)
11/28/2020  1845  Tried I/O cath, meet resistance, stopped. Place condom cathter on patient in order to obtain urine sample. Pt scrotum beet red. Applied barrier cream to scrotum.

## 2020-11-28 NOTE — ED Triage Notes (Signed)
Patient was sent by APS. Patient lives with his wife. Patient's wife is unable to take care of the patient.  Patient's brother states that the patient can not stand, ambulate, feed self, or go to the bathroom on his on. The patient's brother states that the patient's wife is alcoholic and has not been giving the patient his meds.
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
## 2020-11-28 NOTE — ED Notes (Signed)
Per Park Liter at [facility], states he sent them to ED for evaluation for placement

## 2020-11-29 ENCOUNTER — Encounter: Payer: Self-pay | Admitting: *Deleted

## 2020-11-29 DIAGNOSIS — C7931 Secondary malignant neoplasm of brain: Secondary | ICD-10-CM

## 2020-11-29 DIAGNOSIS — C439 Malignant melanoma of skin, unspecified: Secondary | ICD-10-CM

## 2020-11-29 LAB — URINALYSIS, ROUTINE W REFLEX MICROSCOPIC
Bacteria, UA: NONE SEEN
Bilirubin Urine: NEGATIVE
Glucose, UA: NEGATIVE mg/dL
Ketones, ur: NEGATIVE mg/dL
Leukocytes,Ua: NEGATIVE
Nitrite: NEGATIVE
Protein, ur: NEGATIVE mg/dL
Specific Gravity, Urine: 1.028 (ref 1.005–1.030)
pH: 5 (ref 5.0–8.0)

## 2020-11-29 LAB — SARS CORONAVIRUS 2 (TAT 6-24 HRS): SARS Coronavirus 2: NEGATIVE

## 2020-11-29 NOTE — Progress Notes (Signed)
TOC CM/CSW received a call from [facility].  Park Liter proved CSW with update on pt and circumstances.  CSW will continue to follow for dc needs.  Ricquita Tarpley-Carter, MSW, LCSW-A Pronouns:  She, Her, Hers                  [facility] ED Transitions of CareClinical Social Worker [email or web address] [phone]

## 2020-11-29 NOTE — ED Notes (Signed)
Patient is resting comfortably. 

## 2020-11-29 NOTE — Progress Notes (Signed)
TOC CM/CSW spoke with [facility] [address] [facility] [phone].  [facility] has accepted pt and has started [place].  CSW will continue to follow for dc needs.  Ricquita Tarpley-Carter, MSW, LCSW-A Pronouns:  She, Her, [place] ED Transitions of CareClinical Social Worker [email or web address] [phone]

## 2020-11-29 NOTE — Evaluation (Signed)
Physical Therapy Evaluation Patient Details Name: Sean [name] MRN: [id number] DOB: 10-26-1965 Today's Date: 11/29/2020   History of Present Illness  Pt is a 55 y.o. male who presents   by Adult Protective Services for skilled nursing placement.  Patient has metastatic melanoma to the brain. Patient's wife lives with him and reportedly cannot really take care of him , brother has been assisting. Pt has hx of multiple brain mets and craniotomy with tumor resection on the L 09/29/2020. [place] [facility]  Clinical Impression  Patient is alert, stated his name and his brother's name, no other verbalizations. Patient slow to follow directions inconsistently, requiring multimodal cues. Patient requires  +2 max assistance for bed mobility. Stood x 2 with STEDY lift equipment requiring max/total assistance to rise. Patient was able to hold onto front bar of STEDY, did not really "Pull up" as therapists used pad to lift buttocks to stand. Patient listing to left in standing with STEDY lift pad supporting at knees to prevent buckling. Patient could maintian  Standing only long enough to change brief in 2 attempts.  When sitting, able to sit and maintain balance near midline. Patient's brother present and provided information re: Prior level of care, requesting higher level of care.   Pt admitted with above diagnosis.  Pt currently with functional limitations due to the deficits listed below (see PT Problem List). Pt will benefit from skilled PT to increase their independence and safety with mobility to allow discharge to the venue listed below.      Follow Up Recommendations SNF    Equipment Recommendations  None recommended by PT    Recommendations for Other Services   Speech    Precautions / Restrictions Precautions Precautions: Fall Precaution Comments: L hemiparesis, incontinence      Mobility  Bed Mobility Overal bed mobility: Needs Assistance Bed Mobility: Rolling Rolling: Max assist;+2 for physical  assistance;+2 for safety/equipment         General bed mobility comments: multimodal cues, hand over hand  assistance to reach for rails to rioll to both sides, Assistance toflex legs in preparation for roll. Legs will "flop" back  down, pt. does not hold in place.    Transfers Overall transfer level: Needs assistance   Transfers: Sit to/from Stand Sit to Stand: Max assist;+2 safety/equipment;+2 physical assistance;From elevated surface         General transfer comment: extensive assistance  to rise  to stand, patient did grip front bar of STEDY with each hand, listing  heavily to the left when rising and standing. STedy lift blocking knees.  Ambulation/Gait                Stairs            Wheelchair Mobility    Modified Rankin (Stroke Patients Only)       Balance Overall balance assessment: Needs assistance Sitting-balance support: Feet supported;Bilateral upper extremity supported Sitting balance-Leahy Scale: Fair Sitting balance - Comments: sits near midline once positioned on edge   Standing balance support: Bilateral upper extremity supported;During functional activity Standing balance-Leahy Scale: Zero Standing balance comment: requires STEDy, knees blocked                             Pertinent Vitals/Pain Pain Assessment: Faces Faces Pain Scale: No hurt    Home Living Family/patient expects to be discharged to:: Private residence Living Arrangements: Spouse/significant other;Other relatives Available Help at Discharge: Family;Available 24 hours/day Type of  Home: House Home Access: Stairs to enter   [employer] of Steps: 2 Home Layout: One level Home Equipment: Wheelchair - manual      Prior Function Level of Independence: Needs assistance   Gait / Transfers Assistance Needed: nonambulatory, extensive liftinng to transfers to [facility] per brother.  ADL's / Homemaking Assistance Needed: total assistnace required,  incontinent        Hand Dominance   Dominant Hand: Left    Extremity/Trunk Assessment        Lower Extremity Assessment Lower Extremity Assessment: RLE deficits/detail;LLE deficits/detail RLE Deficits / Details: grossly 3-/ 5 knee extension. difficult to assss fully due to cognitive, did bare more weight on right leg when standing LLE Deficits / Details: grossly 2-/5, at hip and knee, leg flopped when placed in flexed position to roll, no control to hold in place, difficult to assess  due to cognitive    Cervical / Trunk Assessment Cervical / Trunk Assessment: Other exceptions Cervical / Trunk Exceptions: does sit near midline,  Communication      Cognition Arousal/Alertness: Awake/alert Behavior During Therapy: Flat affect Overall Cognitive Status: History of cognitive impairments - at baseline                                 General Comments: recent baseline is mostly mute, difficulty following commands. Does not answer questions except his name and brother's namd      General Comments      Exercises     Assessment/Plan    PT Assessment Patient needs continued PT services  PT Problem List Decreased strength;Decreased mobility;Decreased safety awareness;Decreased activity tolerance;Decreased cognition;Decreased balance       PT Treatment Interventions Therapeutic activities;DME instruction;Patient/family education;Cognitive remediation;Balance training;Wheelchair mobility training;Functional mobility training;Neuromuscular re-education    PT Goals (Current goals can be found in the Care Plan section)  Acute Rehab PT Goals Patient Stated Goal: he needs to go to LTC, PT Goal Formulation: With family Time For Goal Achievement: 12/13/20 Potential to Achieve Goals: Fair    Frequency Min 2X/week   Barriers to discharge Decreased caregiver support      Co-evaluation PT/OT/SLP Co-Evaluation/Treatment: Yes Reason for Co-Treatment: For  patient/therapist safety;Complexity of the patient's impairments (multi-system involvement) PT goals addressed during session: Mobility/safety with mobility OT goals addressed during session: ADL's and self-care       AM-PAC PT "6 Clicks" Mobility  Outcome Measure Help needed turning from your back to your side while in a flat bed without using bedrails?: A Lot Help needed moving from lying on your back to sitting on the side of a flat bed without using bedrails?: A Lot Help needed moving to and from a bed to a chair (including a wheelchair)?: Total Help needed standing up from a chair using your arms (e.g., wheelchair or bedside chair)?: Total Help needed to walk in hospital room?: Total Help needed climbing 3-5 steps with a railing? : Total 6 Click Score: 8    End of Session Equipment Utilized During Treatment: Gait belt Activity Tolerance: Patient tolerated treatment well Patient left: in bed;with call bell/phone within reach;with family/visitor present Nurse Communication: Mobility status;Need for lift equipment PT Visit Diagnosis: Other symptoms and signs involving the nervous system (R29.898);Unsteadiness on feet (R26.81);Hemiplegia and hemiparesis Hemiplegia - Right/Left: Left Hemiplegia - dominant/non-dominant: Dominant Hemiplegia - caused by: Unspecified    Time: 9741-6384 PT Time Calculation (min) (ACUTE ONLY): 30 min   Charges:  PT Evaluation $PT Eval Moderate Complexity: [place] Pager [phone] Office [phone]   Claretha Cooper 11/29/2020, 8:54 AM

## 2020-11-29 NOTE — ED Notes (Signed)
Pt ate breakfast. Pt took medication.

## 2020-11-29 NOTE — Progress Notes (Signed)
Occupational Therapy Evaluation  Patient is from home, per chart review spouse has been unable to provide current level of assist for mobility/self care. Patient's brother is in from out of town to provide additional assistance. Per patient's brother patient requiring extensive assistance to transfer to his wheelchair, has been incontinent and extensive assist with ADLs. Patient stating 1 word answers ~25% of time when asked questions, very minimal verbal interaction. Currently patient max x2 for bed mobility with multimodal cues and total A x2 to power up to standing with stedy at edge of bed twice. Patient with increased L sided weakness and collapsing to L side in standing. Patient with guarded potential, however will trial acute OT services to try and increase patient strength, safety/cognition in order to reduce caregiver burden.     11/29/20 1026  OT Visit Information  Last OT Received On 11/29/20  Assistance Needed +2  PT/OT/SLP Co-Evaluation/Treatment Yes  History of Present Illness Pt is a 55 y.o. male who presents   by Adult Protective Services for skilled nursing placement.  Patient has metastatic melanoma to the brain. Patient's wife lives with him and reportedly cannot really take care of him , brother has been assisting. Pt has hx of multiple brain mets and craniotomy with tumor resection on the L 09/29/2020. Marland Kitchen  Precautions  Precautions Fall  Precaution Comments L weakness, incontinence  Restrictions  Weight Bearing Restrictions No  Home Living  Family/patient expects to be discharged to: Private residence  Living Arrangements Spouse/significant other;Other relatives  Available Help at Discharge Family;Available 24 hours/day  Type of [place] to enter  Entrance Stairs-Number of Steps 2  Entrance Stairs-Rails None  Home Layout One level  Bathroom Shower/Tub Tub/shower unit  Bathroom Toilet Handicapped height  Bathroom Accessibility Yes  How Accessible  Accessible via walker  Home Equipment Wheelchair - manual  Additional Comments 2 dogs, info taken from previous admission   Lives With Spouse  Prior Function  Level of Independence Needs assistance  Gait / Transfers Assistance Needed nonambulatory, extensive lifting to transfers to [facility] per brother.  ADL's / Homemaking Assistance Needed total A required, incontinent  Communication / Swallowing Assistance Needed very limited verbal interaction with therapy, stated his name and his brother's name otherwise not conversive  Communication  Communication Expressive difficulties  Pain Assessment  Pain Assessment Faces  Faces Pain Scale 0  Cognition  Arousal/Alertness Awake/alert  Behavior During Therapy Flat affect  Overall Cognitive Status History of cognitive impairments - at baseline  General Comments recent baseline is mostly non-conversive. would not answer any orientation questions. delayed initiation and following directions ~25% accuracy  Upper Extremity Assessment  Upper Extremity Assessment LUE deficits/detail  LUE Deficits / Details increased weakness compared to R UE grossly 3/5, shoulder flexion AROM ~100 degrees  LUE  (unable to fully assess due to cognition)  LUE Coordination decreased fine motor;decreased gross motor  Lower Extremity Assessment  Lower Extremity Assessment Defer to PT evaluation  ADL  Overall ADL's  Needs assistance/impaired  Eating/Feeding Maximal assistance  Grooming Maximal assistance;Bed level;Wash/dry face  Grooming Details (indicate cue type and reason) patient takes wash cloth however minimally washes his face  Upper Body Bathing Maximal assistance;Bed level  Lower Body Bathing Total assistance  Upper Body Dressing  Maximal assistance;Sitting  Upper Body Dressing Details (indicate cue type and reason) patient trying to pull at gown, needs max cues and assist to thread UEs  Lower Body Dressing Total assistance;Bed level  Lower Body Dressing  Details  (indicate cue type and reason) to don socks  Toilet Transfer Total assistance;+2 for physical assistance;+2 for safety/equipment  Toilet Transfer Details (indicate cue type and reason) with stedy patient needing total A x2 for sit to stand twice from elevated bed height. minimally provides assist with UEs on stedy with significant L sided weakness/lean  Toileting- Clothing Manipulation and Hygiene Total assistance  Toileting - Clothing Manipulation Details (indicate cue type and reason) incontinent  Functional mobility during ADLs Total assistance;+2 for physical assistance;+2 for safety/equipment  Bed Mobility  Overal bed mobility Needs Assistance  Bed Mobility Rolling;Supine to Sit;Sit to Supine  Rolling Max assist;+2 for physical assistance;+2 for safety/equipment  Supine to sit Max assist;+2 for physical assistance;+2 for safety/equipment;HOB elevated  Sit to supine Max assist;+2 for physical assistance;+2 for safety/equipment  General bed mobility comments multimodal cues with hand over hand assist to reach for bed rails  Transfers  Overall transfer level Needs assistance  Transfer via Lift Equipment Stedy  Transfers Sit to/from Stand  Sit to Stand Total assist;+2 physical assistance;+2 safety/equipment;From elevated surface  General transfer comment please see toilet transfer in ADL section  Balance  Overall balance assessment Needs assistance  Sitting-balance support Feet supported  Sitting balance-Leahy Scale Fair  Sitting balance - Comments sits near midline once positioned on edge  Standing balance support Bilateral upper extremity supported;During functional activity  Standing balance-Leahy Scale Zero  Standing balance comment requires STEDy, knees blocked  OT - End of Session  Equipment Utilized During Treatment Gait belt  Activity Tolerance Patient tolerated treatment well  Patient left in bed;with call bell/phone within reach;with bed alarm set;with family/visitor present   Nurse Communication Mobility status;Need for lift equipment  OT Assessment  OT Recommendation/Assessment Patient needs continued OT Services  OT Visit Diagnosis Other abnormalities of gait and mobility (R26.89);Muscle weakness (generalized) (M62.81);Other symptoms and signs involving cognitive function  OT Problem List Decreased strength;Decreased activity tolerance;Impaired balance (sitting and/or standing);Decreased cognition;Decreased safety awareness;Decreased coordination  Barriers to Discharge Decreased caregiver support  Barriers to Discharge Comments patient's spouse unable to provide current level of care at home  OT Plan  OT Frequency (ACUTE ONLY) Min 2X/week  OT Treatment/Interventions (ACUTE ONLY) Self-care/ADL training;Therapeutic exercise;Therapeutic activities;Cognitive remediation/compensation;Patient/family education;Balance training  AM-PAC OT "6 Clicks" Daily Activity Outcome Measure (Version 2)  Help from another person eating meals? 2  Help from another person taking care of personal grooming? 2  Help from another person toileting, which includes using toliet, bedpan, or urinal? 1  Help from another person bathing (including washing, rinsing, drying)? 2  Help from another person to put on and taking off regular upper body clothing? 2  Help from another person to put on and taking off regular lower body clothing? 1  6 Click Score 10  OT Recommendation  Follow Up Recommendations SNF  OT Equipment None recommended by OT  Individuals Consulted  Consulted and Agree with Results and Recommendations Family member/caregiver  Family Member Consulted brother  Acute Rehab OT Goals  Patient Stated Goal he needs to go to LTC,  OT Goal Formulation With family  Time For Goal Achievement 12/13/20  Potential to Achieve Goals Fair  OT Time Calculation  OT Start Time (ACUTE ONLY) 0748  OT Stop Time (ACUTE ONLY) 3154  OT Time Calculation (min) 24 min  OT General Charges  $OT  Visit 1 Visit  OT Evaluation  $OT Eval Moderate Complexity 1 Mod  Written Expression  Dominant Hand Left   Delbert Phenix OT OT  pager: [phone]

## 2020-11-29 NOTE — ED Notes (Signed)
Pt at lunch and dinner. Pt has been fed. Pt has been resting comfortably this shift. Cooperative with care.

## 2020-11-29 NOTE — Progress Notes (Signed)
[facility] Work  Holiday representative received a call from patients wife stating patient was currently in the [facility] ED.  CSW provided supportive listening as patients wife expressed concerns.  CSW encouraged patients wife to contact DME provider for an update on the status of equipment that has been ordered.    CSW spoke with APS caseworker Shara Blazing who provided update on status of APS case.    Johnnye Lana, MSW, LCSW, OSW-C Clinical Social Worker [facility] [phone]

## 2020-11-29 NOTE — ED Notes (Signed)
PT working with pt. Pt brother visiting.

## 2020-11-29 NOTE — ED Notes (Signed)
Pt calm, resting comfortably in bed.

## 2020-11-29 NOTE — Progress Notes (Signed)
IP PROGRESS NOTE  Subjective:   Sean [name] is known to me with a history of metastatic malignant melanoma.  He was seen at the cancer center and completed treatment with nivolumab on 11/27/2020. He was transferred to the emergency room yesterday after his family contacted Adult Protective Services.  His brother is at the bedside this morning.  He reports Sean [name] is no longer able to care for him in the home.  He is dependent on family members for all of his care.  His family would like to proceed with skilled nursing facility placement.  His brother reports Sean [name] has "headaches ".  No other complaint.  Objective: Vital signs in last 24 hours: Blood pressure 126/85, pulse 77, temperature 97.7 F (36.5 C), temperature source Oral, resp. rate 16, height 5\' 11"  (1.803 m), weight 175 lb (79.4 kg), SpO2 97 %.  Intake/Output from previous day: No intake/output data recorded.  Physical Exam: He appears comfortable HEENT: No thrush Lungs: Clear anteriorly, no respiratory distress Cardiac: Regular rate and rhythm Abdomen: No hepatosplenomegaly, no mass Extremities: No leg edema Neurologic: Alert, moves all extremities, follows some simple commands, not communicating in sentences, occasionally says a few words   Lab Results: Recent Labs    11/27/20 1034  WBC 11.5*  HGB 14.7  HCT 43.7  PLT 321    BMET Recent Labs    11/27/20 1034  NA 141  K 3.8  CL 105  CO2 27  GLUCOSE 111*  BUN 18  CREATININE 0.67  CALCIUM 9.5    Lab Results  Component Value Date   CEA1 0.6 09/15/2020     Medications: I have reviewed the patient's current medications.  Assessment/Plan:   1. 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, unknown primary tumor site  CT brain 09/14/2020-multiple hemorrhagic metastases with surrounding edema in the bilateral cerebral hemispheres  CTs chest, abdomen, pelvis 09/14/2020-1.4 cm right upper lobe subpleural  nodule, 0.5 cm right lower lobe nodule, multiple hypodensities in the liver suspicious for metastases, 1.5 cm right upper pole hypodense kidney lesion-indeterminate  MRI abdomen 09/15/2020-multifocal T1 hyperintense liver lesions, no kidney mass  Ultrasound-guided biopsy of a segment 4B liver lesion 09/18/2020-benign liver parenchyma with steatosis, no evidence of malignancy  PET 09/25/2020-focal area of hypermetabolism in the right hilum, multiple areas of the liver, pelvic musculature, and left thigh-no CT correlate with any of these areas on the noncontrast CT, 10 mm anterior right upper lobe nodule with low-level hypermetabolism, additional tiny pulmonary nodules too small to characterize by PET  SRS to 10 brain lesions 09/27/2020  Resection of left occipital and left frontal lesions on 09/29/2020-pathology consistent with malignant melanoma  Cycle 1 ipilimumab/nivolumab 10/16/2020  CT of the brain without contrast 10/26/2020 showed significant interval increase in the number and size of numerous metastases  Brain MRI 10/24/2020-multiple new and increased brain metastases, moderate edema surrounding multiple lesions, new mass-effect at the right lateral ventricle  Brain MRI 11/01/2020-stable appearance of his multiple intracranial metastases and associated edema and mild mass-effect.  CT of the chest with contrast 11/01/2020-interval progression of bilateral pulmonary nodules concerning for metastatic disease, small right hilar lymph nodes, new and progressive ill-defined lesions in the liver compatible with progression of metastatic involvement.  Cycle 2 nivolumab 11/10/2020  Cycle 3 nivolumab 11/27/2020 2.Left-sided weakness, new onset seizure secondary to #1, weakness has resolved 3.Hyperlipidemia 4.Elevated liver enzymes and bilirubin  Liver enzymes more elevated on repeat labs 09/27/2020, improved1/13/2022 5.Headache and nausea/vomiting secondary to #1, resolved  [facility]  admission 10/20/2020-encephalopathy, slow Decadron taper-down to 2 mg at discharge 11/09/2020 7.  Transfer to emergency room 11/28/2020 with failure to thrive, family no longer able to care for him in the home  Sean [name] has metastatic melanoma.  He has metastatic disease involving the brain, lungs, and liver.  He is currently being treated with single agent nivolumab, last given on 11/27/2020.  He has severe alteration of his mental status secondary to extensive brain metastases.  This has not improved over the past month despite Decadron and continuing nivolumab.  His family understands the poor prognosis.  I discussed the case with his brother this morning.  I recommend the family come do a decision on a healthcare power of attorney for Sean [name].  This could be his [name] or one of his siblings.  It is clear he is not capable of self-care or decision making. I recommend skilled nursing facility placement.  He is a candidate for hospice care if a decision is made to forego further systemic therapy.  He was scheduled for a restaging brain MRI last week, but was unable to complete the study.  I do not recommend rescheduling the MRI if he enters hospice care.       LOS: 0 days   Betsy Coder, MD   11/29/2020, 10:03 AM

## 2020-11-29 NOTE — NC FL2 (Signed)
[place] LEVEL OF CARE SCREENING TOOL     IDENTIFICATION  Patient Name: Sean [name] Birthdate: 07/23/1966 Sex: male Admission Date (Current Location): 11/28/2020  [facility] [place] Number:  Herbalist and Address:  [facility],  [place] [address], [place]      Provider Number: [id number]  Attending Physician Name and Address:  Default, Provider, MD  Relative Name and Phone Number:  Lyndle Pang [phone]    Current Level of Care: Hospital Recommended Level of Care: [place] Prior Approval Number:    Date Approved/Denied:   PASRR Number: [id number]  Discharge Plan: SNF    Current Diagnoses: Patient Active Problem List   Diagnosis Date Noted  . Malignant melanoma ([place]) 10/26/2020  . Brain metastasis ([place]) 10/20/2020  . Melanoma of skin ([place]) 10/05/2020  . Status post craniotomy 09/29/2020  . Brain tumor ([place]) 09/29/2020  . Elevated blood pressure reading 09/15/2020  . Malignant melanoma metastatic to brain ([place]) 09/15/2020  . Uncomplicated alcohol dependence ([place])   . Seizure ([place])   . Hypokalemia   . Hyponatremia   . Elevated LFTs   . Brain metastases ([place]) 09/14/2020    Orientation RESPIRATION BLADDER Height & Weight     Self  Normal Incontinent Weight: 175 lb (79.4 kg) Height:  5\' 11"  (180.3 cm)  BEHAVIORAL SYMPTOMS/MOOD NEUROLOGICAL BOWEL NUTRITION STATUS      Incontinent Diet (Regular)  AMBULATORY STATUS COMMUNICATION OF NEEDS Skin   Limited Assist Verbally Normal                       Personal Care Assistance Level of Assistance  Bathing,Feeding,Dressing Bathing Assistance: Limited assistance Feeding assistance: Independent Dressing Assistance: Limited assistance     Functional Limitations Info  Sight,Hearing,Speech Sight Info: Adequate Hearing Info: Adequate Speech Info: Adequate    SPECIAL CARE FACTORS FREQUENCY  PT (By licensed PT),OT (By licensed OT)     PT  Frequency: 5x a week OT Frequency: 5x a week            Contractures Contractures Info: Not present    Additional Factors Info  Code Status,Allergies Code Status Info: Full Allergies Info: NKA           Current Medications (11/29/2020):  This is the current hospital active medication list Current Facility-Administered Medications  Medication Dose Route Frequency Provider Last Rate Last Admin  . acetaminophen (TYLENOL) tablet 650 mg  650 mg Oral Q6H PRN Davonna Belling, MD   650 mg at 11/28/20 2216  . dexamethasone (DECADRON) tablet 2 mg  2 mg Oral Daily Davonna Belling, MD   2 mg at 11/29/20 0914  . HYDROcodone-acetaminophen (NORCO/VICODIN) 5-325 MG per tablet 1 tablet  1 tablet Oral Q6H PRN Davonna Belling, MD   1 tablet at 11/29/20 0436  . levETIRAcetam (KEPPRA) tablet 500 mg  500 mg Oral BID Davonna Belling, MD   500 mg at 11/29/20 0914  . melatonin tablet 5 mg  5 mg Oral QHS PRN Davonna Belling, MD   5 mg at 11/28/20 2215  . methylphenidate (RITALIN) tablet 5 mg  5 mg Oral BID Davonna Belling, MD   5 mg at 11/29/20 1133  . pantoprazole (PROTONIX) EC tablet 40 mg  40 mg Oral Daily Davonna Belling, MD   40 mg at 11/29/20 7035   Current Outpatient Medications  Medication Sig Dispense Refill  . acetaminophen (TYLENOL) 325 MG tablet Take 2 tablets (650 mg total) by  mouth every 6 (six) hours as needed for mild pain (or Fever >/= 101).    . citalopram (CELEXA) 10 MG tablet Take 1 tablet (10 mg total) by mouth at bedtime. 30 tablet 0  . dexamethasone (DECADRON) 2 MG tablet Take 1 tablet (2 mg total) by mouth daily. 30 tablet 0  . HYDROcodone-acetaminophen (NORCO/VICODIN) 5-325 MG tablet Take 1 tablet by mouth every 6 (six) hours as needed for moderate pain. 30 tablet 0  . levETIRAcetam (KEPPRA) 500 MG tablet Take 1 tablet (500 mg total) by mouth 2 (two) times daily. 60 tablet 0  . melatonin 5 MG TABS Take 1 tablet (5 mg total) by mouth at bedtime as needed. 30 tablet 0   . methylphenidate (RITALIN) 5 MG tablet Take 1 tablet (5 mg total) by mouth 2 (two) times daily. 60 tablet 0  . Multiple Vitamin (MULTIVITAMIN WITH MINERALS) TABS tablet Take 1 tablet by mouth daily. (Patient taking differently: Take 2 tablets by mouth daily. gummy)    . pantoprazole (PROTONIX) 40 MG tablet Take 1 tablet (40 mg total) by mouth daily. 30 tablet 0  . senna-docusate (SENOKOT-S) 8.6-50 MG tablet Take 2 tablets by mouth at bedtime.       Discharge Medications: Please see discharge summary for a list of discharge medications.  Relevant Imaging Results:  Relevant Lab Results:   Additional Information SSN#:  [id number]  Berel Najjar C Tarpley-Carter, LCSWA

## 2020-11-30 ENCOUNTER — Encounter: Payer: Self-pay | Admitting: General Practice

## 2020-11-30 NOTE — Progress Notes (Signed)
IP PROGRESS NOTE  Subjective:   Mr. Tiegs remains in the holding area of the emergency room.  No family is present this morning.  He denies a headache at present.  No complaint. Objective: Vital signs in last 24 hours: Blood pressure 131/86, pulse (!) 101, temperature 98.2 F (36.8 C), temperature source Oral, resp. rate 16, height 5\' 11"  (1.803 m), weight 175 lb (79.4 kg), SpO2 99 %.  Intake/Output from previous day: 03/16 0701 - 03/17 0700 In: 240 [P.O.:240] Out: 1 [Urine:1]  Physical Exam: He appears comfortable  Neurologic: Alert, moves all extremities, follows some simple commands, not communicating in sentences, speaking more today, but remains confused   Lab Results: No results for input(s): WBC, HGB, HCT, PLT in the last 72 hours.  BMET No results for input(s): NA, K, CL, CO2, GLUCOSE, BUN, CREATININE, CALCIUM in the last 72 hours.  Lab Results  Component Value Date   CEA1 0.6 09/15/2020     Medications: I have reviewed the patient's current medications.  Assessment/Plan:   1. 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, unknown primary tumor site  CT brain 09/14/2020-multiple hemorrhagic metastases with surrounding edema in the bilateral cerebral hemispheres  CTs chest, abdomen, pelvis 09/14/2020-1.4 cm right upper lobe subpleural nodule, 0.5 cm right lower lobe nodule, multiple hypodensities in the liver suspicious for metastases, 1.5 cm right upper pole hypodense kidney lesion-indeterminate  MRI abdomen 09/15/2020-multifocal T1 hyperintense liver lesions, no kidney mass  Ultrasound-guided biopsy of a segment 4B liver lesion 09/18/2020-benign liver parenchyma with steatosis, no evidence of malignancy  PET 09/25/2020-focal area of hypermetabolism in the right hilum, multiple areas of the liver, pelvic musculature, and left thigh-no CT correlate with any of these areas on the noncontrast CT, 10 mm anterior right upper lobe nodule  with low-level hypermetabolism, additional tiny pulmonary nodules too small to characterize by PET  SRS to 10 brain lesions 09/27/2020  Resection of left occipital and left frontal lesions on 09/29/2020-pathology consistent with malignant melanoma  Cycle 1 ipilimumab/nivolumab 10/16/2020  CT of the brain without contrast 10/26/2020 showed significant interval increase in the number and size of numerous metastases  Brain MRI 10/24/2020-multiple new and increased brain metastases, moderate edema surrounding multiple lesions, new mass-effect at the right lateral ventricle  Brain MRI 11/01/2020-stable appearance of his multiple intracranial metastases and associated edema and mild mass-effect.  CT of the chest with contrast 11/01/2020-interval progression of bilateral pulmonary nodules concerning for metastatic disease, small right hilar lymph nodes, new and progressive ill-defined lesions in the liver compatible with progression of metastatic involvement.  Cycle 2 nivolumab 11/10/2020  Cycle 3 nivolumab 11/27/2020 2.Left-sided weakness, new onset seizure secondary to #1, weakness has resolved 3.Hyperlipidemia 4.Elevated liver enzymes and bilirubin  Liver enzymes more elevated on repeat labs 09/27/2020, improved1/13/2022 5.Headache and nausea/vomiting secondary to #1, resolved 6. Hospital admission 10/20/2020-encephalopathy, slow Decadron taper-down to 2 mg at discharge 11/09/2020 7.  Transfer to emergency room 11/28/2020 with failure to thrive, family no longer able to care for him in the home  Mr. Matlock has metastatic melanoma.  He has metastatic disease involving the brain, lungs, and liver.  He is currently being treated with single agent nivolumab, last given on 11/27/2020.  He appears unchanged today.  I discussed the case with his brother by telephone.  He indicates family members are in agreement to pursue skilled nursing facility placement and comfort care.  We discussed placement  options including residential hospice, skilled nursing facility, and skilled nursing facility with hospice  care.  He does not appear to be a candidate for residential hospice at present.  I recommend skilled nursing facility placement with palliative care/hospice.   Adult Protective Services has requested a meeting with myself and his family.  I will try to arrange for the meeting tomorrow or early next week.      LOS: 0 days   Betsy Coder, MD   11/30/2020, 6:08 PM

## 2020-11-30 NOTE — ED Notes (Signed)
Pt brother, [name], at bedside. He has spoken with his other siblings, and they have decided on comfort/hospice care for pt. [name] has communicated this information with social work.

## 2020-11-30 NOTE — Progress Notes (Signed)
[place] CSW Progress Notes  Call from Natividad Brood, [place] [phone] office, [phone] cell).  He is investigating situation and patient has an open APS case at this time.  Medical oncologist made aware that APS worker would like meeting w oncologist, patient's wife and patient's brothers to discuss patient's situation and help family better work together on patient's behalf.  Edwyna Shell, LCSW Clinical Social Worker Phone:  [phone]

## 2020-11-30 NOTE — ED Notes (Signed)
Pt bedding changed and also fed breakfast.

## 2020-11-30 NOTE — ED Notes (Signed)
I fed pt dinner he ate 100%,afterwards linen and brief was changed, condom catheter placed.

## 2020-11-30 NOTE — ED Notes (Signed)
I assisted pt with lunch pt ate 100%

## 2020-11-30 NOTE — Progress Notes (Signed)
TOC CM/CSW contacted [name]/[name].  Pts Sean [name] is still pending.  CSW will continue to follow for dc needs.  Ricquita Tarpley-Carter, MSW, LCSW-A Pronouns:  She, Her, [place] ED Transitions of CareClinical Social Worker [email or web address] [phone]

## 2020-11-30 NOTE — Progress Notes (Signed)
TOC CM/CSW received a call from [facility]/DSS APS.  CSW provided [place] with an update on pts with [place].  [place] provided CSW with an update on wife and her involvement with pt.  CSW will continue to follow for dc needs.  Ricquita Tarpley-Carter, MSW, LCSW-A Pronouns:  She, Her, Hers                  [place] ED Transitions of CareClinical Social Worker [email or web address] [phone]

## 2020-11-30 NOTE — Progress Notes (Signed)
[place] CSW Progress Notes  Proof of submission for Social Security Disability on 11/29/2020 provided by [facility], case filed as a TERI case.    Edwyna Shell, LCSW Clinical Social Worker Phone:  [phone]

## 2020-12-01 ENCOUNTER — Encounter: Payer: Self-pay | Admitting: General Practice

## 2020-12-01 ENCOUNTER — Other Ambulatory Visit: Payer: Self-pay | Admitting: *Deleted

## 2020-12-01 ENCOUNTER — Telehealth: Payer: Self-pay

## 2020-12-01 DIAGNOSIS — C439 Malignant melanoma of skin, unspecified: Secondary | ICD-10-CM

## 2020-12-01 DIAGNOSIS — C7931 Secondary malignant neoplasm of brain: Secondary | ICD-10-CM

## 2020-12-01 LAB — RESP PANEL BY RT-PCR (FLU A&B, COVID) ARPGX2
Influenza A by PCR: NEGATIVE
Influenza B by PCR: NEGATIVE
SARS Coronavirus 2 by RT PCR: NEGATIVE

## 2020-12-01 NOTE — Progress Notes (Signed)
[facility] CSW Progress Notes  Family meeting via WebEx with Dr [name],  Adult Protective Services (APS) Letta Moynahan), [place] (GAL) (B Boring), wife (D Bellantoni), patient's brother Ruth Tully) and sister Bradly Chris).  Medical oncologist reviewed patient's diagnosis, current condition, treatment options and thoughts on placement (home vs SNF).  After reviewing options, family is comfortable with patient transitioning from ED to SNF [facility] places currently underway).  Plan is for patient to have another immunotherapy treatment and evaluate progress via scans.  Dr [name] will set these up.  Patient's wife, Kymari Nuon, will be primary contact person.  Per GAL, there will be a hearing for assignment of a guardian on 3/24 - GAL will inform [place] who has been appointed by the judge.  At that point, the judge's decision on guardian will stand.    Edwyna Shell, LCSW Clinical Social Worker Phone:  [phone]

## 2020-12-01 NOTE — ED Notes (Signed)
Attempted to call report 7 times. Three times I was transferred and hung up on and the 2nd number went straight to a voicemail. SW aware that RN attempted to make contact. PTAR has been called. Charge aware.

## 2020-12-01 NOTE — ED Notes (Signed)
Attempted to call wife and son both went to voicemail

## 2020-12-01 NOTE — ED Notes (Signed)
Pt brother has been in room visiting since 0745. Brother states he wants to leave and come back throughout the day and the wife would be coming. Spoke with charge about visiting rules and charge states its one per per day and he can leave one time and come back.

## 2020-12-01 NOTE — Progress Notes (Signed)
IP PROGRESS NOTE  Subjective:   Sean [name] remains in the holding area of the emergency room.  His brother is at the bedside.  He denies a headache at present.  No complaint. Objective: Vital signs in last 24 hours: Blood pressure 104/86, pulse 96, temperature 98.5 F (36.9 C), temperature source Oral, resp. rate 18, height 5\' 11"  (1.803 m), weight 175 lb (79.4 kg), SpO2 96 %.  Intake/Output from previous day: 03/17 0701 - 03/18 0700 In: -  Out: 901 [Urine:900; Stool:1]  Physical Exam: He appears comfortable  Neurologic: Alert, moves all extremities, follows some simple commands, not communicating in sentences,  remains confused.   Medications: I have reviewed the patient's current medications.  Assessment/Plan:   1. 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, unknown primary tumor site  CT brain 09/14/2020-multiple hemorrhagic metastases with surrounding edema in the bilateral cerebral hemispheres  CTs chest, abdomen, pelvis 09/14/2020-1.4 cm right upper lobe subpleural nodule, 0.5 cm right lower lobe nodule, multiple hypodensities in the liver suspicious for metastases, 1.5 cm right upper pole hypodense kidney lesion-indeterminate  MRI abdomen 09/15/2020-multifocal T1 hyperintense liver lesions, no kidney mass  Ultrasound-guided biopsy of a segment 4B liver lesion 09/18/2020-benign liver parenchyma with steatosis, no evidence of malignancy  PET 09/25/2020-focal area of hypermetabolism in the right hilum, multiple areas of the liver, pelvic musculature, and left thigh-no CT correlate with any of these areas on the noncontrast CT, 10 mm anterior right upper lobe nodule with low-level hypermetabolism, additional tiny pulmonary nodules too small to characterize by PET  SRS to 10 brain lesions 09/27/2020  Resection of left occipital and left frontal lesions on 09/29/2020-pathology consistent with malignant melanoma  Cycle 1 ipilimumab/nivolumab  10/16/2020  CT of the brain without contrast 10/26/2020 showed significant interval increase in the number and size of numerous metastases  Brain MRI 10/24/2020-multiple new and increased brain metastases, moderate edema surrounding multiple lesions, new mass-effect at the right lateral ventricle  Brain MRI 11/01/2020-stable appearance of his multiple intracranial metastases and associated edema and mild mass-effect.  CT of the chest with contrast 11/01/2020-interval progression of bilateral pulmonary nodules concerning for metastatic disease, small right hilar lymph nodes, new and progressive ill-defined lesions in the liver compatible with progression of metastatic involvement.  Cycle 2 nivolumab 11/10/2020  Cycle 3 nivolumab 11/27/2020 2.Left-sided weakness, new onset seizure secondary to #1, weakness has resolved 3.Hyperlipidemia 4.Elevated liver enzymes and bilirubin  Liver enzymes more elevated on repeat labs 09/27/2020, improved1/13/2022 5.Headache and nausea/vomiting secondary to #1, resolved 6. Hospital admission 10/20/2020-encephalopathy, slow Decadron taper-down to 2 mg at discharge 11/09/2020 7.  Transfer to emergency room 11/28/2020 with failure to thrive, family no longer able to care for him in the home  Sean [name] has metastatic melanoma.  He has metastatic disease involving the brain, lungs, and liver.  He is currently being treated with single agent nivolumab, last given on 11/27/2020.  He remains confused.  He appears comfortable.  He has neurologic status does not appear changed over the past week.  I discussed disposition plans with his brother this morning.  We had a family meeting this afternoon which included his brother, sister, wife, social work, and a Wellsite geologist from [employer].  We discussed his prognosis, treatment options, and disposition plan.  His family is most comfortable obtaining additional staging information prior to making a decision  on continuing treatment versus hospice care.  He will be transferred to a skilled nursing facility today.  We will try to arrange  for an outpatient brain MRI and chest CT prior to a follow-up office visit on 12/11/2020.  A temporary legal guardian will be appointed next week.  This will be our contact person going forward.       LOS: 0 days   Betsy Coder, MD   12/01/2020, 4:11 PM

## 2020-12-01 NOTE — Progress Notes (Signed)
Placed orders for CT chest w/contrast and Dr. [name] requesting both on same day prior to his OV on 12/11/20. Sent message to Dr. Mickeal Skinner requesting to reorder the MRI brain and to address his sedation requirements. Dr. [name] had WebEx family meeting today with APS, [place], wife and patient's sister and attorney working on guardianship.

## 2020-12-01 NOTE — Progress Notes (Signed)
TOC CM/CSW has updated Dorian Pod, RN of pts transfer to [facility].  [facility] information as follows:  Room #:  [place]  Call Report:  [phone], Ask for [facility] 1st floor RN.  CSW will continue to follow for dc needs.  Ricquita Tarpley-Carter, MSW, LCSW-A Pronouns:  She, Her, Hers                  [place] ED Transitions of CareClinical Social Worker [email or web address] [phone]

## 2020-12-01 NOTE — Progress Notes (Signed)
[place] CSW Progress Notes  Call from Shara Blazing, [place] investigator, he is working on an open investigation per report to APS.  He requests a family meeting w medical oncologist - meeting to involve wife, siblings, APS.  Worker hopes this will allow oncologist to clearly communicate information about patient's condition and needs to all interested parties at one time.   Call from [facility] ([email or web address] or [phone]), assigned as Guardian Ad Litem through [place] system.  She is in process of investigating as a petition for emergency guardianship has been filed.  She requests to be added to the family meeting as she also has an interest in this situation.  Per Letta Moynahan, he is in agreement with the inclusion of Guardian Ad Litem.  Meeting to be scheduled for 4 PM today 12/01/2020.  Edwyna Shell, LCSW Clinical Social Worker Phone:  [phone]

## 2020-12-01 NOTE — Progress Notes (Signed)
TOC CM/CSW spoke with Sean [name]/pts wife and HCPOA [phone].  Sean [name] is in Environmental consultant with family and doctor that pt needs a higher level of care than she can offer at this time, but she is willing to assume care when needed.    CSW also spoke with Sean [name]/DSS APS.  Sean Liter stated that if everyone is on board with pt being transferred to [place] to proceed.  CSW will continue to follow for dc needs.  Ricquita Tarpley-Carter, MSW, LCSW-A Pronouns:  She, Her, [place] ED Transitions of CareClinical Social Worker [email or web address] [phone]

## 2020-12-01 NOTE — ED Notes (Signed)
Attempted 3 times to call report. Each time being put on hold or

## 2020-12-01 NOTE — Telephone Encounter (Signed)
SW returned phone call to [facility] APS who has an open APS case. SW confirmed upcoming meeting with oncologist and family per EMR. This SW has no further follow-up as pt d/c from [facility].

## 2020-12-01 NOTE — Progress Notes (Signed)
TOC CM/CSW received notification from [facility] that Josem Kaufmann has been approved.  Tena requested documents be faxed for upper management to review.  CSW is currently awaiting call report # and room #.  CSW will continue to follow for dc needs.  Ricquita Tarpley-Carter, MSW, LCSW-A Pronouns:  She, Her, [place] ED Transitions of [place] [email or web address] [phone]

## 2020-12-01 NOTE — ED Notes (Signed)
Pt soiled brie

## 2020-12-01 NOTE — ED Notes (Signed)
Ptar contacted , spoke with Rick 1500

## 2020-12-04 ENCOUNTER — Other Ambulatory Visit: Payer: Self-pay | Admitting: Internal Medicine

## 2020-12-04 DIAGNOSIS — C7931 Secondary malignant neoplasm of brain: Secondary | ICD-10-CM

## 2020-12-04 MED ORDER — LORAZEPAM 1 MG PO TABS: 1.0000 mg | ORAL_TABLET | Freq: Once | ORAL | 0 refills | Status: DC | PRN

## 2020-12-05 ENCOUNTER — Other Ambulatory Visit: Payer: Self-pay | Admitting: Radiation Therapy

## 2020-12-05 ENCOUNTER — Institutional Professional Consult (permissible substitution): Payer: 59 | Admitting: Radiation Oncology

## 2020-12-05 ENCOUNTER — Ambulatory Visit: Payer: 59 | Admitting: Radiation Oncology

## 2020-12-06 ENCOUNTER — Encounter: Payer: Self-pay | Admitting: *Deleted

## 2020-12-06 NOTE — Progress Notes (Signed)
Faxed his CT, MRI and OV appointments to [place] to forward to their transportation coordinator. Dr. Mickeal Skinner will sign MRI premed order when back in office 12/08/20 and this will be forwarded to nurses at that time.

## 2020-12-07 ENCOUNTER — Telehealth: Payer: Self-pay | Admitting: *Deleted

## 2020-12-07 NOTE — Telephone Encounter (Signed)
Mrs. Mcburney left VM requesting clarification on Jun's appointments and why they CT chest and MRI were not scheduled on same day. Also asking if it would be reasonable to hold off on his immunotherapy on 2/28 until all scan results are back. Faxed Ativan orders written by Dr. Mickeal Skinner for premed for MRI to [facility] [phone].

## 2020-12-07 NOTE — Telephone Encounter (Signed)
Per Dr. [name]: It is reasonable to cancel appointments on 3/28 until after all scan results are seen. Scheduling message sent. Also faxed order to [facility] for side rails on bed for fall prevention. Unable to locate the handicap parking application that was left at front desk. Will have MD sign another and wife will be notified when ready for pick up. Also informed her that Dr. Mickeal Skinner signed and premed order for ativan was sent to [facility] as well.

## 2020-12-08 ENCOUNTER — Other Ambulatory Visit: Payer: Self-pay

## 2020-12-08 ENCOUNTER — Telehealth: Payer: Self-pay | Admitting: Oncology

## 2020-12-08 ENCOUNTER — Encounter (HOSPITAL_COMMUNITY): Payer: Self-pay

## 2020-12-08 ENCOUNTER — Ambulatory Visit (HOSPITAL_COMMUNITY)
Admission: RE | Admit: 2020-12-08 | Discharge: 2020-12-08 | Disposition: A | Discharge status: Home / Self Care | Payer: 59 | Source: Ambulatory Visit | Attending: Oncology | Admitting: Oncology

## 2020-12-08 DIAGNOSIS — C439 Malignant melanoma of skin, unspecified: Secondary | ICD-10-CM | POA: Insufficient documentation

## 2020-12-08 DIAGNOSIS — C7931 Secondary malignant neoplasm of brain: Secondary | ICD-10-CM | POA: Insufficient documentation

## 2020-12-08 IMAGING — CT CT CHEST W/ CM
2 of 4 series · 15 of 36 positions shown, 18 images · IV contrast (omnipaque)
Comparison: [DATE]

CLINICAL DATA: Metastatic melanoma, immunotherapy ongoing

EXAM:
CT CHEST WITH CONTRAST
TECHNIQUE: Multidetector CT imaging of the chest was performed during
intravenous contrast administration.
CONTRAST:  75mL OMNIPAQUE IOHEXOL 300 MG/ML  SOLN

[Series 2: axial st · axial · 0.79mm/px · z∈[+1434,+1698]mm · 12 of 156 slices shown, 15 images]
[im 12/156  mediastinal]
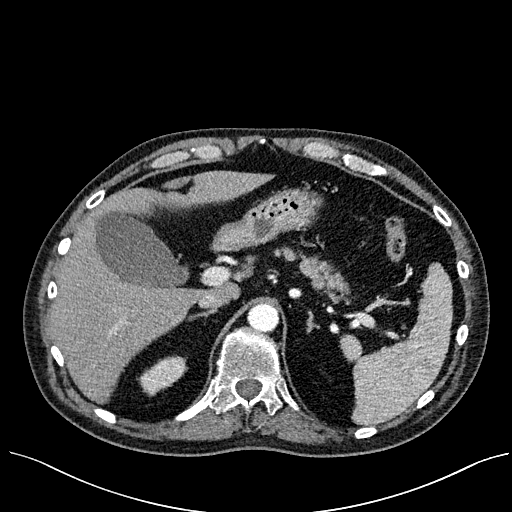
[im 12/156  lung]
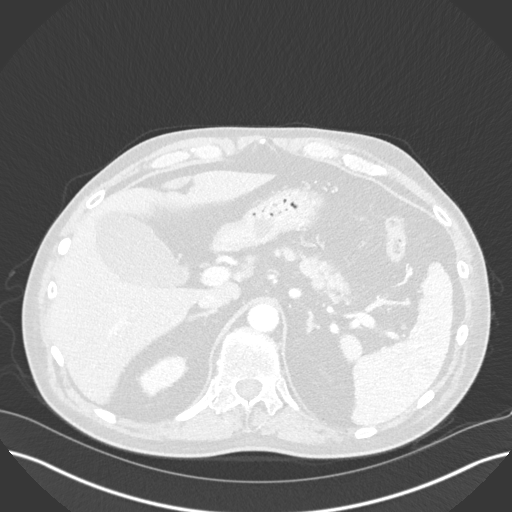
[im 24/156  lung]
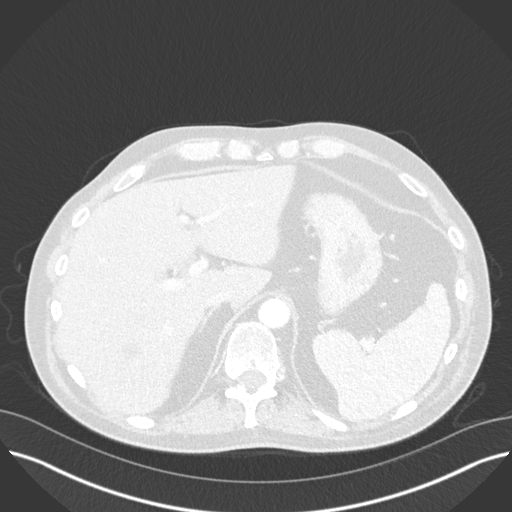
[im 36/156  lung]
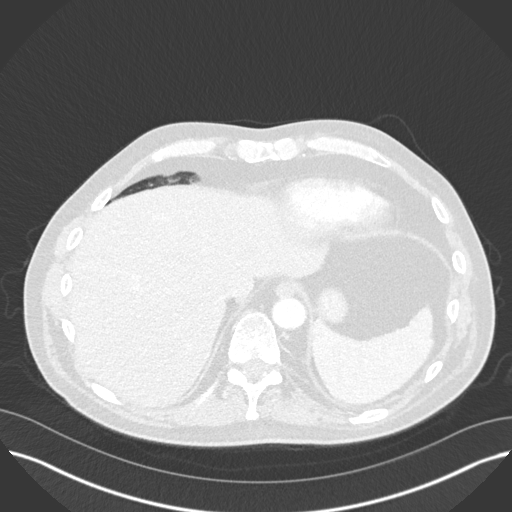
[im 48/156  lung]
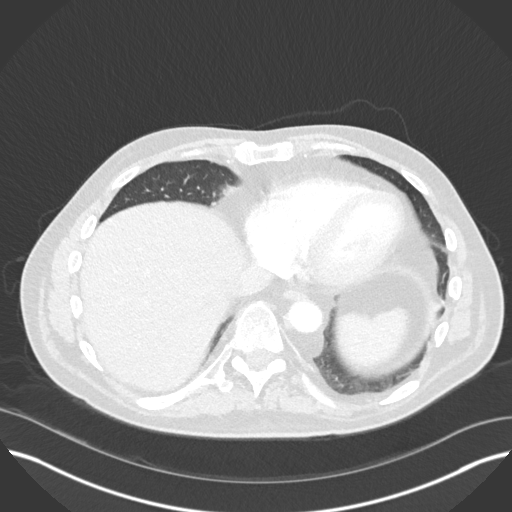
[im 60/156  mediastinal]
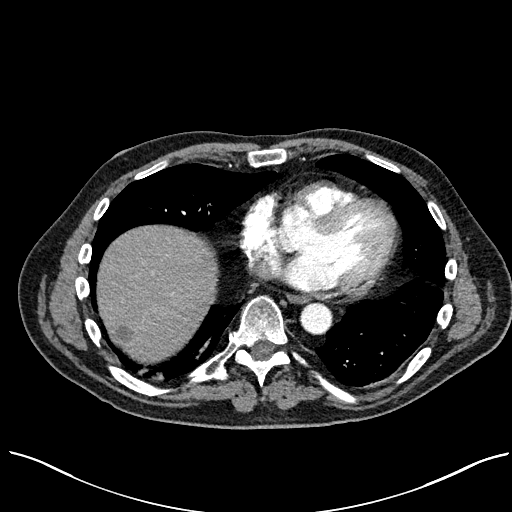
[im 60/156  lung]
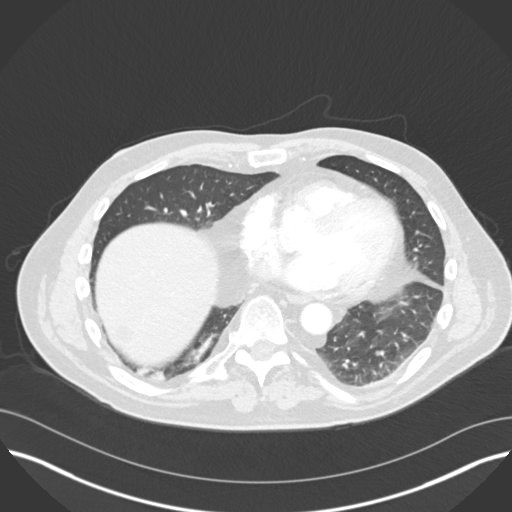
[im 72/156  lung]
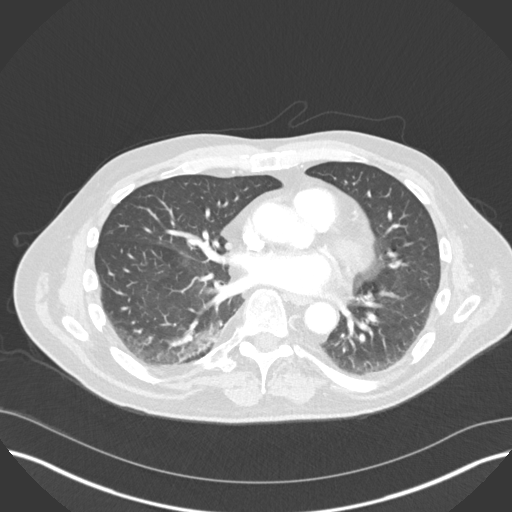
[im 84/156  lung]
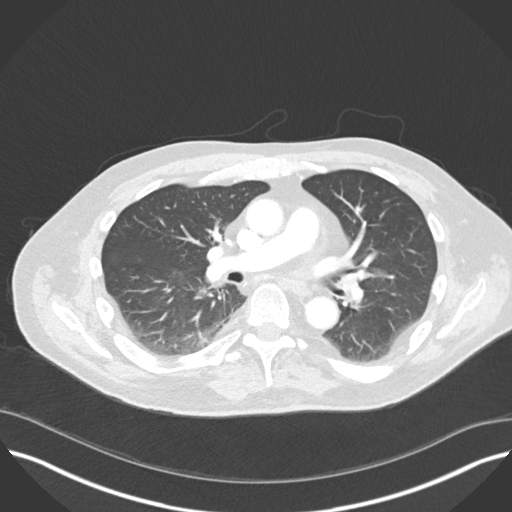
[im 96/156  lung]
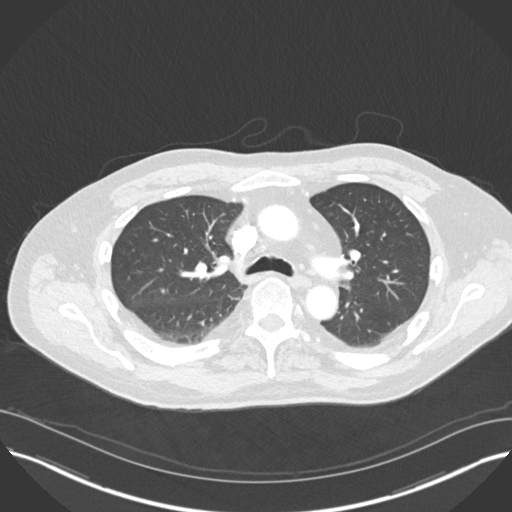
[im 108/156  mediastinal]
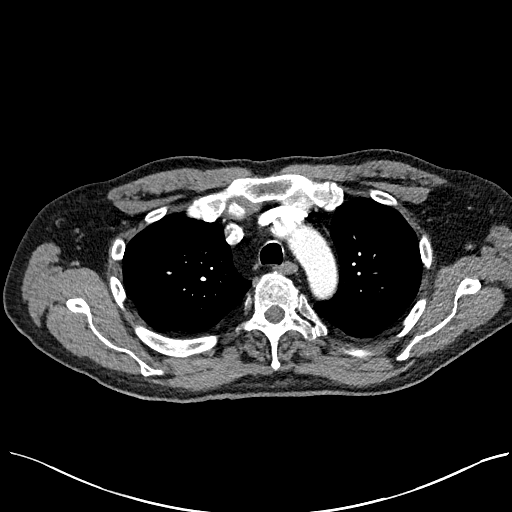
[im 108/156  lung]
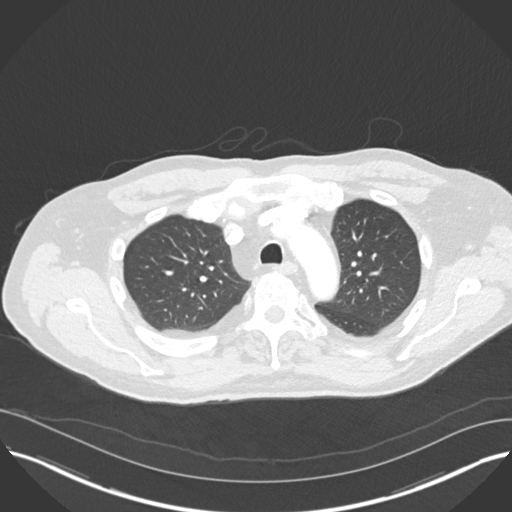
[im 120/156  lung]
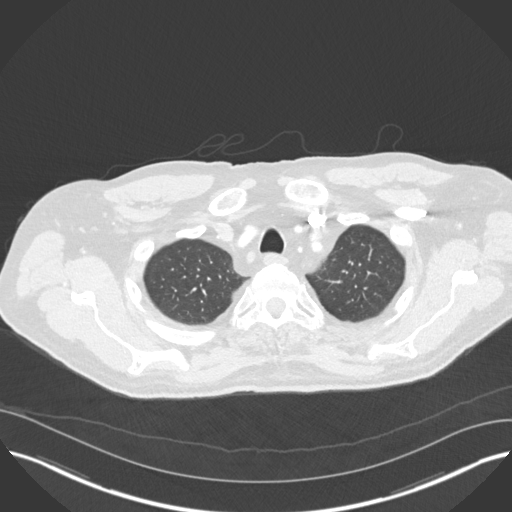
[im 132/156  lung]
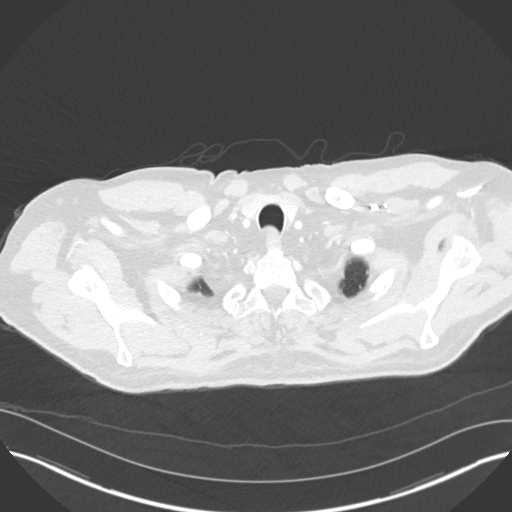
[im 144/156  lung]
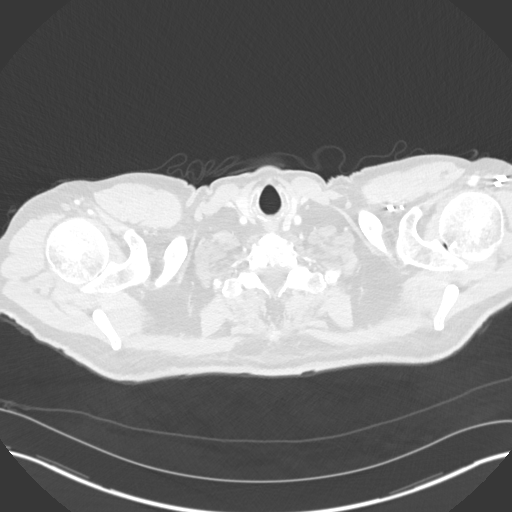

[Series 6: coronal · coronal · 0.64mm/px · 3 of 134 slices shown]
[im 27/134  lung]
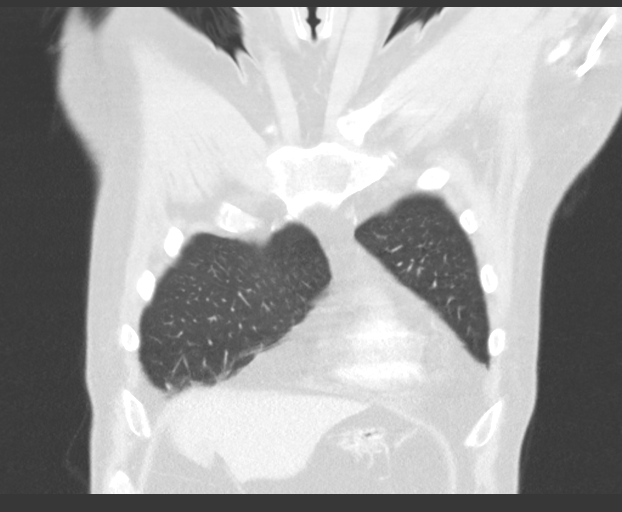
[im 54/134  lung]
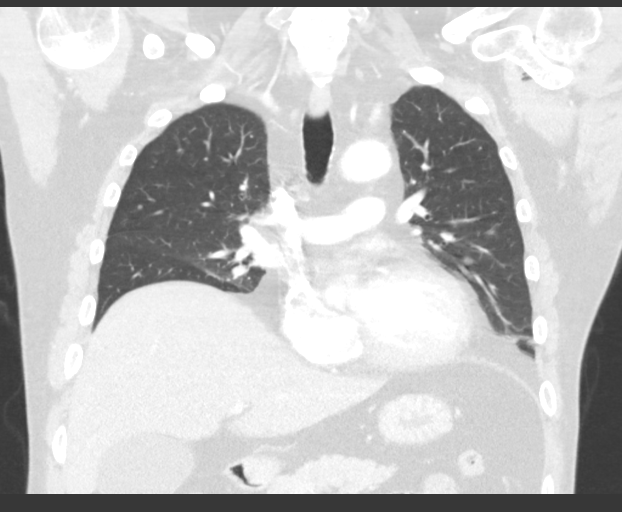
[im 80/134  lung]
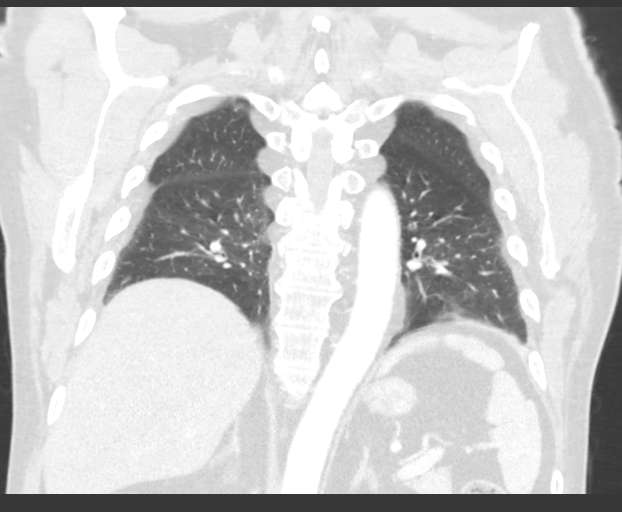

[15 of 36 positions shown; findings below may reference images not displayed]

FINDINGS: Cardiovascular: Heart is normal in size.  No pericardial effusion.

No evidence of thoracic aortic aneurysm.

Mediastinum/Nodes: No suspicious mediastinal lymphadenopathy.

Small right hilar nodes measuring up to 9 mm (series 2/image 70),
previously 8 mm, grossly unchanged.

Visualized thyroid is unremarkable.

Lungs/Pleura: Multiple bilateral pulmonary nodules/metastases,
including following index lesions:

--11 mm nodule in the anterior right upper lobe (series 5/image 66),
previously 12 mm

--4 mm nodule in the posterior right upper lobe (series 5/image 68),
previously 6 mm

--5 mm nodule in the right lower lobe (series 5/image 70),
previously 8 mm

Mild scarring/atelectasis at the right lung base. Additional
subpleural patchy opacity in the lingula and left lower lobe.

No focal consolidation.

No pleural effusion or pneumothorax.

Upper Abdomen: Multifocal hepatic metastases, improved, including:

--1.7 cm lesion in the posterior right hepatic dome (series 2/image
100), previously 2.6 cm

--1.3 cm lesion in the caudate (series 2/image 131), previously
cm

--1.7 cm lesion in the inferior right hepatic lobe (series 2/image
135), previously 2.2 cm

Musculoskeletal: No focal osseous lesions.
IMPRESSION: Improving pulmonary metastases, as above.

Small right hilar nodes, grossly unchanged.

Improving hepatic metastases, as above.

## 2020-12-08 MED ORDER — IOHEXOL 300 MG/ML  SOLN
75.0000 mL | Freq: Once | INTRAMUSCULAR | Status: AC | PRN
  Administered 2020-12-08: 75 mL via INTRAVENOUS

## 2020-12-08 NOTE — Telephone Encounter (Signed)
Patients appts already scheduled per 3/24 sch msg --   [facility] to confirm appts. - there was an answer and facility told me to call back and ask for the first floor. Called back and someone who was not staff answered.    Will call back at a later time.

## 2020-12-11 ENCOUNTER — Other Ambulatory Visit: Payer: Self-pay

## 2020-12-11 ENCOUNTER — Telehealth: Payer: Self-pay | Admitting: *Deleted

## 2020-12-11 ENCOUNTER — Inpatient Hospital Stay: Payer: 59 | Admitting: Oncology

## 2020-12-11 ENCOUNTER — Inpatient Hospital Stay: Payer: 59

## 2020-12-11 ENCOUNTER — Inpatient Hospital Stay: Payer: 59 | Admitting: Nutrition

## 2020-12-11 NOTE — Telephone Encounter (Signed)
Notified wife of improvement in lung and liver metastasis per Dr. [name]. She will pick up the handicap parking application later this week.

## 2020-12-11 NOTE — Telephone Encounter (Signed)
-----   Message from [name], MD sent at 12/09/2020  4:04 PM EDT ----- Please call family, CT shows improvement in liver and lung metastases, f/u as scheduled to discuss continuing nivolumab

## 2020-12-12 ENCOUNTER — Encounter: Payer: Self-pay | Admitting: *Deleted

## 2020-12-12 NOTE — Progress Notes (Signed)
[place] Work  Hol[employer] representative received a phone call from [place] case worker C. Carlota Raspberry.  Mr. Carlota Raspberry stated he would be closing the APS case as patients wife has been appointed temporary guardianship.  Mr. Carlota Raspberry reported observed improvement in patient at his last visit.  Romelle Starcher Booring is the appointed attorney handing the guardianship case.  The final guardianship hearing is scheduled for Jan 18, 2021.  CSW also spoke with patients wife.  Patients wife plans to contact the [place] to explore potential VA benefits.    Johnnye Lana, MSW, LCSW, OSW-C Clinical Social Worker [facility] [phone]

## 2020-12-13 ENCOUNTER — Ambulatory Visit
Admission: RE | Admit: 2020-12-13 | Discharge: 2020-12-13 | Disposition: A | Discharge status: Home / Self Care | Payer: 59 | Source: Ambulatory Visit | Attending: Internal Medicine | Admitting: Internal Medicine

## 2020-12-13 ENCOUNTER — Telehealth: Payer: Self-pay | Admitting: Oncology

## 2020-12-13 DIAGNOSIS — C7931 Secondary malignant neoplasm of brain: Secondary | ICD-10-CM

## 2020-12-13 IMAGING — MR MR HEAD WO/W CM
12 series · 48 of 48 positions shown · IV contrast (16 ML MULTIHANCE)
Comparison: None.

EXAM:
MRI HEAD WITHOUT AND WITH CONTRAST
TECHNIQUE: Multiplanar, multiecho pulse sequences of the brain and surrounding
structures were obtained without and with intravenous contrast.

CONTRAST:  16mL MULTIHANCE GADOBENATE DIMEGLUMINE 529 MG/ML IV SOLN

[Series 2: FLAIR · sagittal · 3.0mm · 0.75mm/px · 3 of 39 slices shown (1 of 2)]
[im 1/39]
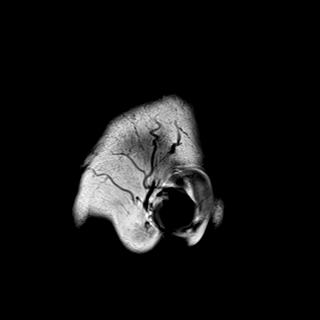
[im 20/39]
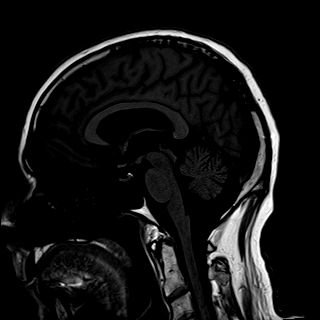
[im 39/39]
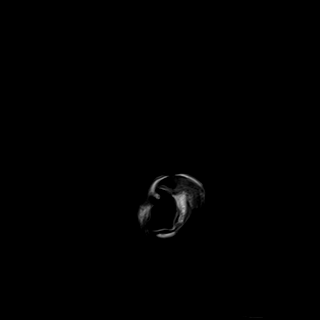

[Series 3: DWI · axial · 3.0mm · 1.50mm/px · z∈[-69,+78]mm · 4 of 78 slices shown (1 of 2)]
[im 1/78]
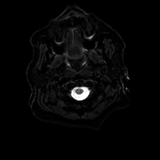
[im 26/78]
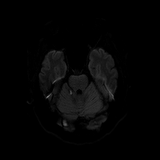
[im 52/78]
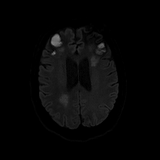
[im 78/78]
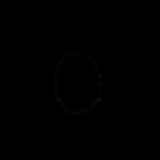

[Series 4: DWI · axial · 3.0mm · 1.50mm/px · z∈[-69,+78]mm · 2 of 39 slices shown (2 of 2)]
[im 1/39]
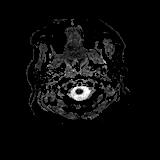
[im 39/39]
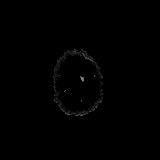

[Series 5: T2 · axial · 5.0mm · 0.57mm/px · 1 of 29 slices shown (1 of 2)]
[im 1/29]
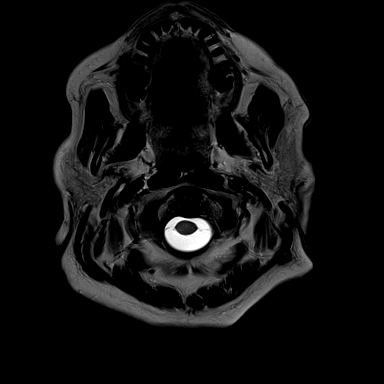

[Series 7: swi_images · axial · 1.5mm · 0.90mm/px · z∈[-65,+88]mm · 5 of 104 slices shown]
[im 1/104]
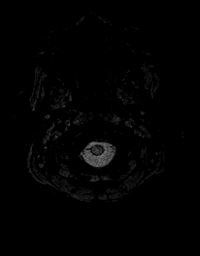
[im 26/104]
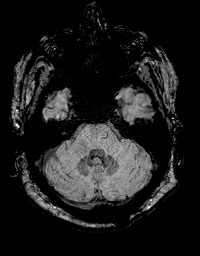
[im 52/104]
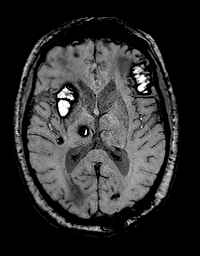
[im 78/104]
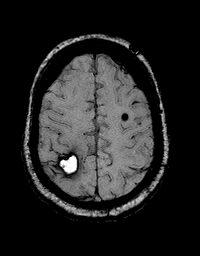
[im 104/104]
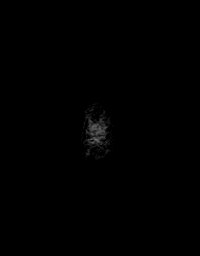

[Series 8: FLAIR · axial · 3.0mm · 0.86mm/px · z∈[-58,+107]mm · 3 of 56 slices shown (2 of 2)]
[im 1/56]
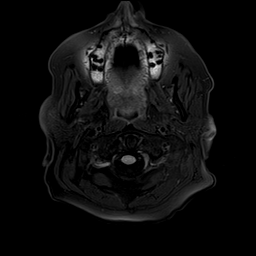
[im 28/56]
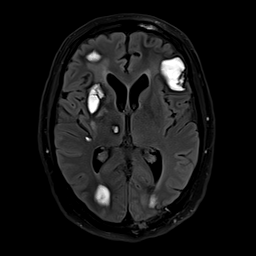
[im 56/56]
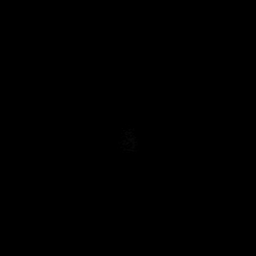

[Series 9: T2 · axial · non-contrast · 1.0mm · 0.86mm/px · z∈[-54,+102]mm · 8 of 160 slices shown (2 of 2)]
[im 1/160]
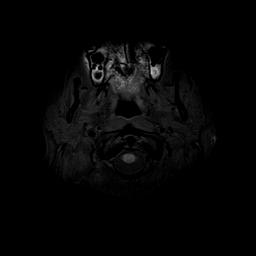
[im 23/160]
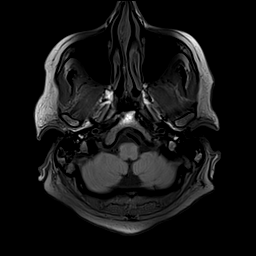
[im 46/160]
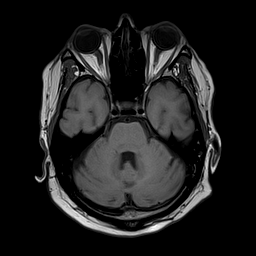
[im 69/160]
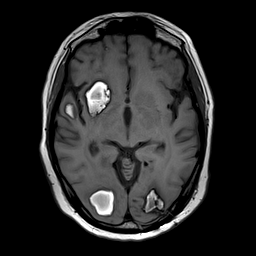
[im 91/160]
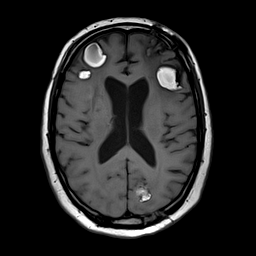
[im 114/160]
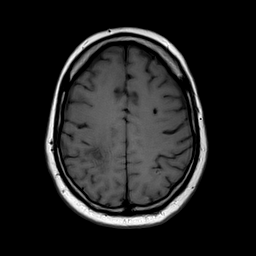
[im 137/160]
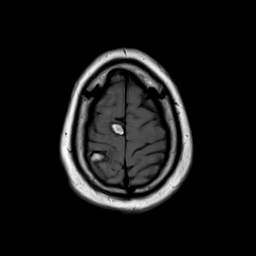
[im 160/160]
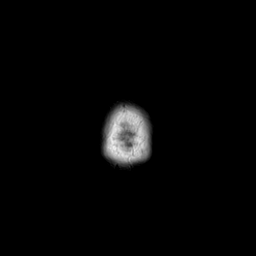

[Series 10: T2 post-contrast · coronal · 3.0mm · 0.57mm/px · 2 of 50 slices shown (1 of 2)]
[im 1/50]
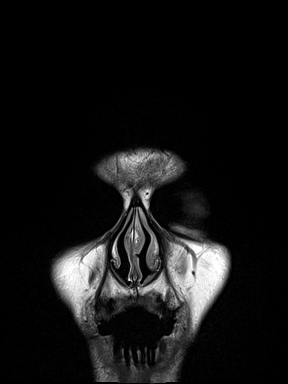
[im 50/50]
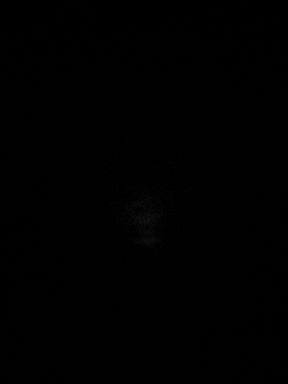

[Series 11: T2 post-contrast · axial · 1.0mm · 0.86mm/px · z∈[-54,+102]mm · 8 of 160 slices shown (2 of 2)]
[im 1/160]
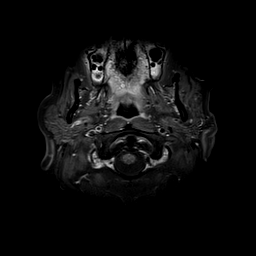
[im 23/160]
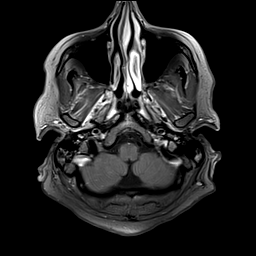
[im 46/160]
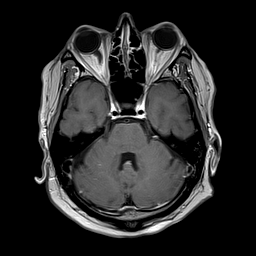
[im 69/160]
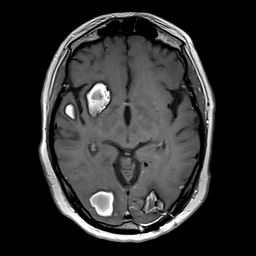
[im 91/160]
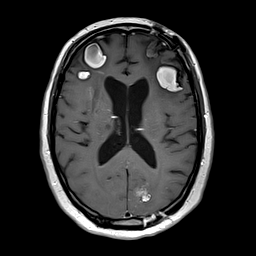
[im 114/160]
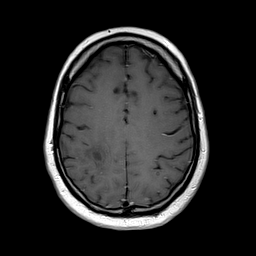
[im 137/160]
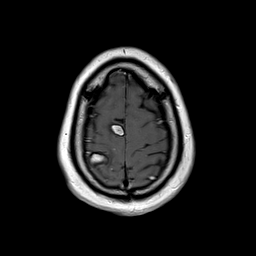
[im 160/160]
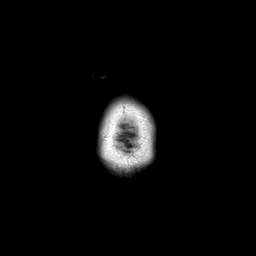

[Series 12: T1 post-contrast · axial · 1.0mm · 0.75mm/px · z∈[-55,+104]mm · 8 of 160 slices shown (1 of 2)]
[im 1/160]
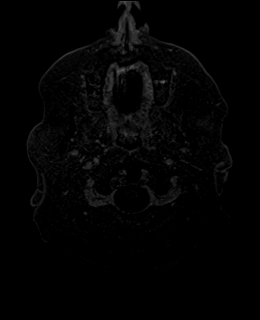
[im 23/160]
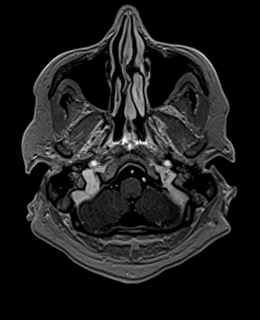
[im 46/160]
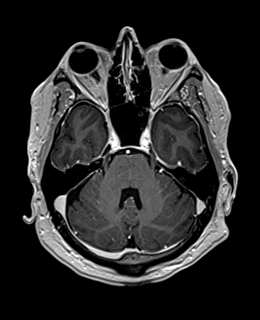
[im 69/160]
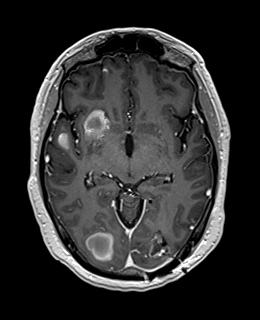
[im 91/160]
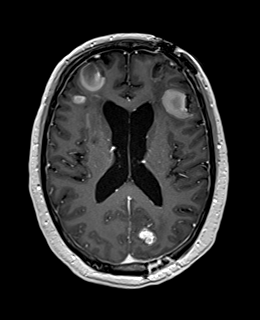
[im 114/160]
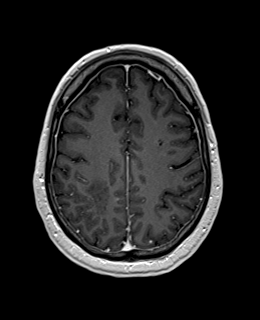
[im 137/160]
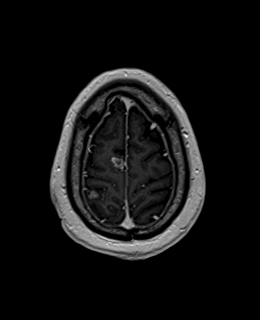
[im 160/160]
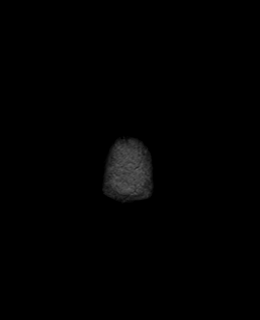

[Series 13: T1 post-contrast · coronal · 3.0mm · 0.57mm/px · 2 of 50 slices shown (2 of 2)]
[im 1/50]
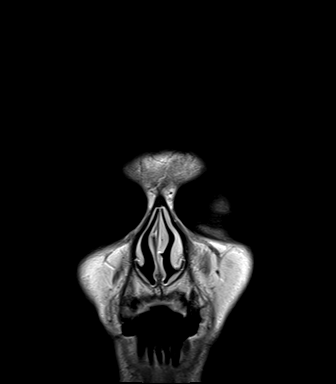
[im 50/50]
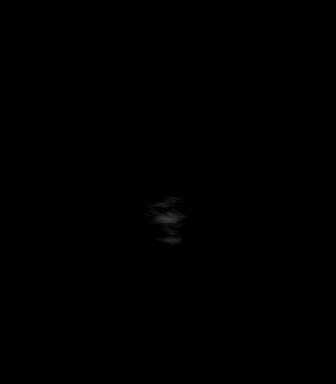

[Series 14: FLAIR post-contrast · sagittal · 3.0mm · 0.75mm/px · 2 of 39 slices shown]
[im 1/39]
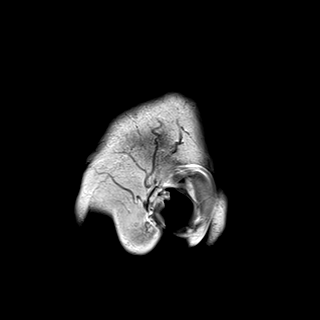
[im 39/39]
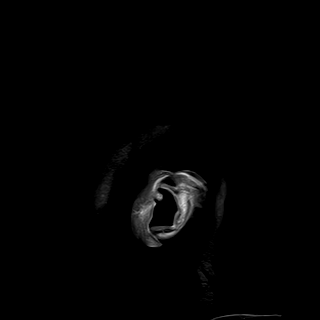

[48 of 48 positions shown; findings below may reference images not displayed]

FINDINGS: Brain: Interval slight decrease in size of numerous intrinsically T1
hyperintense lesions previously seen, likely largely secondary to
resolving/evolving hemorrhage given signal characteristics. These
lesions also demonstrate susceptibility artifact, compatible with
hemorrhage. The T1 hyperintensity of these lesions limits evaluation
for underlying enhancement. The T2/FLAIR hyperintense vasogenic
edema surrounding the lesions is also slightly improved with
decreased mass effect on the lateral ventricles.

Index lesion in the left frontal lobe measures 2.8 x 2.2 by 2.1 cm
(series 12, image 85), previously 3.4 x 2.7 x 2.4 cm. Index lesion
in the right basal ganglia measures 3.0 x 2.0 x 2.4 cm (series 12,
image 95), previously 3.5 by 2.6 x 3.1 cm. Index lesion within the
right occipital lobe measures 1.9 by 2.3 by 2.6 cm (series 12, image
75), previously 2.2 x 2.4 x 2.8 cm.

Multiple new irregular/wispy areas of nonhemorrhagic enhancement in
the left frontal and right parietal white matter (for example series
12, image 95 through 101 and images 105 through 109) and right
These areas do demonstrate DWI hyperintensity, but there is no
convincing ADC correlate, therefore favored to represent T2 shine
through.

Similar postsurgical changes in the left occipital and left frontal
lobes.

No evidence of acute infarct. No hydrocephalus. No midline shift.
Basal cisterns are patent. No extra-axial fluid collection.

Vascular: Major arterial flow voids are maintained at the skull
base.

Skull and upper cervical spine: Prior left occipital and left
frontal craniotomies.

Sinuses/Orbits: Clear sinuses.  Unremarkable orbits.

Other: No mastoid effusions.
IMPRESSION: 1. Interval slight decrease in size of multiple hemorrhagic
intracranial metastases previously seen, detailed above and likely
largely secondary to resolving/evolving hemorrhage. Surrounding
vasogenic edema is also slightly improved with decreased mass effect
on the lateral ventricles.
2. Multiple new areas of nonhemorrhagic enhancement in bifrontal and
right parietal white matter. These areas could represent new
metastases, but the appearance of the irregular/wispy enhancement is
nonspecific and inflammatory/treatment related change is a
differential consideration. Consider short-interval followup to
assess for change.

## 2020-12-13 MED ORDER — GADOBENATE DIMEGLUMINE 529 MG/ML IV SOLN
16.0000 mL | Freq: Once | INTRAVENOUS | Status: AC | PRN
  Administered 2020-12-13: 16 mL via INTRAVENOUS

## 2020-12-13 NOTE — Telephone Encounter (Signed)
Sean [name] with transportation at [facility]. She is aware of appt date and time to bring patient to appt.

## 2020-12-14 ENCOUNTER — Telehealth: Payer: Self-pay

## 2020-12-14 NOTE — Telephone Encounter (Signed)
Spoke with patient's father regarding results of CT results from last week.  Per Dr. [name] I have let him know that it shows some improvement in the pulmonary and liver lesions.  Will discuss further at his next appointment.  He verbalized an understanding.

## 2020-12-15 ENCOUNTER — Telehealth: Payer: Self-pay

## 2020-12-15 NOTE — Telephone Encounter (Signed)
Faxed signed orders for [facility] to [phone]

## 2020-12-18 ENCOUNTER — Telehealth: Payer: Self-pay | Admitting: Internal Medicine

## 2020-12-18 ENCOUNTER — Other Ambulatory Visit: Payer: 59

## 2020-12-18 ENCOUNTER — Inpatient Hospital Stay (HOSPITAL_BASED_OUTPATIENT_CLINIC_OR_DEPARTMENT_OTHER)
Admission: EM | Admit: 2020-12-18 | Discharge: 2021-01-03 | DRG: 871 | Disposition: A | Discharge status: Skilled Nursing Facility | Payer: 59 | Source: Ambulatory Visit | Attending: Internal Medicine | Admitting: Internal Medicine

## 2020-12-18 ENCOUNTER — Inpatient Hospital Stay: Payer: 59 | Attending: Oncology | Admitting: Oncology

## 2020-12-18 ENCOUNTER — Telehealth: Payer: Self-pay | Admitting: *Deleted

## 2020-12-18 ENCOUNTER — Inpatient Hospital Stay: Payer: 59

## 2020-12-18 ENCOUNTER — Other Ambulatory Visit: Payer: Self-pay

## 2020-12-18 ENCOUNTER — Ambulatory Visit: Payer: 59

## 2020-12-18 ENCOUNTER — Emergency Department (HOSPITAL_BASED_OUTPATIENT_CLINIC_OR_DEPARTMENT_OTHER): Payer: 59

## 2020-12-18 ENCOUNTER — Encounter (HOSPITAL_BASED_OUTPATIENT_CLINIC_OR_DEPARTMENT_OTHER): Payer: Self-pay | Admitting: *Deleted

## 2020-12-18 ENCOUNTER — Telehealth: Payer: Self-pay

## 2020-12-18 VITALS — BP 115/84 | HR 107 | Temp 101.5°F | Wt 170.6 lb

## 2020-12-18 VITALS — BP 103/88 | HR 113 | Temp 98.8°F | Resp 18 | Ht 71.0 in

## 2020-12-18 DIAGNOSIS — J189 Pneumonia, unspecified organism: Secondary | ICD-10-CM | POA: Diagnosis present

## 2020-12-18 DIAGNOSIS — Z20822 Contact with and (suspected) exposure to covid-19: Secondary | ICD-10-CM | POA: Diagnosis present

## 2020-12-18 DIAGNOSIS — R7989 Other specified abnormal findings of blood chemistry: Secondary | ICD-10-CM | POA: Diagnosis present

## 2020-12-18 DIAGNOSIS — C439 Malignant melanoma of skin, unspecified: Secondary | ICD-10-CM | POA: Diagnosis present

## 2020-12-18 DIAGNOSIS — E785 Hyperlipidemia, unspecified: Secondary | ICD-10-CM | POA: Diagnosis not present

## 2020-12-18 DIAGNOSIS — E876 Hypokalemia: Secondary | ICD-10-CM | POA: Diagnosis not present

## 2020-12-18 DIAGNOSIS — Z79899 Other long term (current) drug therapy: Secondary | ICD-10-CM | POA: Insufficient documentation

## 2020-12-18 DIAGNOSIS — Z66 Do not resuscitate: Secondary | ICD-10-CM | POA: Diagnosis not present

## 2020-12-18 DIAGNOSIS — E872 Acidosis: Secondary | ICD-10-CM | POA: Diagnosis present

## 2020-12-18 DIAGNOSIS — C7800 Secondary malignant neoplasm of unspecified lung: Secondary | ICD-10-CM | POA: Insufficient documentation

## 2020-12-18 DIAGNOSIS — E870 Hyperosmolality and hypernatremia: Secondary | ICD-10-CM | POA: Diagnosis not present

## 2020-12-18 DIAGNOSIS — Z87891 Personal history of nicotine dependence: Secondary | ICD-10-CM

## 2020-12-18 DIAGNOSIS — R509 Fever, unspecified: Secondary | ICD-10-CM

## 2020-12-18 DIAGNOSIS — R6520 Severe sepsis without septic shock: Secondary | ICD-10-CM | POA: Diagnosis not present

## 2020-12-18 DIAGNOSIS — Z5112 Encounter for antineoplastic immunotherapy: Secondary | ICD-10-CM | POA: Insufficient documentation

## 2020-12-18 DIAGNOSIS — Z6823 Body mass index (BMI) 23.0-23.9, adult: Secondary | ICD-10-CM

## 2020-12-18 DIAGNOSIS — C787 Secondary malignant neoplasm of liver and intrahepatic bile duct: Secondary | ICD-10-CM | POA: Diagnosis not present

## 2020-12-18 DIAGNOSIS — G40909 Epilepsy, unspecified, not intractable, without status epilepticus: Secondary | ICD-10-CM | POA: Diagnosis present

## 2020-12-18 DIAGNOSIS — I959 Hypotension, unspecified: Secondary | ICD-10-CM | POA: Diagnosis not present

## 2020-12-18 DIAGNOSIS — E440 Moderate protein-calorie malnutrition: Secondary | ICD-10-CM | POA: Diagnosis not present

## 2020-12-18 DIAGNOSIS — N179 Acute kidney failure, unspecified: Secondary | ICD-10-CM | POA: Diagnosis present

## 2020-12-18 DIAGNOSIS — R748 Abnormal levels of other serum enzymes: Secondary | ICD-10-CM | POA: Insufficient documentation

## 2020-12-18 DIAGNOSIS — A419 Sepsis, unspecified organism: Secondary | ICD-10-CM | POA: Diagnosis not present

## 2020-12-18 DIAGNOSIS — R451 Restlessness and agitation: Secondary | ICD-10-CM | POA: Diagnosis not present

## 2020-12-18 DIAGNOSIS — N009 Acute nephritic syndrome with unspecified morphologic changes: Secondary | ICD-10-CM | POA: Diagnosis present

## 2020-12-18 DIAGNOSIS — C7931 Secondary malignant neoplasm of brain: Secondary | ICD-10-CM | POA: Insufficient documentation

## 2020-12-18 DIAGNOSIS — R569 Unspecified convulsions: Secondary | ICD-10-CM

## 2020-12-18 DIAGNOSIS — N10 Acute tubulo-interstitial nephritis: Secondary | ICD-10-CM

## 2020-12-18 LAB — CMP (CANCER CENTER ONLY)
ALT: 131 U/L — ABNORMAL HIGH (ref 0–44)
AST: 53 U/L — ABNORMAL HIGH (ref 15–41)
Albumin: 3.8 g/dL (ref 3.5–5.0)
Alkaline Phosphatase: 118 U/L (ref 38–126)
Anion gap: 12 (ref 5–15)
BUN: 11 mg/dL (ref 6–20)
CO2: 24 mmol/L (ref 22–32)
Calcium: 8.9 mg/dL (ref 8.9–10.3)
Chloride: 107 mmol/L (ref 98–111)
Creatinine: 1.01 mg/dL (ref 0.61–1.24)
GFR, Estimated: >60 mL/min (ref >60)
Glucose, Bld: 126 mg/dL — ABNORMAL HIGH (ref 70–99)
Potassium: 3.5 mmol/L (ref 3.5–5.1)
Sodium: 143 mmol/L (ref 135–145)
Total Bilirubin: 1.6 mg/dL — ABNORMAL HIGH (ref 0.3–1.2)
Total Protein: 6.4 g/dL — ABNORMAL LOW (ref 6.5–8.1)

## 2020-12-18 LAB — LACTIC ACID, PLASMA
Lactic Acid, Venous: 2.4 mmol/L (ref 0.5–1.9)
Lactic Acid, Venous: 2.7 mmol/L (ref 0.5–1.9)

## 2020-12-18 LAB — RESP PANEL BY RT-PCR (FLU A&B, COVID) ARPGX2
Influenza A by PCR: NEGATIVE
Influenza B by PCR: NEGATIVE
SARS Coronavirus 2 by RT PCR: NEGATIVE

## 2020-12-18 LAB — COMPREHENSIVE METABOLIC PANEL
ALT: 138 U/L — ABNORMAL HIGH (ref 0–44)
AST: 61 U/L — ABNORMAL HIGH (ref 15–41)
Albumin: 3.7 g/dL (ref 3.5–5.0)
Alkaline Phosphatase: 123 U/L (ref 38–126)
Anion gap: 11 (ref 5–15)
BUN: 14 mg/dL (ref 6–20)
CO2: 27 mmol/L (ref 22–32)
Calcium: 8.9 mg/dL (ref 8.9–10.3)
Chloride: 106 mmol/L (ref 98–111)
Creatinine, Ser: 1.23 mg/dL (ref 0.61–1.24)
GFR, Estimated: >60 mL/min (ref >60)
Glucose, Bld: 116 mg/dL — ABNORMAL HIGH (ref 70–99)
Potassium: 3.8 mmol/L (ref 3.5–5.1)
Sodium: 144 mmol/L (ref 135–145)
Total Bilirubin: 1.5 mg/dL — ABNORMAL HIGH (ref 0.3–1.2)
Total Protein: 6.4 g/dL — ABNORMAL LOW (ref 6.5–8.1)

## 2020-12-18 LAB — CBC WITH DIFFERENTIAL/PLATELET
Abs Immature Granulocytes: 0.15 10*3/uL — ABNORMAL HIGH (ref 0.00–0.07)
Basophils Absolute: 0 10*3/uL (ref 0.0–0.1)
Basophils Relative: 0 %
Eosinophils Absolute: 0.1 10*3/uL (ref 0.0–0.5)
Eosinophils Relative: 1 %
HCT: 41.4 % (ref 39.0–52.0)
Hemoglobin: 13.9 g/dL (ref 13.0–17.0)
Immature Granulocytes: 2 %
Lymphocytes Relative: 8 %
Lymphs Abs: 0.8 10*3/uL (ref 0.7–4.0)
MCH: 31.2 pg (ref 26.0–34.0)
MCHC: 33.6 g/dL (ref 30.0–36.0)
MCV: 93 fL (ref 80.0–100.0)
Monocytes Absolute: 1.1 10*3/uL — ABNORMAL HIGH (ref 0.1–1.0)
Monocytes Relative: 12 %
Neutro Abs: 7.6 10*3/uL (ref 1.7–7.7)
Neutrophils Relative %: 77 %
Platelets: 237 10*3/uL (ref 150–400)
RBC: 4.45 MIL/uL (ref 4.22–5.81)
RDW: 14.1 % (ref 11.5–15.5)
WBC: 9.8 10*3/uL (ref 4.0–10.5)
nRBC: 0 % (ref 0.0–0.2)

## 2020-12-18 LAB — TSH: TSH: 1.532 u[IU]/mL (ref 0.350–4.500)

## 2020-12-18 IMAGING — DX DG CHEST 1V PORT
1 series · 1 of 1 positions shown · non-contrast
Comparison: None.

CLINICAL DATA: Fever

EXAM:
PORTABLE CHEST 1 VIEW

[chest]
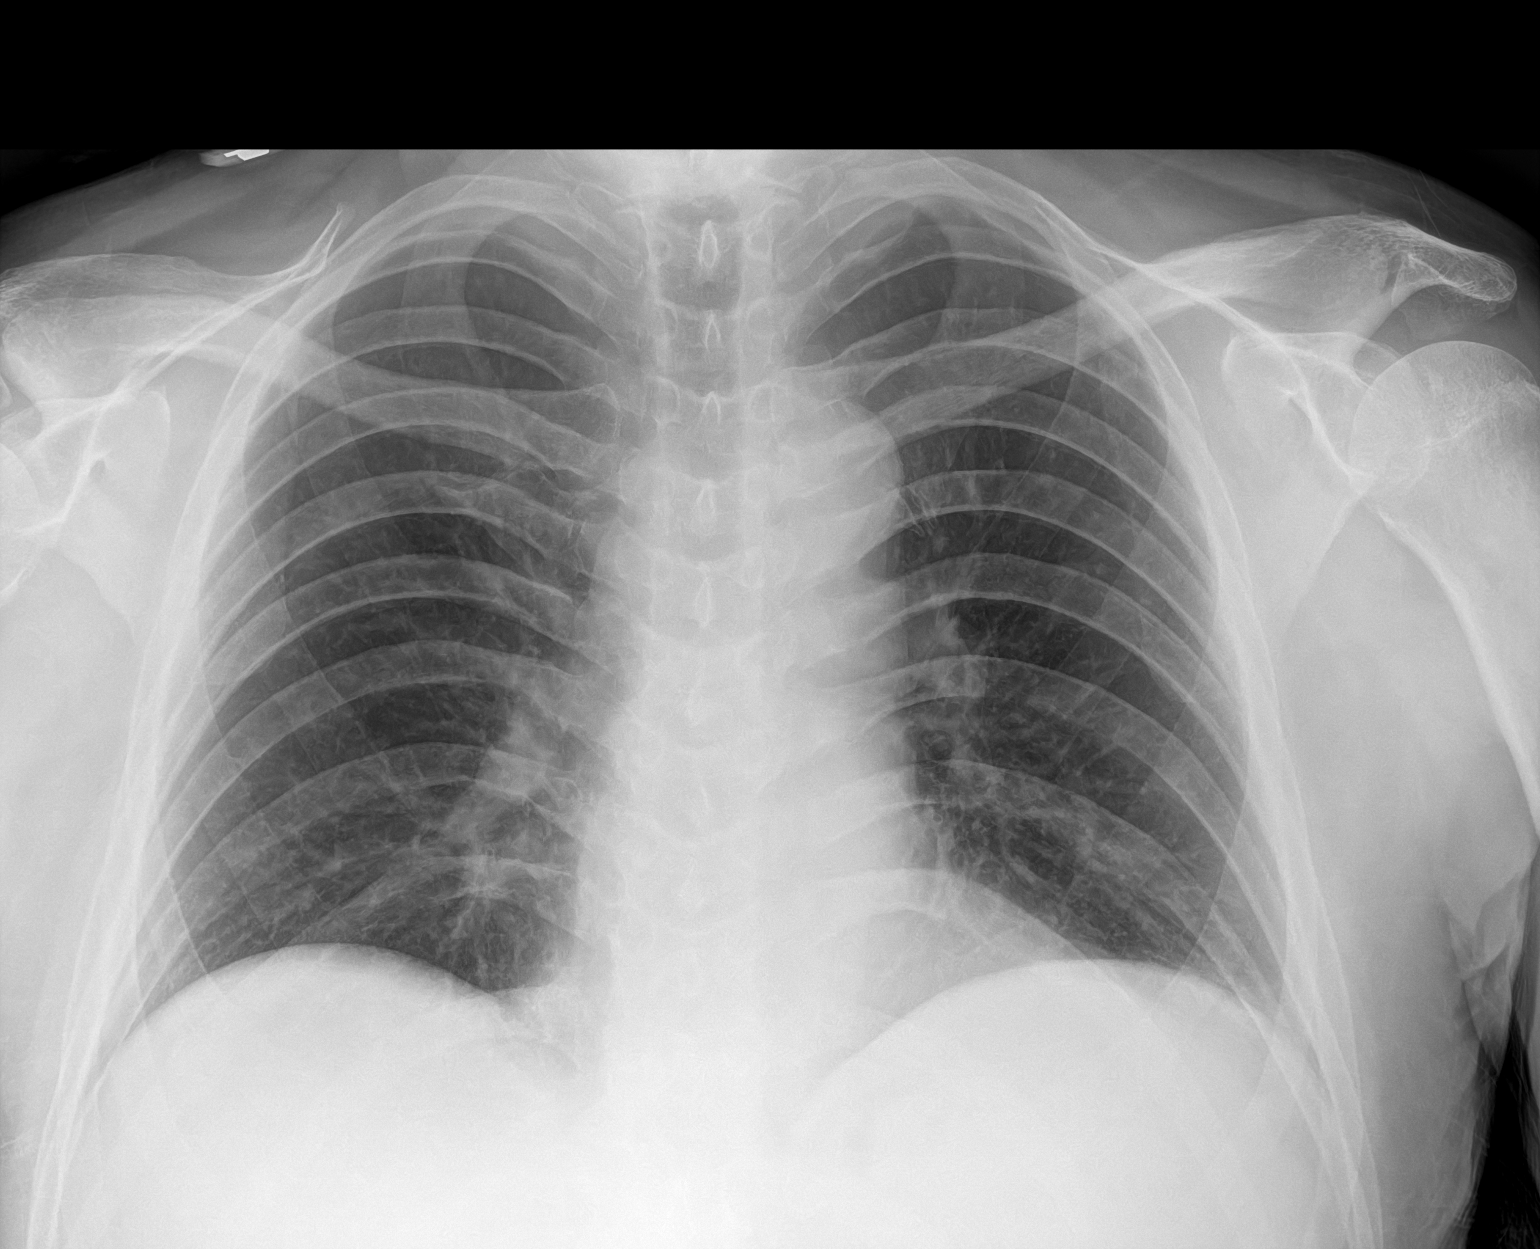

[1 of 1 positions shown; findings below may reference images not displayed]

FINDINGS: The heart size and mediastinal contours are within normal limits.
Mildly increased hazy airspace opacity is seen the left lower lung.
The visualized skeletal structures are unremarkable.
IMPRESSION: Mildly hazy airspace opacity at the left lower lung which could be
due to atelectasis or infectious etiology.

## 2020-12-18 IMAGING — CT CT HEAD W/O CM
2 series · 15 of 30 positions shown, 17 images · non-contrast
Comparison: [DATE]

CLINICAL DATA: Fever.  History of metastatic melanoma to the brain.

EXAM:
CT HEAD WITHOUT CONTRAST
TECHNIQUE: Contiguous axial images were obtained from the base of the skull
through the vertex without intravenous contrast.

[Series 2: head wo · axial · 0.40mm/px · z∈[-532,-432]mm · 7 of 28 slices shown, 9 images]
[im 4/28  brain]
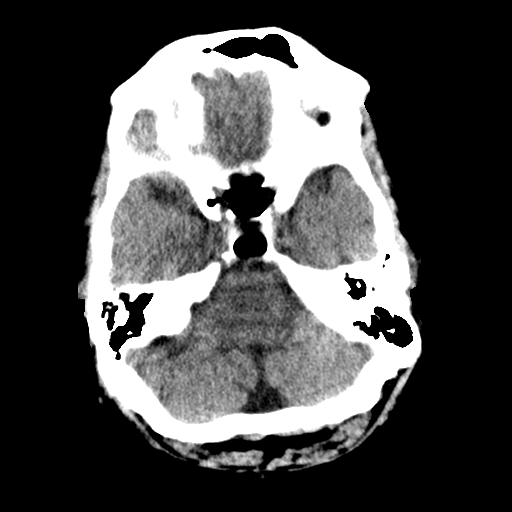
[im 4/28  bone]
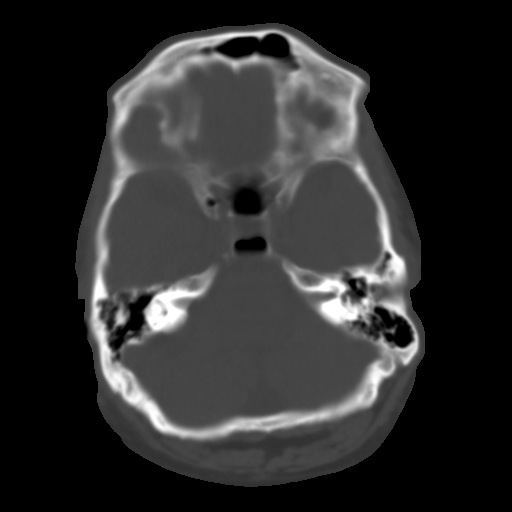
[im 7/28  brain]
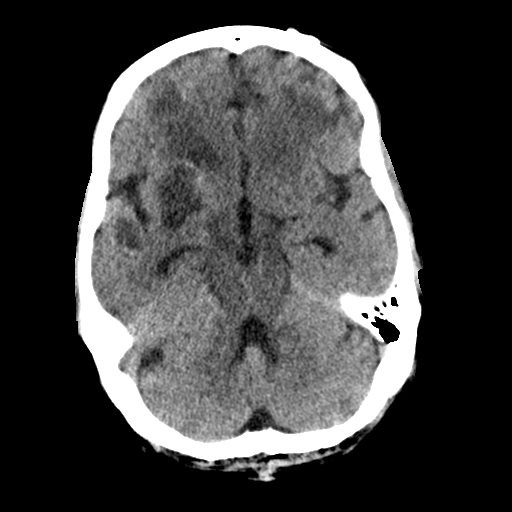
[im 11/28  brain]
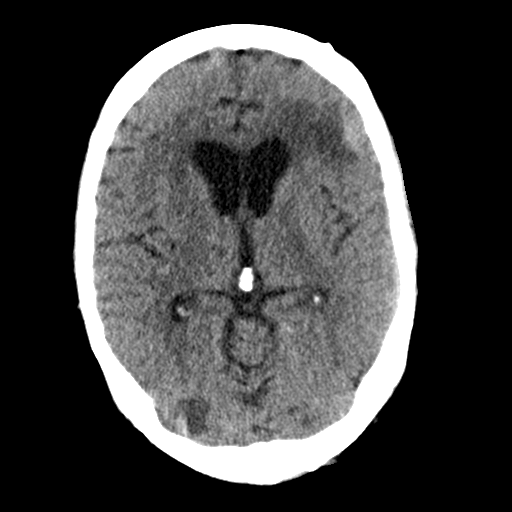
[im 14/28  brain]
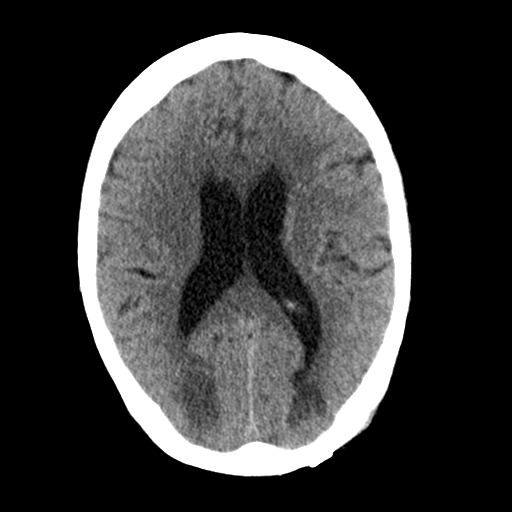
[im 17/28  brain]
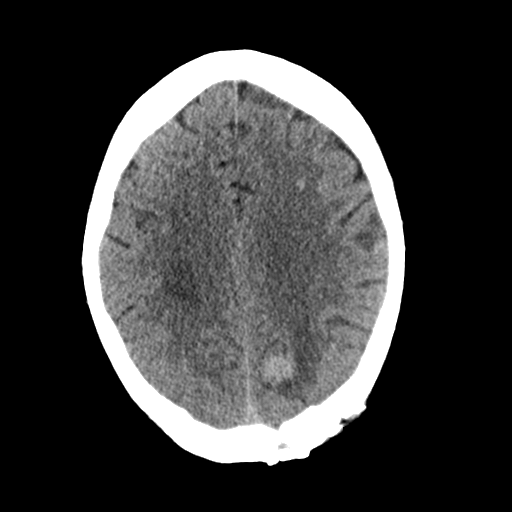
[im 17/28  bone]
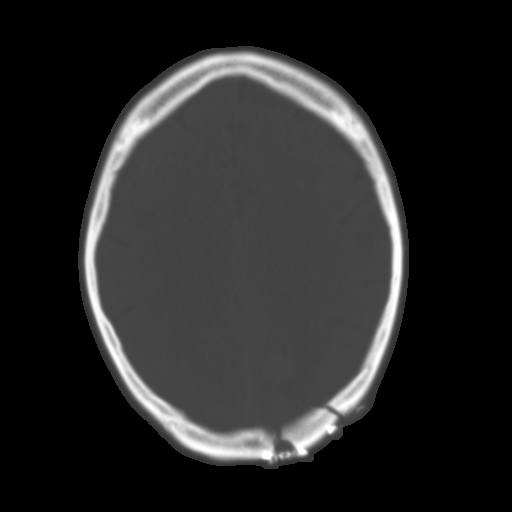
[im 21/28  brain]
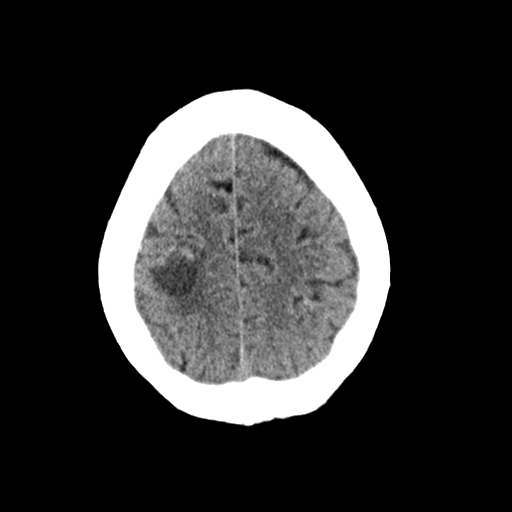
[im 24/28  brain]
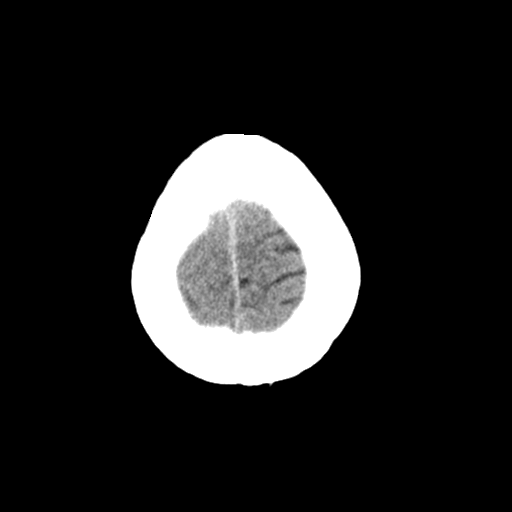

[Series 3: head bone · axial · 0.40mm/px · z∈[-535,-423]mm · 8 of 70 slices shown]
[im 7/70  bone]
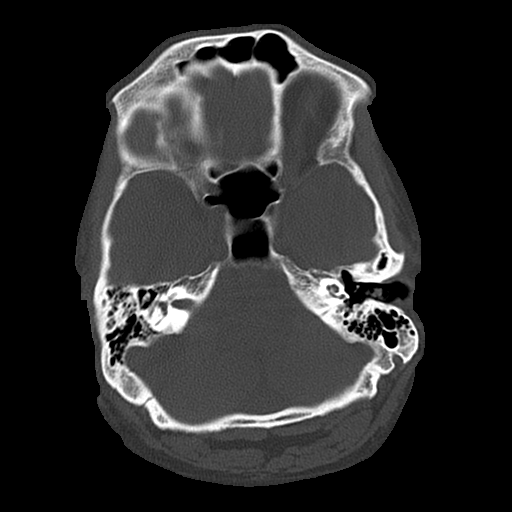
[im 14/70  bone]
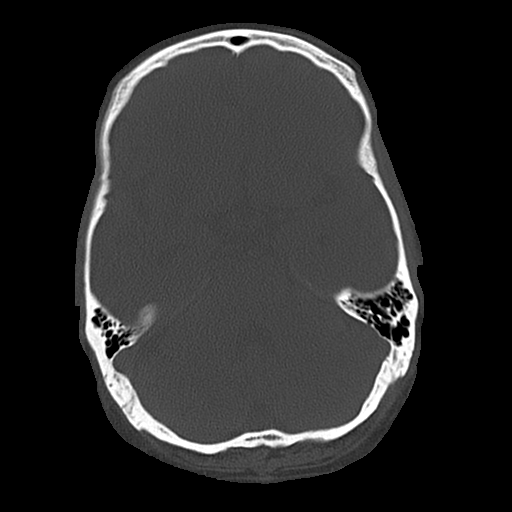
[im 21/70  bone]
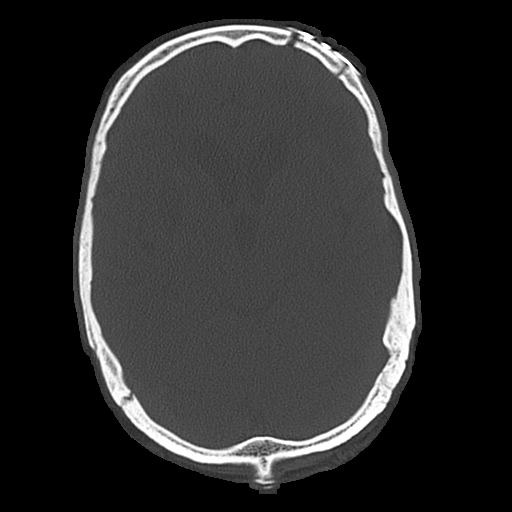
[im 32/70  bone]
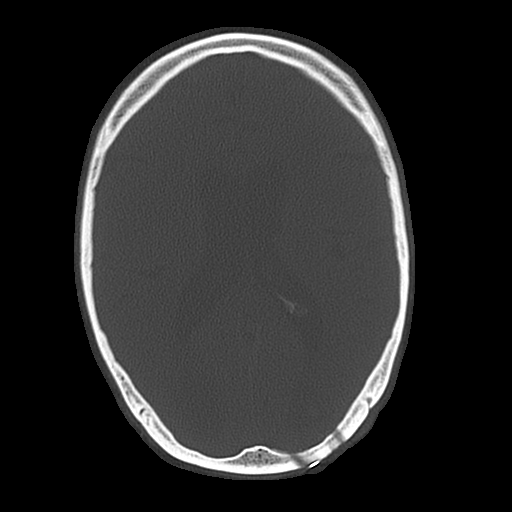
[im 38/70  bone]
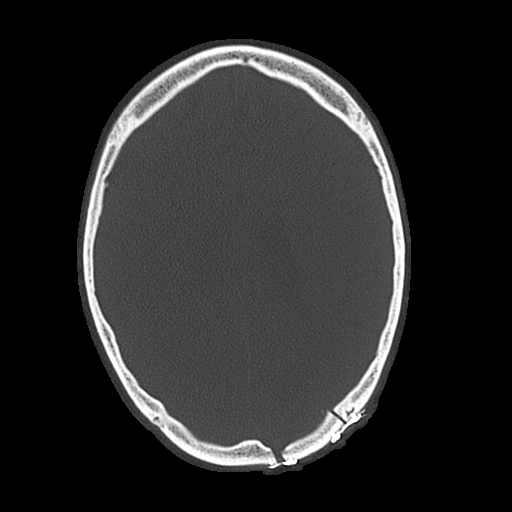
[im 49/70  bone]
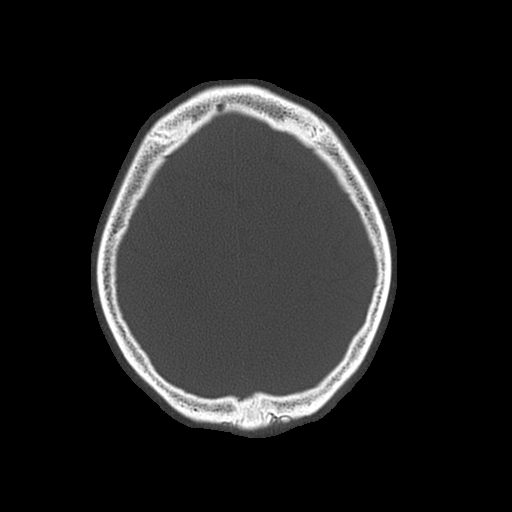
[im 56/70  bone]
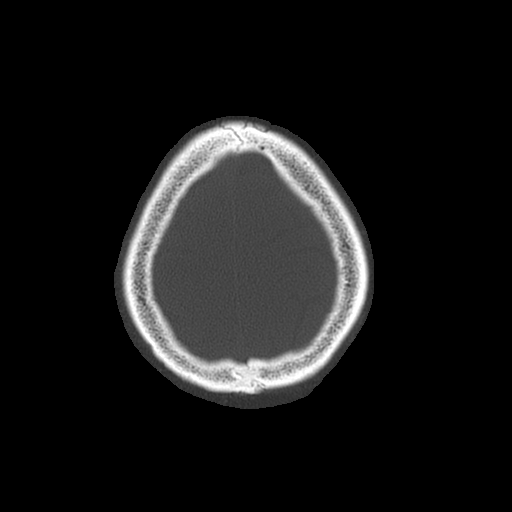
[im 63/70  bone]
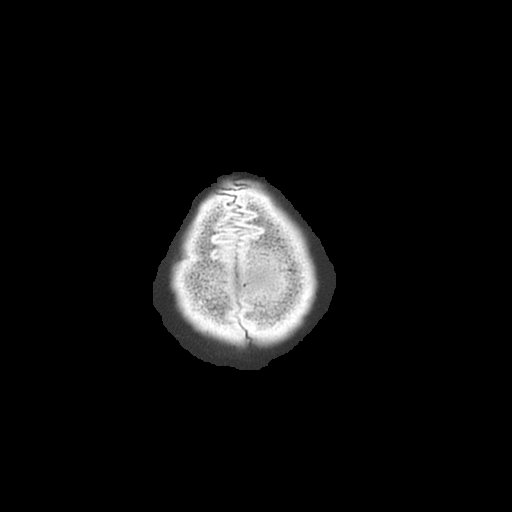

[15 of 30 positions shown; findings below may reference images not displayed]

FINDINGS: Brain: Hyperdense lesion again noted in the posterior left parietal
lobe measuring 1.5 cm, stable since prior study. Numerous other
hyperdense metastases noted. Encephalomalacia within the previously
seen area of hemorrhage in the right basal ganglia. No new areas of
hemorrhage, mass effect or midline shift. No hydrocephalus.

Vascular: No hyperdense vessel or unexpected calcification.

Skull: No acute calvarial abnormality.

Sinuses/Orbits: No acute findings

Other: None
IMPRESSION: Extensive hemorrhagic metastases throughout the brain compatible
with metastatic melanoma. No significant change since prior study.

## 2020-12-18 MED ORDER — LACTATED RINGERS IV BOLUS (SEPSIS)
500.0000 mL | Freq: Once | INTRAVENOUS | Status: AC
  Administered 2020-12-18: 500 mL via INTRAVENOUS

## 2020-12-18 MED ORDER — SODIUM CHLORIDE 0.9 % IV SOLN
240.0000 mg | Freq: Once | INTRAVENOUS | Status: AC
  Administered 2020-12-18: 240 mg via INTRAVENOUS
  Filled 2020-12-18: qty 24

## 2020-12-18 MED ORDER — SODIUM CHLORIDE 0.9 % IV SOLN: 500.0000 mg | Freq: Once | INTRAVENOUS | Status: DC

## 2020-12-18 MED ORDER — SODIUM CHLORIDE 0.9 % IV SOLN
Freq: Once | INTRAVENOUS | Status: AC
  Filled 2020-12-18: qty 250

## 2020-12-18 MED ORDER — SODIUM CHLORIDE 0.9 % IV SOLN
1.0000 g | Freq: Once | INTRAVENOUS | Status: AC
  Administered 2020-12-18: 1 g via INTRAVENOUS
  Filled 2020-12-18: qty 10

## 2020-12-18 MED ORDER — ACETAMINOPHEN 325 MG PO TABS
650.0000 mg | ORAL_TABLET | Freq: Once | ORAL | Status: AC
  Administered 2020-12-18: 650 mg via ORAL

## 2020-12-18 MED ORDER — LACTATED RINGERS IV BOLUS (SEPSIS)
1000.0000 mL | Freq: Once | INTRAVENOUS | Status: AC
  Administered 2020-12-18: 1000 mL via INTRAVENOUS

## 2020-12-18 NOTE — Telephone Encounter (Addendum)
Wife dropped of paper on Friday pm with contact information for Medical Services and LTD Benefit Questions. Left message with Lou Miner with [employer] asking what does the provider need to do with this information? Left direct # for return call. Return call from Lou Miner and she reports his out of work is good until 03/29/21. Asking how much longer he will be out of work. Informed him he will need at least another 6 months from 03/29/21 if he is ever able to return. She reports that information is adequate. Medical Services: [phone] LTD Benefit Questions: [phone] [place], [place])

## 2020-12-18 NOTE — Telephone Encounter (Signed)
Scheduled appt per 4/4 sch msg. Pt's wife is aware.

## 2020-12-18 NOTE — Progress Notes (Signed)
Sent page to [place].) @ 2242  1406-Chevon Libbey, new admission from OSH. pt stable now on IV abx, thanks Abby, Therapist, sports

## 2020-12-18 NOTE — ED Notes (Signed)
Report called to [facility] RN took report

## 2020-12-18 NOTE — Plan of Care (Signed)

## 2020-12-18 NOTE — ED Triage Notes (Signed)
Patient was transferred from [facility] today d/t fever of 101.4 after the treatment.

## 2020-12-18 NOTE — Telephone Encounter (Signed)
TC to [place] at [facility] spoke with Sean [name]'s Nurse Sean [name] informed her that [name]'s Temp elevated to 100.3 and Dr. [name] would like to give verbal orders for Pt to receive UA/CNS with chest xray and covid test. Nurse verbalized understanding.

## 2020-12-18 NOTE — Progress Notes (Signed)
1700 Patient transported to ER after tylenol administration did not bring fever down post opdivo infusion. Patient transported via infusion chair to ER in stable condition. 24 gauge saline lock left in patient's left forearm. Caregiver present with patient during infusion and on transport to ER.

## 2020-12-18 NOTE — ED Provider Notes (Signed)
[place] EMERGENCY DEPT Provider Note   CSN: [id number] Arrival date & time: 12/18/20  1709     History Chief Complaint  Patient presents with  . Fever    Takahiro Godinho is a 55 y.o. male.  Presents to ER with concern for fever.  Wife states that this morning patient appeared somewhat flushed, but otherwise did not note any new symptoms.  Went to cancer center today for oncology visit and treatment with nivolumab.  After receiving the treatment he was found to have a fever of 101.4 and sent to ER for further evaluation.  History limited due to patient's confusion.  He does not provide any significant history.  History obtained from wife, chart review.  Patient has a history of metastatic melanoma.  Extensive brain mets.  Lives at skilled nursing facility.  HPI     Past Medical History:  Diagnosis Date  . Brain lesion 09/14/2020  . Cancer ([place])    Malignant melanoma to his brain  . Hyperlipidemia   . Liver lesion 09/14/2020    Patient Active Problem List   Diagnosis Date Noted  . Pneumonia 12/18/2020  . Malignant melanoma ([place]) 10/26/2020  . Brain metastasis ([place]) 10/20/2020  . Melanoma of skin ([place]) 10/05/2020  . Status post craniotomy 09/29/2020  . Brain tumor ([place]) 09/29/2020  . Elevated blood pressure reading 09/15/2020  . 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 Uncomplicated alcohol dependence ([place])   . Seizure ([place])   . Hypokalemia   . Hyponatremia   . Elevated LFTs   . Brain metastases ([place]) 09/14/2020    Past Surgical History:  Procedure Laterality Date  . APPLICATION OF CRANIAL NAVIGATION  09/29/2020   Procedure: APPLICATION OF CRANIAL NAVIGATION;  Surgeon: Judith Part, MD;  Location: [place];  Service: Neurosurgery;;  . Kyla Balzarine Left 09/29/2020   Procedure: Left craniotomy for tumor resection with brainlab;  Surgeon: Judith Part, MD;  Location: [place];  Service: Neurosurgery;  Laterality: Left;  .  HERNIA REPAIR     left side inguinal hernia about 18-20 years       Family History  Problem Relation Age of Onset  . Osteoarthritis Mother   . Hypertension Father   . Colon polyps Father   . Colon polyps Brother   . Colon polyps Brother   . Colon cancer Neg Hx   . Stomach cancer Neg Hx   . Rectal cancer Neg Hx   . Esophageal cancer Neg Hx   . Liver cancer Neg Hx     Social History   Tobacco Use  . Smoking status: Former Research scientist (life sciences)  . Smokeless tobacco: Never Used  . Tobacco comment: 09/27/20: per patient quit about over 20 years ago   Vaping Use  . Vaping Use: Never used  Substance Use Topics  . Alcohol use: Not Currently  . Drug use: No    Home Medications Prior to Admission medications   Medication Sig Start Date End Date Taking? Authorizing Provider  acetaminophen (TYLENOL) 325 MG tablet Take 2 tablets (650 mg total) by mouth every 6 (six) hours as needed for mild pain (or Fever >/= 101). 11/08/20   Angiulli, Lavon Paganini, PA-C  citalopram (CELEXA) 10 MG tablet Take 1 tablet (10 mg total) by mouth at bedtime. 11/08/20   Angiulli, Lavon Paganini, PA-C  dexamethasone (DECADRON) 2 MG tablet Take 1 tablet (2 mg total) by mouth daily. 11/08/20   Angiulli, Lavon Paganini, PA-C  HYDROcodone-acetaminophen (NORCO/VICODIN) 5-325 MG tablet Take  1 tablet by mouth every 6 (six) hours as needed for moderate pain. 11/27/20   [name], MD  levETIRAcetam (KEPPRA) 500 MG tablet Take 1 tablet (500 mg total) by mouth 2 (two) times daily. 11/08/20   Angiulli, Lavon Paganini, PA-C  LORazepam (ATIVAN) 1 MG tablet Take 1 tablet (1 mg total) by mouth once as needed for up to 1 dose for anxiety (MRI sedation). Patient not taking: Reported on 12/18/2020 12/04/20   Ventura Sellers, MD  melatonin 5 MG TABS Take 1 tablet (5 mg total) by mouth at bedtime as needed. 11/08/20   Angiulli, Lavon Paganini, PA-C  methylphenidate (RITALIN) 5 MG tablet Take 1 tablet (5 mg total) by mouth 2 (two) times daily. 11/14/20   [name], MD   Multiple Vitamin (MULTIVITAMIN WITH MINERALS) TABS tablet Take 1 tablet by mouth daily. Patient taking differently: Take 2 tablets by mouth daily. gummy 09/19/20   Geradine Girt, DO  pantoprazole (PROTONIX) 40 MG tablet Take 1 tablet (40 mg total) by mouth daily. 11/08/20   Angiulli, Lavon Paganini, PA-C  senna-docusate (SENOKOT-S) 8.6-50 MG tablet Take 2 tablets by mouth at bedtime. 11/08/20   Angiulli, Lavon Paganini, PA-C    Allergies    Patient has no known allergies.  Review of Systems   Review of Systems  Unable to perform ROS: Mental status change    Physical Exam Updated Vital Signs BP (!) 119/93   Pulse 94   Temp 98.2 F (36.8 C) (Temporal)   Resp 18   Ht 6' (1.829 m)   Wt 77.1 kg   SpO2 98%   BMI 23.06 kg/m   Physical Exam Vitals and nursing note reviewed.  Constitutional:      Appearance: He is well-developed.  HENT:     Head: Normocephalic and atraumatic.  Eyes:     Conjunctiva/sclera: Conjunctivae normal.  Cardiovascular:     Rate and Rhythm: Normal rate and regular rhythm.     Heart sounds: No murmur heard.   Pulmonary:     Effort: Pulmonary effort is normal. No respiratory distress.     Breath sounds: Normal breath sounds.  Abdominal:     Palpations: Abdomen is soft.     Tenderness: There is no abdominal tenderness.  Musculoskeletal:     Cervical back: Neck supple.  Skin:    General: Skin is warm and dry.     ED Results / Procedures / Treatments   Labs (all labs ordered are listed, but only abnormal results are displayed) Labs Reviewed  LACTIC ACID, PLASMA - Abnormal; Notable for the following components:      Result Value   Lactic Acid, Venous 2.4 (*)    All other components within normal limits  COMPREHENSIVE METABOLIC PANEL - Abnormal; Notable for the following components:   Glucose, Bld 116 (*)    Total Protein 6.4 (*)    AST 61 (*)    ALT 138 (*)    Total Bilirubin 1.5 (*)    All other components within normal limits  CBC WITH  DIFFERENTIAL/PLATELET - Abnormal; Notable for the following components:   Monocytes Absolute 1.1 (*)    Abs Immature Granulocytes 0.15 (*)    All other components within normal limits  RESP PANEL BY RT-PCR (FLU A&B, COVID) ARPGX2  URINE CULTURE  CULTURE, BLOOD (ROUTINE X 2)  CULTURE, BLOOD (ROUTINE X 2)  LACTIC ACID, PLASMA  URINALYSIS, ROUTINE W REFLEX MICROSCOPIC    EKG None  Radiology CT Head  Wo Contrast  Result Date: 12/18/2020 CLINICAL DATA:  Fever.  History of metastatic melanoma to the brain. EXAM: CT HEAD WITHOUT CONTRAST TECHNIQUE: Contiguous axial images were obtained from the base of the skull through the vertex without intravenous contrast. COMPARISON:  10/25/2020 FINDINGS: Brain: Hyperdense lesion again noted in the posterior left parietal lobe measuring 1.5 cm, stable since prior study. Numerous other hyperdense metastases noted. Encephalomalacia within the previously seen area of hemorrhage in the right basal ganglia. No new areas of hemorrhage, mass effect or midline shift. No hydrocephalus. Vascular: No hyperdense vessel or unexpected calcification. Skull: No acute calvarial abnormality. Sinuses/Orbits: No acute findings Other: None IMPRESSION: Extensive hemorrhagic metastases throughout the brain compatible with metastatic melanoma. No significant change since prior study. Electronically Signed   By: Rolm Baptise M.D.   On: 12/18/2020 18:39   DG Chest Port 1 View  Result Date: 12/18/2020 CLINICAL DATA:  Fever EXAM: PORTABLE CHEST 1 VIEW COMPARISON:  None. FINDINGS: The heart size and mediastinal contours are within normal limits. Mildly increased hazy airspace opacity is seen the left lower lung. The visualized skeletal structures are unremarkable. IMPRESSION: Mildly hazy airspace opacity at the left lower lung which could be due to atelectasis or infectious etiology. Electronically Signed   By: Prudencio Pair M.D.   On: 12/18/2020 18:30    Procedures Procedures    Medications Ordered in ED Medications  azithromycin (ZITHROMAX) 500 mg in sodium chloride 0.9 % 250 mL IVPB (has no administration in time range)  lactated ringers bolus 1,000 mL (1,000 mLs Intravenous New Bag/Given 12/18/20 1752)  cefTRIAXone (ROCEPHIN) 1 g in sodium chloride 0.9 % 100 mL IVPB (1 g Intravenous New Bag/Given 12/18/20 2055)  lactated ringers bolus 500 mL (500 mLs Intravenous Bolus from Bag 12/18/20 2058)    ED Course  I have reviewed the triage vital signs and the nursing notes.  Pertinent labs & imaging results that were available during my care of the patient were reviewed by me and considered in my medical decision making (see chart for details).    MDM Rules/Calculators/A&P                         55 year old male with history of metastatic melanoma including extensive brain metastases presents to ER with concern for fever.  On exam patient is somewhat confused, mildly tachycardic but otherwise appears stable.  Labs noted for mild elevation in lactic acid.  CXR with possible pneumonia.  Given cancer history, mental status, fever, possible pneumonia, believe he would benefit from admission for further observation and management.  Started antibiotics, discussed case with Dr. Hal Hope who will accept.  Final Clinical Impression(s) / ED Diagnoses Final diagnoses:  Pneumonia of left lung due to infectious organism, unspecified part of lung    Rx / DC Orders ED Discharge Orders    None       Lucrezia Starch, MD 12/18/20 2145

## 2020-12-18 NOTE — Progress Notes (Signed)
[place] OFFICE PROGRESS NOTE   Diagnosis: Malignant melanoma  INTERVAL HISTORY:   [name] returns as scheduled.  He is here with his wife.  He is now living in a skilled nursing facility.  His wife reports [name] is participating in physical therapy.  He has generally been more alert.  His appetite remains poor.  He vomits occasionally.  No diarrhea.  The rash over his back has improved.  Objective:  Vital signs in last 24 hours:  Blood pressure 103/88, pulse (!) 113, temperature 98.8 F (37.1 C), temperature source Tympanic, resp. rate 18, height 5\' 11"  (1.803 m), SpO2 98 %.    HEENT: No thrush Resp: Lungs clear bilaterally Cardio: Regular rate and rhythm GI: No hepatosplenomegaly, nontender Vascular: No leg edema Neuro: Alert, follows simple commands, moves all extremities to command, 4+/5 strength with dorsi flexion of the left foot, knows his wife and diagnosis of cancer.  Not oriented to year or month Skin: Mild erythematous rash over the back    Lab Results:  Lab Results  Component Value Date   WBC 11.5 (H) 11/27/2020   HGB 14.7 11/27/2020   HCT 43.7 11/27/2020   MCV 92.0 11/27/2020   PLT 321 11/27/2020   NEUTROABS 9.3 (H) 11/27/2020    CMP  Lab Results  Component Value Date   NA 143 12/18/2020   K 3.5 12/18/2020   CL 107 12/18/2020   CO2 24 12/18/2020   GLUCOSE 126 (H) 12/18/2020   BUN 11 12/18/2020   CREATININE 1.01 12/18/2020   CALCIUM 8.9 12/18/2020   PROT 6.4 (L) 12/18/2020   ALBUMIN 3.8 12/18/2020   AST 53 (H) 12/18/2020   ALT 131 (H) 12/18/2020   ALKPHOS 118 12/18/2020   BILITOT 1.6 (H) 12/18/2020   GFRNONAA >60 12/18/2020    Lab Results  Component Value Date   CEA1 0.6 09/15/2020    Lab Results  Component Value Date   INR 0.9 09/29/2020    Imaging:  No results found.  Medications: I have reviewed the patient's current medications.   Assessment/Plan:  1. Metastatic malignant melanoma presenting  with multiple brain metastases, unknown primary tumor site  CT brain 09/14/2020-multiple hemorrhagic metastases with surrounding edema in the bilateral cerebral hemispheres  CTs chest, abdomen, pelvis 09/14/2020-1.4 cm right upper lobe subpleural nodule, 0.5 cm right lower lobe nodule, multiple hypodensities in the liver suspicious for metastases, 1.5 cm right upper pole hypodense kidney lesion-indeterminate  MRI abdomen 09/15/2020-multifocal T1 hyperintense liver lesions, no kidney mass  Ultrasound-guided biopsy of a segment 4B liver lesion 09/18/2020-benign liver parenchyma with steatosis, no evidence of malignancy  PET 09/25/2020-focal area of hypermetabolism in the right hilum, multiple areas of the liver, pelvic musculature, and left thigh-no CT correlate with any of these areas on the noncontrast CT, 10 mm anterior right upper lobe nodule with low-level hypermetabolism, additional tiny pulmonary nodules too small to characterize by PET  SRS to 10 brain lesions 09/27/2020  Resection of left occipital and left frontal lesions on 09/29/2020-pathology consistent with malignant melanoma  Cycle 1 ipilimumab/nivolumab 10/16/2020  CT of the brain without contrast 10/26/2020 showed significant interval increase in the number and size of numerous metastases  Brain MRI 10/24/2020-multiple new and increased brain metastases, moderate edema surrounding multiple lesions, new mass-effect at the right lateral ventricle  Brain MRI 11/01/2020-stable appearance of his multiple intracranial metastases and associated edema and mild mass-effect.  CT of the chest with contrast 11/01/2020-interval progression of bilateral pulmonary nodules concerning for  metastatic disease, small right hilar lymph nodes, new and progressive ill-defined lesions in the liver compatible with progression of metastatic involvement.  Cycle 2 nivolumab 11/10/2020  Cycle 3 nivolumab 11/27/2020  MRI brain 12/13/2020-decrease in size of  hemorrhagic brain metastases, improvement in vasogenic edema and mass-effect, new areas of nonhemorrhagic enhancement by frontal and right parietal white matter-nonspecific  CT chest 12/08/2020-decreased size of lung and liver metastases 2.Left-sided weakness, new onset seizure secondary to #1, weakness has resolved 3.Hyperlipidemia 4.Elevated liver enzymes and bilirubin  Liver enzymes persistently elevated 5.Headache and nausea/vomiting secondary to #1, resolved 6. Hospital admission 10/20/2020-encephalopathy, slow Decadron taper-down to 2 mg at discharge 11/09/2020    Disposition: [name] has metastatic melanoma.  He has completed 3 treatments with nivolumab (ipilimumab was given with cycle 1).  The restaging CT is consistent with a response to therapy.  He appears more alert and communicative today.  I reviewed the CT images with [name] and his wife.  I recommend continuing nivolumab.  He will continue physical therapy at the skilled nursing facility.  [name] will return for an office visit in 2 weeks.  Betsy Coder, MD  12/18/2020  11:56 AM

## 2020-12-19 ENCOUNTER — Telehealth: Payer: Self-pay | Admitting: Internal Medicine

## 2020-12-19 DIAGNOSIS — C799 Secondary malignant neoplasm of unspecified site: Secondary | ICD-10-CM | POA: Diagnosis not present

## 2020-12-19 DIAGNOSIS — Z66 Do not resuscitate: Secondary | ICD-10-CM

## 2020-12-19 DIAGNOSIS — C7931 Secondary malignant neoplasm of brain: Secondary | ICD-10-CM | POA: Diagnosis present

## 2020-12-19 DIAGNOSIS — Z87891 Personal history of nicotine dependence: Secondary | ICD-10-CM | POA: Diagnosis not present

## 2020-12-19 DIAGNOSIS — Z20822 Contact with and (suspected) exposure to covid-19: Secondary | ICD-10-CM | POA: Diagnosis present

## 2020-12-19 DIAGNOSIS — Z79899 Other long term (current) drug therapy: Secondary | ICD-10-CM | POA: Diagnosis not present

## 2020-12-19 DIAGNOSIS — C787 Secondary malignant neoplasm of liver and intrahepatic bile duct: Secondary | ICD-10-CM | POA: Diagnosis present

## 2020-12-19 DIAGNOSIS — I959 Hypotension, unspecified: Secondary | ICD-10-CM | POA: Diagnosis not present

## 2020-12-19 DIAGNOSIS — E876 Hypokalemia: Secondary | ICD-10-CM | POA: Diagnosis present

## 2020-12-19 DIAGNOSIS — G40909 Epilepsy, unspecified, not intractable, without status epilepticus: Secondary | ICD-10-CM | POA: Diagnosis present

## 2020-12-19 DIAGNOSIS — E872 Acidosis: Secondary | ICD-10-CM | POA: Diagnosis present

## 2020-12-19 DIAGNOSIS — R451 Restlessness and agitation: Secondary | ICD-10-CM | POA: Diagnosis not present

## 2020-12-19 DIAGNOSIS — A419 Sepsis, unspecified organism: Secondary | ICD-10-CM | POA: Diagnosis present

## 2020-12-19 DIAGNOSIS — N179 Acute kidney failure, unspecified: Secondary | ICD-10-CM | POA: Diagnosis present

## 2020-12-19 DIAGNOSIS — Z7189 Other specified counseling: Secondary | ICD-10-CM

## 2020-12-19 DIAGNOSIS — C7800 Secondary malignant neoplasm of unspecified lung: Secondary | ICD-10-CM | POA: Diagnosis present

## 2020-12-19 DIAGNOSIS — R7989 Other specified abnormal findings of blood chemistry: Secondary | ICD-10-CM | POA: Diagnosis not present

## 2020-12-19 DIAGNOSIS — R4189 Other symptoms and signs involving cognitive functions and awareness: Secondary | ICD-10-CM

## 2020-12-19 DIAGNOSIS — Z6823 Body mass index (BMI) 23.0-23.9, adult: Secondary | ICD-10-CM | POA: Diagnosis not present

## 2020-12-19 DIAGNOSIS — J189 Pneumonia, unspecified organism: Secondary | ICD-10-CM | POA: Diagnosis present

## 2020-12-19 DIAGNOSIS — R6520 Severe sepsis without septic shock: Secondary | ICD-10-CM

## 2020-12-19 DIAGNOSIS — N10 Acute tubulo-interstitial nephritis: Secondary | ICD-10-CM | POA: Diagnosis not present

## 2020-12-19 DIAGNOSIS — C439 Malignant melanoma of skin, unspecified: Secondary | ICD-10-CM | POA: Diagnosis present

## 2020-12-19 DIAGNOSIS — E870 Hyperosmolality and hypernatremia: Secondary | ICD-10-CM | POA: Diagnosis not present

## 2020-12-19 DIAGNOSIS — N009 Acute nephritic syndrome with unspecified morphologic changes: Secondary | ICD-10-CM | POA: Diagnosis present

## 2020-12-19 DIAGNOSIS — R5381 Other malaise: Secondary | ICD-10-CM

## 2020-12-19 DIAGNOSIS — E785 Hyperlipidemia, unspecified: Secondary | ICD-10-CM | POA: Diagnosis present

## 2020-12-19 DIAGNOSIS — E440 Moderate protein-calorie malnutrition: Secondary | ICD-10-CM | POA: Diagnosis present

## 2020-12-19 LAB — COMPREHENSIVE METABOLIC PANEL
ALT: 222 U/L — ABNORMAL HIGH (ref 0–44)
AST: 135 U/L — ABNORMAL HIGH (ref 15–41)
Albumin: 3.2 g/dL — ABNORMAL LOW (ref 3.5–5.0)
Alkaline Phosphatase: 138 U/L — ABNORMAL HIGH (ref 38–126)
Anion gap: 10 (ref 5–15)
BUN: 13 mg/dL (ref 6–20)
CO2: 27 mmol/L (ref 22–32)
Calcium: 8.5 mg/dL — ABNORMAL LOW (ref 8.9–10.3)
Chloride: 108 mmol/L (ref 98–111)
Creatinine, Ser: 1.46 mg/dL — ABNORMAL HIGH (ref 0.61–1.24)
GFR, Estimated: 56 mL/min — ABNORMAL LOW (ref >60)
Glucose, Bld: 131 mg/dL — ABNORMAL HIGH (ref 70–99)
Potassium: 3.4 mmol/L — ABNORMAL LOW (ref 3.5–5.1)
Sodium: 145 mmol/L (ref 135–145)
Total Bilirubin: 1.8 mg/dL — ABNORMAL HIGH (ref 0.3–1.2)
Total Protein: 6.2 g/dL — ABNORMAL LOW (ref 6.5–8.1)

## 2020-12-19 LAB — PHOSPHORUS: Phosphorus: 3.9 mg/dL (ref 2.5–4.6)

## 2020-12-19 LAB — URINALYSIS, ROUTINE W REFLEX MICROSCOPIC
Bilirubin Urine: NEGATIVE
Glucose, UA: NEGATIVE mg/dL
Hgb urine dipstick: NEGATIVE
Ketones, ur: NEGATIVE mg/dL
Leukocytes,Ua: NEGATIVE
Nitrite: NEGATIVE
Protein, ur: NEGATIVE mg/dL
Specific Gravity, Urine: 1.005 (ref 1.005–1.030)
pH: 6 (ref 5.0–8.0)

## 2020-12-19 LAB — CBC WITH DIFFERENTIAL/PLATELET
Abs Immature Granulocytes: 0.09 10*3/uL — ABNORMAL HIGH (ref 0.00–0.07)
Basophils Absolute: 0 10*3/uL (ref 0.0–0.1)
Basophils Relative: 0 %
Eosinophils Absolute: 0.1 10*3/uL (ref 0.0–0.5)
Eosinophils Relative: 1 %
HCT: 39.5 % (ref 39.0–52.0)
Hemoglobin: 13.1 g/dL (ref 13.0–17.0)
Immature Granulocytes: 1 %
Lymphocytes Relative: 11 %
Lymphs Abs: 0.9 10*3/uL (ref 0.7–4.0)
MCH: 31.8 pg (ref 26.0–34.0)
MCHC: 33.2 g/dL (ref 30.0–36.0)
MCV: 95.9 fL (ref 80.0–100.0)
Monocytes Absolute: 0.8 10*3/uL (ref 0.1–1.0)
Monocytes Relative: 11 %
Neutro Abs: 5.9 10*3/uL (ref 1.7–7.7)
Neutrophils Relative %: 76 %
Platelets: 246 10*3/uL (ref 150–400)
RBC: 4.12 MIL/uL — ABNORMAL LOW (ref 4.22–5.81)
RDW: 14.2 % (ref 11.5–15.5)
WBC: 7.8 10*3/uL (ref 4.0–10.5)
nRBC: 0 % (ref 0.0–0.2)

## 2020-12-19 LAB — MAGNESIUM: Magnesium: 1.8 mg/dL (ref 1.7–2.4)

## 2020-12-19 LAB — LACTIC ACID, PLASMA: Lactic Acid, Venous: 2.4 mmol/L (ref 0.5–1.9)

## 2020-12-19 MED ORDER — ONDANSETRON HCL 4 MG PO TABS
4.0000 mg | ORAL_TABLET | Freq: Four times a day (QID) | ORAL | Status: DC | PRN
  Administered 2020-12-30: 4 mg via ORAL
  Filled 2020-12-19: qty 1

## 2020-12-19 MED ORDER — ONDANSETRON HCL 4 MG/2ML IJ SOLN
4.0000 mg | Freq: Four times a day (QID) | INTRAMUSCULAR | Status: DC | PRN
  Administered 2020-12-25 – 2021-01-01 (×4): 4 mg via INTRAVENOUS
  Filled 2020-12-19 (×4): qty 2

## 2020-12-19 MED ORDER — METHYLPHENIDATE HCL 5 MG PO TABS
5.0000 mg | ORAL_TABLET | Freq: Two times a day (BID) | ORAL | Status: DC
  Administered 2020-12-19 – 2020-12-25 (×11): 5 mg via ORAL
  Filled 2020-12-19 (×11): qty 1

## 2020-12-19 MED ORDER — LACTATED RINGERS IV BOLUS (SEPSIS)
500.0000 mL | Freq: Once | INTRAVENOUS | Status: AC
  Administered 2020-12-19: 500 mL via INTRAVENOUS

## 2020-12-19 MED ORDER — SODIUM CHLORIDE 0.9 % IV SOLN: INTRAVENOUS | Status: AC

## 2020-12-19 MED ORDER — LEVETIRACETAM 500 MG PO TABS
500.0000 mg | ORAL_TABLET | Freq: Two times a day (BID) | ORAL | Status: DC
  Administered 2020-12-19 – 2021-01-03 (×32): 500 mg via ORAL
  Filled 2020-12-19 (×31): qty 1

## 2020-12-19 MED ORDER — CITALOPRAM HYDROBROMIDE 20 MG PO TABS
10.0000 mg | ORAL_TABLET | Freq: Every day | ORAL | Status: DC
  Administered 2020-12-19 – 2021-01-03 (×16): 10 mg via ORAL
  Filled 2020-12-19 (×15): qty 1

## 2020-12-19 MED ORDER — PANTOPRAZOLE SODIUM 40 MG PO TBEC
40.0000 mg | DELAYED_RELEASE_TABLET | Freq: Every day | ORAL | Status: DC
  Administered 2020-12-19 – 2020-12-27 (×9): 40 mg via ORAL
  Filled 2020-12-19 (×9): qty 1

## 2020-12-19 MED ORDER — SODIUM CHLORIDE 0.9 % IV SOLN
1.0000 g | INTRAVENOUS | Status: AC
  Administered 2020-12-19 – 2020-12-22 (×4): 1 g via INTRAVENOUS
  Filled 2020-12-19 (×2): qty 1
  Filled 2020-12-19: qty 10
  Filled 2020-12-19: qty 1

## 2020-12-19 MED ORDER — DEXAMETHASONE 2 MG PO TABS
2.0000 mg | ORAL_TABLET | Freq: Every day | ORAL | Status: DC
  Administered 2020-12-19 – 2020-12-25 (×7): 2 mg via ORAL
  Filled 2020-12-19 (×7): qty 1

## 2020-12-19 MED ORDER — POTASSIUM CHLORIDE CRYS ER 20 MEQ PO TBCR
40.0000 meq | EXTENDED_RELEASE_TABLET | ORAL | Status: AC
  Administered 2020-12-19 (×2): 40 meq via ORAL
  Filled 2020-12-19 (×3): qty 2

## 2020-12-19 MED ORDER — ADULT MULTIVITAMIN W/MINERALS CH
1.0000 | ORAL_TABLET | Freq: Every day | ORAL | Status: DC
  Administered 2020-12-19 – 2021-01-03 (×16): 1 via ORAL
  Filled 2020-12-19 (×16): qty 1

## 2020-12-19 MED ORDER — SENNOSIDES-DOCUSATE SODIUM 8.6-50 MG PO TABS
2.0000 | ORAL_TABLET | Freq: Every day | ORAL | Status: DC
  Administered 2020-12-19 – 2021-01-03 (×15): 2 via ORAL
  Filled 2020-12-19 (×14): qty 2

## 2020-12-19 MED ORDER — MELATONIN 5 MG PO TABS
5.0000 mg | ORAL_TABLET | Freq: Every evening | ORAL | Status: DC | PRN
  Administered 2020-12-19 – 2021-01-03 (×13): 5 mg via ORAL
  Filled 2020-12-19 (×14): qty 1

## 2020-12-19 MED ORDER — SODIUM CHLORIDE 0.9 % IV SOLN
500.0000 mg | INTRAVENOUS | Status: AC
  Administered 2020-12-19 – 2020-12-23 (×5): 500 mg via INTRAVENOUS
  Filled 2020-12-19 (×5): qty 500

## 2020-12-19 NOTE — Telephone Encounter (Signed)
Rescheduled upcoming appointment per 4/4 schedule message. Patient's wife is aware of changes.

## 2020-12-19 NOTE — TOC Initial Note (Signed)
Transition of Care [facility]) - Initial/Assessment Note    Patient Details  Name: Sean [name] MRN: [id number] Date of Birth: [birth year]  Transition of Care (TOC) CM/SW Contact:    Joaquin Courts, RN Phone Number: 12/19/2020, 9:53 AM  Clinical Narrative:                 CM spoke with patient's spouse [name] who reports patient has been at [facility] for the past 17 days.  Reports she would like for patient to return there at discharge as he is still very weak and she feels he needs more rehab.  CM spoke with CP rep Teena, who states patient can return, CM questioned if another insurance auth will be necessary, Teena to verify and update CM (facility will need to initiate this auth).  Per [facility] facility policy for admission requires a Covid test within 7 days.  PT/OT eval is pending, TOC will continue to follow.  Expected Discharge Plan: [place] Barriers to Discharge: Continued Medical Work up   Patient Goals and CMS Choice Patient states their goals for this hospitalization and ongoing recovery are:: to return to [email or web address] Compare Post Acute Care list provided to:: Patient Represenative (must comment) Choice offered to / list presented to : Spouse  Expected Discharge Plan and Services Expected Discharge Plan: [place]   Discharge Planning Services: CM Consult Post Acute Care Choice: Resumption of Svcs/PTA Provider Living arrangements for the past 2 months: Single Family Home,Skilled Nursing Facility                                      Prior Living Arrangements/Services Living arrangements for the past 2 months: [place] Lives with:: Spouse,Facility Resident Patient language and need for interpreter reviewed:: Yes Do you feel safe going back to the place where you live?: Yes      Need for Family Participation in Patient Care: Yes (Comment) Care giver support system in place?:  Yes (comment)   Criminal Activity/Legal Involvement Pertinent to Current Situation/Hospitalization: No - Comment as needed  Activities of Daily Living   ADL Screening (condition at time of admission) Patient's cognitive ability adequate to safely complete daily activities?: No Is the patient deaf or have difficulty hearing?: No Does the patient have difficulty seeing, even when wearing glasses/contacts?: No Does the patient have difficulty concentrating, remembering, or making decisions?: Yes Patient able to express need for assistance with ADLs?: No Does the patient have difficulty dressing or bathing?: Yes Independently performs ADLs?: No Communication: Needs assistance Is this a change from baseline?: Pre-admission baseline Dressing (OT): Needs assistance Is this a change from baseline?: Pre-admission baseline Grooming: Needs assistance Is this a change from baseline?: Pre-admission baseline Feeding: Needs assistance Is this a change from baseline?: Pre-admission baseline Bathing: Needs assistance Is this a change from baseline?: Pre-admission baseline Toileting: Needs assistance Is this a change from baseline?: Pre-admission baseline In/Out Bed: Needs assistance Is this a change from baseline?: Pre-admission baseline Walks in Home: Needs assistance Is this a change from baseline?: Pre-admission baseline Does the patient have difficulty walking or climbing stairs?: Yes Weakness of Legs: None Weakness of Arms/Hands: None  Permission Sought/Granted                  Emotional Assessment           Psych Involvement: No (comment)  Admission  diagnosis:  Pneumonia [J18.9] Pneumonia of left lung due to infectious organism, unspecified part of lung [J18.9] Patient Active Problem List   Diagnosis Date Noted  . Pneumonia 12/18/2020  . Malignant melanoma ([place]) 10/26/2020  . Brain metastasis ([place]) 10/20/2020  . Melanoma of skin ([place]) 10/05/2020  . Status post  craniotomy 09/29/2020  . Brain tumor ([place]) 09/29/2020  . Elevated blood pressure reading 09/15/2020  . 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 Uncomplicated alcohol dependence ([place])   . Seizure ([place])   . Hypokalemia   . Hyponatremia   . Elevated LFTs   . Brain metastases ([place]) 09/14/2020   PCP:  [name], MD Pharmacy:   [facility] [place] - [address] [place] ([place] [address] Phone: [phone] Fax: [phone]     Social Determinants of Health (SDOH) Interventions    Readmission Risk Interventions No flowsheet data found.

## 2020-12-19 NOTE — Progress Notes (Signed)
IP PROGRESS NOTE  Subjective:   Sean [name] was seen at the cancer center yesterday for treatment with nivolumab.  He developed a fever while in the infusion room and was transferred to the emergency room for further evaluation. Sean [name] has no complaint today.  Objective: Vital signs in last 24 hours: Blood pressure 109/70, pulse 98, temperature 99.2 F (37.3 C), temperature source Oral, resp. rate 17, height 6' (1.829 m), weight 174 lb 13.2 oz (79.3 kg), SpO2 100 %.  Intake/Output from previous day: 04/04 0701 - 04/05 0700 In: 815.9 [IV Piggyback:815.9] Out: 1 [Urine:1]  Physical Exam:  HEENT: No thrush Lungs: Clear anteriorly Cardiac: Regular rate and rhythm Abdomen: Nontender, no hepatosplenomegaly Extremities: No leg edema Skin: 2-3 cm area of erythema at the sacrum, no skin breakdown  Neurologic: Alert, not oriented, follows simple commands, moves all extremities    Lab Results: Recent Labs    12/18/20 1736  WBC 9.8  HGB 13.9  HCT 41.4  PLT 237    BMET Recent Labs    12/18/20 1038 12/18/20 1736  NA 143 144  K 3.5 3.8  CL 107 106  CO2 24 27  GLUCOSE 126* 116*  BUN 11 14  CREATININE 1.01 1.23  CALCIUM 8.9 8.9    Lab Results  Component Value Date   CEA1 0.6 09/15/2020    Studies/Results: CT Head Wo Contrast  Result Date: 12/18/2020 CLINICAL DATA:  Fever.  History of metastatic melanoma to the brain. EXAM: CT HEAD WITHOUT CONTRAST TECHNIQUE: Contiguous axial images were obtained from the base of the skull through the vertex without intravenous contrast. COMPARISON:  10/25/2020 FINDINGS: Brain: Hyperdense lesion again noted in the posterior left parietal lobe measuring 1.5 cm, stable since prior study. Numerous other hyperdense metastases noted. Encephalomalacia within the previously seen area of hemorrhage in the right basal ganglia. No new areas of hemorrhage, mass effect or midline shift. No hydrocephalus. Vascular: No hyperdense vessel or  unexpected calcification. Skull: No acute calvarial abnormality. Sinuses/Orbits: No acute findings Other: None IMPRESSION: Extensive hemorrhagic metastases throughout the brain compatible with metastatic melanoma. No significant change since prior study. Electronically Signed   By: Rolm Baptise M.D.   On: 12/18/2020 18:39   DG Chest Port 1 View  Result Date: 12/18/2020 CLINICAL DATA:  Fever EXAM: PORTABLE CHEST 1 VIEW COMPARISON:  None. FINDINGS: The heart size and mediastinal contours are within normal limits. Mildly increased hazy airspace opacity is seen the left lower lung. The visualized skeletal structures are unremarkable. IMPRESSION: Mildly hazy airspace opacity at the left lower lung which could be due to atelectasis or infectious etiology. Electronically Signed   By: Prudencio Pair M.D.   On: 12/18/2020 18:30    Medications: I have reviewed the patient's current medications.  Assessment/Plan:  1. 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, unknown primary tumor site  CT brain 09/14/2020-multiple hemorrhagic metastases with surrounding edema in the bilateral cerebral hemispheres  CTs chest, abdomen, pelvis 09/14/2020-1.4 cm right upper lobe subpleural nodule, 0.5 cm right lower lobe nodule, multiple hypodensities in the liver suspicious for metastases, 1.5 cm right upper pole hypodense kidney lesion-indeterminate  MRI abdomen 09/15/2020-multifocal T1 hyperintense liver lesions, no kidney mass  Ultrasound-guided biopsy of a segment 4B liver lesion 09/18/2020-benign liver parenchyma with steatosis, no evidence of malignancy  PET 09/25/2020-focal area of hypermetabolism in the right hilum, multiple areas of the liver, pelvic musculature, and left thigh-no CT correlate with any of these areas on the noncontrast CT, 10 mm  anterior right upper lobe nodule with low-level hypermetabolism, additional tiny pulmonary nodules too small to characterize by PET  SRS to 10  brain lesions 09/27/2020  Resection of left occipital and left frontal lesions on 09/29/2020-pathology consistent with malignant melanoma  Cycle 1 ipilimumab/nivolumab 10/16/2020  CT of the brain without contrast 10/26/2020 showed significant interval increase in the number and size of numerous metastases  Brain MRI 10/24/2020-multiple new and increased brain metastases, moderate edema surrounding multiple lesions, new mass-effect at the right lateral ventricle  Brain MRI 11/01/2020-stable appearance of his multiple intracranial metastases and associated edema and mild mass-effect.  CT of the chest with contrast 11/01/2020-interval progression of bilateral pulmonary nodules concerning for metastatic disease, small right hilar lymph nodes, new and progressive ill-defined lesions in the liver compatible with progression of metastatic involvement.  Cycle 2 nivolumab 11/10/2020  Cycle 3 nivolumab 11/27/2020  MRI brain 12/13/2020-decrease in size of hemorrhagic brain metastases, improvement in vasogenic edema and mass-effect, new areas of nonhemorrhagic enhancement by frontal and right parietal white matter-nonspecific  CT chest 12/08/2020-decreased size of lung and liver metastases  Cycle 4 nivolumab 12/18/2020 2.Left-sided weakness, new onset seizure secondary to #1, weakness has resolved 3.Hyperlipidemia 4.Elevated liver enzymes and bilirubin  Liver enzymes persistently elevated 5.Headache and nausea/vomiting secondary to #1, resolved 6. Hospital admission 10/20/2020-encephalopathy, slow Decadron taper-down to 2 mg at discharge 11/09/2020 7.  Admission 12/18/2020 with fever  Sean [name] was admitted yesterday with a fever.  He has a persistent fever this morning.  There is no apparent source for infection.  I have a low clinical suspicion for pneumonia.  I reviewed the admission chest x-ray.  He does not have symptoms to suggest a source of infection.  The differential diagnosis includes tumor  fever related to the CNS or liver metastases and a subclinical infection. He will be stable for discharge to the skilled nursing facility if cultures remain negative. Recommendations: 1.  Continue broad-spectrum antibiotics, follow-up blood cultures 2.  Check urinalysis 3.  Continue Decadron and Keppra 4.  Follow-up as scheduled at the Cancer center for the next cycle of nivolumab.   LOS: 0 days   Betsy Coder, MD   12/19/2020, 8:43 AM

## 2020-12-19 NOTE — H&P (Signed)
History and Physical    Rickie Gange XBD:[id number] DOB: 05-05-1966 DOA: 12/18/2020  PCP: [name], MD Patient coming from: SNF, [place]  Chief Complaint: "A touch of pneumonia".  HPI: Sean [name] is a 55 y.o. 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  Patient is awake and alert.    Patient tells me he has "a touch of pneumonia".  He was not able to tell me his symptoms.  He responds no to pain, shortness of breath, cough, abdominal pain or headache.  At baseline, he has cognitive impairment due to brain mets.  He is only oriented to self, place and family at most.  Further history obtained from patient's wife over the phone.  Patient had a fever to 101.4 at [facility].  He also looked flushed and red in his face.  He has had decreased p.o. intake for about a week.  She also noted some redness on his knees and about his craniectomy site.  She says he complained of headache.  She thinks her breathing was labored but denies cough.  Denies nausea, vomiting, diarrhea or bladder habit change.  Patient was initially full code.  After discussion about goal of care, CODE STATUS pros and cons of CPR and intubation in light of patient's situation,  patient's wife felt DNR/DNI would be to his best interest.  She was understandably tearful as she made this decision.  In ED, tachycardic to 114.  Elevated BP to 150/136 before it dropped to 80/56.  Normal saturation on RA.  He spiked fever to 101.1 earlier this morning.  Significant labs include WBC of 9.8 (lower than baseline).  AST 61.  ALT 138.  Total bili 1.5.  Lactic acid 2.4> 2.7.  COVID-19 and influenza PCR nonreactive.  Portable CXR reported as LLL airspace opacity.  On my review, he has similar finding in RML as well.  CT head with stable extensive hemorrhagic mets.  Blood  cultures obtained.  Received 2 L of LR bolus.  Started on IV ceftriaxone and azithromycin.  Admission accepted by an overnight provider.  I was notified to evaluate patient for further care.  ROS All review of system negative except for pertinent positives and negatives as history of present illness above based on patient's wife report. PMH Past Medical History:  Diagnosis Date  . Brain lesion 09/14/2020  . Cancer ([facility])    Malignant melanoma to his brain  . Hyperlipidemia   . Liver lesion 09/14/2020   PSH Past Surgical History:  Procedure Laterality Date  . APPLICATION OF CRANIAL NAVIGATION  09/29/2020   Procedure: APPLICATION OF CRANIAL NAVIGATION;  Surgeon: Judith Part, MD;  Location: [facility];  Service: Neurosurgery;;  . Kyla Balzarine Left 09/29/2020   Procedure: Left craniotomy for tumor resection with brainlab;  Surgeon: Judith Part, MD;  Location: [facility];  Service: Neurosurgery;  Laterality: Left;  . HERNIA REPAIR     left side inguinal hernia about 18-20 years   Fam HX Family History  Problem Relation Age of Onset  . Osteoarthritis Mother   . Hypertension Father   . Colon polyps Father   . Colon polyps Brother   . Colon polyps Brother   . Colon cancer Neg Hx   . Stomach cancer Neg Hx   . Rectal cancer Neg Hx   . Esophageal cancer Neg Hx   .  Liver cancer Neg Hx     Social Hx  reports that he has quit smoking. He has never used smokeless tobacco. He reports previous alcohol use. He reports that he does not use drugs.  Allergy No Known Allergies Home Meds Prior to Admission medications   Medication Sig Start Date End Date Taking? Authorizing Provider  acetaminophen (TYLENOL) 325 MG tablet Take 2 tablets (650 mg total) by mouth every 6 (six) hours as needed for mild pain (or Fever >/= 101). 11/08/20  Yes Angiulli, Lavon Paganini, PA-C  citalopram (CELEXA) 10 MG tablet Take 1 tablet (10 mg total) by mouth at bedtime. 11/08/20  Yes Angiulli, Lavon Paganini, PA-C   dexamethasone (DECADRON) 2 MG tablet Take 1 tablet (2 mg total) by mouth daily. 11/08/20  Yes Angiulli, Lavon Paganini, PA-C  HYDROcodone-acetaminophen (NORCO/VICODIN) 5-325 MG tablet Take 1 tablet by mouth every 6 (six) hours as needed for moderate pain. 11/27/20  Yes [name], MD  levETIRAcetam (KEPPRA) 500 MG tablet Take 1 tablet (500 mg total) by mouth 2 (two) times daily. 11/08/20  Yes Angiulli, Lavon Paganini, PA-C  melatonin 5 MG TABS Take 1 tablet (5 mg total) by mouth at bedtime as needed. 11/08/20  Yes Angiulli, Lavon Paganini, PA-C  methylphenidate (RITALIN) 5 MG tablet Take 1 tablet (5 mg total) by mouth 2 (two) times daily. 11/14/20  Yes [name], MD  Multiple Vitamin (MULTIVITAMIN WITH MINERALS) TABS tablet Take 1 tablet by mouth daily. Patient taking differently: Take 2 tablets by mouth daily. gummy 09/19/20  Yes Vann, Jessica U, DO  pantoprazole (PROTONIX) 40 MG tablet Take 1 tablet (40 mg total) by mouth daily. 11/08/20  Yes Angiulli, Lavon Paganini, PA-C  senna-docusate (SENOKOT-S) 8.6-50 MG tablet Take 2 tablets by mouth at bedtime. 11/08/20  Yes Cathlyn Parsons, PA-C    Physical Exam: Vitals:   12/18/20 2231 12/19/20 0214 12/19/20 0547 12/19/20 0810  BP: 119/82 118/79 116/77 109/70  Pulse: 92 98 100 98  Resp: 20 20 18 17   Temp: 98.4 F (36.9 C) 98.3 F (36.8 C) (!) 101.1 F (38.4 C) 99.2 F (37.3 C)  TempSrc: Oral Oral Oral Oral  SpO2: 100% 100% 99% 100%  Weight:      Height:        GENERAL: No acute distress.  Appears well.  HEENT: MMM.  Vision and hearing grossly intact.  NECK: Supple.  No apparent JVD.  RESP:  No IWOB. Good air movement bilaterally. CVS:  RRR. Heart sounds normal.  ABD/GI/GU: Bowel sounds present. Soft. Non tender.  MSK/EXT:  Moves extremities. No apparent deformity or edema.  SKIN: no apparent skin lesion or wound NEURO: Awake and alert.  Oriented to self, place.  Follows commands.  No gross deficit.  PSYCH: Calm. Normal affect.   Personally Reviewed  Radiological Exams CT Head Wo Contrast  Result Date: 12/18/2020 CLINICAL DATA:  Fever.  History of metastatic melanoma to the brain. EXAM: CT HEAD WITHOUT CONTRAST TECHNIQUE: Contiguous axial images were obtained from the base of the skull through the vertex without intravenous contrast. COMPARISON:  10/25/2020 FINDINGS: Brain: Hyperdense lesion again noted in the posterior left parietal lobe measuring 1.5 cm, stable since prior study. Numerous other hyperdense metastases noted. Encephalomalacia within the previously seen area of hemorrhage in the right basal ganglia. No new areas of hemorrhage, mass effect or midline shift. No hydrocephalus. Vascular: No hyperdense vessel or unexpected calcification. Skull: No acute calvarial abnormality. Sinuses/Orbits: No acute findings Other: None IMPRESSION: Extensive hemorrhagic  metastases throughout the brain compatible with metastatic melanoma. No significant change since prior study. Electronically Signed   By: Rolm Baptise M.D.   On: 12/18/2020 18:39   DG Chest Port 1 View  Result Date: 12/18/2020 CLINICAL DATA:  Fever EXAM: PORTABLE CHEST 1 VIEW COMPARISON:  None. FINDINGS: The heart size and mediastinal contours are within normal limits. Mildly increased hazy airspace opacity is seen the left lower lung. The visualized skeletal structures are unremarkable. IMPRESSION: Mildly hazy airspace opacity at the left lower lung which could be due to atelectasis or infectious etiology. Electronically Signed   By: Prudencio Pair M.D.   On: 12/18/2020 18:30     Personally Reviewed Labs: CBC: Recent Labs  Lab 12/18/20 1736 12/19/20 1133  WBC 9.8 7.8  NEUTROABS 7.6 5.9  HGB 13.9 13.1  HCT 41.4 39.5  MCV 93.0 95.9  PLT 237 976   Basic Metabolic Panel: Recent Labs  Lab 12/18/20 1038 12/18/20 1736 12/19/20 1133  NA 143 144 145  K 3.5 3.8 3.4*  CL 107 106 108  CO2 24 27 27   GLUCOSE 126* 116* 131*  BUN 11 14 13   CREATININE 1.01 1.23 1.46*  CALCIUM 8.9 8.9  8.5*  MG  --   --  1.8  PHOS  --   --  3.9   GFR: Estimated Creatinine Clearance: 62 mL/min (A) (by C-G formula based on SCr of 1.46 mg/dL (H)). Liver Function Tests: Recent Labs  Lab 12/18/20 1038 12/18/20 1736 12/19/20 1133  AST 53* 61* 135*  ALT 131* 138* 222*  ALKPHOS 118 123 138*  BILITOT 1.6* 1.5* 1.8*  PROT 6.4* 6.4* 6.2*  ALBUMIN 3.8 3.7 3.2*   No results for input(s): LIPASE, AMYLASE in the last 168 hours. No results for input(s): AMMONIA in the last 168 hours. Coagulation Profile: No results for input(s): INR, PROTIME in the last 168 hours. Cardiac Enzymes: No results for input(s): CKTOTAL, CKMB, CKMBINDEX, TROPONINI in the last 168 hours. BNP (last 3 results) No results for input(s): PROBNP in the last 8760 hours. HbA1C: No results for input(s): HGBA1C in the last 72 hours. CBG: No results for input(s): GLUCAP in the last 168 hours. Lipid Profile: No results for input(s): CHOL, HDL, LDLCALC, TRIG, CHOLHDL, LDLDIRECT in the last 72 hours. Thyroid Function Tests: Recent Labs    12/18/20 1038  TSH 1.532   Anemia Panel: No results for input(s): VITAMINB12, FOLATE, FERRITIN, TIBC, IRON, RETICCTPCT in the last 72 hours. Urine analysis:    Component Value Date/Time   COLORURINE YELLOW 12/19/2020 1030   APPEARANCEUR CLEAR 12/19/2020 1030   LABSPEC 1.005 12/19/2020 1030   PHURINE 6.0 12/19/2020 1030   GLUCOSEU NEGATIVE 12/19/2020 1030   HGBUR NEGATIVE 12/19/2020 1030   BILIRUBINUR NEGATIVE 12/19/2020 1030   [place] 12/19/2020 1030   PROTEINUR NEGATIVE 12/19/2020 1030   NITRITE NEGATIVE 12/19/2020 1030   LEUKOCYTESUR NEGATIVE 12/19/2020 1030    Sepsis Labs:  Lactic acid 2.4> 2.7  Personally Reviewed EKG:  EKG ordered on admission and pending.  Assessment/Plan Severe sepsis due to community-acquired pneumonia: POA: Meets criteria with fever, tachycardia, hypotension and lactic acidosis.  Patient is on immunotherapy for metastatic melanoma.   However, his fever could also be due to malignancy.  Blood cultures NGTD.  UA without significant finding. -Continue ceftriaxone and azithromycin -Trend lactic acid -Check urine culture  Metastatic melanoma-with extensive mets including brain.  S/p craniotomy and resection of left occipital and left frontal lesion in 09/2020.  CT head shows  stable extensive hemorrhagic mets.  He is followed by Dr. [name].  He is on immunotherapy with nivolumab with decreased tumor burden on his recent brain MRI and CTs. -Oncology following-continue home dexamethasone and Keppra.  AKI: Likely prerenal from poor p.o. 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 -Check acute hepatitis panel -Continue trending  Hypokalemia: Likely due to IV fluid. -K-Dur 40 mEq x 2  Cognitive impairment likely from brain mets.  Awake and alert.  Oriented to self and place.  Follows commands.  Likely at baseline. -Reorientation and delirium precautions  Generalized weakness/debility -PT/OT  Goal of care discussion:  -DNR/DNI -See HPI about goal of care discussion with patient's wife  DVT prophylaxis: SCD in the setting of intracranial hemorrhagic mets  Code Status: DNR/DNI Family Communication: Updated patient's wife over the phone. Disposition Plan: Admit to telemetry Consults called: Oncology Admission status: Inpatient.  Immunocompromised patient with severe sepsis.  High risk for deconditioning and adverse outcome.  Level of care: Telemetry   Mercy Riding MD [facility]  If 7PM-7AM, please contact night-coverage [email or web address]  12/19/2020, 12:21 PM

## 2020-12-19 NOTE — Progress Notes (Signed)
(  Sent page to T. Opyd.) @ [id number]  1406-[place]. Clever, critical lab value for lactate acid 2.7, thanks Abby, Therapist, sports

## 2020-12-19 NOTE — Evaluation (Signed)
Physical Therapy Evaluation Patient Details Name: Sean [name] MRN: [id number] DOB: [birth year] Today's Date: 12/19/2020   History of Present Illness  Sean [name] is a 55 y.o. male.  Presents to ER with concern for fever.following cancer treatment on 12/18/20.Patient has a history of metastatic melanoma.  Extensive brain mets. Patient has been at [facility] for the past 17 days. per wife, plans to return per CM note.  Clinical Impression  Patient alert, follows directions inconsistently, requires multiple tactile/verbal cues to mobilize. Patient answers with short phrases, at times somewhat vague answers.  Patient required multiple trials to sit up on bed edge as patient kept putting his legs back onto bed, Finally, with 2 persons, patient assisted to sitting. Once sitting up, balance fair. Attempted multiple times to stand at [facility] with patient seeming to resist each effort. Finally stood up x 1 for only a few seconds and sat down.  Patient known to this Probation officer from previous admission. Patient does appear to have improved in motor status of Left extremities but cognition  Remains impaired significantly.  Pt admitted with above diagnosis.  Pt currently with functional limitations due to the deficits listed below (see PT Problem List). Pt will benefit from skilled PT to increase their independence and safety with mobility to allow discharge to the venue listed below.      Follow Up Recommendations SNF    Equipment Recommendations  None recommended by PT    Recommendations for Other Services       Precautions / Restrictions Precautions Precautions: Fall Precaution Comments: L weakness, incontinence      Mobility  Bed Mobility Overal bed mobility: Needs Assistance Bed Mobility: Rolling;Supine to Sit;Sit to Supine Rolling: Max assist;+2 for physical assistance;+2 for safety/equipment   Supine to sit: Max assist;+2 for physical assistance;+2 for safety/equipment;HOB elevated Sit to  supine: Max assist;+2 for physical assistance;+2 for safety/equipment   General bed mobility comments: multimodal cues with hand over hand assist to reach for bed rails to roll to each side. Multimodal cues to move legs to bed edge, tending to resist. Did place right leg back onto bed, assisted left leg. While sitting on bed edge, patient did use Both UE's to scoot  along bed x 3  and followed these directions.    Transfers Overall transfer level: Needs assistance   Transfers: Sit to/from Stand Sit to Stand: Max assist;+2 physical assistance;+2 safety/equipment         General transfer comment: multimodal cues to hold  bar and maintain grip. Required max cues to attempt standing, appears ressiting with UE's. Patient finally stood up x 1 for only a few seconds and sat down.  Ambulation/Gait                Stairs            Wheelchair Mobility    Modified Rankin (Stroke Patients Only)       Balance Overall balance assessment: Needs assistance Sitting-balance support: Feet supported Sitting balance-Leahy Scale: Fair Sitting balance - Comments: sits near midline once positioned on edge     Standing balance-Leahy Scale: Poor Standing balance comment: requires STEDy, knees were not being blocked by STEDY but patient did not stay upright for any time to assess.                             Pertinent Vitals/Pain Faces Pain Scale: No hurt    Home Living Family/patient expects to be  discharged to:: Skilled nursing facility                      Prior Function     Gait / Transfers Assistance Needed: unsure  functional level since in rehab. Patient unable to state           Hand Dominance        Extremity/Trunk Assessment   Upper Extremity Assessment Upper Extremity Assessment: Defer to OT evaluation    Lower Extremity Assessment Lower Extremity Assessment: RLE deficits/detail RLE Deficits / Details: did lift leg back onto bed LLE  Deficits / Details: noted decreased ability to place leg back onto bed  but  did bare weigh when stood briefly x 1.    Cervical / Trunk Assessment Cervical / Trunk Exceptions: does sit near midline,  Communication      Cognition Arousal/Alertness: Awake/alert Behavior During Therapy: Flat affect Overall Cognitive Status: Impaired/Different from baseline Area of Impairment: Orientation;Attention;Memory;Following commands;Awareness                 Orientation Level: Place;Time;Situation Current Attention Level: Focused Memory: Decreased short-term memory Following Commands: Follows one step commands inconsistently   Awareness: Intellectual   General Comments: Pt. known to this writer from previous admission. Speach and communication about the same. Answers with basic  words that give no real answer to questions.. Did state he was left handed. when asked where he is_"I here and there." Patient really did not follow any verbal  directions, required strong tactile cues to roll, move legs to bed edge multiple times but would place legs back onto bed.      General Comments      Exercises     Assessment/Plan    PT Assessment Patient needs continued PT services  PT Problem List Decreased strength;Decreased mobility;Decreased safety awareness;Decreased activity tolerance;Decreased cognition;Decreased balance       PT Treatment Interventions Therapeutic activities;DME instruction;Patient/family education;Cognitive remediation;Balance training;Wheelchair mobility training;Functional mobility training;Neuromuscular re-education    PT Goals (Current goals can be found in the Care Plan section)  Acute Rehab PT Goals PT Goal Formulation: Patient unable to participate in goal setting Time For Goal Achievement: 01/02/21 Potential to Achieve Goals: Fair    Frequency Min 2X/week   Barriers to discharge        Co-evaluation               AM-PAC PT "6 Clicks" Mobility   Outcome Measure Help needed turning from your back to your side while in a flat bed without using bedrails?: A Lot Help needed moving from lying on your back to sitting on the side of a flat bed without using bedrails?: A Lot Help needed moving to and from a bed to a chair (including a wheelchair)?: Total Help needed standing up from a chair using your arms (e.g., wheelchair or bedside chair)?: Total Help needed to walk in hospital room?: Total Help needed climbing 3-5 steps with a railing? : Total 6 Click Score: 8    End of Session Equipment Utilized During Treatment: Gait belt Activity Tolerance: Patient tolerated treatment well Patient left: in bed;with call bell/phone within reach;with bed alarm set Nurse Communication: Mobility status;Need for lift equipment PT Visit Diagnosis: Other symptoms and signs involving the nervous system (R29.898);Unsteadiness on feet (R26.81);Hemiplegia and hemiparesis Hemiplegia - Right/Left: Left Hemiplegia - dominant/non-dominant: Dominant Hemiplegia - caused by: Unspecified    Time: 1030-1100 PT Time Calculation (min) (ACUTE ONLY): 30 min   Charges:  PT Evaluation $PT Eval Moderate Complexity: 1 Mod PT Treatments $Therapeutic Activity: 8-22 mins        Tresa Endo PT Acute Rehabilitation Services Pager [phone] Office [phone]   Claretha Cooper 12/19/2020, 11:56 AM

## 2020-12-20 DIAGNOSIS — R569 Unspecified convulsions: Secondary | ICD-10-CM

## 2020-12-20 DIAGNOSIS — A419 Sepsis, unspecified organism: Secondary | ICD-10-CM | POA: Diagnosis not present

## 2020-12-20 DIAGNOSIS — C7931 Secondary malignant neoplasm of brain: Secondary | ICD-10-CM | POA: Diagnosis not present

## 2020-12-20 DIAGNOSIS — J189 Pneumonia, unspecified organism: Secondary | ICD-10-CM | POA: Diagnosis not present

## 2020-12-20 DIAGNOSIS — R6520 Severe sepsis without septic shock: Secondary | ICD-10-CM | POA: Diagnosis not present

## 2020-12-20 LAB — CBC WITH DIFFERENTIAL/PLATELET
Abs Immature Granulocytes: 0.09 10*3/uL — ABNORMAL HIGH (ref 0.00–0.07)
Basophils Absolute: 0 10*3/uL (ref 0.0–0.1)
Basophils Relative: 1 %
Eosinophils Absolute: 0 10*3/uL (ref 0.0–0.5)
Eosinophils Relative: 0 %
HCT: 40.2 % (ref 39.0–52.0)
Hemoglobin: 13.1 g/dL (ref 13.0–17.0)
Immature Granulocytes: 1 %
Lymphocytes Relative: 9 %
Lymphs Abs: 0.7 10*3/uL (ref 0.7–4.0)
MCH: 31.7 pg (ref 26.0–34.0)
MCHC: 32.6 g/dL (ref 30.0–36.0)
MCV: 97.3 fL (ref 80.0–100.0)
Monocytes Absolute: 0.7 10*3/uL (ref 0.1–1.0)
Monocytes Relative: 9 %
Neutro Abs: 6.2 10*3/uL (ref 1.7–7.7)
Neutrophils Relative %: 80 %
Platelets: 230 10*3/uL (ref 150–400)
RBC: 4.13 MIL/uL — ABNORMAL LOW (ref 4.22–5.81)
RDW: 14 % (ref 11.5–15.5)
WBC: 7.8 10*3/uL (ref 4.0–10.5)
nRBC: 0 % (ref 0.0–0.2)

## 2020-12-20 LAB — BLOOD CULTURE ID PANEL (REFLEXED) - BCID2

## 2020-12-20 LAB — AMMONIA: Ammonia: 10 umol/L (ref 9–35)

## 2020-12-20 LAB — HEPATITIS PANEL, ACUTE
HCV Ab: NONREACTIVE
Hep A IgM: NONREACTIVE
Hep B C IgM: NONREACTIVE
Hepatitis B Surface Ag: NONREACTIVE

## 2020-12-20 LAB — COMPREHENSIVE METABOLIC PANEL
ALT: 231 U/L — ABNORMAL HIGH (ref 0–44)
AST: 120 U/L — ABNORMAL HIGH (ref 15–41)
Albumin: 3.2 g/dL — ABNORMAL LOW (ref 3.5–5.0)
Alkaline Phosphatase: 139 U/L — ABNORMAL HIGH (ref 38–126)
Anion gap: 10 (ref 5–15)
BUN: 16 mg/dL (ref 6–20)
CO2: 26 mmol/L (ref 22–32)
Calcium: 8.4 mg/dL — ABNORMAL LOW (ref 8.9–10.3)
Chloride: 112 mmol/L — ABNORMAL HIGH (ref 98–111)
Creatinine, Ser: 1.53 mg/dL — ABNORMAL HIGH (ref 0.61–1.24)
GFR, Estimated: 53 mL/min — ABNORMAL LOW (ref >60)
Glucose, Bld: 117 mg/dL — ABNORMAL HIGH (ref 70–99)
Potassium: 4.1 mmol/L (ref 3.5–5.1)
Sodium: 148 mmol/L — ABNORMAL HIGH (ref 135–145)
Total Bilirubin: 1 mg/dL (ref 0.3–1.2)
Total Protein: 6.2 g/dL — ABNORMAL LOW (ref 6.5–8.1)

## 2020-12-20 LAB — APTT: aPTT: 33 seconds (ref 24–36)

## 2020-12-20 LAB — PROTIME-INR
INR: 1.2 (ref 0.8–1.2)
Prothrombin Time: 14.3 seconds (ref 11.4–15.2)

## 2020-12-20 LAB — URINE CULTURE: Culture: NO GROWTH

## 2020-12-20 LAB — MAGNESIUM: Magnesium: 2 mg/dL (ref 1.7–2.4)

## 2020-12-20 LAB — PHOSPHORUS: Phosphorus: 3.4 mg/dL (ref 2.5–4.6)

## 2020-12-20 MED ORDER — SODIUM CHLORIDE 0.9 % IV SOLN: INTRAVENOUS | Status: AC

## 2020-12-20 MED ORDER — ENSURE ENLIVE PO LIQD
237.0000 mL | Freq: Three times a day (TID) | ORAL | Status: DC
  Administered 2020-12-20 – 2020-12-31 (×27): 237 mL via ORAL

## 2020-12-20 NOTE — Progress Notes (Signed)
Patient has remained confused throughout day, gets up frequently without assistance and difficult to direct at times.  Have been unable to keep I&O due to patient pulling catheter off constantly.  Patient also not able to follow commands to feed self, needs assistance.

## 2020-12-20 NOTE — Evaluation (Signed)
Occupational Therapy Evaluation Patient Details Name: Sean [name] MRN: [id number] DOB: 03-17-1966 Today's Date: 12/20/2020    History of Present Illness Sean [name] is a 55 y.o. male.  Presents to ER with concern for fever.following cancer treatment on 12/18/20.Patient has a history of metastatic melanoma.  Extensive brain mets. Patient has been at [facility] for the past 17 days. per wife, plans to return per CM note.   Clinical Impression   [name] is a 55 year old man who presents with cognitive impairments, difficulty with motor planning and initiating tasks, mild left sided weakness and impaired balance. Patient needing min assist to transfer to side of bed and stand and take steps to recliner with RW. Patient able to don socks in supine but exhibiting need for more assistance with sit to stand ADLs. Patient pleasant and alert to self and able to follow at least 50% of commands without tactile cue. Patient will benefit from skilled OT services while in hospital to improve deficits and learn compensatory strategies as needed in order to improve functional abilities and reduce caregiver burden. Recommend return to SNF to finish rehab.    Follow Up Recommendations  SNF    Equipment Recommendations  None recommended by OT    Recommendations for Other Services       Precautions / Restrictions Precautions Precautions: Fall Precaution Comments: cognitive deficits, condom cath, L sided weakness Restrictions Weight Bearing Restrictions: No      Mobility Bed Mobility Overal bed mobility: Needs Assistance Bed Mobility: Supine to Sit     Supine to sit: Min assist;HOB elevated     General bed mobility comments: min assist to initiate movement of LEs and verbal cues for each step of sitting up.    Transfers Overall transfer level: Needs assistance Equipment used: Rolling walker (2 wheeled) Transfers: Sit to/from [facility] Sit to Stand: Min  assist Stand pivot transfers: Min assist       General transfer comment: Min assist to stand from low bed height with increased time for patient to motor plan. Min assist to take steps to recliner with verbal cues.    Balance Overall balance assessment: Needs assistance Sitting-balance support: No upper extremity supported Sitting balance-Leahy Scale: Poor Sitting balance - Comments: able to sit side of bed without UE   Standing balance support: Bilateral upper extremity supported;During functional activity Standing balance-Leahy Scale: Poor                             ADL either performed or assessed with clinical judgement   ADL Overall ADL's : Needs assistance/impaired Eating/Feeding: Set up;Sitting;Cueing for sequencing;Supervision/ safety   Grooming: Supervision/safety;Cueing for sequencing;Set up   Upper Body Bathing: Set up;Sitting;Cueing for sequencing;Supervision/ safety   Lower Body Bathing: Moderate assistance;Sit to/from stand;Cueing for safety;Cueing for sequencing   Upper Body Dressing : Sitting;Cueing for safety;Cueing for sequencing;Set up   Lower Body Dressing: Set up;Sit to/from stand;Moderate assistance Lower Body Dressing Details (indicate cue type and reason): able to don socks supine in bed but would need more assistance to don clothing with sit to stand as he has to hold onto walker and needs tactile and erbal cues. Toilet Transfer: Minimal assistance;BSC;Stand-pivot Armed forces technical officer Details (indicate cue type and reason): as replicated with recliner transfer [place] and Hygiene: Moderate assistance;Sit to/from stand;Cueing for safety;Cueing for sequencing               Vision Patient  Visual Report: No change from baseline       Perception     Praxis      Pertinent Vitals/Pain Pain Assessment: No/denies pain     Hand Dominance     Extremity/Trunk Assessment Upper Extremity Assessment Upper Extremity  Assessment: RUE deficits/detail;LUE deficits/detail RUE Deficits / Details: WNL ROM, 5/5 strength RUE Sensation: WNL RUE Coordination: WNL LUE Deficits / Details: WFL ROM, 4/5 shoulder, elbow 4/5, wrist 4/5, grip 4/5 LUE Sensation: WNL (appears functional - doesn't report numbness/tingling) LUE Coordination:  (functional coordination - fine and gross)   Lower Extremity Assessment Lower Extremity Assessment: Defer to PT evaluation   Cervical / Trunk Assessment Cervical / Trunk Assessment: Normal   Communication     Cognition Arousal/Alertness: Awake/alert Behavior During Therapy: WFL for tasks assessed/performed Overall Cognitive Status: History of cognitive impairments - at baseline Area of Impairment: Orientation;Attention;Memory;Following commands;Safety/judgement;Awareness;Problem solving                 Orientation Level: Disoriented to;Place;Time;Situation Current Attention Level: Sustained Memory: Decreased short-term memory Following Commands: Follows one step commands consistently Safety/Judgement: Decreased awareness of safety;Decreased awareness of deficits Awareness: Emergent Problem Solving: Slow processing;Decreased initiation;Difficulty sequencing;Requires verbal cues;Requires tactile cues General Comments: Today able to follow commands. Exhibits difficulty motor planning and initiating 50% of the time. Able to perform with verbal and tactile cues.   General Comments       Exercises     Shoulder Instructions      Home Living Family/patient expects to be discharged to:: Skilled nursing facility                                 Additional Comments: Patient an unreliable historian. PLOF unknown.      Prior Functioning/Environment    Gait / Transfers Assistance Needed: unsure  functional level since in rehab. Patient unable to state   Communication / Swallowing Assistance Needed: very limited verbal interaction with therapy, stated his  name          OT Problem List: Decreased strength;Decreased activity tolerance;Impaired balance (sitting and/or standing);Decreased cognition;Decreased safety awareness;Decreased coordination;Decreased knowledge of use of DME or AE      OT Treatment/Interventions: Self-care/ADL training;Therapeutic exercise;Therapeutic activities;Cognitive remediation/compensation;Patient/family education;Balance training;DME and/or AE instruction    OT Goals(Current goals can be found in the care plan section) Acute Rehab OT Goals OT Goal Formulation: Patient unable to participate in goal setting Time For Goal Achievement: 01/03/21 Potential to Achieve Goals: Fair  OT Frequency: Min 2X/week   Barriers to D/C:            Co-evaluation              AM-PAC OT "6 Clicks" Daily Activity     Outcome Measure Help from another person eating meals?: A Little Help from another person taking care of personal grooming?: A Little Help from another person toileting, which includes using toliet, bedpan, or urinal?: A Lot Help from another person bathing (including washing, rinsing, drying)?: A Lot Help from another person to put on and taking off regular upper body clothing?: A Little Help from another person to put on and taking off regular lower body clothing?: A Lot 6 Click Score: 15   End of Session Equipment Utilized During Treatment: Gait belt;Rolling walker Nurse Communication: Mobility status  Activity Tolerance: Patient tolerated treatment well Patient left: in chair;with call bell/phone within reach;with chair alarm set  OT Visit Diagnosis:  Other abnormalities of gait and mobility (R26.89);Muscle weakness (generalized) (M62.81);Other symptoms and signs involving cognitive function;Other symptoms and signs involving the nervous system (R29.898)                Time: 7062-3762 OT Time Calculation (min): 18 min Charges:  OT General Charges $OT Visit: 1 Visit OT Evaluation $OT Eval Moderate  Complexity: 1 Mod  Shaylah Mcghie, OTR/L [place]  Office [phone] Pager: [place] 12/20/2020, 9:18 AM

## 2020-12-20 NOTE — Progress Notes (Signed)
Initial Nutrition Assessment  DOCUMENTATION CODES:  Non-severe (moderate) malnutrition in context of chronic illness  INTERVENTION:  Continue current diet regimen.  Add Ensure Enlive po TID, each supplement provides 350 kcal and 20 grams of protein.  Add Magic cup TID with meals, each supplement provides 290 kcal and 9 grams of protein.  Continue MVI with minerals daily.  NUTRITION DIAGNOSIS:  Moderate Malnutrition related to chronic illness (Metastatic melanoma and CA treatment, cognitive impairment) as evidenced by severe fat depletion,moderate fat depletion,mild muscle depletion,moderate muscle depletion.  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 Pt from [facility].  Spoke with pt, wife, and family friend at bedside. Pt in good spirits. He reports eating well at SNF PTA and eating pretty well here also. Unable to give dietary recall. Pt fairly aware, but unable to elaborate on some questions.  He reports weight loss, but unsure of how much. He says it has been over the last few months. Per Epic, since mid January after craniotomy, pt has lost 39 lbs (18.5% of BW), which is severe and significant. Per exam, pt has depletions in many areas ranging from mild-severe.  Pt open to any supplements, calling them "magic drinks." Recommend Ensure TID and Magic Cup TID. Pt at risk for severe chronic malnutrition. If pt continues to lose weight or starts eating poorly PO, consider other supplements.  Relevant Medications: dexamethasone, MVI with minerals, Protonix, Senokot Labs: reviewed; Na 148,  CBG 83-140  NUTRITION - FOCUSED PHYSICAL EXAM: Flowsheet Row Most Recent Value  Orbital Region Moderate depletion  Upper Arm Region Severe depletion  Thoracic and Lumbar Region No depletion  Buccal Region Moderate depletion  Temple Region Moderate depletion  Clavicle Bone Region Mild depletion  Clavicle and Acromion Bone Region Mild depletion  Scapular Bone Region Unable to assess  Dorsal Hand Mild depletion  Patellar Region Moderate depletion  Anterior Thigh Region Moderate depletion  Posterior Calf Region Moderate depletion  Edema (RD Assessment) Mild  Hair Reviewed  Eyes Reviewed  Mouth Reviewed  Skin Reviewed  Nails Reviewed     Diet Order:   Diet Order            Diet regular Room service appropriate? Yes; Fluid consistency: Thin  Diet effective now                EDUCATION NEEDS:  Education needs have been addressed  Skin:  Skin Assessment: Skin Integrity Issues: Skin Integrity Issues:: Incisions Incisions: L posterior head  Last BM:  12/19/20 - Type 2, large amount  Height:  Ht Readings from Last 1 Encounters:  12/18/20 6' (1.829 m)   Weight:  Wt Readings from Last 1 Encounters:  12/18/20 79.3 kg   Ideal Body Weight:  81 kg  BMI:  Body mass index is 23.71 kg/m.  Estimated Nutritional Needs:  Kcal:  2400-2600 Protein:  80-95 grams Fluid:  >2 L  Derrel Nip, RD, LDN Registered Dietitian After Hours/Weekend Pager # in [place]

## 2020-12-20 NOTE — Progress Notes (Addendum)
Pt's wife requested to speak with a MD for more clarification in regards to pt's code status. Notified MD, per MD Stark Klein unless wife would like to change the status now, she has to speak with attending in the morning. Per wife, she does not plan to change code status now and will wait until in the morning to get further clarification from attending before changing.

## 2020-12-20 NOTE — Progress Notes (Addendum)
PROGRESS NOTE  Wesson Stith QQP:[id number] DOB: 01-17-1966 DOA: 12/18/2020 PCP: [name], MD   LOS: 1 day   Brief narrative: Tyrian Peart is a 55 y.o. 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  Patient does have baseline cognitive dysfunction due to metastatic brain disease and is only oriented to self and place.  History was obtained from the patient's family who reported a fever of 1 1.4 F with increased breathing.  In the ED patient was noted to be slightly tachycardic and had mild hypotension.  Laboratory data showed normal leukocyte count but lactate was elevated at 2.4 which trended up to 2.7.  COVID-19 and influenza test was negative.  Chest x-ray was reported as left lower lobe airspace opacity.  In the ED, CT head was performed which showed stable but extensive hemorrhagic metastasis.  Blood cultures were drawn from the ED.  Patient received 2 L of lactated ringer, Rocephin and Zithromax and was admitted to hospital for further evaluation and treatment.  Assessment/Plan:  Principal Problem:   Severe sepsis (HCC) Active Problems:   Brain metastases (HCC)   Seizure (HCC)   Hypokalemia   Elevated LFTs   Malignant melanoma metastatic to brain ([place])   Pneumonia  Severe sepsis due to community-acquired pneumonia:  Patient had fever tachycardia hypotension and lactate was elevated on presentation with possible source of infection as pneumonia.  Oncology has seen the patient.  Patient is on immunotherapy as outpatient.  Follow blood cultures, negative in less than 12 hours.  Continue IV Rocephin and Zithromax.   Urine culture with no growth.  Continue IV fluids.  Metastatic melanoma-with extensive mets including brain.   S/p craniotomy and resection of left occipital and left frontal  lesion in 09/2020.  CT head shows stable extensive hemorrhagic mets.    Followed by Dr [name] oncology.  Continue dexamethasone and Keppra.  Continue home dexamethasone and Keppra.  Acute kidney injury likely from poor oral intake. Continue to monitor.  On IV fluids.  Will consider renal ultrasound if not improving Lab Results  Component Value Date   CREATININE 1.53 (H) 12/20/2020   CREATININE 1.46 (H) 12/19/2020   CREATININE 1.23 12/18/2020   History of seizure due to brain mets Continue Keppra.  No seizure reported.  Elevated LFTs.  Likely secondary to metastatic disease.: Likely due to #1. Hepatitis panel was ordered.  Hypokalemia: Improved after replacement.  Cognitive impairment likely from brain mets.   Alert, awake but disoriented.  Generalized weakness/debility Continue physical therapy occupational therapy evaluation.  DVT prophylaxis: SCDs Start: 12/19/20 7124   Code Status: DNR/DNI  Family Communication: None  Status is: Inpatient  Remains inpatient appropriate because:IV treatments appropriate due to intensity of illness or inability to take PO and Inpatient level of care appropriate due to severity of illness  Dispo: The patient is from: SNF              Anticipated d/c is to: SNF              Patient currently is not medically stable to d/c.   Difficult to place patient No  Consultants:  Medical oncology  Procedures:  None  Anti-infectives:  Marland Kitchen Rocephin and Zithromax 4/4>  Anti-infectives (From admission, onward)   Start     Dose/Rate Route Frequency Ordered Stop   12/19/20 1800  cefTRIAXone (ROCEPHIN) 1 g in sodium chloride 0.9 % 100 mL IVPB        1 g 200 mL/hr over 30 Minutes Intravenous Every 24 hours 12/19/20 0715 12/23/20 1759   12/19/20 1500  azithromycin (ZITHROMAX) 500 mg in sodium chloride 0.9 % 250 mL IVPB        500 mg 250 mL/hr over 60 Minutes Intravenous Every 24 hours 12/19/20 1355     12/18/20 2015  cefTRIAXone (ROCEPHIN) 1  g in sodium chloride 0.9 % 100 mL IVPB        1 g 200 mL/hr over 30 Minutes Intravenous  Once 12/18/20 2011 12/18/20 2125   12/18/20 2015  azithromycin (ZITHROMAX) 500 mg in sodium chloride 0.9 % 250 mL IVPB  Status:  Discontinued        500 mg 250 mL/hr over 60 Minutes Intravenous  Once 12/18/20 2011 12/19/20 1355     Subjective: Today, patient was seen and examined at bedside.  Patient is disoriented to time place.  Denies any nausea vomiting fever chills cough or shortness of breath  Objective: Vitals:   12/19/20 2103 12/20/20 0525  BP: 106/69 120/81  Pulse: 93 85  Resp: 17 16  Temp: 98.9 F (37.2 C) 98.4 F (36.9 C)  SpO2: 99% 99%    Intake/Output Summary (Last 24 hours) at 12/20/2020 0939 Last data filed at 12/20/2020 0530 Gross per 24 hour  Intake 2175.62 ml  Output 1500 ml  Net 675.62 ml   Filed Weights   12/18/20 1709 12/18/20 2230  Weight: 77.1 kg 79.3 kg   Body mass index is 23.71 kg/m.   Physical Exam: GENERAL: Patient is alert awake but disoriented, not in obvious distress. HENT: No scleral pallor or icterus. Pupils equally reactive to light. Oral mucosa is moist NECK: is supple, no gross swelling noted. CHEST:  Diminished breath sounds bilaterally. CVS: S1 and S2 heard, no murmur. Regular rate and rhythm.  ABDOMEN: Soft, non-tender, bowel sounds are present. EXTREMITIES: No edema. CNS: Cranial nerves are intact. No focal motor deficits. SKIN: warm and dry without rashes.  Data Review: I have personally reviewed the following laboratory data and studies,  CBC: Recent Labs  Lab 12/18/20 1736 12/19/20 1133 12/20/20 0438  WBC 9.8 7.8 7.8  NEUTROABS 7.6 5.9 6.2  HGB 13.9 13.1 13.1  HCT 41.4 39.5 40.2  MCV 93.0 95.9 97.3  PLT 237 246 413   Basic Metabolic Panel: Recent Labs  Lab 12/18/20 1038 12/18/20 1736 12/19/20 1133 12/20/20 0438  NA 143 144 145 148*  K 3.5 3.8 3.4* 4.1  CL 107 106 108 112*  CO2 24 27 27 26   GLUCOSE 126* 116* 131* 117*   BUN 11 14 13 16   CREATININE 1.01 1.23 1.46* 1.53*  CALCIUM 8.9 8.9 8.5* 8.4*  MG  --   --  1.8 2.0  PHOS  --   --  3.9 3.4   Liver Function Tests: Recent Labs  Lab 12/18/20 1038 12/18/20 1736 12/19/20 1133 12/20/20 0438  AST 53* 61* 135* 120*  ALT 131* 138* 222* 231*  ALKPHOS 118 123 138* 139*  BILITOT 1.6* 1.5* 1.8* 1.0  PROT 6.4* 6.4* 6.2* 6.2*  ALBUMIN 3.8 3.7 3.2* 3.2*   No results for input(s): LIPASE, AMYLASE in the last 168 hours. Recent Labs  Lab 12/20/20 0438  AMMONIA 10   Cardiac Enzymes: No results for input(s): CKTOTAL, CKMB, CKMBINDEX, TROPONINI in the last 168 hours. BNP (last 3 results) No results for input(s): BNP  in the last 8760 hours.  ProBNP (last 3 results) No results for input(s): PROBNP in the last 8760 hours.  CBG: No results for input(s): GLUCAP in the last 168 hours. Recent Results (from the past 240 hour(s))  Blood Culture (routine x 2)     Status: Abnormal (Preliminary result)   Collection Time: 12/18/20  5:10 PM   Specimen: BLOOD RIGHT FOREARM  Result Value Ref Range Status   Specimen Description   Final    BLOOD RIGHT FOREARM Performed at [facility], [place] [address], [place], [place] [address]    Special Requests   Final    BOTTLES DRAWN AEROBIC ONLY Blood Culture results may not be optimal due to an inadequate volume of blood received in culture bottles Performed at [facility]    Culture  Setup Time   Final    AEROBIC BOTTLE ONLY GRAM POSITIVE COCCI CRITICAL RESULT CALLED TO, READ BACK BY AND VERIFIED WITH: E JACKSON PHARMD 12/20/20 0301 JDW    Culture (A)  Final    STAPHYLOCOCCUS EPIDERMIDIS THE SIGNIFICANCE OF ISOLATING THIS ORGANISM FROM A SINGLE SET OF BLOOD CULTURES WHEN MULTIPLE SETS ARE DRAWN IS UNCERTAIN. PLEASE NOTIFY THE MICROBIOLOGY DEPARTMENT WITHIN ONE WEEK IF SPECIATION AND SENSITIVITIES ARE REQUIRED. Performed at [facility], [place] [address] [place], [place] [address]    Report  Status PENDING  Incomplete  Blood Culture ID Panel (Reflexed)     Status: Abnormal   Collection Time: 12/18/20  5:10 PM  Result Value Ref Range Status   Enterococcus faecalis NOT DETECTED NOT DETECTED Final   Enterococcus Faecium NOT DETECTED NOT DETECTED Final   Listeria monocytogenes NOT DETECTED NOT DETECTED Final   Staphylococcus species DETECTED (A) NOT DETECTED Final    Comment: CRITICAL RESULT CALLED TO, READ BACK BY AND VERIFIED WITH: E JACKSON PHARMD 12/20/20 0301 JDW    Staphylococcus aureus (BCID) NOT DETECTED NOT DETECTED Final   Staphylococcus epidermidis DETECTED (A) NOT DETECTED Final    Comment: CRITICAL RESULT CALLED TO, READ BACK BY AND VERIFIED WITH: E JACKSON PHARMD 12/20/20 0301 JDW    Staphylococcus lugdunensis NOT DETECTED NOT DETECTED Final   Streptococcus species NOT DETECTED NOT DETECTED Final   Streptococcus agalactiae NOT DETECTED NOT DETECTED Final   Streptococcus pneumoniae NOT DETECTED NOT DETECTED Final   Streptococcus pyogenes NOT DETECTED NOT DETECTED Final   A.calcoaceticus-baumannii NOT DETECTED NOT DETECTED Final   Bacteroides fragilis NOT DETECTED NOT DETECTED Final   Enterobacterales NOT DETECTED NOT DETECTED Final   Enterobacter cloacae complex NOT DETECTED NOT DETECTED Final   Escherichia coli NOT DETECTED NOT DETECTED Final   Klebsiella aerogenes NOT DETECTED NOT DETECTED Final   Klebsiella oxytoca NOT DETECTED NOT DETECTED Final   Klebsiella pneumoniae NOT DETECTED NOT DETECTED Final   Proteus species NOT DETECTED NOT DETECTED Final   Salmonella species NOT DETECTED NOT DETECTED Final   Serratia marcescens NOT DETECTED NOT DETECTED Final   Haemophilus influenzae NOT DETECTED NOT DETECTED Final   Neisseria meningitidis NOT DETECTED NOT DETECTED Final   Pseudomonas aeruginosa NOT DETECTED NOT DETECTED Final   Stenotrophomonas maltophilia NOT DETECTED NOT DETECTED Final   Candida albicans NOT DETECTED NOT DETECTED Final   Candida auris NOT  DETECTED NOT DETECTED Final   Candida glabrata NOT DETECTED NOT DETECTED Final   Candida krusei NOT DETECTED NOT DETECTED Final   Candida parapsilosis NOT DETECTED NOT DETECTED Final   Candida tropicalis NOT DETECTED NOT DETECTED Final  Cryptococcus neoformans/gattii NOT DETECTED NOT DETECTED Final   Methicillin resistance mecA/C NOT DETECTED NOT DETECTED Final    Comment: Performed at [facility], [place] [address] [place], [place] [address]  Resp Panel by RT-PCR (Flu A&B, Covid) Nasopharyngeal Swab     Status: None   Collection Time: 12/18/20  5:36 PM   Specimen: Nasopharyngeal Swab; Nasopharyngeal(NP) swabs in vial transport medium  Result Value Ref Range Status   SARS Coronavirus 2 by RT PCR NEGATIVE NEGATIVE Final    Comment: (NOTE) SARS-CoV-2 target nucleic acids are NOT DETECTED.  The SARS-CoV-2 RNA is generally detectable in upper respiratory specimens during the acute phase of infection. The lowest concentration of SARS-CoV-2 viral copies this assay can detect is 138 copies/mL. A negative result does not preclude SARS-Cov-2 infection and should not be used as the sole basis for treatment or other patient management decisions. A negative result may occur with  improper specimen collection/handling, submission of specimen other than nasopharyngeal swab, presence of viral mutation(s) within the areas targeted by this assay, and inadequate number of viral copies(<138 copies/mL). A negative result must be combined with clinical observations, patient history, and epidemiological information. The expected result is Negative.  Fact Sheet for Patients:  EntrepreneurPulse.com.au  Fact Sheet for Healthcare Providers:  IncredibleEmployment.be  This test is no t yet approved or cleared by the [place] FDA and  has been authorized for detection and/or diagnosis of SARS-CoV-2 by FDA under an Emergency Use Authorization (EUA). This EUA will  remain  in effect (meaning this test can be used) for the duration of the COVID-19 declaration under Section 564(b)(1) of the Act, 21 U.S.C.section 360bbb-3(b)(1), unless the authorization is terminated  or revoked sooner.       Influenza A by PCR NEGATIVE NEGATIVE Final   Influenza B by PCR NEGATIVE NEGATIVE Final    Comment: (NOTE) The Xpert Xpress SARS-CoV-2/FLU/RSV plus assay is intended as an aid in the diagnosis of influenza from Nasopharyngeal swab specimens and should not be used as a sole basis for treatment. Nasal washings and aspirates are unacceptable for Xpert Xpress SARS-CoV-2/FLU/RSV testing.  Fact Sheet for Patients: EntrepreneurPulse.com.au  Fact Sheet for Healthcare Providers: IncredibleEmployment.be  This test is not yet approved or cleared by the [place] FDA and has been authorized for detection and/or diagnosis of SARS-CoV-2 by FDA under an Emergency Use Authorization (EUA). This EUA will remain in effect (meaning this test can be used) for the duration of the COVID-19 declaration under Section 564(b)(1) of the Act, 21 U.S.C. section 360bbb-3(b)(1), unless the authorization is terminated or revoked.  Performed at [facility]   Blood Culture (routine x 2)     Status: None (Preliminary result)   Collection Time: 12/18/20  5:38 PM   Specimen: BLOOD  Result Value Ref Range Status   Specimen Description BLOOD LEFT ANTECUBITAL  Final   Special Requests   Final    Blood Culture adequate volume BOTTLES DRAWN AEROBIC AND ANAEROBIC   Culture   Final    NO GROWTH < 12 HOURS Performed at [facility], [address], [place], [place] [address]    Report Status PENDING  Incomplete  Culture, Urine     Status: None   Collection Time: 12/19/20 10:30 AM   Specimen: Urine, Random  Result Value Ref Range Status   Specimen Description   Final    URINE, RANDOM Performed at [facility] [address] [place], [place]  [address]    Special Requests   Final    NONE Performed at [facility], [place] [address], [place], [place] [address]    Culture   Final    NO GROWTH Performed at [facility], [place] [address] [place], [place] [address]    Report Status 12/20/2020 FINAL  Final     Studies: CT Head Wo Contrast  Result Date: 12/18/2020 CLINICAL DATA:  Fever.  History of metastatic melanoma to the brain. EXAM: CT HEAD WITHOUT CONTRAST TECHNIQUE: Contiguous axial images were obtained from the base of the skull through the vertex without intravenous contrast. COMPARISON:  10/25/2020 FINDINGS: Brain: Hyperdense lesion again noted in the posterior left parietal lobe measuring 1.5 cm, stable since prior study. Numerous other hyperdense metastases noted. Encephalomalacia within the previously seen area of hemorrhage in the right basal ganglia. No new areas of hemorrhage, mass effect or midline shift. No hydrocephalus. Vascular: No hyperdense vessel or unexpected calcification. Skull: No acute calvarial abnormality. Sinuses/Orbits: No acute findings Other: None IMPRESSION: Extensive hemorrhagic metastases throughout the brain compatible with metastatic melanoma. No significant change since prior study. Electronically Signed   By: Rolm Baptise M.D.   On: 12/18/2020 18:39   DG Chest Port 1 View  Result Date: 12/18/2020 CLINICAL DATA:  Fever EXAM: PORTABLE CHEST 1 VIEW COMPARISON:  None. FINDINGS: The heart size and mediastinal contours are within normal limits. Mildly increased hazy airspace opacity is seen the left lower lung. The visualized skeletal structures are unremarkable. IMPRESSION: Mildly hazy airspace opacity at the left lower lung which could be due to atelectasis or infectious etiology. Electronically Signed   By: Prudencio Pair M.D.   On: 12/18/2020 18:30      Flora Lipps, MD  [facility] 12/20/2020  If 7PM-7AM, please contact  night-coverage

## 2020-12-20 NOTE — Progress Notes (Signed)
PHARMACY - PHYSICIAN COMMUNICATION CRITICAL VALUE ALERT - BLOOD CULTURE IDENTIFICATION (BCID)  Sean [name] is an 55 y.o. male who presented to [facility] on 12/18/2020 with a chief complaint of fever  Assessment:  1/3 aerobic, GPC, staph epi, no R  Name of physician (or Provider) Contacted: Opyd  Current antibiotics: CTX, azith  Changes to prescribed antibiotics recommended:  None, probable contaminant  Results for orders placed or performed during the hospital encounter of 12/18/20  Blood Culture ID Panel (Reflexed) (Collected: 12/18/2020  5:10 PM)  Result Value Ref Range   Enterococcus faecalis NOT DETECTED NOT DETECTED   Enterococcus Faecium NOT DETECTED NOT DETECTED   Listeria monocytogenes NOT DETECTED NOT DETECTED   Staphylococcus species DETECTED (A) NOT DETECTED   Staphylococcus aureus (BCID) NOT DETECTED NOT DETECTED   Staphylococcus epidermidis DETECTED (A) NOT DETECTED   Staphylococcus lugdunensis NOT DETECTED NOT DETECTED   Streptococcus species NOT DETECTED NOT DETECTED   Streptococcus agalactiae NOT DETECTED NOT DETECTED   Streptococcus pneumoniae NOT DETECTED NOT DETECTED   Streptococcus pyogenes NOT DETECTED NOT DETECTED   A.calcoaceticus-baumannii NOT DETECTED NOT DETECTED   Bacteroides fragilis NOT DETECTED NOT DETECTED   Enterobacterales NOT DETECTED NOT DETECTED   Enterobacter cloacae complex NOT DETECTED NOT DETECTED   Escherichia coli NOT DETECTED NOT DETECTED   Klebsiella aerogenes NOT DETECTED NOT DETECTED   Klebsiella oxytoca NOT DETECTED NOT DETECTED   Klebsiella pneumoniae NOT DETECTED NOT DETECTED   Proteus species NOT DETECTED NOT DETECTED   Salmonella species NOT DETECTED NOT DETECTED   Serratia marcescens NOT DETECTED NOT DETECTED   Haemophilus influenzae NOT DETECTED NOT DETECTED   Neisseria meningitidis NOT DETECTED NOT DETECTED   Pseudomonas aeruginosa NOT DETECTED NOT DETECTED   Stenotrophomonas maltophilia NOT DETECTED NOT DETECTED    Candida albicans NOT DETECTED NOT DETECTED   Candida auris NOT DETECTED NOT DETECTED   Candida glabrata NOT DETECTED NOT DETECTED   Candida krusei NOT DETECTED NOT DETECTED   Candida parapsilosis NOT DETECTED NOT DETECTED   Candida tropicalis NOT DETECTED NOT DETECTED   Cryptococcus neoformans/gattii NOT DETECTED NOT DETECTED   Methicillin resistance mecA/C NOT DETECTED NOT DETECTED    Dolly Rias RPh 12/20/2020, 3:39 AM

## 2020-12-21 ENCOUNTER — Ambulatory Visit: Payer: 59 | Admitting: Internal Medicine

## 2020-12-21 DIAGNOSIS — J189 Pneumonia, unspecified organism: Secondary | ICD-10-CM | POA: Diagnosis not present

## 2020-12-21 DIAGNOSIS — R6520 Severe sepsis without septic shock: Secondary | ICD-10-CM | POA: Diagnosis not present

## 2020-12-21 DIAGNOSIS — C7931 Secondary malignant neoplasm of brain: Secondary | ICD-10-CM | POA: Diagnosis not present

## 2020-12-21 DIAGNOSIS — E440 Moderate protein-calorie malnutrition: Secondary | ICD-10-CM

## 2020-12-21 DIAGNOSIS — R7989 Other specified abnormal findings of blood chemistry: Secondary | ICD-10-CM | POA: Diagnosis not present

## 2020-12-21 DIAGNOSIS — A419 Sepsis, unspecified organism: Secondary | ICD-10-CM | POA: Diagnosis not present

## 2020-12-21 LAB — CBC
HCT: 32.9 % — ABNORMAL LOW (ref 39.0–52.0)
Hemoglobin: 10.7 g/dL — ABNORMAL LOW (ref 13.0–17.0)
MCH: 31.3 pg (ref 26.0–34.0)
MCHC: 32.5 g/dL (ref 30.0–36.0)
MCV: 96.2 fL (ref 80.0–100.0)
Platelets: 205 10*3/uL (ref 150–400)
RBC: 3.42 MIL/uL — ABNORMAL LOW (ref 4.22–5.81)
RDW: 14.2 % (ref 11.5–15.5)
WBC: 8.2 10*3/uL (ref 4.0–10.5)
nRBC: 0 % (ref 0.0–0.2)

## 2020-12-21 LAB — CULTURE, BLOOD (ROUTINE X 2)

## 2020-12-21 LAB — COMPREHENSIVE METABOLIC PANEL
ALT: 156 U/L — ABNORMAL HIGH (ref 0–44)
AST: 59 U/L — ABNORMAL HIGH (ref 15–41)
Albumin: 2.4 g/dL — ABNORMAL LOW (ref 3.5–5.0)
Alkaline Phosphatase: 119 U/L (ref 38–126)
Anion gap: 8 (ref 5–15)
BUN: 14 mg/dL (ref 6–20)
CO2: 24 mmol/L (ref 22–32)
Calcium: 8 mg/dL — ABNORMAL LOW (ref 8.9–10.3)
Chloride: 113 mmol/L — ABNORMAL HIGH (ref 98–111)
Creatinine, Ser: 1.51 mg/dL — ABNORMAL HIGH (ref 0.61–1.24)
GFR, Estimated: 54 mL/min — ABNORMAL LOW (ref >60)
Glucose, Bld: 104 mg/dL — ABNORMAL HIGH (ref 70–99)
Potassium: 3.6 mmol/L (ref 3.5–5.1)
Sodium: 145 mmol/L (ref 135–145)
Total Bilirubin: 0.6 mg/dL (ref 0.3–1.2)
Total Protein: 5.2 g/dL — ABNORMAL LOW (ref 6.5–8.1)

## 2020-12-21 LAB — PHOSPHORUS: Phosphorus: 2.7 mg/dL (ref 2.5–4.6)

## 2020-12-21 LAB — MAGNESIUM: Magnesium: 1.7 mg/dL (ref 1.7–2.4)

## 2020-12-21 LAB — LACTIC ACID, PLASMA: Lactic Acid, Venous: 1.3 mmol/L (ref 0.5–1.9)

## 2020-12-21 MED ORDER — SODIUM CHLORIDE 0.9 % IV SOLN: INTRAVENOUS | Status: DC

## 2020-12-21 NOTE — Progress Notes (Addendum)
PROGRESS NOTE  Sean [name] ZOX:[id number] DOB: 1965/11/22 DOA: 12/18/2020 PCP: [name], MD   LOS: 2 days   Brief narrative: Sean [name] is a 55 y.o. 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  Patient does have baseline cognitive dysfunction due to metastatic brain disease and is only oriented to self and place.  History was obtained from the patient's family who reported a fever of 101.4 F with increased breathing.  In the ED patient was noted to be slightly tachycardic and had mild hypotension.  Laboratory data showed normal leukocyte count but lactate was elevated at 2.4 which trended up to 2.7.  COVID-19 and influenza test was negative.  Chest x-ray was reported as left lower lobe airspace opacity.  In the ED, CT head was performed which showed stable but extensive hemorrhagic metastasis.  Blood cultures were drawn from the ED.  Patient received 2 L of lactated ringer, Rocephin and Zithromax and was admitted to hospital for further evaluation and treatment.  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  Oncology has seen the patient.  Patient is on immunotherapy as outpatient.  Follow blood cultures, negative in 2 days.  Continue IV Rocephin and Zithromax.   Urine culture with no growth.  Lactate this morning has normalized to 1.3.    Metastatic melanoma-with extensive mets including brain.   S/p  craniotomy and resection of left occipital and left frontal lesion in 09/2020.  CT head shows stable extensive hemorrhagic mets.    Followed by Dr [name] oncology.  Continue dexamethasone and Keppra from home.    Acute kidney injury likely from poor oral intake. Continue to monitor.  On IV fluids.  Will consider renal ultrasound if not improving.  Baseline creatinine of around 0.6-0.7.  Continue with IV fluids for now.  Avoid nephrotoxic medications.  Lab Results  Component Value Date   CREATININE 1.51 (H) 12/21/2020   CREATININE 1.53 (H) 12/20/2020   CREATININE 1.46 (H) 12/19/2020   History of seizure due to brain mets Continue Keppra.    Elevated LFTs.  Likely secondary to metastatic disease. Hepatitis panel was nonreactive.  Hypokalemia: Improved after replacement.  Latest potassium 3.6.  Cognitive impairment likely from brain mets.   Alert, awake but disoriented.  Generalized weakness/debility Continue physical therapy, occupational therapy evaluation.  Patient is currently at the skilled nursing facility.  DVT prophylaxis: SCDs Start: 12/19/20 8119   Code Status: Partial code  Family Communication:  I spoke with the patient's wife on the phone and updated her about the clinical condition of the patient.  Status is: Inpatient  Remains inpatient appropriate because:IV treatments appropriate due to intensity of illness or inability to take PO and Inpatient level of care appropriate due to severity of illness  Dispo: The patient is from: SNF              Anticipated d/c is to: SNF              Patient  currently is not medically stable to d/c.   Difficult to place patient No  Consultants:  Medical oncology  Procedures:  None  Anti-infectives:  Marland Kitchen Rocephin and Zithromax 4/4>  Anti-infectives (From admission, onward)   Start     Dose/Rate Route Frequency Ordered Stop   12/19/20 1800  cefTRIAXone (ROCEPHIN) 1 g in sodium chloride 0.9 % 100 mL IVPB        1  g 200 mL/hr over 30 Minutes Intravenous Every 24 hours 12/19/20 0715 12/23/20 1759   12/19/20 1500  azithromycin (ZITHROMAX) 500 mg in sodium chloride 0.9 % 250 mL IVPB        500 mg 250 mL/hr over 60 Minutes Intravenous Every 24 hours 12/19/20 1355     12/18/20 2015  cefTRIAXone (ROCEPHIN) 1 g in sodium chloride 0.9 % 100 mL IVPB        1 g 200 mL/hr over 30 Minutes Intravenous  Once 12/18/20 2011 12/18/20 2125   12/18/20 2015  azithromycin (ZITHROMAX) 500 mg in sodium chloride 0.9 % 250 mL IVPB  Status:  Discontinued        500 mg 250 mL/hr over 60 Minutes Intravenous  Once 12/18/20 2011 12/19/20 1355     Subjective: Today, patient was seen and examined at bedside.  Feels well. Denies nausea, vomiting or fever.  Objective: Vitals:   12/20/20 2136 12/21/20 0709  BP: 117/87 131/73  Pulse: 81 92  Resp: 17 16  Temp: 98.1 F (36.7 C) 98.5 F (36.9 C)  SpO2: 99% 99%    Intake/Output Summary (Last 24 hours) at 12/21/2020 0848 Last data filed at 12/20/2020 2207 Gross per 24 hour  Intake 543.19 ml  Output 301 ml  Net 242.19 ml   Filed Weights   12/18/20 1709 12/18/20 2230  Weight: 77.1 kg 79.3 kg   Body mass index is 23.71 kg/m.   Physical Exam: GENERAL: Patient is alert awake but disoriented, not in obvious distress. HENT: No scleral pallor or icterus. Pupils equally reactive to light. Oral mucosa is moist NECK: is supple, no gross swelling noted. CHEST:  Diminished breath sounds bilaterally. CVS: S1 and S2 heard, no murmur. Regular rate and rhythm.  ABDOMEN: Soft, non-tender, bowel sounds are present. EXTREMITIES: No edema. CNS: Cranial nerves are intact. No focal motor deficits. SKIN: warm and dry without rashes.  Data Review: I have personally reviewed the following laboratory data and studies,  CBC: Recent Labs  Lab 12/18/20 1736 12/19/20 1133 12/20/20 0438 12/21/20 0421  WBC 9.8 7.8 7.8 8.2  NEUTROABS 7.6 5.9 6.2  --   HGB 13.9 13.1 13.1 10.7*  HCT 41.4  39.5 40.2 32.9*  MCV 93.0 95.9 97.3 96.2  PLT 237 246 230 258   Basic Metabolic Panel: Recent Labs  Lab 12/18/20 1038 12/18/20 1736 12/19/20 1133 12/20/20 0438 12/21/20 0421  NA 143 144 145 148* 145  K 3.5 3.8 3.4* 4.1 3.6  CL 107 106 108 112* 113*  CO2 24 27 27 26 24   GLUCOSE 126* 116* 131* 117* 104*  BUN 11 14 13 16 14   CREATININE 1.01 1.23 1.46* 1.53* 1.51*  CALCIUM 8.9 8.9 8.5* 8.4* 8.0*  MG  --   --  1.8 2.0 1.7  PHOS  --   --  3.9 3.4 2.7   Liver Function Tests: Recent Labs  Lab 12/18/20 1038 12/18/20 1736 12/19/20 1133 12/20/20 0438 12/21/20 0421  AST 53* 61* 135* 120* 59*  ALT 131* 138* 222* 231* 156*  ALKPHOS 118 123  138* 139* 119  BILITOT 1.6* 1.5* 1.8* 1.0 0.6  PROT 6.4* 6.4* 6.2* 6.2* 5.2*  ALBUMIN 3.8 3.7 3.2* 3.2* 2.4*   No results for input(s): LIPASE, AMYLASE in the last 168 hours. Recent Labs  Lab 12/20/20 0438  AMMONIA 10   Cardiac Enzymes: No results for input(s): CKTOTAL, CKMB, CKMBINDEX, TROPONINI in the last 168 hours. BNP (last 3 results) No results for input(s): BNP in the last 8760 hours.  ProBNP (last 3 results) No results for input(s): PROBNP in the last 8760 hours.  CBG: No results for input(s): GLUCAP in the last 168 hours. Recent Results (from the past 240 hour(s))  Blood Culture (routine x 2)     Status: Abnormal   Collection Time: 12/18/20  5:10 PM   Specimen: BLOOD RIGHT FOREARM  Result Value Ref Range Status   Specimen Description   Final    BLOOD RIGHT FOREARM Performed at [facility], [address], [place], [place] [address]    Special Requests   Final    BOTTLES DRAWN AEROBIC ONLY Blood Culture results may not be optimal due to an inadequate volume of blood received in culture bottles Performed at [facility]    Culture  Setup Time   Final    AEROBIC BOTTLE ONLY GRAM POSITIVE COCCI CRITICAL RESULT CALLED TO, READ BACK BY AND VERIFIED WITH: E JACKSON PHARMD 12/20/20 0301 JDW    Culture  (A)  Final    STAPHYLOCOCCUS EPIDERMIDIS THE SIGNIFICANCE OF ISOLATING THIS ORGANISM FROM A SINGLE SET OF BLOOD CULTURES WHEN MULTIPLE SETS ARE DRAWN IS UNCERTAIN. PLEASE NOTIFY THE MICROBIOLOGY DEPARTMENT WITHIN ONE WEEK IF SPECIATION AND SENSITIVITIES ARE REQUIRED. Performed at [facility], [place] [address], [place], [place] [address]    Report Status 12/21/2020 FINAL  Final  Blood Culture ID Panel (Reflexed)     Status: Abnormal   Collection Time: 12/18/20  5:10 PM  Result Value Ref Range Status   Enterococcus faecalis NOT DETECTED NOT DETECTED Final   Enterococcus Faecium NOT DETECTED NOT DETECTED Final   Listeria monocytogenes NOT DETECTED NOT DETECTED Final   Staphylococcus species DETECTED (A) NOT DETECTED Final    Comment: CRITICAL RESULT CALLED TO, READ BACK BY AND VERIFIED WITH: E JACKSON PHARMD 12/20/20 0301 JDW    Staphylococcus aureus (BCID) NOT DETECTED NOT DETECTED Final   Staphylococcus epidermidis DETECTED (A) NOT DETECTED Final    Comment: CRITICAL RESULT CALLED TO, READ BACK BY AND VERIFIED WITH: E JACKSON PHARMD 12/20/20 0301 JDW    Staphylococcus lugdunensis NOT DETECTED NOT DETECTED Final   Streptococcus species NOT DETECTED NOT DETECTED Final   Streptococcus agalactiae NOT DETECTED NOT DETECTED Final   Streptococcus pneumoniae NOT DETECTED NOT DETECTED Final   Streptococcus pyogenes NOT DETECTED NOT DETECTED Final   A.calcoaceticus-baumannii NOT DETECTED NOT DETECTED Final   Bacteroides fragilis NOT DETECTED NOT DETECTED Final   Enterobacterales NOT DETECTED NOT DETECTED Final   Enterobacter cloacae complex NOT DETECTED NOT DETECTED Final   Escherichia coli NOT DETECTED NOT DETECTED Final   Klebsiella aerogenes NOT DETECTED NOT DETECTED Final   Klebsiella oxytoca NOT DETECTED NOT DETECTED Final   Klebsiella pneumoniae NOT DETECTED NOT DETECTED Final   Proteus species NOT DETECTED NOT DETECTED Final   Salmonella species NOT DETECTED NOT DETECTED Final    Serratia marcescens NOT DETECTED NOT DETECTED Final   Haemophilus influenzae NOT DETECTED NOT DETECTED Final   Neisseria meningitidis NOT DETECTED NOT DETECTED Final   Pseudomonas  aeruginosa NOT DETECTED NOT DETECTED Final   Stenotrophomonas maltophilia NOT DETECTED NOT DETECTED Final   Candida albicans NOT DETECTED NOT DETECTED Final   Candida auris NOT DETECTED NOT DETECTED Final   Candida glabrata NOT DETECTED NOT DETECTED Final   Candida krusei NOT DETECTED NOT DETECTED Final   Candida parapsilosis NOT DETECTED NOT DETECTED Final   Candida tropicalis NOT DETECTED NOT DETECTED Final   Cryptococcus neoformans/gattii NOT DETECTED NOT DETECTED Final   Methicillin resistance mecA/C NOT DETECTED NOT DETECTED Final    Comment: Performed at [facility], [place] [address] [place], [place] [address]  Resp Panel by RT-PCR (Flu A&B, Covid) Nasopharyngeal Swab     Status: None   Collection Time: 12/18/20  5:36 PM   Specimen: Nasopharyngeal Swab; Nasopharyngeal(NP) swabs in vial transport medium  Result Value Ref Range Status   SARS Coronavirus 2 by RT PCR NEGATIVE NEGATIVE Final    Comment: (NOTE) SARS-CoV-2 target nucleic acids are NOT DETECTED.  The SARS-CoV-2 RNA is generally detectable in upper respiratory specimens during the acute phase of infection. The lowest concentration of SARS-CoV-2 viral copies this assay can detect is 138 copies/mL. A negative result does not preclude SARS-Cov-2 infection and should not be used as the sole basis for treatment or other patient management decisions. A negative result may occur with  improper specimen collection/handling, submission of specimen other than nasopharyngeal swab, presence of viral mutation(s) within the areas targeted by this assay, and inadequate number of viral copies(<138 copies/mL). A negative result must be combined with clinical observations, patient history, and epidemiological information. The expected result is  Negative.  Fact Sheet for Patients:  EntrepreneurPulse.com.au  Fact Sheet for Healthcare Providers:  IncredibleEmployment.be  This test is no t yet approved or cleared by the [place] FDA and  has been authorized for detection and/or diagnosis of SARS-CoV-2 by FDA under an Emergency Use Authorization (EUA). This EUA will remain  in effect (meaning this test can be used) for the duration of the COVID-19 declaration under Section 564(b)(1) of the Act, 21 U.S.C.section 360bbb-3(b)(1), unless the authorization is terminated  or revoked sooner.       Influenza A by PCR NEGATIVE NEGATIVE Final   Influenza B by PCR NEGATIVE NEGATIVE Final    Comment: (NOTE) The Xpert Xpress SARS-CoV-2/FLU/RSV plus assay is intended as an aid in the diagnosis of influenza from Nasopharyngeal swab specimens and should not be used as a sole basis for treatment. Nasal washings and aspirates are unacceptable for Xpert Xpress SARS-CoV-2/FLU/RSV testing.  Fact Sheet for Patients: EntrepreneurPulse.com.au  Fact Sheet for Healthcare Providers: IncredibleEmployment.be  This test is not yet approved or cleared by the [place] FDA and has been authorized for detection and/or diagnosis of SARS-CoV-2 by FDA under an Emergency Use Authorization (EUA). This EUA will remain in effect (meaning this test can be used) for the duration of the COVID-19 declaration under Section 564(b)(1) of the Act, 21 U.S.C. section 360bbb-3(b)(1), unless the authorization is terminated or revoked.  Performed at [facility]   Blood Culture (routine x 2)     Status: None (Preliminary result)   Collection Time: 12/18/20  5:38 PM   Specimen: BLOOD  Result Value Ref Range Status   Specimen Description BLOOD LEFT ANTECUBITAL  Final   Special Requests   Final    Blood Culture adequate volume BOTTLES DRAWN AEROBIC AND ANAEROBIC   Culture    Final    NO GROWTH 2 DAYS Performed at  [facility], [place] [address], [place], [place] [address]    Report Status PENDING  Incomplete  Culture, Urine     Status: None   Collection Time: 12/19/20 10:30 AM   Specimen: Urine, Random  Result Value Ref Range Status   Specimen Description   Final    URINE, RANDOM Performed at [facility] [address] [place], [place] [address]    Special Requests   Final    NONE Performed at [facility], [place] [address], [place], [place] [address]    Culture   Final    NO GROWTH Performed at [facility], [place] [address], [place], [place] [address]    Report Status 12/20/2020 FINAL  Final     Studies: No results found.   Flora Lipps, MD  [facility] 12/21/2020  If 7PM-7AM, please contact night-coverage

## 2020-12-21 NOTE — NC FL2 (Signed)
[place] LEVEL OF CARE SCREENING TOOL     IDENTIFICATION  Patient Name: Sean [name] Birthdate: [birth year] Sex: male Admission Date (Current Location): 12/18/2020  [facility] and [place] Number:  Herbalist and Address:  [facility],  [place], [place]      Provider Number: [id number]  Attending Physician Name and Address:  Flora Lipps, MD  Relative Name and Phone Number:  Graysin, Luczynski [phone]    Current Level of Care: Hospital Recommended Level of Care: [place] Prior Approval Number:    Date Approved/Denied:   PASRR Number: [id number]  Discharge Plan: SNF    Current Diagnoses: Patient Active Problem List   Diagnosis Date Noted  . Malnutrition of moderate degree 12/21/2020  . Severe sepsis ([place]) 12/19/2020  . Pneumonia 12/18/2020  . Malignant melanoma ([place]) 10/26/2020  . Brain metastasis ([place]) 10/20/2020  . Melanoma of skin ([place]) 10/05/2020  . Status post craniotomy 09/29/2020  . Brain tumor ([place]) 09/29/2020  . Elevated blood pressure reading 09/15/2020  . Malignant melanoma metastatic to brain ([place]) 09/15/2020  . Uncomplicated alcohol dependence ([place])   . Seizure ([place])   . Hypokalemia   . Hyponatremia   . Elevated LFTs   . Brain metastases ([place]) 09/14/2020    Orientation RESPIRATION BLADDER Height & Weight     Self  Normal Incontinent Weight: 174 lb 13.2 oz (79.3 kg) Height:  6' (182.9 cm)  BEHAVIORAL SYMPTOMS/MOOD NEUROLOGICAL BOWEL NUTRITION STATUS      Incontinent Diet (Regular)  AMBULATORY STATUS COMMUNICATION OF NEEDS Skin   Limited Assist Verbally Surgical wounds                       Personal Care Assistance Level of Assistance  Bathing,Feeding,Dressing Bathing Assistance: Limited assistance Feeding assistance: Independent Dressing Assistance: Limited assistance     Functional Limitations Info  Sight,Hearing,Speech Sight  Info: Adequate Hearing Info: Adequate Speech Info: Adequate    SPECIAL CARE FACTORS FREQUENCY  PT (By licensed PT),OT (By licensed OT)     PT Frequency: Minimum 5x a week OT Frequency: Minimum 5x a week            Contractures Contractures Info: Not present    Additional Factors Info  Code Status,Psychotropic,Allergies Code Status Info: Partial Allergies Info: No Known Allergies Psychotropic Info: citalopram (CELEXA) tablet 10 mg and methylphenidate (RITALIN) tablet 5 mg         Current Medications (12/21/2020):  This is the current hospital active medication list Current Facility-Administered Medications  Medication Dose Route Frequency Provider Last Rate Last Admin  . 0.9 %  sodium chloride infusion   Intravenous Continuous Pokhrel, Laxman, MD 100 mL/hr at 12/21/20 1824 New Bag at 12/21/20 1824  . azithromycin (ZITHROMAX) 500 mg in sodium chloride 0.9 % 250 mL IVPB  500 mg Intravenous Q24H Pokhrel, Laxman, MD 250 mL/hr at 12/21/20 1521 500 mg at 12/21/20 1521  . cefTRIAXone (ROCEPHIN) 1 g in sodium chloride 0.9 % 100 mL IVPB  1 g Intravenous Q24H Wendee Beavers T, MD 200 mL/hr at 12/21/20 1735 1 g at 12/21/20 1735  . citalopram (CELEXA) tablet 10 mg  10 mg Oral QHS Wendee Beavers T, MD   10 mg at 12/20/20 2134  . dexamethasone (DECADRON) tablet 2 mg  2 mg Oral Daily Wendee Beavers T, MD   2 mg at 12/21/20 1000  . feeding supplement (ENSURE ENLIVE / ENSURE PLUS) liquid 237 mL  237 mL Oral TID BM Pokhrel, Laxman, MD   237 mL at 12/21/20 1251  . levETIRAcetam (KEPPRA) tablet 500 mg  500 mg Oral BID Wendee Beavers T, MD   500 mg at 12/21/20 1000  . melatonin tablet 5 mg  5 mg Oral QHS PRN Wendee Beavers T, MD   5 mg at 12/20/20 2134  . methylphenidate (RITALIN) tablet 5 mg  5 mg Oral BID WC Wendee Beavers T, MD   5 mg at 12/21/20 1136  . multivitamin with minerals tablet 1 tablet  1 tablet Oral Daily Mercy Riding, MD   1 tablet at 12/21/20 1001  . ondansetron (ZOFRAN) tablet 4 mg  4 mg Oral Q6H  PRN Gonfa, Taye T, MD       Or  . ondansetron (ZOFRAN) injection 4 mg  4 mg Intravenous Q6H PRN Gonfa, Taye T, MD      . pantoprazole (PROTONIX) EC tablet 40 mg  40 mg Oral Daily Gonfa, Taye T, MD   40 mg at 12/21/20 1000  . senna-docusate (Senokot-S) tablet 2 tablet  2 tablet Oral QHS Mercy Riding, MD   2 tablet at 12/20/20 2134     Discharge Medications: Please see discharge summary for a list of discharge medications.  Relevant Imaging Results:  Relevant Lab Results:   Additional Information SSN [id number]  Ross Ludwig, LCSW

## 2020-12-21 NOTE — Progress Notes (Addendum)
Occupational Therapy Treatment Patient Details Name: Sean [name] MRN: [id number] DOB: [birth year] Today's Date: 12/21/2020    History of present illness Sean [name] is a 55 y.o. male.  Presents to ER with concern for fever.following cancer treatment on 12/18/20.Patient has a history of metastatic melanoma.  Extensive brain mets. Patient has been at [facility] for the past 17 days. per wife, plans to return per CM note.   OT comments  Nursing reporting that patient needing total assist to feed. Therapist used treatment time to assess patient's ability to self feed and continue to observe patient's cognitive impairments to understand how best to use cues to promote participation in self care tasks. Patient needs verbal and tactile cues to perform partial feeding. Able to hold magic cup and feed himself 3-4 spoonfuls before sitting it down - needing tactile cues of spoon and cup placed in hand. Initially he just turned cup around in his hand without verbal cue. Able to drink a sip of tea without cue and on his own. With attempt to meat on tray - needing verbal and tactile cue but needed prolonged time to bring to mouth x 1 and chew. Did not attempt to try food again. Did not attempt magic cup despite cues. With verbal and visual cue, cup placed in front of him to grasp, patient feed himself half cup of peaches. Therapist fed patient twice - and both times patient opened mouth and accepted food - though he did not initiate task to feed himself. Patient was able to open cracker package and fed himself a part of the cracker. Appears that self feeding could somewhat be due to lack of interest of food in front of him. He demonstrates the physical abilities to perform needing constant multimodal cues to perform. Recommend full supervision/assistance to feed at all meals though recommended attempts to allow patient to perform/participate as well.   Follow Up Recommendations  SNF    Equipment  Recommendations  None recommended by OT    Recommendations for Other Services      Precautions / Restrictions Precautions Precautions: Fall Precaution Comments: cognitive deficits, condom cath, L sided weakness Restrictions Weight Bearing Restrictions: No       Mobility Bed Mobility Overal bed mobility: Needs Assistance Bed Mobility: Supine to Sit;Sit to Supine     Supine to sit: Supervision;HOB elevated Sit to supine: Supervision;HOB elevated   General bed mobility comments: verbal cues to perform supine to sit. Once in sitting only maintained position for approx 1 minute then returned himself to supine.    Transfers                      Balance   Sitting-balance support: No upper extremity supported;Feet supported Sitting balance-Leahy Scale: Fair                                     ADL either performed or assessed with clinical judgement   ADL Overall ADL's : Needs assistance/impaired Eating/Feeding: Set up;Sitting;Cueing for sequencing;Supervision/ safety Eating/Feeding Details (indicate cue type and reason): Supervision, setup, some tactile cueing to feed self.                                         Vision Patient Visual Report: No change from baseline     Perception  Praxis      Cognition Arousal/Alertness: Awake/alert Behavior During Therapy: WFL for tasks assessed/performed Overall Cognitive Status: History of cognitive impairments - at baseline                         Following Commands: Follows one step commands inconsistently       General Comments: Today able to follow commands. Exhibits difficulty motor planning and initiating 50% of the time. Able to perform with verbal and tactile cues.        Exercises     Shoulder Instructions       General Comments      Pertinent Vitals/ Pain       Pain Assessment: No/denies pain  Home Living                                           Prior Functioning/Environment              Frequency  Min 2X/week        Progress Toward Goals  OT Goals(current goals can now be found in the care plan section)  Progress towards OT goals: OT to reassess next treatment  Acute Rehab OT Goals OT Goal Formulation: Patient unable to participate in goal setting Time For Goal Achievement: 01/03/21 Potential to Achieve Goals: [place] Discharge plan remains appropriate    Co-evaluation          OT goals addressed during session: ADL's and self-care      AM-PAC OT "6 Clicks" Daily Activity     Outcome Measure   Help from another person eating meals?: A Little Help from another person taking care of personal grooming?: A Little Help from another person toileting, which includes using toliet, bedpan, or urinal?: A Lot Help from another person bathing (including washing, rinsing, drying)?: A Lot Help from another person to put on and taking off regular upper body clothing?: A Little Help from another person to put on and taking off regular lower body clothing?: A Lot 6 Click Score: 15    End of Session Equipment Utilized During Treatment: Gait belt;Rolling walker  OT Visit Diagnosis: Other abnormalities of gait and mobility (R26.89);Muscle weakness (generalized) (M62.81);Other symptoms and signs involving cognitive function;Other symptoms and signs involving the nervous system (R29.898)   Activity Tolerance Patient tolerated treatment well   Patient Left in chair;with call bell/phone within reach;with chair alarm set   Nurse Communication Mobility status        Time: 1359-1420 OT Time Calculation (min): 21 min  Charges: OT General Charges $OT Visit: 1 Visit OT Treatments $Self Care/Home Management : 8-22 mins  Derl Barrow, OTR/L [place]  Office [phone] Pager: [place] 12/21/2020, 3:31 PM

## 2020-12-21 NOTE — Progress Notes (Signed)
Physical Therapy Treatment Patient Details Name: Jamez Ambrocio MRN: [id number] DOB: 03/28/1966 Today's Date: 12/21/2020    History of Present Illness Neeraj Housand is a 55 y.o. male.  Presents to ER with concern for fever.following cancer treatment on 12/18/20.Patient has a history of metastatic melanoma.  Extensive brain mets. Patient has been at [facility] for the past 17 days. per wife, plans to return per CM note.    PT Comments    General Comments: awake and following repeat simple commands inconsistantly.  at times difficult to sustain attention as well as process and deliver using both verbal and tactile cueing without "overloading". Assisted OOB to attempt amb with + 3 assist.  General bed mobility comments: verbal cues to perform supine to sit. Able to static sit EOB with supervision but impulsive to stand or "throw" himself back onto bed. General transfer comment: 100% simple short functional VC's with inconsistant results.  Required increased time and repeat instructions to "stand up" or "sit here".  General Gait Details: VERY unsteady gait requiring + 2 side by side assist with hand over hand tactile cueing to "grip" the walker handles.  Pt was unable to self advance walker so therapist "pulled" walker forward as pt initiated steppage gait.  Poor dynamic balance and poor self correction.  Recliner following with a third person as precaution. Assisted back to bed.   Pt plans to return to [place] to resume his Rehab.    Follow Up Recommendations  SNF     Equipment Recommendations  None recommended by PT    Recommendations for Other Services       Precautions / Restrictions Precautions Precautions: Fall Precaution Comments: cognitive deficits, L sided weakness Restrictions Weight Bearing Restrictions: No    Mobility  Bed Mobility Overal bed mobility: Needs Assistance Bed Mobility: Supine to Sit;Sit to Supine     Supine to sit: Min assist Sit to supine: Min  assist;Mod assist   General bed mobility comments: verbal cues to perform supine to sit. Able to static sit EOB with supervision but impulsive to stand or "throw" himself back onto bed.    Transfers Overall transfer level: Needs assistance Equipment used: Rolling walker (2 wheeled) Transfers: Sit to/from [facility] Sit to Stand: Min assist Stand pivot transfers: Min assist;Mod assist       General transfer comment: 100% simple short functional VC's with inconsistant results.  Required increased time and repeat instructions to "stand up" or "sit here"  Ambulation/Gait Ambulation/Gait assistance: Mod assist;+2 physical assistance;+2 safety/equipment Gait Distance (Feet): 24 Feet Assistive device: Rolling walker (2 wheeled) Gait Pattern/deviations: Step-to pattern;Decreased stride length;Antalgic;Ataxic;Narrow base of support Gait velocity: decreased   General Gait Details: VERY unsteady gait requiring + 2 side by side assist with hand over hand tactile cueing to "grip" the walker handles.  Pt was unable to self advance walker so therapist "pulled" walker forward as pt initiated steppage gait.  Poor dynamic balance and poor self correction.  Recliner following with a third person as precaution.   Stairs             Wheelchair Mobility    Modified Rankin (Stroke Patients Only)       Balance   Sitting-balance support: No upper extremity supported;Feet supported Sitting balance-Leahy Scale: [place]  Arousal/Alertness: Awake/alert Behavior During Therapy: WFL for tasks assessed/performed Overall Cognitive Status: History of cognitive impairments - at baseline                         Following Commands: Follows one step commands inconsistently       General Comments: awake and following repeat simple commands inconsistantly.  at times difficult to sustain attention as well as process  and deliver using both verbal and tactile cueing without "overloading".      Exercises      General Comments        Pertinent Vitals/Pain Pain Assessment: No/denies pain    Home Living                      Prior Function            PT Goals (current goals can now be found in the care plan section) Progress towards PT goals: Progressing toward goals    Frequency    Min 2X/week      PT Plan Current plan remains appropriate    Co-evaluation       OT goals addressed during session: ADL's and self-care      AM-PAC PT "6 Clicks" Mobility   Outcome Measure  Help needed turning from your back to your side while in a flat bed without using bedrails?: A Lot Help needed moving from lying on your back to sitting on the side of a flat bed without using bedrails?: A Lot Help needed moving to and from a bed to a chair (including a wheelchair)?: Total Help needed standing up from a chair using your arms (e.g., wheelchair or bedside chair)?: Total Help needed to walk in hospital room?: Total Help needed climbing 3-5 steps with a railing? : Total 6 Click Score: 8    End of Session Equipment Utilized During Treatment: Gait belt Activity Tolerance: Patient tolerated treatment well Patient left: in bed;with call bell/phone within reach;with bed alarm set Nurse Communication: Mobility status PT Visit Diagnosis: Other symptoms and signs involving the nervous system (R29.898);Unsteadiness on feet (R26.81);Hemiplegia and hemiparesis Hemiplegia - Right/Left: Left Hemiplegia - dominant/non-dominant: Dominant Hemiplegia - caused by: Unspecified     Time: 2751-7001 PT Time Calculation (min) (ACUTE ONLY): 29 min  Charges:  $Gait Training: 8-22 mins $Therapeutic Activity: 8-22 mins                     Rica Koyanagi  PTA Acute  Rehabilitation Services Pager      [phone] Office      [phone]

## 2020-12-21 NOTE — TOC Progression Note (Signed)
Transition of Care [facility]) - Progression Note    Patient Details  Name: Sean [name] MRN: [id number] Date of Birth: [birth year]  [facility] CM/SW Contact  Ross Ludwig, [place] Phone Number: 12/21/2020, 6:35 PM  Clinical Narrative:     Patient's clinicals faxed out to SNFs, patient potentially plans to return back to [facility].  Insurance authorization will have to be restarted once patient is close to being medically ready for discharge.  CSW to continue to follow patient's progress throughout discharge planning.  Expected Discharge Plan: [place] Barriers to Discharge: Continued Medical Work up  Expected Discharge Plan and Services Expected Discharge Plan: [place]   Discharge Planning Services: CM Consult Post Acute Care Choice: Resumption of Svcs/PTA Provider Living arrangements for the past 2 months: Single Family Home,Skilled Nursing Facility                                       Social Determinants of Health (SDOH) Interventions    Readmission Risk Interventions No flowsheet data found.

## 2020-12-21 NOTE — Progress Notes (Signed)
IP PROGRESS NOTE  Subjective:   Mr. Klecka appears unchanged.  He has no complaint.  He denies pain.  Objective: Vital signs in last 24 hours: Blood pressure 107/73, pulse (!) 101, temperature 99.1 F (37.3 C), resp. rate 18, height 6' (1.829 m), weight 174 lb 13.2 oz (79.3 kg), SpO2 98 %.  Intake/Output from previous day: 04/06 0701 - 04/07 0700 In: 543.2 [P.O.:120; I.V.:423.2] Out: 301 [Urine:300; Stool:1]  Physical Exam:  HEENT: No thrush  Abdomen: Nontender, no hepatosplenomegaly Extremities: No leg edema Skin: No rash Neurologic: Alert, follows simple commands, moves extremities to command    Lab Results: Recent Labs    12/20/20 0438 12/21/20 0421  WBC 7.8 8.2  HGB 13.1 10.7*  HCT 40.2 32.9*  PLT 230 205    BMET Recent Labs    12/20/20 0438 12/21/20 0421  NA 148* 145  K 4.1 3.6  CL 112* 113*  CO2 26 24  GLUCOSE 117* 104*  BUN 16 14  CREATININE 1.53* 1.51*  CALCIUM 8.4* 8.0*    Lab Results  Component Value Date   CEA1 0.6 09/15/2020    Medications: I have reviewed the patient's current medications.  Assessment/Plan:  1. 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, unknown primary tumor site  CT brain 09/14/2020-multiple hemorrhagic metastases with surrounding edema in the bilateral cerebral hemispheres  CTs chest, abdomen, pelvis 09/14/2020-1.4 cm right upper lobe subpleural nodule, 0.5 cm right lower lobe nodule, multiple hypodensities in the liver suspicious for metastases, 1.5 cm right upper pole hypodense kidney lesion-indeterminate  MRI abdomen 09/15/2020-multifocal T1 hyperintense liver lesions, no kidney mass  Ultrasound-guided biopsy of a segment 4B liver lesion 09/18/2020-benign liver parenchyma with steatosis, no evidence of malignancy  PET 09/25/2020-focal area of hypermetabolism in the right hilum, multiple areas of the liver, pelvic musculature, and left thigh-no CT correlate with any of these areas on  the noncontrast CT, 10 mm anterior right upper lobe nodule with low-level hypermetabolism, additional tiny pulmonary nodules too small to characterize by PET  SRS to 10 brain lesions 09/27/2020  Resection of left occipital and left frontal lesions on 09/29/2020-pathology consistent with malignant melanoma  Cycle 1 ipilimumab/nivolumab 10/16/2020  CT of the brain without contrast 10/26/2020 showed significant interval increase in the number and size of numerous metastases  Brain MRI 10/24/2020-multiple new and increased brain metastases, moderate edema surrounding multiple lesions, new mass-effect at the right lateral ventricle  Brain MRI 11/01/2020-stable appearance of his multiple intracranial metastases and associated edema and mild mass-effect.  CT of the chest with contrast 11/01/2020-interval progression of bilateral pulmonary nodules concerning for metastatic disease, small right hilar lymph nodes, new and progressive ill-defined lesions in the liver compatible with progression of metastatic involvement.  Cycle 2 nivolumab 11/10/2020  Cycle 3 nivolumab 11/27/2020  MRI brain 12/13/2020-decrease in size of hemorrhagic brain metastases, improvement in vasogenic edema and mass-effect, new areas of nonhemorrhagic enhancement by frontal and right parietal white matter-nonspecific  CT chest 12/08/2020-decreased size of lung and liver metastases  Cycle 4 nivolumab 12/18/2020 2.Left-sided weakness, new onset seizure secondary to #1, weakness has resolved 3.Hyperlipidemia 4.Elevated liver enzymes and bilirubin  Liver enzymes persistently elevated 5.Headache and nausea/vomiting secondary to #1, resolved 6. Hospital admission 10/20/2020-encephalopathy, slow Decadron taper-down to 2 mg at discharge 11/09/2020 7.  Admission 12/18/2020 with fever  [name] appears unchanged.  His neurologic status appears at baseline over the past several weeks..  The creatinine and liver enzymes are more  elevated this week.  This could  be related to toxicity from nivolumab or infection.  The liver enzymes have improved today.  However the liver enzymes were significantly elevated prior to beginning treatment in January.  He has an incurable malignancy.  There has been improvement of the metastatic melanoma on recent imaging studies.  I support him remaining on a no CODE BLUE status.   He is now afebrile.  1 blood culture from admission returned positive for staph epidermidis, likely contaminant.  Recommendations: 1.  Continue OT/PT, return to the skilled nursing facility at discharge 2.  Antibiotics per the medical service 3.  Continue Decadron and Keppra 4.  Follow-up as scheduled at the Cancer center for the next cycle of nivolumab on 01/01/2021.  5.  Please call oncology as needed   LOS: 2 days   Betsy Coder, MD   12/21/2020, 1:12 PM

## 2020-12-22 DIAGNOSIS — R7989 Other specified abnormal findings of blood chemistry: Secondary | ICD-10-CM | POA: Diagnosis not present

## 2020-12-22 DIAGNOSIS — J189 Pneumonia, unspecified organism: Secondary | ICD-10-CM | POA: Diagnosis not present

## 2020-12-22 DIAGNOSIS — A419 Sepsis, unspecified organism: Secondary | ICD-10-CM | POA: Diagnosis not present

## 2020-12-22 DIAGNOSIS — C7931 Secondary malignant neoplasm of brain: Secondary | ICD-10-CM | POA: Diagnosis not present

## 2020-12-22 LAB — BASIC METABOLIC PANEL WITH GFR
Anion gap: 7 (ref 5–15)
BUN: 16 mg/dL (ref 6–20)
CO2: 24 mmol/L (ref 22–32)
Calcium: 7.8 mg/dL — ABNORMAL LOW (ref 8.9–10.3)
Chloride: 115 mmol/L — ABNORMAL HIGH (ref 98–111)
Creatinine, Ser: 1.61 mg/dL — ABNORMAL HIGH (ref 0.61–1.24)
GFR, Estimated: 50 mL/min — ABNORMAL LOW
Glucose, Bld: 110 mg/dL — ABNORMAL HIGH (ref 70–99)
Potassium: 3.3 mmol/L — ABNORMAL LOW (ref 3.5–5.1)
Sodium: 146 mmol/L — ABNORMAL HIGH (ref 135–145)

## 2020-12-22 MED ORDER — SODIUM CHLORIDE 0.45 % IV SOLN: INTRAVENOUS | Status: DC

## 2020-12-22 MED ORDER — POTASSIUM CHLORIDE CRYS ER 20 MEQ PO TBCR
40.0000 meq | EXTENDED_RELEASE_TABLET | Freq: Two times a day (BID) | ORAL | Status: AC
  Administered 2020-12-22 – 2020-12-23 (×2): 40 meq via ORAL
  Filled 2020-12-22 (×2): qty 2

## 2020-12-22 NOTE — Progress Notes (Signed)
PROGRESS NOTE  Sean [name] BOF:[id number] DOB: [birth year] DOA: 12/18/2020 PCP: [name], MD   LOS: 3 days   Brief narrative: Sean [name] is a 55 y.o. 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  Patient does have baseline cognitive dysfunction due to metastatic brain disease and is only oriented to self and place.  History was obtained from the patient's family who reported a fever of 101.4 F with increased breathing.  In the ED patient was noted to be slightly tachycardic and had mild hypotension.  Laboratory data showed normal leukocyte count but lactate was elevated at 2.4 which trended up to 2.7.  COVID-19 and influenza test was negative.  Chest x-ray was reported as left lower lobe airspace opacity.  In the ED, CT head was performed which showed stable but extensive hemorrhagic metastasis.  Blood cultures were drawn from the ED.  Patient received 2 L of lactated ringer, Rocephin and Zithromax and was admitted to hospital for further evaluation and treatment.  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  Oncology has seen the patient.  Patient is on immunotherapy as outpatient.  One of the blood cultures with staph epidermidis likely contaminant.  Currently on continue IV Rocephin and Zithromax, plan to complete a 5-day course.  Lactate has normalized at this time.  Metastatic melanoma-with extensive  mets including brain.   S/p craniotomy and resection of left occipital and left frontal lesion in 09/2020.  CT head shows stable extensive hemorrhagic mets.    Followed by Dr [name] oncology.  Continue dexamethasone and Keppra from home.    Acute kidney injury  Continue to monitor.  On IV fluids.  Baseline creatinine of around 0.6-0.7.  Continue with IV fluids for now.  Avoid nephrotoxic medications.  Creatinine of 1.6 today.  This has plateaued at this time.  Will need outpatient follow-up on discharge.  Patient has good urinary output.  Lab Results  Component Value Date   CREATININE 1.51 (H) 12/21/2020   CREATININE 1.53 (H) 12/20/2020   CREATININE 1.46 (H) 12/19/2020   History of seizure due to brain mets Continue Keppra.    Elevated LFTs.  Likely secondary to metastatic disease. Hepatitis panel was nonreactive.  Hypokalemia: We will continue to replenish closely.  Potassium 3.3 today.  Add p.o. potassium twice daily x3.  Mild hypernatremia.  Will change fluids to half-normal saline.  Cognitive impairment likely from brain mets.   Alert, awake and oriented to person and self.  Likely at baseline at this time.  Generalized weakness/debility Continue physical therapy, occupational therapy evaluation.  Patient is currently at the skilled nursing facility.  DVT prophylaxis: SCDs Start: 12/19/20 7782   Code Status: Partial code   Family Communication:  I spoke with the patient's brother at bedside today.I spoke with the patient's wife on the phone yesterday.  Status is: Inpatient  Remains inpatient appropriate because:IV treatments appropriate due to intensity of illness or inability to take PO and Inpatient level of care  appropriate due to severity of illness  Dispo: The patient is from: SNF              Anticipated d/c is to: SNF, pending placement.              Patient currently is  medically stable to d/c.   Difficult to place patient No  Consultants:  Medical  oncology  Procedures:  None  Anti-infectives:  Marland Kitchen Rocephin and Zithromax 4/4>  Anti-infectives (From admission, onward)   Start     Dose/Rate Route Frequency Ordered Stop   12/19/20 1800  cefTRIAXone (ROCEPHIN) 1 g in sodium chloride 0.9 % 100 mL IVPB        1 g 200 mL/hr over 30 Minutes Intravenous Every 24 hours 12/19/20 0715 12/23/20 1759   12/19/20 1500  azithromycin (ZITHROMAX) 500 mg in sodium chloride 0.9 % 250 mL IVPB        500 mg 250 mL/hr over 60 Minutes Intravenous Every 24 hours 12/19/20 1355 12/24/20 1459   12/18/20 2015  cefTRIAXone (ROCEPHIN) 1 g in sodium chloride 0.9 % 100 mL IVPB        1 g 200 mL/hr over 30 Minutes Intravenous  Once 12/18/20 2011 12/18/20 2125   12/18/20 2015  azithromycin (ZITHROMAX) 500 mg in sodium chloride 0.9 % 250 mL IVPB  Status:  Discontinued        500 mg 250 mL/hr over 60 Minutes Intravenous  Once 12/18/20 2011 12/19/20 1355     Subjective: Today, patient was seen and examined at bedside.  Patient complains of mild nausea with his brother.  Denies any shortness of breath cough, fever, chills headache or vomiting.  Objective: Vitals:   12/21/20 2133 12/22/20 0549  BP: 116/77 126/78  Pulse: 84 86  Resp: 16 18  Temp: 98.2 F (36.8 C) 98.1 F (36.7 C)  SpO2: 99% 100%    Intake/Output Summary (Last 24 hours) at 12/22/2020 0818 Last data filed at 12/22/2020 0700 Gross per 24 hour  Intake 1899.61 ml  Output 850 ml  Net 1049.61 ml   Filed Weights   12/18/20 1709 12/18/20 2230  Weight: 77.1 kg 79.3 kg   Body mass index is 23.71 kg/m.   Physical Exam: General:  Average built, not in obvious distress, mild cognitive dysfunction oriented to self and person. HENT:   No scleral pallor or icterus noted. Oral mucosa is moist.  Chest:  .  Diminished breath sounds bilaterally.  CVS: S1 &S2 heard. No murmur.  Regular rate and rhythm. Abdomen: Soft, nontender, nondistended.  Bowel sounds are heard.   Extremities: No cyanosis, clubbing  or edema.  Peripheral pulses are palpable. Psych: Alert, awake and communicative, cognitive dysfunction at baseline, oriented to self. CNS:  No cranial nerve deficits.  Moves all extremities. Skin: Warm and dry.  No rashes noted.   Data Review: I have personally reviewed the following laboratory data and studies,  CBC: Recent Labs  Lab 12/18/20 1736 12/19/20 1133 12/20/20 0438 12/21/20 0421  WBC 9.8 7.8 7.8 8.2  NEUTROABS 7.6 5.9 6.2  --   HGB 13.9 13.1 13.1 10.7*  HCT 41.4 39.5 40.2 32.9*  MCV 93.0 95.9 97.3 96.2  PLT 237 246 230 625   Basic Metabolic Panel: Recent Labs  Lab 12/18/20 1038 12/18/20 1736 12/19/20 1133 12/20/20 0438 12/21/20 0421  NA 143 144 145 148* 145  K 3.5 3.8 3.4* 4.1 3.6  CL 107 106 108 112* 113*  CO2 24 27 27 26  24  GLUCOSE 126* 116* 131* 117* 104*  BUN 11 14 13 16 14   CREATININE 1.01 1.23 1.46* 1.53* 1.51*  CALCIUM 8.9 8.9 8.5* 8.4* 8.0*  MG  --   --  1.8 2.0 1.7  PHOS  --   --  3.9 3.4 2.7   Liver Function Tests: Recent Labs  Lab 12/18/20 1038 12/18/20 1736 12/19/20 1133 12/20/20 0438 12/21/20 0421  AST 53* 61* 135* 120* 59*  ALT 131* 138* 222* 231* 156*  ALKPHOS 118 123 138* 139* 119  BILITOT 1.6* 1.5* 1.8* 1.0 0.6  PROT 6.4* 6.4* 6.2* 6.2* 5.2*  ALBUMIN 3.8 3.7 3.2* 3.2* 2.4*   No results for input(s): LIPASE, AMYLASE in the last 168 hours. Recent Labs  Lab 12/20/20 0438  AMMONIA 10   Cardiac Enzymes: No results for input(s): CKTOTAL, CKMB, CKMBINDEX, TROPONINI in the last 168 hours. BNP (last 3 results) No results for input(s): BNP in the last 8760 hours.  ProBNP (last 3 results) No results for input(s): PROBNP in the last 8760 hours.  CBG: No results for input(s): GLUCAP in the last 168 hours. Recent Results (from the past 240 hour(s))  Blood Culture (routine x 2)     Status: Abnormal   Collection Time: 12/18/20  5:10 PM   Specimen: BLOOD RIGHT FOREARM  Result Value Ref Range Status   Specimen Description    Final    BLOOD RIGHT FOREARM Performed at [facility], [address], [place], [place] [address]    Special Requests   Final    BOTTLES DRAWN AEROBIC ONLY Blood Culture results may not be optimal due to an inadequate volume of blood received in culture bottles Performed at [facility]    Culture  Setup Time   Final    AEROBIC BOTTLE ONLY GRAM POSITIVE COCCI CRITICAL RESULT CALLED TO, READ BACK BY AND VERIFIED WITH: E JACKSON PHARMD 12/20/20 0301 JDW    Culture (A)  Final    STAPHYLOCOCCUS EPIDERMIDIS THE SIGNIFICANCE OF ISOLATING THIS ORGANISM FROM A SINGLE SET OF BLOOD CULTURES WHEN MULTIPLE SETS ARE DRAWN IS UNCERTAIN. PLEASE NOTIFY THE MICROBIOLOGY DEPARTMENT WITHIN ONE WEEK IF SPECIATION AND SENSITIVITIES ARE REQUIRED. Performed at [facility], [place] [address], [place], [place] [address]    Report Status 12/21/2020 FINAL  Final  Blood Culture ID Panel (Reflexed)     Status: Abnormal   Collection Time: 12/18/20  5:10 PM  Result Value Ref Range Status   Enterococcus faecalis NOT DETECTED NOT DETECTED Final   Enterococcus Faecium NOT DETECTED NOT DETECTED Final   Listeria monocytogenes NOT DETECTED NOT DETECTED Final   Staphylococcus species DETECTED (A) NOT DETECTED Final    Comment: CRITICAL RESULT CALLED TO, READ BACK BY AND VERIFIED WITH: E JACKSON PHARMD 12/20/20 0301 JDW    Staphylococcus aureus (BCID) NOT DETECTED NOT DETECTED Final   Staphylococcus epidermidis DETECTED (A) NOT DETECTED Final    Comment: CRITICAL RESULT CALLED TO, READ BACK BY AND VERIFIED WITH: E JACKSON PHARMD 12/20/20 0301 JDW    Staphylococcus lugdunensis NOT DETECTED NOT DETECTED Final   Streptococcus species NOT DETECTED NOT DETECTED Final   Streptococcus agalactiae NOT DETECTED NOT DETECTED Final   Streptococcus pneumoniae NOT DETECTED NOT DETECTED Final   Streptococcus pyogenes NOT DETECTED NOT DETECTED Final   A.calcoaceticus-baumannii NOT DETECTED NOT DETECTED Final    Bacteroides fragilis NOT DETECTED NOT DETECTED Final   Enterobacterales NOT DETECTED NOT DETECTED Final   Enterobacter cloacae complex NOT DETECTED NOT  DETECTED Final   Escherichia coli NOT DETECTED NOT DETECTED Final   Klebsiella aerogenes NOT DETECTED NOT DETECTED Final   Klebsiella oxytoca NOT DETECTED NOT DETECTED Final   Klebsiella pneumoniae NOT DETECTED NOT DETECTED Final   Proteus species NOT DETECTED NOT DETECTED Final   Salmonella species NOT DETECTED NOT DETECTED Final   Serratia marcescens NOT DETECTED NOT DETECTED Final   Haemophilus influenzae NOT DETECTED NOT DETECTED Final   Neisseria meningitidis NOT DETECTED NOT DETECTED Final   Pseudomonas aeruginosa NOT DETECTED NOT DETECTED Final   Stenotrophomonas maltophilia NOT DETECTED NOT DETECTED Final   Candida albicans NOT DETECTED NOT DETECTED Final   Candida auris NOT DETECTED NOT DETECTED Final   Candida glabrata NOT DETECTED NOT DETECTED Final   Candida krusei NOT DETECTED NOT DETECTED Final   Candida parapsilosis NOT DETECTED NOT DETECTED Final   Candida tropicalis NOT DETECTED NOT DETECTED Final   Cryptococcus neoformans/gattii NOT DETECTED NOT DETECTED Final   Methicillin resistance mecA/C NOT DETECTED NOT DETECTED Final    Comment: Performed at [facility], [place] [address], [place], [place] [address]  Resp Panel by RT-PCR (Flu A&B, Covid) Nasopharyngeal Swab     Status: None   Collection Time: 12/18/20  5:36 PM   Specimen: Nasopharyngeal Swab; Nasopharyngeal(NP) swabs in vial transport medium  Result Value Ref Range Status   SARS Coronavirus 2 by RT PCR NEGATIVE NEGATIVE Final    Comment: (NOTE) SARS-CoV-2 target nucleic acids are NOT DETECTED.  The SARS-CoV-2 RNA is generally detectable in upper respiratory specimens during the acute phase of infection. The lowest concentration of SARS-CoV-2 viral copies this assay can detect is 138 copies/mL. A negative result does not preclude SARS-Cov-2 infection and  should not be used as the sole basis for treatment or other patient management decisions. A negative result may occur with  improper specimen collection/handling, submission of specimen other than nasopharyngeal swab, presence of viral mutation(s) within the areas targeted by this assay, and inadequate number of viral copies(<138 copies/mL). A negative result must be combined with clinical observations, patient history, and epidemiological information. The expected result is Negative.  Fact Sheet for Patients:  EntrepreneurPulse.com.au  Fact Sheet for Healthcare Providers:  IncredibleEmployment.be  This test is no t yet approved or cleared by the [place] FDA and  has been authorized for detection and/or diagnosis of SARS-CoV-2 by FDA under an Emergency Use Authorization (EUA). This EUA will remain  in effect (meaning this test can be used) for the duration of the COVID-19 declaration under Section 564(b)(1) of the Act, 21 U.S.C.section 360bbb-3(b)(1), unless the authorization is terminated  or revoked sooner.       Influenza A by PCR NEGATIVE NEGATIVE Final   Influenza B by PCR NEGATIVE NEGATIVE Final    Comment: (NOTE) The Xpert Xpress SARS-CoV-2/FLU/RSV plus assay is intended as an aid in the diagnosis of influenza from Nasopharyngeal swab specimens and should not be used as a sole basis for treatment. Nasal washings and aspirates are unacceptable for Xpert Xpress SARS-CoV-2/FLU/RSV testing.  Fact Sheet for Patients: EntrepreneurPulse.com.au  Fact Sheet for Healthcare Providers: IncredibleEmployment.be  This test is not yet approved or cleared by the [place] FDA and has been authorized for detection and/or diagnosis of SARS-CoV-2 by FDA under an Emergency Use Authorization (EUA). This EUA will remain in effect (meaning this test can be used) for the duration of the COVID-19 declaration  under Section 564(b)(1) of the Act, 21 U.S.C. section 360bbb-3(b)(1), unless the authorization is  terminated or revoked.  Performed at [facility]   Blood Culture (routine x 2)     Status: None (Preliminary result)   Collection Time: 12/18/20  5:38 PM   Specimen: BLOOD  Result Value Ref Range Status   Specimen Description BLOOD LEFT ANTECUBITAL  Final   Special Requests   Final    Blood Culture adequate volume BOTTLES DRAWN AEROBIC AND ANAEROBIC   Culture   Final    NO GROWTH 3 DAYS Performed at [facility], [place] [address] [place], [place] [address]    Report Status PENDING  Incomplete  Culture, Urine     Status: None   Collection Time: 12/19/20 10:30 AM   Specimen: Urine, Random  Result Value Ref Range Status   Specimen Description   Final    URINE, RANDOM Performed at [facility] [address] [place], [place] [address]    Special Requests   Final    NONE Performed at [facility], [place] [address] [place], [place] [address]    Culture   Final    NO GROWTH Performed at [facility], [place] [address], [place], [place] [address]    Report Status 12/20/2020 FINAL  Final     Studies: No results found.   Flora Lipps, MD  [facility] 12/22/2020  If 7PM-7AM, please contact night-coverage

## 2020-12-22 NOTE — TOC Progression Note (Signed)
Transition of Care [facility]) - Progression Note    Patient Details  Name: Tracey Stewart MRN: [id number] Date of Birth: Jan 22, 1966  Transition of Care [facility]) CM/SW Contact  Candise Crabtree, Juliann Pulse, RN Phone Number: 12/22/2020, 11:51 AM  Clinical Narrative: Left message to confirm [place] [facility] will start auth-patient medically stable-await response back from [place].Noted covid is good from 4/4-4/11 neg.      Expected Discharge Plan: [place] Barriers to Discharge: Continued Medical Work up  Expected Discharge Plan and Services Expected Discharge Plan: [place]   Discharge Planning Services: CM Consult Post Acute Care Choice: Resumption of Svcs/PTA Provider Living arrangements for the past 2 months: Single Family Home,Skilled Nursing Facility                                       Social Determinants of Health (SDOH) Interventions    Readmission Risk Interventions No flowsheet data found.

## 2020-12-23 DIAGNOSIS — R7989 Other specified abnormal findings of blood chemistry: Secondary | ICD-10-CM | POA: Diagnosis not present

## 2020-12-23 DIAGNOSIS — J189 Pneumonia, unspecified organism: Secondary | ICD-10-CM | POA: Diagnosis not present

## 2020-12-23 DIAGNOSIS — C7931 Secondary malignant neoplasm of brain: Secondary | ICD-10-CM | POA: Diagnosis not present

## 2020-12-23 DIAGNOSIS — A419 Sepsis, unspecified organism: Secondary | ICD-10-CM | POA: Diagnosis not present

## 2020-12-23 LAB — CULTURE, BLOOD (ROUTINE X 2)
Culture: NO GROWTH
Special Requests: ADEQUATE

## 2020-12-23 LAB — PHOSPHORUS: Phosphorus: 3.3 mg/dL (ref 2.5–4.6)

## 2020-12-23 LAB — COMPREHENSIVE METABOLIC PANEL
ALT: 119 U/L — ABNORMAL HIGH (ref 0–44)
AST: 46 U/L — ABNORMAL HIGH (ref 15–41)
Albumin: 2.4 g/dL — ABNORMAL LOW (ref 3.5–5.0)
Alkaline Phosphatase: 124 U/L (ref 38–126)
Anion gap: 9 (ref 5–15)
BUN: 12 mg/dL (ref 6–20)
CO2: 20 mmol/L — ABNORMAL LOW (ref 22–32)
Calcium: 8 mg/dL — ABNORMAL LOW (ref 8.9–10.3)
Chloride: 116 mmol/L — ABNORMAL HIGH (ref 98–111)
Creatinine, Ser: 1.5 mg/dL — ABNORMAL HIGH (ref 0.61–1.24)
GFR, Estimated: 54 mL/min — ABNORMAL LOW (ref >60)
Glucose, Bld: 91 mg/dL (ref 70–99)
Potassium: 3.9 mmol/L (ref 3.5–5.1)
Sodium: 145 mmol/L (ref 135–145)
Total Bilirubin: 0.2 mg/dL — ABNORMAL LOW (ref 0.3–1.2)
Total Protein: 5.4 g/dL — ABNORMAL LOW (ref 6.5–8.1)

## 2020-12-23 LAB — CBC
HCT: 32.5 % — ABNORMAL LOW (ref 39.0–52.0)
Hemoglobin: 10.8 g/dL — ABNORMAL LOW (ref 13.0–17.0)
MCH: 31.6 pg (ref 26.0–34.0)
MCHC: 33.2 g/dL (ref 30.0–36.0)
MCV: 95 fL (ref 80.0–100.0)
Platelets: 212 10*3/uL (ref 150–400)
RBC: 3.42 MIL/uL — ABNORMAL LOW (ref 4.22–5.81)
RDW: 13.8 % (ref 11.5–15.5)
WBC: 9 10*3/uL (ref 4.0–10.5)
nRBC: 0 % (ref 0.0–0.2)

## 2020-12-23 LAB — MAGNESIUM: Magnesium: 1.7 mg/dL (ref 1.7–2.4)

## 2020-12-23 MED ORDER — HYDROCODONE-ACETAMINOPHEN 5-325 MG PO TABS
1.0000 | ORAL_TABLET | Freq: Once | ORAL | Status: AC
  Administered 2020-12-23: 1 via ORAL
  Filled 2020-12-23: qty 1

## 2020-12-23 NOTE — Progress Notes (Signed)
PROGRESS NOTE  Hasten Sweitzer ZYS:[id number] DOB: [birth year] DOA: 12/18/2020 PCP: [name], MD   LOS: 4 days   Brief narrative: Sean [name] is a 55 y.o. 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  Patient does have baseline cognitive dysfunction due to metastatic brain disease and is only oriented to self and place.  History was obtained from the patient's family who reported a fever of 101.4 F with increased breathing.  In the ED patient was noted to be slightly tachycardic and had mild hypotension.  Laboratory data showed normal leukocyte count but lactate was elevated at 2.4 which trended up to 2.7.  COVID-19 and influenza test was negative.  Chest x-ray was reported as left lower lobe airspace opacity.  In the ED, CT head was performed which showed stable but extensive hemorrhagic metastasis.  Blood cultures were drawn from the ED.  Patient received 2 L of lactated ringer, Rocephin and Zithromax and was admitted to hospital for further evaluation and treatment.  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  Oncology has seen the patient.  Patient is on immunotherapy as outpatient.  One of the blood cultures with staph epidermidis- likely contaminant.  Currently on continue IV Rocephin and Zithromax,  to complete a 5-day course.  Lactate has normalized at this time.  Metastatic melanoma-with extensive mets  including brain.   S/p craniotomy and resection of left occipital and left frontal lesion in 09/2020.  CT head shows stable extensive hemorrhagic mets.    Followed by Dr [name] oncology.  Continue dexamethasone and Keppra from home.    Acute kidney injury  Continue to monitor.  On IV fluids.  Baseline creatinine of around 0.6-0.7.  Continue with IV fluids for now.  Avoid nephrotoxic medications.  Creatinine of 1.5 today.  This has plateaued at this time.  Will need outpatient follow-up.  Patient with good urinary output.  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    Elevated LFTs.  Likely secondary to metastatic disease. Hepatitis panel was nonreactive.  Hypokalemia: We will continue to replenish closely.  Potassium 3.3 today.  Add p.o. potassium twice daily x3.  Mild hypernatremia.  Improved.  Sodium of 145 today.  On half-normal saline  Cognitive impairment likely from brain mets.   Alert, awake and oriented to person and self.  Likely at baseline at this time.  Generalized weakness/debility Continue physical therapy, occupational therapy evaluation.  Patient is currently at the skilled nursing facility.  DVT prophylaxis: SCDs Start: 12/19/20 9323   Code Status: Partial code   Family Communication:  None today.  Spoke with the brother at bedside yesterday.  Status is: Inpatient  Remains inpatient appropriate because:IV treatments appropriate due to intensity of illness or inability to take PO and Inpatient level of care appropriate due to severity of illness  Dispo: The patient is from: SNF              Anticipated d/c is to: SNF, pending placement.              Patient currently is  medically stable to d/c.   Difficult to place patient No  Consultants:  Medical  oncology  Procedures:  None  Anti-infectives:  Marland Kitchen Rocephin and Zithromax 4/4>  Anti-infectives (From admission, onward)   Start     Dose/Rate Route Frequency Ordered Stop   12/19/20 1800  cefTRIAXone (ROCEPHIN) 1 g in sodium chloride 0.9 % 100 mL IVPB        1 g 200 mL/hr over 30 Minutes Intravenous Every 24 hours 12/19/20 0715 12/22/20 2216   12/19/20 1500  azithromycin (ZITHROMAX) 500 mg in sodium chloride 0.9 % 250 mL IVPB        500 mg 250 mL/hr over 60 Minutes Intravenous Every 24 hours 12/19/20 1355 12/24/20 1459   12/18/20 2015  cefTRIAXone (ROCEPHIN) 1 g in sodium chloride 0.9 % 100 mL IVPB        1 g 200 mL/hr over 30 Minutes Intravenous  Once 12/18/20 2011 12/18/20 2125   12/18/20 2015  azithromycin (ZITHROMAX) 500 mg in sodium chloride 0.9 % 250 mL IVPB  Status:  Discontinued        500 mg 250 mL/hr over 60 Minutes Intravenous  Once 12/18/20 2011 12/19/20 1355     Subjective: Today, patient was seen and examined at bedside.  Patient denies any nausea vomiting fever chills or rigor.  Objective: Vitals:   12/22/20 2100 12/23/20 0701  BP: 109/74 103/66  Pulse: 72 75  Resp: 18 16  Temp: (!) 97.5 F (36.4 C) 97.9 F (36.6 C)  SpO2:  100%    Intake/Output Summary (Last 24 hours) at 12/23/2020 1158 Last data filed at 12/23/2020 0800 Gross per 24 hour  Intake 2525.21 ml  Output 600 ml  Net 1925.21 ml   Filed Weights   12/18/20 1709 12/18/20 2230  Weight: 77.1 kg 79.3 kg   Body mass index is 23.71 kg/m.   Physical Exam: General:  Average built, not in obvious distress, mild cognitive dysfunction oriented to self  HENT:   No scleral pallor or icterus noted. Oral mucosa is moist.  Chest:  .  Diminished breath sounds bilaterally.  CVS: S1 &S2 heard. No murmur.  Regular rate and rhythm. Abdomen: Soft, nontender, nondistended.  Bowel sounds are heard.   Extremities: No cyanosis, clubbing or edema.  Peripheral pulses are palpable. Psych: Alert, awake and  communicative, cognitive dysfunction at baseline, oriented to self. CNS:  No cranial nerve deficits.  Moves all extremities. Skin: Warm and dry.  No rashes noted.   Data Review: I have personally reviewed the following laboratory data and studies,  CBC: Recent Labs  Lab 12/18/20 1736 12/19/20 1133 12/20/20 0438 12/21/20 0421 12/23/20 0451  WBC 9.8 7.8 7.8 8.2 9.0  NEUTROABS 7.6 5.9 6.2  --   --   HGB 13.9 13.1 13.1 10.7* 10.8*  HCT 41.4 39.5 40.2 32.9* 32.5*  MCV 93.0 95.9 97.3 96.2 95.0  PLT 237 246 230 205 740   Basic Metabolic Panel: Recent Labs  Lab 12/19/20 1133 12/20/20 0438 12/21/20 0421 12/22/20 0910 12/23/20 0451  NA 145 148* 145 146* 145  K 3.4* 4.1 3.6 3.3* 3.9  CL 108 112* 113* 115* 116*  CO2 27 26 24 24  20*  GLUCOSE 131* 117* 104* 110* 91  BUN 13 16 14 16 12   CREATININE 1.46* 1.53* 1.51* 1.61* 1.50*  CALCIUM 8.5* 8.4* 8.0* 7.8* 8.0*  MG 1.8 2.0 1.7  --  1.7  PHOS 3.9 3.4 2.7  --  3.3   Liver Function Tests: Recent Labs  Lab 12/18/20 1736 12/19/20 1133 12/20/20 0438 12/21/20 0421 12/23/20 0451  AST 61* 135* 120* 59* 46*  ALT 138* 222* 231* 156* 119*  ALKPHOS 123 138* 139* 119 124  BILITOT 1.5* 1.8* 1.0 0.6 0.2*  PROT 6.4* 6.2* 6.2* 5.2* 5.4*  ALBUMIN 3.7 3.2* 3.2* 2.4* 2.4*   No results for input(s): LIPASE, AMYLASE in the last 168 hours. Recent Labs  Lab 12/20/20 0438  AMMONIA 10   Cardiac Enzymes: No results for input(s): CKTOTAL, CKMB, CKMBINDEX, TROPONINI in the last 168 hours. BNP (last 3 results) No results for input(s): BNP in the last 8760 hours.  ProBNP (last 3 results) No results for input(s): PROBNP in the last 8760 hours.  CBG: No results for input(s): GLUCAP in the last 168 hours. Recent Results (from the past 240 hour(s))  Blood Culture (routine x 2)     Status: Abnormal   Collection Time: 12/18/20  5:10 PM   Specimen: BLOOD RIGHT FOREARM  Result Value Ref Range Status   Specimen Description   Final    BLOOD RIGHT  FOREARM Performed at [facility], [address] [place], [place] [address]    Special Requests   Final    BOTTLES DRAWN AEROBIC ONLY Blood Culture results may not be optimal due to an inadequate volume of blood received in culture bottles Performed at [facility]    Culture  Setup Time   Final    AEROBIC BOTTLE ONLY GRAM POSITIVE COCCI CRITICAL RESULT CALLED TO, READ BACK BY AND VERIFIED WITH: E JACKSON PHARMD 12/20/20 0301 JDW    Culture (A)  Final    STAPHYLOCOCCUS EPIDERMIDIS THE SIGNIFICANCE OF ISOLATING THIS ORGANISM FROM A SINGLE SET OF BLOOD CULTURES WHEN MULTIPLE SETS ARE DRAWN IS UNCERTAIN. PLEASE NOTIFY THE MICROBIOLOGY DEPARTMENT WITHIN ONE WEEK IF SPECIATION AND SENSITIVITIES ARE REQUIRED. Performed at [facility], [place] [address], [place], [place] [address]    Report Status 12/21/2020 FINAL  Final  Blood Culture ID Panel (Reflexed)     Status: Abnormal   Collection Time: 12/18/20  5:10 PM  Result Value Ref Range Status   Enterococcus faecalis NOT DETECTED NOT DETECTED Final   Enterococcus Faecium NOT DETECTED NOT DETECTED Final   Listeria monocytogenes NOT DETECTED NOT DETECTED Final   Staphylococcus species DETECTED (A) NOT DETECTED Final    Comment: CRITICAL RESULT CALLED TO, READ BACK BY AND VERIFIED WITH: E JACKSON PHARMD 12/20/20 0301 JDW    Staphylococcus aureus (BCID) NOT DETECTED NOT DETECTED Final   Staphylococcus epidermidis DETECTED (A) NOT DETECTED Final    Comment: CRITICAL RESULT CALLED TO, READ BACK BY AND VERIFIED WITH: E JACKSON PHARMD 12/20/20 0301 JDW    Staphylococcus lugdunensis NOT DETECTED NOT DETECTED Final   Streptococcus species NOT DETECTED NOT DETECTED Final   Streptococcus agalactiae NOT DETECTED NOT DETECTED Final   Streptococcus pneumoniae NOT DETECTED NOT DETECTED Final   Streptococcus pyogenes NOT DETECTED NOT DETECTED Final   A.calcoaceticus-baumannii NOT DETECTED NOT DETECTED Final   Bacteroides fragilis  NOT DETECTED NOT DETECTED Final   Enterobacterales NOT DETECTED NOT DETECTED Final   Enterobacter cloacae complex NOT DETECTED NOT DETECTED Final   Escherichia coli NOT DETECTED NOT DETECTED Final  Klebsiella aerogenes NOT DETECTED NOT DETECTED Final   Klebsiella oxytoca NOT DETECTED NOT DETECTED Final   Klebsiella pneumoniae NOT DETECTED NOT DETECTED Final   Proteus species NOT DETECTED NOT DETECTED Final   Salmonella species NOT DETECTED NOT DETECTED Final   Serratia marcescens NOT DETECTED NOT DETECTED Final   Haemophilus influenzae NOT DETECTED NOT DETECTED Final   Neisseria meningitidis NOT DETECTED NOT DETECTED Final   Pseudomonas aeruginosa NOT DETECTED NOT DETECTED Final   Stenotrophomonas maltophilia NOT DETECTED NOT DETECTED Final   Candida albicans NOT DETECTED NOT DETECTED Final   Candida auris NOT DETECTED NOT DETECTED Final   Candida glabrata NOT DETECTED NOT DETECTED Final   Candida krusei NOT DETECTED NOT DETECTED Final   Candida parapsilosis NOT DETECTED NOT DETECTED Final   Candida tropicalis NOT DETECTED NOT DETECTED Final   Cryptococcus neoformans/gattii NOT DETECTED NOT DETECTED Final   Methicillin resistance mecA/C NOT DETECTED NOT DETECTED Final    Comment: Performed at [facility], [place] [address] [place], [place] [address]  Resp Panel by RT-PCR (Flu A&B, Covid) Nasopharyngeal Swab     Status: None   Collection Time: 12/18/20  5:36 PM   Specimen: Nasopharyngeal Swab; Nasopharyngeal(NP) swabs in vial transport medium  Result Value Ref Range Status   SARS Coronavirus 2 by RT PCR NEGATIVE NEGATIVE Final    Comment: (NOTE) SARS-CoV-2 target nucleic acids are NOT DETECTED.  The SARS-CoV-2 RNA is generally detectable in upper respiratory specimens during the acute phase of infection. The lowest concentration of SARS-CoV-2 viral copies this assay can detect is 138 copies/mL. A negative result does not preclude SARS-Cov-2 infection and should not be used as  the sole basis for treatment or other patient management decisions. A negative result may occur with  improper specimen collection/handling, submission of specimen other than nasopharyngeal swab, presence of viral mutation(s) within the areas targeted by this assay, and inadequate number of viral copies(<138 copies/mL). A negative result must be combined with clinical observations, patient history, and epidemiological information. The expected result is Negative.  Fact Sheet for Patients:  EntrepreneurPulse.com.au  Fact Sheet for Healthcare Providers:  IncredibleEmployment.be  This test is no t yet approved or cleared by the [place] FDA and  has been authorized for detection and/or diagnosis of SARS-CoV-2 by FDA under an Emergency Use Authorization (EUA). This EUA will remain  in effect (meaning this test can be used) for the duration of the COVID-19 declaration under Section 564(b)(1) of the Act, 21 U.S.C.section 360bbb-3(b)(1), unless the authorization is terminated  or revoked sooner.       Influenza A by PCR NEGATIVE NEGATIVE Final   Influenza B by PCR NEGATIVE NEGATIVE Final    Comment: (NOTE) The Xpert Xpress SARS-CoV-2/FLU/RSV plus assay is intended as an aid in the diagnosis of influenza from Nasopharyngeal swab specimens and should not be used as a sole basis for treatment. Nasal washings and aspirates are unacceptable for Xpert Xpress SARS-CoV-2/FLU/RSV testing.  Fact Sheet for Patients: EntrepreneurPulse.com.au  Fact Sheet for Healthcare Providers: IncredibleEmployment.be  This test is not yet approved or cleared by the [place] FDA and has been authorized for detection and/or diagnosis of SARS-CoV-2 by FDA under an Emergency Use Authorization (EUA). This EUA will remain in effect (meaning this test can be used) for the duration of the COVID-19 declaration under Section 564(b)(1) of  the Act, 21 U.S.C. section 360bbb-3(b)(1), unless the authorization is terminated or revoked.  Performed at [facility]   Blood  Culture (routine x 2)     Status: None (Preliminary result)   Collection Time: 12/18/20  5:38 PM   Specimen: BLOOD  Result Value Ref Range Status   Specimen Description BLOOD LEFT ANTECUBITAL  Final   Special Requests   Final    Blood Culture adequate volume BOTTLES DRAWN AEROBIC AND ANAEROBIC   Culture   Final    NO GROWTH 4 DAYS Performed at [facility], [place] [address] [place], [place] [address]    Report Status PENDING  Incomplete  Culture, Urine     Status: None   Collection Time: 12/19/20 10:30 AM   Specimen: Urine, Random  Result Value Ref Range Status   Specimen Description   Final    URINE, RANDOM Performed at [place] [address], [place], [place] [address]    Special Requests   Final    NONE Performed at [facility], [place] [address], [place], [place] [address]    Culture   Final    NO GROWTH Performed at [facility], [place] [address], [place], [place] [address]    Report Status 12/20/2020 FINAL  Final     Studies: No results found.   Flora Lipps, MD  [facility] 12/23/2020  If 7PM-7AM, please contact night-coverage

## 2020-12-23 NOTE — Progress Notes (Signed)
Occupational Therapy Treatment Patient Details Name: Sean [name] MRN: [id number] DOB: 28-Oct-1965 Today's Date: 12/23/2020    History of present illness Sean [name] is a 55 y.o. male.  Presents to ER with concern for fever.following cancer treatment on 12/18/20.Patient has a history of metastatic melanoma.  Extensive brain mets. Patient has been at [facility] for the past 17 days. per wife, plans to return per CM note.   OT comments  Patient found on his way to the bathroom with bed alarm going off when therapist entered the room. Patient min guard for ambulation but had no overt loss of balance. Patient grossly able to perform toileting without assistance or verbal cues though he did urinate on the floor - expect from difficulty managing hospital gown. Patient needed multimodal cues to stand at sink to wash his hands, walk to window bench and don socks and to self feed. RN reporting patient not eating much. Patient fed himself a pudding cup after placing cup and spoon in his hand. Therapist recommended more finger foods to see if that would improve self feeding task. Patient reports his favorite food is tuna. Hope to improve feeding participation. Patient overall is improving his ability to perform daily tasks. However, suspect his cognition is at baseline and will always need supervision and cues to perform and initiate daily tasks. Will continue to work with patient acutely for now.   Follow Up Recommendations  SNF    Equipment Recommendations  None recommended by OT    Recommendations for Other Services      Precautions / Restrictions Precautions Precautions: Fall Precaution Comments: cognitive deficits - doesn't follow verbal commands well Restrictions Weight Bearing Restrictions: No       Mobility Bed Mobility Overal bed mobility: Independent             General bed mobility comments: Patient found out of bed in headed into bathroom.    Transfers Overall transfer  level: Needs assistance Equipment used: None             General transfer comment: Min guard for in room abulation    Balance Overall balance assessment: Mild deficits observed, not formally tested                                         ADL either performed or assessed with clinical judgement   ADL Overall ADL's : Needs assistance/impaired Eating/Feeding: Set up;Sitting;Cueing for sequencing;Supervision/ safety Eating/Feeding Details (indicate cue type and reason): Supervision, setup, some tactile cueing to feed self. Patient able to feed himself most of pudding cup after cup and spoon placed in his hands. Grooming: Supervision/safety;Cueing for sequencing;Set up Grooming Details (indicate cue type and reason): tactile,verbal and visual cues to get patient to wash his hands at the sink.             Lower Body Dressing: Set up;Sitting/lateral leans Lower Body Dressing Details (indicate cue type and reason): to don socks, multiple verbal cues and placing socks in hands twice (after the first time he set them to the side) Toilet Transfer: Loss adjuster, chartered Details (indicate cue type and reason): Patient ambulating to bathroom with bed alarm going off without a device. Patient min guard to ambulate without overt loss of balance and stand over toilet Toileting- Clothing Manipulation and Hygiene: Minimal assistance;Sit to/from stand Toileting - Clothing Manipulation Details (indicate cue type and reason):  patient urinating in toilet - he had difficulty managing hospital gown and urinated in the floor some as well but overall generally well             Vision Patient Visual Report: No change from baseline     Perception     Praxis      Cognition Arousal/Alertness: Awake/alert Behavior During Therapy: WFL for tasks assessed/performed Overall Cognitive Status: History of cognitive impairments - at baseline                                  General Comments: Requires multimodal cues to perform any task and follow instructions. Appears to be his baseline cognition.        Exercises     Shoulder Instructions       General Comments      Pertinent Vitals/ Pain       Pain Assessment: No/denies pain  Home Living                                          Prior Functioning/Environment              Frequency  Min 2X/week        Progress Toward Goals  OT Goals(current goals can now be found in the care plan section)  Progress towards OT goals: Progressing toward goals  Acute Rehab OT Goals Patient Stated Goal: he needs to go to LTC, OT Goal Formulation: Patient unable to participate in goal setting Time For Goal Achievement: 01/03/21 Potential to Achieve Goals: [place] Discharge plan remains appropriate    Co-evaluation          OT goals addressed during session: ADL's and self-care      AM-PAC OT "6 Clicks" Daily Activity     Outcome Measure   Help from another person eating meals?: A Little Help from another person taking care of personal grooming?: A Little Help from another person toileting, which includes using toliet, bedpan, or urinal?: A Little Help from another person bathing (including washing, rinsing, drying)?: A Little Help from another person to put on and taking off regular upper body clothing?: A Little Help from another person to put on and taking off regular lower body clothing?: A Little 6 Click Score: 18    End of Session    OT Visit Diagnosis: Other abnormalities of gait and mobility (R26.89);Muscle weakness (generalized) (M62.81);Other symptoms and signs involving cognitive function;Other symptoms and signs involving the nervous system (R29.898)   Activity Tolerance Patient tolerated treatment well   Patient Left with nursing/sitter in room   Nurse Communication Mobility status (tips on how to assist with feeding)         Time: 1410-1427 OT Time Calculation (min): 17 min  Charges: OT General Charges $OT Visit: 1 Visit OT Treatments $Self Care/Home Management : 8-22 mins  Derl Barrow, OTR/L [place]  Office [phone] Pager: [place] 12/23/2020, 4:37 PM

## 2020-12-24 DIAGNOSIS — C7931 Secondary malignant neoplasm of brain: Secondary | ICD-10-CM | POA: Diagnosis not present

## 2020-12-24 DIAGNOSIS — J189 Pneumonia, unspecified organism: Secondary | ICD-10-CM | POA: Diagnosis not present

## 2020-12-24 DIAGNOSIS — A419 Sepsis, unspecified organism: Secondary | ICD-10-CM | POA: Diagnosis not present

## 2020-12-24 DIAGNOSIS — R7989 Other specified abnormal findings of blood chemistry: Secondary | ICD-10-CM | POA: Diagnosis not present

## 2020-12-24 LAB — MRSA PCR SCREENING: MRSA by PCR: NEGATIVE

## 2020-12-24 MED ORDER — LORAZEPAM 0.5 MG PO TABS
0.5000 mg | ORAL_TABLET | Freq: Four times a day (QID) | ORAL | Status: DC | PRN
  Administered 2020-12-24 – 2020-12-25 (×2): 0.5 mg via ORAL
  Filled 2020-12-24 (×2): qty 1

## 2020-12-24 MED ORDER — LORAZEPAM 2 MG/ML IJ SOLN: 0.5000 mg | Freq: Four times a day (QID) | INTRAMUSCULAR | Status: DC | PRN

## 2020-12-24 NOTE — Progress Notes (Signed)
PROGRESS NOTE  Javarus Dorner WUJ:[id number] DOB: [birth year] DOA: 12/18/2020 PCP: [name], MD   LOS: 5 days   Brief narrative: Nylan Nevel is a 55 y.o. 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  Patient does have baseline cognitive dysfunction due to metastatic brain disease and is only oriented to self and place.  History was obtained from the patient's family who reported a fever of 101.4 F with increased breathing.  In the ED patient was noted to be slightly tachycardic and had mild hypotension.  Laboratory data showed normal leukocyte count but lactate was elevated at 2.4 which trended up to 2.7.  COVID-19 and influenza test was negative.  Chest x-ray was reported as left lower lobe airspace opacity.  In the ED, CT head was performed which showed stable but extensive hemorrhagic metastasis.  Blood cultures were drawn from the ED.  Patient received 2 L of lactated ringer, Rocephin and Zithromax and was admitted to hospital for further evaluation and treatment.  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  Oncology has seen the patient.  Patient is on immunotherapy as outpatient.  One of the blood cultures with staph epidermidis- likely contaminant.  Completed a 5-day course of Rocephin and Zithromax.    Metastatic melanoma-with extensive mets including brain.   S/p craniotomy and resection of left  occipital and left frontal lesion in 09/2020.  CT head shows stable extensive hemorrhagic mets.    Followed by Dr [name], oncology.  Continue dexamethasone and Keppra from home.    Acute kidney injury  Continue to monitor.  On IV fluids.  Baseline creatinine of around 0.6-0.7.  Continue with IV fluids for now.  Avoid nephrotoxic medications.  Latest creatinine of 1.5 today.  This has plateaued at this time.  Will need outpatient follow-up.  Patient with good urinary output.  Patient is positive balance for 4158 mL  Lab Results  Component Value Date   CREATININE 1.50 (H) 12/23/2020   CREATININE 1.61 (H) 12/22/2020   CREATININE 1.51 (H) 12/21/2020   History of seizure due to brain mets Continue Keppra.    Elevated LFTs.  Likely secondary to metastatic disease. Hepatitis panel was nonreactive.  Hypokalemia: We will continue to replenish closely.  Potassium 3.3 today.  Add p.o. potassium twice daily x3.  Mild hypernatremia.  Improved.  Sodium of 145 today.  On half-normal saline, will discontinue.  Cognitive impairment likely from brain mets.   Alert, awake and oriented to person and self.  Likely at baseline at this time.  Generalized weakness/debility Continue physical therapy, occupational therapy evaluation.  Patient is currently at the skilled nursing facility.  DVT prophylaxis: SCDs Start: 12/19/20 9562  Code Status: Partial code   Family Communication:  None today.    Status is: Inpatient  Remains inpatient appropriate because:IV treatments appropriate due to intensity of illness or inability to take PO and Inpatient level of care appropriate due to severity of illness  Dispo: The patient is from:  SNF              Anticipated d/c is to: SNF, pending placement.              Patient currently is  medically stable to d/c.   Difficult to place patient No  Consultants:  Medical oncology  Procedures:  None  Anti-infectives:  Marland Kitchen Rocephin and Zithromax  4/4>  Anti-infectives (From admission, onward)   Start     Dose/Rate Route Frequency Ordered Stop   12/19/20 1800  cefTRIAXone (ROCEPHIN) 1 g in sodium chloride 0.9 % 100 mL IVPB        1 g 200 mL/hr over 30 Minutes Intravenous Every 24 hours 12/19/20 0715 12/22/20 2216   12/19/20 1500  azithromycin (ZITHROMAX) 500 mg in sodium chloride 0.9 % 250 mL IVPB        500 mg 250 mL/hr over 60 Minutes Intravenous Every 24 hours 12/19/20 1355 12/23/20 1755   12/18/20 2015  cefTRIAXone (ROCEPHIN) 1 g in sodium chloride 0.9 % 100 mL IVPB        1 g 200 mL/hr over 30 Minutes Intravenous  Once 12/18/20 2011 12/18/20 2125   12/18/20 2015  azithromycin (ZITHROMAX) 500 mg in sodium chloride 0.9 % 250 mL IVPB  Status:  Discontinued        500 mg 250 mL/hr over 60 Minutes Intravenous  Once 12/18/20 2011 12/19/20 1355     Subjective: Today, patient was seen and examined at bedside.  Denies any nausea vomiting fever chills.  Objective: Vitals:   12/23/20 2104 12/24/20 0631  BP: 108/70 (!) 89/62  Pulse: 77 64  Resp: 20 20  Temp: 98.3 F (36.8 C) 97.7 F (36.5 C)  SpO2: 96% 97%    Intake/Output Summary (Last 24 hours) at 12/24/2020 1150 Last data filed at 12/24/2020 1100 Gross per 24 hour  Intake 2010.56 ml  Output 2800 ml  Net -789.44 ml   Filed Weights   12/18/20 1709 12/18/20 2230  Weight: 77.1 kg 79.3 kg   Body mass index is 23.71 kg/m.   Physical Exam: General:  Average built, not in obvious distress, mild cognitive dysfunction.  Oriented to self HENT:   No scleral pallor or icterus noted. Oral mucosa is moist.  Chest:  Clear breath sounds.  Diminished breath sounds bilaterally. No crackles or wheezes.  CVS: S1 &S2 heard. No murmur.  Regular rate and rhythm. Abdomen: Soft, nontender, nondistended.  Bowel sounds are heard.  Foley catheter in place. Extremities: No cyanosis, clubbing or edema.  Peripheral pulses are palpable. Psych: Alert, awake and mild cognitive dysfunction at  baseline, oriented to self. CNS:  No cranial nerve deficits.  Power equal in all extremities.   Skin: Warm and dry.  No rashes noted.   Data Review: I have personally reviewed the following laboratory data and studies,  CBC: Recent Labs  Lab 12/18/20 1736 12/19/20 1133 12/20/20 0438 12/21/20 0421 12/23/20 0451  WBC 9.8 7.8 7.8 8.2 9.0  NEUTROABS 7.6 5.9 6.2  --   --   HGB 13.9 13.1 13.1 10.7* 10.8*  HCT 41.4 39.5 40.2 32.9* 32.5*  MCV 93.0 95.9 97.3 96.2 95.0  PLT 237 246 230 205 809   Basic Metabolic Panel: Recent Labs  Lab 12/19/20 1133 12/20/20 0438 12/21/20 0421 12/22/20 0910 12/23/20 0451  NA 145 148* 145 146* 145  K 3.4* 4.1 3.6 3.3* 3.9  CL 108 112* 113* 115* 116*  CO2 27 26 24 24  20*  GLUCOSE 131*  117* 104* 110* 91  BUN 13 16 14 16 12   CREATININE 1.46* 1.53* 1.51* 1.61* 1.50*  CALCIUM 8.5* 8.4* 8.0* 7.8* 8.0*  MG 1.8 2.0 1.7  --  1.7  PHOS 3.9 3.4 2.7  --  3.3   Liver Function Tests: Recent Labs  Lab 12/18/20 1736 12/19/20 1133 12/20/20 0438 12/21/20 0421 12/23/20 0451  AST 61* 135* 120* 59* 46*  ALT 138* 222* 231* 156* 119*  ALKPHOS 123 138* 139* 119 124  BILITOT 1.5* 1.8* 1.0 0.6 0.2*  PROT 6.4* 6.2* 6.2* 5.2* 5.4*  ALBUMIN 3.7 3.2* 3.2* 2.4* 2.4*   No results for input(s): LIPASE, AMYLASE in the last 168 hours. Recent Labs  Lab 12/20/20 0438  AMMONIA 10   Cardiac Enzymes: No results for input(s): CKTOTAL, CKMB, CKMBINDEX, TROPONINI in the last 168 hours. BNP (last 3 results) No results for input(s): BNP in the last 8760 hours.  ProBNP (last 3 results) No results for input(s): PROBNP in the last 8760 hours.  CBG: No results for input(s): GLUCAP in the last 168 hours. Recent Results (from the past 240 hour(s))  Blood Culture (routine x 2)     Status: Abnormal   Collection Time: 12/18/20  5:10 PM   Specimen: BLOOD RIGHT FOREARM  Result Value Ref Range Status   Specimen Description   Final    BLOOD RIGHT FOREARM Performed at [facility], [address], [place], [place] [address]    Special Requests   Final    BOTTLES DRAWN AEROBIC ONLY Blood Culture results may not be optimal due to an inadequate volume of blood received in culture bottles Performed at [facility]    Culture  Setup Time   Final    AEROBIC BOTTLE ONLY GRAM POSITIVE COCCI CRITICAL RESULT CALLED TO, READ BACK BY AND VERIFIED WITH: E JACKSON PHARMD 12/20/20 0301 JDW    Culture (A)  Final    STAPHYLOCOCCUS EPIDERMIDIS THE SIGNIFICANCE OF ISOLATING THIS ORGANISM FROM A SINGLE SET OF BLOOD CULTURES WHEN MULTIPLE SETS ARE DRAWN IS UNCERTAIN. PLEASE NOTIFY THE MICROBIOLOGY DEPARTMENT WITHIN ONE WEEK IF SPECIATION AND SENSITIVITIES ARE REQUIRED. Performed at [facility], [place] [address], [place],  [address]    Report Status 12/21/2020 FINAL  Final  Blood Culture ID Panel (Reflexed)     Status: Abnormal   Collection Time: 12/18/20  5:10 PM  Result Value Ref Range Status   Enterococcus faecalis NOT DETECTED NOT DETECTED Final   Enterococcus Faecium NOT DETECTED NOT DETECTED Final   Listeria monocytogenes NOT DETECTED NOT DETECTED Final   Staphylococcus species DETECTED (A) NOT DETECTED Final    Comment: CRITICAL RESULT CALLED TO, READ BACK BY AND VERIFIED WITH: E JACKSON PHARMD 12/20/20 0301 JDW    Staphylococcus aureus (BCID) NOT DETECTED NOT DETECTED Final   Staphylococcus epidermidis DETECTED (A) NOT DETECTED Final    Comment: CRITICAL RESULT CALLED TO, READ BACK BY AND VERIFIED WITH: E JACKSON PHARMD 12/20/20 0301 JDW    Staphylococcus lugdunensis NOT DETECTED NOT DETECTED Final   Streptococcus species NOT DETECTED NOT DETECTED Final   Streptococcus agalactiae NOT DETECTED NOT DETECTED Final   Streptococcus pneumoniae NOT DETECTED NOT DETECTED Final   Streptococcus pyogenes NOT DETECTED NOT DETECTED Final   A.calcoaceticus-baumannii NOT DETECTED NOT DETECTED Final   Bacteroides fragilis NOT DETECTED NOT DETECTED  Final   Enterobacterales NOT DETECTED NOT DETECTED Final   Enterobacter cloacae complex NOT DETECTED NOT DETECTED Final   Escherichia coli  NOT DETECTED NOT DETECTED Final   Klebsiella aerogenes NOT DETECTED NOT DETECTED Final   Klebsiella oxytoca NOT DETECTED NOT DETECTED Final   Klebsiella pneumoniae NOT DETECTED NOT DETECTED Final   Proteus species NOT DETECTED NOT DETECTED Final   Salmonella species NOT DETECTED NOT DETECTED Final   Serratia marcescens NOT DETECTED NOT DETECTED Final   Haemophilus influenzae NOT DETECTED NOT DETECTED Final   Neisseria meningitidis NOT DETECTED NOT DETECTED Final   Pseudomonas aeruginosa NOT DETECTED NOT DETECTED Final   Stenotrophomonas maltophilia NOT DETECTED NOT DETECTED Final   Candida albicans NOT DETECTED NOT DETECTED Final   Candida auris NOT DETECTED NOT DETECTED Final   Candida glabrata NOT DETECTED NOT DETECTED Final   Candida krusei NOT DETECTED NOT DETECTED Final   Candida parapsilosis NOT DETECTED NOT DETECTED Final   Candida tropicalis NOT DETECTED NOT DETECTED Final   Cryptococcus neoformans/gattii NOT DETECTED NOT DETECTED Final   Methicillin resistance mecA/C NOT DETECTED NOT DETECTED Final    Comment: Performed at [facility], [place] [address], [place], [place] [address]  Resp Panel by RT-PCR (Flu A&B, Covid) Nasopharyngeal Swab     Status: None   Collection Time: 12/18/20  5:36 PM   Specimen: Nasopharyngeal Swab; Nasopharyngeal(NP) swabs in vial transport medium  Result Value Ref Range Status   SARS Coronavirus 2 by RT PCR NEGATIVE NEGATIVE Final    Comment: (NOTE) SARS-CoV-2 target nucleic acids are NOT DETECTED.  The SARS-CoV-2 RNA is generally detectable in upper respiratory specimens during the acute phase of infection. The lowest concentration of SARS-CoV-2 viral copies this assay can detect is 138 copies/mL. A negative result does not preclude SARS-Cov-2 infection and should not be used as the sole basis for  treatment or other patient management decisions. A negative result may occur with  improper specimen collection/handling, submission of specimen other than nasopharyngeal swab, presence of viral mutation(s) within the areas targeted by this assay, and inadequate number of viral copies(<138 copies/mL). A negative result must be combined with clinical observations, patient history, and epidemiological information. The expected result is Negative.  Fact Sheet for Patients:  EntrepreneurPulse.com.au  Fact Sheet for Healthcare Providers:  IncredibleEmployment.be  This test is no t yet approved or cleared by the [place] FDA and  has been authorized for detection and/or diagnosis of SARS-CoV-2 by FDA under an Emergency Use Authorization (EUA). This EUA will remain  in effect (meaning this test can be used) for the duration of the COVID-19 declaration under Section 564(b)(1) of the Act, 21 U.S.C.section 360bbb-3(b)(1), unless the authorization is terminated  or revoked sooner.       Influenza A by PCR NEGATIVE NEGATIVE Final   Influenza B by PCR NEGATIVE NEGATIVE Final    Comment: (NOTE) The Xpert Xpress SARS-CoV-2/FLU/RSV plus assay is intended as an aid in the diagnosis of influenza from Nasopharyngeal swab specimens and should not be used as a sole basis for treatment. Nasal washings and aspirates are unacceptable for Xpert Xpress SARS-CoV-2/FLU/RSV testing.  Fact Sheet for Patients: EntrepreneurPulse.com.au  Fact Sheet for Healthcare Providers: IncredibleEmployment.be  This test is not yet approved or cleared by the [place] FDA and has been authorized for detection and/or diagnosis of SARS-CoV-2 by FDA under an Emergency Use Authorization (EUA). This EUA will remain in effect (meaning this test can be used) for the duration of the COVID-19 declaration under Section 564(b)(1) of the Act, 21  U.S.C. section 360bbb-3(b)(1), unless the authorization is terminated or revoked.  Performed at  [facility]   Blood Culture (routine x 2)     Status: None   Collection Time: 12/18/20  5:38 PM   Specimen: BLOOD  Result Value Ref Range Status   Specimen Description BLOOD LEFT ANTECUBITAL  Final   Special Requests   Final    Blood Culture adequate volume BOTTLES DRAWN AEROBIC AND ANAEROBIC   Culture   Final    NO GROWTH 5 DAYS Performed at [facility], [place] [address] [place], [place] [address]    Report Status 12/23/2020 FINAL  Final  Culture, Urine     Status: None   Collection Time: 12/19/20 10:30 AM   Specimen: Urine, Random  Result Value Ref Range Status   Specimen Description   Final    URINE, RANDOM Performed at [place] [address] [place], [place] [address]    Special Requests   Final    NONE Performed at [facility], [place] [address], [place], [place] [address]    Culture   Final    NO GROWTH Performed at [facility], [place] [address], [place],  [address]    Report Status 12/20/2020 FINAL  Final     Studies: No results found.   Flora Lipps, MD  [facility] 12/24/2020  If 7PM-7AM, please contact night-coverage

## 2020-12-24 NOTE — Progress Notes (Signed)
Patient has gotten up out of the bed several times so far this shift.  Patient noted to be very unsteady and requiring assist of at least 1.  He has attempted to take his IV apart several times and has also removed his condom catheter.  Redirection does not seem to work at this time.  MD aware.  Will continue to monitor.

## 2020-12-24 NOTE — TOC Progression Note (Addendum)
Transition of Care [facility]) - Progression Note    Patient Details  Name: Sean [name] MRN: [id number] Date of Birth: [birth year]  Transition of Care [facility] CM/SW [place], [place] Phone Number:  [phone] 12/24/2020, 1:44 PM  Clinical Narrative:     CSW left voicemail for Teena at [facility] [phone] for update on patient's SNF insurance authorization.   Expected Discharge Plan: [place] Barriers to Discharge: Continued Medical Work up  Expected Discharge Plan and Services Expected Discharge Plan: [place]   Discharge Planning Services: CM Consult Post Acute Care Choice: Resumption of Svcs/PTA Provider Living arrangements for the past 2 months: Single Family Home,Skilled Nursing Facility                                       Social Determinants of Health (SDOH) Interventions    Readmission Risk Interventions No flowsheet data found.

## 2020-12-25 ENCOUNTER — Inpatient Hospital Stay: Payer: 59 | Admitting: Internal Medicine

## 2020-12-25 DIAGNOSIS — C7931 Secondary malignant neoplasm of brain: Secondary | ICD-10-CM | POA: Diagnosis not present

## 2020-12-25 DIAGNOSIS — R6520 Severe sepsis without septic shock: Secondary | ICD-10-CM | POA: Diagnosis not present

## 2020-12-25 DIAGNOSIS — R7989 Other specified abnormal findings of blood chemistry: Secondary | ICD-10-CM | POA: Diagnosis not present

## 2020-12-25 DIAGNOSIS — A419 Sepsis, unspecified organism: Secondary | ICD-10-CM | POA: Diagnosis not present

## 2020-12-25 DIAGNOSIS — J189 Pneumonia, unspecified organism: Secondary | ICD-10-CM | POA: Diagnosis not present

## 2020-12-25 LAB — URINALYSIS, COMPLETE (UACMP) WITH MICROSCOPIC
Bilirubin Urine: NEGATIVE
Glucose, UA: NEGATIVE mg/dL
Ketones, ur: NEGATIVE mg/dL
Nitrite: NEGATIVE
Protein, ur: NEGATIVE mg/dL
Specific Gravity, Urine: 1.003 — ABNORMAL LOW (ref 1.005–1.030)
pH: 6 (ref 5.0–8.0)

## 2020-12-25 LAB — BASIC METABOLIC PANEL
Anion gap: 8 (ref 5–15)
BUN: 13 mg/dL (ref 6–20)
CO2: 23 mmol/L (ref 22–32)
Calcium: 8.3 mg/dL — ABNORMAL LOW (ref 8.9–10.3)
Chloride: 107 mmol/L (ref 98–111)
Creatinine, Ser: 2.18 mg/dL — ABNORMAL HIGH (ref 0.61–1.24)
GFR, Estimated: 35 mL/min — ABNORMAL LOW (ref >60)
Glucose, Bld: 100 mg/dL — ABNORMAL HIGH (ref 70–99)
Potassium: 3.4 mmol/L — ABNORMAL LOW (ref 3.5–5.1)
Sodium: 138 mmol/L (ref 135–145)

## 2020-12-25 LAB — CREATININE, URINE, RANDOM: Creatinine, Urine: 27.7 mg/dL

## 2020-12-25 LAB — SODIUM, URINE, RANDOM: Sodium, Ur: 56 mmol/L

## 2020-12-25 MED ORDER — LORAZEPAM 2 MG/ML IJ SOLN
1.0000 mg | Freq: Once | INTRAMUSCULAR | Status: AC
  Administered 2020-12-25: 1 mg via INTRAVENOUS
  Filled 2020-12-25: qty 1

## 2020-12-25 MED ORDER — PREDNISONE 20 MG PO TABS
40.0000 mg | ORAL_TABLET | Freq: Every day | ORAL | Status: DC
  Administered 2020-12-26 – 2021-01-03 (×9): 40 mg via ORAL
  Filled 2020-12-25 (×9): qty 2

## 2020-12-25 MED ORDER — PREDNISONE 20 MG PO TABS: 60.0000 mg | ORAL_TABLET | Freq: Every day | ORAL | Status: DC

## 2020-12-25 MED ORDER — POTASSIUM CHLORIDE CRYS ER 20 MEQ PO TBCR
40.0000 meq | EXTENDED_RELEASE_TABLET | Freq: Once | ORAL | Status: AC
  Administered 2020-12-25: 40 meq via ORAL
  Filled 2020-12-25: qty 2

## 2020-12-25 NOTE — Progress Notes (Signed)
PROGRESS NOTE  Sean [name] UQJ:[id number] DOB: 08/22/1966 DOA: 12/18/2020 PCP: Sean Pier, MD   LOS: 6 days   Brief narrative: Sean [name] is a 55 y.o. 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  Patient does have baseline cognitive dysfunction due to metastatic brain disease and is only oriented to self and place.  History was obtained from the patient's family who reported a fever of 101.4 F with increased breathing.  In the ED patient was noted to be slightly tachycardic and had mild hypotension.  Laboratory data showed normal leukocyte count but lactate was elevated at 2.4 which trended up to 2.7.  COVID-19 and influenza test was negative.  Chest x-ray was reported as left lower lobe airspace opacity.  In the ED, CT head was performed which showed stable but extensive hemorrhagic metastasis.  Blood cultures were drawn from the ED.  Patient received 2 L of lactated ringer, Rocephin and Zithromax and was admitted to hospital for further evaluation and treatment.  During hospitalization, patient was noted to have elevated creatinine which started trending up despite IV fluids.  He was more agitated and required one-to-one sitter and Ativan for agitation.  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  Completed a 5-day course of Rocephin and Zithromax.    Agitation, confusion.  Required one-to-one sitter.  Could not use Haldol due to history of seizures.  For safety reasons we will consider Ativan as needed for extreme  agitation. Will try to discontinue ritalin for now.  Could start at a lower dose tomorrow.  Spoke with the patient's spouse about it and believes that ritalin could do that to him.  Metastatic melanoma-with extensive mets including brain.   S/p craniotomy and resection of left occipital and left frontal lesion in 09/2020.  CT head shows stable extensive hemorrhagic mets.    Followed by Sean [name], oncology.  Continue dexamethasone and Keppra from home.  Seen by  Sean. Learta [name] during hospitalization.  Acute kidney injury  Continue to monitor.  On IV fluids.  Baseline creatinine of around 0.6-0.7.  Initially received IV fluids but his creatinine has started trending up.  Bladder scan did not show any residual urine volume.  Will get nephrology evaluation due to possibility of nivolumab induced toxicity.  Recommended nephrology evaluation as per oncology today.  We will also get renal ultrasound, urinary sodium and creatinine levels and urine analysis. Might need IVFs. Will follow nephrology input.  Lab Results  Component Value Date   CREATININE 2.18 (H) 12/25/2020   CREATININE 1.50 (H) 12/23/2020   CREATININE 1.61 (H) 12/22/2020   History of seizure due to brain mets Continue Keppra.    Elevated LFTs.  Likely secondary to metastatic disease. Hepatitis panel was nonreactive.  Will get CMP in a.m.  Hypokalemia: Replenished orally.  Potassium 3.4 today.  We will continue to replenish.  Mild hypernatremia.  Improved.  Sodium of 138 today.  Cognitive impairment likely from brain mets.   Alert, awake and oriented to person only, being more agitated and confused.  Requiring one-to-one sitter and Ativan for  agitation.  Generalized weakness/debility Continue physical therapy, occupational therapy evaluation.  Patient is currently at the skilled nursing facility.  DVT prophylaxis: SCDs Start: 12/19/20 4010  Code Status: Partial code   Family Communication:  I spoke with the patient's spouse Ms.  Sean [name] and updated her about the clinical condition of the patient.   Status is: Inpatient  Remains inpatient appropriate because:IV treatments appropriate due to intensity of illness or inability to take PO and Inpatient level of care appropriate due to severity of illness  Dispo: The patient is from: SNF              Anticipated d/c is to: SNF, likely in 1-2 days.              Patient currently is  not medically stable to d/c.   Difficult to place patient No  Consultants:  Medical oncology  nephrology  Procedures:  None  Anti-infectives:  Marland Kitchen Rocephin and Zithromax 4/4>4/9  Anti-infectives (From admission, onward)   Start     Dose/Rate Route Frequency Ordered Stop   12/19/20 1800  cefTRIAXone (ROCEPHIN) 1 g in sodium chloride 0.9 % 100 mL IVPB        1 g 200 mL/hr over 30 Minutes Intravenous Every 24 hours 12/19/20 0715 12/22/20 2216   12/19/20 1500  azithromycin (ZITHROMAX) 500 mg in sodium chloride 0.9 % 250 mL IVPB        500 mg 250 mL/hr over 60 Minutes Intravenous Every 24 hours 12/19/20 1355 12/23/20 1755   12/18/20 2015  cefTRIAXone (ROCEPHIN) 1 g in sodium chloride 0.9 % 100 mL IVPB        1 g 200 mL/hr over 30 Minutes Intravenous  Once 12/18/20 2011 12/18/20 2125   12/18/20 2015  azithromycin (ZITHROMAX) 500 mg in sodium chloride 0.9 % 250 mL IVPB  Status:  Discontinued        500 mg 250 mL/hr over 60 Minutes Intravenous  Once 12/18/20 2011 12/19/20 1355     Subjective: Today, patient was seen and examined at bedside.  Patient denies symptoms with the nursing staff reported that he has been agitated confused and trying to get out of the bed with high fall risk.  One-to-one sitter at bedside  Objective: Vitals:   12/24/20 2248 12/25/20 0606  BP: 111/73 113/73  Pulse: 78 99  Resp: 16 18  Temp: 98 F (36.7 C) 98.8 F (37.1 C)  SpO2: 96% 97%    Intake/Output Summary (Last 24 hours) at 12/25/2020 1345 Last data filed at 12/25/2020 2725 Gross per 24 hour   Intake 660 ml  Output 400 ml  Net 260 ml   Filed Weights   12/18/20 1709 12/18/20 2230  Weight: 77.1 kg 79.3 kg   Body mass index is 23.71 kg/m.   Physical Exam:  General:  Average built, not in obvious distress, cognitive dysfunction at baseline, orientation to self, appears to be mildly restless HENT:   No scleral pallor or icterus noted. Oral mucosa is moist.  Chest:  Clear breath sounds.  Diminished breath sounds bilaterally. No crackles or wheezes.  CVS: S1 &S2 heard. No murmur.  Regular rate and rhythm. Abdomen: Soft, nontender, nondistended.  Bowel sounds are heard.   Extremities: No cyanosis, clubbing or edema.  Peripheral pulses are palpable. Psych: Alert, awake and with cognitive dysfunction, oriented to self, mildly restless CNS:  No cranial nerve deficits.  Power equal in all extremities.  Moving all extremities. Skin: Warm and dry.  No rashes  noted.  Data Review: I have personally reviewed the following laboratory data and studies,  CBC: Recent Labs  Lab 12/18/20 1736 12/19/20 1133 12/20/20 0438 12/21/20 0421 12/23/20 0451  WBC 9.8 7.8 7.8 8.2 9.0  NEUTROABS 7.6 5.9 6.2  --   --   HGB 13.9 13.1 13.1 10.7* 10.8*  HCT 41.4 39.5 40.2 32.9* 32.5*  MCV 93.0 95.9 97.3 96.2 95.0  PLT 237 246 230 205 564   Basic Metabolic Panel: Recent Labs  Lab 12/19/20 1133 12/20/20 0438 12/21/20 0421 12/22/20 0910 12/23/20 0451 12/25/20 0502  NA 145 148* 145 146* 145 138  K 3.4* 4.1 3.6 3.3* 3.9 3.4*  CL 108 112* 113* 115* 116* 107  CO2 27 26 24 24  20* 23  GLUCOSE 131* 117* 104* 110* 91 100*  BUN 13 16 14 16 12 13   CREATININE 1.46* 1.53* 1.51* 1.61* 1.50* 2.18*  CALCIUM 8.5* 8.4* 8.0* 7.8* 8.0* 8.3*  MG 1.8 2.0 1.7  --  1.7  --   PHOS 3.9 3.4 2.7  --  3.3  --    Liver Function Tests: Recent Labs  Lab 12/18/20 1736 12/19/20 1133 12/20/20 0438 12/21/20 0421 12/23/20 0451  AST 61* 135* 120* 59* 46*  ALT 138* 222* 231* 156* 119*  ALKPHOS 123 138* 139* 119  124  BILITOT 1.5* 1.8* 1.0 0.6 0.2*  PROT 6.4* 6.2* 6.2* 5.2* 5.4*  ALBUMIN 3.7 3.2* 3.2* 2.4* 2.4*   No results for input(s): LIPASE, AMYLASE in the last 168 hours. Recent Labs  Lab 12/20/20 0438  AMMONIA 10   Cardiac Enzymes: No results for input(s): CKTOTAL, CKMB, CKMBINDEX, TROPONINI in the last 168 hours. BNP (last 3 results) No results for input(s): BNP in the last 8760 hours.  ProBNP (last 3 results) No results for input(s): PROBNP in the last 8760 hours.  CBG: No results for input(s): GLUCAP in the last 168 hours. Recent Results (from the past 240 hour(s))  Blood Culture (routine x 2)     Status: Abnormal   Collection Time: 12/18/20  5:10 PM   Specimen: BLOOD RIGHT FOREARM  Result Value Ref Range Status   Specimen Description   Final    BLOOD RIGHT FOREARM Performed at [facility], [address] [place], [place] [address]    Special Requests   Final    BOTTLES DRAWN AEROBIC ONLY Blood Culture results may not be optimal due to an inadequate volume of blood received in culture bottles Performed at [facility]    Culture  Setup Time   Final    AEROBIC BOTTLE ONLY GRAM POSITIVE COCCI CRITICAL RESULT CALLED TO, READ BACK BY AND VERIFIED WITH: E JACKSON PHARMD 12/20/20 0301 JDW    Culture (A)  Final    STAPHYLOCOCCUS EPIDERMIDIS THE SIGNIFICANCE OF ISOLATING THIS ORGANISM FROM A SINGLE SET OF BLOOD CULTURES WHEN MULTIPLE SETS ARE DRAWN IS UNCERTAIN. PLEASE NOTIFY THE MICROBIOLOGY DEPARTMENT WITHIN ONE WEEK IF SPECIATION AND SENSITIVITIES ARE REQUIRED. Performed at [facility], [place] [address], [place], [place] [address]    Report Status 12/21/2020 FINAL  Final  Blood Culture ID Panel (Reflexed)     Status: Abnormal   Collection Time: 12/18/20  5:10 PM  Result Value Ref Range Status   Enterococcus faecalis NOT DETECTED NOT DETECTED Final   Enterococcus Faecium NOT DETECTED NOT DETECTED Final   Listeria monocytogenes NOT DETECTED NOT  DETECTED Final   Staphylococcus species DETECTED (A) NOT DETECTED Final  Comment: CRITICAL RESULT CALLED TO, READ BACK BY AND VERIFIED WITH: E JACKSON PHARMD 12/20/20 0301 JDW    Staphylococcus aureus (BCID) NOT DETECTED NOT DETECTED Final   Staphylococcus epidermidis DETECTED (A) NOT DETECTED Final    Comment: CRITICAL RESULT CALLED TO, READ BACK BY AND VERIFIED WITH: E JACKSON PHARMD 12/20/20 0301 JDW    Staphylococcus lugdunensis NOT DETECTED NOT DETECTED Final   Streptococcus species NOT DETECTED NOT DETECTED Final   Streptococcus agalactiae NOT DETECTED NOT DETECTED Final   Streptococcus pneumoniae NOT DETECTED NOT DETECTED Final   Streptococcus pyogenes NOT DETECTED NOT DETECTED Final   A.calcoaceticus-baumannii NOT DETECTED NOT DETECTED Final   Bacteroides fragilis NOT DETECTED NOT DETECTED Final   Enterobacterales NOT DETECTED NOT DETECTED Final   Enterobacter cloacae complex NOT DETECTED NOT DETECTED Final   Escherichia coli NOT DETECTED NOT DETECTED Final   Klebsiella aerogenes NOT DETECTED NOT DETECTED Final   Klebsiella oxytoca NOT DETECTED NOT DETECTED Final   Klebsiella pneumoniae NOT DETECTED NOT DETECTED Final   Proteus species NOT DETECTED NOT DETECTED Final   Salmonella species NOT DETECTED NOT DETECTED Final   Serratia marcescens NOT DETECTED NOT DETECTED Final   Haemophilus influenzae NOT DETECTED NOT DETECTED Final   Neisseria meningitidis NOT DETECTED NOT DETECTED Final   Pseudomonas aeruginosa NOT DETECTED NOT DETECTED Final   Stenotrophomonas maltophilia NOT DETECTED NOT DETECTED Final   Candida albicans NOT DETECTED NOT DETECTED Final   Candida auris NOT DETECTED NOT DETECTED Final   Candida glabrata NOT DETECTED NOT DETECTED Final   Candida krusei NOT DETECTED NOT DETECTED Final   Candida parapsilosis NOT DETECTED NOT DETECTED Final   Candida tropicalis NOT DETECTED NOT DETECTED Final   Cryptococcus neoformans/gattii NOT DETECTED NOT DETECTED Final    Methicillin resistance mecA/C NOT DETECTED NOT DETECTED Final    Comment: Performed at [facility], [address], [place], [place] [address]  Resp Panel by RT-PCR (Flu A&B, Covid) Nasopharyngeal Swab     Status: None   Collection Time: 12/18/20  5:36 PM   Specimen: Nasopharyngeal Swab; Nasopharyngeal(NP) swabs in vial transport medium  Result Value Ref Range Status   SARS Coronavirus 2 by RT PCR NEGATIVE NEGATIVE Final    Comment: (NOTE) SARS-CoV-2 target nucleic acids are NOT DETECTED.  The SARS-CoV-2 RNA is generally detectable in upper respiratory specimens during the acute phase of infection. The lowest concentration of SARS-CoV-2 viral copies this assay can detect is 138 copies/mL. A negative result does not preclude SARS-Cov-2 infection and should not be used as the sole basis for treatment or other patient management decisions. A negative result may occur with  improper specimen collection/handling, submission of specimen other than nasopharyngeal swab, presence of viral mutation(s) within the areas targeted by this assay, and inadequate number of viral copies(<138 copies/mL). A negative result must be combined with clinical observations, patient history, and epidemiological information. The expected result is Negative.  Fact Sheet for Patients:  EntrepreneurPulse.com.au  Fact Sheet for Healthcare Providers:  IncredibleEmployment.be  This test is no t yet approved or cleared by the [place] FDA and  has been authorized for detection and/or diagnosis of SARS-CoV-2 by FDA under an Emergency Use Authorization (EUA). This EUA will remain  in effect (meaning this test can be used) for the duration of the COVID-19 declaration under Section 564(b)(1) of the Act, 21 U.S.C.section 360bbb-3(b)(1), unless the authorization is terminated  or revoked sooner.       Influenza A by PCR NEGATIVE NEGATIVE  Final   Influenza B by PCR  NEGATIVE NEGATIVE Final    Comment: (NOTE) The Xpert Xpress SARS-CoV-2/FLU/RSV plus assay is intended as an aid in the diagnosis of influenza from Nasopharyngeal swab specimens and should not be used as a sole basis for treatment. Nasal washings and aspirates are unacceptable for Xpert Xpress SARS-CoV-2/FLU/RSV testing.  Fact Sheet for Patients: EntrepreneurPulse.com.au  Fact Sheet for Healthcare Providers: IncredibleEmployment.be  This test is not yet approved or cleared by the [place] FDA and has been authorized for detection and/or diagnosis of SARS-CoV-2 by FDA under an Emergency Use Authorization (EUA). This EUA will remain in effect (meaning this test can be used) for the duration of the COVID-19 declaration under Section 564(b)(1) of the Act, 21 U.S.C. section 360bbb-3(b)(1), unless the authorization is terminated or revoked.  Performed at [facility]   Blood Culture (routine x 2)     Status: None   Collection Time: 12/18/20  5:38 PM   Specimen: BLOOD  Result Value Ref Range Status   Specimen Description BLOOD LEFT ANTECUBITAL  Final   Special Requests   Final    Blood Culture adequate volume BOTTLES DRAWN AEROBIC AND ANAEROBIC   Culture   Final    NO GROWTH 5 DAYS Performed at [facility], [place] [address], [place], [place] [address]    Report Status 12/23/2020 FINAL  Final  Culture, Urine     Status: None   Collection Time: 12/19/20 10:30 AM   Specimen: Urine, Random  Result Value Ref Range Status   Specimen Description   Final    URINE, RANDOM Performed at [place] [address], [place], [place] [address]    Special Requests   Final    NONE Performed at [facility], [place] [address] [place], [place] [address]    Culture   Final    NO GROWTH Performed at [facility], [place] [address], [place], [place] [address]    Report Status 12/20/2020 FINAL   Final  MRSA PCR Screening     Status: None   Collection Time: 12/24/20 10:07 AM   Specimen: Nasal Mucosa; Nasopharyngeal  Result Value Ref Range Status   MRSA by PCR NEGATIVE NEGATIVE Final    Comment:        The GeneXpert MRSA Assay (FDA approved for NASAL specimens only), is one component of a comprehensive MRSA colonization surveillance program. It is not intended to diagnose MRSA infection nor to guide or monitor treatment for MRSA infections. Performed at [facility], [place] [address] [place], [place] [address]      Studies: No results found.   Flora Lipps, MD  [facility] 12/25/2020  If 7PM-7AM, please contact night-coverage

## 2020-12-25 NOTE — Progress Notes (Signed)
Physical Therapy Treatment Patient Details Name: Sean [name] MRN: [id number] DOB: 04-26-1966 Today's Date: 12/25/2020    History of Present Illness Sean [name] is a 55 y.o. male.  Presents to ER with concern for fever following cancer treatment on 12/18/20.  Pt admitted for Severe sepsis due to community-acquired pneumonia.  Patient has a history of metastatic melanoma.  Extensive brain mets.    PT Comments    Pt provided with multimodal cues for instruction.  Pt assisted with ambulating in hallway 80 feet with RW and requiring assist for stability.  Continue to recommend post acute rehab.    Follow Up Recommendations  SNF     Equipment Recommendations  None recommended by PT    Recommendations for Other Services       Precautions / Restrictions Precautions Precautions: Fall Precaution Comments: cognitive deficits - doesn't follow verbal commands well    Mobility  Bed Mobility Overal bed mobility: Independent                  Transfers Overall transfer level: Needs assistance Equipment used: None Transfers: Sit to/from Stand Sit to Stand: Min assist         General transfer comment: steadying assist provided as pt did not use RW despite its proximity  Ambulation/Gait Ambulation/Gait assistance: Min assist Gait Distance (Feet): 80 Feet Assistive device: Rolling walker (2 wheeled) Gait Pattern/deviations: Step-to pattern;Decreased stride length;Ataxic;Narrow base of support     General Gait Details: very unsteady gait requiring assist for stabilizing especially with turning or with pt scissoring or narrowing BOS in any way; pt not using righting reactions and requiring assist for balance.   Stairs             Wheelchair Mobility    Modified Rankin (Stroke Patients Only)       Balance           Standing balance support: Bilateral upper extremity supported Standing balance-Leahy Scale: Poor                               Cognition Arousal/Alertness: Awake/alert Behavior During Therapy: WFL for tasks assessed/performed Overall Cognitive Status: History of cognitive impairments - at baseline                                 General Comments: Requires multimodal cues to perform any task and follow instructions. Appears to be his baseline cognition.      Exercises      General Comments        Pertinent Vitals/Pain Pain Assessment: Faces Faces Pain Scale: No hurt    Home Living                      Prior Function            PT Goals (current goals can now be found in the care plan section) Progress towards PT goals: Progressing toward goals    Frequency    Min 2X/week      PT Plan Current plan remains appropriate    Co-evaluation              AM-PAC PT "6 Clicks" Mobility   Outcome Measure  Help needed turning from your back to your side while in a flat bed without using bedrails?: A Lot Help needed moving from lying on your back to sitting  on the side of a flat bed without using bedrails?: A Lot Help needed moving to and from a bed to a chair (including a wheelchair)?: Total Help needed standing up from a chair using your arms (e.g., wheelchair or bedside chair)?: Total Help needed to walk in hospital room?: Total Help needed climbing 3-5 steps with a railing? : Total 6 Click Score: 8    End of Session Equipment Utilized During Treatment: Gait belt Activity Tolerance: Patient tolerated treatment well Patient left: with call bell/phone within reach;in chair;with chair alarm set;with nursing/sitter in room (sitter present)   PT Visit Diagnosis: Unsteadiness on feet (R26.81);Difficulty in walking, not elsewhere classified (R26.2)     Time: 5301-0404 PT Time Calculation (min) (ACUTE ONLY): 16 min  Charges:  $Gait Training: 8-22 mins                    Jannette Spanner PT, DPT Acute Rehabilitation Services Pager: [phone] Office:  [phone]  York Ram E 12/25/2020, 12:29 PM

## 2020-12-25 NOTE — Consult Note (Addendum)
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 After going through his chart, I do not see any other apparent causes of AKI currently. At this junction (especially as we are working him for other causes of AKI), would favor transition to prednisone vs increased decadron dose. If Cr is not better by tomorrow, then would favor increased glucocorticoids -tentatively planning for biopsy if aforementioned tests do not yield any potential etiologies (looking for acute tubulointerstitial nephritis with infiltrating neutrophils and lymphocytes) -if PO intake is truly poor, then would favor mIVF for him  -Continue to monitor daily Cr, Dose meds for GFR<15 -Monitor Daily I/Os, Daily weight, daily labs  -Maintain MAP>65 for optimal renal perfusion.  -Avoid further nephrotoxins including NSAIDS, Morphine.  Unless absolutely necessary, avoid CT with contrast and/or MRI with gadolinium.     Severe Sepsis secondary to ssuspected CAP -s/p 5 day course of rocephin and zithromax. Remains afebrile  Metastatic melanoma -multiorgan involvement, receiving nivolumab, s/p cycle 4 on 12/18/20. 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 Ritalin d/c'ed  Discussed with wife Kamari Buch) over the phone. She agrees with potential kidney biopsy if it's needed.  [facility] 12/25/2020 1:48  PM   _____________________________________________________________________________________   History of Present Illness: Sean [name] is a 55 y.o. male with a past medical history of metastatic melanoma (multiorgan metastasis including brain mets), seizure d/o 2/2 brain  Mets, h/o craniotomy, HLD who presents to [facility] from SNF with fevers. In regards to his metastatic melanoma, patient had received cycle 1 with ipilimumab/nivolumab on 10/16/20 (ipilimumab was given with cycle 1 only). Patient last received cycle 4 with nivolumab on 12/18/20. He has had radiographic response to therapy (on restaging CT). Patient presented to the ER on 4/4 shortly after his nivolumab infusion. He had developed a fever on 101.4 after his treatment.  There was a concern for sepsis secondary to CAP therefore he completed a course of rocephin and zithromax. He did have one set of blood cultures positive for staph epi. Patient presented with AKI with a Cr of 1.2 for which he received fluids. Cr had stablized around 1.5-1.6 however fluids were stopped on 4/9 and now his Cr today is 2.2. Patient's LFT's were also elevated and has since improved. He did have one episode of relative hypotension (89/62 but MAP 70) on 4/10 otherwise has been hemodynamically stable. His post void residual on bladder scan was only around 90cc's per primary. 1:1 sitter/aid is currently at his bedside. Apparently, he did have some nausea/vomiting with breakfast which resolved. Able to eat a little of his lunch. At baseline, patient is confused/has cognitive impairment. When prompted, patient says no to pain, cp, sob, nausea.    Medications:  Current Facility-Administered Medications  Medication Dose Route Frequency Provider Last Rate Last Admin  . citalopram (CELEXA) tablet 10 mg  10 mg Oral QHS Wendee Beavers T, MD   10 mg at 12/24/20 2145  . dexamethasone (DECADRON) tablet 2 mg  2 mg Oral Daily Mercy Riding, MD  2 mg at 12/25/20 0955  . feeding  supplement (ENSURE ENLIVE / ENSURE PLUS) liquid 237 mL  237 mL Oral TID BM Pokhrel, Laxman, MD   237 mL at 12/25/20 0955  . levETIRAcetam (KEPPRA) tablet 500 mg  500 mg Oral BID Wendee Beavers T, MD   500 mg at 12/25/20 0955  . LORazepam (ATIVAN) tablet 0.5 mg  0.5 mg Oral Q6H PRN Pokhrel, Laxman, MD   0.5 mg at 12/25/20 1256   Or  . LORazepam (ATIVAN) injection 0.5 mg  0.5 mg Intramuscular Q6H PRN Pokhrel, Laxman, MD      . melatonin tablet 5 mg  5 mg Oral QHS PRN Wendee Beavers T, MD   5 mg at 12/23/20 2112  . methylphenidate (RITALIN) tablet 5 mg  5 mg Oral BID WC Wendee Beavers T, MD   5 mg at 12/25/20 1252  . multivitamin with minerals tablet 1 tablet  1 tablet Oral Daily Mercy Riding, MD   1 tablet at 12/25/20 0955  . ondansetron (ZOFRAN) tablet 4 mg  4 mg Oral Q6H PRN Gonfa, Taye T, MD       Or  . ondansetron (ZOFRAN) injection 4 mg  4 mg Intravenous Q6H PRN Wendee Beavers T, MD   4 mg at 12/25/20 1018  . pantoprazole (PROTONIX) EC tablet 40 mg  40 mg Oral Daily Wendee Beavers T, MD   40 mg at 12/25/20 0954  . senna-docusate (Senokot-S) tablet 2 tablet  2 tablet Oral QHS Mercy Riding, MD   2 tablet at 12/24/20 2144     ALLERGIES Patient has no known allergies.  MEDICAL HISTORY Past Medical History:  Diagnosis Date  . Brain lesion 09/14/2020  . Cancer ([place])    Malignant melanoma to his brain  . Hyperlipidemia   . Liver lesion 09/14/2020     SOCIAL HISTORY Social History   Socioeconomic History  . Marital status: Married    Spouse name: Not on file  . Number of children: 0  . Years of education: Not on file  . Highest education level: Not on file  Occupational History  . Occupation: Heritage manager: [employer]  Tobacco Use  . Smoking status: Former Research scientist (life sciences)  . Smokeless tobacco: Never Used  . Tobacco comment: 09/27/20: per patient quit about over 20 years ago   Vaping Use  . Vaping Use: Never used  Substance and Sexual Activity  . Alcohol use: Not Currently  . Drug  use: No  . Sexual activity: Yes    Partners: Female  Other Topics Concern  . Not on file  Social History Narrative  . Not on file   Social Determinants of Health   Financial Resource Strain: High Risk  . Difficulty of Paying Living Expenses: Hard  Food Insecurity: Not on file  Transportation Needs: Not on file  Physical Activity: Not on file  Stress: Not on file  Social Connections: Not on file  Intimate Partner Violence: Not on file     FAMILY HISTORY Family History  Problem Relation Age of Onset  . Osteoarthritis Mother   . Hypertension Father   . Colon polyps Father   . Colon polyps Brother   . Colon polyps Brother   . Colon cancer Neg Hx   . Stomach cancer Neg Hx   . Rectal cancer Neg Hx   . Esophageal cancer Neg Hx   . Liver cancer Neg Hx     Review of Systems: 12 systems  reviewed Otherwise as per HPI, all other systems reviewed and negative  Physical Exam: Vitals:   12/24/20 2248 12/25/20 0606  BP: 111/73 113/73  Pulse: 78 99  Resp: 16 18  Temp: 98 F (36.7 C) 98.8 F (37.1 C)  SpO2: 96% 97%   No intake/output data recorded.  Intake/Output Summary (Last 24 hours) at 12/25/2020 1348 Last data filed at 12/25/2020 1460 Gross per 24 hour  Intake 660 ml  Output 400 ml  Net 260 ml   General: NAD, laying flat in bed HEENT: anicteric sclera, oropharynx clear without lesions, MMM CV: regular rate, normal rhythm, no murmurs, no gallops, no rubs, no peripheral edema Lungs: clear to auscultation bilaterally, normal work of breathing Abd: soft, non-tender, non-distended Skin: no visible lesions or rashes Musculoskeletal: no edema bl LE's, no obvious deformities Neuro: moves all ext spontaneously, restless  Test Results Reviewed Lab Results  Component Value Date   NA 138 12/25/2020   K 3.4 (L) 12/25/2020   CL 107 12/25/2020   CO2 23 12/25/2020   BUN 13 12/25/2020   CREATININE 2.18 (H) 12/25/2020   CALCIUM 8.3 (L) 12/25/2020   ALBUMIN 2.4 (L)  12/23/2020   PHOS 3.3 12/23/2020     I have reviewed all relevant outside healthcare records related to the patient's kidney injury.

## 2020-12-25 NOTE — Progress Notes (Addendum)
IP PROGRESS NOTE  Subjective:   Sean [name] vomited just prior to my arrival. He reports having nausea. No other complaints today.   Objective: Vital signs in last 24 hours: Blood pressure 113/73, pulse 99, temperature 98.8 F (37.1 C), temperature source Oral, resp. rate 18, height 6' (1.829 m), weight 79.3 kg, SpO2 97 %.  Intake/Output from previous day: 04/10 0701 - 04/11 0700 In: 780 [P.O.:780] Out: 1200 [Urine:1200]  Physical Exam:  HEENT: No thrush Abdomen: Nontender, no hepatosplenomegaly Extremities: No leg edema Skin: No rash Neurologic: Alert, follows simple commands, moves extremities to command  Lab Results: Recent Labs    12/23/20 0451  WBC 9.0  HGB 10.8*  HCT 32.5*  PLT 212    BMET Recent Labs    12/23/20 0451 12/25/20 0502  NA 145 138  K 3.9 3.4*  CL 116* 107  CO2 20* 23  GLUCOSE 91 100*  BUN 12 13  CREATININE 1.50* 2.18*  CALCIUM 8.0* 8.3*    Lab Results  Component Value Date   CEA1 0.6 09/15/2020    Medications: I have reviewed the patient's current medications.  Assessment/Plan:  1. 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, unknown primary tumor site  CT brain 09/14/2020-multiple hemorrhagic metastases with surrounding edema in the bilateral cerebral hemispheres  CTs chest, abdomen, pelvis 09/14/2020-1.4 cm right upper lobe subpleural nodule, 0.5 cm right lower lobe nodule, multiple hypodensities in the liver suspicious for metastases, 1.5 cm right upper pole hypodense kidney lesion-indeterminate  MRI abdomen 09/15/2020-multifocal T1 hyperintense liver lesions, no kidney mass  Ultrasound-guided biopsy of a segment 4B liver lesion 09/18/2020-benign liver parenchyma with steatosis, no evidence of malignancy  PET 09/25/2020-focal area of hypermetabolism in the right hilum, multiple areas of the liver, pelvic musculature, and left thigh-no CT correlate with any of these areas on the noncontrast CT, 10 mm  anterior right upper lobe nodule with low-level hypermetabolism, additional tiny pulmonary nodules too small to characterize by PET  SRS to 10 brain lesions 09/27/2020  Resection of left occipital and left frontal lesions on 09/29/2020-pathology consistent with malignant melanoma  Cycle 1 ipilimumab/nivolumab 10/16/2020  CT of the brain without contrast 10/26/2020 showed significant interval increase in the number and size of numerous metastases  Brain MRI 10/24/2020-multiple new and increased brain metastases, moderate edema surrounding multiple lesions, new mass-effect at the right lateral ventricle  Brain MRI 11/01/2020-stable appearance of his multiple intracranial metastases and associated edema and mild mass-effect.  CT of the chest with contrast 11/01/2020-interval progression of bilateral pulmonary nodules concerning for metastatic disease, small right hilar lymph nodes, new and progressive ill-defined lesions in the liver compatible with progression of metastatic involvement.  Cycle 2 nivolumab 11/10/2020  Cycle 3 nivolumab 11/27/2020  MRI brain 12/13/2020-decrease in size of hemorrhagic brain metastases, improvement in vasogenic edema and mass-effect, new areas of nonhemorrhagic enhancement by frontal and right parietal white matter-nonspecific  CT chest 12/08/2020-decreased size of lung and liver metastases  Cycle 4 nivolumab 12/18/2020 2.Left-sided weakness, new onset seizure secondary to #1, weakness has resolved 3.Hyperlipidemia 4.Elevated liver enzymes and bilirubin  Liver enzymes persistently elevated 5.Headache and nausea/vomiting secondary to #1, resolved 6. Hospital admission 10/20/2020-encephalopathy, slow Decadron taper-down to 2 mg at discharge 11/09/2020 7.  Admission 12/18/2020 with fever  Sean [name] appears unchanged.  His neurologic status appears at baseline over the past several weeks..  The creatinine and liver enzymes were more elevated this admission.  This  could be related to toxicity from nivolumab or infection.  The liver enzymes have improved as of 4/9-not checked today.  However the liver enzymes were significantly elevated prior to beginning treatment in January.  Creatinine stable.  He has an incurable malignancy.  There has been improvement of the metastatic melanoma on recent imaging studies.  I support him remaining on a no CODE BLUE status.  He is now afebrile.  1 blood culture from admission returned positive for staph epidermidis, likely contaminant.  He has developed nausea/vomiting this am.  This is unlikely related to his nivolumab.  He has as needed Zofran available to him.  Recommendations: 1.  Continue OT/PT, return to the skilled nursing facility at discharge 2.  Continue Decadron and Keppra 3.  Use as needed Zofran for nausea and vomiting. 4.  Follow-up as scheduled at the Cancer center for the next cycle of nivolumab on 01/01/2021.  5.  Repeat urinalysis, consider nephrology consult   LOS: 6 days   Sean Bussing, NP   12/25/2020, 8:08 AM Sean [name] was interviewed and examined.  He appeared well when I saw him early this morning.  He remains confused with his mental status at baseline.  The liver enzymes have improved.  He remains afebrile and there is no apparent source for infection.  The creatinine is higher today.  The etiology of the elevated creatinine is unclear, but he could have nivolumab induced nephritis.  He could also have acute kidney injury related to the admission with a febrile illness.  I recommend repeating a urinalysis and considering a nephrology consult.  I discussed the case with his wife by telephone.

## 2020-12-25 NOTE — Progress Notes (Addendum)
UA w/ microscopy. Signifant WBCs but rare bacteria. Raises the suspicion for nivolumab induced nephritis even more (albeit renal u/s pending). Case discussed with Dr. [name]. Will stop decadron and start with a lower prednisone dose: 40mg  daily starting tomorrow. There is a concern for blunted immune response secondary to steroids given the fact that he is responding to nivolumab. Would need a definitive diagnosis via renal biopsy as this will dictate whether we can taper him quickly or not (I.e. if not interstitial nephritis, then can taper rather quickly back down to decadron 2mg  daily). Will place a consult for IR for biopsy in AM. Recommendations/plan conveyed to primary service.  Gean Quint, MD [facility]

## 2020-12-25 NOTE — Plan of Care (Signed)
  Problem: Activity: Goal: Risk for activity intolerance will decrease Outcome: Progressing   Problem: Coping: Goal: Level of anxiety will decrease Outcome: Progressing   Problem: Pain Managment: Goal: General experience of comfort will improve Outcome: Progressing   Problem: Safety: Goal: Ability to remain free from injury will improve Outcome: Progressing   Problem: Skin Integrity: Goal: Risk for impaired skin integrity will decrease Outcome: Progressing   Problem: Respiratory: Goal: Ability to maintain adequate ventilation will improve Outcome: Progressing

## 2020-12-26 ENCOUNTER — Inpatient Hospital Stay (HOSPITAL_COMMUNITY): Payer: 59

## 2020-12-26 DIAGNOSIS — R451 Restlessness and agitation: Secondary | ICD-10-CM

## 2020-12-26 DIAGNOSIS — C7931 Secondary malignant neoplasm of brain: Secondary | ICD-10-CM | POA: Diagnosis not present

## 2020-12-26 DIAGNOSIS — N179 Acute kidney failure, unspecified: Secondary | ICD-10-CM

## 2020-12-26 DIAGNOSIS — J189 Pneumonia, unspecified organism: Secondary | ICD-10-CM | POA: Diagnosis not present

## 2020-12-26 DIAGNOSIS — A419 Sepsis, unspecified organism: Secondary | ICD-10-CM | POA: Diagnosis not present

## 2020-12-26 DIAGNOSIS — R7989 Other specified abnormal findings of blood chemistry: Secondary | ICD-10-CM | POA: Diagnosis not present

## 2020-12-26 DIAGNOSIS — R6520 Severe sepsis without septic shock: Secondary | ICD-10-CM | POA: Diagnosis not present

## 2020-12-26 LAB — CBC
HCT: 33.8 % — ABNORMAL LOW (ref 39.0–52.0)
Hemoglobin: 11.4 g/dL — ABNORMAL LOW (ref 13.0–17.0)
MCH: 32 pg (ref 26.0–34.0)
MCHC: 33.7 g/dL (ref 30.0–36.0)
MCV: 94.9 fL (ref 80.0–100.0)
Platelets: 255 10*3/uL (ref 150–400)
RBC: 3.56 MIL/uL — ABNORMAL LOW (ref 4.22–5.81)
RDW: 13.7 % (ref 11.5–15.5)
WBC: 7.7 10*3/uL (ref 4.0–10.5)
nRBC: 0 % (ref 0.0–0.2)

## 2020-12-26 LAB — COMPREHENSIVE METABOLIC PANEL
ALT: 110 U/L — ABNORMAL HIGH (ref 0–44)
AST: 50 U/L — ABNORMAL HIGH (ref 15–41)
Albumin: 2.8 g/dL — ABNORMAL LOW (ref 3.5–5.0)
Alkaline Phosphatase: 115 U/L (ref 38–126)
Anion gap: 10 (ref 5–15)
BUN: 19 mg/dL (ref 6–20)
CO2: 24 mmol/L (ref 22–32)
Calcium: 8.4 mg/dL — ABNORMAL LOW (ref 8.9–10.3)
Chloride: 106 mmol/L (ref 98–111)
Creatinine, Ser: 2.42 mg/dL — ABNORMAL HIGH (ref 0.61–1.24)
GFR, Estimated: 31 mL/min — ABNORMAL LOW (ref >60)
Glucose, Bld: 106 mg/dL — ABNORMAL HIGH (ref 70–99)
Potassium: 3.6 mmol/L (ref 3.5–5.1)
Sodium: 140 mmol/L (ref 135–145)
Total Bilirubin: 0.7 mg/dL (ref 0.3–1.2)
Total Protein: 5.8 g/dL — ABNORMAL LOW (ref 6.5–8.1)

## 2020-12-26 LAB — PROTEIN / CREATININE RATIO, URINE
Creatinine, Urine: 75.39 mg/dL
Protein Creatinine Ratio: 0.19 mg/mg{Cre} — ABNORMAL HIGH (ref 0.00–0.15)
Total Protein, Urine: 14 mg/dL

## 2020-12-26 LAB — PROTIME-INR
INR: 1 (ref 0.8–1.2)
Prothrombin Time: 13 seconds (ref 11.4–15.2)

## 2020-12-26 LAB — PHOSPHORUS: Phosphorus: 4.9 mg/dL — ABNORMAL HIGH (ref 2.5–4.6)

## 2020-12-26 IMAGING — US US RENAL
1 series · 14 of 25 positions shown · non-contrast
Comparison: CT Abdomen and Pelvis [DATE].

CLINICAL DATA: 54-year-old male with acute renal insufficiency.
Metastatic melanoma.

EXAM:
RENAL / URINARY TRACT ULTRASOUND COMPLETE

[Series 1: us renal · 14 of 36 slices shown]
[im 1/36]
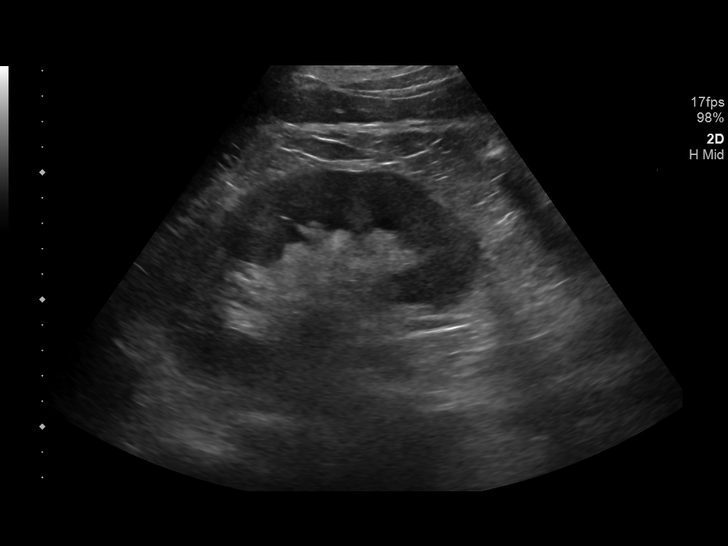
[im 3/36]
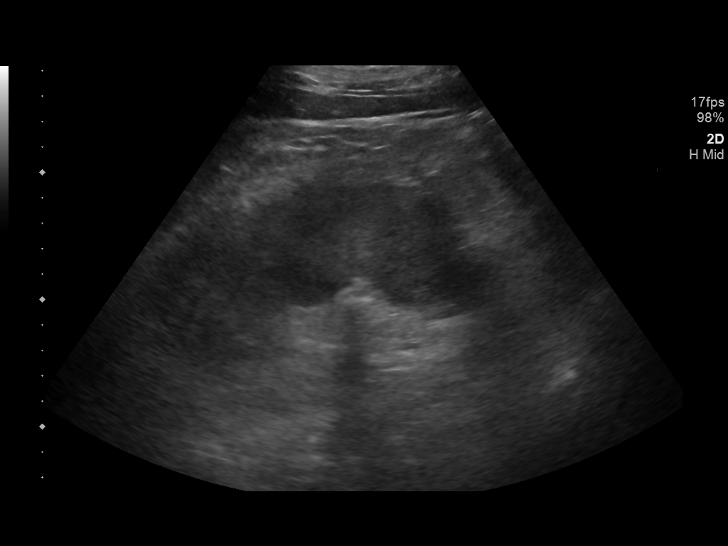
[im 6/36]
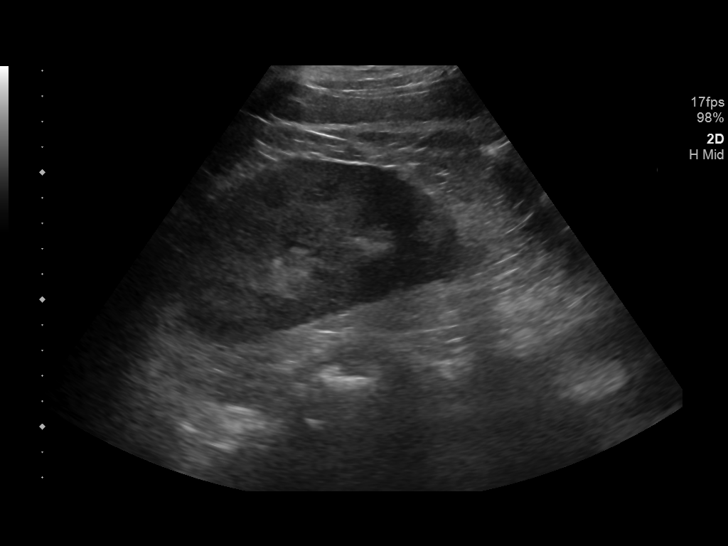
[im 9/36]
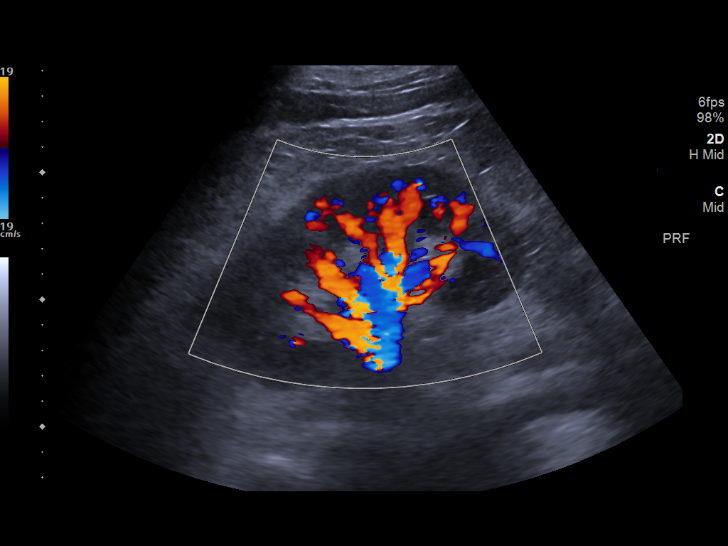
[im 12/36]
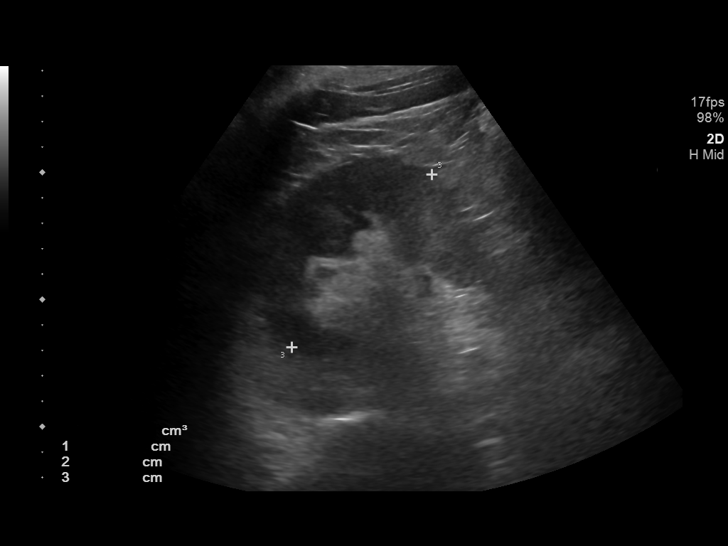
[im 14/36]
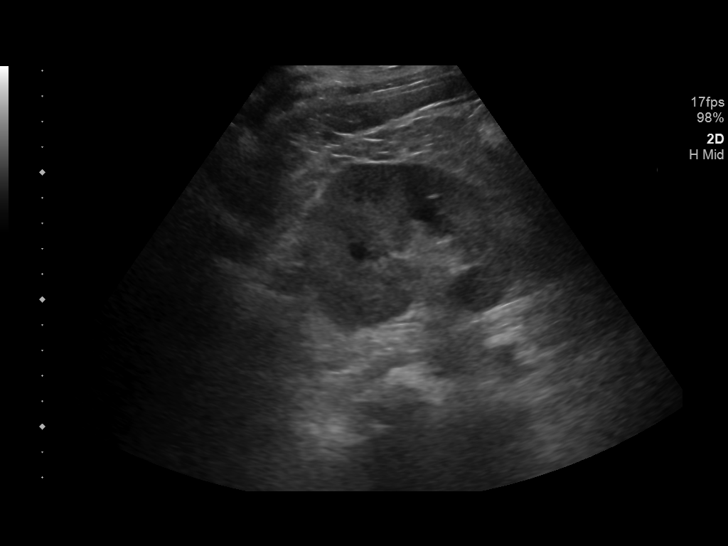
[im 17/36]
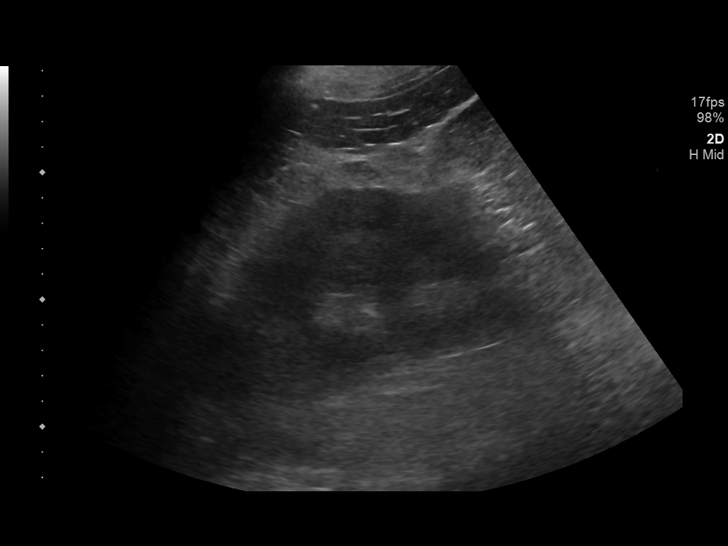
[im 19/36]
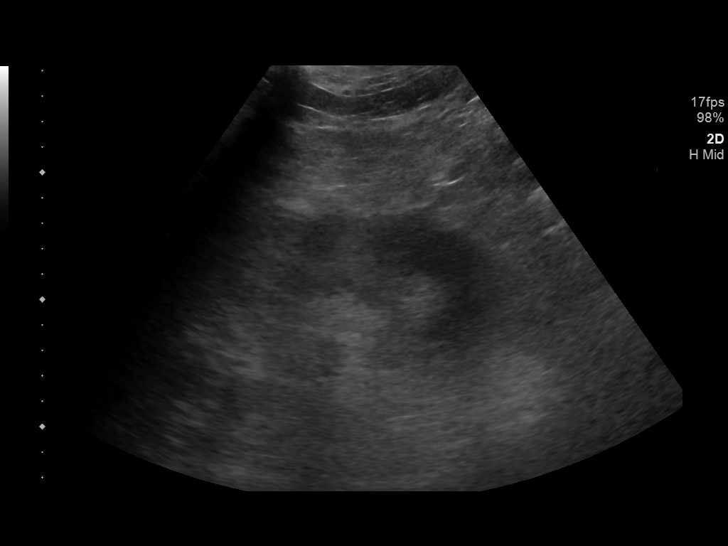
[im 22/36]
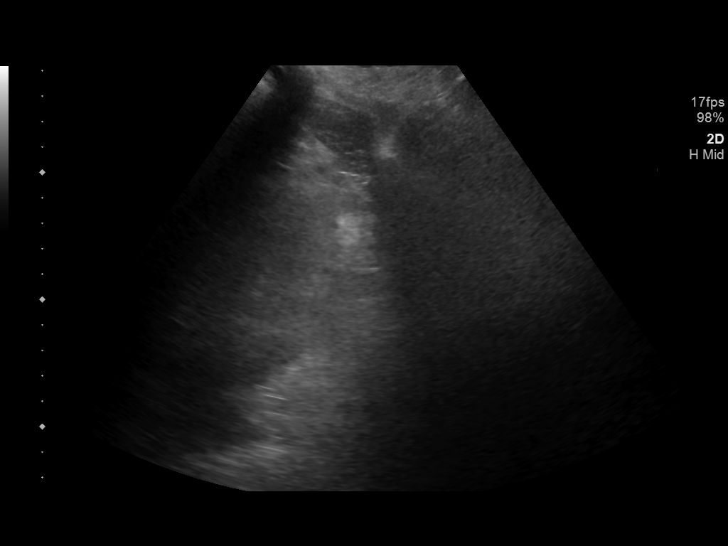
[im 24/36]
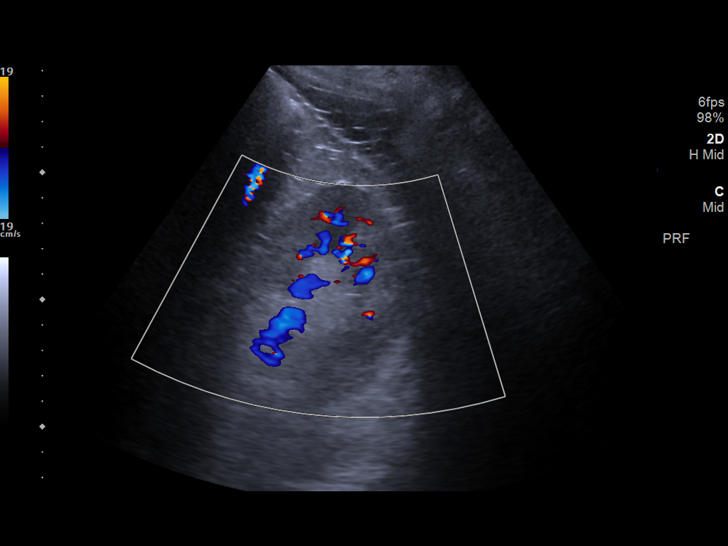
[im 27/36]
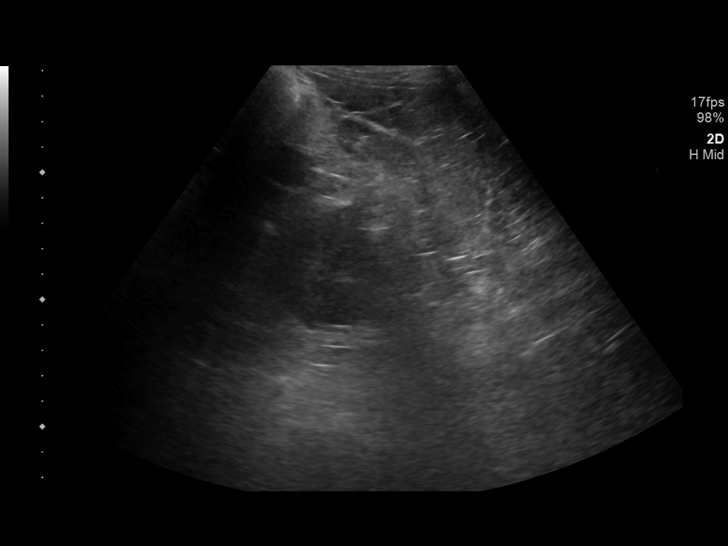
[im 30/36]
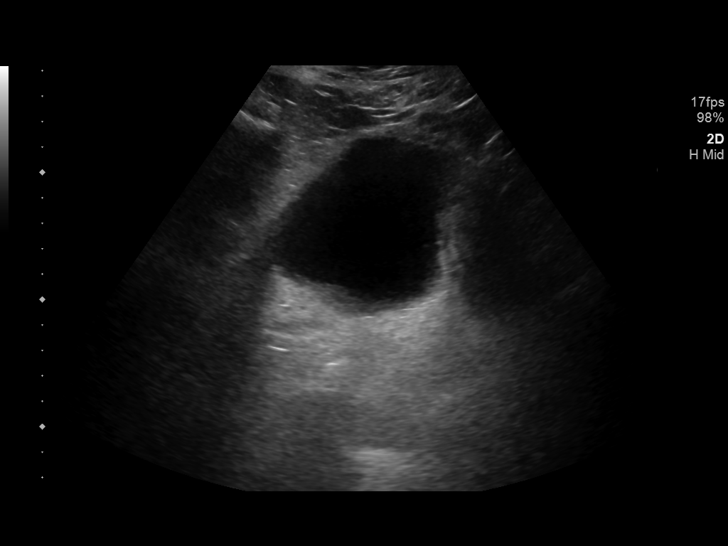
[im 33/36]
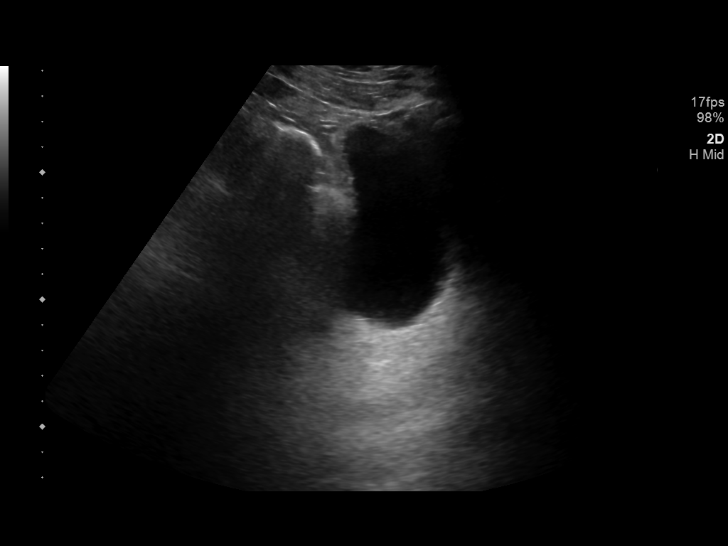
[im 36/36]
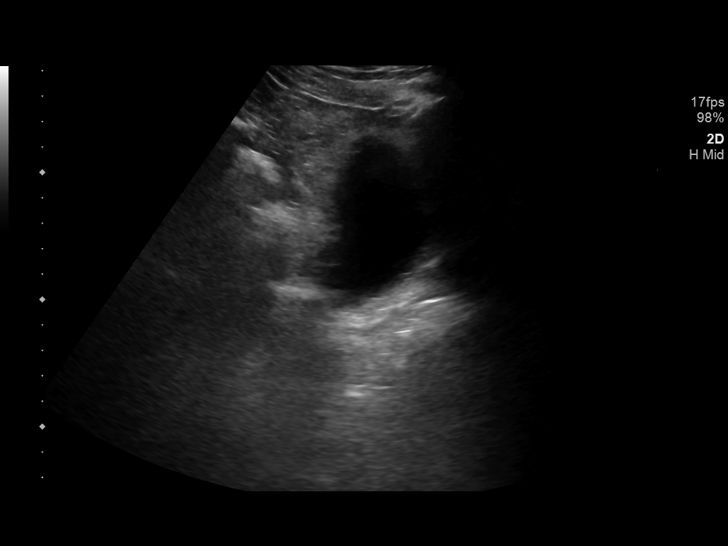

[14 of 25 positions shown; findings below may reference images not displayed]

FINDINGS: Right Kidney:

Renal measurements: 13.3 x 6.7 x 8.8 cm = volume: 406 mL.
Echogenicity within normal limits. No mass or hydronephrosis
visualized.

Left Kidney:

Renal measurements: 13.5 x 7.3 by 6.9 cm = volume: 355 mL.
Echogenicity within normal limits. No mass or hydronephrosis
visualized.

Bladder:

Diminutive.  Appears normal for degree of bladder distention.

Other:

None.
IMPRESSION: Normal ultrasound appearance of both kidneys and the urinary
bladder.

## 2020-12-26 IMAGING — US US BIOPSY
1 series · 10 of 10 positions shown · non-contrast
Comparison: None.

INDICATION: 54-year-old male with a history of melanoma metastatic to the brain
and now acute kidney injury. Concern for drug related nephritis. He
presents for random renal biopsy.

EXAM:
ULTRASOUND GUIDED RENAL BIOPSY

[Series 1: us biopsy · 10 of 10 slices shown]
[im 1/10]
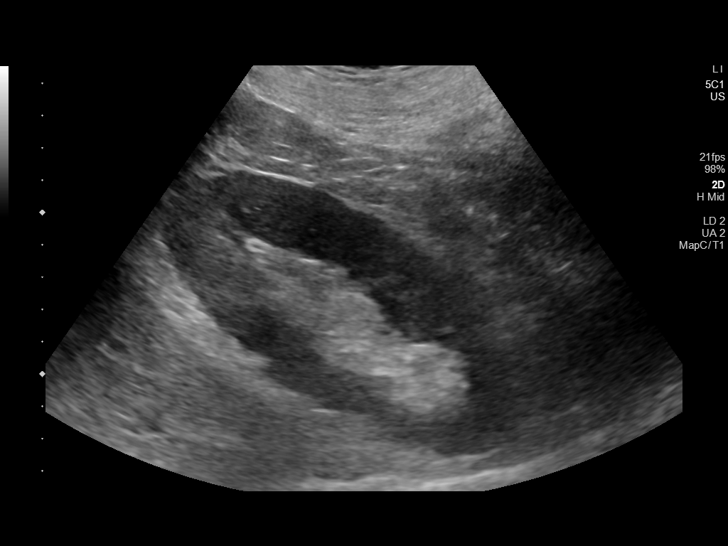
[im 2/10]
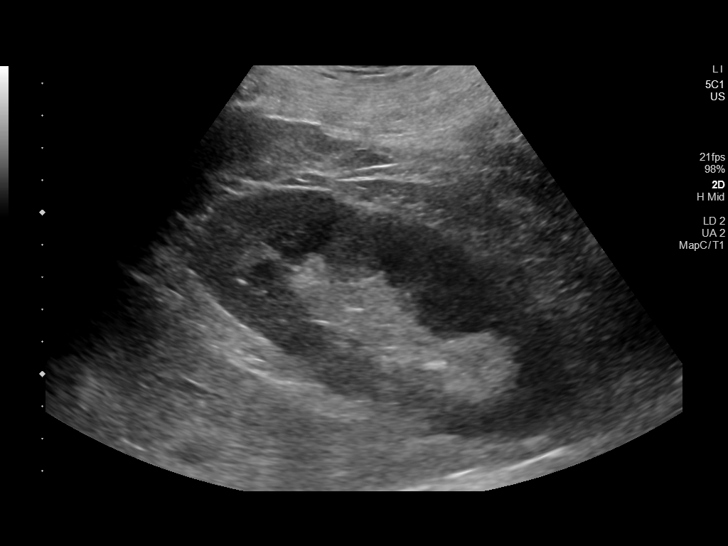
[im 3/10]
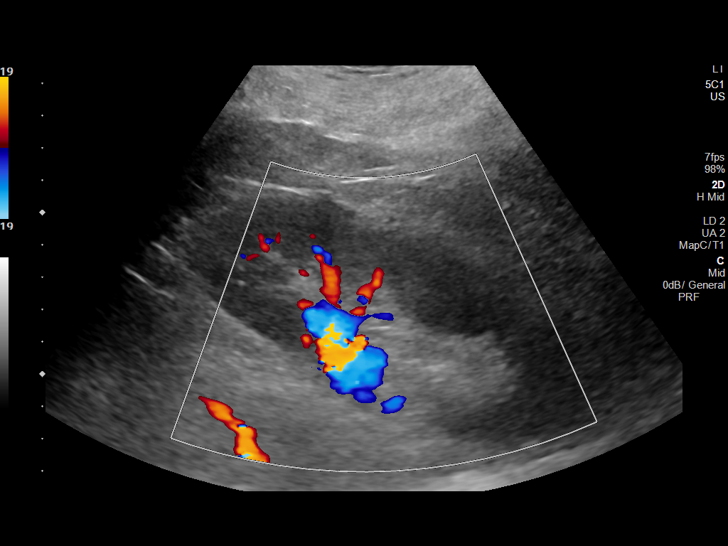
[im 4/10]
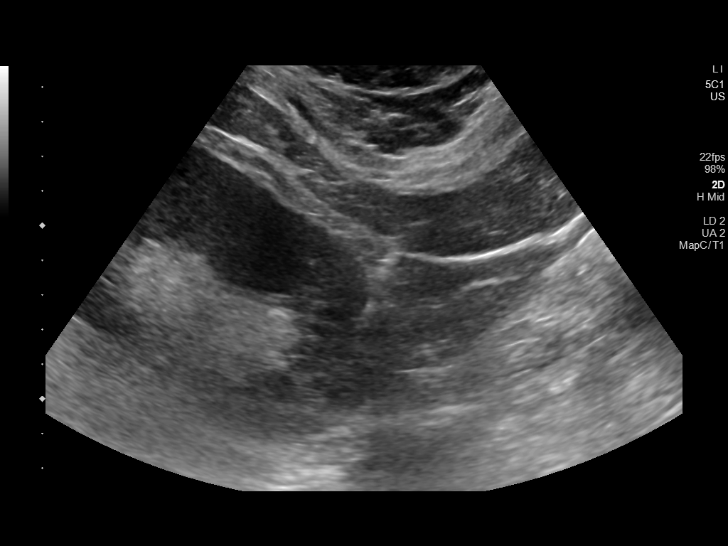
[im 5/10]
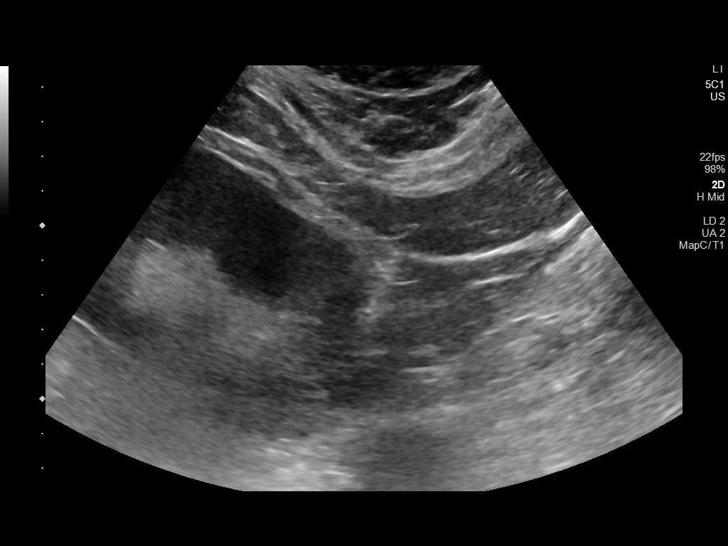
[im 6/10]
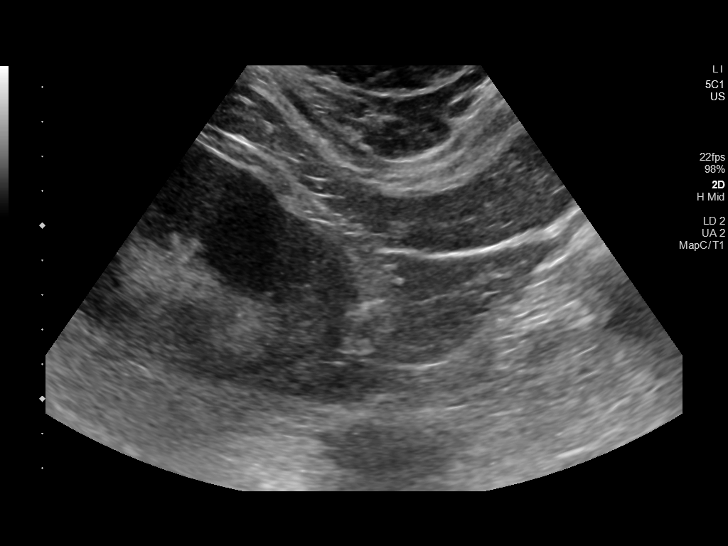
[im 7/10]
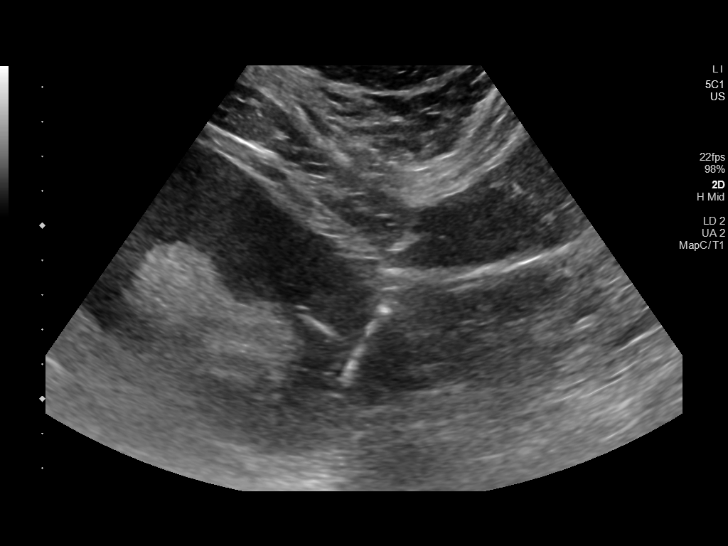
[im 8/10]
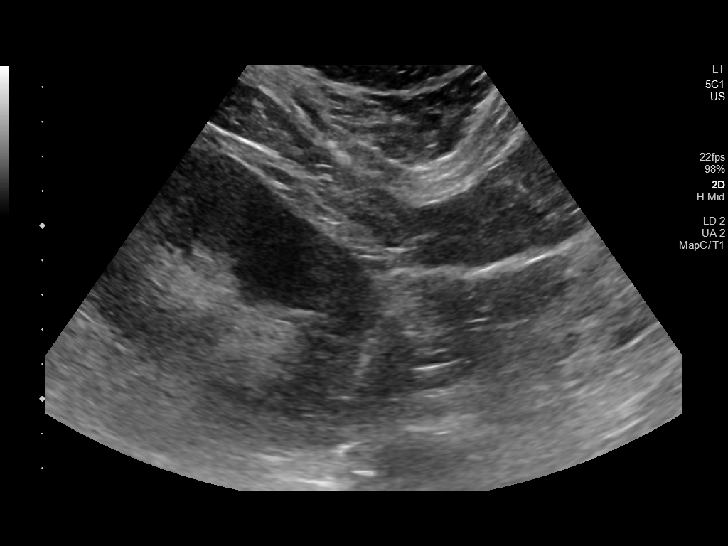
[im 9/10]
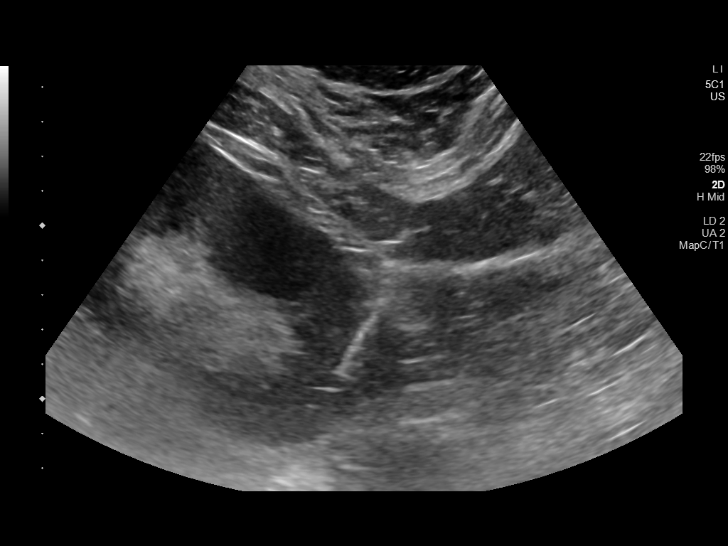
[im 10/10]
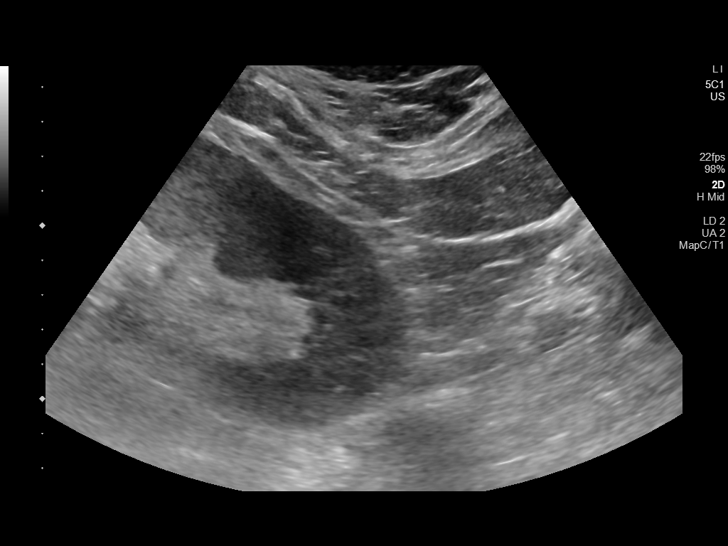

[10 of 10 positions shown; findings below may reference images not displayed]

MEDICATIONS:
Fentanyl 50 mcg IV; Versed 4 mg IV

ANESTHESIA/SEDATION:
Total Moderate Sedation time

10 minutes

The patient's vital signs and level of consciousness were monitored
continuously by radiology nursing under my direct supervision.

COMPLICATIONS:
None immediate

PROCEDURE:
Informed written consent was obtained from the patient after a
discussion of the risks, benefits and alternatives to treatment. The
patient understands and consents the procedure. A timeout was
performed prior to the initiation of the procedure.

Ultrasound scanning was performed of the bilateral flanks. The
inferior pole of the right kidney was selected for biopsy due to
location and sonographic window. The procedure was planned. The
operative site was prepped and draped in the usual sterile fashion.
The overlying soft tissues were anesthetized with 1% lidocaine with
epinephrine. A 17 gauge core needle biopsy device was advanced into
the inferior cortex of the right kidney and 2 core biopsies were
obtained under direct ultrasound guidance. Images were saved for
documentation purposes. The biopsy device was removed and hemostasis
was obtained with manual compression. Post procedural scanning was
negative for significant post procedural hemorrhage or additional
complication. A dressing was placed. The patient tolerated the
procedure well without immediate post procedural complication.
IMPRESSION: Technically successful ultrasound guided right renal biopsy.

## 2020-12-26 MED ORDER — FENTANYL CITRATE (PF) 100 MCG/2ML IJ SOLN
INTRAMUSCULAR | Status: AC | PRN
  Administered 2020-12-26: 50 ug via INTRAVENOUS

## 2020-12-26 MED ORDER — FENTANYL CITRATE (PF) 100 MCG/2ML IJ SOLN
INTRAMUSCULAR | Status: AC
  Filled 2020-12-26: qty 2

## 2020-12-26 MED ORDER — LIDOCAINE HCL (PF) 1 % IJ SOLN
INTRAMUSCULAR | Status: AC | PRN
  Administered 2020-12-26: 10 mL via INTRADERMAL

## 2020-12-26 MED ORDER — MIDAZOLAM HCL 2 MG/2ML IJ SOLN
INTRAMUSCULAR | Status: AC | PRN
  Administered 2020-12-26 (×2): 2 mg via INTRAVENOUS

## 2020-12-26 MED ORDER — MIDAZOLAM HCL 2 MG/2ML IJ SOLN
INTRAMUSCULAR | Status: AC
  Filled 2020-12-26: qty 4

## 2020-12-26 MED ORDER — LIDOCAINE HCL 1 % IJ SOLN
INTRAMUSCULAR | Status: AC
  Filled 2020-12-26: qty 20

## 2020-12-26 NOTE — Progress Notes (Signed)
[facility] Progress Note    Assessment/ Plan:   AKI (nonoliguric): Likely secondary to nivolumab induced nephritis vs ATN/severe sepsis -Baseline Cr is around 0.7-0.9, Cr continues to uptrend -renal u/s 4/12:  No acute abnormalities/obstruction -UA w/ microscopy 4/11: 11-20 WBC's but rare bacteria -No other etiologies that are apparent at the moment. I am suspecting nivolumab induced nephritis. His urine sediment from 3/15 may have been more indicative of this? -Rather difficult situation, do not want to blunt his immune response given that he has responded to nivolumab. Rather than starting at the traditional 1mg /kg dosing for prednisone, will start at a lower dose: 40mg  daily (starting 4/12). Decadron d/c'ed. Prednisone taper plan dependent on biopy results and response to treatment (overall, I am hoping that we can taper him within 4 weeks down to his baseline regimen of decadron 2mg  daily) -Biopsy for definitive diagnosis and to help with future nivolumab infusions (looking for acute tubulointerstitial nephritis with infiltrating neutrophils and lymphocytes on biopsy) -Kidney biopsy today, appreciate assistance from IR -if PO intake is truly poor, then would favor mIVF for him  -Continue to monitor daily Cr, Dose meds for GFR<15 -Monitor Daily I/Os, Daily weight, daily labs  -Maintain MAP>65 for optimal renal perfusion.  -Avoid further nephrotoxins including NSAIDS, Morphine.  Unless absolutely necessary, avoid CT with contrast and/or MRI with gadolinium.     Severe Sepsis secondary to suspected CAP -s/p 5 day course of rocephin and zithromax. Remains afebrile  Metastatic melanoma -multiorgan involvement, receiving nivolumab, s/p cycle 4 on 12/18/20. Onc on board, appreciate recommendations. Case discussed with Dr. 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 Ritalin d/c'ed  Discussed with patient's wife over the phone earlier this morning between 8-9am in regards to plan. Explained the rationale of him needing a biopsy (as above) and briefly explained bleeding risk and his potential inability to cooperate with procedure. She was agreeable with doing the biopsy.  Gean Quint, MD [facility]  Subjective:   No acute events, discussed with [name] (wife) early this AM. Biopsy ordered, planning for biopsy this afternoon.   Objective:   BP 113/72 (BP Location: Right Arm)   Pulse 89   Temp 98.3 F (36.8 C) (Oral)   Resp 18   Ht 6' (1.829 m)   Wt 78.6 kg   SpO2 98%   BMI 23.50 kg/m   Intake/Output Summary (Last 24 hours) at 12/26/2020 1027 Last data filed at 12/26/2020 0500 Gross per 24 hour  Intake --  Output 875 ml  Net -875 ml   Weight change:   Physical Exam: Gen:nad, laying flat in bed CVS:s1s2, rrr Resp:cta bl, no w/r/r/c, unlabored UUV:OZDG, nt/nd Ext:no edema Neuro: awake, alert, following some commands, intermittently restless, moves all ext spontaneously   Imaging: US RENAL  Result Date: 12/26/2020 CLINICAL DATA:  55 year old male with acute renal insufficiency. Metastatic melanoma. EXAM: RENAL / URINARY TRACT ULTRASOUND COMPLETE COMPARISON:  CT Abdomen and Pelvis 09/14/2020. FINDINGS: Right Kidney: Renal measurements: 13.3 x 6.7 x 8.8 cm = volume: 406 mL. Echogenicity within normal limits. No mass or hydronephrosis visualized. Left Kidney: Renal measurements: 13.5 x 7.3 by 6.9 cm = volume: 355 mL. Echogenicity within normal limits. No mass or hydronephrosis visualized. Bladder: Diminutive.  Appears normal for degree of bladder distention. Other: None. IMPRESSION: Normal ultrasound appearance of both kidneys and the urinary  bladder. Electronically Signed   By: Genevie Ann M.D.   On: 12/26/2020 08:17    Labs: BMET Recent Labs  Lab 12/19/20 1133 12/20/20 0438 12/21/20 0421 12/22/20 0910 12/23/20 0451 12/25/20 0502 12/26/20 0741  NA 145 148* 145 146* 145 138 140  K 3.4* 4.1 3.6 3.3* 3.9 3.4* 3.6  CL 108 112* 113* 115* 116* 107 106  CO2 27 26 24 24  20* 23 24  GLUCOSE 131* 117* 104* 110* 91 100* 106*  BUN 13 16 14 16 12 13 19   CREATININE 1.46* 1.53* 1.51* 1.61* 1.50* 2.18* 2.42*  CALCIUM 8.5* 8.4* 8.0* 7.8* 8.0* 8.3* 8.4*  PHOS 3.9 3.4 2.7  --  3.3  --  4.9*   CBC Recent Labs  Lab 12/19/20 1133 12/20/20 0438 12/21/20 0421 12/23/20 0451 12/26/20 0741  WBC 7.8 7.8 8.2 9.0 7.7  NEUTROABS 5.9 6.2  --   --   --   HGB 13.1 13.1 10.7* 10.8* 11.4*  HCT 39.5 40.2 32.9* 32.5* 33.8*  MCV 95.9 97.3 96.2 95.0 94.9  PLT 246 230 205 212 255    Medications:    . citalopram  10 mg Oral QHS  . feeding supplement  237 mL Oral TID BM  . levETIRAcetam  500 mg Oral BID  . multivitamin with minerals  1 tablet Oral Daily  . pantoprazole  40 mg Oral Daily  . predniSONE  40 mg Oral Q breakfast  . senna-docusate  2 tablet Oral QHS      Gean Quint, MD [facility] 12/26/2020, 10:27 AM

## 2020-12-26 NOTE — Progress Notes (Signed)
Patient back fro IR post biopsy, awake but drowsy, biopsy site clean/dry/intact, vital WNL, will continue to assess patient.

## 2020-12-26 NOTE — Progress Notes (Addendum)
IP PROGRESS NOTE  Subjective:   Sean [name] is resting in the bed quietly this morning.  He denies nausea, vomiting.  He offers no other complaints.  Sitter at bedside.  Objective: Vital signs in last 24 hours: Blood pressure 113/72, pulse 89, temperature 98.3 F (36.8 C), temperature source Oral, resp. rate 18, height 6' (1.829 m), weight 78.6 kg, SpO2 98 %.  Intake/Output from previous day: 04/11 0701 - 04/12 0700 In: -  Out: 875 [Urine:875]  Physical Exam:  HEENT: No thrush Abdomen: Nontender, no hepatosplenomegaly Extremities: No leg edema Skin: No rash Neurologic: Alert, follows simple commands, moves extremities to command  Lab Results: Recent Labs    12/26/20 0741  WBC 7.7  HGB 11.4*  HCT 33.8*  PLT 255    BMET Recent Labs    12/25/20 0502 12/26/20 0741  NA 138 140  K 3.4* 3.6  CL 107 106  CO2 23 24  GLUCOSE 100* 106*  BUN 13 19  CREATININE 2.18* 2.42*  CALCIUM 8.3* 8.4*    Lab Results  Component Value Date   CEA1 0.6 09/15/2020    Medications: I have reviewed the patient's current medications.  Assessment/Plan:  1. 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, unknown primary tumor site  CT brain 09/14/2020-multiple hemorrhagic metastases with surrounding edema in the bilateral cerebral hemispheres  CTs chest, abdomen, pelvis 09/14/2020-1.4 cm right upper lobe subpleural nodule, 0.5 cm right lower lobe nodule, multiple hypodensities in the liver suspicious for metastases, 1.5 cm right upper pole hypodense kidney lesion-indeterminate  MRI abdomen 09/15/2020-multifocal T1 hyperintense liver lesions, no kidney mass  Ultrasound-guided biopsy of a segment 4B liver lesion 09/18/2020-benign liver parenchyma with steatosis, no evidence of malignancy  PET 09/25/2020-focal area of hypermetabolism in the right hilum, multiple areas of the liver, pelvic musculature, and left thigh-no CT correlate with any of these areas on the  noncontrast CT, 10 mm anterior right upper lobe nodule with low-level hypermetabolism, additional tiny pulmonary nodules too small to characterize by PET  SRS to 10 brain lesions 09/27/2020  Resection of left occipital and left frontal lesions on 09/29/2020-pathology consistent with malignant melanoma  Cycle 1 ipilimumab/nivolumab 10/16/2020  CT of the brain without contrast 10/26/2020 showed significant interval increase in the number and size of numerous metastases  Brain MRI 10/24/2020-multiple new and increased brain metastases, moderate edema surrounding multiple lesions, new mass-effect at the right lateral ventricle  Brain MRI 11/01/2020-stable appearance of his multiple intracranial metastases and associated edema and mild mass-effect.  CT of the chest with contrast 11/01/2020-interval progression of bilateral pulmonary nodules concerning for metastatic disease, small right hilar lymph nodes, new and progressive ill-defined lesions in the liver compatible with progression of metastatic involvement.  Cycle 2 nivolumab 11/10/2020  Cycle 3 nivolumab 11/27/2020  MRI brain 12/13/2020-decrease in size of hemorrhagic brain metastases, improvement in vasogenic edema and mass-effect, new areas of nonhemorrhagic enhancement by frontal and right parietal white matter-nonspecific  CT chest 12/08/2020-decreased size of lung and liver metastases  Cycle 4 nivolumab 12/18/2020 2.Left-sided weakness, new onset seizure secondary to #1, weakness has resolved 3.Hyperlipidemia 4.Elevated liver enzymes and bilirubin  Liver enzymes persistently elevated 5.Headache and nausea/vomiting secondary to #1, resolved 6. Hospital admission 10/20/2020-encephalopathy, slow Decadron taper-down to 2 mg at discharge 11/09/2020 7.  Admission 12/18/2020 with fever  Sean [name] appears unchanged.  His neurologic status appears at baseline over the past several weeks.  The creatinine and liver enzymes were more elevated  this admission.  This could be  related to toxicity from nivolumab or infection.  Liver enzymes remained stable.  However the liver enzymes were significantly elevated prior to beginning treatment in January.    Nephrology consulted due to worsening creatinine.  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  He was started on prednisone 60 mg daily.  Ultrasound was ordered and currently pending.  He has an incurable malignancy.  There has been improvement of the metastatic melanoma on recent imaging studies.  I support him remaining on a no CODE BLUE status.  He is now afebrile.  1 blood culture from admission returned positive for staph epidermidis, likely contaminant.  Recommendations: 1.  Continue OT/PT, return to the skilled nursing facility at discharge 2.  Continue Keppra.  He will have outpatient follow-up with neuro-oncology. 3.  Nephrology following.  Currently on prednisone 60 mg daily and will recommend decreasing dose as he has been receiving nivolumab.   LOS: 7 days   Mikey Bussing, NP   12/26/2020, 8:31 AM Sean [name] appears unchanged.  The creatinine is more elevated again today.  Dr. [name] feels the renal failure is related to nivolumab nephritis versus ATN.  I have communicated with Dr. [name].  The plan is to start prednisone at a moderate dose with the plan to taper prednisone as quickly as possible.  Dr. [name] recommends proceeding with a renal biopsy.  I was present for greater than 50% of today's visit.  I perform medical decision making.

## 2020-12-26 NOTE — Progress Notes (Signed)
PROGRESS NOTE  Sean [name] YKZ:[id number] DOB: 09-25-1965 DOA: 12/18/2020 PCP: [name], MD   LOS: 7 days   Brief narrative: Sean [name] is a 55 y.o. 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  Patient does have baseline cognitive dysfunction due to metastatic brain disease and is only oriented to self and place.  History was obtained from the patient's family who reported a fever of 101.4 F with increased breathing.  In the ED patient was noted to be slightly tachycardic and had mild hypotension.  Laboratory data showed normal leukocyte count but lactate was elevated at 2.4 which trended up to 2.7.  COVID-19 and influenza test was negative.  Chest x-ray was reported as left lower lobe airspace opacity.  In the ED, CT head was performed which showed stable but extensive hemorrhagic metastasis.  Blood cultures were drawn from the ED.  Patient received 2 L of lactated ringer, Rocephin and Zithromax and was admitted to hospital for further evaluation and treatment.  During hospitalization, patient was noted to have elevated creatinine which started trending up despite IV fluids.  He was more agitated and required one-to-one sitter and Ativan for agitation.  Nephrology was consulted due to concern for nivolumab induced toxicity.  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  Completed a 5-day course of Rocephin and Zithromax.    Agitation, confusion.  Required one-to-one sitter.  Could not use Haldol due to history of seizures.   Continue  Ativan as needed for extreme agitation.  We will continue to hold discontinue ritalin for now.  Could start at a lower dose when started.Had spoken with the patient's spouse who had indicated role of ritalin in agitation  Metastatic melanoma-with extensive mets including brain.   S/p craniotomy and resection of left occipital and left frontal lesion in 09/2020.  CT head shows stable extensive hemorrhagic mets.    Followed by Dr [name], oncology.  Was on dexamethasone.  Changed to prednisone from today as per nephrology and oncology recommendation in view of possible nephritis.  Continue Keppra.  Oncology following.  Acute kidney injury    Baseline creatinine of around 0.6-0.7.  Initially received IV fluids but his creatinine has started trending up.  Bladder scan did not show any residual urine volume.  Nephrology was consulted yesterday  due to possibility of nivolumab induced toxicity.  Pending renal ultrasound.urinalysis shows white cells.  Fractional excretion of sodium of 3.2 which indicates intrinsic renal disease. Nephrology planning for renal biopsy.  Patient has been initiated on prednisone instead of dexamethasone.  Nephrology on board.  Lab Results  Component Value Date   CREATININE 2.42 (H) 12/26/2020   CREATININE 2.18 (H) 12/25/2020   CREATININE 1.50 (H) 12/23/2020    History of seizure due to brain mets Continue Keppra.  Prednisone has been started instead of dexamethasone.  Elevated LFTs.  Likely secondary to metastatic disease. Hepatitis panel was nonreactive.  Mild elevation. Will monitor.   Hypokalemia: Replenished. Potassium of 3.6 today.  Mild hypernatremia.  Improved. Sodium level of 140.  Cognitive impairment likely from brain mets.  Alert, awake and oriented to person only,  Requiring one-to-one sitter and Ativan for agitation.Now calm  Generalized weakness/debility Continue physical therapy, occupational therapy evaluation.  Patient is currently at the  skilled nursing facility.  DVT prophylaxis: SCDs Start: 12/19/20 6712  Code Status: Partial code   Family Communication:  None today. I spoke with the patient's spouse Ms. [name] and updated her about the clinical condition of the patient yesterday.   Status is: Inpatient  Remains inpatient appropriate because:IV treatments appropriate due to intensity of illness or inability to take PO and Inpatient level of care appropriate due to severity of illness  Dispo: The patient is from: SNF              Anticipated d/c is to: SNF, likely in 2 to 3 days.  Nephrology on board for acute kidney injury, possible need for biopsy.              Patient currently is  not medically stable to d/c.   Difficult to place patient No  Consultants:  Medical oncology  Nephrology  Procedures:  None yet, plan for renal biopsy  Anti-infectives:  Marland Kitchen Rocephin and Zithromax 4/4>4/9  Anti-infectives (From admission, onward)   Start     Dose/Rate Route Frequency Ordered Stop   12/19/20 1800  cefTRIAXone (ROCEPHIN) 1 g in sodium chloride 0.9 % 100 mL IVPB        1 g 200 mL/hr over 30 Minutes Intravenous Every 24 hours 12/19/20 0715 12/22/20 2216   12/19/20 1500  azithromycin (ZITHROMAX) 500 mg in sodium chloride 0.9 % 250 mL IVPB        500 mg 250 mL/hr over 60 Minutes Intravenous Every 24 hours 12/19/20 1355 12/23/20 1755   12/18/20 2015  cefTRIAXone (ROCEPHIN) 1 g in sodium chloride 0.9 % 100 mL IVPB        1 g 200 mL/hr over 30 Minutes Intravenous  Once 12/18/20 2011 12/18/20 2125   12/18/20 2015  azithromycin (ZITHROMAX) 500 mg in sodium chloride 0.9 % 250 mL IVPB  Status:  Discontinued        500 mg 250 mL/hr over 60 Minutes Intravenous  Once 12/18/20 2011 12/19/20 1355     Subjective: Today, patient was seen and examined at bedside.  Bedside sitter.  Patient  disoriented.  Denies any interval complaints  Objective: Vitals:   12/25/20 0606 12/25/20 1400  BP: 113/73 113/72  Pulse: 99 89   Resp: 18 18  Temp: 98.8 F (37.1 C) 98.3 F (36.8 C)  SpO2: 97% 98%    Intake/Output Summary (Last 24 hours) at 12/26/2020 0716 Last data filed at 12/26/2020 0500 Gross per 24 hour  Intake --  Output 875 ml  Net -875 ml   Filed Weights   12/18/20 1709 12/18/20 2230 12/25/20 1400  Weight: 77.1 kg 79.3 kg 78.6 kg   Body mass index is 23.5 kg/m.   Physical Exam: General:  Average built, not in obvious distress, oriented to self, cognitive dysfunction.   HENT:   No scleral pallor or icterus noted. Oral mucosa is moist.  Chest:  Clear breath sounds.  Diminished breath sounds bilaterally. No crackles or wheezes.  CVS: S1 &S2 heard. No murmur.  Regular rate and rhythm. Abdomen: Soft, nontender, nondistended.  Bowel sounds are heard.   Extremities: No cyanosis, clubbing or edema.  Peripheral pulses are palpable. Psych: Alert, awake and with cognitive dysfunction, oriented to self oriented to self,  CNS:  No cranial nerve deficits.  Moving all extremities. Skin: Warm and dry.  No rashes noted.  Data Review: I have personally reviewed the following laboratory data and studies,  CBC: Recent Labs  Lab 12/19/20 1133 12/20/20 0438 12/21/20 0421 12/23/20 0451  WBC 7.8 7.8 8.2 9.0  NEUTROABS 5.9 6.2  --   --   HGB 13.1 13.1 10.7* 10.8*  HCT 39.5 40.2 32.9* 32.5*  MCV 95.9 97.3 96.2 95.0  PLT 246 230 205 937   Basic Metabolic Panel: Recent Labs  Lab 12/19/20 1133 12/20/20 0438 12/21/20 0421 12/22/20 0910 12/23/20 0451 12/25/20 0502  NA 145 148* 145 146* 145 138  K 3.4* 4.1 3.6 3.3* 3.9 3.4*  CL 108 112* 113* 115* 116* 107  CO2 27 26 24 24  20* 23  GLUCOSE 131* 117* 104* 110* 91 100*  BUN 13 16 14 16 12 13   CREATININE 1.46* 1.53* 1.51* 1.61* 1.50* 2.18*  CALCIUM 8.5* 8.4* 8.0* 7.8* 8.0* 8.3*  MG 1.8 2.0 1.7  --  1.7  --   PHOS 3.9 3.4 2.7  --  3.3  --    Liver Function Tests: Recent Labs  Lab 12/19/20 1133 12/20/20 0438 12/21/20 0421 12/23/20 0451  AST 135*  120* 59* 46*  ALT 222* 231* 156* 119*  ALKPHOS 138* 139* 119 124  BILITOT 1.8* 1.0 0.6 0.2*  PROT 6.2* 6.2* 5.2* 5.4*  ALBUMIN 3.2* 3.2* 2.4* 2.4*   No results for input(s): LIPASE, AMYLASE in the last 168 hours. Recent Labs  Lab 12/20/20 0438  AMMONIA 10   Cardiac Enzymes: No results for input(s): CKTOTAL, CKMB, CKMBINDEX, TROPONINI in the last 168 hours. BNP (last 3 results) No results for input(s): BNP in the last 8760 hours.  ProBNP (last 3 results) No results for input(s): PROBNP in the last 8760 hours.  CBG: No results for input(s): GLUCAP in the last 168 hours. Recent Results (from the past 240 hour(s))  Blood Culture (routine x 2)     Status: Abnormal   Collection Time: 12/18/20  5:10 PM   Specimen: BLOOD RIGHT FOREARM  Result Value Ref Range Status   Specimen Description   Final    BLOOD RIGHT FOREARM Performed at [facility], [address], [place], [place] [address]    Special Requests   Final    BOTTLES DRAWN AEROBIC ONLY Blood Culture results may not be optimal due to an inadequate volume of blood received in culture bottles Performed at [facility]    Culture  Setup Time   Final    AEROBIC BOTTLE ONLY GRAM POSITIVE COCCI CRITICAL RESULT CALLED TO, READ BACK BY AND VERIFIED WITH: E JACKSON PHARMD 12/20/20 0301 JDW    Culture (A)  Final    STAPHYLOCOCCUS EPIDERMIDIS THE SIGNIFICANCE OF ISOLATING THIS ORGANISM FROM A SINGLE SET OF BLOOD CULTURES WHEN MULTIPLE SETS ARE DRAWN IS UNCERTAIN. PLEASE NOTIFY THE MICROBIOLOGY DEPARTMENT WITHIN ONE WEEK IF SPECIATION AND SENSITIVITIES ARE REQUIRED. Performed at [facility], [place] [address], [place], [place] [address]    Report Status 12/21/2020 FINAL  Final  Blood Culture ID Panel (Reflexed)     Status: Abnormal   Collection Time: 12/18/20  5:10 PM  Result Value Ref Range Status   Enterococcus faecalis NOT DETECTED NOT DETECTED Final   Enterococcus Faecium NOT DETECTED NOT DETECTED  Final   Listeria monocytogenes NOT DETECTED NOT DETECTED Final   Staphylococcus species DETECTED (A) NOT DETECTED Final    Comment: CRITICAL RESULT CALLED TO, READ  BACK BY AND VERIFIED WITH: E JACKSON PHARMD 12/20/20 0301 JDW    Staphylococcus aureus (BCID) NOT DETECTED NOT DETECTED Final   Staphylococcus epidermidis DETECTED (A) NOT DETECTED Final    Comment: CRITICAL RESULT CALLED TO, READ BACK BY AND VERIFIED WITH: E JACKSON PHARMD 12/20/20 0301 JDW    Staphylococcus lugdunensis NOT DETECTED NOT DETECTED Final   Streptococcus species NOT DETECTED NOT DETECTED Final   Streptococcus agalactiae NOT DETECTED NOT DETECTED Final   Streptococcus pneumoniae NOT DETECTED NOT DETECTED Final   Streptococcus pyogenes NOT DETECTED NOT DETECTED Final   A.calcoaceticus-baumannii NOT DETECTED NOT DETECTED Final   Bacteroides fragilis NOT DETECTED NOT DETECTED Final   Enterobacterales NOT DETECTED NOT DETECTED Final   Enterobacter cloacae complex NOT DETECTED NOT DETECTED Final   Escherichia coli NOT DETECTED NOT DETECTED Final   Klebsiella aerogenes NOT DETECTED NOT DETECTED Final   Klebsiella oxytoca NOT DETECTED NOT DETECTED Final   Klebsiella pneumoniae NOT DETECTED NOT DETECTED Final   Proteus species NOT DETECTED NOT DETECTED Final   Salmonella species NOT DETECTED NOT DETECTED Final   Serratia marcescens NOT DETECTED NOT DETECTED Final   Haemophilus influenzae NOT DETECTED NOT DETECTED Final   Neisseria meningitidis NOT DETECTED NOT DETECTED Final   Pseudomonas aeruginosa NOT DETECTED NOT DETECTED Final   Stenotrophomonas maltophilia NOT DETECTED NOT DETECTED Final   Candida albicans NOT DETECTED NOT DETECTED Final   Candida auris NOT DETECTED NOT DETECTED Final   Candida glabrata NOT DETECTED NOT DETECTED Final   Candida krusei NOT DETECTED NOT DETECTED Final   Candida parapsilosis NOT DETECTED NOT DETECTED Final   Candida tropicalis NOT DETECTED NOT DETECTED Final   Cryptococcus  neoformans/gattii NOT DETECTED NOT DETECTED Final   Methicillin resistance mecA/C NOT DETECTED NOT DETECTED Final    Comment: Performed at [facility], [address] [place], [place] [address]  Resp Panel by RT-PCR (Flu A&B, Covid) Nasopharyngeal Swab     Status: None   Collection Time: 12/18/20  5:36 PM   Specimen: Nasopharyngeal Swab; Nasopharyngeal(NP) swabs in vial transport medium  Result Value Ref Range Status   SARS Coronavirus 2 by RT PCR NEGATIVE NEGATIVE Final    Comment: (NOTE) SARS-CoV-2 target nucleic acids are NOT DETECTED.  The SARS-CoV-2 RNA is generally detectable in upper respiratory specimens during the acute phase of infection. The lowest concentration of SARS-CoV-2 viral copies this assay can detect is 138 copies/mL. A negative result does not preclude SARS-Cov-2 infection and should not be used as the sole basis for treatment or other patient management decisions. A negative result may occur with  improper specimen collection/handling, submission of specimen other than nasopharyngeal swab, presence of viral mutation(s) within the areas targeted by this assay, and inadequate number of viral copies(<138 copies/mL). A negative result must be combined with clinical observations, patient history, and epidemiological information. The expected result is Negative.  Fact Sheet for Patients:  EntrepreneurPulse.com.au  Fact Sheet for Healthcare Providers:  IncredibleEmployment.be  This test is no t yet approved or cleared by the [place] FDA and  has been authorized for detection and/or diagnosis of SARS-CoV-2 by FDA under an Emergency Use Authorization (EUA). This EUA will remain  in effect (meaning this test can be used) for the duration of the COVID-19 declaration under Section 564(b)(1) of the Act, 21 U.S.C.section 360bbb-3(b)(1), unless the authorization is terminated  or revoked sooner.       Influenza A by PCR  NEGATIVE NEGATIVE Final   Influenza B  by PCR NEGATIVE NEGATIVE Final    Comment: (NOTE) The Xpert Xpress SARS-CoV-2/FLU/RSV plus assay is intended as an aid in the diagnosis of influenza from Nasopharyngeal swab specimens and should not be used as a sole basis for treatment. Nasal washings and aspirates are unacceptable for Xpert Xpress SARS-CoV-2/FLU/RSV testing.  Fact Sheet for Patients: EntrepreneurPulse.com.au  Fact Sheet for Healthcare Providers: IncredibleEmployment.be  This test is not yet approved or cleared by the [place] FDA and has been authorized for detection and/or diagnosis of SARS-CoV-2 by FDA under an Emergency Use Authorization (EUA). This EUA will remain in effect (meaning this test can be used) for the duration of the COVID-19 declaration under Section 564(b)(1) of the Act, 21 U.S.C. section 360bbb-3(b)(1), unless the authorization is terminated or revoked.  Performed at [facility]   Blood Culture (routine x 2)     Status: None   Collection Time: 12/18/20  5:38 PM   Specimen: BLOOD  Result Value Ref Range Status   Specimen Description BLOOD LEFT ANTECUBITAL  Final   Special Requests   Final    Blood Culture adequate volume BOTTLES DRAWN AEROBIC AND ANAEROBIC   Culture   Final    NO GROWTH 5 DAYS Performed at [facility], [place] [address], [place], [place] [address]    Report Status 12/23/2020 FINAL  Final  Culture, Urine     Status: None   Collection Time: 12/19/20 10:30 AM   Specimen: Urine, Random  Result Value Ref Range Status   Specimen Description   Final    URINE, RANDOM Performed at [place] [address], [place], [place] [address]    Special Requests   Final    NONE Performed at [facility], [place] [address] [place], [place] [address]    Culture   Final    NO GROWTH Performed at [facility], [place] [address]  [place], [place] [address]    Report Status 12/20/2020 FINAL  Final  MRSA PCR Screening     Status: None   Collection Time: 12/24/20 10:07 AM   Specimen: Nasal Mucosa; Nasopharyngeal  Result Value Ref Range Status   MRSA by PCR NEGATIVE NEGATIVE Final    Comment:        The GeneXpert MRSA Assay (FDA approved for NASAL specimens only), is one component of a comprehensive MRSA colonization surveillance program. It is not intended to diagnose MRSA infection nor to guide or monitor treatment for MRSA infections. Performed at [facility], [place] [address], [place], [place] [address]      Studies: No results found.   Flora Lipps, MD  [facility] 12/26/2020  If 7PM-7AM, please contact night-coverage

## 2020-12-26 NOTE — TOC Progression Note (Signed)
Transition of Care [facility]) - Progression Note    Patient Details  Name: Sean [name] MRN: [id number] Date of Birth: [birth year]  [facility]) CM/SW Contact  Kairee Isa, Juliann Pulse, RN Phone Number: 12/26/2020, 11:51 AM  Clinical Narrative: Wandra Feinstein rep Lorenso Courier following-she has started pre  [place]. #[phone]. CM faxed additional clinicals to fax#[phone].Not medically stable for d/c today.      Expected Discharge Plan: Skilled Nursing Facility Barriers to Discharge: Ship broker  Expected Discharge Plan and Services Expected Discharge Plan: [place]   Discharge Planning Services: CM Consult Post Acute Care Choice: Resumption of Svcs/PTA Provider Living arrangements for the past 2 months: Single Family Home,Skilled Nursing Facility                                       Social Determinants of Health (SDOH) Interventions    Readmission Risk Interventions No flowsheet data found.

## 2020-12-26 NOTE — Progress Notes (Signed)
Occupational Therapy Treatment Patient Details Name: Caison Hearn MRN: [id number] DOB: 23-Dec-1965 Today's Date: 12/26/2020    History of present illness Lattie Cervi is a 55 y.o. male.  Presents to ER with concern for fever following cancer treatment on 12/18/20.  Pt admitted for Severe sepsis due to community-acquired pneumonia.  Patient has a history of metastatic melanoma.  Extensive brain mets.   OT comments  Patient semi-supine in bed upon arrival, needs multimodal cues to perform bed mobility as patient tries to lay himself back into the bed. Patient needing min A and hand held support due to poor dynamic balance, patient reaching out for furniture/objects in room. Note patient with near LE scissoring when turning in door way needing min A for safety. Initially patient standing up out of chair, able to redirect and put up leg rest encouraged patient to relax in chair for a while. Chair alarm on and sitting present with patient sitting up in chair watching TV at end of session. Continue with POC   Follow Up Recommendations  SNF    Equipment Recommendations  None recommended by OT       Precautions / Restrictions Precautions Precautions: Fall Precaution Comments: cognitive deficits - doesn't follow verbal commands well Restrictions Weight Bearing Restrictions: No       Mobility Bed Mobility Overal bed mobility: Needs Assistance Bed Mobility: Supine to Sit     Supine to sit: Supervision     General bed mobility comments: needs cues for sequencing as patient tries to lay back down    Transfers Overall transfer level: Needs assistance Equipment used: 1 person hand held assist Transfers: Sit to/from Stand Sit to Stand: Min assist         General transfer comment: needs steadying assistance    Balance Overall balance assessment: Needs assistance Sitting-balance support: Feet supported Sitting balance-Leahy Scale: Fair     Standing balance support: Single  extremity supported Standing balance-Leahy Scale: Poor Standing balance comment: reliant on UE support due to poor dynamic balance                           ADL either performed or assessed with clinical judgement   ADL Overall ADL's : Needs assistance/impaired                         Toilet Transfer: Minimal assistance;Ambulation Toilet Transfer Details (indicate cue type and reason): needing min hand held assistance due to near LE scissoring at times. also needing cues to redirect         Functional mobility during ADLs: Minimal assistance General ADL Comments: patient high fall risk due to poor dyanmic balance and needing cues for redirection/safety               Cognition Arousal/Alertness: Awake/alert Behavior During Therapy: WFL for tasks assessed/performed Overall Cognitive Status: History of cognitive impairments - at baseline                                 General Comments: needs multimodal cues and redirection to tasks                   Pertinent Vitals/ Pain       Pain Assessment: Faces Faces Pain Scale: No hurt         Frequency  Min 2X/week  Progress Toward Goals  OT Goals(current goals can now be found in the care plan section)  Progress towards OT goals: Progressing toward goals  Acute Rehab OT Goals Patient Stated Goal: he needs to go to LTC, OT Goal Formulation: Patient unable to participate in goal setting Time For Goal Achievement: 01/03/21 Potential to Achieve Goals: Fair ADL Goals Pt Will Perform Lower Body Dressing: with min assist;sit to/from stand Pt Will Transfer to Toilet: ambulating;with min assist;regular height toilet;grab bars Pt Will Perform Toileting - Clothing Manipulation and hygiene: with min assist;sit to/from stand Additional ADL Goal #1: Patient will stand at sink to perform grooming task as evidence of improving balance and safety  Plan Discharge plan remains appropriate        AM-PAC OT "6 Clicks" Daily Activity     Outcome Measure   Help from another person eating meals?: A Little Help from another person taking care of personal grooming?: A Little Help from another person toileting, which includes using toliet, bedpan, or urinal?: A Little Help from another person bathing (including washing, rinsing, drying)?: A Little Help from another person to put on and taking off regular upper body clothing?: A Little Help from another person to put on and taking off regular lower body clothing?: A Little 6 Click Score: 18    End of Session Equipment Utilized During Treatment: Gait belt  OT Visit Diagnosis: Other abnormalities of gait and mobility (R26.89);Muscle weakness (generalized) (M62.81);Other symptoms and signs involving cognitive function;Other symptoms and signs involving the nervous system (R29.898)   Activity Tolerance Patient tolerated treatment well   Patient Left in chair;with call bell/phone within reach;with chair alarm set;with nursing/sitter in room   Nurse Communication Mobility status        Time: 1017-1030 OT Time Calculation (min): 13 min  Charges: OT General Charges $OT Visit: 1 Visit OT Treatments $Self Care/Home Management : 8-22 mins  Delbert Phenix OT OT pager: [phone]   Rosemary Holms 12/26/2020, 11:29 AM

## 2020-12-26 NOTE — Progress Notes (Signed)
Patient's BP in the 90s to low 100s, IR MD(McCullough) and Dr. Louanne Belton made aware. Will continue to monitor patient. Patient still drowsy post biopsy, dressing clean/dry/intact.

## 2020-12-26 NOTE — Progress Notes (Signed)
Referring Physician(s): Singh,V  Supervising Physician: Jacqulynn Cadet  Patient Status:  [facility] - In-pt  Chief Complaint:    Subjective: Pt familiar to IR service from liver lesion biopsy on 09/18/20. He has a past medical hx of metastatic malignant melanoma with prior resection of left occipital and left frontal lesions in January of this year, associated left-sided weakness with seizure activity, hyperlipidemia, elevated LFTs.  He has recently been treated for sepsis secondary to CAP but now has elevated creatinine of uncertain etiology that could be due to nivolumab induced nephritis.  Prior blood cultures had grown staph epidermidis.  Latest urine culture negative.  Current labs include WBC 7.7, hemoglobin 11.4, platelets 255k, potassium 3.6, creatinine 2.42.  PT/INR pending.  Patient is afebrile.  BP okay.  Request now received from nephrology for image guided random core renal biopsy for further evaluation. He currently denies fever,HA,CP,dyspnea, cough, abd/back pain, N/V or bleeding.   Past Medical History:  Diagnosis Date  . Brain lesion 09/14/2020  . Cancer ([place])    Malignant melanoma to his brain  . Hyperlipidemia   . Liver lesion 09/14/2020   Past Surgical History:  Procedure Laterality Date  . APPLICATION OF CRANIAL NAVIGATION  09/29/2020   Procedure: APPLICATION OF CRANIAL NAVIGATION;  Surgeon: Judith Part, MD;  Location: [place];  Service: Neurosurgery;;  . Kyla Balzarine Left 09/29/2020   Procedure: Left craniotomy for tumor resection with brainlab;  Surgeon: Judith Part, MD;  Location: [place];  Service: Neurosurgery;  Laterality: Left;  . HERNIA REPAIR     left side inguinal hernia about 18-20 years      Allergies: Patient has no known allergies.  Medications: Prior to Admission medications   Medication Sig Start Date End Date Taking? Authorizing Provider  acetaminophen (TYLENOL) 325 MG tablet Take 2 tablets (650 mg total) by mouth every 6 (six)  hours as needed for mild pain (or Fever >/= 101). 11/08/20  Yes Angiulli, Lavon Paganini, PA-C  citalopram (CELEXA) 10 MG tablet Take 1 tablet (10 mg total) by mouth at bedtime. 11/08/20  Yes Angiulli, Lavon Paganini, PA-C  dexamethasone (DECADRON) 2 MG tablet Take 1 tablet (2 mg total) by mouth daily. 11/08/20  Yes Angiulli, Lavon Paganini, PA-C  HYDROcodone-acetaminophen (NORCO/VICODIN) 5-325 MG tablet Take 1 tablet by mouth every 6 (six) hours as needed for moderate pain. 11/27/20  Yes [name], MD  levETIRAcetam (KEPPRA) 500 MG tablet Take 1 tablet (500 mg total) by mouth 2 (two) times daily. 11/08/20  Yes Angiulli, Lavon Paganini, PA-C  melatonin 5 MG TABS Take 1 tablet (5 mg total) by mouth at bedtime as needed. 11/08/20  Yes Angiulli, Lavon Paganini, PA-C  methylphenidate (RITALIN) 5 MG tablet Take 1 tablet (5 mg total) by mouth 2 (two) times daily. 11/14/20  Yes [name], MD  Multiple Vitamin (MULTIVITAMIN WITH MINERALS) TABS tablet Take 1 tablet by mouth daily. Patient taking differently: Take 2 tablets by mouth daily. gummy 09/19/20  Yes Vann, Jessica U, DO  pantoprazole (PROTONIX) 40 MG tablet Take 1 tablet (40 mg total) by mouth daily. 11/08/20  Yes Angiulli, Lavon Paganini, PA-C  senna-docusate (SENOKOT-S) 8.6-50 MG tablet Take 2 tablets by mouth at bedtime. 11/08/20  Yes AngiulliLavon Paganini, PA-C     Vital Signs: BP 113/72 (BP Location: Right Arm)   Pulse 89   Temp 98.3 F (36.8 C) (Oral)   Resp 18   Ht 6' (1.829 m)   Wt 173 lb 4.5 oz (78.6  kg)   SpO2 98%   BMI 23.50 kg/m   Physical Exam awake but has cognitive dysfunction, sitter in room; chest- sl dim BS left base, right clear; heart- RRR; abd- soft,+BS,NT; no LE edema  Imaging: US RENAL  Result Date: 12/26/2020 CLINICAL DATA:  55 year old male with acute renal insufficiency. Metastatic melanoma. EXAM: RENAL / URINARY TRACT ULTRASOUND COMPLETE COMPARISON:  CT Abdomen and Pelvis 09/14/2020. FINDINGS: Right Kidney: Renal measurements: 13.3 x 6.7 x 8.8 cm  = volume: 406 mL. Echogenicity within normal limits. No mass or hydronephrosis visualized. Left Kidney: Renal measurements: 13.5 x 7.3 by 6.9 cm = volume: 355 mL. Echogenicity within normal limits. No mass or hydronephrosis visualized. Bladder: Diminutive.  Appears normal for degree of bladder distention. Other: None. IMPRESSION: Normal ultrasound appearance of both kidneys and the urinary bladder. Electronically Signed   By: Genevie Ann M.D.   On: 12/26/2020 08:17    Labs:  CBC: Recent Labs    12/20/20 0438 12/21/20 0421 12/23/20 0451 12/26/20 0741  WBC 7.8 8.2 9.0 7.7  HGB 13.1 10.7* 10.8* 11.4*  HCT 40.2 32.9* 32.5* 33.8*  PLT 230 205 212 255    COAGS: Recent Labs    09/14/20 2144 09/29/20 1004 12/20/20 0438  INR 1.1 0.9 1.2  APTT 30 25 33    BMP: Recent Labs    12/22/20 0910 12/23/20 0451 12/25/20 0502 12/26/20 0741  NA 146* 145 138 140  K 3.3* 3.9 3.4* 3.6  CL 115* 116* 107 106  CO2 24 20* 23 24  GLUCOSE 110* 91 100* 106*  BUN 16 12 13 19   CALCIUM 7.8* 8.0* 8.3* 8.4*  CREATININE 1.61* 1.50* 2.18* 2.42*  GFRNONAA 50* 54* 35* 31*    LIVER FUNCTION TESTS: Recent Labs    12/20/20 0438 12/21/20 0421 12/23/20 0451 12/26/20 0741  BILITOT 1.0 0.6 0.2* 0.7  AST 120* 59* 46* 50*  ALT 231* 156* 119* 110*  ALKPHOS 139* 119 124 115  PROT 6.2* 5.2* 5.4* 5.8*  ALBUMIN 3.2* 2.4* 2.4* 2.8*    Assessment and Plan: Pt familiar to IR service from liver lesion biopsy on 09/18/20. He has a past medical hx of metastatic malignant melanoma with prior resection of left occipital and left frontal lesions in January of this year, associated left-sided weakness with seizure activity, hyperlipidemia, elevated LFTs.  He has recently been treated for sepsis secondary to CAP but now has elevated creatinine of uncertain etiology that could be due to nivolumab induced nephritis.  Prior blood cultures had grown staph epidermidis.  Latest urine culture negative.  Current labs include WBC  7.7, hemoglobin 11.4, platelets 255k, potassium 3.6, creatinine 2.42.  PT/INR pending.  Patient is afebrile.  BP okay.  Request now received from nephrology for image guided random core renal biopsy for further evaluation. Risks and benefits of procedure was discussed with the patient/spouse including, but not limited to bleeding, infection, damage to adjacent structures or low yield requiring additional tests.  All of the questions were answered and there is agreement to proceed.  Consent signed and in chart.  Procedure scheduled for today around noon   Electronically Signed: D. Rowe Robert, PA-C 12/26/2020, 10:11 AM   I spent a total of 25 minutes at the the patient's bedside AND on the patient's hospital floor or unit, greater than 50% of which was counseling/coordinating care for image guided random renal biopsy    Patient ID: Sean [name], male   DOB: Jun 14, 1966, 54 y.o.   MRN: [id number]

## 2020-12-26 NOTE — Procedures (Signed)
Interventional Radiology Procedure Note  Procedure: US guided random renal biopsy, RIGHT  Complications: None  Estimated Blood Loss: None  Recommendations: - Bedrest x 4 hrs   Signed,  Criselda Peaches, MD

## 2020-12-27 DIAGNOSIS — C7931 Secondary malignant neoplasm of brain: Secondary | ICD-10-CM | POA: Diagnosis not present

## 2020-12-27 DIAGNOSIS — R7989 Other specified abnormal findings of blood chemistry: Secondary | ICD-10-CM | POA: Diagnosis not present

## 2020-12-27 DIAGNOSIS — A419 Sepsis, unspecified organism: Secondary | ICD-10-CM | POA: Diagnosis not present

## 2020-12-27 DIAGNOSIS — J189 Pneumonia, unspecified organism: Secondary | ICD-10-CM | POA: Diagnosis not present

## 2020-12-27 LAB — CBC
HCT: 35.3 % — ABNORMAL LOW (ref 39.0–52.0)
Hemoglobin: 11.6 g/dL — ABNORMAL LOW (ref 13.0–17.0)
MCH: 31.2 pg (ref 26.0–34.0)
MCHC: 32.9 g/dL (ref 30.0–36.0)
MCV: 94.9 fL (ref 80.0–100.0)
Platelets: 325 10*3/uL (ref 150–400)
RBC: 3.72 MIL/uL — ABNORMAL LOW (ref 4.22–5.81)
RDW: 13.7 % (ref 11.5–15.5)
WBC: 9.2 10*3/uL (ref 4.0–10.5)
nRBC: 0 % (ref 0.0–0.2)

## 2020-12-27 LAB — BASIC METABOLIC PANEL
Anion gap: 12 (ref 5–15)
BUN: 26 mg/dL — ABNORMAL HIGH (ref 6–20)
CO2: 25 mmol/L (ref 22–32)
Calcium: 8.7 mg/dL — ABNORMAL LOW (ref 8.9–10.3)
Chloride: 104 mmol/L (ref 98–111)
Creatinine, Ser: 2.66 mg/dL — ABNORMAL HIGH (ref 0.61–1.24)
GFR, Estimated: 27 mL/min — ABNORMAL LOW (ref >60)
Glucose, Bld: 110 mg/dL — ABNORMAL HIGH (ref 70–99)
Potassium: 3.9 mmol/L (ref 3.5–5.1)
Sodium: 141 mmol/L (ref 135–145)

## 2020-12-27 LAB — PHOSPHORUS: Phosphorus: 4 mg/dL (ref 2.5–4.6)

## 2020-12-27 LAB — MAGNESIUM: Magnesium: 2 mg/dL (ref 1.7–2.4)

## 2020-12-27 MED ORDER — LORAZEPAM 2 MG/ML IJ SOLN: 1.0000 mg | Freq: Four times a day (QID) | INTRAMUSCULAR | Status: DC | PRN

## 2020-12-27 MED ORDER — FAMOTIDINE 20 MG PO TABS
20.0000 mg | ORAL_TABLET | Freq: Every day | ORAL | Status: DC
  Administered 2020-12-28 – 2021-01-03 (×7): 20 mg via ORAL
  Filled 2020-12-27 (×7): qty 1

## 2020-12-27 NOTE — Progress Notes (Signed)
Sean [name] Progress Note    Assessment/ Plan:   AKI (nonoliguric): Likely secondary to nivolumab induced nephritis vs ATN/severe sepsis -Baseline Cr is around 0.7-0.9, Cr continues to uptrend -renal u/s 4/12:  No acute abnormalities/obstruction -UA w/ microscopy 4/11: 11-20 WBC's but rare bacteria -No other etiologies that are apparent at the moment. I am suspecting nivolumab induced nephritis. His urine sediment from 3/15 may have been more indicative of this? -Rather difficult situation, do not want to blunt his immune response given that he has responded to nivolumab. Rather than starting at the traditional 1mg /kg dosing for prednisone, started at a lower dose: 40mg  daily (started 4/12). Decadron d/c'ed. Prednisone taper plan dependent on biopy results and response to treatment (overall, I am hoping that we can taper him within 4 weeks down to his baseline regimen of decadron 2mg  daily) -Biopsy for definitive diagnosis and to help with future nivolumab infusions (looking for acute tubulointerstitial nephritis with infiltrating neutrophils and lymphocytes on biopsy) -s/p kidney biopsy (uncomplicated) 1/61, pending results -if PO intake is truly poor, then would favor mIVF for him but does not seem that he needs any IVF at this moment -Continue to monitor daily Cr, Dose meds for GFR<15 -Monitor Daily I/Os, Daily weight, daily labs  -Maintain MAP>65 for optimal renal perfusion.  -Avoid further nephrotoxins including NSAIDS, Morphine.  Unless absolutely necessary, avoid CT with contrast and/or MRI with gadolinium.     Severe Sepsis secondary to suspected CAP -s/p 5 day course of rocephin and zithromax. Remains afebrile  Relative hypotension -post biopsy, likely secondary to sedation, BP better 128/73 this am.  Metastatic melanoma -multiorgan involvement, receiving nivolumab, s/p cycle 4 on 12/18/20. Onc on board, appreciate recommendations. Case discussed with Dr. 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 Ritalin d/c'ed  Sean Quint, MD [name]  Subjective:   No acute events. S/p kidney biopsy yesterday with IR. Had some issues with hypotension with SBP in the 90's post biopsy but improved. Seems to be less restless today. Per 1:1 no nausea.vomiting today. Sean [name] reports that he feels like he is eating and drinking more, no complaints.   Objective:   BP 128/73   Pulse 76   Temp 97.7 F (36.5 C)   Resp 18   Ht 6' (1.829 m)   Wt 78.6 kg   SpO2 100%   BMI 23.50 kg/m   Intake/Output Summary (Last 24 hours) at 12/27/2020 1140 Last data filed at 12/27/2020 0946 Gross per 24 hour  Intake --  Output 1127 ml  Net -1127 ml   Weight change:   Physical Exam: Gen:nad, laying flat in bed HEENT: MMM CVS:s1s2, rrr Resp:cta bl, no w/r/r/c, unlabored WRU:EAVW, nt/nd Ext:no edema Neuro: awake, alert, following commands, speech clear and coherent  Imaging: US RENAL  Result Date: 12/26/2020 CLINICAL DATA:  55 year old male with acute renal insufficiency. Metastatic melanoma. EXAM: RENAL / URINARY TRACT ULTRASOUND COMPLETE COMPARISON:  CT Abdomen and Pelvis 09/14/2020. FINDINGS: Right Kidney: Renal measurements: 13.3 x 6.7 x 8.8 cm = volume: 406 mL. Echogenicity within normal limits. No mass or hydronephrosis visualized. Left Kidney: Renal measurements: 13.5 x 7.3 by 6.9 cm = volume: 355 mL. Echogenicity within normal limits. No mass or hydronephrosis visualized. Bladder: Diminutive.  Appears normal for degree of bladder distention. Other: None. IMPRESSION: Normal ultrasound appearance of both kidneys and the urinary bladder. Electronically Signed    By: Genevie Ann M.D.   On: 12/26/2020 08:17   US BIOPSY (KIDNEY)  Result Date: 12/26/2020 INDICATION: 55 year old male with a history of melanoma metastatic to the brain and now acute kidney injury. Concern for drug related nephritis. He presents for random renal biopsy. EXAM: ULTRASOUND GUIDED RENAL BIOPSY COMPARISON:  None. MEDICATIONS: Fentanyl 50 mcg IV; Versed 4 mg IV ANESTHESIA/SEDATION: Total Moderate Sedation time 10 minutes The patient's vital signs and level of consciousness were monitored continuously by radiology nursing under my direct supervision. COMPLICATIONS: None immediate PROCEDURE: Informed written consent was obtained from the patient after a discussion of the risks, benefits and alternatives to treatment. The patient understands and consents the procedure. A timeout was performed prior to the initiation of the procedure. Ultrasound scanning was performed of the bilateral flanks. The inferior pole of the right kidney was selected for biopsy due to location and sonographic window. The procedure was planned. The operative site was prepped and draped in the usual sterile fashion. The overlying soft tissues were anesthetized with 1% lidocaine with epinephrine. A 17 gauge core needle biopsy device was advanced into the inferior cortex of the right kidney and 2 core biopsies were obtained under direct ultrasound guidance. Images were saved for documentation purposes. The biopsy device was removed and hemostasis was obtained with manual compression. Post procedural scanning was negative for significant post procedural hemorrhage or additional complication. A dressing was placed. The patient tolerated the procedure well without immediate post procedural complication. IMPRESSION: Technically successful ultrasound guided right renal biopsy. Electronically Signed   By: Jacqulynn Cadet M.D.   On: 12/26/2020 14:14    Labs: BMET Recent Labs  Lab 12/21/20 0421 12/22/20 0910 12/23/20 0451  12/25/20 0502 12/26/20 0741 12/27/20 0452  NA 145 146* 145 138 140 141  K 3.6 3.3* 3.9 3.4* 3.6 3.9  CL 113* 115* 116* 107 106 104  CO2 24 24 20* 23 24 25   GLUCOSE 104* 110* 91 100* 106* 110*  BUN 14 16 12 13 19  26*  CREATININE 1.51* 1.61* 1.50* 2.18* 2.42* 2.66*  CALCIUM 8.0* 7.8* 8.0* 8.3* 8.4* 8.7*  PHOS 2.7  --  3.3  --  4.9* 4.0   CBC Recent Labs  Lab 12/21/20 0421 12/23/20 0451 12/26/20 0741 12/27/20 0452  WBC 8.2 9.0 7.7 9.2  HGB 10.7* 10.8* 11.4* 11.6*  HCT 32.9* 32.5* 33.8* 35.3*  MCV 96.2 95.0 94.9 94.9  PLT 205 212 255 325    Medications:    . citalopram  10 mg Oral QHS  . feeding supplement  237 mL Oral TID BM  . levETIRAcetam  500 mg Oral BID  . multivitamin with minerals  1 tablet Oral Daily  . pantoprazole  40 mg Oral Daily  . predniSONE  40 mg Oral Q breakfast  . senna-docusate  2 tablet Oral QHS      Sean Quint, MD [name] 12/27/2020, 11:40 AM

## 2020-12-27 NOTE — Progress Notes (Signed)
PROGRESS NOTE  Sean [name] GGY:[id number] DOB: 06-30-1966 DOA: 12/18/2020 PCP: [name], MD   LOS: 8 days   Brief narrative: Sean [name] is a 55 y.o. 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  Patient does have baseline cognitive dysfunction due to metastatic brain disease and is only oriented to self and place.  History was obtained from the patient's family who reported a fever of 101.4 F with increased breathing.  In the ED patient was noted to be slightly tachycardic and had mild hypotension.  Laboratory data showed normal leukocyte count but lactate was elevated at 2.4 which trended up to 2.7.  COVID-19 and influenza test was negative.  Chest x-ray was reported as left lower lobe airspace opacity.  In the ED, CT head was performed which showed stable but extensive hemorrhagic metastasis.  Blood cultures were drawn from the ED.  Patient received 2 L of lactated ringer, Rocephin and Zithromax and was admitted to hospital for further evaluation and treatment.  During hospitalization, patient was noted to have elevated creatinine which started trending up despite IV fluids.  He was more agitated and required one-to-one sitter and Ativan for agitation.  Nephrology was consulted due to concern for nivolumab induced toxicity.  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  Completed a 5-day course of Rocephin and Zithromax.    Agitation, confusion.  Required one-to-one sitter.  Could not use Haldol due to history of seizures.   Continue  Ativan as needed for extreme agitation.  We will continue to hold discontinue ritalin for now.  Could start at a lower dose when started.   Metastatic melanoma-with extensive mets including brain.   S/p craniotomy and resection of left occipital and left frontal lesion in 09/2020.  CT head shows stable extensive hemorrhagic mets.    Followed by Dr [name], oncology.  Was on dexamethasone.  Has been changed to prednisone  as per nephrology and oncology recommendation in view of possible nephritis.  Continue Keppra.  Oncology following.  Acute kidney injury    Baseline creatinine of around 0.6-0.7.  Initially received IV fluids but his creatinine started trending up.  Continues to trend up.  Bladder scan did not show any residual urine volume.  Nephrology was consulted  due to possibility of nivolumab induced toxicity.  Renal ultrasound was negative for acute findings.  Biopsy was performed by interventional radiology on 12/26/2020 biopsy pending. Urinalysis shows white cells.  Fractional excretion of sodium of 3.2 which indicates intrinsic renal disease.  Continue prednisone  Lab Results  Component Value Date   CREATININE 2.66 (H) 12/27/2020   CREATININE 2.42 (H) 12/26/2020   CREATININE 2.18 (H) 12/25/2020    History of seizure due to brain mets Continue Keppra.  Prednisone has been started instead of dexamethasone.  Elevated LFTs.  Likely secondary to metastatic disease. Hepatitis panel was nonreactive.  Mild elevation. Will monitor.   Hypokalemia: Replenished. Potassium of 3.9 today. Mild hypernatremia.  Improved. Sodium level of 141.  Cognitive impairment likely from brain mets.   Alert, awake and oriented to person only,  Requiring one-to-one sitter and Ativan for agitation.gets agitated intermittently.  Generalized weakness/debility Continue physical therapy, occupational therapy evaluation.  Patient is currently at the skilled nursing facility.  DVT prophylaxis: SCDs Start:  12/19/20 6440  Code Status: Partial code   Family Communication:  None today.   Status is: Inpatient  Remains inpatient appropriate because:IV treatments appropriate due to intensity of illness or inability to take PO and Inpatient level of care appropriate due to severity of illness  Dispo: The patient is from: SNF              Anticipated d/c is to: SNF, likely in 2 to 3 days.  Nephrology on board for acute kidney injury, possible need for biopsy.              Patient currently is  not medically stable to d/c.   Difficult to place patient No  Consultants:  Medical oncology  Nephrology  Procedures:  renal biopsy on 12/26/2020  Anti-infectives:  Marland Kitchen Rocephin and Zithromax 4/4>4/9  Subjective: Today, patient was seen and examined at bedside.  Patient is disoriented, denies any symptoms but intermittently gets agitated as per the nursing staff.  Objective: Vitals:   12/27/20 0426 12/27/20 1355  BP: 128/73 118/74  Pulse: 76 78  Resp: 18 16  Temp: 97.7 F (36.5 C) (!) 97.4 F (36.3 C)  SpO2: 100% 96%    Intake/Output Summary (Last 24 hours) at 12/27/2020 1358 Last data filed at 12/27/2020 0946 Gross per 24 hour  Intake --  Output 1127 ml  Net -1127 ml   Filed Weights   12/18/20 1709 12/18/20 2230 12/25/20 1400  Weight: 77.1 kg 79.3 kg 78.6 kg   Body mass index is 23.5 kg/m.   Physical Exam:  General:  Average built, not in obvious distress, oriented to self, has baseline cognitive dysfunction.   HENT:   No scleral pallor or icterus noted. Oral mucosa is moist.  Chest:  Clear breath sounds.  Diminished breath sounds bilaterally. No crackles or wheezes.  CVS: S1 &S2 heard. No murmur.  Regular rate and rhythm. Abdomen: Soft, nontender, nondistended.  Bowel sounds are heard.   Extremities: No cyanosis, clubbing or edema.  Peripheral pulses are palpable. Psych: Alert, awake and with cognitive dysfunction, oriented to self only, intermittently gets agitated  CNS:  Moving all extremities. Skin: Warm and dry.  No rashes noted.  Data Review: I have personally reviewed the following laboratory data and studies,  CBC: Recent Labs  Lab 12/21/20 0421 12/23/20 0451 12/26/20 0741 12/27/20 0452  WBC 8.2 9.0 7.7 9.2  HGB 10.7* 10.8* 11.4* 11.6*  HCT 32.9* 32.5* 33.8* 35.3*  MCV 96.2 95.0 94.9 94.9  PLT 205 212 255 347   Basic Metabolic Panel: Recent Labs  Lab 12/21/20 0421 12/22/20 0910 12/23/20 0451 12/25/20 0502 12/26/20 0741 12/27/20 0452  NA 145 146* 145 138 140 141  K 3.6 3.3* 3.9 3.4* 3.6 3.9  CL 113* 115* 116* 107 106 104  CO2 24 24 20* 23 24 25   GLUCOSE 104* 110* 91 100* 106* 110*  BUN 14 16 12 13 19  26*  CREATININE 1.51* 1.61* 1.50* 2.18* 2.42* 2.66*  CALCIUM 8.0* 7.8* 8.0* 8.3* 8.4* 8.7*  MG 1.7  --  1.7  --   --  2.0  PHOS 2.7  --  3.3  --  4.9* 4.0   Liver Function Tests: Recent Labs  Lab 12/21/20 0421 12/23/20 0451 12/26/20 0741  AST 59* 46* 50*  ALT 156* 119*  110*  ALKPHOS 119 124 115  BILITOT 0.6 0.2* 0.7  PROT 5.2* 5.4* 5.8*  ALBUMIN 2.4* 2.4* 2.8*   No results for input(s): LIPASE, AMYLASE in the last 168 hours. No results for input(s): AMMONIA in the last 168 hours. Cardiac Enzymes: No results for input(s): CKTOTAL, CKMB, CKMBINDEX, TROPONINI in the last 168 hours. BNP (last 3 results) No results for input(s): BNP in the last 8760 hours.  ProBNP (last 3 results) No results for input(s): PROBNP in the last 8760 hours.  CBG: No results for input(s): GLUCAP in the last 168 hours. Recent Results (from the past 240 hour(s))  Blood Culture (routine x 2)     Status: Abnormal   Collection Time: 12/18/20  5:10 PM   Specimen: BLOOD RIGHT FOREARM  Result Value Ref Range Status   Specimen Description   Final    BLOOD RIGHT FOREARM Performed at [facility], [address] [place], [place] [address]    Special Requests   Final    BOTTLES DRAWN AEROBIC ONLY Blood Culture results may not be optimal due to  an inadequate volume of blood received in culture bottles Performed at [facility]    Culture  Setup Time   Final    AEROBIC BOTTLE ONLY GRAM POSITIVE COCCI CRITICAL RESULT CALLED TO, READ BACK BY AND VERIFIED WITH: E JACKSON PHARMD 12/20/20 0301 JDW    Culture (A)  Final    STAPHYLOCOCCUS EPIDERMIDIS THE SIGNIFICANCE OF ISOLATING THIS ORGANISM FROM A SINGLE SET OF BLOOD CULTURES WHEN MULTIPLE SETS ARE DRAWN IS UNCERTAIN. PLEASE NOTIFY THE MICROBIOLOGY DEPARTMENT WITHIN ONE WEEK IF SPECIATION AND SENSITIVITIES ARE REQUIRED. Performed at [facility], [place] [address] [place], [place] [address]    Report Status 12/21/2020 FINAL  Final  Blood Culture ID Panel (Reflexed)     Status: Abnormal   Collection Time: 12/18/20  5:10 PM  Result Value Ref Range Status   Enterococcus faecalis NOT DETECTED NOT DETECTED Final   Enterococcus Faecium NOT DETECTED NOT DETECTED Final   Listeria monocytogenes NOT DETECTED NOT DETECTED Final   Staphylococcus species DETECTED (A) NOT DETECTED Final    Comment: CRITICAL RESULT CALLED TO, READ BACK BY AND VERIFIED WITH: E JACKSON PHARMD 12/20/20 0301 JDW    Staphylococcus aureus (BCID) NOT DETECTED NOT DETECTED Final   Staphylococcus epidermidis DETECTED (A) NOT DETECTED Final    Comment: CRITICAL RESULT CALLED TO, READ BACK BY AND VERIFIED WITH: E JACKSON PHARMD 12/20/20 0301 JDW    Staphylococcus lugdunensis NOT DETECTED NOT DETECTED Final   Streptococcus species NOT DETECTED NOT DETECTED Final   Streptococcus agalactiae NOT DETECTED NOT DETECTED Final   Streptococcus pneumoniae NOT DETECTED NOT DETECTED Final   Streptococcus pyogenes NOT DETECTED NOT DETECTED Final   A.calcoaceticus-baumannii NOT DETECTED NOT DETECTED Final   Bacteroides fragilis NOT DETECTED NOT DETECTED Final   Enterobacterales NOT DETECTED NOT DETECTED Final   Enterobacter cloacae complex NOT DETECTED NOT DETECTED Final   Escherichia coli NOT DETECTED NOT DETECTED  Final   Klebsiella aerogenes NOT DETECTED NOT DETECTED Final   Klebsiella oxytoca NOT DETECTED NOT DETECTED Final   Klebsiella pneumoniae NOT DETECTED NOT DETECTED Final   Proteus species NOT DETECTED NOT DETECTED Final   Salmonella species NOT DETECTED NOT DETECTED Final   Serratia marcescens NOT DETECTED NOT DETECTED Final   Haemophilus influenzae NOT DETECTED NOT DETECTED Final   Neisseria meningitidis NOT DETECTED NOT DETECTED Final   Pseudomonas aeruginosa NOT DETECTED NOT  DETECTED Final   Stenotrophomonas maltophilia NOT DETECTED NOT DETECTED Final   Candida albicans NOT DETECTED NOT DETECTED Final   Candida auris NOT DETECTED NOT DETECTED Final   Candida glabrata NOT DETECTED NOT DETECTED Final   Candida krusei NOT DETECTED NOT DETECTED Final   Candida parapsilosis NOT DETECTED NOT DETECTED Final   Candida tropicalis NOT DETECTED NOT DETECTED Final   Cryptococcus neoformans/gattii NOT DETECTED NOT DETECTED Final   Methicillin resistance mecA/C NOT DETECTED NOT DETECTED Final    Comment: Performed at [facility], [place] [address], [place], [place] [address]  Resp Panel by RT-PCR (Flu A&B, Covid) Nasopharyngeal Swab     Status: None   Collection Time: 12/18/20  5:36 PM   Specimen: Nasopharyngeal Swab; Nasopharyngeal(NP) swabs in vial transport medium  Result Value Ref Range Status   SARS Coronavirus 2 by RT PCR NEGATIVE NEGATIVE Final    Comment: (NOTE) SARS-CoV-2 target nucleic acids are NOT DETECTED.  The SARS-CoV-2 RNA is generally detectable in upper respiratory specimens during the acute phase of infection. The lowest concentration of SARS-CoV-2 viral copies this assay can detect is 138 copies/mL. A negative result does not preclude SARS-Cov-2 infection and should not be used as the sole basis for treatment or other patient management decisions. A negative result may occur with  improper specimen collection/handling, submission of specimen other than nasopharyngeal  swab, presence of viral mutation(s) within the areas targeted by this assay, and inadequate number of viral copies(<138 copies/mL). A negative result must be combined with clinical observations, patient history, and epidemiological information. The expected result is Negative.  Fact Sheet for Patients:  EntrepreneurPulse.com.au  Fact Sheet for Healthcare Providers:  IncredibleEmployment.be  This test is no t yet approved or cleared by the [place] FDA and  has been authorized for detection and/or diagnosis of SARS-CoV-2 by FDA under an Emergency Use Authorization (EUA). This EUA will remain  in effect (meaning this test can be used) for the duration of the COVID-19 declaration under Section 564(b)(1) of the Act, 21 U.S.C.section 360bbb-3(b)(1), unless the authorization is terminated  or revoked sooner.       Influenza A by PCR NEGATIVE NEGATIVE Final   Influenza B by PCR NEGATIVE NEGATIVE Final    Comment: (NOTE) The Xpert Xpress SARS-CoV-2/FLU/RSV plus assay is intended as an aid in the diagnosis of influenza from Nasopharyngeal swab specimens and should not be used as a sole basis for treatment. Nasal washings and aspirates are unacceptable for Xpert Xpress SARS-CoV-2/FLU/RSV testing.  Fact Sheet for Patients: EntrepreneurPulse.com.au  Fact Sheet for Healthcare Providers: IncredibleEmployment.be  This test is not yet approved or cleared by the [place] FDA and has been authorized for detection and/or diagnosis of SARS-CoV-2 by FDA under an Emergency Use Authorization (EUA). This EUA will remain in effect (meaning this test can be used) for the duration of the COVID-19 declaration under Section 564(b)(1) of the Act, 21 U.S.C. section 360bbb-3(b)(1), unless the authorization is terminated or revoked.  Performed at [facility]   Blood Culture (routine x 2)     Status: None    Collection Time: 12/18/20  5:38 PM   Specimen: BLOOD  Result Value Ref Range Status   Specimen Description BLOOD LEFT ANTECUBITAL  Final   Special Requests   Final    Blood Culture adequate volume BOTTLES DRAWN AEROBIC AND ANAEROBIC   Culture   Final    NO GROWTH 5 DAYS Performed at [facility], [place]  [address], [place], [place] [address]    Report Status 12/23/2020 FINAL  Final  Culture, Urine     Status: None   Collection Time: 12/19/20 10:30 AM   Specimen: Urine, Random  Result Value Ref Range Status   Specimen Description   Final    URINE, RANDOM Performed at [facility] [address], [place], [place] [address]    Special Requests   Final    NONE Performed at [facility], [place] [address], [place], [place] [address]    Culture   Final    NO GROWTH Performed at [facility], [place] [address], [place], [place] [address]    Report Status 12/20/2020 FINAL  Final  MRSA PCR Screening     Status: None   Collection Time: 12/24/20 10:07 AM   Specimen: Nasal Mucosa; Nasopharyngeal  Result Value Ref Range Status   MRSA by PCR NEGATIVE NEGATIVE Final    Comment:        The GeneXpert MRSA Assay (FDA approved for NASAL specimens only), is one component of a comprehensive MRSA colonization surveillance program. It is not intended to diagnose MRSA infection nor to guide or monitor treatment for MRSA infections. Performed at [facility], [place] [address], [place], [place] [address]      Studies: US RENAL  Result Date: 12/26/2020 CLINICAL DATA:  55 year old male with acute renal insufficiency. Metastatic melanoma. EXAM: RENAL / URINARY TRACT ULTRASOUND COMPLETE COMPARISON:  CT Abdomen and Pelvis 09/14/2020. FINDINGS: Right Kidney: Renal measurements: 13.3 x 6.7 x 8.8 cm = volume: 406 mL. Echogenicity within normal limits. No mass or hydronephrosis visualized. Left Kidney: Renal measurements: 13.5 x 7.3 by 6.9  cm = volume: 355 mL. Echogenicity within normal limits. No mass or hydronephrosis visualized. Bladder: Diminutive.  Appears normal for degree of bladder distention. Other: None. IMPRESSION: Normal ultrasound appearance of both kidneys and the urinary bladder. Electronically Signed   By: Genevie Ann M.D.   On: 12/26/2020 08:17   US BIOPSY (KIDNEY)  Result Date: 12/26/2020 INDICATION: 55 year old male with a history of melanoma metastatic to the brain and now acute kidney injury. Concern for drug related nephritis. He presents for random renal biopsy. EXAM: ULTRASOUND GUIDED RENAL BIOPSY COMPARISON:  None. MEDICATIONS: Fentanyl 50 mcg IV; Versed 4 mg IV ANESTHESIA/SEDATION: Total Moderate Sedation time 10 minutes The patient's vital signs and level of consciousness were monitored continuously by radiology nursing under my direct supervision. COMPLICATIONS: None immediate PROCEDURE: Informed written consent was obtained from the patient after a discussion of the risks, benefits and alternatives to treatment. The patient understands and consents the procedure. A timeout was performed prior to the initiation of the procedure. Ultrasound scanning was performed of the bilateral flanks. The inferior pole of the right kidney was selected for biopsy due to location and sonographic window. The procedure was planned. The operative site was prepped and draped in the usual sterile fashion. The overlying soft tissues were anesthetized with 1% lidocaine with epinephrine. A 17 gauge core needle biopsy device was advanced into the inferior cortex of the right kidney and 2 core biopsies were obtained under direct ultrasound guidance. Images were saved for documentation purposes. The biopsy device was removed and hemostasis was obtained with manual compression. Post procedural scanning was negative for significant post procedural hemorrhage or additional complication. A dressing was placed. The patient tolerated the procedure well  without immediate post procedural complication. IMPRESSION: Technically successful ultrasound guided right renal biopsy. Electronically  Signed   By: Jacqulynn Cadet M.D.   On: 12/26/2020 14:14    Flora Lipps, MD  [facility] 12/27/2020  If 7PM-7AM, please contact night-coverage

## 2020-12-27 NOTE — Progress Notes (Addendum)
Received a call from [facility] with preliminary results, reading pathologist: Dr. Annye Rusk. Preliminary report is that he has acute tubulointersitial nephritis. This is likely reversible. The likely culprit is nivolumab. Will also change his PPI to a H2 blocker. Already on steroids, dose adjustment may be needed depending on his response.  If creatinine is no better tomorrow, then will increase his dose to 60mg  daily. Taper plan will likely be 4 weeks but his response to steroids will be dependent on this.  Dr. Louanne Belton and Dr. [name] updated. Updated [name] (wife) over the phone.  Awaiting preliminary report to be sent, final report will be pending.  Sean Quint, MD [facility]

## 2020-12-27 NOTE — Progress Notes (Signed)
Nutrition Follow-up  DOCUMENTATION CODES:   Non-severe (moderate) malnutrition in context of chronic illness  INTERVENTION:  - continue Ensure Enlive TID and Magic Cup TID.   NUTRITION DIAGNOSIS:   Moderate Malnutrition related to chronic illness (Metastatic melanoma and CA treatment, cognitive impairment) as evidenced by severe fat depletion,moderate fat depletion,mild muscle depletion,moderate muscle depletion. -ongoing  GOAL:   Patient will meet greater than or equal to 90% of their needs -unmet on average  MONITOR:   PO intake,Supplement acceptance,Labs,Weight trends  ASSESSMENT:   55 yo male with PMH of metastatic melanoma with mets to multiple organs including brain, seizure 2/2 brain mets, cognitive impairment, s/p craniotomy, and debility who presents with severe sepsis d/t community-acquired PNA and AKI.  Limited meal intakes documented since initial RD assessment on 4/6: 0% of lunch on 4/8; 0% of lunch on 4/9; 70% of dinner on 4/10. He has been accepting Ensure 75-90% of the time offered.   Patient reports eating well for breakfast, stating he had french toast, peaches, pears, and coffee. Reviewing order in [facility], it appears he received pancakes, sausage, an omelet, and bottled water.   Sitter at bedside reports she arrived after breakfast. Lunch has not yet been ordered and patient is unsure if he wants lunch.   No family/visitors present to assist with information. Flow sheet documentation indicates patient is a/o to self only.  S/p R kidney biopsy 4/12. Nephrology following.   Patient with metastatic melanoma with multiorgan involvement s/p cycle 4 of chemo on 4/4.  Weight yesterday was stable from weight on 4/4. Non-pitting edema to BLE documented in the edema section of flow sheet.    Labs reviewed; BUN: 26 mg/dl, creatinine: 2.66 mg/dl, Ca: 8.7 mg/dl, GFR: 27 ml/min. Medications reviewed; 1 tablet multivitamin with minerals/day, 40 mg oral protonix/day,  40 mg deltasone/day, 2 tablets senokot/day.    Diet Order:   Diet Order            Diet regular Room service appropriate? Yes; Fluid consistency: Thin  Diet effective now                 EDUCATION NEEDS:   Education needs have been addressed  Skin:  Skin Assessment: Reviewed RN Assessment Skin Integrity Issues:: Incisions Incisions: L posterior head  Last BM:  4/13  Height:   Ht Readings from Last 1 Encounters:  12/18/20 6' (1.829 m)    Weight:   Wt Readings from Last 1 Encounters:  12/25/20 78.6 kg     Estimated Nutritional Needs:  Kcal:  2350-2675 kcal Protein:  110-125 grams Fluid:  >/= 2.3 L/day      Jarome Matin, MS, RD, LDN, CNSC Inpatient Clinical Dietitian RD pager # available in [place]  After hours/weekend pager # available in [facility]

## 2020-12-28 ENCOUNTER — Encounter: Payer: Self-pay | Admitting: *Deleted

## 2020-12-28 ENCOUNTER — Telehealth: Payer: Self-pay | Admitting: Oncology

## 2020-12-28 DIAGNOSIS — C7931 Secondary malignant neoplasm of brain: Secondary | ICD-10-CM | POA: Diagnosis not present

## 2020-12-28 DIAGNOSIS — N10 Acute tubulo-interstitial nephritis: Secondary | ICD-10-CM

## 2020-12-28 DIAGNOSIS — R6520 Severe sepsis without septic shock: Secondary | ICD-10-CM | POA: Diagnosis not present

## 2020-12-28 DIAGNOSIS — J189 Pneumonia, unspecified organism: Secondary | ICD-10-CM | POA: Diagnosis not present

## 2020-12-28 DIAGNOSIS — A419 Sepsis, unspecified organism: Secondary | ICD-10-CM | POA: Diagnosis not present

## 2020-12-28 DIAGNOSIS — R7989 Other specified abnormal findings of blood chemistry: Secondary | ICD-10-CM | POA: Diagnosis not present

## 2020-12-28 LAB — BASIC METABOLIC PANEL
Anion gap: 10 (ref 5–15)
BUN: 26 mg/dL — ABNORMAL HIGH (ref 6–20)
CO2: 26 mmol/L (ref 22–32)
Calcium: 9 mg/dL (ref 8.9–10.3)
Chloride: 106 mmol/L (ref 98–111)
Creatinine, Ser: 2.39 mg/dL — ABNORMAL HIGH (ref 0.61–1.24)
GFR, Estimated: 31 mL/min — ABNORMAL LOW (ref >60)
Glucose, Bld: 96 mg/dL (ref 70–99)
Potassium: 3.9 mmol/L (ref 3.5–5.1)
Sodium: 142 mmol/L (ref 135–145)

## 2020-12-28 LAB — MAGNESIUM: Magnesium: 2 mg/dL (ref 1.7–2.4)

## 2020-12-28 LAB — MICROALBUMIN / CREATININE URINE RATIO
Creatinine, Urine: 59 mg/dL
Microalb Creat Ratio: 52 mg/g creat — ABNORMAL HIGH (ref 0–29)
Microalb, Ur: 30.6 ug/mL — ABNORMAL HIGH

## 2020-12-28 MED ORDER — LORAZEPAM 2 MG/ML IJ SOLN
1.0000 mg | Freq: Four times a day (QID) | INTRAMUSCULAR | Status: DC | PRN
  Administered 2020-12-28 – 2021-01-03 (×13): 1 mg via INTRAVENOUS
  Filled 2020-12-28 (×16): qty 1

## 2020-12-28 NOTE — Telephone Encounter (Signed)
Rescheduled appt per 4/14 sch msg  - left message for SNF and called wife and she is aware.

## 2020-12-28 NOTE — Progress Notes (Signed)
Physical Therapy Treatment Patient Details Name: Sean [name] MRN: [id number] DOB: 18-Apr-1966 Today's Date: 12/28/2020    History of Present Illness Sean [name] is a 55 y.o. male.  Presents to ER with concern for fever following cancer treatment on 12/18/20.  Pt admitted for Severe sepsis due to community-acquired pneumonia.  Patient has a history of metastatic melanoma.  Extensive brain mets.    PT Comments    Pt AxO x 1 pleasant.  Assisted OOB to amb.   General bed mobility comments: self able but requires repeat functional commands to complete.  General transfer comment: requires 100% VC's and 75% tactile cueing to follow commands and for direction.  Attempted a toilet transfer but was unable to direct pt to complete turn and "sit" on commode.  Pt reaching for various objects and crossing his legs.  Required physical Assist to "sit" on EOB. General Gait Details: very unsteady gait requiring assist for stabilizing especially with turning or with pt scissoring or narrowing BOS.  HIGH FALL RISK. Pt will need ST Rehab at SNF.  Follow Up Recommendations  SNF     Equipment Recommendations  None recommended by PT    Recommendations for Other Services       Precautions / Restrictions Precautions Precautions: Fall Precaution Comments: cognitive deficits    Mobility  Bed Mobility Overal bed mobility: Needs Assistance Bed Mobility: Supine to Sit     Supine to sit: Supervision     General bed mobility comments: self able but requires repeat functional commands to complete    Transfers Overall transfer level: Needs assistance Equipment used: 1 person hand held assist Transfers: Sit to/from Stand Sit to Stand: Min assist;Mod assist Stand pivot transfers: Mod assist;Max assist       General transfer comment: requires 100% VC's and 75% tactile cueing to follow commands and for direction.  Attempted a toilet transfer but was unable to direct pt to complete turn and "sit" on  commode.  Pt reaching for various objects and crossing his legs.  Required physical Assist to "sit" on EOB.  Ambulation/Gait Ambulation/Gait assistance: Mod assist;Min assist;Max assist Gait Distance (Feet): 45 Feet Assistive device: Rolling walker (2 wheeled);1 person hand held assist Gait Pattern/deviations: Step-to pattern;Decreased stride length;Ataxic;Narrow base of support Gait velocity: decreased   General Gait Details: very unsteady gait requiring assist for stabilizing especially with turning or with pt scissoring or narrowing BOS.  HIGH FALL RISK.   Stairs             Wheelchair Mobility    Modified Rankin (Stroke Patients Only)       Balance                                            Cognition Arousal/Alertness: Awake/alert Behavior During Therapy: Flat affect Overall Cognitive Status: History of cognitive impairments - at baseline Area of Impairment: Orientation;Attention;Memory;Following commands;Safety/judgement;Awareness;Problem solving                 Orientation Level: Disoriented to;Place;Time;Situation Current Attention Level: Sustained Memory: Decreased short-term memory Following Commands: Follows one step commands inconsistently Safety/Judgement: Decreased awareness of safety;Decreased awareness of deficits     General Comments: needs multimodal cues and redirection to tasks      Exercises      General Comments        Pertinent Vitals/Pain Pain Assessment: No/denies pain    Home  Living                      Prior Function            PT Goals (current goals can now be found in the care plan section) Progress towards PT goals: Progressing toward goals    Frequency    Min 2X/week      PT Plan Current plan remains appropriate    Co-evaluation              AM-PAC PT "6 Clicks" Mobility   Outcome Measure  Help needed turning from your back to your side while in a flat bed without  using bedrails?: A Lot Help needed moving from lying on your back to sitting on the side of a flat bed without using bedrails?: A Lot Help needed moving to and from a bed to a chair (including a wheelchair)?: A Lot Help needed standing up from a chair using your arms (e.g., wheelchair or bedside chair)?: A Lot Help needed to walk in hospital room?: A Lot Help needed climbing 3-5 steps with a railing? : Total 6 Click Score: 11    End of Session Equipment Utilized During Treatment: Gait belt Activity Tolerance: Patient tolerated treatment well Patient left: in bed;with bed alarm set;with call bell/phone within reach Nurse Communication: Mobility status PT Visit Diagnosis: Unsteadiness on feet (R26.81);Difficulty in walking, not elsewhere classified (R26.2)     Time: 1517-6160 PT Time Calculation (min) (ACUTE ONLY): 28 min  Charges:  $Gait Training: 8-22 mins $Therapeutic Activity: 8-22 mins                     Rica Koyanagi  PTA Acute  Rehabilitation Services Pager      [phone] Office      [phone]

## 2020-12-28 NOTE — Progress Notes (Signed)
Per Dr. [name]: Move out his 01/01/21 appointment to 2 weeks from then. Scheduling message sent.

## 2020-12-28 NOTE — Progress Notes (Signed)
IP PROGRESS NOTE  Subjective:   Sean [name] has no complaint.  He denies pain.  Objective: Vital signs in last 24 hours: Blood pressure 116/80, pulse 86, temperature 98.2 F (36.8 C), resp. rate 18, height 6' (1.829 m), weight 173 lb 4.5 oz (78.6 kg), SpO2 96 %.  Intake/Output from previous day: 04/13 0701 - 04/14 0700 In: 120 [P.O.:120] Out: 500 [Urine:500]  Physical Exam:  HEENT: No thrush Abdomen: Nontender, no hepatosplenomegaly Extremities: No leg edema Skin: No rash Neurologic: Alert, oriented to diagnosis and hospital, not year, moves all extremities to command  Lab Results: Recent Labs    12/26/20 0741 12/27/20 0452  WBC 7.7 9.2  HGB 11.4* 11.6*  HCT 33.8* 35.3*  PLT 255 325    BMET Recent Labs    12/27/20 0452 12/28/20 0438  NA 141 142  K 3.9 3.9  CL 104 106  CO2 25 26  GLUCOSE 110* 96  BUN 26* 26*  CREATININE 2.66* 2.39*  CALCIUM 8.7* 9.0    Lab Results  Component Value Date   CEA1 0.6 09/15/2020    Medications: I have reviewed the patient's current medications.  Assessment/Plan:  1. 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, unknown primary tumor site  CT brain 09/14/2020-multiple hemorrhagic metastases with surrounding edema in the bilateral cerebral hemispheres  CTs chest, abdomen, pelvis 09/14/2020-1.4 cm right upper lobe subpleural nodule, 0.5 cm right lower lobe nodule, multiple hypodensities in the liver suspicious for metastases, 1.5 cm right upper pole hypodense kidney lesion-indeterminate  MRI abdomen 09/15/2020-multifocal T1 hyperintense liver lesions, no kidney mass  Ultrasound-guided biopsy of a segment 4B liver lesion 09/18/2020-benign liver parenchyma with steatosis, no evidence of malignancy  PET 09/25/2020-focal area of hypermetabolism in the right hilum, multiple areas of the liver, pelvic musculature, and left thigh-no CT correlate with any of these areas on the noncontrast CT, 10 mm anterior  right upper lobe nodule with low-level hypermetabolism, additional tiny pulmonary nodules too small to characterize by PET  SRS to 10 brain lesions 09/27/2020  Resection of left occipital and left frontal lesions on 09/29/2020-pathology consistent with malignant melanoma  Cycle 1 ipilimumab/nivolumab 10/16/2020  CT of the brain without contrast 10/26/2020 showed significant interval increase in the number and size of numerous metastases  Brain MRI 10/24/2020-multiple new and increased brain metastases, moderate edema surrounding multiple lesions, new mass-effect at the right lateral ventricle  Brain MRI 11/01/2020-stable appearance of his multiple intracranial metastases and associated edema and mild mass-effect.  CT of the chest with contrast 11/01/2020-interval progression of bilateral pulmonary nodules concerning for metastatic disease, small right hilar lymph nodes, new and progressive ill-defined lesions in the liver compatible with progression of metastatic involvement.  Cycle 2 nivolumab 11/10/2020  Cycle 3 nivolumab 11/27/2020  MRI brain 12/13/2020-decrease in size of hemorrhagic brain metastases, improvement in vasogenic edema and mass-effect, new areas of nonhemorrhagic enhancement by frontal and right parietal white matter-nonspecific  CT chest 12/08/2020-decreased size of lung and liver metastases  Cycle 4 nivolumab 12/18/2020 2.Left-sided weakness, new onset seizure secondary to #1, weakness has resolved 3.Hyperlipidemia 4.Elevated liver enzymes and bilirubin  Liver enzymes persistently elevated 5.Headache and nausea/vomiting secondary to #1, resolved 6. Hospital admission 10/20/2020-encephalopathy, slow Decadron taper-down to 2 mg at discharge 11/09/2020 7.  Admission 12/18/2020 with fever 8.  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  He has been evaluated by Dr. [name] for renal failure.  A renal  biopsy (preliminary report)  is consistent with acute nephritis.  He appears to have nivolumab induced nephritis.  He is now maintained on prednisone with a plan for a slow taper.  The creatinine is improved today.  Nivolumab will remain on hold.  We will consider a rechallenge with nivolumab if the prednisone can be tapered to less than or equal to 10 mg daily.  Recommendations: 1.  Continue OT/PT, return to the skilled nursing facility at discharge 2.  Continue Keppra.  He will have outpatient follow-up with neuro-oncology. 3.  Prednisone per nephrology, close follow-up of the serum creatinine 4.  Outpatient follow-up will be scheduled at the Cancer center.  Previously planned follow-up at the cancer center on 01/01/2021 will be canceled.  An outpatient follow-up will be scheduled in 2-3 weeks. 5.  Please call oncology as needed   LOS: 9 days   Betsy Coder, MD   12/28/2020, 1:22 PM

## 2020-12-28 NOTE — Progress Notes (Addendum)
[facility] Progress Note    Assessment/ Plan:   AKI (nonoliguric) secondary to acute tubulointerstitial nephritis (biopsy proven): -Baseline Cr is around 0.7-0.9 -renal u/s 4/12:  No acute abnormalities/obstruction -UA w/ microscopy 4/11: 11-20 WBC's but rare bacteria -s/p native kidney 12/26/20: acute tubulointerstitial nephritis which is very acute, no chronicity (this is reversible). Likely hypersensitivity initiated by drug therapy (this is a preliminary report, will upload it to the media tab). Final report is pending. -Cr stable/improved this AM, he remains non-oliguric -Rather difficult situation, do not want to blunt his immune response given that he has responded to nivolumab. Rather than starting at the traditional 1mg /kg dosing for prednisone, started at a lower dose: prednisone 40mg  daily (started 4/12). Decadron d/c'ed. Prednisone taper plan dependent on biopy results and response to treatment. Overall, I am hoping that we can taper him within 4 weeks down to his baseline regimen of decadron 2mg  daily but time will dictate this. -if PO intake is truly poor, then would favor mIVF -Continue to monitor daily Cr, Dose meds for GFR<15 -Monitor Daily I/Os, Daily weight, daily labs  -Maintain MAP>65 for optimal renal perfusion.  -Avoid further nephrotoxins including NSAIDS, Morphine.  Unless absolutely necessary, avoid CT with contrast and/or MRI with gadolinium.     Severe Sepsis secondary to suspected CAP -s/p 5 day course of rocephin and zithromax. Remains afebrile  Relative hypotension -post biopsy, likely secondary to sedation, resolved, normotensive now  Metastatic melanoma -multiorgan involvement, receiving nivolumab, s/p cycle 4 on 12/18/20. Onc on board, appreciate recommendations. Case discussed with Dr. [name]. 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 Her concern is his disposition, she does not want him to go back to [place]. Discussed with primary service. If dispo is sorted out and kidney function remains stable, then would be okay for discharge from a nephrology perspective with plans of outpatient follow up at our office.  Gean Quint, MD [facility]  Subjective:   No acute events. More drowsy this am. Had taken his meds and drank water but breakfast tray at his bedside is untouched. Opens eyes and briefly responds on vocal and tactile stimuli but falls right back to sleep.   Objective:   BP 116/80 (BP Location: Left Arm)   Pulse 86   Temp 98.2 F (36.8 C)   Resp 18   Ht 6' (1.829 m)   Wt 78.6 kg   SpO2 96%   BMI 23.50 kg/m   Intake/Output Summary (Last 24 hours) at 12/28/2020 1131 Last data filed at 12/27/2020 1700 Gross per 24 hour  Intake 120 ml  Output 500 ml  Net -380 ml   Weight change:   Physical Exam: Gen:nad, laying flat in bed, drowsy HEENT: MMM CVS:s1s2, rrr Resp:cta bl, no w/r/r/c, unlabored EVO:JJKK, nt/nd Ext:no edema Neuro: drowsy, opens eyes and briefly responds to vocal and tactile stimuli  Imaging: US BIOPSY (KIDNEY)  Result Date: 12/26/2020 INDICATION: 55 year old male with a history of melanoma metastatic to the brain and now acute kidney injury. Concern for drug related nephritis. He presents for random renal biopsy. EXAM: ULTRASOUND GUIDED RENAL BIOPSY COMPARISON:  None.  MEDICATIONS: Fentanyl 50 mcg IV; Versed 4 mg IV ANESTHESIA/SEDATION: Total Moderate Sedation time 10 minutes The patient's vital signs and level of consciousness were monitored continuously by radiology  nursing under my direct supervision. COMPLICATIONS: None immediate PROCEDURE: Informed written consent was obtained from the patient after a discussion of the risks, benefits and alternatives to treatment. The patient understands and consents the procedure. A timeout was performed prior to the initiation of the procedure. Ultrasound scanning was performed of the bilateral flanks. The inferior pole of the right kidney was selected for biopsy due to location and sonographic window. The procedure was planned. The operative site was prepped and draped in the usual sterile fashion. The overlying soft tissues were anesthetized with 1% lidocaine with epinephrine. A 17 gauge core needle biopsy device was advanced into the inferior cortex of the right kidney and 2 core biopsies were obtained under direct ultrasound guidance. Images were saved for documentation purposes. The biopsy device was removed and hemostasis was obtained with manual compression. Post procedural scanning was negative for significant post procedural hemorrhage or additional complication. A dressing was placed. The patient tolerated the procedure well without immediate post procedural complication. IMPRESSION: Technically successful ultrasound guided right renal biopsy. Electronically Signed   By: Jacqulynn Cadet M.D.   On: 12/26/2020 14:14    Labs: BMET Recent Labs  Lab 12/22/20 0910 12/23/20 0451 12/25/20 0502 12/26/20 0741 12/27/20 0452 12/28/20 0438  NA 146* 145 138 140 141 142  K 3.3* 3.9 3.4* 3.6 3.9 3.9  CL 115* 116* 107 106 104 106  CO2 24 20* 23 24 25 26   GLUCOSE 110* 91 100* 106* 110* 96  BUN 16 12 13 19  26* 26*  CREATININE 1.61* 1.50* 2.18* 2.42* 2.66* 2.39*  CALCIUM 7.8* 8.0* 8.3* 8.4* 8.7* 9.0  PHOS  --  3.3  --  4.9* 4.0  --    CBC Recent Labs  Lab 12/23/20 0451 12/26/20 0741 12/27/20 0452  WBC 9.0 7.7 9.2  HGB 10.8* 11.4* 11.6*  HCT 32.5* 33.8* 35.3*  MCV 95.0 94.9 94.9  PLT 212 255 325    Medications:     . citalopram  10 mg Oral QHS  . famotidine  20 mg Oral Daily  . feeding supplement  237 mL Oral TID BM  . levETIRAcetam  500 mg Oral BID  . multivitamin with minerals  1 tablet Oral Daily  . predniSONE  40 mg Oral Q breakfast  . senna-docusate  2 tablet Oral QHS      Gean Quint, MD [facility] 12/28/2020, 11:31 AM

## 2020-12-28 NOTE — Progress Notes (Signed)
0902- Patient repeatedly trying to get out of bed even after using the bathroom. Unable to re-direct him after repeated attempts to reorient him, very impulsive and unaware of surroundings. Wife is unable to come sit with him today due to working.No sitter available. MD was notified.New orders were given.    1800-Patient unable to be re-directed found naked in room, continually getting out of bed. He is unsteady on his feet and is a safety risk. Gave PRN ativan as ordered.  1900- Patient calm and is resting in bed. Sitter at bedside for safety.

## 2020-12-28 NOTE — Plan of Care (Signed)

## 2020-12-28 NOTE — Progress Notes (Addendum)
PROGRESS NOTE  Sean [name] CHE:[id number] DOB: 1965/12/14 DOA: 12/18/2020 PCP: [name], MD   LOS: 9 days   Brief narrative: Sean [name] is a 55 y.o. 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  Patient does have baseline cognitive dysfunction due to metastatic brain disease and is only oriented to self and place.  History was obtained from the patient's family who reported a fever of 101.4 F with increased breathing.  In the ED patient was noted to be slightly tachycardic and had mild hypotension.  Laboratory data showed normal leukocyte count but lactate was elevated at 2.4 which trended up to 2.7.  COVID-19 and influenza test was negative.  Chest x-ray was reported as left lower lobe airspace opacity.  In the ED, CT head was performed which showed stable but extensive hemorrhagic metastasis.  Patient received 2 L of lactated ringer, Rocephin and Zithromax and was admitted to hospital for further evaluation and treatment.  During hospitalization, patient was noted to have elevated creatinine which started trending up despite IV fluids.  He was more agitated and required one-to-one sitter and Ativan for agitation.  Nephrology was consulted due to concern for nivolumab induced toxicity.  IR guided biopsy was done on 12/26/2020 which showed tubular interstitial nephritis.  Patient was continued on prednisone for this.  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  Completed a 5-day course of  Rocephin and Zithromax.    Agitation, confusion.  Intermittently requiring Ativan and one-to-one sitter.   Could not use Haldol due to history of seizures.  Continue  Ativan as needed for extreme agitation.  We will continue to hold discontinue ritalin for now.  Could start at a lower dose when started.   Metastatic melanoma-with extensive mets including brain.   S/p craniotomy and resection of left occipital and left frontal lesion in 09/2020.  CT head shows stable extensive hemorrhagic mets.    Followed by Dr [name], oncology.  Was on dexamethasone.  Has been changed to prednisone  as per nephrology and oncology recommendation in view of tubulointerstitial nephritis.  Continue Keppra.    Acute kidney injury   Baseline creatinine of around 0.6-0.7.  Initially received IV fluids but his creatinine started trending up.   Bladder scan did not show any residual urine volume.  Nephrology was consulted  due to possibility of nivolumab induced toxicity.  Renal ultrasound was negative for acute findings.  Biopsy was performed by interventional radiology on 12/26/2020 with findings of acute tubulointerstitial nephritis.Marland Kitchen  Dexamethasone has been changed to prednisone.  Follow oncology and nephrology recommendation.  Creatinine has started to trend down.  With Dr. [name] nephrology.  Patient might be stable for discharge on oral prednisone and he would follow the patient as outpatient.  He was okay for discharge if his disposition was certain.  Lab Results  Component Value Date   CREATININE 2.39 (H) 12/28/2020   CREATININE 2.66 (H) 12/27/2020   CREATININE 2.42 (H) 12/26/2020    History of seizure due to brain mets Continue Keppra.  Elevated LFTs.  Likely secondary to metastatic disease. Hepatitis panel was nonreactive.  Latest AST 50 ALT 110.  We will check in a.m.  Hypokalemia: Replenished. Potassium of 3.9 today.  Mild hypernatremia.  Improved. Sodium level of 142.  Cognitive impairment likely  from brain mets.   Alert, awake and oriented to person only,  Requiring one-to-one sitter and Ativan for agitation. Gets agitated intermittently.  Generalized weakness/debility Continue physical therapy, occupational therapy evaluation.  Patient is currently at the skilled nursing facility level of care and awaiting for medical stabilization prior to discharge.  DVT prophylaxis: SCDs Start: 12/19/20 1696  Code Status: Partial code   Family Communication:  I spoke with Ms [name], spouse  and updated her about the clinical condition of the patient. Patient's spouse states that [facility] might not be able to take care of his needs. She wants to talk with the [facility] about the game plan.   Status is: Inpatient  Remains inpatient appropriate because:IV treatments appropriate due to intensity of illness or inability to take PO and Inpatient level of care appropriate due to severity of illness, acute kidney injury on prednisone, awaiting for skilled nursing facility placement  Dispo: The patient is from: SNF              Anticipated d/c is to: SNF, likely in 2 to 3 days.  Nephrology on board for acute kidney injury, spoke at the progression rounds.               Patient currently is  not medically stable to d/c.   Difficult to place patient No  Consultants:  Medical oncology  Nephrology  Interventional radiology  Procedures:  IR guided renal biopsy on 12/26/2020  Anti-infectives:  Marland Kitchen Rocephin and Zithromax 4/4>4/9  Subjective:  Today, patient was seen and examined at bedside.  No sitter today, sitting on bed. Feels sleepy. No cough, shortness of breath or fever.    Objective: Vitals:   12/27/20 2115 12/28/20 0541  BP: 106/73 116/80  Pulse: 70 86  Resp: 18 18  Temp: 98 F (36.7 C) 98.2 F (36.8 C)  SpO2: 97% 96%    Intake/Output Summary (Last 24 hours) at 12/28/2020 0813 Last data filed at 12/27/2020 1700 Gross per 24 hour  Intake 120 ml  Output 500 ml  Net -380 ml    Filed Weights   12/18/20 1709 12/18/20 2230 12/25/20 1400  Weight: 77.1 kg 79.3 kg 78.6 kg   Body mass index is 23.5 kg/m.   Physical Exam: General:  Average built, not in obvious distress, oriented to self, has baseline cognitive dysfunction HENT:   No scleral pallor or icterus noted. Oral mucosa is moist.  Chest:  Clear breath sounds.  Diminished breath sounds bilaterally. No crackles or wheezes.  CVS: S1 &S2 heard. No murmur.  Regular rate and rhythm. Abdomen: Soft, nontender, nondistended.  Bowel sounds are heard.   Extremities: No cyanosis, clubbing or edema.  Peripheral pulses are palpable. Psych: Alert, awake and oriented to self, cognitive dysfunction, CNS: Moving all extremities. Skin: Warm and dry.  No rashes noted.   Data Review: I have personally reviewed the following laboratory data and studies,  CBC: Recent Labs  Lab 12/23/20 0451 12/26/20 0741 12/27/20 0452  WBC 9.0 7.7 9.2  HGB 10.8* 11.4* 11.6*  HCT 32.5* 33.8* 35.3*  MCV 95.0 94.9 94.9  PLT 212 255 789   Basic Metabolic Panel: Recent Labs  Lab 12/23/20 0451 12/25/20 0502 12/26/20 0741 12/27/20 0452 12/28/20  0438  NA 145 138 140 141 142  K 3.9 3.4* 3.6 3.9 3.9  CL 116* 107 106 104 106  CO2 20* 23 24 25 26   GLUCOSE 91 100* 106* 110* 96  BUN 12 13 19  26* 26*  CREATININE 1.50* 2.18* 2.42* 2.66* 2.39*  CALCIUM 8.0* 8.3* 8.4* 8.7* 9.0  MG 1.7  --   --  2.0 2.0  PHOS 3.3  --  4.9* 4.0  --    Liver Function Tests: Recent Labs  Lab 12/23/20 0451 12/26/20 0741  AST 46* 50*  ALT 119* 110*  ALKPHOS 124 115  BILITOT 0.2* 0.7  PROT 5.4* 5.8*  ALBUMIN 2.4* 2.8*   No results for input(s): LIPASE, AMYLASE in the last 168 hours. No results for input(s): AMMONIA in the last 168 hours. Cardiac Enzymes: No results for input(s): CKTOTAL, CKMB, CKMBINDEX, TROPONINI in the last 168 hours. BNP (last 3 results) No results for input(s): BNP in the last 8760 hours.  ProBNP (last 3 results) No results  for input(s): PROBNP in the last 8760 hours.  CBG: No results for input(s): GLUCAP in the last 168 hours. Recent Results (from the past 240 hour(s))  Blood Culture (routine x 2)     Status: Abnormal   Collection Time: 12/18/20  5:10 PM   Specimen: BLOOD RIGHT FOREARM  Result Value Ref Range Status   Specimen Description   Final    BLOOD RIGHT FOREARM Performed at [facility], [address] [place], [place] [address]    Special Requests   Final    BOTTLES DRAWN AEROBIC ONLY Blood Culture results may not be optimal due to an inadequate volume of blood received in culture bottles Performed at [facility]    Culture  Setup Time   Final    AEROBIC BOTTLE ONLY GRAM POSITIVE COCCI CRITICAL RESULT CALLED TO, READ BACK BY AND VERIFIED WITH: E JACKSON PHARMD 12/20/20 0301 JDW    Culture (A)  Final    STAPHYLOCOCCUS EPIDERMIDIS THE SIGNIFICANCE OF ISOLATING THIS ORGANISM FROM A SINGLE SET OF BLOOD CULTURES WHEN MULTIPLE SETS ARE DRAWN IS UNCERTAIN. PLEASE NOTIFY THE MICROBIOLOGY DEPARTMENT WITHIN ONE WEEK IF SPECIATION AND SENSITIVITIES ARE REQUIRED. Performed at [facility], [place] [address] [place], [place] [address]    Report Status 12/21/2020 FINAL  Final  Blood Culture ID Panel (Reflexed)     Status: Abnormal   Collection Time: 12/18/20  5:10 PM  Result Value Ref Range Status   Enterococcus faecalis NOT DETECTED NOT DETECTED Final   Enterococcus Faecium NOT DETECTED NOT DETECTED Final   Listeria monocytogenes NOT DETECTED NOT DETECTED Final   Staphylococcus species DETECTED (A) NOT DETECTED Final    Comment: CRITICAL RESULT CALLED TO, READ BACK BY AND VERIFIED WITH: E JACKSON PHARMD 12/20/20 0301 JDW    Staphylococcus aureus (BCID) NOT DETECTED NOT DETECTED Final   Staphylococcus epidermidis DETECTED (A) NOT DETECTED Final    Comment: CRITICAL RESULT CALLED TO, READ BACK BY AND VERIFIED WITH: E JACKSON PHARMD 12/20/20 0301 JDW    Staphylococcus  lugdunensis NOT DETECTED NOT DETECTED Final   Streptococcus species NOT DETECTED NOT DETECTED Final   Streptococcus agalactiae NOT DETECTED NOT DETECTED Final   Streptococcus pneumoniae NOT DETECTED NOT DETECTED Final   Streptococcus pyogenes NOT DETECTED NOT DETECTED Final   A.calcoaceticus-baumannii NOT DETECTED NOT DETECTED Final   Bacteroides fragilis NOT DETECTED NOT DETECTED Final   Enterobacterales NOT DETECTED NOT DETECTED Final   Enterobacter  cloacae complex NOT DETECTED NOT DETECTED Final   Escherichia coli NOT DETECTED NOT DETECTED Final   Klebsiella aerogenes NOT DETECTED NOT DETECTED Final   Klebsiella oxytoca NOT DETECTED NOT DETECTED Final   Klebsiella pneumoniae NOT DETECTED NOT DETECTED Final   Proteus species NOT DETECTED NOT DETECTED Final   Salmonella species NOT DETECTED NOT DETECTED Final   Serratia marcescens NOT DETECTED NOT DETECTED Final   Haemophilus influenzae NOT DETECTED NOT DETECTED Final   Neisseria meningitidis NOT DETECTED NOT DETECTED Final   Pseudomonas aeruginosa NOT DETECTED NOT DETECTED Final   Stenotrophomonas maltophilia NOT DETECTED NOT DETECTED Final   Candida albicans NOT DETECTED NOT DETECTED Final   Candida auris NOT DETECTED NOT DETECTED Final   Candida glabrata NOT DETECTED NOT DETECTED Final   Candida krusei NOT DETECTED NOT DETECTED Final   Candida parapsilosis NOT DETECTED NOT DETECTED Final   Candida tropicalis NOT DETECTED NOT DETECTED Final   Cryptococcus neoformans/gattii NOT DETECTED NOT DETECTED Final   Methicillin resistance mecA/C NOT DETECTED NOT DETECTED Final    Comment: Performed at [facility], [place] [address] [place], [place] [address]  Resp Panel by RT-PCR (Flu A&B, Covid) Nasopharyngeal Swab     Status: None   Collection Time: 12/18/20  5:36 PM   Specimen: Nasopharyngeal Swab; Nasopharyngeal(NP) swabs in vial transport medium  Result Value Ref Range Status   SARS Coronavirus 2 by RT PCR NEGATIVE NEGATIVE Final     Comment: (NOTE) SARS-CoV-2 target nucleic acids are NOT DETECTED.  The SARS-CoV-2 RNA is generally detectable in upper respiratory specimens during the acute phase of infection. The lowest concentration of SARS-CoV-2 viral copies this assay can detect is 138 copies/mL. A negative result does not preclude SARS-Cov-2 infection and should not be used as the sole basis for treatment or other patient management decisions. A negative result may occur with  improper specimen collection/handling, submission of specimen other than nasopharyngeal swab, presence of viral mutation(s) within the areas targeted by this assay, and inadequate number of viral copies(<138 copies/mL). A negative result must be combined with clinical observations, patient history, and epidemiological information. The expected result is Negative.  Fact Sheet for Patients:  EntrepreneurPulse.com.au  Fact Sheet for Healthcare Providers:  IncredibleEmployment.be  This test is no t yet approved or cleared by the [place] FDA and  has been authorized for detection and/or diagnosis of SARS-CoV-2 by FDA under an Emergency Use Authorization (EUA). This EUA will remain  in effect (meaning this test can be used) for the duration of the COVID-19 declaration under Section 564(b)(1) of the Act, 21 U.S.C.section 360bbb-3(b)(1), unless the authorization is terminated  or revoked sooner.       Influenza A by PCR NEGATIVE NEGATIVE Final   Influenza B by PCR NEGATIVE NEGATIVE Final    Comment: (NOTE) The Xpert Xpress SARS-CoV-2/FLU/RSV plus assay is intended as an aid in the diagnosis of influenza from Nasopharyngeal swab specimens and should not be used as a sole basis for treatment. Nasal washings and aspirates are unacceptable for Xpert Xpress SARS-CoV-2/FLU/RSV testing.  Fact Sheet for Patients: EntrepreneurPulse.com.au  Fact Sheet for Healthcare  Providers: IncredibleEmployment.be  This test is not yet approved or cleared by the [place] FDA and has been authorized for detection and/or diagnosis of SARS-CoV-2 by FDA under an Emergency Use Authorization (EUA). This EUA will remain in effect (meaning this test can be used) for the duration of the COVID-19 declaration under Section 564(b)(1) of the Act, 21 U.S.C. section  360bbb-3(b)(1), unless the authorization is terminated or revoked.  Performed at [facility]   Blood Culture (routine x 2)     Status: None   Collection Time: 12/18/20  5:38 PM   Specimen: BLOOD  Result Value Ref Range Status   Specimen Description BLOOD LEFT ANTECUBITAL  Final   Special Requests   Final    Blood Culture adequate volume BOTTLES DRAWN AEROBIC AND ANAEROBIC   Culture   Final    NO GROWTH 5 DAYS Performed at [facility], [place] [address], [place], [place] [address]    Report Status 12/23/2020 FINAL  Final  Culture, Urine     Status: None   Collection Time: 12/19/20 10:30 AM   Specimen: Urine, Random  Result Value Ref Range Status   Specimen Description   Final    URINE, RANDOM Performed at [facility] [address] [place], [place] [address]    Special Requests   Final    NONE Performed at [facility], [place] [address], [place], [place] [address]    Culture   Final    NO GROWTH Performed at [facility], [place] [address], [place], [place] [address]    Report Status 12/20/2020 FINAL  Final  MRSA PCR Screening     Status: None   Collection Time: 12/24/20 10:07 AM   Specimen: Nasal Mucosa; Nasopharyngeal  Result Value Ref Range Status   MRSA by PCR NEGATIVE NEGATIVE Final    Comment:        The GeneXpert MRSA Assay (FDA approved for NASAL specimens only), is one component of a comprehensive MRSA colonization surveillance program. It is not intended to diagnose MRSA infection nor to guide  or monitor treatment for MRSA infections. Performed at [facility], [place] [address], [place], [place] [address]      Studies: US BIOPSY (KIDNEY)  Result Date: 12/26/2020 INDICATION: 55 year old male with a history of melanoma metastatic to the brain and now acute kidney injury. Concern for drug related nephritis. He presents for random renal biopsy. EXAM: ULTRASOUND GUIDED RENAL BIOPSY COMPARISON:  None. MEDICATIONS: Fentanyl 50 mcg IV; Versed 4 mg IV ANESTHESIA/SEDATION: Total Moderate Sedation time 10 minutes The patient's vital signs and level of consciousness were monitored continuously by radiology nursing under my direct supervision. COMPLICATIONS: None immediate PROCEDURE: Informed written consent was obtained from the patient after a discussion of the risks, benefits and alternatives to treatment. The patient understands and consents the procedure. A timeout was performed prior to the initiation of the procedure. Ultrasound scanning was performed of the bilateral flanks. The inferior pole of the right kidney was selected for biopsy due to location and sonographic window. The procedure was planned. The operative site was prepped and draped in the usual sterile fashion. The overlying soft tissues were anesthetized with 1% lidocaine with epinephrine. A 17 gauge core needle biopsy device was advanced into the inferior cortex of the right kidney and 2 core biopsies were obtained under direct ultrasound guidance. Images were saved for documentation purposes. The biopsy device was removed and hemostasis was obtained with manual compression. Post procedural scanning was negative for significant post procedural hemorrhage or additional complication. A dressing was placed. The patient tolerated the procedure well without immediate post procedural complication. IMPRESSION: Technically successful ultrasound guided right renal biopsy. Electronically Signed   By: Jacqulynn Cadet M.D.   On:  12/26/2020 14:14    Flora Lipps, MD  [facility]  12/28/2020  If 7PM-7AM, please contact night-coverage

## 2020-12-28 NOTE — TOC Progression Note (Addendum)
Transition of Care [facility]) - Progression Note    Patient Details  Name: Sean [name] MRN: [id number] Date of Birth: Dec 29, 1965  Transition of Care [facility]) CM/SW Contact  Maidie Streight, Juliann Pulse, RN Phone Number: 12/28/2020, 12:38 PM  Clinical Narrative: Spoke to spouse Ernestina Patches is concerned about [place] being able to provided needed services-she will call [employer] today w/concerns-she also mentioned she is alpplying to his VA benefits-CM explained that sometimes that process takes time-but we will follow along for d/c plans-she mentioned he was using [facility] for [facility] Prior to admission-I have informed [employer] rep Tommi Rumps to follow along.Informed [name] try her best to let us know he d/c plans so we can asst prior to d/c. Informed her of custodial level care-out of pocket cost/not covered by insurance-she voiced understanding.  1:35p-[employer] rep Tommi Rumps states he cannot accept back-informed spouse Denise-voiced understanding.  3p-Faxed w/confirmation PT recent note-fax#[phone].    Expected Discharge Plan: Skilled Nursing Facility Barriers to Discharge: Ship broker  Expected Discharge Plan and Services Expected Discharge Plan: [place]   Discharge Planning Services: CM Consult Post Acute Care Choice: Resumption of Svcs/PTA Provider Living arrangements for the past 2 months: Single Family Home,Skilled Nursing Facility                                       Social Determinants of Health (SDOH) Interventions    Readmission Risk Interventions No flowsheet data found.

## 2020-12-28 NOTE — TOC Progression Note (Signed)
Transition of Care [facility]) - Progression Note    Patient Details  Name: Marcus Schwandt MRN: [id number] Date of Birth: 1966-07-10  Transition of Care [facility]) CM/SW Contact  Brandis Matsuura, Juliann Pulse, RN Phone Number: 12/28/2020, 10:53 AM  Clinical Narrative: Will send updated PT/OT/MD notes-[facility] rep Lorenso Courier has started auth-awaiting auth.fax clinicals to fax#[phone].      Expected Discharge Plan: Skilled Nursing Facility Barriers to Discharge: Ship broker  Expected Discharge Plan and Services Expected Discharge Plan: [place]   Discharge Planning Services: CM Consult Post Acute Care Choice: Resumption of Svcs/PTA Provider Living arrangements for the past 2 months: Single Family Home,Skilled Nursing Facility                                       Social Determinants of Health (SDOH) Interventions    Readmission Risk Interventions No flowsheet data found.

## 2020-12-29 DIAGNOSIS — C7931 Secondary malignant neoplasm of brain: Secondary | ICD-10-CM | POA: Diagnosis not present

## 2020-12-29 DIAGNOSIS — A419 Sepsis, unspecified organism: Secondary | ICD-10-CM | POA: Diagnosis not present

## 2020-12-29 DIAGNOSIS — J189 Pneumonia, unspecified organism: Secondary | ICD-10-CM | POA: Diagnosis not present

## 2020-12-29 DIAGNOSIS — N10 Acute tubulo-interstitial nephritis: Secondary | ICD-10-CM | POA: Diagnosis not present

## 2020-12-29 LAB — CBC
HCT: 33.2 % — ABNORMAL LOW (ref 39.0–52.0)
Hemoglobin: 11.3 g/dL — ABNORMAL LOW (ref 13.0–17.0)
MCH: 31.1 pg (ref 26.0–34.0)
MCHC: 34 g/dL (ref 30.0–36.0)
MCV: 91.5 fL (ref 80.0–100.0)
Platelets: UNDETERMINED 10*3/uL (ref 150–400)
RBC: 3.63 MIL/uL — ABNORMAL LOW (ref 4.22–5.81)
RDW: 13.4 % (ref 11.5–15.5)
WBC: 7.3 10*3/uL (ref 4.0–10.5)
nRBC: 0 % (ref 0.0–0.2)

## 2020-12-29 LAB — COMPREHENSIVE METABOLIC PANEL
ALT: 82 U/L — ABNORMAL HIGH (ref 0–44)
AST: 31 U/L (ref 15–41)
Albumin: 2.9 g/dL — ABNORMAL LOW (ref 3.5–5.0)
Alkaline Phosphatase: 107 U/L (ref 38–126)
Anion gap: 9 (ref 5–15)
BUN: 27 mg/dL — ABNORMAL HIGH (ref 6–20)
CO2: 26 mmol/L (ref 22–32)
Calcium: 8.9 mg/dL (ref 8.9–10.3)
Chloride: 106 mmol/L (ref 98–111)
Creatinine, Ser: 2.26 mg/dL — ABNORMAL HIGH (ref 0.61–1.24)
GFR, Estimated: 33 mL/min — ABNORMAL LOW (ref >60)
Glucose, Bld: 88 mg/dL (ref 70–99)
Potassium: 3.8 mmol/L (ref 3.5–5.1)
Sodium: 141 mmol/L (ref 135–145)
Total Bilirubin: 0.9 mg/dL (ref 0.3–1.2)
Total Protein: 6 g/dL — ABNORMAL LOW (ref 6.5–8.1)

## 2020-12-29 LAB — MAGNESIUM: Magnesium: 2.1 mg/dL (ref 1.7–2.4)

## 2020-12-29 NOTE — TOC Progression Note (Signed)
Transition of Care [facility]) - Progression Note    Patient Details  Name: Nikolaj Geraghty MRN: [id number] Date of Birth: 22-Mar-1966  Transition of Care [facility]) CM/SW Contact  Ross Ludwig, [place] Phone Number: 12/29/2020, 2:07 PM  Clinical Narrative:     Still waiting for insurance authorization, CSW to continue to follow patient's progress throughout discharge planning.   Expected Discharge Plan: Skilled Nursing Facility Barriers to Discharge: Ship broker  Expected Discharge Plan and Services Expected Discharge Plan: [place]   Discharge Planning Services: CM Consult Post Acute Care Choice: Resumption of Svcs/PTA Provider Living arrangements for the past 2 months: Single Family Home,Skilled Nursing Facility                                       Social Determinants of Health (SDOH) Interventions    Readmission Risk Interventions No flowsheet data found.

## 2020-12-29 NOTE — Progress Notes (Signed)
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  Patient does have baseline cognitive dysfunction due to metastatic brain disease and is only oriented to self and place.  History was obtained from the patient's family who reported a fever of 101.4 F with increased breathing.  In the ED patient was noted to be slightly tachycardic and had mild hypotension.  Laboratory data showed normal leukocyte count but lactate was elevated at 2.4 which trended up to 2.7.  COVID-19 and influenza test was negative.  Chest x-ray was reported as left lower lobe airspace opacity.  In the ED, CT head was performed which showed stable but extensive hemorrhagic metastasis.  Patient received 2 L of lactated ringer, Rocephin and Zithromax and was admitted to hospital for further evaluation and treatment.  During hospitalization, patient was noted to have elevated creatinine which started trending up despite IV fluids.  He was more agitated and required one-to-one sitter and Ativan for agitation.  Nephrology was consulted due to concern for nivolumab induced toxicity.  IR guided biopsy was done on 12/26/2020 which showed tubular interstitial nephritis.  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  Completed a 5-day course of  Rocephin and Zithromax.    Agitation, confusion.  Intermittently requiring Ativan and one-to-one sitter.   Could not use Haldol due to history of seizures.  Continue  Ativan as needed for extreme agitation.  We will continue to hold discontinue ritalin for now.  Could start at a lower dose when started.   Metastatic melanoma-with extensive mets including brain.  S/p craniotomy and resection of left occipital and left frontal lesion in 09/2020.CT head shows stable extensive hemorrhagic mets.   Followed by Dr [name], oncology.  Was on dexamethasone.  Has been changed to prednisone  as per nephrology and oncology recommendation in view of tubulointerstitial nephritis.  Continue Keppra.    Acute kidney injury   Baseline creatinine of around 0.6-0.7.  Initially received IV fluids but his creatinine started trending up.   Bladder scan did not show any residual urine volume.  Nephrology was consulted  due to possibility of nivolumab induced toxicity.  Renal ultrasound was negative for acute findings.  Biopsy was performed by interventional radiology on 12/26/2020 with findings of acute tubulointerstitial nephritis.Marland Kitchen  Dexamethasone has been changed to prednisone.  Follow oncology and nephrology recommendation.  Creatinine has started to trend down.  With Dr. [name] nephrology.  Patient might be stable for discharge on oral prednisone and he would follow the patient as outpatient.  He was okay for discharge if his disposition was certain.  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    Elevated LFTs.  Likely secondary to metastatic disease. Hepatitis panel was nonreactive.  Latest AST 31 ALT 82-decreasing.   Hypokalemia: Replenished. Potassium of 3.8 today.  Mild hypernatremia.  Improved. Sodium  level of 141  Cognitive impairment likely from brain mets.  Alert, awake and oriented to person only,  Requiring one-to-one sitter and Ativan for agitation. Gets agitated  intermittently.  Generalized weakness/debility Continue physical therapy, occupational therapy evaluation.  Patient is currently at the skilled nursing facility level of care and awaiting for medical stabilization prior to discharge.    DVT prophylaxis: SCD/Compression stockings  Code Status: partial    Code Status Orders  (From admission, onward)         Start     Ordered   12/21/20 1059  Limited resuscitation (code)  Continuous       Question Answer Comment  In the event of cardiac or respiratory ARREST: Initiate Code Blue, Call Rapid Response Yes   In the event of cardiac or respiratory ARREST: Perform CPR Yes   In the event of cardiac or respiratory ARREST: Perform Intubation/Mechanical Ventilation No   In the event of cardiac or respiratory ARREST: Use NIPPV/BiPAp only if indicated Yes   In the event of cardiac or respiratory ARREST: Administer ACLS medications if indicated Yes   In the event of cardiac or respiratory ARREST: Perform Defibrillation or Cardioversion if indicated Yes      12/21/20 1058        Code Status History    Date Active Date Inactive Code Status Order ID Comments User Context   12/19/2020 1219 12/21/2020 1058 DNR [id number]  Mercy Riding, MD Inpatient   12/19/2020 0834 12/19/2020 1219 Full Code [id number]  Mercy Riding, MD Inpatient   10/26/2020 1508 11/09/2020 1515 Full Code [id number]  Cathlyn Parsons, PA-C Inpatient   10/26/2020 1508 10/26/2020 1508 Full Code [id number]  Cathlyn Parsons, PA-C Inpatient   10/20/2020 2106 10/26/2020 1502 Full Code [id number]  Lenore Cordia, MD ED   09/15/2020 0309 09/18/2020 2006 Full Code [id number]  Rise Patience, MD ED   Advance Care Planning Activity    Advance Directive Documentation   Flowsheet Row Most Recent Value  Type of Advance Directive Healthcare Power of Attorney  Pre-existing out of facility DNR order (yellow form or pink MOST form) --  "MOST" Form in Place? --     Family Communication: None  today Disposition Plan:   pt is stable for discharge,  Consults called: None Admission status: Inpatient   Consultants:   nephrology, oncology  Procedures:  CT Head Wo Contrast  Result Date: 12/18/2020 CLINICAL DATA:  Fever.  History of metastatic melanoma to the brain. EXAM: CT HEAD WITHOUT CONTRAST TECHNIQUE: Contiguous axial images were obtained from the base of the skull through the vertex without intravenous contrast. COMPARISON:  10/25/2020 FINDINGS: Brain: Hyperdense lesion again noted in the posterior left parietal lobe measuring 1.5 cm, stable since prior study. Numerous other hyperdense metastases noted. Encephalomalacia within the previously seen area of hemorrhage in the right basal ganglia. No new areas of hemorrhage, mass effect or midline shift. No hydrocephalus. Vascular: No hyperdense vessel or unexpected calcification. Skull: No acute calvarial abnormality. Sinuses/Orbits: No acute findings Other: None IMPRESSION: Extensive hemorrhagic metastases throughout the brain compatible with metastatic melanoma. No significant change since prior study. Electronically Signed   By: Rolm Baptise M.D.   On: 12/18/2020 18:39   CT Chest W Contrast  Result Date: 12/09/2020 CLINICAL DATA:  Metastatic melanoma, immunotherapy ongoing EXAM: CT CHEST WITH CONTRAST TECHNIQUE: Multidetector CT imaging of the chest was performed during intravenous contrast administration. CONTRAST:  71mL OMNIPAQUE IOHEXOL 300 MG/ML  SOLN COMPARISON:  11/01/2020 FINDINGS:  Cardiovascular: Heart is normal in size.  No pericardial effusion. No evidence of thoracic aortic aneurysm. Mediastinum/Nodes: No suspicious mediastinal lymphadenopathy. Small right hilar nodes measuring up to 9 mm (series 2/image 70), previously 8 mm, grossly unchanged. Visualized thyroid is unremarkable. Lungs/Pleura: Multiple bilateral pulmonary nodules/metastases, including following index lesions: --11 mm nodule in the anterior right upper lobe  (series 5/image 66), previously 12 mm --4 mm nodule in the posterior right upper lobe (series 5/image 68), previously 6 mm --5 mm nodule in the right lower lobe (series 5/image 70), previously 8 mm Mild scarring/atelectasis at the right lung base. Additional subpleural patchy opacity in the lingula and left lower lobe. No focal consolidation. No pleural effusion or pneumothorax. Upper Abdomen: Multifocal hepatic metastases, improved, including: --1.7 cm lesion in the posterior right hepatic dome (series 2/image 100), previously 2.6 cm --1.3 cm lesion in the caudate (series 2/image 131), previously 1.5 cm --1.7 cm lesion in the inferior right hepatic lobe (series 2/image 135), previously 2.2 cm Musculoskeletal: No focal osseous lesions. IMPRESSION: Improving pulmonary metastases, as above. Small right hilar nodes, grossly unchanged. Improving hepatic metastases, as above. Electronically Signed   By: Julian Hy M.D.   On: 12/09/2020 09:41   MR BRAIN W WO CONTRAST  Result Date: 12/14/2020 EXAM: MRI HEAD WITHOUT AND WITH CONTRAST TECHNIQUE: Multiplanar, multiecho pulse sequences of the brain and surrounding structures were obtained without and with intravenous contrast. CONTRAST:  52mL MULTIHANCE GADOBENATE DIMEGLUMINE 529 MG/ML IV SOLN COMPARISON:  None. FINDINGS: Brain: Interval slight decrease in size of numerous intrinsically T1 hyperintense lesions previously seen, likely largely secondary to resolving/evolving hemorrhage given signal characteristics. These lesions also demonstrate susceptibility artifact, compatible with hemorrhage. The T1 hyperintensity of these lesions limits evaluation for underlying enhancement. The T2/FLAIR hyperintense vasogenic edema surrounding the lesions is also slightly improved with decreased mass effect on the lateral ventricles. Index lesion in the left frontal lobe measures 2.8 x 2.2 by 2.1 cm (series 12, image 85), previously 3.4 x 2.7 x 2.4 cm. Index lesion in the right  basal ganglia measures 3.0 x 2.0 x 2.4 cm (series 12, image 95), previously 3.5 by 2.6 x 3.1 cm. Index lesion within the right occipital lobe measures 1.9 by 2.3 by 2.6 cm (series 12, image 75), previously 2.2 x 2.4 x 2.8 cm. Multiple new irregular/wispy areas of nonhemorrhagic enhancement in the left frontal and right parietal white matter (for example series 12, image 95 through 101 and images 105 through 109) and right frontal white matter (for example series 12, images 77 through 93). These areas do demonstrate DWI hyperintensity, but there is no convincing ADC correlate, therefore favored to represent T2 shine through. Similar postsurgical changes in the left occipital and left frontal lobes. No evidence of acute infarct. No hydrocephalus. No midline shift. Basal cisterns are patent. No extra-axial fluid collection. Vascular: Major arterial flow voids are maintained at the skull base. Skull and upper cervical spine: Prior left occipital and left frontal craniotomies. Sinuses/Orbits: Clear sinuses.  Unremarkable orbits. Other: No mastoid effusions. IMPRESSION: 1. Interval slight decrease in size of multiple hemorrhagic intracranial metastases previously seen, detailed above and likely largely secondary to resolving/evolving hemorrhage. Surrounding vasogenic edema is also slightly improved with decreased mass effect on the lateral ventricles. 2. Multiple new areas of nonhemorrhagic enhancement in bifrontal and right parietal white matter. These areas could represent new metastases, but the appearance of the irregular/wispy enhancement is nonspecific and inflammatory/treatment related change is a differential consideration. Consider short-interval followup to assess  for change. Electronically Signed   By: Margaretha Sheffield MD   On: 12/14/2020 08:50   US RENAL  Result Date: 12/26/2020 CLINICAL DATA:  55 year old male with acute renal insufficiency. Metastatic melanoma. EXAM: RENAL / URINARY TRACT ULTRASOUND  COMPLETE COMPARISON:  CT Abdomen and Pelvis 09/14/2020. FINDINGS: Right Kidney: Renal measurements: 13.3 x 6.7 x 8.8 cm = volume: 406 mL. Echogenicity within normal limits. No mass or hydronephrosis visualized. Left Kidney: Renal measurements: 13.5 x 7.3 by 6.9 cm = volume: 355 mL. Echogenicity within normal limits. No mass or hydronephrosis visualized. Bladder: Diminutive.  Appears normal for degree of bladder distention. Other: None. IMPRESSION: Normal ultrasound appearance of both kidneys and the urinary bladder. Electronically Signed   By: Genevie Ann M.D.   On: 12/26/2020 08:17   DG Chest Port 1 View  Result Date: 12/18/2020 CLINICAL DATA:  Fever EXAM: PORTABLE CHEST 1 VIEW COMPARISON:  None. FINDINGS: The heart size and mediastinal contours are within normal limits. Mildly increased hazy airspace opacity is seen the left lower lung. The visualized skeletal structures are unremarkable. IMPRESSION: Mildly hazy airspace opacity at the left lower lung which could be due to atelectasis or infectious etiology. Electronically Signed   By: Prudencio Pair M.D.   On: 12/18/2020 18:30   US BIOPSY (KIDNEY)  Result Date: 12/26/2020 INDICATION: 55 year old male with a history of melanoma metastatic to the brain and now acute kidney injury. Concern for drug related nephritis. He presents for random renal biopsy. EXAM: ULTRASOUND GUIDED RENAL BIOPSY COMPARISON:  None. MEDICATIONS: Fentanyl 50 mcg IV; Versed 4 mg IV ANESTHESIA/SEDATION: Total Moderate Sedation time 10 minutes The patient's vital signs and level of consciousness were monitored continuously by radiology nursing under my direct supervision. COMPLICATIONS: None immediate PROCEDURE: Informed written consent was obtained from the patient after a discussion of the risks, benefits and alternatives to treatment. The patient understands and consents the procedure. A timeout was performed prior to the initiation of the procedure. Ultrasound scanning was performed of the  bilateral flanks. The inferior pole of the right kidney was selected for biopsy due to location and sonographic window. The procedure was planned. The operative site was prepped and draped in the usual sterile fashion. The overlying soft tissues were anesthetized with 1% lidocaine with epinephrine. A 17 gauge core needle biopsy device was advanced into the inferior cortex of the right kidney and 2 core biopsies were obtained under direct ultrasound guidance. Images were saved for documentation purposes. The biopsy device was removed and hemostasis was obtained with manual compression. Post procedural scanning was negative for significant post procedural hemorrhage or additional complication. A dressing was placed. The patient tolerated the procedure well without immediate post procedural complication. IMPRESSION: Technically successful ultrasound guided right renal biopsy. Electronically Signed   By: Jacqulynn Cadet M.D.   On: 12/26/2020 14:14     Antimicrobials:   None currently    Subjective: Resting in bed this am,  Staff reports some episodes of getting out of bed requiring re-direction  Objective: Vitals:   12/27/20 2115 12/28/20 0541 12/28/20 2252 12/29/20 0622  BP: 106/73 116/80 101/79 119/88  Pulse: 70 86 67 68  Resp: 18 18 18 18   Temp: 98 F (36.7 C) 98.2 F (36.8 C) 98.2 F (36.8 C) 97.7 F (36.5 C)  TempSrc:      SpO2: 97% 96% 97% 96%  Weight:      Height:        Intake/Output Summary (Last 24 hours) at 12/29/2020 1248  Last data filed at 12/29/2020 1117 Gross per 24 hour  Intake 845 ml  Output --  Net 845 ml   Filed Weights   12/18/20 1709 12/18/20 2230 12/25/20 1400  Weight: 77.1 kg 79.3 kg 78.6 kg    Examination:  General exam: Appears calm and comfortable  Respiratory system: Clear to auscultation. Respiratory effort normal. Cardiovascular system: S1 & S2 heard, RRR. No JVD, murmurs, rubs, gallops or clicks. No pedal edema. Gastrointestinal system: Abdomen  is nondistended, soft and nontender. No organomegaly or masses felt. Normal bowel sounds heard. Central nervous system: Alert,  No new focal neurological deficits. Extremities: warm and well perfused Skin: No rashes, lesions or ulcers Psychiatry: Judgement and insight impaired. Mood & affect blunted in setting of metastaitc melanoma to brain    Data Reviewed: I have personally reviewed following labs and imaging studies  CBC: Recent Labs  Lab 12/23/20 0451 12/26/20 0741 12/27/20 0452 12/29/20 0432  WBC 9.0 7.7 9.2 7.3  HGB 10.8* 11.4* 11.6* 11.3*  HCT 32.5* 33.8* 35.3* 33.2*  MCV 95.0 94.9 94.9 91.5  PLT 212 255 325 PLATELET CLUMPS NOTED ON SMEAR, UNABLE TO ESTIMATE   Basic Metabolic Panel: Recent Labs  Lab 12/23/20 0451 12/25/20 0502 12/26/20 0741 12/27/20 0452 12/28/20 0438 12/29/20 0432  NA 145 138 140 141 142 141  K 3.9 3.4* 3.6 3.9 3.9 3.8  CL 116* 107 106 104 106 106  CO2 20* 23 24 25 26 26   GLUCOSE 91 100* 106* 110* 96 88  BUN 12 13 19  26* 26* 27*  CREATININE 1.50* 2.18* 2.42* 2.66* 2.39* 2.26*  CALCIUM 8.0* 8.3* 8.4* 8.7* 9.0 8.9  MG 1.7  --   --  2.0 2.0 2.1  PHOS 3.3  --  4.9* 4.0  --   --    GFR: Estimated Creatinine Clearance: 40.1 mL/min (A) (by C-G formula based on SCr of 2.26 mg/dL (H)). Liver Function Tests: Recent Labs  Lab 12/23/20 0451 12/26/20 0741 12/29/20 0432  AST 46* 50* 31  ALT 119* 110* 82*  ALKPHOS 124 115 107  BILITOT 0.2* 0.7 0.9  PROT 5.4* 5.8* 6.0*  ALBUMIN 2.4* 2.8* 2.9*   No results for input(s): LIPASE, AMYLASE in the last 168 hours. No results for input(s): AMMONIA in the last 168 hours. Coagulation Profile: Recent Labs  Lab 12/26/20 1105  INR 1.0   Cardiac Enzymes: No results for input(s): CKTOTAL, CKMB, CKMBINDEX, TROPONINI in the last 168 hours. BNP (last 3 results) No results for input(s): PROBNP in the last 8760 hours. HbA1C: No results for input(s): HGBA1C in the last 72 hours. CBG: No results for  input(s): GLUCAP in the last 168 hours. Lipid Profile: No results for input(s): CHOL, HDL, LDLCALC, TRIG, CHOLHDL, LDLDIRECT in the last 72 hours. Thyroid Function Tests: No results for input(s): TSH, T4TOTAL, FREET4, T3FREE, THYROIDAB in the last 72 hours. Anemia Panel: No results for input(s): VITAMINB12, FOLATE, FERRITIN, TIBC, IRON, RETICCTPCT in the last 72 hours. Sepsis Labs: No results for input(s): PROCALCITON, LATICACIDVEN in the last 168 hours.  Recent Results (from the past 240 hour(s))  MRSA PCR Screening     Status: None   Collection Time: 12/24/20 10:07 AM   Specimen: Nasal Mucosa; Nasopharyngeal  Result Value Ref Range Status   MRSA by PCR NEGATIVE NEGATIVE Final    Comment:        The GeneXpert MRSA Assay (FDA approved for NASAL specimens only), is one component of a comprehensive MRSA colonization surveillance program. It  is not intended to diagnose MRSA infection nor to guide or monitor treatment for MRSA infections. Performed at [facility], [place] [address] [place], [place] [address]          Radiology Studies: No results found.      Scheduled Meds: . citalopram  10 mg Oral QHS  . famotidine  20 mg Oral Daily  . feeding supplement  237 mL Oral TID BM  . levETIRAcetam  500 mg Oral BID  . multivitamin with minerals  1 tablet Oral Daily  . predniSONE  40 mg Oral Q breakfast  . senna-docusate  2 tablet Oral QHS   Continuous Infusions:   LOS: 10 days    Time spent: 35 min    Nicolette Bang, MD [facility]  If 7PM-7AM, please contact night-coverage  12/29/2020, 12:48 PM

## 2020-12-29 NOTE — Progress Notes (Signed)
[facility] Progress Note    Assessment/ Plan:   AKI secondary to acute tubulointerstitial nephritis (biopsy proven): -Baseline Cr is around 0.7-0.9 -renal u/s 4/12:  No acute abnormalities/obstruction -UA w/ microscopy 4/11: 11-20 WBC's but rare bacteria -s/p native kidney 12/26/20: acute tubulointerstitial nephritis which is very acute, no chronicity (this is reversible). Likely hypersensitivity initiated by drug therapy (this is a preliminary report, will upload it to the media tab). Final report is pending. -Cr stable/improved this AM -Rather difficult situation, do not want to blunt his immune response given that he has responded to nivolumab. Rather than starting at the traditional 1mg /kg dosing for prednisone, started at a lower dose: prednisone 40mg  daily (started 4/12). Decadron d/c'ed. Prednisone taper plan dependent on biopy results and response to treatment. Overall, I am hoping that we can taper him within 4 weeks down to his baseline regimen of decadron 2mg  daily but time will dictate this. Tentative plan is to taper down to 20mg  next week provided his labs have improved, have contacted our office to set him up for an appointment next week -Continue to monitor daily Cr, Dose meds for GFR<15 -Monitor Daily I/Os, Daily weight, daily labs  -Maintain MAP>65 for optimal renal perfusion.  -Avoid further nephrotoxins including NSAIDS, Morphine.  Unless absolutely necessary, avoid CT with contrast and/or MRI with gadolinium.     Severe Sepsis secondary to suspected CAP -s/p 5 day course of rocephin and zithromax. Remains afebrile  Relative hypotension -post biopsy, likely secondary to sedation, resolved, normotensive now  Metastatic melanoma -multiorgan involvement, receiving nivolumab, s/p cycle 4 on 12/18/20. Onc on board, appreciate recommendations. Case discussed with Dr. [name].  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  Sean Quint, MD [facility]  Subjective:   No acute events. Awake and alert but still intermittently restless. Offers no complaints today   Objective:   BP 119/88 (BP Location: Left Arm)   Pulse 68   Temp 97.7 F (36.5 C)   Resp 18   Ht 6' (1.829 m)   Wt 78.6 kg   SpO2 96%   BMI 23.50 kg/m   Intake/Output Summary (Last 24 hours) at 12/29/2020 1354 Last data filed at 12/29/2020 1117 Gross per 24 hour  Intake 605 ml  Output --  Net 605 ml   Weight change:   Physical Exam: Gen:nad, laying flat in bed with no clothes on, restless HEENT: MMM CVS:s1s2, rrr Resp:cta bl, no w/r/r/c, unlabored PJK:DTOI, nt/nd Ext:no edema Neuro: awake, alert, following commands, restless, moves all ext spontaneously, speech clear and coherent  Imaging: No results found.  Labs: BMET Recent Labs  Lab 12/23/20 0451 12/25/20 0502 12/26/20 0741 12/27/20 0452 12/28/20 0438 12/29/20 0432  NA 145 138 140 141 142 141  K 3.9 3.4* 3.6 3.9 3.9 3.8  CL 116* 107 106 104 106 106  CO2 20* 23 24 25 26 26   GLUCOSE 91 100* 106* 110* 96 88  BUN 12 13 19  26* 26* 27*  CREATININE 1.50* 2.18* 2.42* 2.66* 2.39* 2.26*  CALCIUM 8.0* 8.3* 8.4* 8.7* 9.0 8.9  PHOS 3.3  --  4.9* 4.0  --   --  CBC Recent Labs  Lab 12/23/20 0451 12/26/20 0741 12/27/20 0452 12/29/20 0432  WBC 9.0 7.7 9.2 7.3  HGB 10.8* 11.4* 11.6* 11.3*  HCT 32.5* 33.8* 35.3* 33.2*  MCV 95.0 94.9 94.9 91.5  PLT 212 255 325 PLATELET CLUMPS NOTED ON SMEAR, UNABLE TO ESTIMATE    Medications:    . citalopram  10 mg Oral QHS  . famotidine  20 mg Oral Daily   . feeding supplement  237 mL Oral TID BM  . levETIRAcetam  500 mg Oral BID  . multivitamin with minerals  1 tablet Oral Daily  . predniSONE  40 mg Oral Q breakfast  . senna-docusate  2 tablet Oral QHS      Sean Quint, MD [facility] 12/29/2020, 1:54 PM

## 2020-12-30 DIAGNOSIS — N10 Acute tubulo-interstitial nephritis: Secondary | ICD-10-CM | POA: Diagnosis not present

## 2020-12-30 DIAGNOSIS — C7931 Secondary malignant neoplasm of brain: Secondary | ICD-10-CM | POA: Diagnosis not present

## 2020-12-30 DIAGNOSIS — J189 Pneumonia, unspecified organism: Secondary | ICD-10-CM | POA: Diagnosis not present

## 2020-12-30 DIAGNOSIS — A419 Sepsis, unspecified organism: Secondary | ICD-10-CM | POA: Diagnosis not present

## 2020-12-30 LAB — RENAL FUNCTION PANEL
Albumin: 3.1 g/dL — ABNORMAL LOW (ref 3.5–5.0)
Anion gap: 10 (ref 5–15)
BUN: 29 mg/dL — ABNORMAL HIGH (ref 6–20)
CO2: 24 mmol/L (ref 22–32)
Calcium: 8.8 mg/dL — ABNORMAL LOW (ref 8.9–10.3)
Chloride: 103 mmol/L (ref 98–111)
Creatinine, Ser: 1.86 mg/dL — ABNORMAL HIGH (ref 0.61–1.24)
GFR, Estimated: 42 mL/min — ABNORMAL LOW (ref >60)
Glucose, Bld: 98 mg/dL (ref 70–99)
Phosphorus: 4.4 mg/dL (ref 2.5–4.6)
Potassium: 3.9 mmol/L (ref 3.5–5.1)
Sodium: 137 mmol/L (ref 135–145)

## 2020-12-30 MED ORDER — ACETAMINOPHEN 325 MG PO TABS
650.0000 mg | ORAL_TABLET | Freq: Four times a day (QID) | ORAL | Status: DC | PRN
  Administered 2020-12-30 – 2021-01-02 (×3): 650 mg via ORAL
  Filled 2020-12-30 (×3): qty 2

## 2020-12-30 NOTE — Plan of Care (Signed)
  Problem: Education: Goal: Knowledge of General Education information will improve Description: Including pain rating scale, medication(s)/side effects and non-pharmacologic comfort measures Outcome: Progressing   Problem: Activity: Goal: Risk for activity intolerance will decrease Outcome: Progressing   Problem: Nutrition: Goal: Adequate nutrition will be maintained Outcome: Progressing   

## 2020-12-30 NOTE — Progress Notes (Signed)
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 Patient does have baseline cognitive dysfunction due to metastatic brain disease and is only oriented to self and place. History was obtained from the patient's family who reported a fever of 101.4 F with increased breathing. In the ED patient was noted to be slightly tachycardic and had mild hypotension. Laboratory data showed normal leukocyte count but lactate was elevated at 2.4 which trended up to 2.7. COVID-19 and influenza test was negative. Chest x-ray was reported as left lower lobe airspace opacity. In the ED, CT head was performed which showed stable but extensive hemorrhagic metastasis. Patient received 2 L of lactated ringer, Rocephin and Zithromax and was admitted to hospital for further evaluation and treatment.  During hospitalization, patient was noted to have elevated creatinine which started trending up despite IV fluids. He was more agitated and required one-to-one sitter and Ativan for agitation. Nephrology was consulted due to concern for nivolumab induced toxicity.IR guided biopsy was done on 12/26/2020 which showed tubular interstitial nephritis. 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   Agitation, confusion.Intermittently requiring Ativan and one-to-one sitter.Could not use Haldol due to history of seizures. Continue Ativan as needed for extreme agitation.We will continue to hold discontinue ritalin for now.Could start at a lower dose when started.   Metastatic melanoma-with extensive mets including brain.  S/p craniotomy and resection of left occipital and left frontal lesion in 09/2020.CT head shows stable extensive hemorrhagic mets. Followed by Dr [name], oncology. Was on dexamethasone. Has been changed to prednisone as per nephrology and oncology recommendation in view of tubulointerstitialnephritis. Continue Keppra.   Acute kidney injury  Baseline creatinine of around 0.6-0.7. Initially received IV fluids but his creatinine started trending up. Bladder scan did not show any residual urine volume. Nephrology was consulted due to possibility of nivolumab induced toxicity. Renal ultrasound was negative for acute findings. Biopsy was performed by interventional radiology on 4/12/2022with findings of acute tubulointerstitial nephritis.Marland KitchenDexamethasone has been changed to prednisone. Follow oncology and nephrology recommendation. Creatinine has started to trend down.With Dr. [name] nephrology. Patient might be stable for discharge on oral prednisone and he would follow the patient as outpatient. He was okay for discharge if his disposition was certain.  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   Elevated LFTs. Likely secondary to metastatic disease. Hepatitis panel was nonreactive.Latest AST 31 ALT 82-decreasing.   Hypokalemia:Replenished. Potassium of 3.8 today.  Mild hypernatremia.Improved. Sodium level of 141  Cognitive impairment likely from brain mets.  Alert, awake and oriented to person only, Requiring one-to-one sitter and Ativan for agitation. Gets agitated  intermittently.  Generalized weakness/debility Continue physical therapy, occupational therapy evaluation. Patient is currently at the skilled nursing facilitylevel of care and awaiting for medical stabilization prior to discharge.   DVT  prophylaxis: SCD/Compression stockings  Code Status: Partial    Code Status Orders  (From admission, onward)         Start     Ordered   12/21/20 1059  Limited resuscitation (code)  Continuous       Question Answer Comment  In the event of cardiac or respiratory ARREST: Initiate Code Blue, Call Rapid Response Yes   In the event of cardiac or respiratory ARREST: Perform CPR Yes   In the event of cardiac or respiratory ARREST: Perform Intubation/Mechanical Ventilation No   In the event of cardiac or respiratory ARREST: Use NIPPV/BiPAp only if indicated Yes   In the event of cardiac or respiratory ARREST: Administer ACLS medications if indicated Yes   In the event of cardiac or respiratory ARREST: Perform Defibrillation or Cardioversion if indicated Yes      12/21/20 1058        Code Status History    Date Active Date Inactive Code Status Order ID Comments User Context   12/19/2020 1219 12/21/2020 1058 DNR [id number]  Mercy Riding, MD Inpatient   12/19/2020 0834 12/19/2020 1219 Full Code [id number]  Mercy Riding, MD Inpatient   10/26/2020 1508 11/09/2020 1515 Full Code [id number]  Cathlyn Parsons, PA-C Inpatient   10/26/2020 1508 10/26/2020 1508 Full Code [id number]  Cathlyn Parsons, PA-C Inpatient   10/20/2020 2106 10/26/2020 1502 Full Code [id number]  Lenore Cordia, MD ED   09/15/2020 0309 09/18/2020 2006 Full Code [id number]  Rise Patience, MD ED   Advance Care Planning Activity    Advance Directive Documentation   Flowsheet Row Most Recent Value  Type of Advance Directive Healthcare Power of Attorney  Pre-existing out of facility DNR order (yellow form or pink MOST form) --  "MOST" Form in Place? --     Family Communication: spoke with  wife, answered all question, she did inquire about restartign ritalin Disposition Plan:   Stable for discharge awaiting insurance authorization and placement. Consults called: None Admission status: Inpatient   Consultants:   Nephrology, oncology  Procedures:  CT Head Wo Contrast  Result Date: 12/18/2020 CLINICAL DATA:  Fever.  History of metastatic melanoma to the brain. EXAM: CT HEAD WITHOUT CONTRAST TECHNIQUE: Contiguous axial images were obtained from the base of the skull through the vertex without intravenous contrast. COMPARISON:  10/25/2020 FINDINGS: Brain: Hyperdense lesion again noted in the posterior left parietal lobe measuring 1.5 cm, stable since prior study. Numerous other hyperdense metastases noted. Encephalomalacia within the previously seen area of hemorrhage in the right basal ganglia. No new areas of hemorrhage, mass effect or midline shift. No hydrocephalus. Vascular: No hyperdense vessel or unexpected calcification. Skull: No acute calvarial abnormality. Sinuses/Orbits: No acute findings Other: None IMPRESSION: Extensive hemorrhagic metastases throughout the brain compatible with metastatic melanoma. No significant change since prior study. Electronically Signed   By: Rolm Baptise M.D.   On: 12/18/2020 18:39   CT Chest W Contrast  Result Date: 12/09/2020 CLINICAL DATA:  Metastatic melanoma, immunotherapy ongoing EXAM: CT CHEST WITH CONTRAST TECHNIQUE: Multidetector CT imaging of the chest was performed during intravenous contrast administration. CONTRAST:  48mL OMNIPAQUE IOHEXOL 300 MG/ML  SOLN COMPARISON:  11/01/2020 FINDINGS: Cardiovascular: Heart is normal in size.  No pericardial effusion. No evidence of thoracic aortic aneurysm. Mediastinum/Nodes: No suspicious mediastinal lymphadenopathy. Small right hilar nodes measuring up to 9 mm (series 2/image 70), previously 8 mm, grossly unchanged. Visualized thyroid is unremarkable. Lungs/Pleura: Multiple bilateral pulmonary  nodules/metastases, including following index  lesions: --11 mm nodule in the anterior right upper lobe (series 5/image 66), previously 12 mm --4 mm nodule in the posterior right upper lobe (series 5/image 68), previously 6 mm --5 mm nodule in the right lower lobe (series 5/image 70), previously 8 mm Mild scarring/atelectasis at the right lung base. Additional subpleural patchy opacity in the lingula and left lower lobe. No focal consolidation. No pleural effusion or pneumothorax. Upper Abdomen: Multifocal hepatic metastases, improved, including: --1.7 cm lesion in the posterior right hepatic dome (series 2/image 100), previously 2.6 cm --1.3 cm lesion in the caudate (series 2/image 131), previously 1.5 cm --1.7 cm lesion in the inferior right hepatic lobe (series 2/image 135), previously 2.2 cm Musculoskeletal: No focal osseous lesions. IMPRESSION: Improving pulmonary metastases, as above. Small right hilar nodes, grossly unchanged. Improving hepatic metastases, as above. Electronically Signed   By: Julian Hy M.D.   On: 12/09/2020 09:41   MR BRAIN W WO CONTRAST  Result Date: 12/14/2020 EXAM: MRI HEAD WITHOUT AND WITH CONTRAST TECHNIQUE: Multiplanar, multiecho pulse sequences of the brain and surrounding structures were obtained without and with intravenous contrast. CONTRAST:  57mL MULTIHANCE GADOBENATE DIMEGLUMINE 529 MG/ML IV SOLN COMPARISON:  None. FINDINGS: Brain: Interval slight decrease in size of numerous intrinsically T1 hyperintense lesions previously seen, likely largely secondary to resolving/evolving hemorrhage given signal characteristics. These lesions also demonstrate susceptibility artifact, compatible with hemorrhage. The T1 hyperintensity of these lesions limits evaluation for underlying enhancement. The T2/FLAIR hyperintense vasogenic edema surrounding the lesions is also slightly improved with decreased mass effect on the lateral ventricles. Index lesion in the left frontal lobe  measures 2.8 x 2.2 by 2.1 cm (series 12, image 85), previously 3.4 x 2.7 x 2.4 cm. Index lesion in the right basal ganglia measures 3.0 x 2.0 x 2.4 cm (series 12, image 95), previously 3.5 by 2.6 x 3.1 cm. Index lesion within the right occipital lobe measures 1.9 by 2.3 by 2.6 cm (series 12, image 75), previously 2.2 x 2.4 x 2.8 cm. Multiple new irregular/wispy areas of nonhemorrhagic enhancement in the left frontal and right parietal white matter (for example series 12, image 95 through 101 and images 105 through 109) and right frontal white matter (for example series 12, images 77 through 93). These areas do demonstrate DWI hyperintensity, but there is no convincing ADC correlate, therefore favored to represent T2 shine through. Similar postsurgical changes in the left occipital and left frontal lobes. No evidence of acute infarct. No hydrocephalus. No midline shift. Basal cisterns are patent. No extra-axial fluid collection. Vascular: Major arterial flow voids are maintained at the skull base. Skull and upper cervical spine: Prior left occipital and left frontal craniotomies. Sinuses/Orbits: Clear sinuses.  Unremarkable orbits. Other: No mastoid effusions. IMPRESSION: 1. Interval slight decrease in size of multiple hemorrhagic intracranial metastases previously seen, detailed above and likely largely secondary to resolving/evolving hemorrhage. Surrounding vasogenic edema is also slightly improved with decreased mass effect on the lateral ventricles. 2. Multiple new areas of nonhemorrhagic enhancement in bifrontal and right parietal white matter. These areas could represent new metastases, but the appearance of the irregular/wispy enhancement is nonspecific and inflammatory/treatment related change is a differential consideration. Consider short-interval followup to assess for change. Electronically Signed   By: Margaretha Sheffield MD   On: 12/14/2020 08:50   US RENAL  Result Date: 12/26/2020 CLINICAL DATA:   55 year old male with acute renal insufficiency. Metastatic melanoma. EXAM: RENAL / URINARY TRACT ULTRASOUND COMPLETE COMPARISON:  CT Abdomen and Pelvis 09/14/2020. FINDINGS:  Right Kidney: Renal measurements: 13.3 x 6.7 x 8.8 cm = volume: 406 mL. Echogenicity within normal limits. No mass or hydronephrosis visualized. Left Kidney: Renal measurements: 13.5 x 7.3 by 6.9 cm = volume: 355 mL. Echogenicity within normal limits. No mass or hydronephrosis visualized. Bladder: Diminutive.  Appears normal for degree of bladder distention. Other: None. IMPRESSION: Normal ultrasound appearance of both kidneys and the urinary bladder. Electronically Signed   By: Genevie Ann M.D.   On: 12/26/2020 08:17   DG Chest Port 1 View  Result Date: 12/18/2020 CLINICAL DATA:  Fever EXAM: PORTABLE CHEST 1 VIEW COMPARISON:  None. FINDINGS: The heart size and mediastinal contours are within normal limits. Mildly increased hazy airspace opacity is seen the left lower lung. The visualized skeletal structures are unremarkable. IMPRESSION: Mildly hazy airspace opacity at the left lower lung which could be due to atelectasis or infectious etiology. Electronically Signed   By: Prudencio Pair M.D.   On: 12/18/2020 18:30   US BIOPSY (KIDNEY)  Result Date: 12/26/2020 INDICATION: 55 year old male with a history of melanoma metastatic to the brain and now acute kidney injury. Concern for drug related nephritis. He presents for random renal biopsy. EXAM: ULTRASOUND GUIDED RENAL BIOPSY COMPARISON:  None. MEDICATIONS: Fentanyl 50 mcg IV; Versed 4 mg IV ANESTHESIA/SEDATION: Total Moderate Sedation time 10 minutes The patient's vital signs and level of consciousness were monitored continuously by radiology nursing under my direct supervision. COMPLICATIONS: None immediate PROCEDURE: Informed written consent was obtained from the patient after a discussion of the risks, benefits and alternatives to treatment. The patient understands and consents the  procedure. A timeout was performed prior to the initiation of the procedure. Ultrasound scanning was performed of the bilateral flanks. The inferior pole of the right kidney was selected for biopsy due to location and sonographic window. The procedure was planned. The operative site was prepped and draped in the usual sterile fashion. The overlying soft tissues were anesthetized with 1% lidocaine with epinephrine. A 17 gauge core needle biopsy device was advanced into the inferior cortex of the right kidney and 2 core biopsies were obtained under direct ultrasound guidance. Images were saved for documentation purposes. The biopsy device was removed and hemostasis was obtained with manual compression. Post procedural scanning was negative for significant post procedural hemorrhage or additional complication. A dressing was placed. The patient tolerated the procedure well without immediate post procedural complication. IMPRESSION: Technically successful ultrasound guided right renal biopsy. Electronically Signed   By: Jacqulynn Cadet M.D.   On: 12/26/2020 14:14     Antimicrobials:   None currently   Subjective: Some mild confusion last night requiring Ativan Otherwise resting comfortably in bed  Objective: Vitals:   12/28/20 2252 12/29/20 0622 12/29/20 2138 12/30/20 0644  BP: 101/79 119/88 123/78 110/67  Pulse: 67 68 80 76  Resp: 18 18 18 18   Temp: 98.2 F (36.8 C) 97.7 F (36.5 C) 98.5 F (36.9 C) 98.2 F (36.8 C)  TempSrc:   Axillary Oral  SpO2: 97% 96% 99% 97%  Weight:      Height:        Intake/Output Summary (Last 24 hours) at 12/30/2020 1145 Last data filed at 12/30/2020 0800 Gross per 24 hour  Intake 350 ml  Output 100 ml  Net 250 ml   Filed Weights   12/18/20 1709 12/18/20 2230 12/25/20 1400  Weight: 77.1 kg 79.3 kg 78.6 kg    Examination:   General exam: Appears calm and comfortable, no agitation noted  Respiratory system: Clear to auscultation. Respiratory effort  normal. Cardiovascular system: S1 & S2 heard, RRR. No JVD, murmurs, rubs, gallops or clicks. No pedal edema. Gastrointestinal system: Abdomen is nondistended, soft and nontender. No organomegaly or masses felt. Normal bowel sounds heard. Central nervous system: Alert,  No new focal neurological deficits. Extremities: warm and well perfused Skin: No rashes, lesions or ulcers Psychiatry: Judgement and insight impaired. Mood & affect blunted in setting of metastaitc melanoma to brain.     Data Reviewed: I have personally reviewed following labs and imaging studies  CBC: Recent Labs  Lab 12/26/20 0741 12/27/20 0452 12/29/20 0432  WBC 7.7 9.2 7.3  HGB 11.4* 11.6* 11.3*  HCT 33.8* 35.3* 33.2*  MCV 94.9 94.9 91.5  PLT 255 325 PLATELET CLUMPS NOTED ON SMEAR, UNABLE TO ESTIMATE   Basic Metabolic Panel: Recent Labs  Lab 12/26/20 0741 12/27/20 0452 12/28/20 0438 12/29/20 0432 12/30/20 0643  NA 140 141 142 141 137  K 3.6 3.9 3.9 3.8 3.9  CL 106 104 106 106 103  CO2 24 25 26 26 24   GLUCOSE 106* 110* 96 88 98  BUN 19 26* 26* 27* 29*  CREATININE 2.42* 2.66* 2.39* 2.26* 1.86*  CALCIUM 8.4* 8.7* 9.0 8.9 8.8*  MG  --  2.0 2.0 2.1  --   PHOS 4.9* 4.0  --   --  4.4   GFR: Estimated Creatinine Clearance: 48.7 mL/min (A) (by C-G formula based on SCr of 1.86 mg/dL (H)). Liver Function Tests: Recent Labs  Lab 12/26/20 0741 12/29/20 0432 12/30/20 0643  AST 50* 31  --   ALT 110* 82*  --   ALKPHOS 115 107  --   BILITOT 0.7 0.9  --   PROT 5.8* 6.0*  --   ALBUMIN 2.8* 2.9* 3.1*   No results for input(s): LIPASE, AMYLASE in the last 168 hours. No results for input(s): AMMONIA in the last 168 hours. Coagulation Profile: Recent Labs  Lab 12/26/20 1105  INR 1.0   Cardiac Enzymes: No results for input(s): CKTOTAL, CKMB, CKMBINDEX, TROPONINI in the last 168 hours. BNP (last 3 results) No results for input(s): PROBNP in the last 8760 hours. HbA1C: No results for input(s): HGBA1C in  the last 72 hours. CBG: No results for input(s): GLUCAP in the last 168 hours. Lipid Profile: No results for input(s): CHOL, HDL, LDLCALC, TRIG, CHOLHDL, LDLDIRECT in the last 72 hours. Thyroid Function Tests: No results for input(s): TSH, T4TOTAL, FREET4, T3FREE, THYROIDAB in the last 72 hours. Anemia Panel: No results for input(s): VITAMINB12, FOLATE, FERRITIN, TIBC, IRON, RETICCTPCT in the last 72 hours. Sepsis Labs: No results for input(s): PROCALCITON, LATICACIDVEN in the last 168 hours.  Recent Results (from the past 240 hour(s))  MRSA PCR Screening     Status: None   Collection Time: 12/24/20 10:07 AM   Specimen: Nasal Mucosa; Nasopharyngeal  Result Value Ref Range Status   MRSA by PCR NEGATIVE NEGATIVE Final    Comment:        The GeneXpert MRSA Assay (FDA approved for NASAL specimens only), is one component of a comprehensive MRSA colonization surveillance program. It is not intended to diagnose MRSA infection nor to guide or monitor treatment for MRSA infections. Performed at [facility], [place] [address], [place], [place] [address]          Radiology Studies: No results found.      Scheduled Meds: . citalopram  10 mg Oral QHS  . famotidine  20 mg  Oral Daily  . feeding supplement  237 mL Oral TID BM  . levETIRAcetam  500 mg Oral BID  . multivitamin with minerals  1 tablet Oral Daily  . predniSONE  40 mg Oral Q breakfast  . senna-docusate  2 tablet Oral QHS   Continuous Infusions:   LOS: 11 days    Time spent: 35 min    Nicolette Bang, MD [facility]  If 7PM-7AM, please contact night-coverage  12/30/2020, 11:45 AM

## 2020-12-30 NOTE — TOC Progression Note (Signed)
Transition of Care [facility]) - Progression Note    Patient Details  Name: Sean [name] MRN: [id number] Date of Birth: [birth year]  Transition [facility] CM/SW Contact  Joaquin Courts, RN Phone Number: 12/30/2020, 10:35 AM  Clinical Narrative:    CM followed up with CP rep Teena re status of authorization, unable to reach by phone, VM left.  Per last update, authorization is still pending.  Given that this is a holiday weekend and insurance company is closed, I do not anticipate insurance approval over the weekend.   Expected Discharge Plan: Skilled Nursing Facility Barriers to Discharge: Ship broker  Expected Discharge Plan and Services Expected Discharge Plan: [place]   Discharge Planning Services: CM Consult Post Acute Care Choice: Resumption of Svcs/PTA Provider Living arrangements for the past 2 months: Single Family Home,Skilled Nursing Facility                                       Social Determinants of Health (SDOH) Interventions    Readmission Risk Interventions No flowsheet data found.

## 2020-12-30 NOTE — Progress Notes (Signed)
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 Likely hypersensitivity initiated by drug therapy (this is a preliminary report, uploaded to the media tab). Final report is pending. -Cr continues to improve -Rather difficult situation, do not want to blunt his immune response given that he has responded to nivolumab. Rather than starting at the traditional 1mg /kg dosing for prednisone, started at a lower dose: prednisone 40mg  daily (started 4/12). Decadron d/c'ed. Prednisone taper plan dependent on response to treatment. Overall, I am hoping that we can taper him within 4 weeks down to his baseline regimen of decadron 2mg  daily but time will dictate this. Tentative plan is to taper down to 20mg  this coming week provided his labs continue to improve, have contacted our office to set him up for an appointment next week -Continue to monitor daily Cr, Dose meds for GFR<15 -Monitor Daily I/Os, Daily weight, daily labs  -Maintain MAP>65 for optimal renal perfusion.  -Avoid further nephrotoxins including NSAIDS, Morphine.  Unless absolutely necessary, avoid CT with contrast and/or MRI with gadolinium.     Severe Sepsis secondary to suspected CAP -s/p 5 day course of rocephin and zithromax. Remains afebrile  Metastatic melanoma -multiorgan involvement, receiving nivolumab, s/p cycle 4 on 12/18/20. Onc on board, appreciate recommendations. Case had been discussed with Dr. [name].  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 Monitor daily labs, continue with prednisone. Currently, he is okay for discharge from a nephrology perspective. Message sent to our office for follow up. Thank you for allowing [place] to participate in the care of this patient. Please call once he is closer to discharge to confirm his appointment date and/or with any questions/concerns. Updated [name] (wife) over the phone, she agrees with the plan. Dispo: SNF  Gean Quint, MD [facility]  Subjective:   No acute events. Still requires 1:1 due to restlessness/agitation  Cr down to 1.9 Objective:   BP 110/67 (BP Location: Left Arm)   Pulse 76   Temp 98.2 F (36.8 C) (Oral)   Resp 18   Ht 6' (1.829 m)   Wt 78.6 kg   SpO2 97%   BMI 23.50 kg/m   Intake/Output Summary (Last 24 hours) at 12/30/2020 1051 Last data filed at 12/30/2020 0800 Gross per 24 hour  Intake 470 ml  Output 100 ml  Net 370 ml   Weight change:   Physical Exam: Gen:nad, laying flat in bed, restless HEENT: MMM CVS:s1s2, rrr Resp:cta bl, no w/r/r/c, unlabored KZL:DJTT, nt/nd Ext:no edema Neuro: awake, alert, following commands, restless, moves all ext spontaneously, speech clear and coherent  Imaging: No results found.  Labs: BMET Recent Labs  Lab 12/25/20 0502 12/26/20 0741 12/27/20 0452 12/28/20 0438 12/29/20 0432 12/30/20 0643  NA 138 140 141 142 141 137  K 3.4* 3.6 3.9 3.9 3.8 3.9  CL 107 106 104 106 106 103  CO2 23 24 25 26 26 24   GLUCOSE 100* 106* 110* 96 88 98  BUN 13 19  26* 26* 27* 29*  CREATININE 2.18* 2.42* 2.66* 2.39* 2.26* 1.86*  CALCIUM 8.3* 8.4* 8.7* 9.0 8.9 8.8*  PHOS  --  4.9* 4.0  --   --  4.4   CBC Recent Labs  Lab 12/26/20 0741 12/27/20 0452 12/29/20 0432  WBC 7.7 9.2 7.3  HGB 11.4* 11.6* 11.3*  HCT 33.8* 35.3* 33.2*  MCV 94.9 94.9 91.5  PLT 255 325 PLATELET  CLUMPS NOTED ON SMEAR, UNABLE TO ESTIMATE    Medications:    . citalopram  10 mg Oral QHS  . famotidine  20 mg Oral Daily  . feeding supplement  237 mL Oral TID BM  . levETIRAcetam  500 mg Oral BID  . multivitamin with minerals  1 tablet Oral Daily  . predniSONE  40 mg Oral Q breakfast  . senna-docusate  2 tablet Oral QHS      Gean Quint, MD [facility] 12/30/2020, 10:51 AM

## 2020-12-31 DIAGNOSIS — N10 Acute tubulo-interstitial nephritis: Secondary | ICD-10-CM | POA: Diagnosis not present

## 2020-12-31 DIAGNOSIS — C7931 Secondary malignant neoplasm of brain: Secondary | ICD-10-CM | POA: Diagnosis not present

## 2020-12-31 DIAGNOSIS — J189 Pneumonia, unspecified organism: Secondary | ICD-10-CM | POA: Diagnosis not present

## 2020-12-31 DIAGNOSIS — A419 Sepsis, unspecified organism: Secondary | ICD-10-CM | POA: Diagnosis not present

## 2020-12-31 NOTE — Plan of Care (Signed)
  Problem: Education: Goal: Knowledge of General Education information will improve Description: Including pain rating scale, medication(s)/side effects and non-pharmacologic comfort measures Outcome: Progressing   Problem: Safety: Goal: Ability to remain free from injury will improve Outcome: Progressing   

## 2020-12-31 NOTE — Progress Notes (Signed)
PROGRESS NOTE    Sean [name]  UEA:[id number] DOB: [birth year] DOA: 12/18/2020 PCP: [name], MD   Brief Narrative:  Sean [name] a 55 y.o.malewith history ofmetastatic melanoma with metastasis to multiple organs including brain, seizure secondary to brain mets, cognitive impairment, craniotomy and debility was brought into the hospital after an episode of fever during infusion. Patient does have baseline cognitive dysfunction due to metastatic brain disease and is only oriented to self and place. History was obtained from the patient's family who reported a fever of 101.4 F with increased breathing. In the ED patient was noted to be slightly tachycardic and had mild hypotension. Laboratory data showed normal leukocyte count but lactate was elevated at 2.4 which trended up to 2.7. COVID-19 and influenza test was negative. Chest x-ray was reported as left lower lobe airspace opacity. In the ED, CT head was performed which showed stable but extensive hemorrhagic metastasis. Patient received 2 L of lactated ringer, Rocephin and Zithromax and was admitted to hospital for further evaluation and treatment.  During hospitalization, patient was noted to have elevated creatinine which started trending up despite IV fluids. He was more agitated and required one-to-one sitter and Ativan for agitation. Nephrology was consulted due to concern for nivolumab induced toxicity.IR guided biopsy was done on 12/26/2020 which showed tubular interstitial nephritis. 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   Agitation, confusion.Intermittently requiring Ativan and one-to-one sitter.Could not use Haldol due to history of seizures. Continue Ativan as needed for extreme agitation.We will continue to hold discontinue ritalin for now.Could start at a lower dose when started.   Metastatic melanoma-with extensive mets including brain.  S/p craniotomy and resection of left occipital and left frontal lesion in 09/2020.CT head shows stable extensive hemorrhagic mets. Followed by Dr [name], oncology. Was on dexamethasone. Has been changed to prednisone as per nephrology and oncology recommendation in view of tubulointerstitialnephritis. Continue Keppra.   Acute kidney injury  Baseline creatinine of around 0.6-0.7. Initially received IV fluids but his creatinine started trending up. Bladder scan did not show any residual urine volume. Nephrology was consulted due to possibility of nivolumab induced toxicity. Renal ultrasound was negative for acute findings. Biopsy was performed by interventional radiology on 4/12/2022with findings of acute tubulointerstitial nephritis.Marland KitchenDexamethasone has been changed to prednisone. Follow oncology and nephrology recommendation. Creatinine has started to trend down.With Dr. [name] nephrology. Patient might be stable for discharge on oral prednisone and he would follow the patient as outpatient. He was okay for discharge if his disposition was certain.  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   Elevated LFTs. Likely secondary to metastatic disease. Hepatitis panel was nonreactive.Latest AST31ALT 82-decreasing.   Hypokalemia:Replenished. Potassium of 3.8today.  Mild hypernatremia.Improved. Sodium level of 141  Cognitive impairment likely from brain mets.  Alert, awake and oriented to person only, Requiring one-to-one sitter and Ativan for agitation. Gets agitated  intermittently.  Generalized weakness/debility Continue physical therapy, occupational therapy evaluation. Patient is currently at the skilled nursing facilitylevel of care and awaiting for medical stabilization prior to discharge.  DVT prophylaxis: SCD/Compression stockings  Code Status: partial    Code Status Orders  (From admission, onward)         Start     Ordered   12/21/20 1059  Limited resuscitation (code)  Continuous       Question Answer Comment  In the event of cardiac or respiratory ARREST: Initiate Code Blue, Call Rapid Response Yes   In the event of cardiac or respiratory ARREST: Perform CPR Yes   In the event of cardiac or respiratory ARREST: Perform Intubation/Mechanical Ventilation No   In the event of cardiac or respiratory ARREST: Use NIPPV/BiPAp only if indicated Yes   In the event of cardiac or respiratory ARREST: Administer ACLS medications if indicated Yes   In the event of cardiac or respiratory ARREST: Perform Defibrillation or Cardioversion if indicated Yes      12/21/20 1058        Code Status History    Date Active Date Inactive Code Status Order ID Comments User Context   12/19/2020 1219 12/21/2020 1058 DNR [id number]  Mercy Riding, MD Inpatient   12/19/2020 0834 12/19/2020 1219 Full Code [id number]  Mercy Riding, MD Inpatient   10/26/2020 1508 11/09/2020 1515 Full Code [id number]  Cathlyn Parsons, PA-C Inpatient   10/26/2020 1508 10/26/2020 1508 Full Code [id number]  Cathlyn Parsons, PA-C Inpatient   10/20/2020 2106 10/26/2020 1502 Full Code [id number]  Lenore Cordia, MD ED   09/15/2020 0309 09/18/2020 2006 Full Code [id number]  Rise Patience, MD ED   Advance Care Planning Activity    Advance Directive Documentation   Flowsheet Row Most Recent Value  Type of Advance Directive Healthcare Power of Attorney  Pre-existing out of facility DNR order (yellow form or pink MOST form) --  "MOST" Form in Place? --     Family Communication: Brother at  bedside, answered all questions Disposition Plan:   Patient is stable for discharge Consults called: None Admission status: Inpatient   Consultants:   Oncology, nephrology  Procedures:  CT Head Wo Contrast  Result Date: 12/18/2020 CLINICAL DATA:  Fever.  History of metastatic melanoma to the brain. EXAM: CT HEAD WITHOUT CONTRAST TECHNIQUE: Contiguous axial images were obtained from the base of the skull through the vertex without intravenous contrast. COMPARISON:  10/25/2020 FINDINGS: Brain: Hyperdense lesion again noted in the posterior left parietal lobe measuring 1.5 cm, stable since prior study. Numerous other hyperdense metastases noted. Encephalomalacia within the previously seen area of hemorrhage in the right basal ganglia. No new areas of hemorrhage, mass effect or midline shift. No hydrocephalus. Vascular: No hyperdense vessel or unexpected calcification. Skull: No acute calvarial abnormality. Sinuses/Orbits: No acute findings Other: None IMPRESSION: Extensive hemorrhagic metastases throughout the brain compatible with metastatic melanoma. No significant change since prior study. Electronically Signed   By: Rolm Baptise M.D.   On: 12/18/2020 18:39   CT Chest W Contrast  Result Date: 12/09/2020 CLINICAL DATA:  Metastatic melanoma, immunotherapy ongoing EXAM: CT CHEST WITH CONTRAST TECHNIQUE: Multidetector CT imaging of the chest was performed during intravenous contrast administration. CONTRAST:  63mL OMNIPAQUE IOHEXOL 300 MG/ML  SOLN COMPARISON:  11/01/2020 FINDINGS: Cardiovascular: Heart is normal in size.  No pericardial effusion. No evidence of thoracic aortic aneurysm. Mediastinum/Nodes: No suspicious mediastinal lymphadenopathy. Small right hilar nodes measuring up to 9 mm (series 2/image 70), previously 8 mm, grossly unchanged. Visualized thyroid is unremarkable. Lungs/Pleura: Multiple bilateral pulmonary nodules/metastases, including following index lesions: --11 mm nodule in the  anterior right upper lobe (series 5/image 66),  previously 12 mm --4 mm nodule in the posterior right upper lobe (series 5/image 68), previously 6 mm --5 mm nodule in the right lower lobe (series 5/image 70), previously 8 mm Mild scarring/atelectasis at the right lung base. Additional subpleural patchy opacity in the lingula and left lower lobe. No focal consolidation. No pleural effusion or pneumothorax. Upper Abdomen: Multifocal hepatic metastases, improved, including: --1.7 cm lesion in the posterior right hepatic dome (series 2/image 100), previously 2.6 cm --1.3 cm lesion in the caudate (series 2/image 131), previously 1.5 cm --1.7 cm lesion in the inferior right hepatic lobe (series 2/image 135), previously 2.2 cm Musculoskeletal: No focal osseous lesions. IMPRESSION: Improving pulmonary metastases, as above. Small right hilar nodes, grossly unchanged. Improving hepatic metastases, as above. Electronically Signed   By: Julian Hy M.D.   On: 12/09/2020 09:41   MR BRAIN W WO CONTRAST  Result Date: 12/14/2020 EXAM: MRI HEAD WITHOUT AND WITH CONTRAST TECHNIQUE: Multiplanar, multiecho pulse sequences of the brain and surrounding structures were obtained without and with intravenous contrast. CONTRAST:  48mL MULTIHANCE GADOBENATE DIMEGLUMINE 529 MG/ML IV SOLN COMPARISON:  None. FINDINGS: Brain: Interval slight decrease in size of numerous intrinsically T1 hyperintense lesions previously seen, likely largely secondary to resolving/evolving hemorrhage given signal characteristics. These lesions also demonstrate susceptibility artifact, compatible with hemorrhage. The T1 hyperintensity of these lesions limits evaluation for underlying enhancement. The T2/FLAIR hyperintense vasogenic edema surrounding the lesions is also slightly improved with decreased mass effect on the lateral ventricles. Index lesion in the left frontal lobe measures 2.8 x 2.2 by 2.1 cm (series 12, image 85), previously 3.4 x 2.7 x 2.4 cm.  Index lesion in the right basal ganglia measures 3.0 x 2.0 x 2.4 cm (series 12, image 95), previously 3.5 by 2.6 x 3.1 cm. Index lesion within the right occipital lobe measures 1.9 by 2.3 by 2.6 cm (series 12, image 75), previously 2.2 x 2.4 x 2.8 cm. Multiple new irregular/wispy areas of nonhemorrhagic enhancement in the left frontal and right parietal white matter (for example series 12, image 95 through 101 and images 105 through 109) and right frontal white matter (for example series 12, images 77 through 93). These areas do demonstrate DWI hyperintensity, but there is no convincing ADC correlate, therefore favored to represent T2 shine through. Similar postsurgical changes in the left occipital and left frontal lobes. No evidence of acute infarct. No hydrocephalus. No midline shift. Basal cisterns are patent. No extra-axial fluid collection. Vascular: Major arterial flow voids are maintained at the skull base. Skull and upper cervical spine: Prior left occipital and left frontal craniotomies. Sinuses/Orbits: Clear sinuses.  Unremarkable orbits. Other: No mastoid effusions. IMPRESSION: 1. Interval slight decrease in size of multiple hemorrhagic intracranial metastases previously seen, detailed above and likely largely secondary to resolving/evolving hemorrhage. Surrounding vasogenic edema is also slightly improved with decreased mass effect on the lateral ventricles. 2. Multiple new areas of nonhemorrhagic enhancement in bifrontal and right parietal white matter. These areas could represent new metastases, but the appearance of the irregular/wispy enhancement is nonspecific and inflammatory/treatment related change is a differential consideration. Consider short-interval followup to assess for change. Electronically Signed   By: Margaretha Sheffield MD   On: 12/14/2020 08:50   US RENAL  Result Date: 12/26/2020 CLINICAL DATA:  55 year old male with acute renal insufficiency. Metastatic melanoma. EXAM: RENAL /  URINARY TRACT ULTRASOUND COMPLETE COMPARISON:  CT Abdomen and Pelvis 09/14/2020. FINDINGS: Right Kidney: Renal measurements: 13.3 x 6.7 x 8.8 cm = volume: 406  mL. Echogenicity within normal limits. No mass or hydronephrosis visualized. Left Kidney: Renal measurements: 13.5 x 7.3 by 6.9 cm = volume: 355 mL. Echogenicity within normal limits. No mass or hydronephrosis visualized. Bladder: Diminutive.  Appears normal for degree of bladder distention. Other: None. IMPRESSION: Normal ultrasound appearance of both kidneys and the urinary bladder. Electronically Signed   By: Genevie Ann M.D.   On: 12/26/2020 08:17   DG Chest Port 1 View  Result Date: 12/18/2020 CLINICAL DATA:  Fever EXAM: PORTABLE CHEST 1 VIEW COMPARISON:  None. FINDINGS: The heart size and mediastinal contours are within normal limits. Mildly increased hazy airspace opacity is seen the left lower lung. The visualized skeletal structures are unremarkable. IMPRESSION: Mildly hazy airspace opacity at the left lower lung which could be due to atelectasis or infectious etiology. Electronically Signed   By: Prudencio Pair M.D.   On: 12/18/2020 18:30   US BIOPSY (KIDNEY)  Result Date: 12/26/2020 INDICATION: 55 year old male with a history of melanoma metastatic to the brain and now acute kidney injury. Concern for drug related nephritis. He presents for random renal biopsy. EXAM: ULTRASOUND GUIDED RENAL BIOPSY COMPARISON:  None. MEDICATIONS: Fentanyl 50 mcg IV; Versed 4 mg IV ANESTHESIA/SEDATION: Total Moderate Sedation time 10 minutes The patient's vital signs and level of consciousness were monitored continuously by radiology nursing under my direct supervision. COMPLICATIONS: None immediate PROCEDURE: Informed written consent was obtained from the patient after a discussion of the risks, benefits and alternatives to treatment. The patient understands and consents the procedure. A timeout was performed prior to the initiation of the procedure. Ultrasound  scanning was performed of the bilateral flanks. The inferior pole of the right kidney was selected for biopsy due to location and sonographic window. The procedure was planned. The operative site was prepped and draped in the usual sterile fashion. The overlying soft tissues were anesthetized with 1% lidocaine with epinephrine. A 17 gauge core needle biopsy device was advanced into the inferior cortex of the right kidney and 2 core biopsies were obtained under direct ultrasound guidance. Images were saved for documentation purposes. The biopsy device was removed and hemostasis was obtained with manual compression. Post procedural scanning was negative for significant post procedural hemorrhage or additional complication. A dressing was placed. The patient tolerated the procedure well without immediate post procedural complication. IMPRESSION: Technically successful ultrasound guided right renal biopsy. Electronically Signed   By: Jacqulynn Cadet M.D.   On: 12/26/2020 14:14     Antimicrobials:   None currently   Subjective: Patient stable Edge of bed conversing with brother No acute events overnight  Objective: Vitals:   12/29/20 2138 12/30/20 0644 12/30/20 1530 12/30/20 2118  BP: 123/78 110/67 113/76 129/80  Pulse: 80 76 78 75  Resp: 18 18 18 18   Temp:  98.2 F (36.8 C) (!) 97.5 F (36.4 C) 97.7 F (36.5 C)  TempSrc: Axillary Oral Axillary Oral  SpO2: 99% 97% 99% 95%  Weight:      Height:        Intake/Output Summary (Last 24 hours) at 12/31/2020 1129 Last data filed at 12/31/2020 0701 Gross per 24 hour  Intake 1080 ml  Output 2075 ml  Net -995 ml   Filed Weights   12/18/20 1709 12/18/20 2230 12/25/20 1400  Weight: 77.1 kg 79.3 kg 78.6 kg    Examination:  General exam:Appears calm and comfortable, no agitation noted Respiratory system: Clear to auscultation. Respiratory effort normal. Cardiovascular system:S1 &S2 heard, RRR. No JVD, murmurs, rubs,  gallops or clicks.  No pedal edema. Gastrointestinal system:Abdomen is nondistended, soft and nontender. No organomegaly or masses felt. Normal bowel sounds heard. Central nervous system:Alert,No newfocal neurological deficits. Extremities:warm and well perfused Skin: No rashes, lesions or ulcers Psychiatry:Judgement and insight impaired. Mood & affectblunted in setting of metastaitc melanoma to brain.     Data Reviewed: I have personally reviewed following labs and imaging studies  CBC: Recent Labs  Lab 12/26/20 0741 12/27/20 0452 12/29/20 0432  WBC 7.7 9.2 7.3  HGB 11.4* 11.6* 11.3*  HCT 33.8* 35.3* 33.2*  MCV 94.9 94.9 91.5  PLT 255 325 PLATELET CLUMPS NOTED ON SMEAR, UNABLE TO ESTIMATE   Basic Metabolic Panel: Recent Labs  Lab 12/26/20 0741 12/27/20 0452 12/28/20 0438 12/29/20 0432 12/30/20 0643  NA 140 141 142 141 137  K 3.6 3.9 3.9 3.8 3.9  CL 106 104 106 106 103  CO2 24 25 26 26 24   GLUCOSE 106* 110* 96 88 98  BUN 19 26* 26* 27* 29*  CREATININE 2.42* 2.66* 2.39* 2.26* 1.86*  CALCIUM 8.4* 8.7* 9.0 8.9 8.8*  MG  --  2.0 2.0 2.1  --   PHOS 4.9* 4.0  --   --  4.4   GFR: Estimated Creatinine Clearance: 48.7 mL/min (A) (by C-G formula based on SCr of 1.86 mg/dL (H)). Liver Function Tests: Recent Labs  Lab 12/26/20 0741 12/29/20 0432 12/30/20 0643  AST 50* 31  --   ALT 110* 82*  --   ALKPHOS 115 107  --   BILITOT 0.7 0.9  --   PROT 5.8* 6.0*  --   ALBUMIN 2.8* 2.9* 3.1*   No results for input(s): LIPASE, AMYLASE in the last 168 hours. No results for input(s): AMMONIA in the last 168 hours. Coagulation Profile: Recent Labs  Lab 12/26/20 1105  INR 1.0   Cardiac Enzymes: No results for input(s): CKTOTAL, CKMB, CKMBINDEX, TROPONINI in the last 168 hours. BNP (last 3 results) No results for input(s): PROBNP in the last 8760 hours. HbA1C: No results for input(s): HGBA1C in the last 72 hours. CBG: No results for input(s): GLUCAP in the last 168 hours. Lipid  Profile: No results for input(s): CHOL, HDL, LDLCALC, TRIG, CHOLHDL, LDLDIRECT in the last 72 hours. Thyroid Function Tests: No results for input(s): TSH, T4TOTAL, FREET4, T3FREE, THYROIDAB in the last 72 hours. Anemia Panel: No results for input(s): VITAMINB12, FOLATE, FERRITIN, TIBC, IRON, RETICCTPCT in the last 72 hours. Sepsis Labs: No results for input(s): PROCALCITON, LATICACIDVEN in the last 168 hours.  Recent Results (from the past 240 hour(s))  MRSA PCR Screening     Status: None   Collection Time: 12/24/20 10:07 AM   Specimen: Nasal Mucosa; Nasopharyngeal  Result Value Ref Range Status   MRSA by PCR NEGATIVE NEGATIVE Final    Comment:        The GeneXpert MRSA Assay (FDA approved for NASAL specimens only), is one component of a comprehensive MRSA colonization surveillance program. It is not intended to diagnose MRSA infection nor to guide or monitor treatment for MRSA infections. Performed at [facility], [place] [address], [place],  [address]          Radiology Studies: No results found.      Scheduled Meds: . citalopram  10 mg Oral QHS  . famotidine  20 mg Oral Daily  . feeding supplement  237 mL Oral TID BM  . levETIRAcetam  500 mg Oral BID  . multivitamin with minerals  1  tablet Oral Daily  . predniSONE  40 mg Oral Q breakfast  . senna-docusate  2 tablet Oral QHS   Continuous Infusions:   LOS: 12 days    Time spent: 35 minutes    Nicolette Bang, MD [facility]  If 7PM-7AM, please contact night-coverage  12/31/2020, 11:29 AM

## 2021-01-01 ENCOUNTER — Ambulatory Visit: Payer: 59 | Admitting: Oncology

## 2021-01-01 ENCOUNTER — Ambulatory Visit: Payer: 59

## 2021-01-01 ENCOUNTER — Other Ambulatory Visit: Payer: 59

## 2021-01-01 DIAGNOSIS — N10 Acute tubulo-interstitial nephritis: Secondary | ICD-10-CM | POA: Diagnosis not present

## 2021-01-01 DIAGNOSIS — C7931 Secondary malignant neoplasm of brain: Secondary | ICD-10-CM | POA: Diagnosis not present

## 2021-01-01 DIAGNOSIS — R6520 Severe sepsis without septic shock: Secondary | ICD-10-CM | POA: Diagnosis not present

## 2021-01-01 DIAGNOSIS — J189 Pneumonia, unspecified organism: Secondary | ICD-10-CM | POA: Diagnosis not present

## 2021-01-01 DIAGNOSIS — A419 Sepsis, unspecified organism: Secondary | ICD-10-CM | POA: Diagnosis not present

## 2021-01-01 MED ORDER — BOOST / RESOURCE BREEZE PO LIQD CUSTOM
1.0000 | ORAL | Status: DC
  Administered 2021-01-02 – 2021-01-03 (×2): 1 via ORAL

## 2021-01-01 MED ORDER — PROSOURCE PLUS PO LIQD
30.0000 mL | Freq: Every day | ORAL | Status: DC
  Administered 2021-01-01 – 2021-01-03 (×3): 30 mL via ORAL
  Filled 2021-01-01: qty 30

## 2021-01-01 MED ORDER — ENSURE ENLIVE PO LIQD
237.0000 mL | Freq: Two times a day (BID) | ORAL | Status: DC
  Administered 2021-01-01 – 2021-01-03 (×5): 237 mL via ORAL

## 2021-01-01 MED ORDER — LORAZEPAM 2 MG/ML IJ SOLN
1.0000 mg | Freq: Once | INTRAMUSCULAR | Status: AC
  Administered 2021-01-01: 1 mg via INTRAVENOUS

## 2021-01-01 NOTE — TOC Progression Note (Addendum)
Transition of Care [facility]) - Progression Note    Patient Details  Name: Sean [name] MRN: [id number] Date of Birth: 07-Feb-1966  Transition of Care [facility]) CM/SW Contact  Ross Ludwig, [place] Phone Number: 01/01/2021, 10:17 AM  Clinical Narrative:     CSW attempted to contact [place] to get an update on insurance authorization.  CSW had to leave a message, awaiting a call back.  2:10pm  CSW received update from [place] that insurance authorization had to be restarted because the one they expired.  CSW awaiting for authorization.   Expected Discharge Plan: Skilled Nursing Facility Barriers to Discharge: Ship broker  Expected Discharge Plan and Services Expected Discharge Plan: [place]   Discharge Planning Services: CM Consult Post Acute Care Choice: Resumption of Svcs/PTA Provider Living arrangements for the past 2 months: Single Family Home,Skilled Nursing Facility                                       Social Determinants of Health (SDOH) Interventions    Readmission Risk Interventions No flowsheet data found.

## 2021-01-01 NOTE — Plan of Care (Signed)
  Problem: Health Behavior/Discharge Planning: Goal: Ability to manage health-related needs will improve Outcome: Progressing   Problem: Activity: Goal: Risk for activity intolerance will decrease Outcome: Progressing   Problem: Nutrition: Goal: Adequate nutrition will be maintained Outcome: Progressing   Problem: Safety: Goal: Ability to remain free from injury will improve Outcome: Progressing   

## 2021-01-01 NOTE — Progress Notes (Signed)
PHYSICAL THERAPY  Attempted twice to see.  Unable to arouse pt enough to participate.  Rica Koyanagi  PTA Acute  Rehabilitation Services Pager      [phone] Office      [phone]

## 2021-01-01 NOTE — Progress Notes (Signed)
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 Patient does have baseline cognitive dysfunction due to metastatic brain disease and is only oriented to self and place. History was obtained from the patient's family who reported a fever of 101.4 F with increased breathing. In the ED patient was noted to be slightly tachycardic and had mild hypotension. Laboratory data showed normal leukocyte count but lactate was elevated at 2.4 which trended up to 2.7. COVID-19 and influenza test was negative. Chest x-ray was reported as left lower lobe airspace opacity. In the ED, CT head was performed which showed stable but extensive hemorrhagic metastasis. Patient received 2 L of lactated ringer, Rocephin and Zithromax and was admitted to hospital for further evaluation and treatment.  During hospitalization, patient was noted to have elevated creatinine which started trending up despite IV fluids. He was more agitated and required one-to-one sitter and Ativan for agitation. Nephrology was consulted due to concern for nivolumab induced toxicity.IR guided biopsy was done on 12/26/2020 which showed tubular interstitial nephritis. 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   Agitation, confusion.Intermittently requiring Ativan and one-to-one sitter.Could not use Haldol due to history of seizures. Continue Ativan as needed for extreme agitation.We will continue to hold discontinue ritalin for now.Could start at a lower dose when started.   Metastatic melanoma-with extensive mets including brain.  S/p craniotomy and resection of left occipital and left frontal lesion in 09/2020.CT head shows stable extensive hemorrhagic mets. Followed by Dr [name], oncology. Was on dexamethasone. Has been changed to prednisone as per nephrology and oncology recommendation in view of tubulointerstitialnephritis. Continue Keppra.   Acute kidney injury  Baseline creatinine of around 0.6-0.7. Initially received IV fluids but his creatinine started trending up. Bladder scan did not show any residual urine volume. Nephrology was consulted due to possibility of nivolumab induced toxicity. Renal ultrasound was negative for acute findings. Biopsy was performed by interventional radiology on 4/12/2022with findings of acute tubulointerstitial nephritis.Marland KitchenDexamethasone has been changed to prednisone. Follow oncology and nephrology recommendation. Creatinine has started to trend down.With Dr. [name] nephrology. Patient might be stable for discharge on oral prednisone and he would follow the patient as outpatient. He was okay for discharge if his disposition was certain.  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   Elevated LFTs. Likely secondary to metastatic disease. Hepatitis panel was nonreactive.Latest AST31ALT 82-decreasing.   Hypokalemia:Replenished. Potassium of 3.8today.  Mild hypernatremia.Improved. Sodium level of 141  Cognitive impairment likely from brain mets.  Alert, awake and oriented to person only, Requiring one-to-one sitter and Ativan for agitation. Gets agitated  intermittently.  Generalized weakness/debility Continue physical therapy, occupational therapy evaluation. Patient is currently at the skilled nursing facilitylevel of care and awaiting for medical stabilization prior to discharge.  DVT prophylaxis: SCD/Compression stockings  Code Status: Partial    Code Status Orders  (From admission, onward)         Start     Ordered   12/21/20 1059  Limited resuscitation (code)  Continuous       Question Answer Comment  In the event of cardiac or respiratory ARREST: Initiate Code Blue, Call Rapid Response Yes   In the event of cardiac or respiratory ARREST: Perform CPR Yes   In the event of cardiac or respiratory ARREST: Perform Intubation/Mechanical Ventilation No   In the event of cardiac or respiratory ARREST: Use NIPPV/BiPAp only if indicated Yes   In the event of cardiac or respiratory ARREST: Administer ACLS medications if indicated Yes   In the event of cardiac or respiratory ARREST: Perform Defibrillation or Cardioversion if indicated Yes      12/21/20 1058        Code Status History    Date Active Date Inactive Code Status Order ID Comments User Context   12/19/2020 1219 12/21/2020 1058 DNR [id number]  Mercy Riding, MD Inpatient   12/19/2020 0834 12/19/2020 1219 Full Code [id number]  Mercy Riding, MD Inpatient   10/26/2020 1508 11/09/2020 1515 Full Code [id number]  Cathlyn Parsons, PA-C Inpatient   10/26/2020 1508 10/26/2020 1508 Full Code [id number]  Cathlyn Parsons, PA-C Inpatient   10/20/2020 2106 10/26/2020 1502 Full Code [id number]  Lenore Cordia, MD ED   09/15/2020 0309 09/18/2020 2006 Full Code [id number]  Rise Patience, MD ED   Advance Care Planning Activity    Advance Directive Documentation   Flowsheet Row Most Recent Value  Type of Advance Directive Healthcare Power of Attorney  Pre-existing out of facility DNR order (yellow form or pink MOST form) --  "MOST" Form in Place? --     Family Communication: Discussed with  brother at bedside in detail Sunday, no additional information or follow-up with details of disposition with insurance authorization and rehab are known Disposition Plan:  Patient is stable for discharge Consults called: None Admission status: Inpatient   Consultants:   Oncology, nephrology  Procedures:  CT Head Wo Contrast  Result Date: 12/18/2020 CLINICAL DATA:  Fever.  History of metastatic melanoma to the brain. EXAM: CT HEAD WITHOUT CONTRAST TECHNIQUE: Contiguous axial images were obtained from the base of the skull through the vertex without intravenous contrast. COMPARISON:  10/25/2020 FINDINGS: Brain: Hyperdense lesion again noted in the posterior left parietal lobe measuring 1.5 cm, stable since prior study. Numerous other hyperdense metastases noted. Encephalomalacia within the previously seen area of hemorrhage in the right basal ganglia. No new areas of hemorrhage, mass effect or midline shift. No hydrocephalus. Vascular: No hyperdense vessel or unexpected calcification. Skull: No acute calvarial abnormality. Sinuses/Orbits: No acute findings Other: None IMPRESSION: Extensive hemorrhagic metastases throughout the brain compatible with metastatic melanoma. No significant change since prior study. Electronically Signed   By: Rolm Baptise M.D.   On: 12/18/2020 18:39   CT Chest W Contrast  Result Date: 12/09/2020 CLINICAL DATA:  Metastatic melanoma, immunotherapy ongoing EXAM: CT CHEST WITH CONTRAST TECHNIQUE: Multidetector CT imaging of the chest was performed during intravenous contrast administration. CONTRAST:  64mL OMNIPAQUE IOHEXOL 300 MG/ML  SOLN COMPARISON:  11/01/2020 FINDINGS: Cardiovascular: Heart is normal in size.  No pericardial effusion. No evidence of thoracic aortic aneurysm. Mediastinum/Nodes: No suspicious mediastinal lymphadenopathy. Small right hilar nodes measuring up to 9 mm (series 2/image 70), previously 8 mm, grossly unchanged. Visualized thyroid is unremarkable.  Lungs/Pleura: Multiple bilateral pulmonary  nodules/metastases, including following index lesions: --11 mm nodule in the anterior right upper lobe (series 5/image 66), previously 12 mm --4 mm nodule in the posterior right upper lobe (series 5/image 68), previously 6 mm --5 mm nodule in the right lower lobe (series 5/image 70), previously 8 mm Mild scarring/atelectasis at the right lung base. Additional subpleural patchy opacity in the lingula and left lower lobe. No focal consolidation. No pleural effusion or pneumothorax. Upper Abdomen: Multifocal hepatic metastases, improved, including: --1.7 cm lesion in the posterior right hepatic dome (series 2/image 100), previously 2.6 cm --1.3 cm lesion in the caudate (series 2/image 131), previously 1.5 cm --1.7 cm lesion in the inferior right hepatic lobe (series 2/image 135), previously 2.2 cm Musculoskeletal: No focal osseous lesions. IMPRESSION: Improving pulmonary metastases, as above. Small right hilar nodes, grossly unchanged. Improving hepatic metastases, as above. Electronically Signed   By: Julian Hy M.D.   On: 12/09/2020 09:41   MR BRAIN W WO CONTRAST  Result Date: 12/14/2020 EXAM: MRI HEAD WITHOUT AND WITH CONTRAST TECHNIQUE: Multiplanar, multiecho pulse sequences of the brain and surrounding structures were obtained without and with intravenous contrast. CONTRAST:  36mL MULTIHANCE GADOBENATE DIMEGLUMINE 529 MG/ML IV SOLN COMPARISON:  None. FINDINGS: Brain: Interval slight decrease in size of numerous intrinsically T1 hyperintense lesions previously seen, likely largely secondary to resolving/evolving hemorrhage given signal characteristics. These lesions also demonstrate susceptibility artifact, compatible with hemorrhage. The T1 hyperintensity of these lesions limits evaluation for underlying enhancement. The T2/FLAIR hyperintense vasogenic edema surrounding the lesions is also slightly improved with decreased mass effect on the lateral ventricles.  Index lesion in the left frontal lobe measures 2.8 x 2.2 by 2.1 cm (series 12, image 85), previously 3.4 x 2.7 x 2.4 cm. Index lesion in the right basal ganglia measures 3.0 x 2.0 x 2.4 cm (series 12, image 95), previously 3.5 by 2.6 x 3.1 cm. Index lesion within the right occipital lobe measures 1.9 by 2.3 by 2.6 cm (series 12, image 75), previously 2.2 x 2.4 x 2.8 cm. Multiple new irregular/wispy areas of nonhemorrhagic enhancement in the left frontal and right parietal white matter (for example series 12, image 95 through 101 and images 105 through 109) and right frontal white matter (for example series 12, images 77 through 93). These areas do demonstrate DWI hyperintensity, but there is no convincing ADC correlate, therefore favored to represent T2 shine through. Similar postsurgical changes in the left occipital and left frontal lobes. No evidence of acute infarct. No hydrocephalus. No midline shift. Basal cisterns are patent. No extra-axial fluid collection. Vascular: Major arterial flow voids are maintained at the skull base. Skull and upper cervical spine: Prior left occipital and left frontal craniotomies. Sinuses/Orbits: Clear sinuses.  Unremarkable orbits. Other: No mastoid effusions. IMPRESSION: 1. Interval slight decrease in size of multiple hemorrhagic intracranial metastases previously seen, detailed above and likely largely secondary to resolving/evolving hemorrhage. Surrounding vasogenic edema is also slightly improved with decreased mass effect on the lateral ventricles. 2. Multiple new areas of nonhemorrhagic enhancement in bifrontal and right parietal white matter. These areas could represent new metastases, but the appearance of the irregular/wispy enhancement is nonspecific and inflammatory/treatment related change is a differential consideration. Consider short-interval followup to assess for change. Electronically Signed   By: Margaretha Sheffield MD   On: 12/14/2020 08:50   US  RENAL  Result Date: 12/26/2020 CLINICAL DATA:  55 year old male with acute renal insufficiency. Metastatic melanoma. EXAM: RENAL / URINARY TRACT ULTRASOUND COMPLETE COMPARISON:  CT Abdomen  and Pelvis 09/14/2020. FINDINGS: Right Kidney: Renal measurements: 13.3 x 6.7 x 8.8 cm = volume: 406 mL. Echogenicity within normal limits. No mass or hydronephrosis visualized. Left Kidney: Renal measurements: 13.5 x 7.3 by 6.9 cm = volume: 355 mL. Echogenicity within normal limits. No mass or hydronephrosis visualized. Bladder: Diminutive.  Appears normal for degree of bladder distention. Other: None. IMPRESSION: Normal ultrasound appearance of both kidneys and the urinary bladder. Electronically Signed   By: Genevie Ann M.D.   On: 12/26/2020 08:17   DG Chest Port 1 View  Result Date: 12/18/2020 CLINICAL DATA:  Fever EXAM: PORTABLE CHEST 1 VIEW COMPARISON:  None. FINDINGS: The heart size and mediastinal contours are within normal limits. Mildly increased hazy airspace opacity is seen the left lower lung. The visualized skeletal structures are unremarkable. IMPRESSION: Mildly hazy airspace opacity at the left lower lung which could be due to atelectasis or infectious etiology. Electronically Signed   By: Prudencio Pair M.D.   On: 12/18/2020 18:30   US BIOPSY (KIDNEY)  Result Date: 12/26/2020 INDICATION: 55 year old male with a history of melanoma metastatic to the brain and now acute kidney injury. Concern for drug related nephritis. He presents for random renal biopsy. EXAM: ULTRASOUND GUIDED RENAL BIOPSY COMPARISON:  None. MEDICATIONS: Fentanyl 50 mcg IV; Versed 4 mg IV ANESTHESIA/SEDATION: Total Moderate Sedation time 10 minutes The patient's vital signs and level of consciousness were monitored continuously by radiology nursing under my direct supervision. COMPLICATIONS: None immediate PROCEDURE: Informed written consent was obtained from the patient after a discussion of the risks, benefits and alternatives to treatment.  The patient understands and consents the procedure. A timeout was performed prior to the initiation of the procedure. Ultrasound scanning was performed of the bilateral flanks. The inferior pole of the right kidney was selected for biopsy due to location and sonographic window. The procedure was planned. The operative site was prepped and draped in the usual sterile fashion. The overlying soft tissues were anesthetized with 1% lidocaine with epinephrine. A 17 gauge core needle biopsy device was advanced into the inferior cortex of the right kidney and 2 core biopsies were obtained under direct ultrasound guidance. Images were saved for documentation purposes. The biopsy device was removed and hemostasis was obtained with manual compression. Post procedural scanning was negative for significant post procedural hemorrhage or additional complication. A dressing was placed. The patient tolerated the procedure well without immediate post procedural complication. IMPRESSION: Technically successful ultrasound guided right renal biopsy. Electronically Signed   By: Jacqulynn Cadet M.D.   On: 12/26/2020 14:14     Antimicrobials:  None cur patient resting in bed comfortably this morning rently   Subjective: Patient resting in bed comfortably without complaint No acute events overnight Awaiting transfer to skilled nursing facility  Objective: Vitals:   12/30/20 1530 12/30/20 2118 12/31/20 2210 01/01/21 1315  BP: 113/76 129/80 116/74 123/80  Pulse: 78 75 79 72  Resp: 18 18 20 19   Temp: (!) 97.5 F (36.4 C) 97.7 F (36.5 C) 98 F (36.7 C) 98.2 F (36.8 C)  TempSrc: Axillary Oral Oral Oral  SpO2: 99% 95% 100% 96%  Weight:      Height:        Intake/Output Summary (Last 24 hours) at 01/01/2021 1351 Last data filed at 01/01/2021 0313 Gross per 24 hour  Intake 240 ml  Output 1100 ml  Net -860 ml   Filed Weights   12/18/20 1709 12/18/20 2230 12/25/20 1400  Weight: 77.1 kg 79.3 kg  78.6 kg     Examination:  General exam:Appears calm and comfortable, no agitation noted Respiratory system: Clear to auscultation. Respiratory effort normal. Cardiovascular system:S1 &S2 heard, RRR. No JVD, murmurs, rubs, gallops or clicks. No pedal edema. Gastrointestinal system:Abdomen is nondistended, soft and nontender. No organomegaly or masses felt. Normal bowel sounds heard. Central nervous system:Alert,No newfocal neurological deficits. Extremities:warm and well perfused Skin: No rashes, lesions or ulcers Psychiatry:Judgement and insight impaired. Mood & affectblunted in setting of metastaitc melanoma to brain.     Data Reviewed: I have personally reviewed following labs and imaging studies  CBC: Recent Labs  Lab 12/26/20 0741 12/27/20 0452 12/29/20 0432  WBC 7.7 9.2 7.3  HGB 11.4* 11.6* 11.3*  HCT 33.8* 35.3* 33.2*  MCV 94.9 94.9 91.5  PLT 255 325 PLATELET CLUMPS NOTED ON SMEAR, UNABLE TO ESTIMATE   Basic Metabolic Panel: Recent Labs  Lab 12/26/20 0741 12/27/20 0452 12/28/20 0438 12/29/20 0432 12/30/20 0643  NA 140 141 142 141 137  K 3.6 3.9 3.9 3.8 3.9  CL 106 104 106 106 103  CO2 24 25 26 26 24   GLUCOSE 106* 110* 96 88 98  BUN 19 26* 26* 27* 29*  CREATININE 2.42* 2.66* 2.39* 2.26* 1.86*  CALCIUM 8.4* 8.7* 9.0 8.9 8.8*  MG  --  2.0 2.0 2.1  --   PHOS 4.9* 4.0  --   --  4.4   GFR: Estimated Creatinine Clearance: 48.7 mL/min (A) (by C-G formula based on SCr of 1.86 mg/dL (H)). Liver Function Tests: Recent Labs  Lab 12/26/20 0741 12/29/20 0432 12/30/20 0643  AST 50* 31  --   ALT 110* 82*  --   ALKPHOS 115 107  --   BILITOT 0.7 0.9  --   PROT 5.8* 6.0*  --   ALBUMIN 2.8* 2.9* 3.1*   No results for input(s): LIPASE, AMYLASE in the last 168 hours. No results for input(s): AMMONIA in the last 168 hours. Coagulation Profile: Recent Labs  Lab 12/26/20 1105  INR 1.0   Cardiac Enzymes: No results for input(s): CKTOTAL, CKMB, CKMBINDEX,  TROPONINI in the last 168 hours. BNP (last 3 results) No results for input(s): PROBNP in the last 8760 hours. HbA1C: No results for input(s): HGBA1C in the last 72 hours. CBG: No results for input(s): GLUCAP in the last 168 hours. Lipid Profile: No results for input(s): CHOL, HDL, LDLCALC, TRIG, CHOLHDL, LDLDIRECT in the last 72 hours. Thyroid Function Tests: No results for input(s): TSH, T4TOTAL, FREET4, T3FREE, THYROIDAB in the last 72 hours. Anemia Panel: No results for input(s): VITAMINB12, FOLATE, FERRITIN, TIBC, IRON, RETICCTPCT in the last 72 hours. Sepsis Labs: No results for input(s): PROCALCITON, LATICACIDVEN in the last 168 hours.  Recent Results (from the past 240 hour(s))  MRSA PCR Screening     Status: None   Collection Time: 12/24/20 10:07 AM   Specimen: Nasal Mucosa; Nasopharyngeal  Result Value Ref Range Status   MRSA by PCR NEGATIVE NEGATIVE Final    Comment:        The GeneXpert MRSA Assay (FDA approved for NASAL specimens only), is one component of a comprehensive MRSA colonization surveillance program. It is not intended to diagnose MRSA infection nor to guide or monitor treatment for MRSA infections. Performed at [facility], [place] [address], [place],  [address]          Radiology Studies: No results found.      Scheduled Meds: . citalopram  10 mg Oral QHS  .  famotidine  20 mg Oral Daily  . feeding supplement  237 mL Oral TID BM  . levETIRAcetam  500 mg Oral BID  . multivitamin with minerals  1 tablet Oral Daily  . predniSONE  40 mg Oral Q breakfast  . senna-docusate  2 tablet Oral QHS   Continuous Infusions:   LOS: 13 days    Time spent: 35 min    Nicolette Bang, MD [facility]  If 7PM-7AM, please contact night-coverage  01/01/2021, 1:51 PM

## 2021-01-01 NOTE — Plan of Care (Signed)
  Problem: Health Behavior/Discharge Planning: Goal: Ability to manage health-related needs will improve Outcome: Progressing   Problem: Clinical Measurements: Goal: Will remain free from infection Outcome: Progressing Goal: Respiratory complications will improve Outcome: Progressing Goal: Cardiovascular complication will be avoided Outcome: Progressing   Problem: Elimination: Goal: Will not experience complications related to bowel motility Outcome: Progressing Goal: Will not experience complications related to urinary retention Outcome: Progressing   Problem: Pain Managment: Goal: General experience of comfort will improve Outcome: Progressing   Problem: Safety: Goal: Ability to remain free from injury will improve Outcome: Progressing   Problem: Skin Integrity: Goal: Risk for impaired skin integrity will decrease Outcome: Progressing   Problem: Fluid Volume: Goal: Hemodynamic stability will improve Outcome: Progressing   Problem: Clinical Measurements: Goal: Diagnostic test results will improve Outcome: Progressing Goal: Signs and symptoms of infection will decrease Outcome: Progressing   Problem: Respiratory: Goal: Ability to maintain adequate ventilation will improve Outcome: Progressing

## 2021-01-01 NOTE — Progress Notes (Signed)
IP PROGRESS NOTE  Subjective:   Mr. Dudzinski has no complaint.  Objective: Vital signs in last 24 hours: Blood pressure 123/80, pulse 72, temperature 98.2 F (36.8 C), temperature source Oral, resp. rate 19, height 6' (1.829 m), weight 173 lb 4.5 oz (78.6 kg), SpO2 96 %.  Intake/Output from previous day: 04/17 0701 - 04/18 0700 In: 1110 [P.O.:1110] Out: 2450 [Urine:2450]  Physical Exam:  HEENT: No thrush Abdomen: Nontender, no hepatosplenomegaly Extremities: No leg edema Skin: No rash Neurologic: Alert, oriented place and diagnosis of melanoma.  Not oriented to year.  Follows simple commands.  Moves extremities to command.  Lab Results: No results for input(s): WBC, HGB, HCT, PLT in the last 72 hours.  BMET Recent Labs    12/30/20 0643  NA 137  K 3.9  CL 103  CO2 24  GLUCOSE 98  BUN 29*  CREATININE 1.86*  CALCIUM 8.8*    Lab Results  Component Value Date   CEA1 0.6 09/15/2020    Medications: I have reviewed the patient's current medications.  Assessment/Plan:  1. 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, unknown primary tumor site  CT brain 09/14/2020-multiple hemorrhagic metastases with surrounding edema in the bilateral cerebral hemispheres  CTs chest, abdomen, pelvis 09/14/2020-1.4 cm right upper lobe subpleural nodule, 0.5 cm right lower lobe nodule, multiple hypodensities in the liver suspicious for metastases, 1.5 cm right upper pole hypodense kidney lesion-indeterminate  MRI abdomen 09/15/2020-multifocal T1 hyperintense liver lesions, no kidney mass  Ultrasound-guided biopsy of a segment 4B liver lesion 09/18/2020-benign liver parenchyma with steatosis, no evidence of malignancy  PET 09/25/2020-focal area of hypermetabolism in the right hilum, multiple areas of the liver, pelvic musculature, and left thigh-no CT correlate with any of these areas on the noncontrast CT, 10 mm anterior right upper lobe nodule with low-level  hypermetabolism, additional tiny pulmonary nodules too small to characterize by PET  SRS to 10 brain lesions 09/27/2020  Resection of left occipital and left frontal lesions on 09/29/2020-pathology consistent with malignant melanoma  Cycle 1 ipilimumab/nivolumab 10/16/2020  CT of the brain without contrast 10/26/2020 showed significant interval increase in the number and size of numerous metastases  Brain MRI 10/24/2020-multiple new and increased brain metastases, moderate edema surrounding multiple lesions, new mass-effect at the right lateral ventricle  Brain MRI 11/01/2020-stable appearance of his multiple intracranial metastases and associated edema and mild mass-effect.  CT of the chest with contrast 11/01/2020-interval progression of bilateral pulmonary nodules concerning for metastatic disease, small right hilar lymph nodes, new and progressive ill-defined lesions in the liver compatible with progression of metastatic involvement.  Cycle 2 nivolumab 11/10/2020  Cycle 3 nivolumab 11/27/2020  MRI brain 12/13/2020-decrease in size of hemorrhagic brain metastases, improvement in vasogenic edema and mass-effect, new areas of nonhemorrhagic enhancement by frontal and right parietal white matter-nonspecific  CT chest 12/08/2020-decreased size of lung and liver metastases  Cycle 4 nivolumab 12/18/2020 2.Left-sided weakness, new onset seizure secondary to #1, weakness has resolved 3.Hyperlipidemia 4.Elevated liver enzymes and bilirubin  Liver enzymes persistently elevated 5.Headache and nausea/vomiting secondary to #1, resolved 6. Hospital admission 10/20/2020-encephalopathy, slow Decadron taper-down to 2 mg at discharge 11/09/2020 7.  Admission 12/18/2020 with fever 8.  Acute renal failure secondary to nivolumab induced nephritis, prednisone started 12/26/2020 9.  Altered mental status secondary to metastatic melanoma involving the brain  Mr. Altschuler has been more alert and communicative  over the past few weeks.  There is no clinical evidence for progression of the myeloma.  The elevated  liver enzymes and renal failure have improved.  The renal failure is secondary to nivolumab.  The elevated liver enzymes could also be related to nivolumab therapy, though they were elevated prior to beginning treatment.  Nivolumab will remain on hold.  We will consider a rechallenge with nivolumab if the prednisone can be tapered to less than or equal to 10 mg daily.  Recommendations: 1.  Continue OT/PT, return to the skilled nursing facility at discharge 2.  Continue Keppra.  He will have outpatient follow-up with neuro-oncology. 3.  Prednisone per nephrology, outpatient follow-up in the nephrology clinic this week 4.  Outpatient follow-up as scheduled at the cancer center on 01/17/2021. 5.  Please call oncology as needed  I updated his wife by telephone.  She feels he is more oriented and less agitated while on Ritalin.    LOS: 13 days   Betsy Coder, MD   01/01/2021, 4:04 PM

## 2021-01-01 NOTE — Progress Notes (Signed)
Nutrition Follow-up  DOCUMENTATION CODES:   Non-severe (moderate) malnutrition in context of chronic illness  INTERVENTION:  - will decrease Ensure Enlive from TID to BID, each supplement provides 350 kcal and 20 grams of protein. - will order Boost Breeze once/day, each supplement provides 250 kcal and 9 grams of protein. - will order order 30 ml Prosource Plus once/day, each supplement provides 100 kcal and 15 grams protein.  - continue Magic Cup BID with meals, each supplement provides 290 kcal and 9 grams of protein. - weigh patient today.    NUTRITION DIAGNOSIS:   Moderate Malnutrition related to chronic illness (Metastatic melanoma and CA treatment, cognitive impairment) as evidenced by severe fat depletion,moderate fat depletion,mild muscle depletion,moderate muscle depletion. 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  Recent meal intakes were: 4/15- 60% of dinner 4/16- 75% of breakfast and 75% of lunch 4/17- 5% of breakfast, 25% of lunch, and 0% of dinner  Patient is currently sleeping with no family or visitors present at bedside. Lunch tray has been delivered but he is sleeping.  Able to talk with RN outside of the room. She reports that patient has been sleeping for the past 1 hour and it is preferable to allow patient to continue sleeping at this time. Patient refused breakfast and AM bottle of Ensure. Patient has not complained of abdominal pain, pressure, or nausea.   He has not been weighed since 4/11. Non-pitting edema to BLE documented in the edema section of flow sheet.   Per notes: - sepsis has  resolved - metastatic melanoma with extensive mets including to brain--s/p crani and resection in 09/2020 - AKI d/t ATN - cognitive impairment thought to be 2/2 brain mets - generalized weakness and debility   Labs reviewed; BUN: 29 mg/dl, creatinine: 1.86 mg/dl, GFR: 42 ml/min.  Medications reviewed; 20 mg oral pepcid/day, 1 tablet multivitamin with minerals/day, 40 mg deltasone/day, 2 tablet senokot/day.    Diet Order:   Diet Order            Diet regular Room service appropriate? Yes; Fluid consistency: Thin  Diet effective now                 EDUCATION NEEDS:   Education needs have been addressed  Skin:  Skin Assessment: Reviewed RN Assessment Skin Integrity Issues:: Incisions Incisions: L posterior head  Last BM:  4/13  Height:   Ht Readings from Last 1 Encounters:  12/18/20 6' (1.829 m)    Weight:   Wt Readings from Last 1 Encounters:  12/25/20 78.6 kg    Estimated Nutritional Needs:  Kcal:  2350-2675 kcal Protein:  110-125 grams Fluid:  >/= 2.3 L/day     Jarome Matin, MS, RD, LDN, CNSC Inpatient Clinical Dietitian RD pager # available in [place]  After hours/weekend pager # available in [facility]

## 2021-01-02 DIAGNOSIS — R6520 Severe sepsis without septic shock: Secondary | ICD-10-CM | POA: Diagnosis not present

## 2021-01-02 DIAGNOSIS — A419 Sepsis, unspecified organism: Secondary | ICD-10-CM | POA: Diagnosis not present

## 2021-01-02 LAB — SARS CORONAVIRUS 2 (TAT 6-24 HRS): SARS Coronavirus 2: NEGATIVE

## 2021-01-02 MED ORDER — HYDROXYZINE HCL 10 MG PO TABS
10.0000 mg | ORAL_TABLET | Freq: Four times a day (QID) | ORAL | Status: DC | PRN
  Administered 2021-01-02 – 2021-01-03 (×2): 10 mg via ORAL
  Filled 2021-01-02 (×4): qty 1

## 2021-01-02 NOTE — Progress Notes (Signed)
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 Patient does have baseline cognitive dysfunction due to metastatic brain disease and is only oriented to self and place. History was obtained from the patient's family who reported a fever of 101.4 F with increased breathing. In the ED, patient was noted to be slightly tachycardic and had mild hypotension. Laboratory data showed normal leukocyte count but lactate was elevated at 2.4 which trended up to 2.7. COVID-19 and influenza test was negative. Chest x-ray was reported as left lower lobe airspace opacity.  CT head showed stable but extensive hemorrhagic metastasis. Patient received 2 L of lactated ringer, Rocephin and Zithromax and was admitted to hospital for further evaluation and treatment.  During hospitalization, patient was noted to have elevated creatinine which started trending up despite IV fluids. He was more agitated and required one-to-one sitter and Ativan for agitation. Nephrology was consulted due to concern for nivolumab induced toxicity.IR guided biopsy was done on 12/26/2020 which showed tubular interstitial nephritis. Patient was continued on prednisone.   New events last 24 hours / Subjective: Patient not very interactive on my examination this morning.  Does not answer any questions, covers himself up with a blanket instead.  Appears to be comfortable  Assessment & Plan:   Principal Problem:   Severe sepsis ([place]) Active Problems:   Brain metastases (HCC)   Seizure (HCC)   Hypokalemia   Elevated LFTs   Malignant melanoma metastatic to brain (HCC)   Pneumonia    Malnutrition of moderate degree   Acute tubulointerstitial nephritis   Severe sepsis due to community-acquired pneumonia -Completed a 5-day course of Rocephin and Zithromax  Agitation, confusion with underlying cognitive deficit/impairment -Intermittently requiring Ativan and one-to-one sitter.Could not use Haldol due to history of seizures. Continue Ativan as needed for extreme agitation.  Metastatic melanoma-with extensive mets including brain -S/p craniotomy and resection of left occipital and left frontal lesion in 09/2020.CT head shows stable extensive hemorrhagic mets. Followed by Dr [name], oncology. Was on dexamethasone. Has been changed to prednisone as per nephrology and oncology recommendation in view of tubulointerstitialnephritis. Continue Keppra.   Acute kidney injury  -Baseline creatinine of around 0.6-0.7. Initially received IV fluids but his creatinine started trending up. Bladder scan did not show any residual urine volume. Nephrology was consulted due to possibility of nivolumab induced toxicity. Renal ultrasound was negative for acute findings. Biopsy was performed by interventional radiology on 4/12/2022with findings of acute tubulointerstitial nephritis.Dexamethasone has been changed to prednisone. Follow oncology and nephrology recommendation. Creatinine has started to trend down.With Dr. [name] nephrology. Patient might be stable for discharge on oral prednisone and he would follow the patient as outpatient.   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 Hepatitis panel was nonreactive  Generalized weakness/debility -SNF placement pending   DVT prophylaxis:  SCDs Start: 12/19/20 8850  Code Status: Partial code, DO NOT INTUBATE Family Communication: None at bedside Disposition Plan:  Status is: Inpatient  Remains inpatient appropriate because:Unsafe d/c  plan   Dispo: The patient is from: SNF              Anticipated d/c is to:  SNF              Patient currently is medically stable to d/c.  Awaiting insurance authorization for SNF discharge   Difficult to place patient No   Antimicrobials:  Anti-infectives (From admission, onward)   Start     Dose/Rate Route Frequency Ordered Stop   12/19/20 1800  cefTRIAXone (ROCEPHIN) 1 g in sodium chloride 0.9 % 100 mL IVPB        1 g 200 mL/hr over 30 Minutes Intravenous Every 24 hours 12/19/20 0715 12/22/20 2216   12/19/20 1500  azithromycin (ZITHROMAX) 500 mg in sodium chloride 0.9 % 250 mL IVPB        500 mg 250 mL/hr over 60 Minutes Intravenous Every 24 hours 12/19/20 1355 12/23/20 1755   12/18/20 2015  cefTRIAXone (ROCEPHIN) 1 g in sodium chloride 0.9 % 100 mL IVPB        1 g 200 mL/hr over 30 Minutes Intravenous  Once 12/18/20 2011 12/18/20 2125   12/18/20 2015  azithromycin (ZITHROMAX) 500 mg in sodium chloride 0.9 % 250 mL IVPB  Status:  Discontinued        500 mg 250 mL/hr over 60 Minutes Intravenous  Once 12/18/20 2011 12/19/20 1355        Objective: Vitals:   12/31/20 2210 01/01/21 1315 01/01/21 1959 01/02/21 1141  BP: 116/74 123/80 (!) 129/97 112/86  Pulse: 79 72 (!) 105 80  Resp: 20 19 18 16   Temp: 98 F (36.7 C) 98.2 F (36.8 C) 98.1 F (36.7 C) 97.6 F (36.4 C)  TempSrc: Oral Oral Oral Oral  SpO2: 100% 96% 96% 96%  Weight:      Height:        Intake/Output Summary (Last 24 hours) at 01/02/2021 1207 Last data filed at 01/02/2021 0641 Gross per 24 hour  Intake 120 ml  Output 850 ml  Net -730 ml   Filed Weights   12/18/20 1709 12/18/20 2230 12/25/20 1400  Weight: 77.1 kg 79.3 kg 78.6 kg    Examination:  General exam: Appears calm and comfortable  Respiratory system: Clear to auscultation. Respiratory effort normal. No respiratory distress.  Cardiovascular system: S1 & S2 heard, RRR. No murmurs. No pedal edema. Gastrointestinal system: Abdomen is  nondistended, soft and nontender. Normal bowel sounds heard. Central nervous system: Alert  Extremities: Symmetric in appearance  Skin: No rashes, lesions or ulcers on exposed skin  Psychiatry: Stable  Data Reviewed: I have personally reviewed following labs and imaging studies  CBC: Recent Labs  Lab 12/27/20 0452 12/29/20 0432  WBC 9.2 7.3  HGB 11.6* 11.3*  HCT 35.3* 33.2*  MCV 94.9 91.5  PLT 325 PLATELET CLUMPS NOTED ON SMEAR, UNABLE TO ESTIMATE   Basic Metabolic Panel: Recent Labs  Lab 12/27/20 0452 12/28/20 0438 12/29/20 0432 12/30/20 0643  NA 141 142 141 137  K 3.9 3.9 3.8 3.9  CL 104 106 106 103  CO2 25 26 26 24   GLUCOSE 110* 96 88 98  BUN 26* 26* 27* 29*  CREATININE 2.66* 2.39* 2.26* 1.86*  CALCIUM 8.7* 9.0 8.9 8.8*  MG 2.0 2.0 2.1  --   PHOS 4.0  --   --  4.4   GFR: Estimated Creatinine Clearance: 48.7 mL/min (A) (by C-G formula based on SCr of 1.86 mg/dL (H)). Liver Function Tests: Recent Labs  Lab 12/29/20 0432 12/30/20 0643  AST 31  --   ALT 82*  --   ALKPHOS 107  --   BILITOT  0.9  --   PROT 6.0*  --   ALBUMIN 2.9* 3.1*   No results for input(s): LIPASE, AMYLASE in the last 168 hours. No results for input(s): AMMONIA in the last 168 hours. Coagulation Profile: No results for input(s): INR, PROTIME in the last 168 hours. Cardiac Enzymes: No results for input(s): CKTOTAL, CKMB, CKMBINDEX, TROPONINI in the last 168 hours. BNP (last 3 results) No results for input(s): PROBNP in the last 8760 hours. HbA1C: No results for input(s): HGBA1C in the last 72 hours. CBG: No results for input(s): GLUCAP in the last 168 hours. Lipid Profile: No results for input(s): CHOL, HDL, LDLCALC, TRIG, CHOLHDL, LDLDIRECT in the last 72 hours. Thyroid Function Tests: No results for input(s): TSH, T4TOTAL, FREET4, T3FREE, THYROIDAB in the last 72 hours. Anemia Panel: No results for input(s): VITAMINB12, FOLATE, FERRITIN, TIBC, IRON, RETICCTPCT in the last 72  hours. Sepsis Labs: No results for input(s): PROCALCITON, LATICACIDVEN in the last 168 hours.  Recent Results (from the past 240 hour(s))  MRSA PCR Screening     Status: None   Collection Time: 12/24/20 10:07 AM   Specimen: Nasal Mucosa; Nasopharyngeal  Result Value Ref Range Status   MRSA by PCR NEGATIVE NEGATIVE Final    Comment:        The GeneXpert MRSA Assay (FDA approved for NASAL specimens only), is one component of a comprehensive MRSA colonization surveillance program. It is not intended to diagnose MRSA infection nor to guide or monitor treatment for MRSA infections. Performed at [facility], [place] [address], [place], [place] [address]   SARS CORONAVIRUS 2 (TAT 6-24 HRS) Nasopharyngeal Nasopharyngeal Swab     Status: None   Collection Time: 01/01/21  5:00 PM   Specimen: Nasopharyngeal Swab  Result Value Ref Range Status   SARS Coronavirus 2 NEGATIVE NEGATIVE Final    Comment: (NOTE) SARS-CoV-2 target nucleic acids are NOT DETECTED.  The SARS-CoV-2 RNA is generally detectable in upper and lower respiratory specimens during the acute phase of infection. Negative results do not preclude SARS-CoV-2 infection, do not rule out co-infections with other pathogens, and should not be used as the sole basis for treatment or other patient management decisions. Negative results must be combined with clinical observations, patient history, and epidemiological information. The expected result is Negative.  Fact Sheet for Patients: SugarRoll.be  Fact Sheet for Healthcare Providers: https://www.woods-mathews.com/  This test is not yet approved or cleared by the [place] FDA and  has been authorized for detection and/or diagnosis of SARS-CoV-2 by FDA under an Emergency Use Authorization (EUA). This EUA will remain  in effect (meaning this test can be used) for the duration of the COVID-19 declaration under Se  ction 564(b)(1) of the Act, 21 U.S.C. section 360bbb-3(b)(1), unless the authorization is terminated or revoked sooner.  Performed at [facility], [place] [address] [place], [place] [address]       Radiology Studies: No results found.    Scheduled Meds: . (feeding supplement) PROSource Plus  30 mL Oral Daily  . citalopram  10 mg Oral QHS  . famotidine  20 mg Oral Daily  . feeding supplement  1 Container Oral Q24H  . feeding supplement  237 mL Oral BID BM  . levETIRAcetam  500 mg Oral BID  . multivitamin with minerals  1 tablet Oral Daily  . predniSONE  40 mg Oral Q breakfast  . senna-docusate  2 tablet Oral QHS   Continuous Infusions:   LOS: 14  days      Time spent: 25 minutes   Dessa Phi, DO [facility] 01/02/2021, 12:07 PM   Available via Epic secure chat 7am-7pm After these hours, please refer to coverage provider listed on [email or web address]

## 2021-01-02 NOTE — Progress Notes (Signed)
Appropriate Use Committee Chart Review  Chart reviewed by the physician advisor with input from [facility] and the attending physician as needed for review of the appropriateness for placement referral.  TOC notes, PT/OT/ST notes, nursing notes and physician notes reviewed for medical necessity to determine if the patient's needs are appropriate for short-term rehab to return to a prior level of function versus the likely need for custodial care.  At this time, PT and OT are commending long term placement as patient is unable to participate with therapy or engage during examination.He has a history of metastatic melanoma with extensive brain mets and presented with severe sepsis due to pneumonia.   Recommendations: long term placement  A consult to the Transitions of Care Team has been made to facilitate placement.    Sean Ford, DO Physician Advisor  01/02/2021 1:46 PM

## 2021-01-02 NOTE — Progress Notes (Signed)
Occupational Therapy Treatment Patient Details Name: Sean [name] MRN: [id number] DOB: 1965/10/13 Today's Date: 01/02/2021    History of present illness Sean [name] is a 55 y.o. male.  Presents to ER with concern for fever following cancer treatment on 12/18/20.  Pt admitted for Severe sepsis due to community-acquired pneumonia.  Patient has a history of metastatic melanoma.  Extensive brain mets.   OT comments  Patient found supine in bed naked with external male catheter that failed and sitting on soiled linens. Patient demonstrates ability to grasp cups and drink from cups consistently but then places on bed beside him. Therapist cued patient or repositioned cups on bedside table. Treatment focused on dressing task today. Patient max assist to don shirt, pants/depends and socks. Patient able to don right sock when handed to him but needed assistance for left. Then pulled sock off and tried to do it himself again. Max assist to don shirt, depends and pants  - patient has the physical abilities to assist but not the cognition. Needed 100% verbal and tactile cues to get clothing on but at the same time unable to comprehend commands. With therapist asking him to put clothing on patient actively pushing clothing down or away. Patient max assist to transfer into sitting today - predominantly from not comprehending the command and therapist physically assisting. Patient's conversation is 90% inappropriate in context of the conversation. +2 assistance needed for standing to pull clothing up due to patient's inability to follow commands and impaired balance. Patient has been seen for the last two weeks by OT to attempt to progress towards goals and determine cues that patient could follow. However, patient has not progressed towards goals and his functional abilities have been inconsistent. Patient has not demonstrated any ability to purposefully participate in therapy or exhibit any carryover due to cognitive  deficits. Patient does not exhibit any therapy potential at this time. Patient needs 24/7 custodial care at this time and therapist recommends long term care. OT will sign off.    Follow Up Recommendations  Other (comment) (Custodial care or 24/7 assistance)    Equipment Recommendations       Recommendations for Other Services      Precautions / Restrictions Precautions Precautions: Fall Precaution Comments: cognitive deficits Restrictions Weight Bearing Restrictions: No       Mobility Bed Mobility Overal bed mobility: Needs Assistance Bed Mobility: Supine to Sit     Supine to sit: Max assist     General bed mobility comments: Max assist for tactile cues to get patient to transfer to side of bed    Transfers Overall transfer level: Needs assistance Equipment used: 2 person hand held assist Transfers: Sit to/from Stand Sit to Stand: Min assist;+2 physical assistance;+2 safety/equipment;From elevated surface         General transfer comment: Needs multimodal cues and tactile cues 100% of the time to follow commands and perform functional task.    Balance Overall balance assessment: Needs assistance Sitting-balance support: Feet supported Sitting balance-Leahy Scale: Fair     Standing balance support: During functional activity Standing balance-Leahy Scale: Poor                             ADL either performed or assessed with clinical judgement   ADL                   Upper Body Dressing : Maximal assistance;Bed level Upper Body Dressing Details (indicate  cue type and reason): max assist to get patient to don shirt. Patient unable to follow commands predominantly. Able to begin to initiate task but than began pulling it off. Lower Body Dressing: Maximal assistance;Sit to/from stand Lower Body Dressing Details (indicate cue type and reason): Max assist to don Depends and pants and socks. Patient once again unable to consistently follow  commands - needing multimodal cues and therapist performing the task to get clothing on with patient activity trying to push clothing down - unable to understand command to put clothing on. Initally able to don right sock - needing assistance with left and then pulled sock off.       Toileting - Clothing Manipulation Details (indicate cue type and reason): predominantly incontinent     Functional mobility during ADLs: +2 for safety/equipment General ADL Comments: patient high fall risk due to poor dyanmic balance and needing cues for redirection/safety     Vision   Vision Assessment?: No apparent visual deficits   Perception     Praxis      Cognition Arousal/Alertness: Awake/alert Behavior During Therapy: Flat affect Overall Cognitive Status: History of cognitive impairments - at baseline Area of Impairment: Orientation;Attention;Memory;Following commands;Safety/judgement;Awareness;Problem solving                 Orientation Level: Disoriented to;Place;Time;Situation Current Attention Level: Sustained Memory: Decreased short-term memory Following Commands: Follows one step commands inconsistently Safety/Judgement: Decreased awareness of safety;Decreased awareness of deficits Awareness: Intellectual Problem Solving: Slow processing;Decreased initiation;Difficulty sequencing;Requires verbal cues;Requires tactile cues General Comments: needs multimodal cues and redirection to tasks        Exercises     Shoulder Instructions       General Comments      Pertinent Vitals/ Pain       Pain Assessment: Faces Faces Pain Scale: No hurt  Home Living                                          Prior Functioning/Environment              Frequency           Progress Toward Goals  OT Goals(current goals can now be found in the care plan section)  Progress towards OT goals: Goals met/education completed, patient discharged from [facility]  Other (comment) (Patient has not met goals. Do not expect him to meet goals consistently. Will DC from therapy.)    Co-evaluation          OT goals addressed during session: ADL's and self-care      AM-PAC OT "6 Clicks" Daily Activity     Outcome Measure   Help from another person eating meals?: A Little Help from another person taking care of personal grooming?: A Little Help from another person toileting, which includes using toliet, bedpan, or urinal?: Total Help from another person bathing (including washing, rinsing, drying)?: A Lot Help from another person to put on and taking off regular upper body clothing?: A Lot Help from another person to put on and taking off regular lower body clothing?: A Lot 6 Click Score: 13    End of Session    OT Visit Diagnosis: Other abnormalities of gait and mobility (R26.89);Muscle weakness (generalized) (M62.81);Other symptoms and signs involving cognitive function;Other symptoms and signs involving the nervous system (R29.898)   Activity Tolerance Patient tolerated treatment well  Patient Left in bed;with call bell/phone within reach;with bed alarm set   Nurse Communication Mobility status        Time: 6854-8830 OT Time Calculation (min): 12 min  Charges: OT General Charges $OT Visit: 1 Visit OT Treatments $Self Care/Home Management : 8-22 mins  Derl Barrow, OTR/L [place]  Office [phone] Pager: [place] 01/02/2021, 12:45 PM

## 2021-01-02 NOTE — TOC Progression Note (Signed)
Transition of Care [facility]) - Progression Note    Patient Details  Name: Sean [name] MRN: [id number] Date of Birth: [birth year]  Transition of Care [facility]) CM/SW Contact  Janeli Lewison, Juliann Pulse, RN Phone Number: 01/02/2021, 1:21 PM  Clinical Narrative: Awaiting response from [facility] rep [phone] for [place]. Noted-PT/OT-LTC-not able to participate in therapy-recc palliative cons.      Expected Discharge Plan: Skilled Nursing Facility Barriers to Discharge: Ship broker  Expected Discharge Plan and Services Expected Discharge Plan: [place]   Discharge Planning Services: CM Consult Post Acute Care Choice: Resumption of Svcs/PTA Provider Living arrangements for the past 2 months: Single Family Home,Skilled Nursing Facility                                       Social Determinants of Health (SDOH) Interventions    Readmission Risk Interventions No flowsheet data found.

## 2021-01-02 NOTE — Plan of Care (Signed)
  Problem: Clinical Measurements: Goal: Ability to maintain clinical measurements within normal limits will improve Outcome: Progressing   Problem: Safety: Goal: Ability to remain free from injury will improve Outcome: Progressing   Problem: Nutrition: Goal: Adequate nutrition will be maintained Outcome: Progressing

## 2021-01-02 NOTE — Progress Notes (Signed)
Physical Therapy Treatment Patient Details Name: Sean [name] MRN: [id number] DOB: 1965/10/05 Today's Date: 01/02/2021    History of Present Illness Curly Mackowski is a 55 y.o. male.  Presents to ER with concern for fever following cancer treatment on 12/18/20.  Pt admitted for Severe sepsis due to community-acquired pneumonia.  Patient has a history of metastatic melanoma.  Extensive brain mets.    PT Comments    Patient requires extensive tactile and  Visual cues to perform  Bed mobility. Patient  working against therapists when attempting to stand. Patient can stand and ambulate with 2 assisting hand hold or RW  when able to participate.  Patient is at a  functional level where he requires custodial level of care. Patient's cognitive level is such that skilled PT   Has maximized benefits.  PT will sign off. Recommend nursing attempt ambulation when patient  Is able to mobilize with 2 assisting.  Follow Up Recommendations  No PT follow up (recommned 24/7 for custodial care)     Equipment Recommendations  None recommended by PT    Recommendations for Other Services       Precautions / Restrictions Precautions Precautions: Fall Precaution Comments: cognitive deficits Restrictions Weight Bearing Restrictions: No    Mobility  Bed Mobility Overal bed mobility: Needs Assistance Bed Mobility: Supine to Sit Rolling: Max assist;+2 for physical assistance;+2 for safety/equipment   Supine to sit: Max assist Sit to supine: Min assist;Mod assist   General bed mobility comments: Max assist for tactile cues to get patient to transfer to side of bed    Transfers Overall transfer level: Needs assistance Equipment used: 2 person hand held assist;Rolling walker (2 wheeled) Transfers: Sit to/from Stand Sit to Stand: Max assist;Min assist         General transfer comment: variable  assistnace due to lack of following  verbal directions, working against therapist in  standing  Ambulation/Gait Ambulation/Gait assistance: Mod assist;+2 safety/equipment   Assistive device: Rolling walker (2 wheeled);1 person hand held assist       General Gait Details: initially holding pants with 1 hand and RW with other. Then took 2  HHA to ambulate, strong cues to turn around and ambulate back to rooom.   Stairs             Wheelchair Mobility    Modified Rankin (Stroke Patients Only)       Balance Overall balance assessment: Needs assistance Sitting-balance support: Feet supported Sitting balance-Leahy Scale: Fair Sitting balance - Comments: able to sit side of bed without UE   Standing balance support: During functional activity Standing balance-Leahy Scale: Poor Standing balance comment: bvariable depending on patient's ability to follow and perform                            Cognition Arousal/Alertness: Awake/alert Behavior During Therapy: Flat affect Overall Cognitive Status: History of cognitive impairments - at baseline Area of Impairment: Orientation;Attention;Memory;Following commands;Safety/judgement;Awareness;Problem solving                 Orientation Level: Disoriented to;Place;Time;Situation Current Attention Level: Sustained Memory: Decreased short-term memory Following Commands: Follows one step commands inconsistently Safety/Judgement: Decreased awareness of safety;Decreased awareness of deficits Awareness: Intellectual Problem Solving: Slow processing;Decreased initiation;Difficulty sequencing;Requires verbal cues;Requires tactile cues General Comments: needs multimodal cues and redirection to tasks, patient at times does not follow verbal or visual directions      Exercises      General Comments  Pertinent Vitals/Pain Pain Assessment: Faces Faces Pain Scale: No hurt    Home Living                      Prior Function            PT Goals (current goals can now be found in the  care plan section) Progress towards PT goals: Not progressing toward goals - comment (patient inconsistent in ability to follow verbal/tactile cues, inconsistent in functional performeance due to cognu=itive staus.)    Frequency           PT Plan Discharge plan needs to be updated    Co-evaluation PT/OT/SLP Co-Evaluation/Treatment: Yes Reason for Co-Treatment: Complexity of the patient's impairments (multi-system involvement);For patient/therapist safety PT goals addressed during session: Mobility/safety with mobility OT goals addressed during session: ADL's and self-care      AM-PAC PT "6 Clicks" Mobility   Outcome Measure  Help needed turning from your back to your side while in a flat bed without using bedrails?: A Lot Help needed moving from lying on your back to sitting on the side of a flat bed without using bedrails?: A Lot Help needed moving to and from a bed to a chair (including a wheelchair)?: A Lot Help needed standing up from a chair using your arms (e.g., wheelchair or bedside chair)?: A Lot Help needed to walk in hospital room?: A Lot Help needed climbing 3-5 steps with a railing? : Total 6 Click Score: 11    End of Session Equipment Utilized During Treatment: Gait belt Activity Tolerance: Patient tolerated treatment well Patient left: in bed;with bed alarm set;with call bell/phone within reach Nurse Communication: Mobility status PT Visit Diagnosis: Unsteadiness on feet (R26.81);Difficulty in walking, not elsewhere classified (R26.2)     Time: 9417-4081 PT Time Calculation (min) (ACUTE ONLY): 10 min  Charges:  $Gait Training: 8-22 mins                     [place] Pager [phone] Office [phone]    Claretha Cooper 01/02/2021, 1:22 PM

## 2021-01-03 ENCOUNTER — Encounter (HOSPITAL_COMMUNITY): Payer: Self-pay | Admitting: Nephrology

## 2021-01-03 ENCOUNTER — Encounter (HOSPITAL_COMMUNITY): Payer: Self-pay | Admitting: Internal Medicine

## 2021-01-03 DIAGNOSIS — R6520 Severe sepsis without septic shock: Secondary | ICD-10-CM | POA: Diagnosis not present

## 2021-01-03 DIAGNOSIS — A419 Sepsis, unspecified organism: Secondary | ICD-10-CM | POA: Diagnosis not present

## 2021-01-03 LAB — SURGICAL PATHOLOGY

## 2021-01-03 MED ORDER — LORAZEPAM 1 MG PO TABS: 1.0000 mg | ORAL_TABLET | Freq: Two times a day (BID) | ORAL | 0 refills | Status: DC | PRN

## 2021-01-03 MED ORDER — PREDNISONE 20 MG PO TABS: 40.0000 mg | ORAL_TABLET | Freq: Every day | ORAL | 0 refills | Status: DC

## 2021-01-03 NOTE — Progress Notes (Signed)
Patient picked up from [facility] to head to [facility]. Patient stable and alert. Patient was given PRN Ativan and Melatonin with nighttime medication prior to discharge from facility.

## 2021-01-03 NOTE — Progress Notes (Signed)
Report called to receiving nurse  Sean [name] all questions answered. [phone])  CM/SW called PT[facility].   Pt currently resting NAD.   RN will continue to monitor pt.

## 2021-01-03 NOTE — TOC Progression Note (Signed)
Transition of Care [facility]) - Progression Note    Patient Details  Name: Sean [name] MRN: [id number] Date of Birth: Nov 14, 1965  [facility]) CM/SW Contact  Laney Louderback, Juliann Pulse, RN Phone Number: 01/03/2021, 12:10 PM  Clinical Narrative:  Per Wandra Feinstein rep [place] awaiting auth hopeful for today. covid neg 4/18 good till 4/21.    Expected Discharge Plan: Skilled Nursing Facility Barriers to Discharge: Ship broker  Expected Discharge Plan and Services Expected Discharge Plan: [place]   Discharge Planning Services: CM Consult Post Acute Care Choice: Resumption of Svcs/PTA Provider Living arrangements for the past 2 months: Single Family Home,Skilled Nursing Facility                                       Social Determinants of Health (SDOH) Interventions    Readmission Risk Interventions No flowsheet data found.

## 2021-01-03 NOTE — Plan of Care (Signed)
°  Problem: Safety: °Goal: Ability to remain free from injury will improve °Outcome: Progressing °  °Problem: Activity: °Goal: Risk for activity intolerance will decrease °Outcome: Progressing °  °

## 2021-01-03 NOTE — TOC Transition Note (Signed)
Transition of Care [facility]) - CM/SW Discharge Note   Patient Details  Name: Edge Mauger MRN: [id number] Date of Birth: [birth year]  Transition of Care [facility]) CM/SW Contact:  Dessa Phi, RN Phone Number: 01/03/2021, 1:35 PM   Clinical Narrative: d/c today [place]-going to [id number],tel# report [phone]. PTAR called. No further CM needs.        Barriers to Discharge: No Barriers Identified   Patient Goals and CMS Choice Patient states their goals for this hospitalization and ongoing recovery are:: to return to [email or web address] Compare Post Acute Care list provided to:: Patient Represenative (must comment) Choice offered to / list presented to : Spouse  Discharge Placement              Patient chooses bed at: Other - please specify in the comment section below: [facility]) Patient to be transferred to facility by: [place] Name of family member notified: [name] dtr [phone] Patient and family notified of of transfer: 01/03/21  Discharge Plan and Services   Discharge Planning Services: CM Consult Post Acute Care Choice: Resumption of Svcs/PTA Provider                               Social Determinants of Health (SDOH) Interventions     Readmission Risk Interventions No flowsheet data found.

## 2021-01-03 NOTE — Discharge Summary (Signed)
Physician Discharge Summary  Sean [name] OFB:[id number] DOB: 01/26/1966 DOA: 12/18/2020  PCP: [name], MD  Admit date: 12/18/2020 Discharge date: 01/03/2021  Admitted From: SNF Disposition:  SNF  Recommendations for Outpatient Follow-up:  1. Follow up with [place] in 1 week to discuss prednisone taper  2. Follow up with Oncology Dr. [name] as scheduled 5/4  Discharge Condition: Stable CODE STATUS: Do not intubate  Diet recommendation:  Diet Orders (From admission, onward)    Start     Ordered   12/26/20 1441  Diet regular Room service appropriate? Yes; Fluid consistency: Thin  Diet effective now       Question Answer Comment  Room service appropriate? 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 Patient does have baseline cognitive dysfunction due to metastatic brain disease and is only oriented to self and place. History was obtained from the patient's family who reported a fever of 101.4 F with increased breathing. In the ED, patient was noted to be slightly tachycardic and had mild hypotension. Laboratory data showed normal leukocyte count but lactate was elevated at 2.4 which trended up to 2.7. COVID-19 and influenza test was negative. Chest x-ray was reported as left lower lobe airspace opacity.  CT head showed stable but extensive hemorrhagic metastasis. Patient received 2 L of lactated ringer, Rocephin and Zithromax and was admitted to hospital for further evaluation and treatment.  During hospitalization, patient was noted to have elevated creatinine which started trending up despite IV fluids. He was more agitated and required one-to-one  sitter and Ativan for agitation. Nephrology was consulted due to concern for nivolumab induced toxicity.IR guided biopsy was done on 12/26/2020 which showed tubular interstitial nephritis. Patient was continued on prednisone.   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 Followed by Dr [name], oncology. Was on dexamethasone. Has been changed to prednisone as per nephrology and oncology recommendation in view of tubulointerstitialnephritis. Continue Keppra.   Acute kidney injury  -Baseline creatinine of around 0.6-0.7. Initially received IV fluids but his creatinine started trending up. Bladder scan did not show any residual urine volume. Nephrology was consulted due to possibility of nivolumab induced toxicity. Renal ultrasound was negative for acute findings. Biopsy was performed by interventional radiology on 4/12/2022with findings of acute tubulointerstitial nephritis.Dexamethasone has been changed to prednisone. Follow oncology and nephrology recommendation. Creatinine has started to trend down.With Dr. [name] nephrology. Patient might be stable for discharge on oral prednisone and he would follow the patient as outpatient.   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 Hepatitis  panel was nonreactive  Discharge Instructions  Discharge Instructions    Increase activity slowly   Complete by: As directed    No wound care   Complete by: As directed      Allergies as of 01/03/2021   No Known Allergies     Medication List    STOP taking these medications   dexamethasone 2 MG tablet Commonly known as: DECADRON   HYDROcodone-acetaminophen 5-325 MG tablet Commonly known as: NORCO/VICODIN   methylphenidate 5 MG tablet Commonly known as: Ritalin     TAKE these medications   acetaminophen 325 MG tablet Commonly known as: TYLENOL Take 2 tablets (650 mg total) by mouth every 6 (six) hours as needed for mild pain (or Fever >/= 101).   citalopram 10 MG tablet Commonly known as: CELEXA Take 1 tablet (10 mg total) by mouth at bedtime.   levETIRAcetam 500 MG tablet Commonly known as: KEPPRA Take 1 tablet (500 mg total) by mouth 2 (two) times daily.   LORazepam 1 MG tablet Commonly known as: Ativan Take 1 tablet (1 mg total) by mouth 2 (two) times daily as needed for anxiety.   melatonin 5 MG Tabs Take 1 tablet (5 mg total) by mouth at bedtime as needed.   multivitamin with minerals Tabs tablet Take 1 tablet by mouth daily. What changed:   how much to take  additional instructions   pantoprazole 40 MG tablet Commonly known as: PROTONIX Take 1 tablet (40 mg total) by mouth daily.   predniSONE 20 MG tablet Commonly known as: DELTASONE Take 2 tablets (40 mg total) by mouth daily with breakfast. Start taking on: January 04, 2021   senna-docusate 8.6-50 MG tablet Commonly known as: Senokot-S Take 2 tablets by mouth at bedtime.       Follow-up Information    [name], MD. Go on 01/17/2021.   Specialty: Oncology Contact information: [place] [address] [phone]        [place], [place]. Schedule an appointment as soon as possible for a visit in 1 week(s).   Why: See Nephrology to discuss prednisone  taper  Contact information: [place] [address] [phone]              No Known Allergies    Procedures/Studies: CT Head Wo Contrast  Result Date: 12/18/2020 CLINICAL DATA:  Fever.  History of metastatic melanoma to the brain. EXAM: CT HEAD WITHOUT CONTRAST TECHNIQUE: Contiguous axial images were obtained from the base of the skull through the vertex without intravenous contrast. COMPARISON:  10/25/2020 FINDINGS: Brain: Hyperdense lesion again noted in the posterior left parietal lobe measuring 1.5 cm, stable since prior study. Numerous other hyperdense metastases noted. Encephalomalacia within the previously seen area of hemorrhage in the right basal ganglia. No new areas of hemorrhage, mass effect or midline shift. No hydrocephalus. Vascular: No hyperdense vessel or unexpected calcification. Skull: No acute calvarial abnormality. Sinuses/Orbits: No acute findings Other: None IMPRESSION: Extensive hemorrhagic metastases throughout the brain compatible with metastatic melanoma. No significant change since prior study. Electronically Signed   By: Rolm Baptise M.D.   On: 12/18/2020 18:39   CT Chest W Contrast  Result Date: 12/09/2020 CLINICAL DATA:  Metastatic melanoma, immunotherapy ongoing EXAM: CT CHEST WITH CONTRAST TECHNIQUE: Multidetector CT imaging of the chest was performed during intravenous contrast administration. CONTRAST:  11mL OMNIPAQUE IOHEXOL 300 MG/ML  SOLN COMPARISON:  11/01/2020 FINDINGS: Cardiovascular: Heart is normal in size.  No pericardial effusion. No evidence of thoracic aortic aneurysm. Mediastinum/Nodes:  No suspicious mediastinal lymphadenopathy. Small right hilar nodes measuring up to 9 mm (series 2/image 70), previously 8 mm, grossly unchanged. Visualized thyroid is unremarkable. Lungs/Pleura: Multiple bilateral pulmonary nodules/metastases, including following index lesions: --11 mm nodule in the anterior right upper lobe (series 5/image 66),  previously 12 mm --4 mm nodule in the posterior right upper lobe (series 5/image 68), previously 6 mm --5 mm nodule in the right lower lobe (series 5/image 70), previously 8 mm Mild scarring/atelectasis at the right lung base. Additional subpleural patchy opacity in the lingula and left lower lobe. No focal consolidation. No pleural effusion or pneumothorax. Upper Abdomen: Multifocal hepatic metastases, improved, including: --1.7 cm lesion in the posterior right hepatic dome (series 2/image 100), previously 2.6 cm --1.3 cm lesion in the caudate (series 2/image 131), previously 1.5 cm --1.7 cm lesion in the inferior right hepatic lobe (series 2/image 135), previously 2.2 cm Musculoskeletal: No focal osseous lesions. IMPRESSION: Improving pulmonary metastases, as above. Small right hilar nodes, grossly unchanged. Improving hepatic metastases, as above. Electronically Signed   By: Julian Hy M.D.   On: 12/09/2020 09:41   MR BRAIN W WO CONTRAST  Result Date: 12/14/2020 EXAM: MRI HEAD WITHOUT AND WITH CONTRAST TECHNIQUE: Multiplanar, multiecho pulse sequences of the brain and surrounding structures were obtained without and with intravenous contrast. CONTRAST:  61mL MULTIHANCE GADOBENATE DIMEGLUMINE 529 MG/ML IV SOLN COMPARISON:  None. FINDINGS: Brain: Interval slight decrease in size of numerous intrinsically T1 hyperintense lesions previously seen, likely largely secondary to resolving/evolving hemorrhage given signal characteristics. These lesions also demonstrate susceptibility artifact, compatible with hemorrhage. The T1 hyperintensity of these lesions limits evaluation for underlying enhancement. The T2/FLAIR hyperintense vasogenic edema surrounding the lesions is also slightly improved with decreased mass effect on the lateral ventricles. Index lesion in the left frontal lobe measures 2.8 x 2.2 by 2.1 cm (series 12, image 85), previously 3.4 x 2.7 x 2.4 cm. Index lesion in the right basal ganglia  measures 3.0 x 2.0 x 2.4 cm (series 12, image 95), previously 3.5 by 2.6 x 3.1 cm. Index lesion within the right occipital lobe measures 1.9 by 2.3 by 2.6 cm (series 12, image 75), previously 2.2 x 2.4 x 2.8 cm. Multiple new irregular/wispy areas of nonhemorrhagic enhancement in the left frontal and right parietal white matter (for example series 12, image 95 through 101 and images 105 through 109) and right frontal white matter (for example series 12, images 77 through 93). These areas do demonstrate DWI hyperintensity, but there is no convincing ADC correlate, therefore favored to represent T2 shine through. Similar postsurgical changes in the left occipital and left frontal lobes. No evidence of acute infarct. No hydrocephalus. No midline shift. Basal cisterns are patent. No extra-axial fluid collection. Vascular: Major arterial flow voids are maintained at the skull base. Skull and upper cervical spine: Prior left occipital and left frontal craniotomies. Sinuses/Orbits: Clear sinuses.  Unremarkable orbits. Other: No mastoid effusions. IMPRESSION: 1. Interval slight decrease in size of multiple hemorrhagic intracranial metastases previously seen, detailed above and likely largely secondary to resolving/evolving hemorrhage. Surrounding vasogenic edema is also slightly improved with decreased mass effect on the lateral ventricles. 2. Multiple new areas of nonhemorrhagic enhancement in bifrontal and right parietal white matter. These areas could represent new metastases, but the appearance of the irregular/wispy enhancement is nonspecific and inflammatory/treatment related change is a differential consideration. Consider short-interval followup to assess for change. Electronically Signed   By: Margaretha Sheffield MD   On: 12/14/2020 08:50  US RENAL  Result Date: 12/26/2020 CLINICAL DATA:  55 year old male with acute renal insufficiency. Metastatic melanoma. EXAM: RENAL / URINARY TRACT ULTRASOUND COMPLETE  COMPARISON:  CT Abdomen and Pelvis 09/14/2020. FINDINGS: Right Kidney: Renal measurements: 13.3 x 6.7 x 8.8 cm = volume: 406 mL. Echogenicity within normal limits. No mass or hydronephrosis visualized. Left Kidney: Renal measurements: 13.5 x 7.3 by 6.9 cm = volume: 355 mL. Echogenicity within normal limits. No mass or hydronephrosis visualized. Bladder: Diminutive.  Appears normal for degree of bladder distention. Other: None. IMPRESSION: Normal ultrasound appearance of both kidneys and the urinary bladder. Electronically Signed   By: Genevie Ann M.D.   On: 12/26/2020 08:17   DG Chest Port 1 View  Result Date: 12/18/2020 CLINICAL DATA:  Fever EXAM: PORTABLE CHEST 1 VIEW COMPARISON:  None. FINDINGS: The heart size and mediastinal contours are within normal limits. Mildly increased hazy airspace opacity is seen the left lower lung. The visualized skeletal structures are unremarkable. IMPRESSION: Mildly hazy airspace opacity at the left lower lung which could be due to atelectasis or infectious etiology. Electronically Signed   By: Prudencio Pair M.D.   On: 12/18/2020 18:30   US BIOPSY ([place])  Result Date: 12/26/2020 INDICATION: 55 year old male with a history of melanoma metastatic to the brain and now acute kidney injury. Concern for drug related nephritis. He presents for random renal biopsy. EXAM: ULTRASOUND GUIDED RENAL BIOPSY COMPARISON:  None. MEDICATIONS: Fentanyl 50 mcg IV; Versed 4 mg IV ANESTHESIA/SEDATION: Total Moderate Sedation time 10 minutes The patient's vital signs and level of consciousness were monitored continuously by radiology nursing under my direct supervision. COMPLICATIONS: None immediate PROCEDURE: Informed written consent was obtained from the patient after a discussion of the risks, benefits and alternatives to treatment. The patient understands and consents the procedure. A timeout was performed prior to the initiation of the procedure. Ultrasound scanning was performed of the  bilateral flanks. The inferior pole of the right kidney was selected for biopsy due to location and sonographic window. The procedure was planned. The operative site was prepped and draped in the usual sterile fashion. The overlying soft tissues were anesthetized with 1% lidocaine with epinephrine. A 17 gauge core needle biopsy device was advanced into the inferior cortex of the right kidney and 2 core biopsies were obtained under direct ultrasound guidance. Images were saved for documentation purposes. The biopsy device was removed and hemostasis was obtained with manual compression. Post procedural scanning was negative for significant post procedural hemorrhage or additional complication. A dressing was placed. The patient tolerated the procedure well without immediate post procedural complication. IMPRESSION: Technically successful ultrasound guided right renal biopsy. Electronically Signed   By: Jacqulynn Cadet M.D.   On: 12/26/2020 14:14       Discharge Exam: Vitals:   01/02/21 2033 01/03/21 1245  BP: 135/89 110/81  Pulse: 83 77  Resp: 20 16  Temp:  97.6 F (36.4 C)  SpO2: 97% 96%   General exam: Appears calm and comfortable  Respiratory system: Clear to auscultation. Respiratory effort normal. Cardiovascular system: S1 & S2 heard, RRR. No pedal edema. Gastrointestinal system: Abdomen is nondistended, soft and nontender. Normal bowel sounds heard. Central nervous system: Alert Extremities: Symmetric in appearance bilaterally  Skin: No rashes, lesions or ulcers on exposed skin    The results of significant diagnostics from this hospitalization (including imaging, microbiology, ancillary and laboratory) are listed below for reference.     Microbiology: Recent Results (from the past 240 hour(s))  SARS  CORONAVIRUS 2 (TAT 6-24 HRS) Nasopharyngeal Nasopharyngeal Swab     Status: None   Collection Time: 01/01/21  5:00 PM   Specimen: Nasopharyngeal Swab  Result Value Ref Range Status    SARS Coronavirus 2 NEGATIVE NEGATIVE Final    Comment: (NOTE) SARS-CoV-2 target nucleic acids are NOT DETECTED.  The SARS-CoV-2 RNA is generally detectable in upper and lower respiratory specimens during the acute phase of infection. Negative results do not preclude SARS-CoV-2 infection, do not rule out co-infections with other pathogens, and should not be used as the sole basis for treatment or other patient management decisions. Negative results must be combined with clinical observations, patient history, and epidemiological information. The expected result is Negative.  Fact Sheet for Patients: SugarRoll.be  Fact Sheet for Healthcare Providers: https://www.woods-mathews.com/  This test is not yet approved or cleared by the [place] FDA and  has been authorized for detection and/or diagnosis of SARS-CoV-2 by FDA under an Emergency Use Authorization (EUA). This EUA will remain  in effect (meaning this test can be used) for the duration of the COVID-19 declaration under Se ction 564(b)(1) of the Act, 21 U.S.C. section 360bbb-3(b)(1), unless the authorization is terminated or revoked sooner.  Performed at [facility], [place] [address], [place], [place] [address]      Labs: BNP (last 3 results) No results for input(s): BNP in the last 8760 hours. Basic Metabolic Panel: Recent Labs  Lab 12/28/20 0438 12/29/20 0432 12/30/20 0643  NA 142 141 137  K 3.9 3.8 3.9  CL 106 106 103  CO2 26 26 24   GLUCOSE 96 88 98  BUN 26* 27* 29*  CREATININE 2.39* 2.26* 1.86*  CALCIUM 9.0 8.9 8.8*  MG 2.0 2.1  --   PHOS  --   --  4.4   Liver Function Tests: Recent Labs  Lab 12/29/20 0432 12/30/20 0643  AST 31  --   ALT 82*  --   ALKPHOS 107  --   BILITOT 0.9  --   PROT 6.0*  --   ALBUMIN 2.9* 3.1*   No results for input(s): LIPASE, AMYLASE in the last 168 hours. No results for input(s): AMMONIA in the last 168  hours. CBC: Recent Labs  Lab 12/29/20 0432  WBC 7.3  HGB 11.3*  HCT 33.2*  MCV 91.5  PLT PLATELET CLUMPS NOTED ON SMEAR, UNABLE TO ESTIMATE   Cardiac Enzymes: No results for input(s): CKTOTAL, CKMB, CKMBINDEX, TROPONINI in the last 168 hours. BNP: Invalid input(s): POCBNP CBG: No results for input(s): GLUCAP in the last 168 hours. D-Dimer No results for input(s): DDIMER in the last 72 hours. Hgb A1c No results for input(s): HGBA1C in the last 72 hours. Lipid Profile No results for input(s): CHOL, HDL, LDLCALC, TRIG, CHOLHDL, LDLDIRECT in the last 72 hours. Thyroid function studies No results for input(s): TSH, T4TOTAL, T3FREE, THYROIDAB in the last 72 hours.  Invalid input(s): FREET3 Anemia work up No results for input(s): VITAMINB12, FOLATE, FERRITIN, TIBC, IRON, RETICCTPCT in the last 72 hours. Urinalysis    Component Value Date/Time   COLORURINE STRAW (A) 12/25/2020 1433   APPEARANCEUR CLEAR 12/25/2020 1433   LABSPEC 1.003 (L) 12/25/2020 1433   PHURINE 6.0 12/25/2020 1433   GLUCOSEU NEGATIVE 12/25/2020 1433   HGBUR SMALL (A) 12/25/2020 1433   BILIRUBINUR NEGATIVE 12/25/2020 1433   KETONESUR NEGATIVE 12/25/2020 1433   PROTEINUR NEGATIVE 12/25/2020 1433   NITRITE NEGATIVE 12/25/2020 1433   LEUKOCYTESUR TRACE (A) 12/25/2020 1433   Sepsis  Labs Invalid input(s): PROCALCITONIN,  WBC,  LACTICIDVEN Microbiology Recent Results (from the past 240 hour(s))  SARS CORONAVIRUS 2 (TAT 6-24 HRS) Nasopharyngeal Nasopharyngeal Swab     Status: None   Collection Time: 01/01/21  5:00 PM   Specimen: Nasopharyngeal Swab  Result Value Ref Range Status   SARS Coronavirus 2 NEGATIVE NEGATIVE Final    Comment: (NOTE) SARS-CoV-2 target nucleic acids are NOT DETECTED.  The SARS-CoV-2 RNA is generally detectable in upper and lower respiratory specimens during the acute phase of infection. Negative results do not preclude SARS-CoV-2 infection, do not rule out co-infections with other  pathogens, and should not be used as the sole basis for treatment or other patient management decisions. Negative results must be combined with clinical observations, patient history, and epidemiological information. The expected result is Negative.  Fact Sheet for Patients: SugarRoll.be  Fact Sheet for Healthcare Providers: https://www.woods-mathews.com/  This test is not yet approved or cleared by the [place] FDA and  has been authorized for detection and/or diagnosis of SARS-CoV-2 by FDA under an Emergency Use Authorization (EUA). This EUA will remain  in effect (meaning this test can be used) for the duration of the COVID-19 declaration under Se ction 564(b)(1) of the Act, 21 U.S.C. section 360bbb-3(b)(1), unless the authorization is terminated or revoked sooner.  Performed at [facility], [place] [address], [place], [place] [address]      Patient was seen and examined on the day of discharge and was found to be in stable condition. Time coordinating discharge: 45 minutes including assessment and coordination of care, as well as examination of the patient.   SIGNED:  Dessa Phi, DO [facility] 01/03/2021, 1:28 PM

## 2021-01-03 NOTE — Plan of Care (Signed)
  Problem: Education: Goal: Knowledge of General Education information will improve Description: Including pain rating scale, medication(s)/side effects and non-pharmacologic comfort measures Outcome: Completed/Met   Problem: Health Behavior/Discharge Planning: Goal: Ability to manage health-related needs will improve Outcome: Completed/Met   Problem: Clinical Measurements: Goal: Ability to maintain clinical measurements within normal limits will improve Outcome: Completed/Met Goal: Will remain free from infection Outcome: Completed/Met Goal: Diagnostic test results will improve Outcome: Completed/Met Goal: Respiratory complications will improve Outcome: Completed/Met Goal: Cardiovascular complication will be avoided Outcome: Completed/Met   Problem: Activity: Goal: Risk for activity intolerance will decrease Outcome: Completed/Met   Problem: Nutrition: Goal: Adequate nutrition will be maintained Outcome: Completed/Met   Problem: Coping: Goal: Level of anxiety will decrease Outcome: Completed/Met   Problem: Elimination: Goal: Will not experience complications related to bowel motility Outcome: Completed/Met Goal: Will not experience complications related to urinary retention Outcome: Completed/Met   Problem: Pain Managment: Goal: General experience of comfort will improve Outcome: Completed/Met   Problem: Safety: Goal: Ability to remain free from injury will improve Outcome: Completed/Met   Problem: Skin Integrity: Goal: Risk for impaired skin integrity will decrease Outcome: Completed/Met   Problem: Fluid Volume: Goal: Hemodynamic stability will improve Outcome: Completed/Met   Problem: Clinical Measurements: Goal: Diagnostic test results will improve Outcome: Completed/Met Goal: Signs and symptoms of infection will decrease Outcome: Completed/Met   Problem: Respiratory: Goal: Ability to maintain adequate ventilation will improve Outcome:  Completed/Met

## 2021-01-03 NOTE — Progress Notes (Addendum)
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 Patient does have baseline cognitive dysfunction due to metastatic brain disease and is only oriented to self and place. History was obtained from the patient's family who reported a fever of 101.4 F with increased breathing. In the ED, patient was noted to be slightly tachycardic and had mild hypotension. Laboratory data showed normal leukocyte count but lactate was elevated at 2.4 which trended up to 2.7. COVID-19 and influenza test was negative. Chest x-ray was reported as left lower lobe airspace opacity.  CT head showed stable but extensive hemorrhagic metastasis. Patient received 2 L of lactated ringer, Rocephin and Zithromax and was admitted to hospital for further evaluation and treatment.  During hospitalization, patient was noted to have elevated creatinine which started trending up despite IV fluids. He was more agitated and required one-to-one sitter and Ativan for agitation. Nephrology was consulted due to concern for nivolumab induced toxicity.IR guided biopsy was done on 12/26/2020 which showed tubular interstitial nephritis. Patient was continued on prednisone.   New events last 24 hours / Subjective: Patient sleeping comfortably, states "okay" when asked how he is doing.  Per sitter at bedside, no acute events reported overnight  Assessment & Plan:   Principal Problem:   Severe sepsis ([facility]) Active Problems:   Brain metastases ([facility])   Seizure ([facility])   Hypokalemia   Elevated LFTs   Malignant melanoma metastatic to brain ([facility])   Pneumonia   Malnutrition of moderate degree    Acute tubulointerstitial nephritis   Severe sepsis due to community-acquired pneumonia -Completed a 5-day course of Rocephin and Zithromax  Agitation, confusion with underlying cognitive deficit/impairment -Intermittently requiring Ativan and one-to-one sitter.Could not use Haldol due to history of seizures. Continue Ativan as needed for extreme agitation.  Metastatic melanoma-with extensive mets including brain -S/p craniotomy and resection of left occipital and left frontal lesion in 09/2020.CT head shows stable extensive hemorrhagic mets. Followed by Dr [name], oncology. Was on dexamethasone. Has been changed to prednisone as per nephrology and oncology recommendation in view of tubulointerstitialnephritis. Continue Keppra.   Acute kidney injury  -Baseline creatinine of around 0.6-0.7. Initially received IV fluids but his creatinine started trending up. Bladder scan did not show any residual urine volume. Nephrology was consulted due to possibility of nivolumab induced toxicity. Renal ultrasound was negative for acute findings. Biopsy was performed by interventional radiology on 4/12/2022with findings of acute tubulointerstitial nephritis.Dexamethasone has been changed to prednisone. Follow oncology and nephrology recommendation. Creatinine has started to trend down.With Dr. [name] nephrology. Patient might be stable for discharge on oral prednisone and he would follow the patient as outpatient.   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 Hepatitis panel was nonreactive  Generalized weakness/debility -Rec for long-term care, custodial care    DVT prophylaxis:  SCDs Start: 12/19/20 6301  Code Status: Partial code, DO NOT INTUBATE Family Communication: None at bedside; updated wife over the phone  Disposition Plan:  Status is: Inpatient  Remains inpatient appropriate  because:Unsafe d/c plan   Dispo: The patient is from: SNF  Anticipated d/c is to: LTC              Patient currently is medically stable to d/c.  Need placement.    Difficult to place patient No   Antimicrobials:  Anti-infectives (From admission, onward)   Start     Dose/Rate Route Frequency Ordered Stop   12/19/20 1800  cefTRIAXone (ROCEPHIN) 1 g in sodium chloride 0.9 % 100 mL IVPB        1 g 200 mL/hr over 30 Minutes Intravenous Every 24 hours 12/19/20 0715 12/22/20 2216   12/19/20 1500  azithromycin (ZITHROMAX) 500 mg in sodium chloride 0.9 % 250 mL IVPB        500 mg 250 mL/hr over 60 Minutes Intravenous Every 24 hours 12/19/20 1355 12/23/20 1755   12/18/20 2015  cefTRIAXone (ROCEPHIN) 1 g in sodium chloride 0.9 % 100 mL IVPB        1 g 200 mL/hr over 30 Minutes Intravenous  Once 12/18/20 2011 12/18/20 2125   12/18/20 2015  azithromycin (ZITHROMAX) 500 mg in sodium chloride 0.9 % 250 mL IVPB  Status:  Discontinued        500 mg 250 mL/hr over 60 Minutes Intravenous  Once 12/18/20 2011 12/19/20 1355       Objective: Vitals:   01/01/21 1959 01/02/21 1141 01/02/21 2033 01/02/21 2134  BP: (!) 129/97 112/86 135/89   Pulse: (!) 105 80 83   Resp: 18 16 20    Temp: 98.1 F (36.7 C) 97.6 F (36.4 C)    TempSrc: Oral Oral    SpO2: 96% 96% 97%   Weight:    74.5 kg  Height:        Intake/Output Summary (Last 24 hours) at 01/03/2021 0939 Last data filed at 01/03/2021 0600 Gross per 24 hour  Intake 150 ml  Output 1750 ml  Net -1600 ml   Filed Weights   12/18/20 2230 12/25/20 1400 01/02/21 2134  Weight: 79.3 kg 78.6 kg 74.5 kg    Examination: General exam: Appears calm and comfortable  Respiratory system: Clear to auscultation. Respiratory effort normal. Cardiovascular system: S1 & S2 heard, RRR. No pedal edema. Gastrointestinal system: Abdomen is nondistended, soft and nontender. Normal bowel sounds heard. Central nervous system: Alert Extremities:  Symmetric in appearance bilaterally  Skin: No rashes, lesions or ulcers on exposed skin   Data Reviewed: I have personally reviewed following labs and imaging studies  CBC: Recent Labs  Lab 12/29/20 0432  WBC 7.3  HGB 11.3*  HCT 33.2*  MCV 91.5  PLT PLATELET CLUMPS NOTED ON SMEAR, UNABLE TO ESTIMATE   Basic Metabolic Panel: Recent Labs  Lab 12/28/20 0438 12/29/20 0432 12/30/20 0643  NA 142 141 137  K 3.9 3.8 3.9  CL 106 106 103  CO2 26 26 24   GLUCOSE 96 88 98  BUN 26* 27* 29*  CREATININE 2.39* 2.26* 1.86*  CALCIUM 9.0 8.9 8.8*  MG 2.0 2.1  --   PHOS  --   --  4.4   GFR: Estimated Creatinine Clearance: 46.7 mL/min (A) (by C-G formula based on SCr of 1.86 mg/dL (H)). Liver Function Tests: Recent Labs  Lab 12/29/20 0432 12/30/20 0643  AST 31  --   ALT 82*  --   ALKPHOS 107  --   BILITOT 0.9  --   PROT 6.0*  --   ALBUMIN 2.9* 3.1*   No results for input(s): LIPASE, AMYLASE in the last 168 hours. No results for input(s): AMMONIA in  the last 168 hours. Coagulation Profile: No results for input(s): INR, PROTIME in the last 168 hours. Cardiac Enzymes: No results for input(s): CKTOTAL, CKMB, CKMBINDEX, TROPONINI in the last 168 hours. BNP (last 3 results) No results for input(s): PROBNP in the last 8760 hours. HbA1C: No results for input(s): HGBA1C in the last 72 hours. CBG: No results for input(s): GLUCAP in the last 168 hours. Lipid Profile: No results for input(s): CHOL, HDL, LDLCALC, TRIG, CHOLHDL, LDLDIRECT in the last 72 hours. Thyroid Function Tests: No results for input(s): TSH, T4TOTAL, FREET4, T3FREE, THYROIDAB in the last 72 hours. Anemia Panel: No results for input(s): VITAMINB12, FOLATE, FERRITIN, TIBC, IRON, RETICCTPCT in the last 72 hours. Sepsis Labs: No results for input(s): PROCALCITON, LATICACIDVEN in the last 168 hours.  Recent Results (from the past 240 hour(s))  MRSA PCR Screening     Status: None   Collection Time: 12/24/20 10:07 AM    Specimen: Nasal Mucosa; Nasopharyngeal  Result Value Ref Range Status   MRSA by PCR NEGATIVE NEGATIVE Final    Comment:        The GeneXpert MRSA Assay (FDA approved for NASAL specimens only), is one component of a comprehensive MRSA colonization surveillance program. It is not intended to diagnose MRSA infection nor to guide or monitor treatment for MRSA infections. Performed at [facility], [place] [address], [place], [place] [address]   SARS CORONAVIRUS 2 (TAT 6-24 HRS) Nasopharyngeal Nasopharyngeal Swab     Status: None   Collection Time: 01/01/21  5:00 PM   Specimen: Nasopharyngeal Swab  Result Value Ref Range Status   SARS Coronavirus 2 NEGATIVE NEGATIVE Final    Comment: (NOTE) SARS-CoV-2 target nucleic acids are NOT DETECTED.  The SARS-CoV-2 RNA is generally detectable in upper and lower respiratory specimens during the acute phase of infection. Negative results do not preclude SARS-CoV-2 infection, do not rule out co-infections with other pathogens, and should not be used as the sole basis for treatment or other patient management decisions. Negative results must be combined with clinical observations, patient history, and epidemiological information. The expected result is Negative.  Fact Sheet for Patients: SugarRoll.be  Fact Sheet for Healthcare Providers: https://www.woods-mathews.com/  This test is not yet approved or cleared by the [place] FDA and  has been authorized for detection and/or diagnosis of SARS-CoV-2 by FDA under an Emergency Use Authorization (EUA). This EUA will remain  in effect (meaning this test can be used) for the duration of the COVID-19 declaration under Se ction 564(b)(1) of the Act, 21 U.S.C. section 360bbb-3(b)(1), unless the authorization is terminated or revoked sooner.  Performed at [facility], [place] [address] [place], [place] [address]        Radiology Studies: No results found.    Scheduled Meds: . (feeding supplement) PROSource Plus  30 mL Oral Daily  . citalopram  10 mg Oral QHS  . famotidine  20 mg Oral Daily  . feeding supplement  1 Container Oral Q24H  . feeding supplement  237 mL Oral BID BM  . levETIRAcetam  500 mg Oral BID  . multivitamin with minerals  1 tablet Oral Daily  . predniSONE  40 mg Oral Q breakfast  . senna-docusate  2 tablet Oral QHS   Continuous Infusions:   LOS: 15 days      Time spent: 15 minutes   Dessa Phi, DO [facility] 01/03/2021, 9:39 AM   Available via Epic secure chat 7am-7pm After these hours, please refer  to coverage provider listed on [email or web address]

## 2021-01-09 ENCOUNTER — Telehealth: Payer: Self-pay

## 2021-01-09 ENCOUNTER — Telehealth: Payer: Self-pay | Admitting: *Deleted

## 2021-01-09 NOTE — Telephone Encounter (Signed)
This nurse calls and speaks with [facility] and receives in phone call that pt  has not been acutely ill in last week pt is ambulatory does exhibit some negative behaviors with wandering and disrobing this nurse learns pt VSS he does have poor intake food/fluids but this is not uncommon for this pt  On further speaking with wife this nurse learns she is extremely emotional and feeling as though she has lost control of her life she expressed feeling of betrayal and emotional instability. This nurse calms wife advises her seek out social worker at facility as well as resourcing the local assigned Ombudsman wife will be contacting this nurse tomorrow to offer update  At end of call wife states she feels much better nothing further call ended

## 2021-01-09 NOTE — Telephone Encounter (Signed)
Patients wife called needing assistance.  Patient is a Dr [name] patient and Dr Mickeal Skinner consulted for brain mets.  Wife states that she left message for Dr Gearldine Shown office pending call back.  Wife states he is scheduled for treatment next week and wasn't sure if he will be alright until then.  Patient is currently at [facility] and is having vomiting since last Wednesday with no oral intake of food, increasing dehydration.    Reached out to Dr Gearldine Shown nurse to see if she can assist in any way.

## 2021-01-13 ENCOUNTER — Other Ambulatory Visit: Payer: Self-pay

## 2021-01-13 ENCOUNTER — Emergency Department (HOSPITAL_COMMUNITY): Payer: 59

## 2021-01-13 ENCOUNTER — Inpatient Hospital Stay (HOSPITAL_COMMUNITY)
Admission: EM | Admit: 2021-01-13 | Discharge: 2021-01-29 | DRG: 871 | Disposition: A | Discharge status: Hospice | Payer: 59 | Source: Skilled Nursing Facility | Attending: Family Medicine | Admitting: Family Medicine

## 2021-01-13 ENCOUNTER — Encounter (HOSPITAL_COMMUNITY): Payer: Self-pay | Admitting: Emergency Medicine

## 2021-01-13 DIAGNOSIS — C787 Secondary malignant neoplasm of liver and intrahepatic bile duct: Secondary | ICD-10-CM | POA: Diagnosis present

## 2021-01-13 DIAGNOSIS — T17908A Unspecified foreign body in respiratory tract, part unspecified causing other injury, initial encounter: Secondary | ICD-10-CM

## 2021-01-13 DIAGNOSIS — C439 Malignant melanoma of skin, unspecified: Secondary | ICD-10-CM | POA: Diagnosis present

## 2021-01-13 DIAGNOSIS — E785 Hyperlipidemia, unspecified: Secondary | ICD-10-CM | POA: Diagnosis present

## 2021-01-13 DIAGNOSIS — A419 Sepsis, unspecified organism: Principal | ICD-10-CM

## 2021-01-13 DIAGNOSIS — Z66 Do not resuscitate: Secondary | ICD-10-CM | POA: Diagnosis present

## 2021-01-13 DIAGNOSIS — E860 Dehydration: Secondary | ICD-10-CM | POA: Diagnosis present

## 2021-01-13 DIAGNOSIS — N179 Acute kidney failure, unspecified: Secondary | ICD-10-CM | POA: Diagnosis present

## 2021-01-13 DIAGNOSIS — Z87891 Personal history of nicotine dependence: Secondary | ICD-10-CM

## 2021-01-13 DIAGNOSIS — R569 Unspecified convulsions: Secondary | ICD-10-CM

## 2021-01-13 DIAGNOSIS — G9341 Metabolic encephalopathy: Secondary | ICD-10-CM | POA: Diagnosis present

## 2021-01-13 DIAGNOSIS — Z20822 Contact with and (suspected) exposure to covid-19: Secondary | ICD-10-CM | POA: Diagnosis present

## 2021-01-13 DIAGNOSIS — R6521 Severe sepsis with septic shock: Secondary | ICD-10-CM | POA: Diagnosis present

## 2021-01-13 DIAGNOSIS — G936 Cerebral edema: Secondary | ICD-10-CM | POA: Diagnosis present

## 2021-01-13 DIAGNOSIS — D638 Anemia in other chronic diseases classified elsewhere: Secondary | ICD-10-CM | POA: Diagnosis present

## 2021-01-13 DIAGNOSIS — E43 Unspecified severe protein-calorie malnutrition: Secondary | ICD-10-CM | POA: Diagnosis present

## 2021-01-13 DIAGNOSIS — J189 Pneumonia, unspecified organism: Secondary | ICD-10-CM | POA: Diagnosis present

## 2021-01-13 DIAGNOSIS — C7802 Secondary malignant neoplasm of left lung: Secondary | ICD-10-CM | POA: Diagnosis present

## 2021-01-13 DIAGNOSIS — C7931 Secondary malignant neoplasm of brain: Secondary | ICD-10-CM | POA: Diagnosis present

## 2021-01-13 DIAGNOSIS — E876 Hypokalemia: Secondary | ICD-10-CM | POA: Diagnosis present

## 2021-01-13 DIAGNOSIS — Z781 Physical restraint status: Secondary | ICD-10-CM

## 2021-01-13 DIAGNOSIS — C7801 Secondary malignant neoplasm of right lung: Secondary | ICD-10-CM | POA: Diagnosis present

## 2021-01-13 DIAGNOSIS — Z515 Encounter for palliative care: Secondary | ICD-10-CM

## 2021-01-13 DIAGNOSIS — Z6823 Body mass index (BMI) 23.0-23.9, adult: Secondary | ICD-10-CM

## 2021-01-13 DIAGNOSIS — Z9221 Personal history of antineoplastic chemotherapy: Secondary | ICD-10-CM

## 2021-01-13 DIAGNOSIS — N10 Acute tubulo-interstitial nephritis: Secondary | ICD-10-CM

## 2021-01-13 LAB — COMPREHENSIVE METABOLIC PANEL
ALT: 40 U/L (ref 0–44)
AST: 35 U/L (ref 15–41)
Albumin: 3.2 g/dL — ABNORMAL LOW (ref 3.5–5.0)
Alkaline Phosphatase: 101 U/L (ref 38–126)
Anion gap: 14 (ref 5–15)
BUN: 26 mg/dL — ABNORMAL HIGH (ref 6–20)
CO2: 22 mmol/L (ref 22–32)
Calcium: 8.7 mg/dL — ABNORMAL LOW (ref 8.9–10.3)
Chloride: 97 mmol/L — ABNORMAL LOW (ref 98–111)
Creatinine, Ser: 2.72 mg/dL — ABNORMAL HIGH (ref 0.61–1.24)
GFR, Estimated: 27 mL/min — ABNORMAL LOW (ref >60)
Glucose, Bld: 148 mg/dL — ABNORMAL HIGH (ref 70–99)
Potassium: 2.7 mmol/L — CL (ref 3.5–5.1)
Sodium: 133 mmol/L — ABNORMAL LOW (ref 135–145)
Total Bilirubin: 1.8 mg/dL — ABNORMAL HIGH (ref 0.3–1.2)
Total Protein: 6.6 g/dL (ref 6.5–8.1)

## 2021-01-13 LAB — URINALYSIS, ROUTINE W REFLEX MICROSCOPIC
Bilirubin Urine: NEGATIVE
Glucose, UA: NEGATIVE mg/dL
Hgb urine dipstick: NEGATIVE
Ketones, ur: 20 mg/dL — AB
Leukocytes,Ua: NEGATIVE
Nitrite: NEGATIVE
Protein, ur: NEGATIVE mg/dL
Specific Gravity, Urine: 1.011 (ref 1.005–1.030)
pH: 5 (ref 5.0–8.0)

## 2021-01-13 LAB — CBC WITH DIFFERENTIAL/PLATELET
Abs Immature Granulocytes: 0.17 10*3/uL — ABNORMAL HIGH (ref 0.00–0.07)
Basophils Absolute: 0.1 10*3/uL (ref 0.0–0.1)
Basophils Relative: 0 %
Eosinophils Absolute: 0 10*3/uL (ref 0.0–0.5)
Eosinophils Relative: 0 %
HCT: 36.6 % — ABNORMAL LOW (ref 39.0–52.0)
Hemoglobin: 13 g/dL (ref 13.0–17.0)
Immature Granulocytes: 1 %
Lymphocytes Relative: 3 %
Lymphs Abs: 0.6 10*3/uL — ABNORMAL LOW (ref 0.7–4.0)
MCH: 30.8 pg (ref 26.0–34.0)
MCHC: 35.5 g/dL (ref 30.0–36.0)
MCV: 86.7 fL (ref 80.0–100.0)
Monocytes Absolute: 1.1 10*3/uL — ABNORMAL HIGH (ref 0.1–1.0)
Monocytes Relative: 6 %
Neutro Abs: 15.1 10*3/uL — ABNORMAL HIGH (ref 1.7–7.7)
Neutrophils Relative %: 90 %
Platelets: 267 10*3/uL (ref 150–400)
RBC: 4.22 MIL/uL (ref 4.22–5.81)
RDW: 12.5 % (ref 11.5–15.5)
WBC: 17 10*3/uL — ABNORMAL HIGH (ref 4.0–10.5)
nRBC: 0 % (ref 0.0–0.2)

## 2021-01-13 LAB — RESP PANEL BY RT-PCR (FLU A&B, COVID) ARPGX2
Influenza A by PCR: NEGATIVE
Influenza B by PCR: NEGATIVE
SARS Coronavirus 2 by RT PCR: NEGATIVE

## 2021-01-13 LAB — PROTIME-INR
INR: 1 (ref 0.8–1.2)
Prothrombin Time: 13.7 seconds (ref 11.4–15.2)

## 2021-01-13 LAB — APTT: aPTT: 35 seconds (ref 24–36)

## 2021-01-13 LAB — LACTIC ACID, PLASMA: Lactic Acid, Venous: 1.6 mmol/L (ref 0.5–1.9)

## 2021-01-13 IMAGING — DX DG CHEST 1V PORT
1 series · 1 of 1 positions shown · non-contrast
Comparison: [DATE]

CLINICAL DATA: Unresponsive

EXAM:
PORTABLE CHEST 1 VIEW

[chest ap]
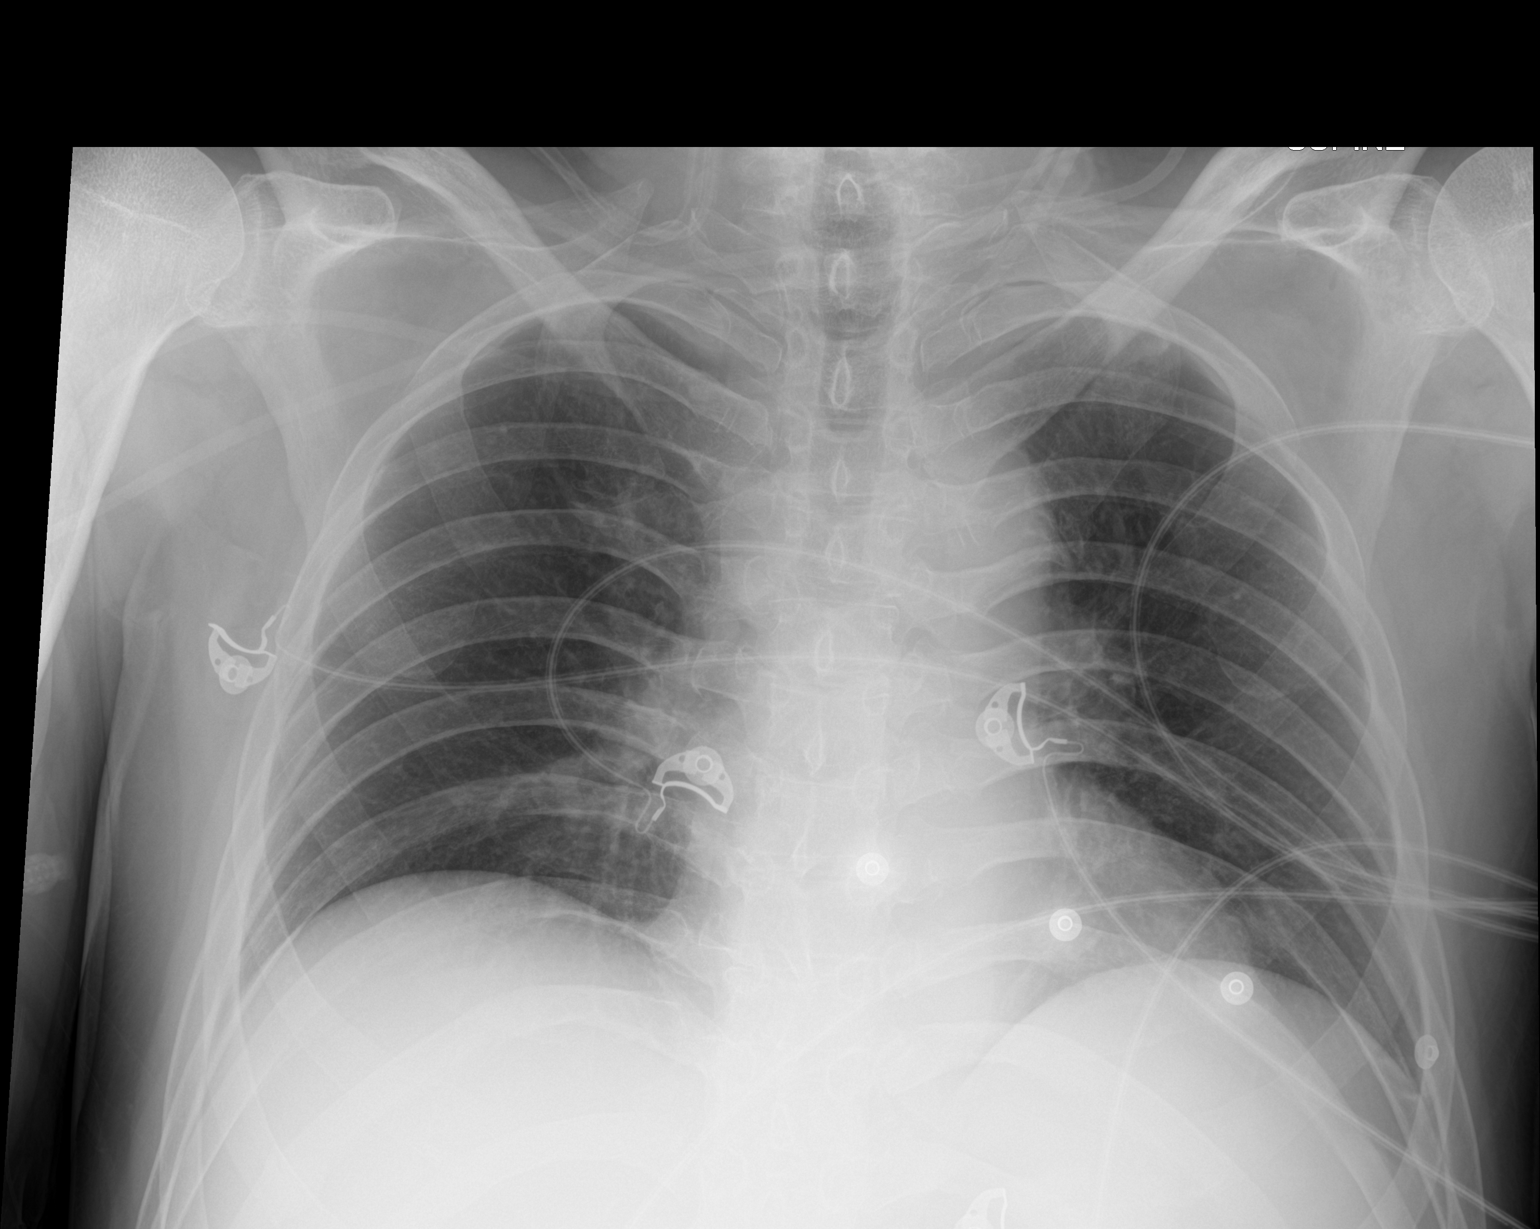

[1 of 1 positions shown; findings below may reference images not displayed]

FINDINGS: The heart size and mediastinal contours are within normal limits.
Both lungs are clear. The visualized skeletal structures are
unremarkable. The lung volumes are low. These low lung volumes
likely accentuate the upper mediastinum.
IMPRESSION: Low lung volumes.  Otherwise, stable exam.

## 2021-01-13 IMAGING — CT CT HEAD W/O CM
3 series · 15 of 47 positions shown, 18 images · non-contrast
Comparison: Recent CT from [DATE].

CLINICAL DATA: Initial evaluation for acute delirium.

EXAM:
CT HEAD WITHOUT CONTRAST
TECHNIQUE: Contiguous axial images were obtained from the base of the skull
through the vertex without intravenous contrast.

[Series 3: head wo · axial · 0.44mm/px · z∈[-119,+16]mm · 9 of 33 slices shown, 12 images]
[im 3/33  brain]
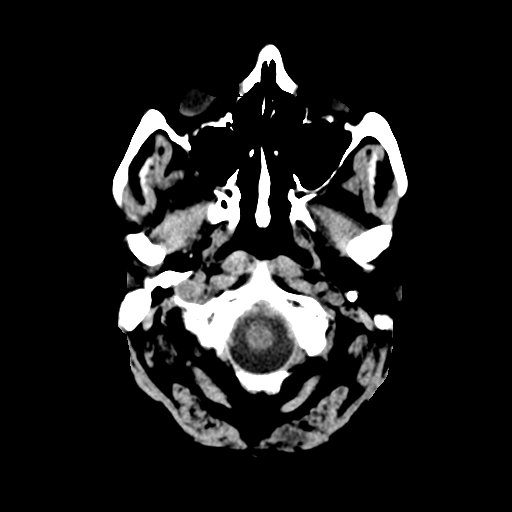
[im 3/33  bone]
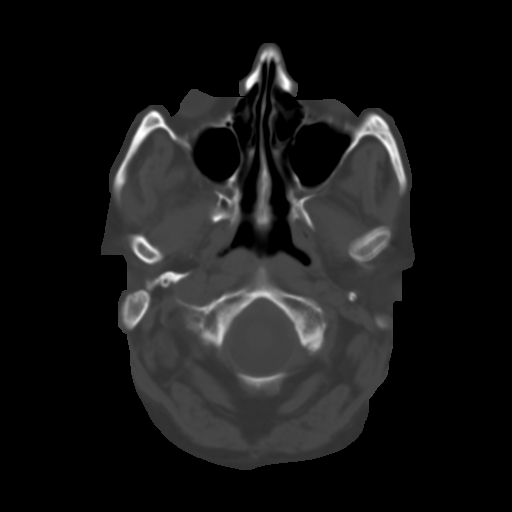
[im 6/33  brain]
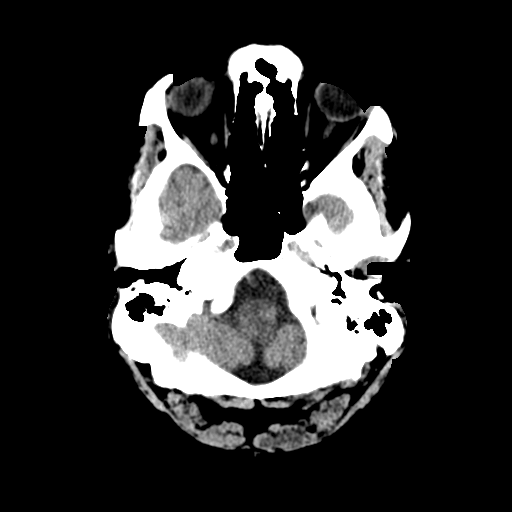
[im 9/33  brain]
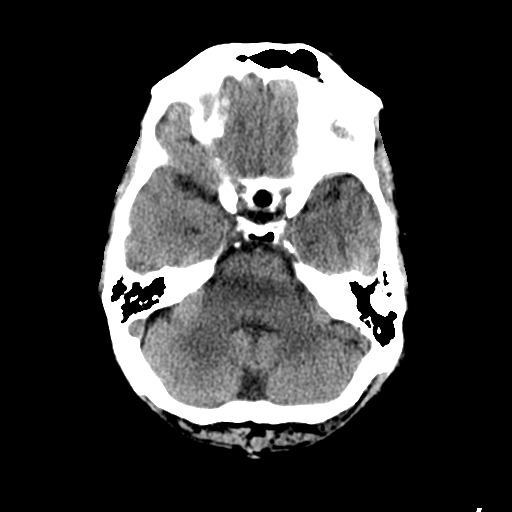
[im 13/33  brain]
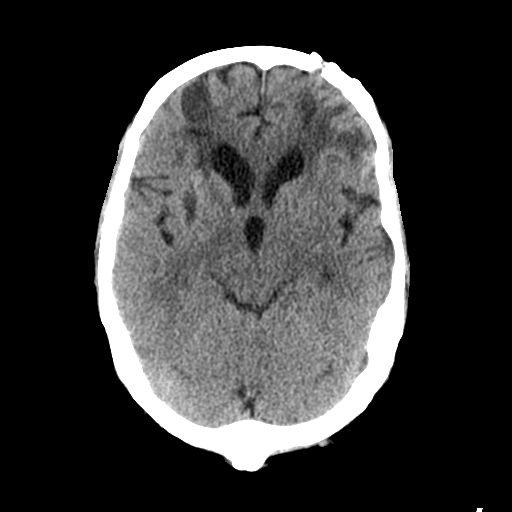
[im 17/33  brain]
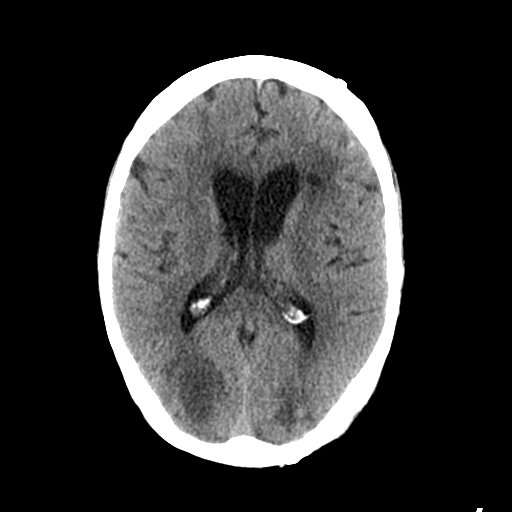
[im 17/33  bone]
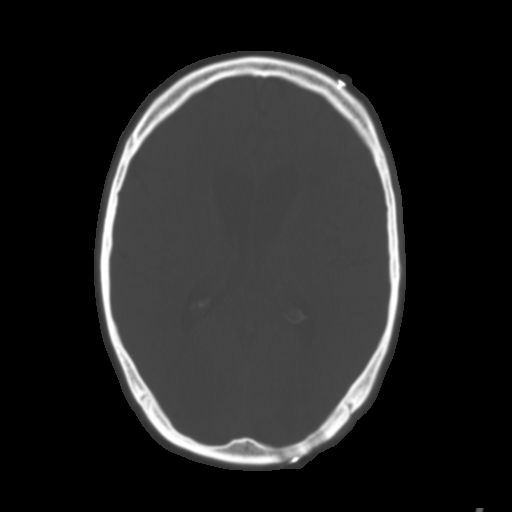
[im 20/33  brain]
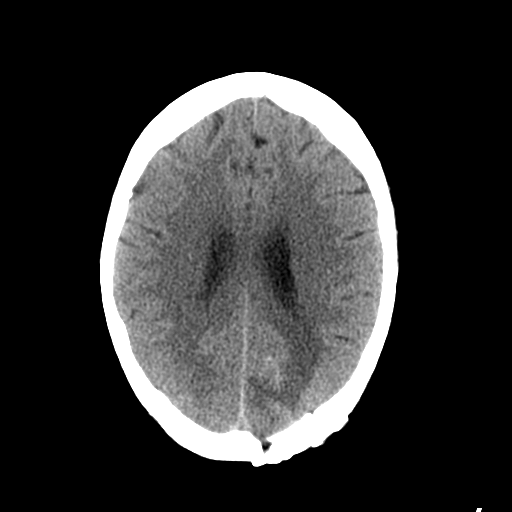
[im 24/33  brain]
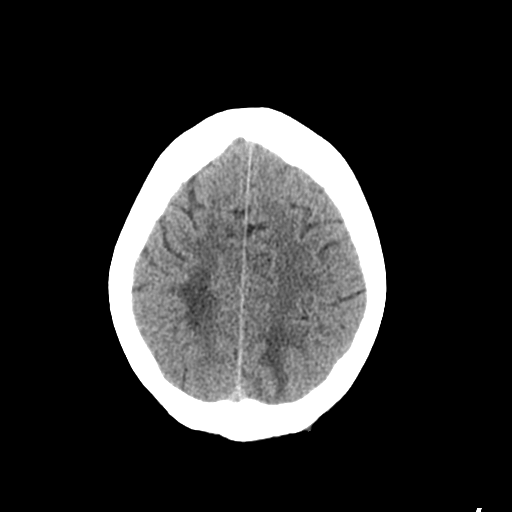
[im 27/33  brain]
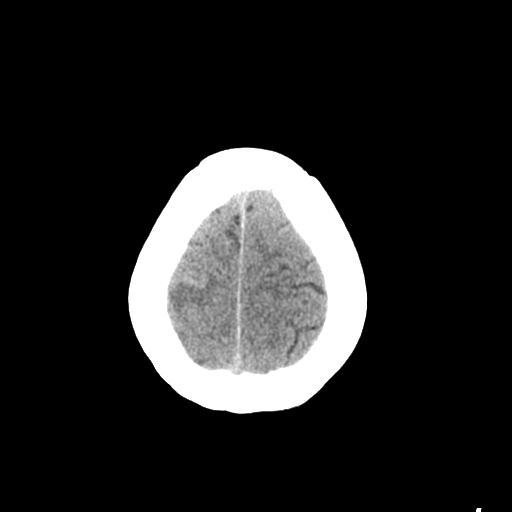
[im 30/33  brain]
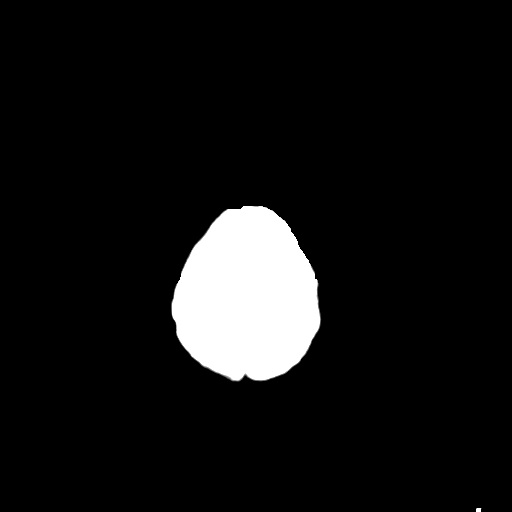
[im 30/33  bone]
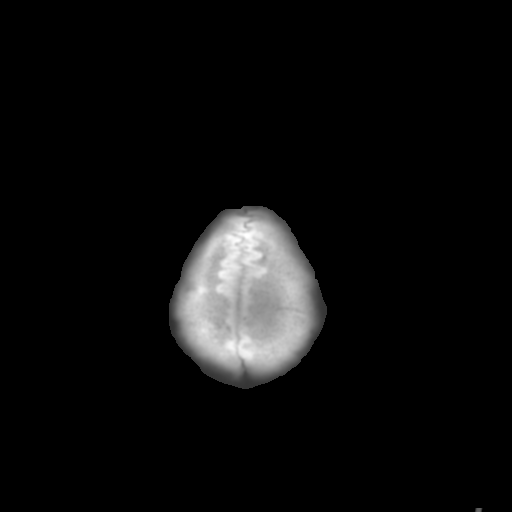

[Series 5: coronal soft tissue · coronal · 0.31mm/px · 3 of 72 slices shown]
[im 24/72  brain]
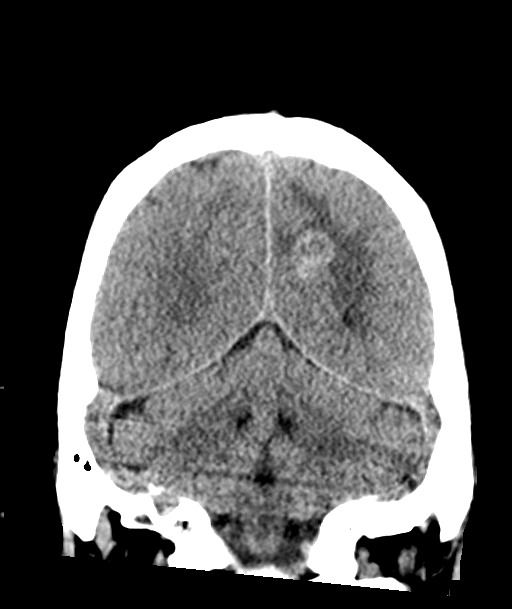
[im 32/72  brain]
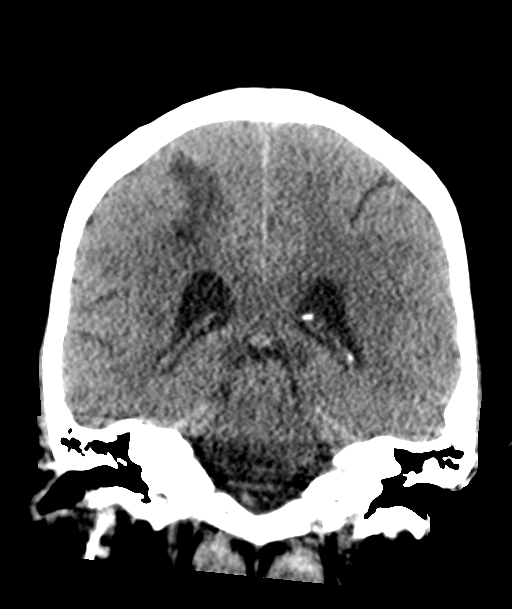
[im 40/72  brain]
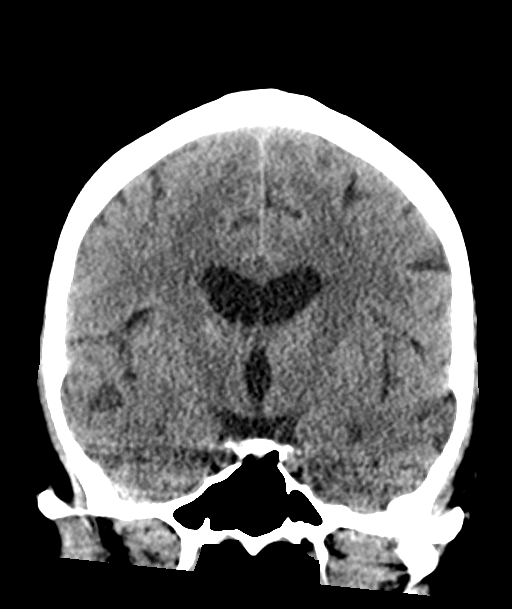

[Series 6: sagittal soft tissue · sagittal · 0.37mm/px · 3 of 54 slices shown]
[im 18/54  brain]
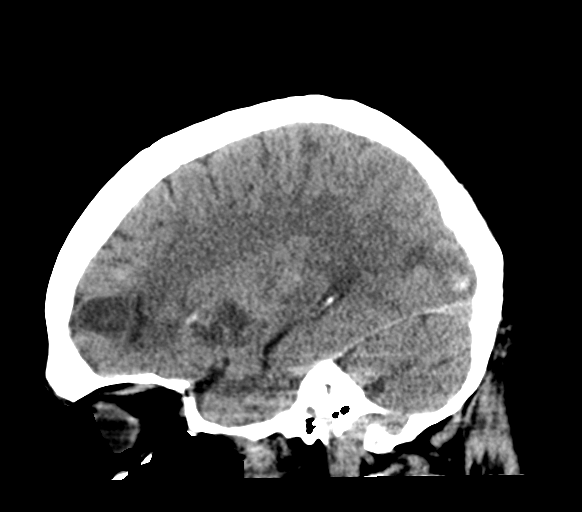
[im 27/54  brain]
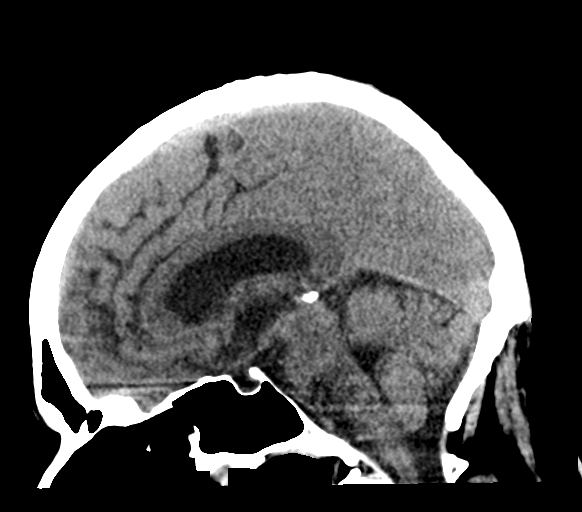
[im 36/54  brain]
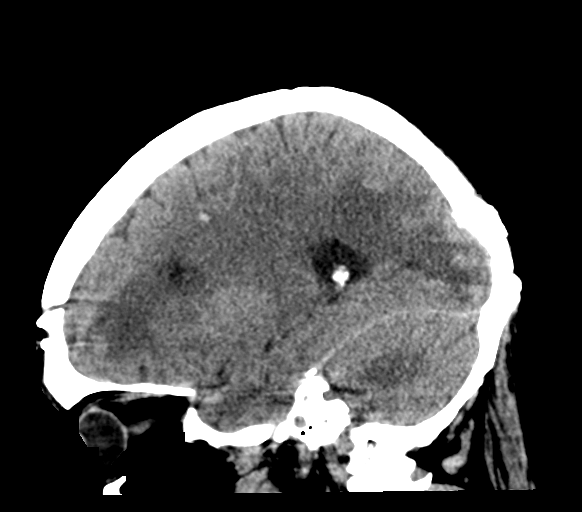

[15 of 47 positions shown; findings below may reference images not displayed]

FINDINGS: Brain: Again seen are multiple hemorrhagic metastases scattered
throughout both cerebral hemispheres. Overall, lesions are not
appreciably changed or progressed as compared to most recent CT.
Most prominent of these lesions positioned at the left parietal lobe
in measures approximately 1.8 cm. Localized vasogenic edema about
the preponderance of these lesions without midline shift or
significant regional mass effect. No evidence for new/interval
hemorrhage by CT. No appreciable infratentorial involvement.

No acute large vessel territory infarct. Ventricles stable in size
without hydrocephalus. No extra-axial fluid collection.

Vascular: No hyperdense vessel.

Skull: Scalp soft tissues demonstrate no acute finding. Post
craniotomy RINNAH is noted at the left calvarium.

Sinuses/Orbits: Globes and orbital soft tissues demonstrate no acute
finding. Visualized paranasal sinuses are clear. No mastoid
effusion.

Other: None.
IMPRESSION: 1. No significant interval change in size and appearance of multiple
hemorrhagic metastases scattered throughout both cerebral
hemispheres, with similar localized vasogenic edema. No midline
shift or significant regional mass effect.
2. No other acute intracranial abnormality.

## 2021-01-13 MED ORDER — LACTATED RINGERS IV BOLUS
1000.0000 mL | Freq: Once | INTRAVENOUS | Status: AC
  Administered 2021-01-13: 1000 mL via INTRAVENOUS

## 2021-01-13 MED ORDER — VANCOMYCIN HCL 1500 MG/300ML IV SOLN
1500.0000 mg | Freq: Once | INTRAVENOUS | Status: AC
  Administered 2021-01-13: 1500 mg via INTRAVENOUS
  Filled 2021-01-13: qty 300

## 2021-01-13 MED ORDER — VANCOMYCIN HCL IN DEXTROSE 1-5 GM/200ML-% IV SOLN: 1000.0000 mg | Freq: Once | INTRAVENOUS | Status: DC

## 2021-01-13 MED ORDER — LACTATED RINGERS IV BOLUS (SEPSIS)
1000.0000 mL | Freq: Once | INTRAVENOUS | Status: AC
  Administered 2021-01-13: 1000 mL via INTRAVENOUS

## 2021-01-13 MED ORDER — METRONIDAZOLE 500 MG/100ML IV SOLN
500.0000 mg | Freq: Once | INTRAVENOUS | Status: AC
  Administered 2021-01-13: 500 mg via INTRAVENOUS
  Filled 2021-01-13: qty 100

## 2021-01-13 MED ORDER — SODIUM CHLORIDE 0.9 % IV SOLN
2.0000 g | Freq: Once | INTRAVENOUS | Status: AC
  Administered 2021-01-13: 2 g via INTRAVENOUS
  Filled 2021-01-13: qty 2

## 2021-01-13 MED ORDER — LACTATED RINGERS IV BOLUS (SEPSIS)
500.0000 mL | Freq: Once | INTRAVENOUS | Status: AC
  Administered 2021-01-13: 500 mL via INTRAVENOUS

## 2021-01-13 MED ORDER — LACTATED RINGERS IV SOLN: INTRAVENOUS | Status: DC

## 2021-01-13 NOTE — ED Triage Notes (Signed)
Patient from [facility] via EMS. Reports unresponsive upon arrival. BP 80 systolic. Last seen normal at approximately 1800. 565ml NS administered via IO.

## 2021-01-13 NOTE — Sepsis Progress Note (Signed)
Following for Code Sepsis  

## 2021-01-13 NOTE — Progress Notes (Signed)
A consult was received from an ED physician for Vancomycin & Cefepime per pharmacy dosing.  The patient's profile has been reviewed for ht/wt/allergies/indication/available labs.   A one time order has been placed for Vancomycin 1500mg  + Cefepime 2gm IV.  Further antibiotics/pharmacy consults should be ordered by admitting physician if indicated.                       Thank you, Netta Cedars PharmD 01/13/2021  10:06 PM

## 2021-01-13 NOTE — ED Provider Notes (Signed)
[facility] DEPT Provider Note   CSN: [id number] Arrival date & time: 01/13/21  2137     History Chief Complaint  Patient presents with  . Altered Mental Status    Sean [name] is a 55 y.o. male.  55 yo M with a chief complaints of altered mental status.  Thought to be acutely altered by his skilled nursing facility.  Found to be hypotensive by EMS and brought here for evaluation.  Patient opens his eyes but is nonverbal on arrival.  Level 5 caveat.   Altered Mental Status      Past Medical History:  Diagnosis Date  . Brain lesion 09/14/2020  . 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 Hyperlipidemia   . Liver lesion 09/14/2020    Patient Active Problem List   Diagnosis Date Noted  . 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 Malnutrition of moderate degree 12/21/2020  . Severe sepsis ([place]) 12/19/2020  . Pneumonia 12/18/2020  . Malignant melanoma ([place]) 10/26/2020  . Brain metastasis ([place]) 10/20/2020  . Melanoma of skin ([place]) 10/05/2020  . Status post craniotomy 09/29/2020  . Brain tumor ([place]) 09/29/2020  . Elevated blood pressure reading 09/15/2020  . 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 Uncomplicated alcohol dependence ([place])   . Seizure ([place])   . Hypokalemia   . Hyponatremia   . Elevated LFTs   . Brain metastases ([place]) 09/14/2020    Past Surgical History:  Procedure Laterality Date  . APPLICATION OF CRANIAL NAVIGATION  09/29/2020   Procedure: APPLICATION OF CRANIAL NAVIGATION;  Surgeon: Judith Part, MD;  Location: [facility];  Service: Neurosurgery;;  . Kyla Balzarine Left 09/29/2020   Procedure: Left craniotomy for tumor resection with brainlab;  Surgeon: Judith Part, MD;  Location: [facility];  Service: Neurosurgery;  Laterality: Left;  . HERNIA REPAIR     left side inguinal hernia about 18-20 years       Family History  Problem Relation Age of Onset  . Osteoarthritis Mother   .  Hypertension Father   . Colon polyps Father   . Colon polyps Brother   . Colon polyps Brother   . Colon cancer Neg Hx   . Stomach cancer Neg Hx   . Rectal cancer Neg Hx   . Esophageal cancer Neg Hx   . Liver cancer Neg Hx     Social History   Tobacco Use  . Smoking status: Former Research scientist (life sciences)  . Smokeless tobacco: Never Used  . Tobacco comment: 09/27/20: per patient quit about over 20 years ago   Vaping Use  . Vaping Use: Never used  Substance Use Topics  . Alcohol use: Not Currently  . Drug use: No    Home Medications Prior to Admission medications   Medication Sig Start Date End Date Taking? Authorizing Provider  acetaminophen (TYLENOL) 325 MG tablet Take 2 tablets (650 mg total) by mouth every 6 (six) hours as needed for mild pain (or Fever >/= 101). 11/08/20   Angiulli, Lavon Paganini, PA-C  citalopram (CELEXA) 10 MG tablet Take 1 tablet (10 mg total) by mouth at bedtime. 11/08/20   Angiulli, Lavon Paganini, PA-C  levETIRAcetam (KEPPRA) 500 MG tablet Take 1 tablet (500 mg total) by mouth 2 (two) times daily. 11/08/20   Angiulli, Lavon Paganini, PA-C  LORazepam (ATIVAN) 1 MG tablet Take 1 tablet (1 mg total) by mouth 2 (two) times daily as needed for anxiety. 01/03/21 02-03-21  Dessa Phi, DO  melatonin 5 MG TABS Take 1 tablet (5 mg total) by mouth at bedtime as needed. 11/08/20   Angiulli, Lavon Paganini, PA-C  Multiple Vitamin (MULTIVITAMIN WITH MINERALS) TABS tablet Take 1 tablet by mouth daily. Patient taking differently: Take 2 tablets by mouth daily. gummy 09/19/20   Geradine Girt, DO  pantoprazole (PROTONIX) 40 MG tablet Take 1 tablet (40 mg total) by mouth daily. 11/08/20   Angiulli, Lavon Paganini, PA-C  predniSONE (DELTASONE) 20 MG tablet Take 2 tablets (40 mg total) by mouth daily with breakfast. 01/04/21   Dessa Phi, DO  senna-docusate (SENOKOT-S) 8.6-50 MG tablet Take 2 tablets by mouth at bedtime. 11/08/20   Angiulli, Lavon Paganini, PA-C    Allergies    Patient has no known allergies.  Review of  Systems   Review of Systems  Unable to perform ROS: Mental status change    Physical Exam Updated Vital Signs BP (!) 86/55   Pulse (!) 118   Temp (!) 102.2 F (39 C) (Rectal)   Resp (!) 23   Ht 6' (1.829 m)   Wt 77.1 kg   SpO2 100%   BMI 23.06 kg/m   Physical Exam Vitals and nursing note reviewed.  Constitutional:      Appearance: He is well-developed.  HENT:     Head: Normocephalic and atraumatic.  Eyes:     Pupils: Pupils are equal, round, and reactive to light.  Neck:     Vascular: No JVD.  Cardiovascular:     Rate and Rhythm: Normal rate and regular rhythm.     Heart sounds: No murmur heard. No friction rub. No gallop.   Pulmonary:     Effort: No respiratory distress.     Breath sounds: No wheezing.  Abdominal:     General: There is no distension.     Tenderness: There is no guarding or rebound.  Musculoskeletal:        General: Normal range of motion.     Cervical back: Normal range of motion and neck supple.  Skin:    Coloration: Skin is not pale.     Findings: No rash.  Neurological:     Mental Status: He is alert.     Comments: Opens his eyes to voice.     ED Results / Procedures / Treatments   Labs (all labs ordered are listed, but only abnormal results are displayed) Labs Reviewed  COMPREHENSIVE METABOLIC PANEL - Abnormal; Notable for the following components:      Result Value   Sodium 133 (*)    Potassium 2.7 (*)    Chloride 97 (*)    Glucose, Bld 148 (*)    BUN 26 (*)    Creatinine, Ser 2.72 (*)    Calcium 8.7 (*)    Albumin 3.2 (*)    Total Bilirubin 1.8 (*)    GFR, Estimated 27 (*)    All other components within normal limits  CBC WITH DIFFERENTIAL/PLATELET - Abnormal; Notable for the following components:   WBC 17.0 (*)    HCT 36.6 (*)    Neutro Abs 15.1 (*)    Lymphs Abs 0.6 (*)    Monocytes Absolute 1.1 (*)    Abs Immature Granulocytes 0.17 (*)    All other components within normal limits  RESP PANEL BY RT-PCR (FLU A&B,  COVID) ARPGX2  CULTURE, BLOOD (ROUTINE X 2)  CULTURE, BLOOD (ROUTINE X 2)  URINE CULTURE  LACTIC ACID, PLASMA  PROTIME-INR  APTT  LACTIC ACID, PLASMA  URINALYSIS, ROUTINE W REFLEX MICROSCOPIC    EKG EKG Interpretation  Date/Time:  Saturday January 13 2021 22:09:53 EDT Ventricular Rate:  123 PR Interval:  122 QRS Duration: 89 QT Interval:  334 QTC Calculation: 478 R Axis:   57 Text Interpretation: Sinus tachycardia Ventricular premature complex Aberrant complex Probable left atrial enlargement Borderline prolonged QT interval Since last tracing rate faster Otherwise no significant change Confirmed by Deno Etienne [id number]) on 01/13/2021 10:16:32 PM   Radiology CT Head Wo Contrast  Result Date: 01/13/2021 CLINICAL DATA:  Initial evaluation for acute delirium. EXAM: CT HEAD WITHOUT CONTRAST TECHNIQUE: Contiguous axial images were obtained from the base of the skull through the vertex without intravenous contrast. COMPARISON:  Recent CT from 12/18/2020. FINDINGS: Brain: Again seen are multiple hemorrhagic metastases scattered throughout both cerebral hemispheres. Overall, lesions are not appreciably changed or progressed as compared to most recent CT. Most prominent of these lesions positioned at the left parietal lobe in measures approximately 1.8 cm. Localized vasogenic edema about the preponderance of these lesions without midline shift or significant regional mass effect. No evidence for new/interval hemorrhage by CT. No appreciable infratentorial involvement. No acute large vessel territory infarct. Ventricles stable in size without hydrocephalus. No extra-axial fluid collection. Vascular: No hyperdense vessel. Skull: Scalp soft tissues demonstrate no acute finding. Post craniotomy [name] is noted at the left calvarium. Sinuses/Orbits: Globes and orbital soft tissues demonstrate no acute finding. Visualized paranasal sinuses are clear. No mastoid effusion. Other: None. IMPRESSION: 1. No  significant interval change in size and appearance of multiple hemorrhagic metastases scattered throughout both cerebral hemispheres, with similar localized vasogenic edema. No midline shift or significant regional mass effect. 2. No other acute intracranial abnormality. Electronically Signed   By: Jeannine Boga M.D.   On: 01/13/2021 23:16   DG Chest Port 1 View  Result Date: 01/13/2021 CLINICAL DATA:  Unresponsive EXAM: PORTABLE CHEST 1 VIEW COMPARISON:  12/18/2020 FINDINGS: The heart size and mediastinal contours are within normal limits. Both lungs are clear. The visualized skeletal structures are unremarkable. The lung volumes are low. These low lung volumes likely accentuate the upper mediastinum. IMPRESSION: Low lung volumes.  Otherwise, stable exam. Electronically Signed   By: Constance Holster M.D.   On: 01/13/2021 23:05    Procedures Procedures   Medications Ordered in ED Medications  lactated ringers infusion ( Intravenous New Bag/Given 01/13/21 2312)  vancomycin (VANCOREADY) IVPB 1500 mg/300 mL (1,500 mg Intravenous New Bag/Given 01/13/21 2305)  lactated ringers bolus 1,000 mL (0 mLs Intravenous Stopped 01/13/21 2322)    And  lactated ringers bolus 1,000 mL (0 mLs Intravenous Stopped 01/13/21 2322)    And  lactated ringers bolus 500 mL (500 mLs Intravenous New Bag/Given 01/13/21 2312)  ceFEPIme (MAXIPIME) 2 g in sodium chloride 0.9 % 100 mL IVPB (0 g Intravenous Stopped 01/13/21 2322)  metroNIDAZOLE (FLAGYL) IVPB 500 mg (0 mg Intravenous Stopped 01/13/21 2322)  lactated ringers bolus 1,000 mL (1,000 mLs Intravenous New Bag/Given 01/13/21 2313)    ED Course  I have reviewed the triage vital signs and the nursing notes.  Pertinent labs & imaging results that were available during my care of the patient were reviewed by me and considered in my medical decision making (see chart for details).    MDM Rules/Calculators/A&P                          55 yo M with a chief complaints  of altered mental  status.  Patient found to be hypotensive.  Cool extremities.  Code sepsis initiated.  IV fluids reassess.  Sepsis - Repeat Assessment  Performed at:    2315  Vitals     Blood pressure (!) 82/54, pulse (!) 127, temperature (!) 102.2 F (39 C), temperature source Rectal, resp. rate (!) 21, height 6' (1.829 m), weight 77.1 kg, SpO2 99 %.  Heart:     Tachycardic  Lungs:    CTA  Capillary Refill:   > 2 sec  Peripheral Pulse:   Radial pulse palpable  Skin:     Pale  CRITICAL CARE Performed by: Cecilio Asper   Total critical care time: 80 minutes  Critical care time was exclusive of separately billable procedures and treating other patients.  Critical care was necessary to treat or prevent imminent or life-threatening deterioration.  Critical care was time spent personally by me on the following activities: development of treatment plan with patient and/or surrogate as well as nursing, discussions with consultants, evaluation of patient's response to treatment, examination of patient, obtaining history from patient or surrogate, ordering and performing treatments and interventions, ordering and review of laboratory studies, ordering and review of radiographic studies, pulse oximetry and re-evaluation of patient's condition.  Patient care was signed out to Dr. Leonette Monarch please see his note for further details care in the ED.  The patients results and plan were reviewed and discussed.   Any x-rays performed were independently reviewed by myself.   Differential diagnosis were considered with the presenting HPI.  Medications  lactated ringers infusion ( Intravenous New Bag/Given 01/13/21 2312)  vancomycin (VANCOREADY) IVPB 1500 mg/300 mL (1,500 mg Intravenous New Bag/Given 01/13/21 2305)  lactated ringers bolus 1,000 mL (0 mLs Intravenous Stopped 01/13/21 2322)    And  lactated ringers bolus 1,000 mL (0 mLs Intravenous Stopped 01/13/21 2322)    And  lactated  ringers bolus 500 mL (500 mLs Intravenous New Bag/Given 01/13/21 2312)  ceFEPIme (MAXIPIME) 2 g in sodium chloride 0.9 % 100 mL IVPB (0 g Intravenous Stopped 01/13/21 2322)  metroNIDAZOLE (FLAGYL) IVPB 500 mg (0 mg Intravenous Stopped 01/13/21 2322)  lactated ringers bolus 1,000 mL (1,000 mLs Intravenous New Bag/Given 01/13/21 2313)    Vitals:   01/13/21 2220 01/13/21 2251 01/13/21 2300 01/13/21 2315  BP: (!) 82/53 (!) 82/54 (!) 86/59 (!) 86/55  Pulse: (!) 128 (!) 127 (!) 118   Resp: (!) 24 (!) 21 (!) 23 (!) 23  Temp:      TempSrc:      SpO2: 100% 99% 100%   Weight:      Height:        Final diagnoses:  Septic shock ([place])    Admission/ observation were discussed with the admitting physician, patient and/or family and they are comfortable with the plan.     Final Clinical Impression(s) / ED Diagnoses Final diagnoses:  Septic shock [facility])    Rx / DC Orders ED Discharge Orders    None       Deno Etienne, DO 01/13/21 2327

## 2021-01-13 NOTE — ED Notes (Signed)
Patient transported to CT 

## 2021-01-13 NOTE — ED Notes (Addendum)
Critical Lab   Potassium 2.7  Reported to Ellin Mayhew, MD

## 2021-01-14 ENCOUNTER — Other Ambulatory Visit: Payer: Self-pay

## 2021-01-14 ENCOUNTER — Encounter (HOSPITAL_COMMUNITY): Payer: Self-pay | Admitting: Internal Medicine

## 2021-01-14 DIAGNOSIS — G936 Cerebral edema: Secondary | ICD-10-CM | POA: Diagnosis present

## 2021-01-14 DIAGNOSIS — J189 Pneumonia, unspecified organism: Secondary | ICD-10-CM | POA: Diagnosis present

## 2021-01-14 DIAGNOSIS — G9341 Metabolic encephalopathy: Secondary | ICD-10-CM | POA: Diagnosis present

## 2021-01-14 DIAGNOSIS — C439 Malignant melanoma of skin, unspecified: Secondary | ICD-10-CM | POA: Diagnosis present

## 2021-01-14 DIAGNOSIS — N179 Acute kidney failure, unspecified: Secondary | ICD-10-CM | POA: Diagnosis present

## 2021-01-14 DIAGNOSIS — R6521 Severe sepsis with septic shock: Secondary | ICD-10-CM | POA: Diagnosis present

## 2021-01-14 DIAGNOSIS — C7931 Secondary malignant neoplasm of brain: Secondary | ICD-10-CM | POA: Diagnosis present

## 2021-01-14 DIAGNOSIS — A419 Sepsis, unspecified organism: Principal | ICD-10-CM | POA: Diagnosis present

## 2021-01-14 DIAGNOSIS — Z20822 Contact with and (suspected) exposure to covid-19: Secondary | ICD-10-CM | POA: Diagnosis present

## 2021-01-14 DIAGNOSIS — C7801 Secondary malignant neoplasm of right lung: Secondary | ICD-10-CM | POA: Diagnosis present

## 2021-01-14 DIAGNOSIS — E876 Hypokalemia: Secondary | ICD-10-CM | POA: Diagnosis present

## 2021-01-14 DIAGNOSIS — C7802 Secondary malignant neoplasm of left lung: Secondary | ICD-10-CM | POA: Diagnosis present

## 2021-01-14 DIAGNOSIS — Z781 Physical restraint status: Secondary | ICD-10-CM | POA: Diagnosis not present

## 2021-01-14 DIAGNOSIS — M7989 Other specified soft tissue disorders: Secondary | ICD-10-CM | POA: Diagnosis not present

## 2021-01-14 DIAGNOSIS — Z87891 Personal history of nicotine dependence: Secondary | ICD-10-CM | POA: Diagnosis not present

## 2021-01-14 DIAGNOSIS — Z9221 Personal history of antineoplastic chemotherapy: Secondary | ICD-10-CM | POA: Diagnosis not present

## 2021-01-14 DIAGNOSIS — Z66 Do not resuscitate: Secondary | ICD-10-CM | POA: Diagnosis present

## 2021-01-14 DIAGNOSIS — E785 Hyperlipidemia, unspecified: Secondary | ICD-10-CM | POA: Diagnosis present

## 2021-01-14 DIAGNOSIS — D638 Anemia in other chronic diseases classified elsewhere: Secondary | ICD-10-CM | POA: Diagnosis present

## 2021-01-14 DIAGNOSIS — E43 Unspecified severe protein-calorie malnutrition: Secondary | ICD-10-CM | POA: Diagnosis present

## 2021-01-14 DIAGNOSIS — N10 Acute tubulo-interstitial nephritis: Secondary | ICD-10-CM | POA: Diagnosis present

## 2021-01-14 DIAGNOSIS — C787 Secondary malignant neoplasm of liver and intrahepatic bile duct: Secondary | ICD-10-CM | POA: Diagnosis present

## 2021-01-14 DIAGNOSIS — E860 Dehydration: Secondary | ICD-10-CM | POA: Diagnosis present

## 2021-01-14 DIAGNOSIS — Z515 Encounter for palliative care: Secondary | ICD-10-CM | POA: Diagnosis not present

## 2021-01-14 LAB — CBC WITH DIFFERENTIAL/PLATELET
Abs Immature Granulocytes: 0.05 K/uL (ref 0.00–0.07)
Basophils Absolute: 0 K/uL (ref 0.0–0.1)
Basophils Relative: 0 %
Eosinophils Absolute: 0 K/uL (ref 0.0–0.5)
Eosinophils Relative: 0 %
HCT: 27.8 % — ABNORMAL LOW (ref 39.0–52.0)
Hemoglobin: 9.6 g/dL — ABNORMAL LOW (ref 13.0–17.0)
Immature Granulocytes: 1 %
Lymphocytes Relative: 7 %
Lymphs Abs: 0.5 K/uL — ABNORMAL LOW (ref 0.7–4.0)
MCH: 30.9 pg (ref 26.0–34.0)
MCHC: 34.5 g/dL (ref 30.0–36.0)
MCV: 89.4 fL (ref 80.0–100.0)
Monocytes Absolute: 0.6 K/uL (ref 0.1–1.0)
Monocytes Relative: 9 %
Neutro Abs: 5.8 K/uL (ref 1.7–7.7)
Neutrophils Relative %: 83 %
Platelets: 139 K/uL — ABNORMAL LOW (ref 150–400)
RBC: 3.11 MIL/uL — ABNORMAL LOW (ref 4.22–5.81)
RDW: 12.8 % (ref 11.5–15.5)
WBC: 6.9 K/uL (ref 4.0–10.5)
nRBC: 0 % (ref 0.0–0.2)

## 2021-01-14 LAB — COMPREHENSIVE METABOLIC PANEL
ALT: 34 U/L (ref 0–44)
AST: 56 U/L — ABNORMAL HIGH (ref 15–41)
Albumin: 2.2 g/dL — ABNORMAL LOW (ref 3.5–5.0)
Alkaline Phosphatase: 69 U/L (ref 38–126)
Anion gap: 10 (ref 5–15)
BUN: 25 mg/dL — ABNORMAL HIGH (ref 6–20)
CO2: 23 mmol/L (ref 22–32)
Calcium: 7.4 mg/dL — ABNORMAL LOW (ref 8.9–10.3)
Chloride: 101 mmol/L (ref 98–111)
Creatinine, Ser: 2.71 mg/dL — ABNORMAL HIGH (ref 0.61–1.24)
GFR, Estimated: 27 mL/min — ABNORMAL LOW (ref >60)
Glucose, Bld: 115 mg/dL — ABNORMAL HIGH (ref 70–99)
Potassium: 3.4 mmol/L — ABNORMAL LOW (ref 3.5–5.1)
Sodium: 134 mmol/L — ABNORMAL LOW (ref 135–145)
Total Bilirubin: 1.2 mg/dL (ref 0.3–1.2)
Total Protein: 4.4 g/dL — ABNORMAL LOW (ref 6.5–8.1)

## 2021-01-14 LAB — APTT: aPTT: 35 s (ref 24–36)

## 2021-01-14 LAB — PROCALCITONIN: Procalcitonin: 14.88 ng/mL

## 2021-01-14 LAB — LACTIC ACID, PLASMA: Lactic Acid, Venous: 1.5 mmol/L (ref 0.5–1.9)

## 2021-01-14 LAB — CBG MONITORING, ED: Glucose-Capillary: 112 mg/dL — ABNORMAL HIGH (ref 70–99)

## 2021-01-14 LAB — PROTIME-INR
INR: 1.2 (ref 0.8–1.2)
Prothrombin Time: 15.6 seconds — ABNORMAL HIGH (ref 11.4–15.2)

## 2021-01-14 LAB — GLUCOSE, CAPILLARY: Glucose-Capillary: 94 mg/dL (ref 70–99)

## 2021-01-14 MED ORDER — MELATONIN 5 MG PO TABS
5.0000 mg | ORAL_TABLET | Freq: Every evening | ORAL | Status: DC | PRN
  Administered 2021-01-14 – 2021-01-18 (×5): 5 mg via ORAL
  Filled 2021-01-14 (×5): qty 1

## 2021-01-14 MED ORDER — METRONIDAZOLE 500 MG/100ML IV SOLN
500.0000 mg | Freq: Three times a day (TID) | INTRAVENOUS | Status: DC
  Administered 2021-01-14 – 2021-01-18 (×14): 500 mg via INTRAVENOUS
  Filled 2021-01-14 (×14): qty 100

## 2021-01-14 MED ORDER — POTASSIUM CHLORIDE IN NACL 20-0.9 MEQ/L-% IV SOLN
Freq: Once | INTRAVENOUS | Status: AC
  Filled 2021-01-14: qty 1000

## 2021-01-14 MED ORDER — ACETAMINOPHEN 650 MG RE SUPP: 650.0000 mg | Freq: Four times a day (QID) | RECTAL | Status: DC | PRN

## 2021-01-14 MED ORDER — CITALOPRAM HYDROBROMIDE 20 MG PO TABS
10.0000 mg | ORAL_TABLET | Freq: Every day | ORAL | Status: DC
  Administered 2021-01-14 – 2021-01-25 (×11): 10 mg via ORAL
  Filled 2021-01-14 (×13): qty 1

## 2021-01-14 MED ORDER — POTASSIUM CHLORIDE CRYS ER 20 MEQ PO TBCR
40.0000 meq | EXTENDED_RELEASE_TABLET | Freq: Once | ORAL | Status: AC
  Administered 2021-01-14: 40 meq via ORAL
  Filled 2021-01-14: qty 2

## 2021-01-14 MED ORDER — PANTOPRAZOLE SODIUM 40 MG PO TBEC
40.0000 mg | DELAYED_RELEASE_TABLET | Freq: Every day | ORAL | Status: DC
  Administered 2021-01-16 – 2021-01-26 (×11): 40 mg via ORAL
  Filled 2021-01-14 (×12): qty 1

## 2021-01-14 MED ORDER — LACTATED RINGERS IV BOLUS
1000.0000 mL | Freq: Once | INTRAVENOUS | Status: AC
  Administered 2021-01-14: 1000 mL via INTRAVENOUS

## 2021-01-14 MED ORDER — SENNOSIDES-DOCUSATE SODIUM 8.6-50 MG PO TABS
1.0000 | ORAL_TABLET | Freq: Every day | ORAL | Status: DC
  Administered 2021-01-14 – 2021-01-28 (×13): 1 via ORAL
  Filled 2021-01-14 (×15): qty 1

## 2021-01-14 MED ORDER — SODIUM CHLORIDE 0.9 % IV SOLN: INTRAVENOUS | Status: DC

## 2021-01-14 MED ORDER — SODIUM CHLORIDE 0.9 % IV SOLN
2.0000 g | Freq: Two times a day (BID) | INTRAVENOUS | Status: DC
  Administered 2021-01-14 (×2): 2 g via INTRAVENOUS
  Filled 2021-01-14 (×2): qty 2

## 2021-01-14 MED ORDER — HYDROCORTISONE NA SUCCINATE PF 100 MG IJ SOLR
50.0000 mg | Freq: Three times a day (TID) | INTRAMUSCULAR | Status: DC
  Administered 2021-01-14 – 2021-01-16 (×8): 50 mg via INTRAVENOUS
  Filled 2021-01-14 (×8): qty 2

## 2021-01-14 MED ORDER — CHLORHEXIDINE GLUCONATE CLOTH 2 % EX PADS
6.0000 | MEDICATED_PAD | Freq: Every day | CUTANEOUS | Status: DC
  Administered 2021-01-14 – 2021-01-29 (×15): 6 via TOPICAL

## 2021-01-14 MED ORDER — LEVETIRACETAM IN NACL 500 MG/100ML IV SOLN
500.0000 mg | Freq: Two times a day (BID) | INTRAVENOUS | Status: DC
  Administered 2021-01-14 – 2021-01-19 (×11): 500 mg via INTRAVENOUS
  Filled 2021-01-14 (×11): qty 100

## 2021-01-14 MED ORDER — VANCOMYCIN HCL 1250 MG/250ML IV SOLN
1250.0000 mg | INTRAVENOUS | Status: DC
  Administered 2021-01-15: 1250 mg via INTRAVENOUS
  Filled 2021-01-14: qty 250

## 2021-01-14 MED ORDER — SODIUM CHLORIDE 0.9 % IV BOLUS
1000.0000 mL | Freq: Once | INTRAVENOUS | Status: AC
  Administered 2021-01-14: 1000 mL via INTRAVENOUS

## 2021-01-14 MED ORDER — ACETAMINOPHEN 325 MG PO TABS
650.0000 mg | ORAL_TABLET | Freq: Four times a day (QID) | ORAL | Status: DC | PRN
Start: 2021-01-14 — End: 2021-01-29
  Administered 2021-01-26: 650 mg via ORAL
  Filled 2021-01-14: qty 2

## 2021-01-14 MED ORDER — ORAL CARE MOUTH RINSE
15.0000 mL | Freq: Two times a day (BID) | OROMUCOSAL | Status: DC
  Administered 2021-01-14 – 2021-01-29 (×25): 15 mL via OROMUCOSAL

## 2021-01-14 NOTE — Progress Notes (Signed)
Pharmacy Antibiotic Note  Sean [name] is a 55 y.o. male admitted on 01/13/2021 with sepsis.  Pharmacy has been consulted for Cefepime & Vancomycin dosing.  Plan: Cefepime 2gm IV q12h Vancomycin to target AUC 400-550 Check Vancomycin levels at steady state Monitor renal function and cx data    Height: 6' (182.9 cm) Weight: 77.1 kg (170 lb) IBW/kg (Calculated) : 77.6  Temp (24hrs), Avg:101 F (38.3 C), Min:100.2 F (37.9 C), Max:102.2 F (39 C)  Recent Labs  Lab 01/13/21 2154  WBC 17.0*  CREATININE 2.72*  LATICACIDVEN 1.6    Estimated Creatinine Clearance: 33.1 mL/min (A) (by C-G formula based on SCr of 2.72 mg/dL (H)).    No Known Allergies  Antimicrobials this admission: 4/30 Cefepime >>  4/30 Vancomycin >>  4/30 Flagyl >>  Dose adjustments this admission:  Microbiology results: 4/30 BCx:  4/30 UCx:   4/30 Resp PCR: negative for COVID, influenza  Thank you for allowing pharmacy to be a part of this patient's care.  Netta Cedars PharmD, BCPS 01/14/2021 4:32 AM

## 2021-01-14 NOTE — ED Provider Notes (Signed)
I assumed care of this patient.  Please see previous provider note for further details of Hx, PE.  Briefly patient is a 55 y.o. male who presented AMS. Noted to be septic. Has known brain mets. CT head unchanged.  Already getting abx and IVF. BPs improving with IVF on reassessment.  No obvious source of infection on work up. Blood culture pending. Cannot LP due to known mets.  Wife updated. Confirmed DNR/DNI limited. Would benefit from palliative care consult for patient and wife.  Admit to SDU.  Marland Kitchen1-3 Lead EKG Interpretation Performed by: Fatima Blank, MD Authorized by: Fatima Blank, MD     Interpretation: normal     ECG rate:  110   ECG rate assessment: normal     Rhythm: sinus rhythm     Ectopy: none     Conduction: normal   .Critical Care Performed by: Fatima Blank, MD Authorized by: Fatima Blank, MD   Critical care provider statement:    Critical care time (minutes):  30   Critical care was necessary to treat or prevent imminent or life-threatening deterioration of the following conditions:  Sepsis and dehydration   Critical care was time spent personally by me on the following activities:  Discussions with consultants, evaluation of patient's response to treatment, examination of patient, ordering and performing treatments and interventions, ordering and review of laboratory studies, ordering and review of radiographic studies, pulse oximetry, re-evaluation of patient's condition, obtaining history from patient or surrogate and review of old charts   Care discussed with: admitting provider            Fatima Blank, MD 01/14/21 [phone]

## 2021-01-14 NOTE — Progress Notes (Signed)
PROGRESS NOTE  Sean [name] [id number] DOB: 1966/02/05 DOA: 01/13/2021 PCP: [name], MD  HPI/Recap of past 24 hours: HPI from Dr Liane Comber is a 55 y.o. 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  Patient's wife states that he was at his baseline mental status 1 day PTA, when she checked on him at the facility.  Last few days patient has been having persistent nausea, vomiting unable to keep his medications.  Has been doing poorly last few days. 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 In the ER patient was hypotensive, tachycardic and febrile with temperature of 103 F, chest x-ray unremarkable UA unremarkable CT head shows hemorrhagic metastasis. COVID test is negative. Labs show potassium of 2.7, creatinine of 2.7, WBC of 17, lactic acid to be normal. Patient was empirically started on antibiotics with unknown source.  Admitted for further work-up of sepsis.      Today, patient noted to be more alert, awake, intermittently confused.  Wife at bedside.  Patient denies any chest pain, abdominal pain, further nausea/vomiting, short of breath.  BP continues to remain soft.    Assessment/Plan: Principal Problem:   Sepsis ([place]) Active Problems:   Brain metastases ([facility])   Seizure ([place])   Hypokalemia   Acute tubulointerstitial nephritis   Sepsis of unknown etiology ?HCAP On admission was febrile, tachycardic, tachypneic, with leukocytosis Currently afebrile, with no leukocytosis UA unremarkable, UC pending BC x2 pending, LA WNL Procalcitonin 14.88 Chest x-ray shows low lung volumes, otherwise unremarkable May need to obtain CT  abdomen/pelvis as well as chest if no improvement within a day or so Continue IV vancomycin, cefepime Continue aggressive IV fluids Monitor closely  Acute metabolic encephalopathy Underlying cognitive impairment Improving Likely 2/2 above CT head showed stable extensive hemorrhagic mets, no acute intracranial abnormality Monitor closely  ??Possible secondary adrenal insufficiency ??Steroid withdrawal Patient was unable to keep any medications down for the past few days Continue stress dose steroid Continue IV fluids  AKI History of tubulointerstitial nephritis Noted dehydration Baseline creatinine 1.4-2.2, on admission 2.71 Continue IV fluids Daily BMP  Hypotension Multifactorial, sepsis, dehydration, possible steroid withdrawal Continue IV fluids, stress dose steroids  Hypokalemia Replace as needed  Anemia of chronic disease Hemoglobin baseline around Noted hemoglobin drop, likely hemodilution from aggressive IV fluids Daily CBC  Metastatic melanoma with extensive mets including brain S/p craniotomy and resection of left occipital and left frontal lesion in 1/22 Follows Dr. 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   Weight as of this encounter: 77.1 kg.     Code Status: DNR  Family Communication: Discussed with wife at bedside on 01/14/2021  Disposition Plan: Status is: Inpatient  Remains inpatient appropriate because:Inpatient level of care appropriate due to severity of illness   Dispo: The patient is from: SNF              Anticipated d/c is to: SNF  Patient currently is not medically stable to d/c.   Difficult to place patient No   Consultants:  Added Dr. [name] to the care  team  Procedures:  None  Antimicrobials:  Vancomycin  Cefepime  DVT prophylaxis: SCDs   Objective: Vitals:   01/14/21 1200 01/14/21 1241 01/14/21 1300 01/14/21 1330  BP: 110/60  (!) 81/52 (!) 89/51  Pulse: 85  87 87  Resp: 19  (!) 23 (!) 21  Temp:  98.3 F (36.8 C)    TempSrc:  Oral    SpO2: 100%  95% 95%  Weight:      Height:        Intake/Output Summary (Last 24 hours) at 01/14/2021 1349 Last data filed at 01/14/2021 0448 Gross per 24 hour  Intake 6000 ml  Output --  Net 6000 ml   Filed Weights   01/13/21 2212  Weight: 77.1 kg    Exam:  General: NAD, intermittent confusion noted, awake, chronically ill-appearing  Cardiovascular: S1, S2 present  Respiratory: CTAB  Abdomen: Soft, nontender, nondistended, bowel sounds present  Musculoskeletal: No bilateral pedal edema noted  Skin: Normal  Psychiatry:  Unable to assess   Data Reviewed: CBC: Recent Labs  Lab 01/13/21 2154 01/14/21 0426  WBC 17.0* 6.9  NEUTROABS 15.1* 5.8  HGB 13.0 9.6*  HCT 36.6* 27.8*  MCV 86.7 89.4  PLT 267 258*   Basic Metabolic Panel: Recent Labs  Lab 01/13/21 2154 01/14/21 0426  NA 133* 134*  K 2.7* 3.4*  CL 97* 101  CO2 22 23  GLUCOSE 148* 115*  BUN 26* 25*  CREATININE 2.72* 2.71*  CALCIUM 8.7* 7.4*   GFR: Estimated Creatinine Clearance: 33.2 mL/min (A) (by C-G formula based on SCr of 2.71 mg/dL (H)). Liver Function Tests: Recent Labs  Lab 01/13/21 2154 01/14/21 0426  AST 35 56*  ALT 40 34  ALKPHOS 101 69  BILITOT 1.8* 1.2  PROT 6.6 4.4*  ALBUMIN 3.2* 2.2*   No results for input(s): LIPASE, AMYLASE in the last 168 hours. No results for input(s): AMMONIA in the last 168 hours. Coagulation Profile: Recent Labs  Lab 01/13/21 2154 01/14/21 0426  INR 1.0 1.2   Cardiac Enzymes: No results for input(s): CKTOTAL, CKMB, CKMBINDEX, TROPONINI in the last 168 hours. BNP (last 3 results) No results for input(s): PROBNP in the last 8760  hours. HbA1C: No results for input(s): HGBA1C in the last 72 hours. CBG: Recent Labs  Lab 01/14/21 0540  GLUCAP 112*   Lipid Profile: No results for input(s): CHOL, HDL, LDLCALC, TRIG, CHOLHDL, LDLDIRECT in the last 72 hours. Thyroid Function Tests: No results for input(s): TSH, T4TOTAL, FREET4, T3FREE, THYROIDAB in the last 72 hours. Anemia Panel: No results for input(s): VITAMINB12, FOLATE, FERRITIN, TIBC, IRON, RETICCTPCT in the last 72 hours. Urine analysis:    Component Value Date/Time   COLORURINE YELLOW 01/13/2021 2154   APPEARANCEUR CLEAR 01/13/2021 2154   LABSPEC 1.011 01/13/2021 2154   PHURINE 5.0 01/13/2021 2154   GLUCOSEU NEGATIVE 01/13/2021 2154   HGBUR NEGATIVE 01/13/2021 2154   BILIRUBINUR NEGATIVE 01/13/2021 2154   KETONESUR 20 (A) 01/13/2021 2154   PROTEINUR NEGATIVE 01/13/2021 2154   NITRITE NEGATIVE 01/13/2021 2154   LEUKOCYTESUR NEGATIVE 01/13/2021 2154   Sepsis Labs: @LABRCNTIP (procalcitonin:4,lacticidven:4)  ) Recent Results (from the past 240 hour(s))  Resp Panel by RT-PCR (Flu A&B, Covid) Nasopharyngeal Swab     Status: None   Collection Time: 01/13/21  9:54 PM   Specimen: Nasopharyngeal Swab; Nasopharyngeal(NP) swabs in vial  transport medium  Result Value Ref Range Status   SARS Coronavirus 2 by RT PCR NEGATIVE NEGATIVE Final    Comment: (NOTE) SARS-CoV-2 target nucleic acids are NOT DETECTED.  The SARS-CoV-2 RNA is generally detectable in upper respiratory specimens during the acute phase of infection. The lowest concentration of SARS-CoV-2 viral copies this assay can detect is 138 copies/mL. A negative result does not preclude SARS-Cov-2 infection and should not be used as the sole basis for treatment or other patient management decisions. A negative result may occur with  improper specimen collection/handling, submission of specimen other than nasopharyngeal swab, presence of viral mutation(s) within the areas targeted by this assay, and  inadequate number of viral copies(<138 copies/mL). A negative result must be combined with clinical observations, patient history, and epidemiological information. The expected result is Negative.  Fact Sheet for Patients:  EntrepreneurPulse.com.au  Fact Sheet for Healthcare Providers:  IncredibleEmployment.be  This test is no t yet approved or cleared by the [place] FDA and  has been authorized for detection and/or diagnosis of SARS-CoV-2 by FDA under an Emergency Use Authorization (EUA). This EUA will remain  in effect (meaning this test can be used) for the duration of the COVID-19 declaration under Section 564(b)(1) of the Act, 21 U.S.C.section 360bbb-3(b)(1), unless the authorization is terminated  or revoked sooner.       Influenza A by PCR NEGATIVE NEGATIVE Final   Influenza B by PCR NEGATIVE NEGATIVE Final    Comment: (NOTE) The Xpert Xpress SARS-CoV-2/FLU/RSV plus assay is intended as an aid in the diagnosis of influenza from Nasopharyngeal swab specimens and should not be used as a sole basis for treatment. Nasal washings and aspirates are unacceptable for Xpert Xpress SARS-CoV-2/FLU/RSV testing.  Fact Sheet for Patients: EntrepreneurPulse.com.au  Fact Sheet for Healthcare Providers: IncredibleEmployment.be  This test is not yet approved or cleared by the [place] FDA and has been authorized for detection and/or diagnosis of SARS-CoV-2 by FDA under an Emergency Use Authorization (EUA). This EUA will remain in effect (meaning this test can be used) for the duration of the COVID-19 declaration under Section 564(b)(1) of the Act, 21 U.S.C. section 360bbb-3(b)(1), unless the authorization is terminated or revoked.  Performed at [facility], [place] [address] [place], [place] [address]       Studies: CT Head Wo Contrast  Result Date: 01/13/2021 CLINICAL DATA:   Initial evaluation for acute delirium. EXAM: CT HEAD WITHOUT CONTRAST TECHNIQUE: Contiguous axial images were obtained from the base of the skull through the vertex without intravenous contrast. COMPARISON:  Recent CT from 12/18/2020. FINDINGS: Brain: Again seen are multiple hemorrhagic metastases scattered throughout both cerebral hemispheres. Overall, lesions are not appreciably changed or progressed as compared to most recent CT. Most prominent of these lesions positioned at the left parietal lobe in measures approximately 1.8 cm. Localized vasogenic edema about the preponderance of these lesions without midline shift or significant regional mass effect. No evidence for new/interval hemorrhage by CT. No appreciable infratentorial involvement. No acute large vessel territory infarct. Ventricles stable in size without hydrocephalus. No extra-axial fluid collection. Vascular: No hyperdense vessel. Skull: Scalp soft tissues demonstrate no acute finding. Post craniotomy [name] is noted at the left calvarium. Sinuses/Orbits: Globes and orbital soft tissues demonstrate no acute finding. Visualized paranasal sinuses are clear. No mastoid effusion. Other: None. IMPRESSION: 1. No significant interval change in size and appearance of multiple hemorrhagic metastases scattered throughout both cerebral hemispheres, with similar localized vasogenic edema. No midline shift  or significant regional mass effect. 2. No other acute intracranial abnormality. Electronically Signed   By: Jeannine Boga M.D.   On: 01/13/2021 23:16   DG Chest Port 1 View  Result Date: 01/13/2021 CLINICAL DATA:  Unresponsive EXAM: PORTABLE CHEST 1 VIEW COMPARISON:  12/18/2020 FINDINGS: The heart size and mediastinal contours are within normal limits. Both lungs are clear. The visualized skeletal structures are unremarkable. The lung volumes are low. These low lung volumes likely accentuate the upper mediastinum. IMPRESSION: Low lung volumes.   Otherwise, stable exam. Electronically Signed   By: Constance Holster M.D.   On: 01/13/2021 23:05    Scheduled Meds: . Chlorhexidine Gluconate Cloth  6 each Topical Daily  . hydrocortisone sod succinate (SOLU-CORTEF) inj  50 mg Intravenous Q8H  . mouth rinse  15 mL Mouth Rinse BID    Continuous Infusions: . ceFEPime (MAXIPIME) IV Stopped (01/14/21 5035)  . lactated ringers 150 mL/hr at 01/14/21 0821  . levETIRAcetam 500 mg (01/14/21 0525)  . metronidazole 500 mg (01/14/21 1344)  . [START ON 01/15/2021] vancomycin       LOS: 0 days     Alma Friendly, MD [facility]  If 7PM-7AM, please contact night-coverage [email or web address] 01/14/2021, 1:49 PM

## 2021-01-14 NOTE — H&P (Signed)
History and Physical    [name]:[id number] DOB: [birth year] DOA: 01/13/2021  PCP: [name], MD  Patient coming from: Skilled nursing facility.  History obtained from patient's wife.  Patient is encephalopathic.  Chief Complaint: Unresponsive episode.  HPI: Sean [name] is a 55 y.o. 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  Patient's wife states that he was at his baseline mental status yesterday when she checked on him at the facility.  Last few days patient has been having persistent nausea vomiting unable to keep his medications.  Has been doing poorly last few days.  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  ED Course: In the ER patient was hypotensive and tachycardic febrile with temperature of 103 F chest x-ray unremarkable UA unremarkable CT head shows hemorrhagic metastasis.  Patient was empirically started on antibiotics with unknown source.  Admitted for further work-up of sepsis.  Admission mental status improved after fluids but still not at baseline.  COVID test is negative.  Labs show potassium of 2.7 creatinine of 2.7 WBC of 17 lactic acid to be normal.  Review of Systems: As per HPI, rest all negative.   Past Medical History:  Diagnosis Date  . Brain lesion 09/14/2020  . Cancer ([place])    Malignant melanoma to his brain  . Hyperlipidemia   . Liver lesion 09/14/2020    Past Surgical History:  Procedure Laterality Date  . APPLICATION OF CRANIAL NAVIGATION  09/29/2020   Procedure: APPLICATION OF CRANIAL NAVIGATION;  Surgeon: Judith Part, MD;  Location: [facility];  Service: Neurosurgery;;  . Kyla Balzarine Left 09/29/2020    Procedure: Left craniotomy for tumor resection with brainlab;  Surgeon: Judith Part, MD;  Location: [facility];  Service: Neurosurgery;  Laterality: Left;  . HERNIA REPAIR     left side inguinal hernia about 18-20 years     reports that he has quit smoking. He has never used smokeless tobacco. He reports previous alcohol use. He reports that he does not use drugs.  No Known Allergies  Family History  Problem Relation Age of Onset  . Osteoarthritis Mother   . Hypertension Father   . Colon polyps Father   . Colon polyps Brother   . Colon polyps Brother   . Colon cancer Neg Hx   . Stomach cancer Neg Hx   . Rectal cancer Neg Hx   . Esophageal cancer Neg Hx   . Liver cancer Neg Hx     Prior to Admission medications   Medication Sig Start Date End Date Taking? Authorizing Provider  acetaminophen (TYLENOL) 325 MG tablet Take 2 tablets (650 mg total) by mouth every 6 (six) hours as needed for mild pain (or Fever >/= 101). 11/08/20   Angiulli, Lavon Paganini, PA-C  citalopram (CELEXA) 10 MG tablet Take 1 tablet (10 mg total) by mouth at bedtime. 11/08/20   Angiulli, Lavon Paganini, PA-C  levETIRAcetam (KEPPRA) 500 MG tablet Take 1 tablet (500 mg total) by mouth 2 (two) times daily. 11/08/20   Angiulli, Lavon Paganini, PA-C  LORazepam (ATIVAN) 1 MG tablet Take 1 tablet (1 mg total) by mouth 2 (two) times daily as needed for anxiety. 01/03/21 11-Feb-2021  Dessa Phi, DO  melatonin 5 MG TABS Take 1 tablet (5 mg total) by mouth at bedtime as needed. 11/08/20   Angiulli, Lavon Paganini, PA-C  Multiple Vitamin (MULTIVITAMIN WITH MINERALS) TABS tablet Take 1 tablet by mouth daily. Patient taking differently: Take 2 tablets by mouth daily. gummy 09/19/20   Geradine Girt, DO  pantoprazole (PROTONIX) 40 MG tablet Take 1 tablet (40 mg total) by mouth daily. 11/08/20   Angiulli, Lavon Paganini, PA-C  predniSONE (DELTASONE) 20 MG tablet Take 2 tablets (40 mg total) by mouth daily with breakfast. 01/04/21   Dessa Phi, DO   senna-docusate (SENOKOT-S) 8.6-50 MG tablet Take 2 tablets by mouth at bedtime. 11/08/20   Angiulli, Lavon Paganini, PA-C    Physical Exam: Constitutional: Moderately built and nourished. Vitals:   01/14/21 0235 01/14/21 0245 01/14/21 0315 01/14/21 0345  BP: 94/61 (!) 104/57 (!) 81/47 (!) 80/52  Pulse: (!) 111 (!) 110 (!) 107 (!) 114  Resp: (!) 25 20 (!) 28 (!) 27  Temp:   100.2 F (37.9 C)   TempSrc:   Rectal   SpO2: 100% 100% 100% 100%  Weight:      Height:       Eyes: Anicteric no pallor. ENMT: No discharge from the ears eyes nose or mouth. Neck: No mass felt.  No neck rigidity. Respiratory: No rhonchi or crepitations. Cardiovascular: S1-S2 heard. Abdomen: Soft nontender bowel sounds present. Musculoskeletal: No edema. Skin: No rash. Neurologic: Patient is alert awake but not conversant or communicating.  Moving all extremities. Psychiatric: Appears confused.   Labs on Admission: I have personally reviewed following labs and imaging studies  CBC: Recent Labs  Lab 01/13/21 2154  WBC 17.0*  NEUTROABS 15.1*  HGB 13.0  HCT 36.6*  MCV 86.7  PLT 716   Basic Metabolic Panel: Recent Labs  Lab 01/13/21 2154  NA 133*  K 2.7*  CL 97*  CO2 22  GLUCOSE 148*  BUN 26*  CREATININE 2.72*  CALCIUM 8.7*   GFR: Estimated Creatinine Clearance: 33.1 mL/min (A) (by C-G formula based on SCr of 2.72 mg/dL (H)). Liver Function Tests: Recent Labs  Lab 01/13/21 2154  AST 35  ALT 40  ALKPHOS 101  BILITOT 1.8*  PROT 6.6  ALBUMIN 3.2*   No results for input(s): LIPASE, AMYLASE in the last 168 hours. No results for input(s): AMMONIA in the last 168 hours. Coagulation Profile: Recent Labs  Lab 01/13/21 2154  INR 1.0   Cardiac Enzymes: No results for input(s): CKTOTAL, CKMB, CKMBINDEX, TROPONINI in the last 168 hours. BNP (last 3 results) No results for input(s): PROBNP in the last 8760 hours. HbA1C: No results for input(s): HGBA1C in the last 72 hours. CBG: No results  for input(s): GLUCAP in the last 168 hours. Lipid Profile: No results for input(s): CHOL, HDL, LDLCALC, TRIG, CHOLHDL, LDLDIRECT in the last 72 hours. Thyroid Function Tests: No results for input(s): TSH, T4TOTAL, FREET4, T3FREE, THYROIDAB in the last 72 hours. Anemia Panel: No results for input(s): VITAMINB12, FOLATE, FERRITIN, TIBC, IRON, RETICCTPCT in the last 72 hours. Urine analysis:    Component Value Date/Time   COLORURINE YELLOW 01/13/2021 2154   APPEARANCEUR CLEAR 01/13/2021 2154   LABSPEC 1.011 01/13/2021 2154   PHURINE 5.0 01/13/2021 2154   GLUCOSEU NEGATIVE 01/13/2021 2154   HGBUR NEGATIVE 01/13/2021 2154   BILIRUBINUR NEGATIVE 01/13/2021 2154   KETONESUR 20 (A) 01/13/2021 2154   PROTEINUR NEGATIVE 01/13/2021 2154   NITRITE NEGATIVE 01/13/2021 2154   LEUKOCYTESUR NEGATIVE 01/13/2021 2154  Sepsis Labs: @LABRCNTIP (procalcitonin:4,lacticidven:4) ) Recent Results (from the past 240 hour(s))  Resp Panel by RT-PCR (Flu A&B, Covid) Nasopharyngeal Swab     Status: None   Collection Time: 01/13/21  9:54 PM   Specimen: Nasopharyngeal Swab; Nasopharyngeal(NP) swabs in vial transport medium  Result Value Ref Range Status   SARS Coronavirus 2 by RT PCR NEGATIVE NEGATIVE Final    Comment: (NOTE) SARS-CoV-2 target nucleic acids are NOT DETECTED.  The SARS-CoV-2 RNA is generally detectable in upper respiratory specimens during the acute phase of infection. The lowest concentration of SARS-CoV-2 viral copies this assay can detect is 138 copies/mL. A negative result does not preclude SARS-Cov-2 infection and should not be used as the sole basis for treatment or other patient management decisions. A negative result may occur with  improper specimen collection/handling, submission of specimen other than nasopharyngeal swab, presence of viral mutation(s) within the areas targeted by this assay, and inadequate number of viral copies(<138 copies/mL). A negative result must be  combined with clinical observations, patient history, and epidemiological information. The expected result is Negative.  Fact Sheet for Patients:  EntrepreneurPulse.com.au  Fact Sheet for Healthcare Providers:  IncredibleEmployment.be  This test is no t yet approved or cleared by the [place] FDA and  has been authorized for detection and/or diagnosis of SARS-CoV-2 by FDA under an Emergency Use Authorization (EUA). This EUA will remain  in effect (meaning this test can be used) for the duration of the COVID-19 declaration under Section 564(b)(1) of the Act, 21 U.S.C.section 360bbb-3(b)(1), unless the authorization is terminated  or revoked sooner.       Influenza A by PCR NEGATIVE NEGATIVE Final   Influenza B by PCR NEGATIVE NEGATIVE Final    Comment: (NOTE) The Xpert Xpress SARS-CoV-2/FLU/RSV plus assay is intended as an aid in the diagnosis of influenza from Nasopharyngeal swab specimens and should not be used as a sole basis for treatment. Nasal washings and aspirates are unacceptable for Xpert Xpress SARS-CoV-2/FLU/RSV testing.  Fact Sheet for Patients: EntrepreneurPulse.com.au  Fact Sheet for Healthcare Providers: IncredibleEmployment.be  This test is not yet approved or cleared by the [place] FDA and has been authorized for detection and/or diagnosis of SARS-CoV-2 by FDA under an Emergency Use Authorization (EUA). This EUA will remain in effect (meaning this test can be used) for the duration of the COVID-19 declaration under Section 564(b)(1) of the Act, 21 U.S.C. section 360bbb-3(b)(1), unless the authorization is terminated or revoked.  Performed at [facility], [place] [address], [place], [place] [address]      Radiological Exams on Admission: CT Head Wo Contrast  Result Date: 01/13/2021 CLINICAL DATA:  Initial evaluation for acute delirium. EXAM: CT HEAD  WITHOUT CONTRAST TECHNIQUE: Contiguous axial images were obtained from the base of the skull through the vertex without intravenous contrast. COMPARISON:  Recent CT from 12/18/2020. FINDINGS: Brain: Again seen are multiple hemorrhagic metastases scattered throughout both cerebral hemispheres. Overall, lesions are not appreciably changed or progressed as compared to most recent CT. Most prominent of these lesions positioned at the left parietal lobe in measures approximately 1.8 cm. Localized vasogenic edema about the preponderance of these lesions without midline shift or significant regional mass effect. No evidence for new/interval hemorrhage by CT. No appreciable infratentorial involvement. No acute large vessel territory infarct. Ventricles stable in size without hydrocephalus. No extra-axial fluid collection. Vascular: No hyperdense vessel. Skull: Scalp soft tissues demonstrate no acute finding. Post craniotomy [name] is noted at the left calvarium. Sinuses/Orbits:  Globes and orbital soft tissues demonstrate no acute finding. Visualized paranasal sinuses are clear. No mastoid effusion. Other: None. IMPRESSION: 1. No significant interval change in size and appearance of multiple hemorrhagic metastases scattered throughout both cerebral hemispheres, with similar localized vasogenic edema. No midline shift or significant regional mass effect. 2. No other acute intracranial abnormality. Electronically Signed   By: Jeannine Boga M.D.   On: 01/13/2021 23:16   DG Chest Port 1 View  Result Date: 01/13/2021 CLINICAL DATA:  Unresponsive EXAM: PORTABLE CHEST 1 VIEW COMPARISON:  12/18/2020 FINDINGS: The heart size and mediastinal contours are within normal limits. Both lungs are clear. The visualized skeletal structures are unremarkable. The lung volumes are low. These low lung volumes likely accentuate the upper mediastinum. IMPRESSION: Low lung volumes.  Otherwise, stable exam. Electronically Signed   By:  Constance Holster M.D.   On: 01/13/2021 23:05    EKG: Independently reviewed.  Sinus tachycardia.  Assessment/Plan Principal Problem:   Sepsis ([place]) Active Problems:   Brain metastases ([facility])   Seizure ([facility])   Hypokalemia   Acute tubulointerstitial nephritis    1. Sepsis source not clear.  On admission patient had temperature 103 F tachycardic with leukocytosis.  We will keep patient on empiric antibiotics follow cultures. 2. Recent diagnosis of tubulointerstitial nephritis creatinine appears to be mildly increased from baseline.  Since patient will be in.  We will keep patient on IV hydrocortisone stress dose steroids. 3. History of seizures on Keppra which we will dose with IV until patient can reliably take orally. 4. Hypokalemia replace and recheck. 5. Metastatic melanoma including mets to the brain being followed by Dr. Betsy Coder.  Since patient has septic picture on admission will need inpatient status.   DVT prophylaxis: SCDs.  Patient has hemorrhagic metastasis to the brain already anticoagulation. Code Status: DNR confirmed with patient's wife. Family Communication: Patient's wife. Disposition Plan: To be determined. Consults called: None. Admission status: Inpatient.   Rise Patience MD [facility] Pager [phone].  If 7PM-7AM, please contact night-coverage [email or web address] Password [email or web address]  01/14/2021, 4:05 AM

## 2021-01-15 DIAGNOSIS — I959 Hypotension, unspecified: Secondary | ICD-10-CM

## 2021-01-15 DIAGNOSIS — A419 Sepsis, unspecified organism: Secondary | ICD-10-CM | POA: Diagnosis not present

## 2021-01-15 DIAGNOSIS — R569 Unspecified convulsions: Secondary | ICD-10-CM

## 2021-01-15 DIAGNOSIS — C7931 Secondary malignant neoplasm of brain: Secondary | ICD-10-CM

## 2021-01-15 DIAGNOSIS — N10 Acute tubulo-interstitial nephritis: Secondary | ICD-10-CM

## 2021-01-15 DIAGNOSIS — E876 Hypokalemia: Secondary | ICD-10-CM | POA: Diagnosis not present

## 2021-01-15 DIAGNOSIS — N179 Acute kidney failure, unspecified: Secondary | ICD-10-CM

## 2021-01-15 LAB — COMPREHENSIVE METABOLIC PANEL
ALT: 37 U/L (ref 0–44)
AST: 53 U/L — ABNORMAL HIGH (ref 15–41)
Albumin: 2.3 g/dL — ABNORMAL LOW (ref 3.5–5.0)
Alkaline Phosphatase: 63 U/L (ref 38–126)
Anion gap: 9 (ref 5–15)
BUN: 34 mg/dL — ABNORMAL HIGH (ref 6–20)
CO2: 22 mmol/L (ref 22–32)
Calcium: 8 mg/dL — ABNORMAL LOW (ref 8.9–10.3)
Chloride: 109 mmol/L (ref 98–111)
Creatinine, Ser: 3.14 mg/dL — ABNORMAL HIGH (ref 0.61–1.24)
GFR, Estimated: 22 mL/min — ABNORMAL LOW (ref >60)
Glucose, Bld: 113 mg/dL — ABNORMAL HIGH (ref 70–99)
Potassium: 3.5 mmol/L (ref 3.5–5.1)
Sodium: 140 mmol/L (ref 135–145)
Total Bilirubin: 0.8 mg/dL (ref 0.3–1.2)
Total Protein: 4.7 g/dL — ABNORMAL LOW (ref 6.5–8.1)

## 2021-01-15 LAB — CBC WITH DIFFERENTIAL/PLATELET
Abs Immature Granulocytes: 0.03 10*3/uL (ref 0.00–0.07)
Basophils Absolute: 0 10*3/uL (ref 0.0–0.1)
Basophils Relative: 0 %
Eosinophils Absolute: 0 10*3/uL (ref 0.0–0.5)
Eosinophils Relative: 0 %
HCT: 25.9 % — ABNORMAL LOW (ref 39.0–52.0)
Hemoglobin: 8.6 g/dL — ABNORMAL LOW (ref 13.0–17.0)
Immature Granulocytes: 1 %
Lymphocytes Relative: 4 %
Lymphs Abs: 0.2 10*3/uL — ABNORMAL LOW (ref 0.7–4.0)
MCH: 30.3 pg (ref 26.0–34.0)
MCHC: 33.2 g/dL (ref 30.0–36.0)
MCV: 91.2 fL (ref 80.0–100.0)
Monocytes Absolute: 0.3 10*3/uL (ref 0.1–1.0)
Monocytes Relative: 6 %
Neutro Abs: 4.1 10*3/uL (ref 1.7–7.7)
Neutrophils Relative %: 89 %
Platelets: 137 10*3/uL — ABNORMAL LOW (ref 150–400)
RBC: 2.84 MIL/uL — ABNORMAL LOW (ref 4.22–5.81)
RDW: 12.8 % (ref 11.5–15.5)
WBC: 4.6 10*3/uL (ref 4.0–10.5)
nRBC: 0 % (ref 0.0–0.2)

## 2021-01-15 LAB — GLUCOSE, CAPILLARY
Glucose-Capillary: 105 mg/dL — ABNORMAL HIGH (ref 70–99)
Glucose-Capillary: 112 mg/dL — ABNORMAL HIGH (ref 70–99)
Glucose-Capillary: 114 mg/dL — ABNORMAL HIGH (ref 70–99)
Glucose-Capillary: 92 mg/dL (ref 70–99)
Glucose-Capillary: 94 mg/dL (ref 70–99)

## 2021-01-15 LAB — MRSA PCR SCREENING: MRSA by PCR: NEGATIVE

## 2021-01-15 LAB — URINE CULTURE: Culture: NO GROWTH

## 2021-01-15 LAB — PROCALCITONIN: Procalcitonin: 5.96 ng/mL

## 2021-01-15 LAB — OCCULT BLOOD X 1 CARD TO LAB, STOOL: Fecal Occult Bld: NEGATIVE

## 2021-01-15 MED ORDER — SODIUM CHLORIDE 0.9 % IV SOLN
2.0000 g | INTRAVENOUS | Status: DC
  Administered 2021-01-15: 2 g via INTRAVENOUS
  Filled 2021-01-15: qty 2

## 2021-01-15 NOTE — Progress Notes (Signed)
Pharmacy Antibiotic Note  Sean [name] is a 55 y.o. male admitted on 01/13/2021 with sepsis.  Pharmacy has been consulted for Cefepime & Vancomycin dosing.  Day 3 Vanc/cefepime/flagyl SCr 3.14 worsening some - est CrCl 29 WBC improved  Plan:  Change cefepime from 2g IV q12 to 2g IV q24 per current renal function  Continue current vanc dosing with target AUC 400-550  Check Vancomycin levels at steady state  Monitor renal function and cx data    Height: 6' (182.9 cm) Weight: 77.1 kg (170 lb) IBW/kg (Calculated) : 77.6  Temp (24hrs), Avg:98.2 F (36.8 C), Min:97.5 F (36.4 C), Max:99 F (37.2 C)  Recent Labs  Lab 01/13/21 2154 01/14/21 0426 01/15/21 0256  WBC 17.0* 6.9 4.6  CREATININE 2.72* 2.71* 3.14*  LATICACIDVEN 1.6 1.5  --     Estimated Creatinine Clearance: 28.6 mL/min (A) (by C-G formula based on SCr of 3.14 mg/dL (H)).    No Known Allergies  Antimicrobials this admission: 4/30 Cefepime >>  4/30 Vancomycin >>  4/30 Flagyl >>  Dose adjustments this admission:  Microbiology results: 4/30 BCx:  4/30 UCx:   4/30 Resp PCR: negative for COVID, influenza  Thank you for allowing pharmacy to be a part of this patient's care.  Kara Mead PharmD, BCPS 01/15/2021 7:22 AM

## 2021-01-15 NOTE — Progress Notes (Addendum)
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  The patient is now admitted for sepsis of unclear etiology.  Receiving antibiotics.  Renal function appears to be worse today.  Having loose stools.  Confused.  Oncology History  Melanoma of skin ([place])  10/05/2020 Initial Diagnosis   Melanoma of skin ([employer])   10/05/2020 Cancer Staging   Staging form: Melanoma of the Skin, AJCC 8th Edition - Pathologic: Stage IV (pTX, pNX, pM1) - Signed by [name], MD on 10/05/2020   10/16/2020 -  Chemotherapy    Patient is on Treatment Plan: MELANOMA NIVOLUMAB + IPILIMUMAB (1/3) Q21D / NIVOLUMAB Q14D       PHYSICAL EXAMINATION:  Vitals:   01/15/21 0405 01/15/21 0800  BP:    Pulse: 90   Resp: 19   Temp:  98.7 F (37.1 C)  SpO2: 98%    Filed Weights   01/13/21 2212  Weight: 77.1 kg    Intake/Output from previous day: 05/01 0701 - 05/02 0700 In: 2628.6 [I.V.:2130.1; IV Piggyback:498.5] Out: 2100 [Urine:2100]  SKIN: skin color, texture, turgor are normal, no rashes or significant lesions EYES: normal, Conjunctiva are pink and non-injected, sclera clear OROPHARYNX:no exudate, no erythema and lips, buccal mucosa, and tongue normal  LUNGS: clear to auscultation and percussion with normal breathing effort HEART: regular rate & rhythm and no murmurs and no lower extremity edema ABDOMEN:abdomen soft, non-tender and normal bowel sounds NEURO: Alert, confused, follows commands  LABORATORY DATA:  I have reviewed the data as listed CMP Latest Ref Rng & Units 01/15/2021 01/14/2021 01/13/2021  Glucose 70 - 99 mg/dL 113(H) 115(H) 148(H)  BUN 6 - 20 mg/dL 34(H) 25(H) 26(H)  Creatinine 0.61 - 1.24 mg/dL 3.14(H) 2.71(H) 2.72(H)  Sodium 135 - 145 mmol/L 140 134(L) 133(L)  Potassium 3.5 - 5.1 mmol/L 3.5 3.4(L) 2.7(LL)  Chloride 98 - 111 mmol/L 109 101 97(L)  CO2 22 - 32 mmol/L 22 23 22    Calcium 8.9 - 10.3 mg/dL 8.0(L) 7.4(L) 8.7(L)  Total Protein 6.5 - 8.1 g/dL 4.7(L) 4.4(L) 6.6  Total Bilirubin 0.3 - 1.2 mg/dL 0.8 1.2 1.8(H)  Alkaline Phos 38 - 126 U/L 63 69 101  AST 15 - 41 U/L 53(H) 56(H) 35  ALT 0 - 44 U/L 37 34 40    Lab Results  Component Value Date   WBC 4.6 01/15/2021   HGB 8.6 (L) 01/15/2021   HCT 25.9 (L) 01/15/2021   MCV 91.2 01/15/2021   PLT 137 (L) 01/15/2021   NEUTROABS 4.1 01/15/2021    CT Head Wo Contrast  Result Date: 01/13/2021 CLINICAL DATA:  Initial evaluation for acute delirium. EXAM: CT HEAD WITHOUT CONTRAST TECHNIQUE: Contiguous axial images were obtained from the base of the skull through the vertex without intravenous contrast. COMPARISON:  Recent CT from 12/18/2020. FINDINGS: Brain: Again seen are multiple hemorrhagic metastases scattered throughout both cerebral hemispheres. Overall, lesions are not appreciably changed or progressed as compared to most recent CT. Most prominent of these lesions positioned at the left parietal lobe in measures approximately 1.8 cm. Localized vasogenic edema about the preponderance of these lesions without midline shift or significant regional mass effect. No evidence for new/interval hemorrhage by CT. No appreciable infratentorial involvement. No acute large vessel territory infarct. Ventricles stable in size without hydrocephalus. No extra-axial fluid collection. Vascular: No hyperdense vessel. Skull: Scalp soft tissues demonstrate no acute finding. Post craniotomy [name] is noted at the left  calvarium. Sinuses/Orbits: Globes and orbital soft tissues demonstrate no acute finding. Visualized paranasal sinuses are clear. No mastoid effusion. Other: None. IMPRESSION: 1. No significant interval change in size and appearance of multiple hemorrhagic metastases scattered throughout both cerebral hemispheres, with similar localized vasogenic edema. No midline shift or significant regional mass effect. 2. No other acute  intracranial abnormality. Electronically Signed   By: Jeannine Boga M.D.   On: 01/13/2021 23:16   CT Head Wo Contrast  Result Date: 12/18/2020 CLINICAL DATA:  Fever.  History of metastatic melanoma to the brain. EXAM: CT HEAD WITHOUT CONTRAST TECHNIQUE: Contiguous axial images were obtained from the base of the skull through the vertex without intravenous contrast. COMPARISON:  10/25/2020 FINDINGS: Brain: Hyperdense lesion again noted in the posterior left parietal lobe measuring 1.5 cm, stable since prior study. Numerous other hyperdense metastases noted. Encephalomalacia within the previously seen area of hemorrhage in the right basal ganglia. No new areas of hemorrhage, mass effect or midline shift. No hydrocephalus. Vascular: No hyperdense vessel or unexpected calcification. Skull: No acute calvarial abnormality. Sinuses/Orbits: No acute findings Other: None IMPRESSION: Extensive hemorrhagic metastases throughout the brain compatible with metastatic melanoma. No significant change since prior study. Electronically Signed   By: Rolm Baptise M.D.   On: 12/18/2020 18:39   US RENAL  Result Date: 12/26/2020 CLINICAL DATA:  55 year old male with acute renal insufficiency. Metastatic melanoma. EXAM: RENAL / URINARY TRACT ULTRASOUND COMPLETE COMPARISON:  CT Abdomen and Pelvis 09/14/2020. FINDINGS: Right Kidney: Renal measurements: 13.3 x 6.7 x 8.8 cm = volume: 406 mL. Echogenicity within normal limits. No mass or hydronephrosis visualized. Left Kidney: Renal measurements: 13.5 x 7.3 by 6.9 cm = volume: 355 mL. Echogenicity within normal limits. No mass or hydronephrosis visualized. Bladder: Diminutive.  Appears normal for degree of bladder distention. Other: None. IMPRESSION: Normal ultrasound appearance of both kidneys and the urinary bladder. Electronically Signed   By: Genevie Ann M.D.   On: 12/26/2020 08:17   DG Chest Port 1 View  Result Date: 01/13/2021 CLINICAL DATA:  Unresponsive EXAM: PORTABLE  CHEST 1 VIEW COMPARISON:  12/18/2020 FINDINGS: The heart size and mediastinal contours are within normal limits. Both lungs are clear. The visualized skeletal structures are unremarkable. The lung volumes are low. These low lung volumes likely accentuate the upper mediastinum. IMPRESSION: Low lung volumes.  Otherwise, stable exam. Electronically Signed   By: Constance Holster M.D.   On: 01/13/2021 23:05   DG Chest Port 1 View  Result Date: 12/18/2020 CLINICAL DATA:  Fever EXAM: PORTABLE CHEST 1 VIEW COMPARISON:  None. FINDINGS: The heart size and mediastinal contours are within normal limits. Mildly increased hazy airspace opacity is seen the left lower lung. The visualized skeletal structures are unremarkable. IMPRESSION: Mildly hazy airspace opacity at the left lower lung which could be due to atelectasis or infectious etiology. Electronically Signed   By: Prudencio Pair M.D.   On: 12/18/2020 18:30   US BIOPSY (KIDNEY)  Result Date: 12/26/2020 INDICATION: 55 year old male with a history of melanoma metastatic to the brain and now acute kidney injury. Concern for drug related nephritis. He presents for random renal biopsy. EXAM: ULTRASOUND GUIDED RENAL BIOPSY COMPARISON:  None. MEDICATIONS: Fentanyl 50 mcg IV; Versed 4 mg IV ANESTHESIA/SEDATION: Total Moderate Sedation time 10 minutes The patient's vital signs and level of consciousness were monitored continuously by radiology nursing under my direct supervision. COMPLICATIONS: None immediate PROCEDURE: Informed written consent was obtained from the patient after a discussion of the risks, benefits  and alternatives to treatment. The patient understands and consents the procedure. A timeout was performed prior to the initiation of the procedure. Ultrasound scanning was performed of the bilateral flanks. The inferior pole of the right kidney was selected for biopsy due to location and sonographic window. The procedure was planned. The operative site was prepped  and draped in the usual sterile fashion. The overlying soft tissues were anesthetized with 1% lidocaine with epinephrine. A 17 gauge core needle biopsy device was advanced into the inferior cortex of the right kidney and 2 core biopsies were obtained under direct ultrasound guidance. Images were saved for documentation purposes. The biopsy device was removed and hemostasis was obtained with manual compression. Post procedural scanning was negative for significant post procedural hemorrhage or additional complication. A dressing was placed. The patient tolerated the procedure well without immediate post procedural complication. IMPRESSION: Technically successful ultrasound guided right renal biopsy. Electronically Signed   By: Jacqulynn Cadet M.D.   On: 12/26/2020 14:14    ASSESSMENT AND PLAN: 1. 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, unknown primary tumor site  CT brain 09/14/2020-multiple hemorrhagic metastases with surrounding edema in the bilateral cerebral hemispheres  CTs chest, abdomen, pelvis 09/14/2020-1.4 cm right upper lobe subpleural nodule, 0.5 cm right lower lobe nodule, multiple hypodensities in the liver suspicious for metastases, 1.5 cm right upper pole hypodense kidney lesion-indeterminate  MRI abdomen 09/15/2020-multifocal T1 hyperintense liver lesions, no kidney mass  Ultrasound-guided biopsy of a segment 4B liver lesion 09/18/2020-benign liver parenchyma with steatosis, no evidence of malignancy  PET 09/25/2020-focal area of hypermetabolism in the right hilum, multiple areas of the liver, pelvic musculature, and left thigh-no CT correlate with any of these areas on the noncontrast CT, 10 mm anterior right upper lobe nodule with low-level hypermetabolism, additional tiny pulmonary nodules too small to characterize by PET  SRS to 10 brain lesions 09/27/2020  Resection of left occipital and left frontal lesions on 09/29/2020-pathology consistent with  malignant melanoma  Cycle 1 ipilimumab/nivolumab 10/16/2020  CT of the brain without contrast 10/26/2020 showed significant interval increase in the number and size of numerous metastases  Brain MRI 10/24/2020-multiple new and increased brain metastases, moderate edema surrounding multiple lesions, new mass-effect at the right lateral ventricle  Brain MRI 11/01/2020-stable appearance of his multiple intracranial metastases and associated edema and mild mass-effect.  CT of the chest with contrast 11/01/2020-interval progression of bilateral pulmonary nodules concerning for metastatic disease, small right hilar lymph nodes, new and progressive ill-defined lesions in the liver compatible with progression of metastatic involvement.  Cycle 2 nivolumab 11/10/2020  Cycle 3 nivolumab 11/27/2020  MRI brain 12/13/2020-decrease in size of hemorrhagic brain metastases, improvement in vasogenic edema and mass-effect, new areas of nonhemorrhagic enhancement by frontal and right parietal white matter-nonspecific  CT chest 12/08/2020-decreased size of lung and liver metastases 2.Left-sided weakness, new onset seizure secondary to #1, weakness has resolved 3.Hyperlipidemia 4.Elevated liver enzymes and bilirubin  Liver enzymes persistently elevated 5.Headache and nausea/vomiting secondary to #1, resolved 6. Hospital admission 10/20/2020-encephalopathy, slow Decadron taper-down to 2 mg at discharge 11/09/2020 7.  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 [facility] admission 01/13/2021- sepsis of unclear source, AKI  Sean [name] has been admitted secondary to sepsis.  He is currently afebrile.  Chest x-ray negative for pneumonia and cultures are negative to date.  He remains on IV antibiotics.  The patient's renal function is worse today.  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  He is receiving Solu-Cortef.  We  will consider nephrology consult.  CT of the head performed this admission shows stable disease.  Recommendations: 1.  Continue seizure medication. 2.  Continue steroids. 3.  Recommend renal consult due to worsening renal function. 4.  We will continue to hold nivolumab at this time. 4.  Monitor hemoglobin   LOS: 1 day   Sean Bussing, DNP, AGPCNP-BC, AOCNP 01/15/21 Mr. Blumenberg was interviewed and examined.  He was admitted with a high fever, nausea/vomiting, and hypotension on 01/13/2021.  He is well-known to me with a history of metastatic melanoma, currently maintained off of specific therapy.  He is alert and his mental status appears at baseline today.  He remains confused.  He moves all extremities to command.  The etiology of the acute presentation is unclear.  He was diagnosed with nivolumab induced nephritis during the last hospital admission and had been maintained on prednisone prior to hospital admission.  Creatinine is higher, potentially related to hypotension/sepsis.  I recommend continuing hydration and antibiotics.  We should reconsult Dr. [name] for management of the elevated creatinine.  The hemoglobin has dropped significantly compared to when he was hospitalized earlier this month.  This may be related to sepsis.  No gross bleeding reported.  I will continue following Mr. Fearn in the hospital.  Outpatient follow-up will be scheduled at the Cancer center.  I was present for greater than 50% of today's visit.  I performed medical decision making.

## 2021-01-15 NOTE — TOC Initial Note (Signed)
Transition of Care [facility]) - Initial/Assessment Note    Patient Details  Name: Sean [name] MRN: [id number] Date of Birth: June 10, 1966  Transition of Care [facility]) CM/SW Contact:    Leeroy Cha, RN Phone Number: 01/15/2021, 9:29 AM  Clinical Narrative:                 55 y.o.malewithhistory of metastatic melanoma including metastasis to the brain with seizure and cognitive impairment secondary to mets to the brain admitted for sepsis with pneumonia was found to be unresponsive in his wheelchair by nursing home resident and was brought to the ER. Patient's wife states that he was at his baseline mental status 1 day PTA, when she checked on him at the facility. Last few days patient has been having persistent nausea, vomiting unable to keep his medications. Has been doing poorly last few days. 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 In the ER patient was hypotensive, tachycardic and febrile with temperature of 103 F, chest x-ray unremarkable UA unremarkable CT head shows hemorrhagic metastasis. COVID test is negative. Labs show potassium of 2.7, creatinine of 2.7, WBC of 17, lactic acid to be normal. Patient was empirically started on antibiotics with unknown source. Admitted for further work-up of sepsis.   Today, patient noted to be more alert, awake, intermittently confused.  Wife at bedside.  Patient denies any chest pain, abdominal pain, further nausea/vomiting, short of breath.  BP continues to remain soft.  PLAN: to return to [place] when stable   Expected Discharge Plan: [place] Barriers to Discharge: Continued Medical Work up   Patient Goals and CMS Choice Patient states their goals for this hospitalization and ongoing recovery are:: unable to state      Expected Discharge Plan and Services Expected Discharge Plan: [place]   Discharge  Planning Services: CM Consult   Living arrangements for the past 2 months: [place]                                      Prior Living Arrangements/Services Living arrangements for the past 2 months: [place] Lives with:: Facility Resident Patient language and need for interpreter reviewed:: Yes Do you feel safe going back to the place where you live?: Yes      Need for Family Participation in Patient Care: Yes (Comment) Care giver support system in place?: Yes (comment)   Criminal Activity/Legal Involvement Pertinent to Current Situation/Hospitalization: No - Comment as needed  Activities of Daily Living Home Assistive Devices/Equipment: Wheelchair ADL Screening (condition at time of admission) Patient's cognitive ability adequate to safely complete daily activities?: No Is the patient deaf or have difficulty hearing?: No Does the patient have difficulty seeing, even when wearing glasses/contacts?: No Does the patient have difficulty concentrating, remembering, or making decisions?: Yes Patient able to express need for assistance with ADLs?: No Does the patient have difficulty dressing or bathing?: Yes Independently performs ADLs?: No Communication: Needs assistance Is this a change from baseline?: Pre-admission baseline Dressing (OT): Needs assistance Is this a change from baseline?: Pre-admission baseline Grooming: Needs assistance Is this a change from baseline?: Pre-admission baseline Feeding: Needs assistance Is this a change from baseline?: Pre-admission baseline Bathing: Needs assistance Is this a change from baseline?: Pre-admission baseline Toileting: Needs assistance Is this a change from baseline?: Pre-admission baseline  In/Out Bed: Needs assistance Is this a change from baseline?: Pre-admission baseline Walks in Home: Needs assistance Is this a change from baseline?: Pre-admission baseline Does the patient have difficulty  walking or climbing stairs?: Yes Weakness of Legs: Both Weakness of Arms/Hands: Both  Permission Sought/Granted                  Emotional Assessment Appearance:: Appears stated age     Orientation: : Fluctuating Orientation (Suspected and/or reported Sundowners) Alcohol / Substance Use: Not Applicable Psych Involvement: No (comment)  Admission diagnosis:  Septic shock ([place]) [A41.9, R65.21] Sepsis ([place]) [A41.9] Patient Active Problem List   Diagnosis Date Noted  . Sepsis ([place]) 01/14/2021  . 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 Malnutrition of moderate degree 12/21/2020  . Severe sepsis ([place]) 12/19/2020  . Pneumonia 12/18/2020  . Malignant melanoma ([place]) 10/26/2020  . Brain metastasis ([place]) 10/20/2020  . Melanoma of skin ([place]) 10/05/2020  . Status post craniotomy 09/29/2020  . Brain tumor ([place]) 09/29/2020  . Elevated blood pressure reading 09/15/2020  . Malignant melanoma metastatic to brain ([place]) 09/15/2020  . Uncomplicated alcohol dependence ([place])   . Seizure ([place])   . Hypokalemia   . Hyponatremia   . Elevated LFTs   . Brain metastases ([place]) 09/14/2020   PCP:  [name], MD Pharmacy:   [facility] [place] - [address] [place] [address] Phone: [phone] Fax: [phone]     Social Determinants of Health (SDOH) Interventions    Readmission Risk Interventions No flowsheet data found.

## 2021-01-15 NOTE — Progress Notes (Addendum)
PROGRESS NOTE  Sean [name] GUY:[id number] DOB: 1966/02/16 DOA: 01/13/2021 PCP: [name], MD  HPI/Recap of past 24 hours: HPI from Dr Liane Comber is a 55 y.o. 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  Patient's wife states that he was at his baseline mental status 1 day PTA, when she checked on him at the facility.  Last few days patient has been having persistent nausea, vomiting unable to keep his medications.  Has been doing poorly last few days. 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 In the ER patient was hypotensive, tachycardic and febrile with temperature of 103 F, chest x-ray unremarkable UA unremarkable CT head shows hemorrhagic metastasis. COVID test is negative. Labs show potassium of 2.7, creatinine of 2.7, WBC of 17, lactic acid to be normal. Patient was empirically started on antibiotics with unknown source.  Admitted for further work-up of sepsis.      Today, patient noted to be more talkative, agitated/restless, unable to have any meaningful conversation.     Assessment/Plan: Principal Problem:   Sepsis ([place]) Active Problems:   Brain metastases ([facility])   Seizure ([place])   Hypokalemia   Acute tubulointerstitial nephritis   Sepsis of unknown etiology ?HCAP On admission was febrile, tachycardic, tachypneic, with leukocytosis Currently afebrile, with no leukocytosis UA unremarkable, UC no growth BC x2 NGTD, LA WNL Procalcitonin 14.88-->5.96 Chest x-ray shows low lung volumes, otherwise unremarkable May need to obtain CT abdomen/pelvis as well as chest if no improvement within a day or so Continue IV vancomycin, cefepime  (pharmacy to renally dose) S/P aggressive IV fluids (received about 6-7 L of IVF-->will hold off for now since BP stable) Monitor closely  Acute metabolic encephalopathy Underlying cognitive impairment Currently at baseline Likely 2/2 above CT head showed stable extensive hemorrhagic mets, no acute intracranial abnormality Monitor closely  ??Possible secondary adrenal insufficiency ??Steroid withdrawal Patient was unable to keep any medications down for the past few days Continue stress dose steroid Continue IV fluids  AKI History of tubulointerstitial nephritis Bump in creatinine Noted significant dehydration on admission Baseline creatinine 1.4-2.2, on admission 2.71 S/p IV fluids May consult nephrology if no improvement Daily BMP  Hypotension BP now stable s/p aggressive IV fluids, stress dose steroids Multifactorial, sepsis, dehydration, possible steroid withdrawal Continue stress dose steroids  Hypokalemia Replace as needed  Anemia of chronic disease Hemoglobin baseline around 10-11 Noted hemoglobin drop, likely hemodilution from aggressive IV fluids No evidence of any bleeding Daily CBC  Metastatic melanoma with extensive mets including brain S/p craniotomy and resection of left occipital and left frontal lesion in 1/22 Follows Dr. 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   Weight as of this encounter: 77.1 kg.     Code Status: DNR  Family Communication: Discussed with wife at bedside on 01/14/2021  Disposition Plan: Status is: Inpatient  Remains inpatient appropriate because:Inpatient level of care appropriate due to severity of illness   Dispo: The patient is from: SNF  Anticipated d/c is to: SNF               Patient currently is not medically stable to d/c.   Difficult to place patient No   Consultants:  Oncology, Dr. [name]  Procedures:  None  Antimicrobials:  Vancomycin  Cefepime  DVT prophylaxis: SCDs   Objective: Vitals:   01/15/21 1000 01/15/21 1100 01/15/21 1200 01/15/21 1600  BP:  96/74 136/75   Pulse: 84 63 70 (!) 51  Resp: 17 18 20 15   Temp:   98.4 F (36.9 C)   TempSrc:   Oral   SpO2: 100% 100% 100% 97%  Weight:      Height:        Intake/Output Summary (Last 24 hours) at 01/15/2021 1718 Last data filed at 01/15/2021 0700 Gross per 24 hour  Intake 1468.98 ml  Output 1100 ml  Net 368.98 ml   Filed Weights   01/13/21 2212  Weight: 77.1 kg    Exam:  General: NAD, noted confusion, awake, talkative  Cardiovascular: S1, S2 present  Respiratory: CTAB  Abdomen: Soft, nontender, nondistended, bowel sounds present  Musculoskeletal: No bilateral pedal edema noted  Skin: Normal  Psychiatry:  Unable to assess   Data Reviewed: CBC: Recent Labs  Lab 01/13/21 2154 01/14/21 0426 01/15/21 0256  WBC 17.0* 6.9 4.6  NEUTROABS 15.1* 5.8 4.1  HGB 13.0 9.6* 8.6*  HCT 36.6* 27.8* 25.9*  MCV 86.7 89.4 91.2  PLT 267 139* 0000000*   Basic Metabolic Panel: Recent Labs  Lab 01/13/21 2154 01/14/21 0426 01/15/21 0256  NA 133* 134* 140  K 2.7* 3.4* 3.5  CL 97* 101 109  CO2 22 23 22   GLUCOSE 148* 115* 113*  BUN 26* 25* 34*  CREATININE 2.72* 2.71* 3.14*  CALCIUM 8.7* 7.4* 8.0*   GFR: Estimated Creatinine Clearance: 28.6 mL/min (A) (by C-G formula based on SCr of 3.14 mg/dL (H)). Liver Function Tests: Recent Labs  Lab 01/13/21 2154 01/14/21 0426 01/15/21 0256  AST 35 56* 53*  ALT 40 34 37  ALKPHOS 101 69 63  BILITOT 1.8* 1.2 0.8  PROT 6.6 4.4* 4.7*  ALBUMIN 3.2* 2.2* 2.3*   No results for input(s): LIPASE, AMYLASE in the last 168 hours. No results for input(s): AMMONIA in the last 168 hours. Coagulation Profile: Recent Labs  Lab  01/13/21 2154 01/14/21 0426  INR 1.0 1.2   Cardiac Enzymes: No results for input(s): CKTOTAL, CKMB, CKMBINDEX, TROPONINI in the last 168 hours. BNP (last 3 results) No results for input(s): PROBNP in the last 8760 hours. HbA1C: No results for input(s): HGBA1C in the last 72 hours. CBG: Recent Labs  Lab 01/14/21 0540 01/14/21 1739 01/15/21 0056 01/15/21 0508 01/15/21 1200  GLUCAP 112* 94 112* 105* 94   Lipid Profile: No results for input(s): CHOL, HDL, LDLCALC, TRIG, CHOLHDL, LDLDIRECT in the last 72 hours. Thyroid Function Tests: No results for input(s): TSH, T4TOTAL, FREET4, T3FREE, THYROIDAB in the last 72 hours. Anemia Panel: No results for input(s): VITAMINB12, FOLATE, FERRITIN, TIBC, IRON, RETICCTPCT in the last 72 hours. Urine analysis:    Component Value Date/Time   COLORURINE YELLOW 01/13/2021 2154   APPEARANCEUR CLEAR 01/13/2021 2154   LABSPEC 1.011 01/13/2021 2154   PHURINE 5.0 01/13/2021 2154   GLUCOSEU NEGATIVE 01/13/2021 2154   HGBUR NEGATIVE 01/13/2021 2154   BILIRUBINUR NEGATIVE 01/13/2021 2154   KETONESUR 20 (A) 01/13/2021 2154   PROTEINUR NEGATIVE 01/13/2021 2154   NITRITE NEGATIVE 01/13/2021 2154   LEUKOCYTESUR NEGATIVE 01/13/2021  2154   Sepsis Labs: @LABRCNTIP (procalcitonin:4,lacticidven:4)  ) Recent Results (from the past 240 hour(s))  Resp Panel by RT-PCR (Flu A&B, Covid) Nasopharyngeal Swab     Status: None   Collection Time: 01/13/21  9:54 PM   Specimen: Nasopharyngeal Swab; Nasopharyngeal(NP) swabs in vial transport medium  Result Value Ref Range Status   SARS Coronavirus 2 by RT PCR NEGATIVE NEGATIVE Final    Comment: (NOTE) SARS-CoV-2 target nucleic acids are NOT DETECTED.  The SARS-CoV-2 RNA is generally detectable in upper respiratory specimens during the acute phase of infection. The lowest concentration of SARS-CoV-2 viral copies this assay can detect is 138 copies/mL. A negative result does not preclude SARS-Cov-2 infection and  should not be used as the sole basis for treatment or other patient management decisions. A negative result may occur with  improper specimen collection/handling, submission of specimen other than nasopharyngeal swab, presence of viral mutation(s) within the areas targeted by this assay, and inadequate number of viral copies(<138 copies/mL). A negative result must be combined with clinical observations, patient history, and epidemiological information. The expected result is Negative.  Fact Sheet for Patients:  EntrepreneurPulse.com.au  Fact Sheet for Healthcare Providers:  IncredibleEmployment.be  This test is no t yet approved or cleared by the [place] FDA and  has been authorized for detection and/or diagnosis of SARS-CoV-2 by FDA under an Emergency Use Authorization (EUA). This EUA will remain  in effect (meaning this test can be used) for the duration of the COVID-19 declaration under Section 564(b)(1) of the Act, 21 U.S.C.section 360bbb-3(b)(1), unless the authorization is terminated  or revoked sooner.       Influenza A by PCR NEGATIVE NEGATIVE Final   Influenza B by PCR NEGATIVE NEGATIVE Final    Comment: (NOTE) The Xpert Xpress SARS-CoV-2/FLU/RSV plus assay is intended as an aid in the diagnosis of influenza from Nasopharyngeal swab specimens and should not be used as a sole basis for treatment. Nasal washings and aspirates are unacceptable for Xpert Xpress SARS-CoV-2/FLU/RSV testing.  Fact Sheet for Patients: EntrepreneurPulse.com.au  Fact Sheet for Healthcare Providers: IncredibleEmployment.be  This test is not yet approved or cleared by the [place] FDA and has been authorized for detection and/or diagnosis of SARS-CoV-2 by FDA under an Emergency Use Authorization (EUA). This EUA will remain in effect (meaning this test can be used) for the duration of the COVID-19 declaration  under Section 564(b)(1) of the Act, 21 U.S.C. section 360bbb-3(b)(1), unless the authorization is terminated or revoked.  Performed at [facility], [place] [address], [place], [place] [address]   Blood Culture (routine x 2)     Status: None (Preliminary result)   Collection Time: 01/13/21  9:54 PM   Specimen: BLOOD  Result Value Ref Range Status   Specimen Description   Final    BLOOD RIGHT ANTECUBITAL Performed at [facility] [address], [place], [place] [address]    Special Requests   Final    BOTTLES DRAWN AEROBIC ONLY Blood Culture results may not be optimal due to an inadequate volume of blood received in culture bottles Performed at [facility] [address], [place], [place] [address]    Culture   Final    NO GROWTH 1 DAY Performed at [facility], [place] [address], [place],  [address]    Report Status PENDING  Incomplete  Urine culture     Status: None   Collection Time: 01/13/21  9:54 PM  Specimen: In/Out Cath Urine  Result Value Ref Range Status   Specimen Description   Final    IN/OUT CATH URINE Performed at [place] [address] [place], [place] [address]    Special Requests   Final    NONE Performed at [facility], [place] [address], [place], [place] [address]    Culture   Final    NO GROWTH Performed at [facility], [place] [address] [place], [place] [address]    Report Status 01/15/2021 FINAL  Final  Blood Culture (routine x 2)     Status: None (Preliminary result)   Collection Time: 01/13/21  9:59 PM   Specimen: BLOOD  Result Value Ref Range Status   Specimen Description   Final    BLOOD LEFT ANTECUBITAL Performed at [facility] [address], [place], [place] [address]    Special Requests   Final    BOTTLES DRAWN AEROBIC AND ANAEROBIC Blood Culture adequate volume Performed at [facility] [address], [place], [place] [address]    Culture   Final    NO GROWTH 1 DAY Performed at [facility], [place] [address], [place], [place] [address]    Report Status PENDING  Incomplete  MRSA PCR Screening     Status: None   Collection Time: 01/15/21 11:45 AM   Specimen: Nasopharyngeal  Result Value Ref Range Status   MRSA by PCR NEGATIVE NEGATIVE Final    Comment:        The GeneXpert MRSA Assay (FDA approved for NASAL specimens only), is one component of a comprehensive MRSA colonization surveillance program. It is not intended to diagnose MRSA infection nor to guide or monitor treatment for MRSA infections. Performed at [facility], [place] [address] [place], [place] [address]       Studies: No results found.  Scheduled Meds: . Chlorhexidine Gluconate Cloth  6 each Topical Daily  . citalopram  10 mg Oral QHS  . hydrocortisone sod succinate (SOLU-CORTEF) inj  50 mg Intravenous Q8H  . mouth rinse  15 mL Mouth Rinse BID  . pantoprazole  40 mg Oral Daily  . senna-docusate  1 tablet Oral QHS    Continuous Infusions: . sodium chloride 100 mL/hr at 01/15/21 1103  . ceFEPime (MAXIPIME) IV    . levETIRAcetam Stopped (01/15/21 0520)  . metronidazole Stopped (01/15/21 1552)  . vancomycin Stopped (01/15/21 1122)     LOS: 1 day     Alma Friendly, MD [facility]  If 7PM-7AM, please contact night-coverage [email or web address] 01/15/2021, 5:18 PM

## 2021-01-16 DIAGNOSIS — E876 Hypokalemia: Secondary | ICD-10-CM | POA: Diagnosis not present

## 2021-01-16 DIAGNOSIS — A419 Sepsis, unspecified organism: Secondary | ICD-10-CM | POA: Diagnosis not present

## 2021-01-16 DIAGNOSIS — C7931 Secondary malignant neoplasm of brain: Secondary | ICD-10-CM | POA: Diagnosis not present

## 2021-01-16 DIAGNOSIS — N10 Acute tubulo-interstitial nephritis: Secondary | ICD-10-CM | POA: Diagnosis not present

## 2021-01-16 LAB — CBC WITH DIFFERENTIAL/PLATELET
Abs Immature Granulocytes: 0.03 10*3/uL (ref 0.00–0.07)
Basophils Absolute: 0 10*3/uL (ref 0.0–0.1)
Basophils Relative: 1 %
Eosinophils Absolute: 0 10*3/uL (ref 0.0–0.5)
Eosinophils Relative: 0 %
HCT: 28.6 % — ABNORMAL LOW (ref 39.0–52.0)
Hemoglobin: 9.4 g/dL — ABNORMAL LOW (ref 13.0–17.0)
Immature Granulocytes: 1 %
Lymphocytes Relative: 7 %
Lymphs Abs: 0.4 10*3/uL — ABNORMAL LOW (ref 0.7–4.0)
MCH: 29.9 pg (ref 26.0–34.0)
MCHC: 32.9 g/dL (ref 30.0–36.0)
MCV: 91.1 fL (ref 80.0–100.0)
Monocytes Absolute: 0.3 10*3/uL (ref 0.1–1.0)
Monocytes Relative: 6 %
Neutro Abs: 4.8 10*3/uL (ref 1.7–7.7)
Neutrophils Relative %: 85 %
Platelets: 188 10*3/uL (ref 150–400)
RBC: 3.14 MIL/uL — ABNORMAL LOW (ref 4.22–5.81)
RDW: 12.8 % (ref 11.5–15.5)
WBC: 5.6 10*3/uL (ref 4.0–10.5)
nRBC: 0 % (ref 0.0–0.2)

## 2021-01-16 LAB — GLUCOSE, CAPILLARY
Glucose-Capillary: 100 mg/dL — ABNORMAL HIGH (ref 70–99)
Glucose-Capillary: 116 mg/dL — ABNORMAL HIGH (ref 70–99)
Glucose-Capillary: 96 mg/dL (ref 70–99)
Glucose-Capillary: 97 mg/dL (ref 70–99)

## 2021-01-16 LAB — COMPREHENSIVE METABOLIC PANEL
ALT: 37 U/L (ref 0–44)
AST: 36 U/L (ref 15–41)
Albumin: 2.5 g/dL — ABNORMAL LOW (ref 3.5–5.0)
Alkaline Phosphatase: 65 U/L (ref 38–126)
Anion gap: 9 (ref 5–15)
BUN: 35 mg/dL — ABNORMAL HIGH (ref 6–20)
CO2: 22 mmol/L (ref 22–32)
Calcium: 8.2 mg/dL — ABNORMAL LOW (ref 8.9–10.3)
Chloride: 110 mmol/L (ref 98–111)
Creatinine, Ser: 2.6 mg/dL — ABNORMAL HIGH (ref 0.61–1.24)
GFR, Estimated: 28 mL/min — ABNORMAL LOW (ref >60)
Glucose, Bld: 117 mg/dL — ABNORMAL HIGH (ref 70–99)
Potassium: 3.1 mmol/L — ABNORMAL LOW (ref 3.5–5.1)
Sodium: 141 mmol/L (ref 135–145)
Total Bilirubin: 1 mg/dL (ref 0.3–1.2)
Total Protein: 5.1 g/dL — ABNORMAL LOW (ref 6.5–8.1)

## 2021-01-16 LAB — PROCALCITONIN: Procalcitonin: 2.22 ng/mL

## 2021-01-16 LAB — C DIFFICILE QUICK SCREEN W PCR REFLEX
C Diff antigen: NEGATIVE
C Diff interpretation: NOT DETECTED
C Diff toxin: NEGATIVE

## 2021-01-16 MED ORDER — HYDROCORTISONE NA SUCCINATE PF 100 MG IJ SOLR
50.0000 mg | Freq: Two times a day (BID) | INTRAMUSCULAR | Status: DC
  Administered 2021-01-16 – 2021-01-18 (×4): 50 mg via INTRAVENOUS
  Filled 2021-01-16 (×4): qty 2

## 2021-01-16 MED ORDER — POTASSIUM CHLORIDE CRYS ER 20 MEQ PO TBCR
40.0000 meq | EXTENDED_RELEASE_TABLET | Freq: Two times a day (BID) | ORAL | Status: AC
  Administered 2021-01-16 (×2): 40 meq via ORAL
  Filled 2021-01-16 (×2): qty 2

## 2021-01-16 MED ORDER — SODIUM CHLORIDE 0.9 % IV SOLN
2.0000 g | Freq: Two times a day (BID) | INTRAVENOUS | Status: AC
  Administered 2021-01-16 – 2021-01-18 (×5): 2 g via INTRAVENOUS
  Filled 2021-01-16 (×5): qty 2

## 2021-01-16 NOTE — Progress Notes (Signed)
Pharmacy Antibiotic Note  Sean [name] is a 55 y.o. male admitted on 01/13/2021 with sepsis.  Pharmacy has been consulted for Cefepime & Vancomycin dosing.  Day 3 cefepime/flagyl SCr 2.6 improved - est CrCl 35 WBC improved  Plan:  Change cefepime from 2g IV q24 to 2g IV q12 per current renal function  Monitor renal function and cx data   Flagyl per Md  Cultures negative to date  Would suggest 5 day course of abx's - stop after 5/5 doses   Height: 6' (182.9 cm) Weight: 77.1 kg (170 lb) IBW/kg (Calculated) : 77.6  Temp (24hrs), Avg:97.7 F (36.5 C), Min:96.3 F (35.7 C), Max:98.4 F (36.9 C)  Recent Labs  Lab 01/13/21 2154 01/14/21 0426 01/15/21 0256 01/16/21 0247  WBC 17.0* 6.9 4.6 5.6  CREATININE 2.72* 2.71* 3.14* 2.60*  LATICACIDVEN 1.6 1.5  --   --     Estimated Creatinine Clearance: 34.6 mL/min (A) (by C-G formula based on SCr of 2.6 mg/dL (H)).    No Known Allergies  Antimicrobials this admission: 4/30 Cefepime >>  4/30 Vancomycin >> 5/2 4/30 Flagyl >>  Dose adjustments this admission:  Microbiology results: 4/30 BCx: ngtd 4/30 UCx:  ngtd 4/30 Resp PCR: negative for COVID, influenza  Thank you for allowing pharmacy to be a part of this patient's care.  Kara Mead PharmD, BCPS 01/16/2021 10:07 AM

## 2021-01-16 NOTE — Progress Notes (Signed)
PROGRESS NOTE  Sean [name] FWY:[id number] DOB: 25-Nov-1965 DOA: 01/13/2021 PCP: [name], MD  HPI/Recap of past 24 hours: HPI from Dr Liane Comber is a 55 y.o. 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  Patient's wife states that he was at his baseline mental status 1 day PTA, when she checked on him at the facility.  Last few days patient has been having persistent nausea, vomiting unable to keep his medications.  Has been doing poorly last few days. 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 In the ER patient was hypotensive, tachycardic and febrile with temperature of 103 F, chest x-ray unremarkable UA unremarkable CT head shows hemorrhagic metastasis. COVID test is negative. Labs show potassium of 2.7, creatinine of 2.7, WBC of 17, lactic acid to be normal. Patient was empirically started on antibiotics with unknown source.  Admitted for further work-up of sepsis.      Today, patient noted to be resting comfortably in bed.     Assessment/Plan: Principal Problem:   Sepsis ([place]) Active Problems:   Brain metastases ([facility])   Seizure ([place])   Hypokalemia   Acute tubulointerstitial nephritis   Sepsis of unknown etiology On admission was febrile, tachycardic, tachypneic, with leukocytosis Currently afebrile, with no leukocytosis UA unremarkable, UC no growth BC x2 NGTD, LA WNL Procalcitonin 14.88-->5.96 Chest x-ray shows low lung volumes, otherwise unremarkable Continue IV cefepime (pharmacy to renally dose), plan to complete 5 days of cefepime and flagyl given immunocompromised state, discontinue vancomycin S/P aggressive IV fluids (received  about 6-7 L of IVF, BP stable) Monitor closely  Acute metabolic encephalopathy Underlying cognitive impairment Currently at baseline Likely 2/2 above CT head showed stable extensive hemorrhagic mets, no acute intracranial abnormality Monitor closely  ??Possible secondary adrenal insufficiency ??Steroid withdrawal Patient was unable to keep any medications down, including steriods for the past few days PTA Start to taper stress dose steroid, once done, may resume home dose of steroids S/P IV fluids  AKI History of tubulointerstitial nephritis Noted significant dehydration on admission Baseline creatinine 1.4-2.2, on admission 2.71 S/p IV fluids May consult nephrology if no significant improvement in the next few days Daily BMP  Hypotension BP now stable s/p aggressive IV fluids, stress dose steroids Multifactorial, sepsis, dehydration, possible steroid withdrawal Continue stress dose steroids  Hypokalemia Replace as needed  Anemia of chronic disease Hemoglobin baseline around 10-11 Noted hemoglobin drop, likely hemodilution from aggressive IV fluids No evidence of any bleeding Daily CBC  Metastatic melanoma with extensive mets including brain S/p craniotomy and resection of left occipital and left frontal lesion in 1/22 Follows Dr. 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   Weight as of this encounter: 77.1 kg.     Code Status: DNR  Family Communication: Discussed with wife at bedside on 01/14/2021  Disposition Plan: Status is: Inpatient  Remains inpatient appropriate because:Inpatient level of care appropriate due to severity of illness   Dispo: The patient is from: SNF  Anticipated d/c is to: SNF              Patient  currently is not medically stable to d/c.   Difficult to place patient No   Consultants:  Oncology, Dr. [name]  Procedures:  None  Antimicrobials:  Cefepime  Metronidazole  DVT prophylaxis: SCDs   Objective: Vitals:   01/16/21 0800 01/16/21 0820 01/16/21 0840 01/16/21 1239  BP:  137/78    Pulse:   (!) 49   Resp: 14  15   Temp: (!) 96.3 F (35.7 C)   (!) 97.4 F (36.3 C)  TempSrc: Axillary   Axillary  SpO2: 98%  99%   Weight:      Height:        Intake/Output Summary (Last 24 hours) at 01/16/2021 1436 Last data filed at 01/16/2021 1191 Gross per 24 hour  Intake 1771.06 ml  Output 1850 ml  Net -78.94 ml   Filed Weights   01/13/21 2212  Weight: 77.1 kg    Exam:  General: NAD, noted cognitive impairment, at baseline  Cardiovascular: S1, S2 present  Respiratory: CTAB  Abdomen: Soft, nontender, nondistended, bowel sounds present  Musculoskeletal: No bilateral pedal edema noted  Skin: Normal  Psychiatry:  Unable to assess   Data Reviewed: CBC: Recent Labs  Lab 01/13/21 2154 01/14/21 0426 01/15/21 0256 01/16/21 0247  WBC 17.0* 6.9 4.6 5.6  NEUTROABS 15.1* 5.8 4.1 4.8  HGB 13.0 9.6* 8.6* 9.4*  HCT 36.6* 27.8* 25.9* 28.6*  MCV 86.7 89.4 91.2 91.1  PLT 267 139* 137* 478   Basic Metabolic Panel: Recent Labs  Lab 01/13/21 2154 01/14/21 0426 01/15/21 0256 01/16/21 0247  NA 133* 134* 140 141  K 2.7* 3.4* 3.5 3.1*  CL 97* 101 109 110  CO2 22 23 22 22   GLUCOSE 148* 115* 113* 117*  BUN 26* 25* 34* 35*  CREATININE 2.72* 2.71* 3.14* 2.60*  CALCIUM 8.7* 7.4* 8.0* 8.2*   GFR: Estimated Creatinine Clearance: 34.6 mL/min (A) (by C-G formula based on SCr of 2.6 mg/dL (H)). Liver Function Tests: Recent Labs  Lab 01/13/21 2154 01/14/21 0426 01/15/21 0256 01/16/21 0247  AST 35 56* 53* 36  ALT 40 34 37 37  ALKPHOS 101 69 63 65  BILITOT 1.8* 1.2 0.8 1.0  PROT 6.6 4.4* 4.7* 5.1*  ALBUMIN 3.2* 2.2* 2.3* 2.5*   No results for input(s):  LIPASE, AMYLASE in the last 168 hours. No results for input(s): AMMONIA in the last 168 hours. Coagulation Profile: Recent Labs  Lab 01/13/21 2154 01/14/21 0426  INR 1.0 1.2   Cardiac Enzymes: No results for input(s): CKTOTAL, CKMB, CKMBINDEX, TROPONINI in the last 168 hours. BNP (last 3 results) No results for input(s): PROBNP in the last 8760 hours. HbA1C: No results for input(s): HGBA1C in the last 72 hours. CBG: Recent Labs  Lab 01/15/21 1200 01/15/21 1706 01/15/21 2328 01/16/21 0534 01/16/21 1237  GLUCAP 94 92 114* 96 100*   Lipid Profile: No results for input(s): CHOL, HDL, LDLCALC, TRIG, CHOLHDL, LDLDIRECT in the last 72 hours. Thyroid Function Tests: No results for input(s): TSH, T4TOTAL, FREET4, T3FREE, THYROIDAB in the last 72 hours. Anemia Panel: No results for input(s): VITAMINB12, FOLATE, FERRITIN, TIBC, IRON, RETICCTPCT in the last 72 hours. Urine analysis:    Component Value Date/Time   COLORURINE YELLOW 01/13/2021 2154   APPEARANCEUR CLEAR 01/13/2021 2154   LABSPEC 1.011 01/13/2021 2154   PHURINE 5.0 01/13/2021 2154   GLUCOSEU NEGATIVE 01/13/2021 2154   HGBUR NEGATIVE  01/13/2021 2154   [place] NEGATIVE 01/13/2021 2154   KETONESUR 20 (A) 01/13/2021 2154   PROTEINUR NEGATIVE 01/13/2021 2154   NITRITE NEGATIVE 01/13/2021 2154   LEUKOCYTESUR NEGATIVE 01/13/2021 2154   Sepsis Labs: @LABRCNTIP (procalcitonin:4,lacticidven:4)  ) Recent Results (from the past 240 hour(s))  Resp Panel by RT-PCR (Flu A&B, Covid) Nasopharyngeal Swab     Status: None   Collection Time: 01/13/21  9:54 PM   Specimen: Nasopharyngeal Swab; Nasopharyngeal(NP) swabs in vial transport medium  Result Value Ref Range Status   SARS Coronavirus 2 by RT PCR NEGATIVE NEGATIVE Final    Comment: (NOTE) SARS-CoV-2 target nucleic acids are NOT DETECTED.  The SARS-CoV-2 RNA is generally detectable in upper respiratory specimens during the acute phase of infection. The  lowest concentration of SARS-CoV-2 viral copies this assay can detect is 138 copies/mL. A negative result does not preclude SARS-Cov-2 infection and should not be used as the sole basis for treatment or other patient management decisions. A negative result may occur with  improper specimen collection/handling, submission of specimen other than nasopharyngeal swab, presence of viral mutation(s) within the areas targeted by this assay, and inadequate number of viral copies(<138 copies/mL). A negative result must be combined with clinical observations, patient history, and epidemiological information. The expected result is Negative.  Fact Sheet for Patients:  EntrepreneurPulse.com.au  Fact Sheet for Healthcare Providers:  IncredibleEmployment.be  This test is no t yet approved or cleared by the [place] FDA and  has been authorized for detection and/or diagnosis of SARS-CoV-2 by FDA under an Emergency Use Authorization (EUA). This EUA will remain  in effect (meaning this test can be used) for the duration of the COVID-19 declaration under Section 564(b)(1) of the Act, 21 U.S.C.section 360bbb-3(b)(1), unless the authorization is terminated  or revoked sooner.       Influenza A by PCR NEGATIVE NEGATIVE Final   Influenza B by PCR NEGATIVE NEGATIVE Final    Comment: (NOTE) The Xpert Xpress SARS-CoV-2/FLU/RSV plus assay is intended as an aid in the diagnosis of influenza from Nasopharyngeal swab specimens and should not be used as a sole basis for treatment. Nasal washings and aspirates are unacceptable for Xpert Xpress SARS-CoV-2/FLU/RSV testing.  Fact Sheet for Patients: EntrepreneurPulse.com.au  Fact Sheet for Healthcare Providers: IncredibleEmployment.be  This test is not yet approved or cleared by the [place] FDA and has been authorized for detection and/or diagnosis of SARS-CoV-2 by FDA under  an Emergency Use Authorization (EUA). This EUA will remain in effect (meaning this test can be used) for the duration of the COVID-19 declaration under Section 564(b)(1) of the Act, 21 U.S.C. section 360bbb-3(b)(1), unless the authorization is terminated or revoked.  Performed at [facility], [place] [address], [place], [place] [address]   Blood Culture (routine x 2)     Status: None (Preliminary result)   Collection Time: 01/13/21  9:54 PM   Specimen: BLOOD  Result Value Ref Range Status   Specimen Description   Final    BLOOD RIGHT ANTECUBITAL Performed at [facility] [address] [place], [place] [address]    Special Requests   Final    BOTTLES DRAWN AEROBIC ONLY Blood Culture results may not be optimal due to an inadequate volume of blood received in culture bottles Performed at [facility] [address] [place], [place] [address]    Culture   Final    NO GROWTH 2 DAYS Performed at [facility]  [address] [place], [place] [address]    Report Status PENDING  Incomplete  Urine culture     Status: None   Collection Time: 01/13/21  9:54 PM   Specimen: In/Out Cath Urine  Result Value Ref Range Status   Specimen Description   Final    IN/OUT CATH URINE Performed at [facility] [address], [place], [place] [address]    Special Requests   Final    NONE Performed at [facility], [place] [address] [place], [place] [address]    Culture   Final    NO GROWTH Performed at [facility], [place] [address], [place], [place] [address]    Report Status 01/15/2021 FINAL  Final  Blood Culture (routine x 2)     Status: None (Preliminary result)   Collection Time: 01/13/21  9:59 PM   Specimen: BLOOD  Result Value Ref Range Status   Specimen Description   Final    BLOOD LEFT ANTECUBITAL Performed at [facility] [address] [place] [address] [place], [place]  [address]    Special Requests   Final    BOTTLES DRAWN AEROBIC AND ANAEROBIC Blood Culture adequate volume Performed at [facility] [address], [place], [place] [address]    Culture   Final    NO GROWTH 2 DAYS Performed at [facility] [address], [place], [place] [address]    Report Status PENDING  Incomplete  MRSA PCR Screening     Status: None   Collection Time: 01/15/21 11:45 AM   Specimen: Nasopharyngeal  Result Value Ref Range Status   MRSA by PCR NEGATIVE NEGATIVE Final    Comment:        The GeneXpert MRSA Assay (FDA approved for NASAL specimens only), is one component of a comprehensive MRSA colonization surveillance program. It is not intended to diagnose MRSA infection nor to guide or monitor treatment for MRSA infections. Performed at [facility], [place] [address], [place], [place] [address]       Studies: No results found.  Scheduled Meds: . Chlorhexidine Gluconate Cloth  6 each Topical Daily  . citalopram  10 mg Oral QHS  . hydrocortisone sod succinate (SOLU-CORTEF) inj  50 mg Intravenous Q8H  . mouth rinse  15 mL Mouth Rinse BID  . pantoprazole  40 mg Oral Daily  . potassium chloride  40 mEq Oral BID  . senna-docusate  1 tablet Oral QHS    Continuous Infusions: . ceFEPime (MAXIPIME) IV Stopped (01/16/21 1218)  . levETIRAcetam Stopped (01/16/21 0516)  . metronidazole Stopped (01/16/21 1696)     LOS: 2 days     Alma Friendly, MD [facility]  If 7PM-7AM, please contact night-coverage [email or web address] 01/16/2021, 2:36 PM

## 2021-01-16 NOTE — Progress Notes (Addendum)
HEMATOLOGY-ONCOLOGY PROGRESS NOTE  SUBJECTIVE: Remains confused. Nursing reports that he is not having loose stools.   Oncology History  Melanoma of skin ([place])  10/05/2020 Initial Diagnosis   Melanoma of skin ([place])   10/05/2020 Cancer Staging   Staging form: Melanoma of the Skin, AJCC 8th Edition - Pathologic: Stage IV (pTX, pNX, pM1) - Signed by [name], MD on 10/05/2020   10/16/2020 -  Chemotherapy    Patient is on Treatment Plan: MELANOMA NIVOLUMAB + IPILIMUMAB (1/3) Q21D / NIVOLUMAB Q14D       PHYSICAL EXAMINATION:  Vitals:   01/16/21 0400 01/16/21 0600  BP: 115/67 121/68  Pulse: (!) 55 (!) 50  Resp: 18 15  Temp:    SpO2: 98% 97%   Filed Weights   01/13/21 2212  Weight: 77.1 kg    Intake/Output from previous day: 05/02 0701 - 05/03 0700 In: 1771.1 [P.O.:100; I.V.:641.7; IV Piggyback:1029.4] Out: 1850 [Urine:1850]  SKIN: skin color, texture, turgor are normal, no rashes or significant lesions EYES: normal, Conjunctiva are pink and non-injected, sclera clear OROPHARYNX:no exudate, no erythema and lips, buccal mucosa, and tongue normal  LUNGS: clear to auscultation and percussion with normal breathing effort HEART: regular rate & rhythm and no murmurs and no lower extremity edema ABDOMEN:abdomen soft, non-tender and normal bowel sounds NEURO: Alert, confused, follows commands, moves all extremities to command  LABORATORY DATA:  I have reviewed the data as listed CMP Latest Ref Rng & Units 01/16/2021 01/15/2021 01/14/2021  Glucose 70 - 99 mg/dL 117(H) 113(H) 115(H)  BUN 6 - 20 mg/dL 35(H) 34(H) 25(H)  Creatinine 0.61 - 1.24 mg/dL 2.60(H) 3.14(H) 2.71(H)  Sodium 135 - 145 mmol/L 141 140 134(L)  Potassium 3.5 - 5.1 mmol/L 3.1(L) 3.5 3.4(L)  Chloride 98 - 111 mmol/L 110 109 101  CO2 22 - 32 mmol/L 22 22 23   Calcium 8.9 - 10.3 mg/dL 8.2(L) 8.0(L) 7.4(L)  Total Protein 6.5 - 8.1 g/dL 5.1(L) 4.7(L) 4.4(L)  Total Bilirubin 0.3 - 1.2 mg/dL 1.0 0.8 1.2  Alkaline Phos  38 - 126 U/L 65 63 69  AST 15 - 41 U/L 36 53(H) 56(H)  ALT 0 - 44 U/L 37 37 34    Lab Results  Component Value Date   WBC 5.6 01/16/2021   HGB 9.4 (L) 01/16/2021   HCT 28.6 (L) 01/16/2021   MCV 91.1 01/16/2021   PLT 188 01/16/2021   NEUTROABS 4.8 01/16/2021    CT Head Wo Contrast  Result Date: 01/13/2021 CLINICAL DATA:  Initial evaluation for acute delirium. EXAM: CT HEAD WITHOUT CONTRAST TECHNIQUE: Contiguous axial images were obtained from the base of the skull through the vertex without intravenous contrast. COMPARISON:  Recent CT from 12/18/2020. FINDINGS: Brain: Again seen are multiple hemorrhagic metastases scattered throughout both cerebral hemispheres. Overall, lesions are not appreciably changed or progressed as compared to most recent CT. Most prominent of these lesions positioned at the left parietal lobe in measures approximately 1.8 cm. Localized vasogenic edema about the preponderance of these lesions without midline shift or significant regional mass effect. No evidence for new/interval hemorrhage by CT. No appreciable infratentorial involvement. No acute large vessel territory infarct. Ventricles stable in size without hydrocephalus. No extra-axial fluid collection. Vascular: No hyperdense vessel. Skull: Scalp soft tissues demonstrate no acute finding. Post craniotomy [name] is noted at the left calvarium. Sinuses/Orbits: Globes and orbital soft tissues demonstrate no acute finding. Visualized paranasal sinuses are clear. No mastoid effusion. Other: None. IMPRESSION: 1. No significant interval change in  size and appearance of multiple hemorrhagic metastases scattered throughout both cerebral hemispheres, with similar localized vasogenic edema. No midline shift or significant regional mass effect. 2. No other acute intracranial abnormality. Electronically Signed   By: Jeannine Boga M.D.   On: 01/13/2021 23:16   CT Head Wo Contrast  Result Date: 12/18/2020 CLINICAL DATA:   Fever.  History of metastatic melanoma to the brain. EXAM: CT HEAD WITHOUT CONTRAST TECHNIQUE: Contiguous axial images were obtained from the base of the skull through the vertex without intravenous contrast. COMPARISON:  10/25/2020 FINDINGS: Brain: Hyperdense lesion again noted in the posterior left parietal lobe measuring 1.5 cm, stable since prior study. Numerous other hyperdense metastases noted. Encephalomalacia within the previously seen area of hemorrhage in the right basal ganglia. No new areas of hemorrhage, mass effect or midline shift. No hydrocephalus. Vascular: No hyperdense vessel or unexpected calcification. Skull: No acute calvarial abnormality. Sinuses/Orbits: No acute findings Other: None IMPRESSION: Extensive hemorrhagic metastases throughout the brain compatible with metastatic melanoma. No significant change since prior study. Electronically Signed   By: Rolm Baptise M.D.   On: 12/18/2020 18:39   US RENAL  Result Date: 12/26/2020 CLINICAL DATA:  55 year old male with acute renal insufficiency. Metastatic melanoma. EXAM: RENAL / URINARY TRACT ULTRASOUND COMPLETE COMPARISON:  CT Abdomen and Pelvis 09/14/2020. FINDINGS: Right Kidney: Renal measurements: 13.3 x 6.7 x 8.8 cm = volume: 406 mL. Echogenicity within normal limits. No mass or hydronephrosis visualized. Left Kidney: Renal measurements: 13.5 x 7.3 by 6.9 cm = volume: 355 mL. Echogenicity within normal limits. No mass or hydronephrosis visualized. Bladder: Diminutive.  Appears normal for degree of bladder distention. Other: None. IMPRESSION: Normal ultrasound appearance of both kidneys and the urinary bladder. Electronically Signed   By: Genevie Ann M.D.   On: 12/26/2020 08:17   DG Chest Port 1 View  Result Date: 01/13/2021 CLINICAL DATA:  Unresponsive EXAM: PORTABLE CHEST 1 VIEW COMPARISON:  12/18/2020 FINDINGS: The heart size and mediastinal contours are within normal limits. Both lungs are clear. The visualized skeletal structures are  unremarkable. The lung volumes are low. These low lung volumes likely accentuate the upper mediastinum. IMPRESSION: Low lung volumes.  Otherwise, stable exam. Electronically Signed   By: Constance Holster M.D.   On: 01/13/2021 23:05   DG Chest Port 1 View  Result Date: 12/18/2020 CLINICAL DATA:  Fever EXAM: PORTABLE CHEST 1 VIEW COMPARISON:  None. FINDINGS: The heart size and mediastinal contours are within normal limits. Mildly increased hazy airspace opacity is seen the left lower lung. The visualized skeletal structures are unremarkable. IMPRESSION: Mildly hazy airspace opacity at the left lower lung which could be due to atelectasis or infectious etiology. Electronically Signed   By: Prudencio Pair M.D.   On: 12/18/2020 18:30   US BIOPSY (KIDNEY)  Result Date: 12/26/2020 INDICATION: 55 year old male with a history of melanoma metastatic to the brain and now acute kidney injury. Concern for drug related nephritis. He presents for random renal biopsy. EXAM: ULTRASOUND GUIDED RENAL BIOPSY COMPARISON:  None. MEDICATIONS: Fentanyl 50 mcg IV; Versed 4 mg IV ANESTHESIA/SEDATION: Total Moderate Sedation time 10 minutes The patient's vital signs and level of consciousness were monitored continuously by radiology nursing under my direct supervision. COMPLICATIONS: None immediate PROCEDURE: Informed written consent was obtained from the patient after a discussion of the risks, benefits and alternatives to treatment. The patient understands and consents the procedure. A timeout was performed prior to the initiation of the procedure. Ultrasound scanning was performed of the  bilateral flanks. The inferior pole of the right kidney was selected for biopsy due to location and sonographic window. The procedure was planned. The operative site was prepped and draped in the usual sterile fashion. The overlying soft tissues were anesthetized with 1% lidocaine with epinephrine. A 17 gauge core needle biopsy device was advanced  into the inferior cortex of the right kidney and 2 core biopsies were obtained under direct ultrasound guidance. Images were saved for documentation purposes. The biopsy device was removed and hemostasis was obtained with manual compression. Post procedural scanning was negative for significant post procedural hemorrhage or additional complication. A dressing was placed. The patient tolerated the procedure well without immediate post procedural complication. IMPRESSION: Technically successful ultrasound guided right renal biopsy. Electronically Signed   By: Jacqulynn Cadet M.D.   On: 12/26/2020 14:14    ASSESSMENT AND PLAN: 1. 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, unknown primary tumor site  CT brain 09/14/2020-multiple hemorrhagic metastases with surrounding edema in the bilateral cerebral hemispheres  CTs chest, abdomen, pelvis 09/14/2020-1.4 cm right upper lobe subpleural nodule, 0.5 cm right lower lobe nodule, multiple hypodensities in the liver suspicious for metastases, 1.5 cm right upper pole hypodense kidney lesion-indeterminate  MRI abdomen 09/15/2020-multifocal T1 hyperintense liver lesions, no kidney mass  Ultrasound-guided biopsy of a segment 4B liver lesion 09/18/2020-benign liver parenchyma with steatosis, no evidence of malignancy  PET 09/25/2020-focal area of hypermetabolism in the right hilum, multiple areas of the liver, pelvic musculature, and left thigh-no CT correlate with any of these areas on the noncontrast CT, 10 mm anterior right upper lobe nodule with low-level hypermetabolism, additional tiny pulmonary nodules too small to characterize by PET  SRS to 10 brain lesions 09/27/2020  Resection of left occipital and left frontal lesions on 09/29/2020-pathology consistent with malignant melanoma  Cycle 1 ipilimumab/nivolumab 10/16/2020  CT of the brain without contrast 10/26/2020 showed significant interval increase in the number and size of  numerous metastases  Brain MRI 10/24/2020-multiple new and increased brain metastases, moderate edema surrounding multiple lesions, new mass-effect at the right lateral ventricle  Brain MRI 11/01/2020-stable appearance of his multiple intracranial metastases and associated edema and mild mass-effect.  CT of the chest with contrast 11/01/2020-interval progression of bilateral pulmonary nodules concerning for metastatic disease, small right hilar lymph nodes, new and progressive ill-defined lesions in the liver compatible with progression of metastatic involvement.  Cycle 2 nivolumab 11/10/2020  Cycle 3 nivolumab 11/27/2020  MRI brain 12/13/2020-decrease in size of hemorrhagic brain metastases, improvement in vasogenic edema and mass-effect, new areas of nonhemorrhagic enhancement by frontal and right parietal white matter-nonspecific  CT chest 12/08/2020-decreased size of lung and liver metastases 2.Left-sided weakness, new onset seizure secondary to #1, weakness has resolved 3.Hyperlipidemia 4.Elevated liver enzymes and bilirubin  Liver enzymes persistently elevated 5.Headache and nausea/vomiting secondary to #1, resolved 6. Hospital admission 10/20/2020-encephalopathy, slow Decadron taper-down to 2 mg at discharge 11/09/2020 7.  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 [facility] admission 01/13/2021- sepsis of unclear source, AKI  Mr. Parkison appears improved.  He remains afebrile and cultures remain negative to date.  He remains on IV antibiotics.  His hemoglobin has improved.  Renal function has improved today.  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  He is receiving Solu-Cortef.    CT of the head performed this admission shows stable disease.  Recommendations: 1.  Continue seizure medication. 2.  Continue steroids, would resume outpatient prednisone given  for nephritis, dosing per  nephrology 3.  We will continue to hold nivolumab at this time. 4.  Monitor hemoglobin   LOS: 2 days   Mikey Bussing, DNP, AGPCNP-BC, AOCNP 01/16/21 Mr. Benscoter was interviewed and examined.  He appears unchanged.  No complaint.  He is afebrile.  Cultures remain negative to date.  No apparent source for infection.  His mental status appears at baseline. The creatinine is partially improved.  He can resume prednisone given for treatment of nephritis.  Please call oncology as needed on 01/17/2021.  I will check on him 01/18/2021.  I was present for greater than 50% of today's visit.  I performed medical decision making.

## 2021-01-17 ENCOUNTER — Ambulatory Visit: Payer: 59

## 2021-01-17 ENCOUNTER — Ambulatory Visit: Payer: 59 | Admitting: Oncology

## 2021-01-17 ENCOUNTER — Other Ambulatory Visit: Payer: 59

## 2021-01-17 DIAGNOSIS — A419 Sepsis, unspecified organism: Secondary | ICD-10-CM | POA: Diagnosis not present

## 2021-01-17 LAB — COMPREHENSIVE METABOLIC PANEL
ALT: 35 U/L (ref 0–44)
AST: 31 U/L (ref 15–41)
Albumin: 2.6 g/dL — ABNORMAL LOW (ref 3.5–5.0)
Alkaline Phosphatase: 66 U/L (ref 38–126)
Anion gap: 9 (ref 5–15)
BUN: 27 mg/dL — ABNORMAL HIGH (ref 6–20)
CO2: 24 mmol/L (ref 22–32)
Calcium: 8.3 mg/dL — ABNORMAL LOW (ref 8.9–10.3)
Chloride: 112 mmol/L — ABNORMAL HIGH (ref 98–111)
Creatinine, Ser: 1.66 mg/dL — ABNORMAL HIGH (ref 0.61–1.24)
GFR, Estimated: 48 mL/min — ABNORMAL LOW (ref >60)
Glucose, Bld: 117 mg/dL — ABNORMAL HIGH (ref 70–99)
Potassium: 2.8 mmol/L — ABNORMAL LOW (ref 3.5–5.1)
Sodium: 145 mmol/L (ref 135–145)
Total Bilirubin: 0.5 mg/dL (ref 0.3–1.2)
Total Protein: 5.5 g/dL — ABNORMAL LOW (ref 6.5–8.1)

## 2021-01-17 LAB — BASIC METABOLIC PANEL
Anion gap: 8 (ref 5–15)
BUN: 23 mg/dL — ABNORMAL HIGH (ref 6–20)
CO2: 25 mmol/L (ref 22–32)
Calcium: 8.1 mg/dL — ABNORMAL LOW (ref 8.9–10.3)
Chloride: 109 mmol/L (ref 98–111)
Creatinine, Ser: 1.23 mg/dL (ref 0.61–1.24)
GFR, Estimated: >60 mL/min (ref >60)
Glucose, Bld: 100 mg/dL — ABNORMAL HIGH (ref 70–99)
Potassium: 2.4 mmol/L — CL (ref 3.5–5.1)
Sodium: 142 mmol/L (ref 135–145)

## 2021-01-17 LAB — CBC WITH DIFFERENTIAL/PLATELET
Abs Immature Granulocytes: 0.04 10*3/uL (ref 0.00–0.07)
Basophils Absolute: 0 10*3/uL (ref 0.0–0.1)
Basophils Relative: 0 %
Eosinophils Absolute: 0 10*3/uL (ref 0.0–0.5)
Eosinophils Relative: 0 %
HCT: 30.9 % — ABNORMAL LOW (ref 39.0–52.0)
Hemoglobin: 10.4 g/dL — ABNORMAL LOW (ref 13.0–17.0)
Immature Granulocytes: 1 %
Lymphocytes Relative: 11 %
Lymphs Abs: 0.7 10*3/uL (ref 0.7–4.0)
MCH: 30.4 pg (ref 26.0–34.0)
MCHC: 33.7 g/dL (ref 30.0–36.0)
MCV: 90.4 fL (ref 80.0–100.0)
Monocytes Absolute: 0.4 10*3/uL (ref 0.1–1.0)
Monocytes Relative: 7 %
Neutro Abs: 5 10*3/uL (ref 1.7–7.7)
Neutrophils Relative %: 81 %
Platelets: 228 10*3/uL (ref 150–400)
RBC: 3.42 MIL/uL — ABNORMAL LOW (ref 4.22–5.81)
RDW: 12.7 % (ref 11.5–15.5)
WBC: 6.2 10*3/uL (ref 4.0–10.5)
nRBC: 0 % (ref 0.0–0.2)

## 2021-01-17 LAB — GLUCOSE, CAPILLARY
Glucose-Capillary: 109 mg/dL — ABNORMAL HIGH (ref 70–99)
Glucose-Capillary: 100 mg/dL — ABNORMAL HIGH (ref 70–99)
Glucose-Capillary: 89 mg/dL (ref 70–99)

## 2021-01-17 LAB — MAGNESIUM: Magnesium: 1.5 mg/dL — ABNORMAL LOW (ref 1.7–2.4)

## 2021-01-17 MED ORDER — MAGNESIUM OXIDE -MG SUPPLEMENT 400 (240 MG) MG PO TABS
400.0000 mg | ORAL_TABLET | Freq: Every day | ORAL | Status: DC
  Administered 2021-01-17 – 2021-01-26 (×10): 400 mg via ORAL
  Filled 2021-01-17 (×10): qty 1

## 2021-01-17 MED ORDER — MAGNESIUM SULFATE 2 GM/50ML IV SOLN
2.0000 g | Freq: Once | INTRAVENOUS | Status: AC
  Administered 2021-01-17: 2 g via INTRAVENOUS
  Filled 2021-01-17: qty 50

## 2021-01-17 MED ORDER — POTASSIUM CHLORIDE CRYS ER 20 MEQ PO TBCR
40.0000 meq | EXTENDED_RELEASE_TABLET | ORAL | Status: AC
  Administered 2021-01-17 (×3): 40 meq via ORAL
  Filled 2021-01-17 (×2): qty 2

## 2021-01-17 MED ORDER — POTASSIUM CHLORIDE 10 MEQ/100ML IV SOLN
10.0000 meq | INTRAVENOUS | Status: AC
  Administered 2021-01-17 (×4): 10 meq via INTRAVENOUS
  Filled 2021-01-17 (×4): qty 100

## 2021-01-17 NOTE — Evaluation (Signed)
Physical Therapy One Time Evaluation Patient Details Name: Sean [name] MRN: [id number] DOB: [birth year] Today's Date: 01/17/2021   History of Present Illness  Jaydn Moscato is a 55 y.o. male admitted from SNF for sepsis of unknown source however had recent admission for severe sepsis due to community-acquired pneumonia.  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 Pt currently with functional limitations due to the deficits listed below (see PT Problem List). Pt will benefit from skilled PT to increase their independence and safety with mobility to allow discharge to the venue listed below.  Pt with cognitive impairments at baseline and requiring assist for mobility due to cognition.  Pt not consistent with following multimodal cues and requiring repeated cues for task (leaving mitten alone, etc).  Therefore recommend 24/7 custodial care upon d/c as pt does not appear to have carryover for skilled therapy.     Follow Up Recommendations No PT follow up (recommend 24/7 for custodial care)    Equipment Recommendations  None recommended by PT    Recommendations for Other Services       Precautions / Restrictions Precautions Precautions: Fall Precaution Comments: cognitive deficits, mittens      Mobility  Bed Mobility Overal bed mobility: Needs Assistance Bed Mobility: Supine to Sit;Sit to Supine     Supine to sit: Mod assist Sit to supine: Mod assist   General bed mobility comments: multimodal cues however pt not assisting until therapist initiates movement with manual cues    Transfers Overall transfer level: Needs assistance Equipment used: None Transfers: Sit to/from Stand Sit to Stand: Mod assist         General transfer comment: variable assistance due to lack of following cues, pt also did not completely stand erect but was  taking weight through LEs  Ambulation/Gait                Stairs            Wheelchair Mobility    Modified Rankin (Stroke Patients Only)       Balance Overall balance assessment: Needs assistance Sitting-balance support: Feet supported;No upper extremity supported Sitting balance-Leahy Scale: Fair                                       Pertinent Vitals/Pain Faces Pain Scale: No hurt    Home Living Family/patient expects to be discharged to:: Skilled nursing facility                 Additional Comments: Patient an unreliable historian. PLOF unknown.    Prior Function Level of Independence: Needs assistance   Gait / Transfers Assistance Needed: unsure  functional level since in rehab. Patient unable to state.  Pt was able to ambulate with increased assist during previous admission  ADL's / Homemaking Assistance Needed: total A required, incontinent        Hand Dominance        Extremity/Trunk Assessment        Lower Extremity Assessment Lower Extremity Assessment: Generalized weakness (pt moving extremities in bed)    Cervical / Trunk Assessment Cervical / Trunk Assessment: Normal  Communication   Communication: No difficulties  Cognition Arousal/Alertness: Awake/alert Behavior During Therapy: WFL for tasks assessed/performed Overall Cognitive Status: History of cognitive impairments - at baseline  Area of Impairment: Orientation;Memory;Following commands;Safety/judgement;Awareness;Problem solving                 Orientation Level: Disoriented to;Place;Time;Situation   Memory: Decreased short-term memory Following Commands: Follows one step commands inconsistently Safety/Judgement: Decreased awareness of safety;Decreased awareness of deficits   Problem Solving: Slow processing;Decreased initiation;Difficulty sequencing;Requires verbal cues;Requires tactile cues General Comments: needs multimodal cues and  redirection to tasks, patient at times does not follow multimodal cues, sometimes assist with manual cues      General Comments      Exercises     Assessment/Plan    PT Assessment Patent does not need any further PT services  PT Problem List Decreased strength;Decreased mobility;Decreased safety awareness;Decreased activity tolerance;Decreased cognition;Decreased balance       PT Treatment Interventions      PT Goals (Current goals can be found in the Care Plan section)  Acute Rehab PT Goals PT Goal Formulation: All assessment and education complete, DC therapy    Frequency     Barriers to discharge        Co-evaluation               AM-PAC PT "6 Clicks" Mobility  Outcome Measure Help needed turning from your back to your side while in a flat bed without using bedrails?: Total Help needed moving from lying on your back to sitting on the side of a flat bed without using bedrails?: Total Help needed moving to and from a bed to a chair (including a wheelchair)?: Total Help needed standing up from a chair using your arms (e.g., wheelchair or bedside chair)?: Total Help needed to walk in hospital room?: Total Help needed climbing 3-5 steps with a railing? : Total 6 Click Score: 6    End of Session Equipment Utilized During Treatment: Gait belt Activity Tolerance: Patient tolerated treatment well Patient left: in bed;with bed alarm set;with call bell/phone within reach;with nursing/sitter in room (bil mittens in place) Nurse Communication: Mobility status PT Visit Diagnosis: Unsteadiness on feet (R26.81);Difficulty in walking, not elsewhere classified (R26.2)    Time: 1410-1434 PT Time Calculation (min) (ACUTE ONLY): 24 min   Charges:   PT Evaluation $PT Eval Moderate Complexity: 1 Mod     Kati PT, DPT Acute Rehabilitation Services Pager: [phone] Office: [place] 01/17/2021, 3:00 PM

## 2021-01-17 NOTE — Progress Notes (Signed)
Notified house supervisor of pts 1:1 order. No sitters available at this time. Pt continues to attempt to get out of bed. Alarms set. CRN notified.

## 2021-01-17 NOTE — TOC Progression Note (Signed)
Transition of Care [facility]) - Progression Note    Patient Details  Name: Sean [name] MRN: [id number] Date of Birth: March 29, 1966  Transition of Care [facility]) CM/SW [place], [place] Phone Number: 01/17/2021, 11:58 AM  Clinical Narrative:   Spoke with Antoinette at [facility] who confirms that they need re-authorization for short term rehab by insurance.  Called wife to confirm plan.  She has further medical questions.  I messaged MD asking him for PT order and for him to follow up with wife. TOC will continue to follow during the course of hospitalization.     Expected Discharge Plan: [place] Barriers to Discharge: Continued Medical Work up  Expected Discharge Plan and Services Expected Discharge Plan: [place]   Discharge Planning Services: CM Consult   Living arrangements for the past 2 months: [place]                                       Social Determinants of Health (SDOH) Interventions    Readmission Risk Interventions No flowsheet data found.

## 2021-01-17 NOTE — Progress Notes (Addendum)
PROGRESS NOTE    Sean [name]  PFX:[id number] DOB: [birth year] DOA: 01/13/2021 PCP: [name], MD    Chief Complaint  Patient presents with  . 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 Patient's wife states that he was at his baseline mental status 1 day PTA, when she checked on him at the facility. Last few days patient has been having persistent nausea, vomiting unable to keep his medications. Has been doing poorly last few days. 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 In the ER patient was hypotensive, tachycardic and febrile with temperature of 103 F, chest x-ray unremarkable UA unremarkable CT head shows hemorrhagic metastasis. COVID test is negative. Labs show potassium of 2.7, creatinine of 2.7, WBC of 17, lactic acid to be normal. Patient was empirically started on antibiotics with unknown source. Admitted for further work-up of sepsis.   Assessment & Plan:   Principal Problem:   Sepsis ([place]) Active Problems:   Brain metastases ([place])   Seizure ([place])   Hypokalemia   Acute tubulointerstitial nephritis  Sepsis of unknown etiology On admission was febrile, tachycardic, tachypneic, with leukocytosis CXR 4/30 with low lung volumes UA bland, urine cx no growth Blood cx NGTD Procalcitonin 14.88-->5.96 --> 2.22 Continue IV cefepime (pharmacy to renally dose), plan to complete 5 days of cefepime and flagyl given immunocompromised state, discontinue vancomycin with negative MRSA PCR S/P aggressive IV fluids (received about 6-7 L of IVF, BP stable) Monitor closely  Acute metabolic  encephalopathy Underlying cognitive impairment Currently at baseline per oncology note from 5/3 Likely 2/2 above CT head showed no significant interval change in size and appearance of multiple hemorrhagic metastasis scattered throughout both cerebral hemispheres with similar localized vasogenic edema  ??Possible secondary adrenal insufficiency ??Steroid withdrawal Patient was unable to keep any medications down, including steriods for the past few days PTA Taper stress dose steroids, then resume  AKI History of tubulointerstitial nephritis Noted significant dehydration on admission <1, developed tubulointerstitial nephritis and was discharged with creatinine of 1.8 on steroids  Creatinine bumped to 2.72 on presentation Peaked at 3.14 Improving Low threshold for renal Continue steroids as above  Hypotension BP now stable s/p aggressive IV fluids, stress dose steroids Multifactorial, sepsis, dehydration, possible steroid withdrawal Continue stress dose steroids, tapering  Hypokalemia  Hypomagnesemia Replace as needed  Anemia of chronic disease Hemoglobin baseline around 10-11 Noted hemoglobin drop, likely hemodilution from aggressive IV fluids No evidence of any bleeding Daily CBC  Metastatic melanoma with extensive mets including brain S/p craniotomy and resection of left occipital and left frontal lesion in 1/22 Follows Dr. 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   Weight as of this encounter: 77.1 kg.   DVT prophylaxis: SCD Code Status: DNR Family Communication: none at bedside - wife over phone Disposition:   Status is: Inpatient  Remains inpatient appropriate because:Inpatient level of care appropriate due to severity of  illness  Dispo: The patient is from: Home              Anticipated d/c is to: pending              Patient currently is not medically stable to d/c.   Difficult to place patient Yes       Consultants:   oncology  Procedures:  none  Antimicrobials:  Anti-infectives (From admission, onward)   Start     Dose/Rate Route Frequency Ordered Stop   01/16/21 1200  ceFEPIme (MAXIPIME) 2 g in sodium chloride 0.9 % 100 mL IVPB        2 g 200 mL/hr over 30 Minutes Intravenous Every 12 hours 01/16/21 1009     01/15/21 2200  ceFEPIme (MAXIPIME) 2 g in sodium chloride 0.9 % 100 mL IVPB  Status:  Discontinued        2 g 200 mL/hr over 30 Minutes Intravenous Every 24 hours 01/15/21 0725 01/16/21 1009   01/15/21 1000  vancomycin (VANCOREADY) IVPB 1250 mg/250 mL  Status:  Discontinued        1,250 mg 166.7 mL/hr over 90 Minutes Intravenous Every 36 hours 01/14/21 0404 01/16/21 0714   01/14/21 1000  ceFEPIme (MAXIPIME) 2 g in sodium chloride 0.9 % 100 mL IVPB  Status:  Discontinued        2 g 200 mL/hr over 30 Minutes Intravenous Every 12 hours 01/14/21 0404 01/15/21 0725   01/14/21 0600  metroNIDAZOLE (FLAGYL) IVPB 500 mg        500 mg 100 mL/hr over 60 Minutes Intravenous Every 8 hours 01/14/21 0404     01/13/21 2200  ceFEPIme (MAXIPIME) 2 g in sodium chloride 0.9 % 100 mL IVPB        2 g 200 mL/hr over 30 Minutes Intravenous  Once 01/13/21 2154 01/13/21 2322   01/13/21 2200  metroNIDAZOLE (FLAGYL) IVPB 500 mg        500 mg 100 mL/hr over 60 Minutes Intravenous  Once 01/13/21 2154 01/13/21 2322   01/13/21 2200  vancomycin (VANCOCIN) IVPB 1000 mg/200 mL premix  Status:  Discontinued        1,000 mg 200 mL/hr over 60 Minutes Intravenous  Once 01/13/21 2154 01/13/21 2155   01/13/21 2200  vancomycin (VANCOREADY) IVPB 1500 mg/300 mL        1,500 mg 150 mL/hr over 120 Minutes Intravenous  Once 01/13/21 2155 01/14/21 0122         Subjective: No new complaints Alert, but confused.   Miri [email or web address]&OX1  Objective: Vitals:   01/16/21 2357 01/17/21 0220 01/17/21 0616 01/17/21 1354  BP: 136/80 (!) 123/93 (!) 117/94 (!) 144/81  Pulse: 68 70 65 62  Resp: 16 16 16 16   Temp: 98.3 F (36.8 C) 98.7 F (37.1 C) 97.7 F (36.5 C) 97.7 F (36.5 C)  TempSrc: Oral Oral Oral Oral  SpO2: 98% 98% 100% 98%  Weight:      Height:        Intake/Output Summary (Last 24 hours) at 01/17/2021 1909 Last data filed at 01/17/2021 1500 Gross per 24 hour  Intake 550 ml  Output 850 ml  Net -300 ml   Filed Weights   01/13/21 2212  Weight: 77.1 kg    Examination:  General exam: Appears calm and comfortable  Respiratory system: Clear to auscultation. Respiratory effort normal. Cardiovascular system: S1 & S2 heard, RRR.  Gastrointestinal system: Abdomen is nondistended, soft and nontender.  Central nervous system: Alert and oriented x1.  Moving all extremities Extremities: no LEE    Data Reviewed: I have personally reviewed following labs and imaging studies  CBC: Recent Labs  Lab 01/13/21 2154 01/14/21 0426 01/15/21 0256 01/16/21 0247 01/17/21 0450  WBC 17.0* 6.9 4.6 5.6 6.2  NEUTROABS 15.1* 5.8 4.1 4.8 5.0  HGB 13.0 9.6* 8.6* 9.4* 10.4*  HCT 36.6* 27.8* 25.9* 28.6* 30.9*  MCV 86.7 89.4 91.2 91.1 90.4  PLT 267 139* 137* 188 [id number]    Basic Metabolic Panel: Recent Labs  Lab 01/13/21 2154 01/14/21 0426 01/15/21 0256 01/16/21 0247 01/17/21 0450  NA 133* 134* 140 141 145  K 2.7* 3.4* 3.5 3.1* 2.8*  CL 97* 101 109 110 112*  CO2 22 23 22 22 24   GLUCOSE 148* 115* 113* 117* 117*  BUN 26* 25* 34* 35* 27*  CREATININE 2.72* 2.71* 3.14* 2.60* 1.66*  CALCIUM 8.7* 7.4* 8.0* 8.2* 8.3*  MG  --   --   --   --  1.5*    GFR: Estimated Creatinine Clearance: 54.2 mL/min (Dnya Hickle) (by C-G formula based on SCr of 1.66 mg/dL (H)).  Liver Function Tests: Recent Labs  Lab 01/13/21 2154 01/14/21 0426 01/15/21 0256 01/16/21 0247 01/17/21 0450  AST 35 56* 53* 36 31  ALT 40 34 37 37 35  ALKPHOS  101 69 63 65 66  BILITOT 1.8* 1.2 0.8 1.0 0.5  PROT 6.6 4.4* 4.7* 5.1* 5.5*  ALBUMIN 3.2* 2.2* 2.3* 2.5* 2.6*    CBG: Recent Labs  Lab 01/16/21 1823 01/16/21 2358 01/17/21 0617 01/17/21 1204 01/17/21 1655  GLUCAP 97 116* 109* 100* 89     Recent Results (from the past 240 hour(s))  Resp Panel by RT-PCR (Flu Ciara Kagan&B, Covid) Nasopharyngeal Swab     Status: None   Collection Time: 01/13/21  9:54 PM   Specimen: Nasopharyngeal Swab; Nasopharyngeal(NP) swabs in vial transport medium  Result Value Ref Range Status   SARS Coronavirus 2 by RT PCR NEGATIVE NEGATIVE Final    Comment: (NOTE) SARS-CoV-2 target nucleic acids are NOT DETECTED.  The SARS-CoV-2 RNA is generally detectable in upper respiratory specimens during the acute phase of infection. The lowest concentration of SARS-CoV-2 viral copies this assay can detect is 138 copies/mL. Yaniris Braddock negative result does not preclude SARS-Cov-2 infection and should not be used as the sole basis for treatment or other patient management decisions. Dezmon Conover negative result may occur with  improper specimen collection/handling, submission of specimen other than nasopharyngeal swab, presence of viral mutation(s) within the areas targeted by this assay, and inadequate number of viral copies(<138 copies/mL). Arad Burston negative result must be combined with clinical observations, patient history, and epidemiological information. The expected result is Negative.  Fact Sheet for Patients:  EntrepreneurPulse.com.au  Fact Sheet for Healthcare Providers:  IncredibleEmployment.be  This test is no t yet approved or cleared by the [place] FDA and  has been authorized for detection and/or diagnosis of SARS-CoV-2 by FDA under an Emergency Use Authorization (EUA). This EUA will remain  in effect (meaning this test can be used) for the duration of the COVID-19 declaration under Section 564(b)(1) of the Act, 21 U.S.C.section  360bbb-3(b)(1), unless the authorization is terminated  or revoked sooner.       Influenza Naveed Humphres by PCR NEGATIVE NEGATIVE Final   Influenza B by PCR NEGATIVE NEGATIVE Final    Comment: (NOTE) The Xpert Xpress SARS-CoV-2/FLU/RSV plus assay is intended as an aid in the diagnosis of influenza from Nasopharyngeal swab specimens and should not be used as  Kei Mcelhiney sole basis for treatment. Nasal washings and aspirates are unacceptable for Xpert Xpress SARS-CoV-2/FLU/RSV testing.  Fact Sheet for Patients: EntrepreneurPulse.com.au  Fact Sheet for Healthcare Providers: IncredibleEmployment.be  This test is not yet approved or cleared by the [place] FDA and has been authorized for detection and/or diagnosis of SARS-CoV-2 by FDA under an Emergency Use Authorization (EUA). This EUA will remain in effect (meaning this test can be used) for the duration of the COVID-19 declaration under Section 564(b)(1) of the Act, 21 U.S.C. section 360bbb-3(b)(1), unless the authorization is terminated or revoked.  Performed at [facility], [place] [address] [place], [place] [address]   Blood Culture (routine x 2)     Status: None (Preliminary result)   Collection Time: 01/13/21  9:54 PM   Specimen: BLOOD  Result Value Ref Range Status   Specimen Description   Final    BLOOD RIGHT ANTECUBITAL Performed at [facility] [address], [place], [place] [address]    Special Requests   Final    BOTTLES DRAWN AEROBIC ONLY Blood Culture results may not be optimal due to an inadequate volume of blood received in culture bottles Performed at [facility] [address] [place], [place] [address]    Culture   Final    NO GROWTH 3 DAYS Performed at [facility], [place] [address] [place], [place] [address]    Report Status PENDING  Incomplete  Urine culture     Status: None   Collection Time: 01/13/21  9:54 PM    Specimen: In/Out Cath Urine  Result Value Ref Range Status   Specimen Description   Final    IN/OUT CATH URINE Performed at [facility] [address], [place], [place] [address]    Special Requests   Final    NONE Performed at [facility], [place] [address] [place], [place] [address]    Culture   Final    NO GROWTH Performed at [facility], [place] [address] [place], [place] [address]    Report Status 01/15/2021 FINAL  Final  Blood Culture (routine x 2)     Status: None (Preliminary result)   Collection Time: 01/13/21  9:59 PM   Specimen: BLOOD  Result Value Ref Range Status   Specimen Description   Final    BLOOD LEFT ANTECUBITAL Performed at [facility] [address] [place], [place] [address]    Special Requests   Final    BOTTLES DRAWN AEROBIC AND ANAEROBIC Blood Culture adequate volume Performed at [facility] [address] [place], [place] [address]    Culture   Final    NO GROWTH 3 DAYS Performed at [facility], [place] [address] [place], [place] [address]    Report Status PENDING  Incomplete  MRSA PCR Screening     Status: None   Collection Time: 01/15/21 11:45 AM   Specimen: Nasopharyngeal  Result Value Ref Range Status   MRSA by PCR NEGATIVE NEGATIVE Final    Comment:        The GeneXpert MRSA Assay (FDA approved for NASAL specimens only), is one component of Timber Lucarelli comprehensive MRSA colonization surveillance program. It is not intended to diagnose MRSA infection nor to guide or monitor treatment for MRSA infections. Performed at [facility], [place] [address], [place], [place] [address]   C Difficile Quick Screen w PCR reflex  Status: None   Collection Time: 01/16/21 10:42 AM   Specimen: STOOL  Result Value Ref Range Status   C Diff antigen NEGATIVE NEGATIVE Final   C Diff toxin NEGATIVE NEGATIVE Final   C Diff interpretation No C. difficile  detected.  Final    Comment: Performed at  [facility] [place] [address], [place], [place] [address]         Radiology Studies: No results found.      Scheduled Meds: . Chlorhexidine Gluconate Cloth  6 each Topical Daily  . citalopram  10 mg Oral QHS  . hydrocortisone sod succinate (SOLU-CORTEF) inj  50 mg Intravenous Q12H  . magnesium oxide  400 mg Oral Daily  . mouth rinse  15 mL Mouth Rinse BID  . pantoprazole  40 mg Oral Daily  . senna-docusate  1 tablet Oral QHS   Continuous Infusions: . ceFEPime (MAXIPIME) IV 2 g (01/17/21 1130)  . levETIRAcetam 500 mg (01/17/21 1655)  . metronidazole 500 mg (01/17/21 1446)     LOS: 3 days    Time spent: over 30 min    Fayrene Helper, MD [facility]   To contact the attending provider between 7A-7P or the covering provider during after hours 7P-7A, please log into the web site [email or web address] and access using universal [place] password for that web site. If you do not have the password, please call the hospital operator.  01/17/2021, 7:09 PM

## 2021-01-18 DIAGNOSIS — C7931 Secondary malignant neoplasm of brain: Secondary | ICD-10-CM | POA: Diagnosis not present

## 2021-01-18 DIAGNOSIS — A419 Sepsis, unspecified organism: Secondary | ICD-10-CM | POA: Diagnosis not present

## 2021-01-18 LAB — CBC WITH DIFFERENTIAL/PLATELET
Abs Immature Granulocytes: 0.03 10*3/uL (ref 0.00–0.07)
Basophils Absolute: 0 10*3/uL (ref 0.0–0.1)
Basophils Relative: 0 %
Eosinophils Absolute: 0 10*3/uL (ref 0.0–0.5)
Eosinophils Relative: 0 %
HCT: 32.8 % — ABNORMAL LOW (ref 39.0–52.0)
Hemoglobin: 11.2 g/dL — ABNORMAL LOW (ref 13.0–17.0)
Immature Granulocytes: 1 %
Lymphocytes Relative: 16 %
Lymphs Abs: 0.9 10*3/uL (ref 0.7–4.0)
MCH: 30.4 pg (ref 26.0–34.0)
MCHC: 34.1 g/dL (ref 30.0–36.0)
MCV: 89.1 fL (ref 80.0–100.0)
Monocytes Absolute: 0.5 10*3/uL (ref 0.1–1.0)
Monocytes Relative: 8 %
Neutro Abs: 4.2 10*3/uL (ref 1.7–7.7)
Neutrophils Relative %: 75 %
Platelets: 241 10*3/uL (ref 150–400)
RBC: 3.68 MIL/uL — ABNORMAL LOW (ref 4.22–5.81)
RDW: 12.6 % (ref 11.5–15.5)
WBC: 5.7 10*3/uL (ref 4.0–10.5)
nRBC: 0 % (ref 0.0–0.2)

## 2021-01-18 LAB — COMPREHENSIVE METABOLIC PANEL
ALT: 40 U/L (ref 0–44)
AST: 36 U/L (ref 15–41)
Albumin: 2.7 g/dL — ABNORMAL LOW (ref 3.5–5.0)
Alkaline Phosphatase: 67 U/L (ref 38–126)
Anion gap: 9 (ref 5–15)
BUN: 20 mg/dL (ref 6–20)
CO2: 28 mmol/L (ref 22–32)
Calcium: 8.4 mg/dL — ABNORMAL LOW (ref 8.9–10.3)
Chloride: 110 mmol/L (ref 98–111)
Creatinine, Ser: 1.07 mg/dL (ref 0.61–1.24)
GFR, Estimated: >60 mL/min (ref >60)
Glucose, Bld: 104 mg/dL — ABNORMAL HIGH (ref 70–99)
Potassium: 3.1 mmol/L — ABNORMAL LOW (ref 3.5–5.1)
Sodium: 147 mmol/L — ABNORMAL HIGH (ref 135–145)
Total Bilirubin: 0.8 mg/dL (ref 0.3–1.2)
Total Protein: 5.4 g/dL — ABNORMAL LOW (ref 6.5–8.1)

## 2021-01-18 LAB — PHOSPHORUS: Phosphorus: 3.2 mg/dL (ref 2.5–4.6)

## 2021-01-18 LAB — BASIC METABOLIC PANEL
Anion gap: 11 (ref 5–15)
BUN: 17 mg/dL (ref 6–20)
CO2: 22 mmol/L (ref 22–32)
Calcium: 8 mg/dL — ABNORMAL LOW (ref 8.9–10.3)
Chloride: 109 mmol/L (ref 98–111)
Creatinine, Ser: 0.89 mg/dL (ref 0.61–1.24)
GFR, Estimated: >60 mL/min (ref >60)
Glucose, Bld: 96 mg/dL (ref 70–99)
Potassium: 2.7 mmol/L — CL (ref 3.5–5.1)
Sodium: 142 mmol/L (ref 135–145)

## 2021-01-18 LAB — GLUCOSE, CAPILLARY
Glucose-Capillary: 101 mg/dL — ABNORMAL HIGH (ref 70–99)
Glucose-Capillary: 106 mg/dL — ABNORMAL HIGH (ref 70–99)
Glucose-Capillary: 106 mg/dL — ABNORMAL HIGH (ref 70–99)
Glucose-Capillary: 93 mg/dL (ref 70–99)
Glucose-Capillary: 95 mg/dL (ref 70–99)

## 2021-01-18 LAB — MAGNESIUM: Magnesium: 1.6 mg/dL — ABNORMAL LOW (ref 1.7–2.4)

## 2021-01-18 LAB — PROCALCITONIN: Procalcitonin: 0.14 ng/mL

## 2021-01-18 MED ORDER — DEXTROSE 5 % IV SOLN: INTRAVENOUS | Status: AC

## 2021-01-18 MED ORDER — MAGNESIUM SULFATE 2 GM/50ML IV SOLN
2.0000 g | Freq: Once | INTRAVENOUS | Status: AC
  Administered 2021-01-18: 2 g via INTRAVENOUS
  Filled 2021-01-18: qty 50

## 2021-01-18 MED ORDER — PREDNISONE 20 MG PO TABS
40.0000 mg | ORAL_TABLET | Freq: Every day | ORAL | Status: DC
  Administered 2021-01-19: 40 mg via ORAL
  Filled 2021-01-18: qty 2

## 2021-01-18 MED ORDER — POTASSIUM CHLORIDE CRYS ER 20 MEQ PO TBCR
40.0000 meq | EXTENDED_RELEASE_TABLET | ORAL | Status: AC
  Administered 2021-01-18 (×3): 40 meq via ORAL
  Filled 2021-01-18 (×3): qty 2

## 2021-01-18 MED ORDER — SODIUM CHLORIDE 0.45 % IV SOLN
INTRAVENOUS | Status: DC
  Filled 2021-01-18 (×2): qty 1000

## 2021-01-18 MED ORDER — POTASSIUM CHLORIDE 2 MEQ/ML IV SOLN: INTRAVENOUS | Status: DC

## 2021-01-18 MED ORDER — POTASSIUM CHLORIDE 10 MEQ/100ML IV SOLN
10.0000 meq | INTRAVENOUS | Status: AC
  Administered 2021-01-19 (×4): 10 meq via INTRAVENOUS
  Filled 2021-01-18 (×4): qty 100

## 2021-01-18 NOTE — Progress Notes (Addendum)
HEMATOLOGY-ONCOLOGY PROGRESS NOTE  SUBJECTIVE: The patient is sitting up in the recliner.  Remains pleasantly confused.  Sitter at the bedside.  Oncology History  Melanoma of skin ([place])  10/05/2020 Initial Diagnosis   Melanoma of skin ([place])   10/05/2020 Cancer Staging   Staging form: Melanoma of the Skin, AJCC 8th Edition - Pathologic: Stage IV (pTX, pNX, pM1) - Signed by [name], MD on 10/05/2020   10/16/2020 -  Chemotherapy    Patient is on Treatment Plan: MELANOMA NIVOLUMAB + IPILIMUMAB (1/3) Q21D / NIVOLUMAB D32I       PHYSICAL EXAMINATION:  Vitals:   01/17/21 2006 01/18/21 0419  BP: (!) 142/79 130/84  Pulse: 60 66  Resp: 15 14  Temp: 98 F (36.7 C) 98 F (36.7 C)  SpO2: 100% 99%   Filed Weights   01/13/21 2212  Weight: 77.1 kg    Intake/Output from previous day: 05/04 0701 - 05/05 0700 In: 400 [IV Piggyback:400] Out: 2850 [Urine:2850]  SKIN: skin color, texture, turgor are normal, no rashes or significant lesions EYES: normal, Conjunctiva are pink and non-injected, sclera clear OROPHARYNX:no exudate, no erythema and lips, buccal mucosa, and tongue normal  LUNGS: clear to auscultation and percussion with normal breathing effort HEART: regular rate & rhythm and no murmurs and no lower extremity edema ABDOMEN:abdomen soft, non-tender and normal bowel sounds NEURO: Alert, confused, follows commands, moves all extremities to command  LABORATORY DATA:  I have reviewed the data as listed CMP Latest Ref Rng & Units 01/18/2021 01/17/2021 01/17/2021  Glucose 70 - 99 mg/dL 104(H) 100(H) 117(H)  BUN 6 - 20 mg/dL 20 23(H) 27(H)  Creatinine 0.61 - 1.24 mg/dL 1.07 1.23 1.66(H)  Sodium 135 - 145 mmol/L 147(H) 142 145  Potassium 3.5 - 5.1 mmol/L 3.1(L) 2.4(LL) 2.8(L)  Chloride 98 - 111 mmol/L 110 109 112(H)  CO2 22 - 32 mmol/L 28 25 24   Calcium 8.9 - 10.3 mg/dL 8.4(L) 8.1(L) 8.3(L)  Total Protein 6.5 - 8.1 g/dL 5.4(L) - 5.5(L)  Total Bilirubin 0.3 - 1.2 mg/dL 0.8 - 0.5   Alkaline Phos 38 - 126 U/L 67 - 66  AST 15 - 41 U/L 36 - 31  ALT 0 - 44 U/L 40 - 35    Lab Results  Component Value Date   WBC 5.7 01/18/2021   HGB 11.2 (L) 01/18/2021   HCT 32.8 (L) 01/18/2021   MCV 89.1 01/18/2021   PLT 241 01/18/2021   NEUTROABS 4.2 01/18/2021    CT Head Wo Contrast  Result Date: 01/13/2021 CLINICAL DATA:  Initial evaluation for acute delirium. EXAM: CT HEAD WITHOUT CONTRAST TECHNIQUE: Contiguous axial images were obtained from the base of the skull through the vertex without intravenous contrast. COMPARISON:  Recent CT from 12/18/2020. FINDINGS: Brain: Again seen are multiple hemorrhagic metastases scattered throughout both cerebral hemispheres. Overall, lesions are not appreciably changed or progressed as compared to most recent CT. Most prominent of these lesions positioned at the left parietal lobe in measures approximately 1.8 cm. Localized vasogenic edema about the preponderance of these lesions without midline shift or significant regional mass effect. No evidence for new/interval hemorrhage by CT. No appreciable infratentorial involvement. No acute large vessel territory infarct. Ventricles stable in size without hydrocephalus. No extra-axial fluid collection. Vascular: No hyperdense vessel. Skull: Scalp soft tissues demonstrate no acute finding. Post craniotomy [name] is noted at the left calvarium. Sinuses/Orbits: Globes and orbital soft tissues demonstrate no acute finding. Visualized paranasal sinuses are clear. No mastoid effusion. Other:  None. IMPRESSION: 1. No significant interval change in size and appearance of multiple hemorrhagic metastases scattered throughout both cerebral hemispheres, with similar localized vasogenic edema. No midline shift or significant regional mass effect. 2. No other acute intracranial abnormality. Electronically Signed   By: Jeannine Boga M.D.   On: 01/13/2021 23:16   US RENAL  Result Date: 12/26/2020 CLINICAL DATA:   55 year old male with acute renal insufficiency. Metastatic melanoma. EXAM: RENAL / URINARY TRACT ULTRASOUND COMPLETE COMPARISON:  CT Abdomen and Pelvis 09/14/2020. FINDINGS: Right Kidney: Renal measurements: 13.3 x 6.7 x 8.8 cm = volume: 406 mL. Echogenicity within normal limits. No mass or hydronephrosis visualized. Left Kidney: Renal measurements: 13.5 x 7.3 by 6.9 cm = volume: 355 mL. Echogenicity within normal limits. No mass or hydronephrosis visualized. Bladder: Diminutive.  Appears normal for degree of bladder distention. Other: None. IMPRESSION: Normal ultrasound appearance of both kidneys and the urinary bladder. Electronically Signed   By: Genevie Ann M.D.   On: 12/26/2020 08:17   DG Chest Port 1 View  Result Date: 01/13/2021 CLINICAL DATA:  Unresponsive EXAM: PORTABLE CHEST 1 VIEW COMPARISON:  12/18/2020 FINDINGS: The heart size and mediastinal contours are within normal limits. Both lungs are clear. The visualized skeletal structures are unremarkable. The lung volumes are low. These low lung volumes likely accentuate the upper mediastinum. IMPRESSION: Low lung volumes.  Otherwise, stable exam. Electronically Signed   By: Constance Holster M.D.   On: 01/13/2021 23:05   US BIOPSY (KIDNEY)  Result Date: 12/26/2020 INDICATION: 55 year old male with a history of melanoma metastatic to the brain and now acute kidney injury. Concern for drug related nephritis. He presents for random renal biopsy. EXAM: ULTRASOUND GUIDED RENAL BIOPSY COMPARISON:  None. MEDICATIONS: Fentanyl 50 mcg IV; Versed 4 mg IV ANESTHESIA/SEDATION: Total Moderate Sedation time 10 minutes The patient's vital signs and level of consciousness were monitored continuously by radiology nursing under my direct supervision. COMPLICATIONS: None immediate PROCEDURE: Informed written consent was obtained from the patient after a discussion of the risks, benefits and alternatives to treatment. The patient understands and consents the procedure. A  timeout was performed prior to the initiation of the procedure. Ultrasound scanning was performed of the bilateral flanks. The inferior pole of the right kidney was selected for biopsy due to location and sonographic window. The procedure was planned. The operative site was prepped and draped in the usual sterile fashion. The overlying soft tissues were anesthetized with 1% lidocaine with epinephrine. A 17 gauge core needle biopsy device was advanced into the inferior cortex of the right kidney and 2 core biopsies were obtained under direct ultrasound guidance. Images were saved for documentation purposes. The biopsy device was removed and hemostasis was obtained with manual compression. Post procedural scanning was negative for significant post procedural hemorrhage or additional complication. A dressing was placed. The patient tolerated the procedure well without immediate post procedural complication. IMPRESSION: Technically successful ultrasound guided right renal biopsy. Electronically Signed   By: Jacqulynn Cadet M.D.   On: 12/26/2020 14:14    ASSESSMENT AND PLAN: 1. 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, unknown primary tumor site  CT brain 09/14/2020-multiple hemorrhagic metastases with surrounding edema in the bilateral cerebral hemispheres  CTs chest, abdomen, pelvis 09/14/2020-1.4 cm right upper lobe subpleural nodule, 0.5 cm right lower lobe nodule, multiple hypodensities in the liver suspicious for metastases, 1.5 cm right upper pole hypodense kidney lesion-indeterminate  MRI abdomen 09/15/2020-multifocal T1 hyperintense liver lesions, no kidney mass  Ultrasound-guided biopsy  of a segment 4B liver lesion 09/18/2020-benign liver parenchyma with steatosis, no evidence of malignancy  PET 09/25/2020-focal area of hypermetabolism in the right hilum, multiple areas of the liver, pelvic musculature, and left thigh-no CT correlate with any of these areas on the  noncontrast CT, 10 mm anterior right upper lobe nodule with low-level hypermetabolism, additional tiny pulmonary nodules too small to characterize by PET  SRS to 10 brain lesions 09/27/2020  Resection of left occipital and left frontal lesions on 09/29/2020-pathology consistent with malignant melanoma  Cycle 1 ipilimumab/nivolumab 10/16/2020  CT of the brain without contrast 10/26/2020 showed significant interval increase in the number and size of numerous metastases  Brain MRI 10/24/2020-multiple new and increased brain metastases, moderate edema surrounding multiple lesions, new mass-effect at the right lateral ventricle  Brain MRI 11/01/2020-stable appearance of his multiple intracranial metastases and associated edema and mild mass-effect.  CT of the chest with contrast 11/01/2020-interval progression of bilateral pulmonary nodules concerning for metastatic disease, small right hilar lymph nodes, new and progressive ill-defined lesions in the liver compatible with progression of metastatic involvement.  Cycle 2 nivolumab 11/10/2020  Cycle 3 nivolumab 11/27/2020  MRI brain 12/13/2020-decrease in size of hemorrhagic brain metastases, improvement in vasogenic edema and mass-effect, new areas of nonhemorrhagic enhancement by frontal and right parietal white matter-nonspecific  CT chest 12/08/2020-decreased size of lung and liver metastases 2.Left-sided weakness, new onset seizure secondary to #1, weakness has resolved 3.Hyperlipidemia 4.Elevated liver enzymes and bilirubin  Liver enzymes persistently elevated 5.Headache and nausea/vomiting secondary to #1, resolved 6. Hospital admission 10/20/2020-encephalopathy, slow Decadron taper-down to 2 mg at discharge 11/09/2020 7.  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 [facility] admission 01/13/2021- sepsis of unclear source, AKI 9.  Renal failure beginning 12/18/2020- renal biopsy  12/26/2020-moderate acute tubulointerstitial nephritis, consistent with hypersensitivity caused by a PD-1 inhibitor, improved with prednisone, increased creatinine on admission 01/13/2021-sepsis related  Sean [name] appears appears unchanged.  He remains confused.  His hemoglobin continues to improve and his creatinine has normalized.  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  He is receiving Solu-Cortef.  Recommend changing him over to his home dose of prednisone.  Recommendations: 1.  Continue seizure medication. 2.  Continue steroids, would resume outpatient prednisone given for nephritis, dosing per nephrology 3.  We will continue to hold nivolumab at this time.  We will consider rechallenge him with nivolumab as an outpatient.  If the patient/family opts to not pursue additional treatment, prognosis is likely several months without treatment. 4.  Monitor hemoglobin   LOS: 4 days   Mikey Bussing, DNP, AGPCNP-BC, AOCNP 01/18/21 Mr. Joshi was interviewed and examined.  He appears unchanged.  The renal failure has improved.  His creatinine is returning to baseline.  There is no evidence of ongoing sepsis and cultures remain negative.  He is stable for discharge from an oncology standpoint.  I discussed the case with his court appointed legal guardian, Dorise Hiss, yesterday and again today.  There is a family meeting later today to decide on a permanent legal guardian and to have family discussions regarding disposition.  We discussed the likelihood that his mental status will not return to baseline.  Treatment options include continuation of nivolumab, we can resume nivolumab with the improved renal function, versus hospice care.  Dorise Hiss will contact us with the family decision.  I am glad to discuss the case further with his wife.  I was present for greater  than 50% of today's visit.  I performed medical decision making.

## 2021-01-18 NOTE — Progress Notes (Signed)
Received patient attempting to climb out of his bed, restless and very difficult to redirect. Mitten in place to BUE due to patient's attempts to remove  Peripheral IV. Patient remains confused. He was able to tolerate his medications well and slept from 11pm to 4am.

## 2021-01-18 NOTE — Progress Notes (Signed)
PROGRESS NOTE    Sean [name]  [id number] DOB: 26-May-1966 DOA: 01/13/2021 PCP: [name], MD    Chief Complaint  Patient presents with  . 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 Patient's wife states that he was at his baseline mental status 1 day PTA, when she checked on him at the facility. Last few days patient has been having persistent nausea, vomiting unable to keep his medications. Has been doing poorly last few days. 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 In the ER patient was hypotensive, tachycardic and febrile with temperature of 103 F, chest x-ray unremarkable UA unremarkable CT head shows hemorrhagic metastasis. COVID test is negative. Labs show potassium of 2.7, creatinine of 2.7, WBC of 17, lactic acid to be normal. Patient was empirically started on antibiotics with unknown source. Admitted for further work-up of sepsis.   Assessment & Plan:   Principal Problem:   Sepsis ([place]) Active Problems:   Brain metastases ([place])   Seizure ([place])   Hypokalemia   Acute tubulointerstitial nephritis  Goals of care Therapy now recommending custodial care.  Difficult situation.  Sounds like there's Aviya Jarvie court appointed legal guardian who oncology has been discussing case with.  Plan for family meeting regarding permanent legal guardian.  Will touch base with oncology and continue to discuss.  Sepsis of unknown etiology On admission was febrile, tachycardic, tachypneic, with leukocytosis CXR 4/30 with low lung volumes UA bland, urine cx no growth Blood cx NGTD Procalcitonin  14.88-->5.96 --> 2.22 --> 0.14 Continue IV cefepime (pharmacy to renally dose), plan to complete 5 days of cefepime and flagyl given immunocompromised state, discontinue vancomycin with negative MRSA PCR S/P aggressive IV fluids (received about 6-7 L of IVF, BP stable) Monitor closely  Acute metabolic encephalopathy Underlying cognitive impairment Currently at baseline per oncology note from 5/3 Likely 2/2 above CT head showed no significant interval change in size and appearance of multiple hemorrhagic metastasis scattered throughout both cerebral hemispheres with similar localized vasogenic edema  ??Possible secondary adrenal insufficiency ??Steroid withdrawal Patient was unable to keep any medications down, including steriods for the past few days PTA Resume home regimen   AKI History of tubulointerstitial nephritis Noted significant dehydration on admission <1, developed tubulointerstitial nephritis and was discharged with creatinine of 1.8 on steroids  Creatinine bumped to 2.72 on presentation Peaked at 3.14 Improving Low threshold for renal Continue steroids as above  Hypotension BP now stable s/p aggressive IV fluids, stress dose steroids Multifactorial, sepsis, dehydration, possible steroid withdrawal Continue stress dose steroids, tapering  Hypokalemia  Hypomagnesemia Replace as needed  Anemia of chronic disease Hemoglobin baseline around 10-11 Noted hemoglobin drop, likely hemodilution from aggressive IV fluids No evidence of any bleeding Daily CBC  Metastatic melanoma with extensive mets including brain S/p craniotomy and resection of left occipital and left frontal lesion in 1/22 Follows Dr. 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  Weight as of this encounter: 77.1 kg.   DVT prophylaxis: SCD Code Status: DNR Family Communication: none at bedside - wife over phone Disposition:   Status is: Inpatient  Remains inpatient appropriate because:Inpatient level of care appropriate due to severity of illness   Dispo: The patient is from: Home              Anticipated d/c is to: pending              Patient currently is not medically stable to d/c.   Difficult to place patient Yes       Consultants:   oncology  Procedures:  none  Antimicrobials:  Anti-infectives (From admission, onward)   Start     Dose/Rate Route Frequency Ordered Stop   01/16/21 1200  ceFEPIme (MAXIPIME) 2 g in sodium chloride 0.9 % 100 mL IVPB        2 g 200 mL/hr over 30 Minutes Intravenous Every 12 hours 01/16/21 1009 01/18/21 1155   01/15/21 2200  ceFEPIme (MAXIPIME) 2 g in sodium chloride 0.9 % 100 mL IVPB  Status:  Discontinued        2 g 200 mL/hr over 30 Minutes Intravenous Every 24 hours 01/15/21 0725 01/16/21 1009   01/15/21 1000  vancomycin (VANCOREADY) IVPB 1250 mg/250 mL  Status:  Discontinued        1,250 mg 166.7 mL/hr over 90 Minutes Intravenous Every 36 hours 01/14/21 0404 01/16/21 0714   01/14/21 1000  ceFEPIme (MAXIPIME) 2 g in sodium chloride 0.9 % 100 mL IVPB  Status:  Discontinued        2 g 200 mL/hr over 30 Minutes Intravenous Every 12 hours 01/14/21 0404 01/15/21 0725   01/14/21 0600  metroNIDAZOLE (FLAGYL) IVPB 500 mg        500 mg 100 mL/hr over 60 Minutes Intravenous Every 8 hours 01/14/21 0404 01/18/21 2359   01/13/21 2200  ceFEPIme (MAXIPIME) 2 g in sodium chloride 0.9 % 100 mL IVPB        2 g 200 mL/hr over 30 Minutes Intravenous  Once 01/13/21 2154 01/13/21 2322   01/13/21 2200  metroNIDAZOLE (FLAGYL) IVPB 500 mg        500 mg 100 mL/hr over 60 Minutes Intravenous  Once 01/13/21 2154 01/13/21 2322   01/13/21 2200  vancomycin (VANCOCIN) IVPB 1000 mg/200 mL premix  Status:  Discontinued         1,000 mg 200 mL/hr over 60 Minutes Intravenous  Once 01/13/21 2154 01/13/21 2155   01/13/21 2200  vancomycin (VANCOREADY) IVPB 1500 mg/300 mL        1,500 mg 150 mL/hr over 120 Minutes Intravenous  Once 01/13/21 2155 01/14/21 0122         Subjective: Pleasantly confused  Objective: Vitals:   01/17/21 1354 01/17/21 2006 01/18/21 0419 01/18/21 1452  BP: (!) 144/81 (!) 142/79 130/84 121/83  Pulse: 62 60 66 (!) 59  Resp: 16 15 14 16   Temp: 97.7 F (36.5 C) 98 F (36.7 C) 98 F (36.7 C) 98.3 F (36.8 C)  TempSrc: Oral Oral Oral   SpO2: 98% 100% 99% 100%  Weight:      Height:        Intake/Output Summary (Last 24 hours) at 01/18/2021 1500 Last data filed at 01/18/2021 1257 Gross per 24 hour  Intake 220 ml  Output 1900 ml  Net -1680 ml   Filed Weights   01/13/21 2212  Weight: 77.1 kg    Examination:  General: No acute distress. Cardiovascular: Heart sounds show Darrell Hauk regular rate, and rhythm.  Lungs: Clear to auscultation bilaterally Abdomen: Soft, nontender, nondistended  Neurological:  Sleepy today, resting.  Disoriented.  Moves all extremities x4 Skin: Warm and dry. No rashes or lesions. Extremities: No clubbing or cyanosis. No edema.     Data Reviewed: I have personally reviewed following labs and imaging studies  CBC: Recent Labs  Lab 01/14/21 0426 01/15/21 0256 01/16/21 0247 01/17/21 0450 01/18/21 0517  WBC 6.9 4.6 5.6 6.2 5.7  NEUTROABS 5.8 4.1 4.8 5.0 4.2  HGB 9.6* 8.6* 9.4* 10.4* 11.2*  HCT 27.8* 25.9* 28.6* 30.9* 32.8*  MCV 89.4 91.2 91.1 90.4 89.1  PLT 139* 137* 188 228 161    Basic Metabolic Panel: Recent Labs  Lab 01/15/21 0256 01/16/21 0247 01/17/21 0450 01/17/21 1554 01/18/21 0517  NA 140 141 145 142 147*  K 3.5 3.1* 2.8* 2.4* 3.1*  CL 109 110 112* 109 110  CO2 22 22 24 25 28   GLUCOSE 113* 117* 117* 100* 104*  BUN 34* 35* 27* 23* 20  CREATININE 3.14* 2.60* 1.66* 1.23 1.07  CALCIUM 8.0* 8.2* 8.3* 8.1* 8.4*  MG  --   --  1.5*   --  1.6*  PHOS  --   --   --   --  3.2    GFR: Estimated Creatinine Clearance: 84.1 mL/min (by C-G formula based on SCr of 1.07 mg/dL).  Liver Function Tests: Recent Labs  Lab 01/14/21 0426 01/15/21 0256 01/16/21 0247 01/17/21 0450 01/18/21 0517  AST 56* 53* 36 31 36  ALT 34 37 37 35 40  ALKPHOS 69 63 65 66 67  BILITOT 1.2 0.8 1.0 0.5 0.8  PROT 4.4* 4.7* 5.1* 5.5* 5.4*  ALBUMIN 2.2* 2.3* 2.5* 2.6* 2.7*    CBG: Recent Labs  Lab 01/17/21 1204 01/17/21 1655 01/18/21 0025 01/18/21 0528 01/18/21 1202  GLUCAP 100* 89 95 101* 106*     Recent Results (from the past 240 hour(s))  Resp Panel by RT-PCR (Flu Elliot Simoneaux&B, Covid) Nasopharyngeal Swab     Status: None   Collection Time: 01/13/21  9:54 PM   Specimen: Nasopharyngeal Swab; Nasopharyngeal(NP) swabs in vial transport medium  Result Value Ref Range Status   SARS Coronavirus 2 by RT PCR NEGATIVE NEGATIVE Final    Comment: (NOTE) SARS-CoV-2 target nucleic acids are NOT DETECTED.  The SARS-CoV-2 RNA is generally detectable in upper respiratory specimens during the acute phase of infection. The lowest concentration of SARS-CoV-2 viral copies this assay can detect is 138 copies/mL. Kenn Rekowski negative result does not preclude SARS-Cov-2 infection and should not be used as the sole basis for treatment or other patient management decisions. Jomayra Novitsky negative result may occur with  improper specimen collection/handling, submission of specimen other than nasopharyngeal swab, presence of viral mutation(s) within the areas targeted by this assay, and inadequate number of viral copies(<138 copies/mL). Correne Lalani negative result must be combined with clinical observations, patient history, and epidemiological information. The expected result is Negative.  Fact Sheet for Patients:  EntrepreneurPulse.com.au  Fact Sheet for Healthcare Providers:  IncredibleEmployment.be  This test is no t yet approved or cleared by the  [place] FDA and  has been authorized for detection and/or diagnosis of SARS-CoV-2 by FDA under an Emergency Use Authorization (EUA). This EUA will remain  in effect (meaning this test can be used) for the duration of the COVID-19 declaration under Section 564(b)(1) of the Act, 21 U.S.C.section 360bbb-3(b)(1), unless the authorization is terminated  or revoked sooner.       Influenza Mahki Spikes by PCR NEGATIVE NEGATIVE Final   Influenza B by PCR NEGATIVE NEGATIVE Final    Comment: (NOTE) The Xpert Xpress SARS-CoV-2/FLU/RSV plus assay is intended as an aid in the diagnosis of influenza from Nasopharyngeal swab specimens and should not be used as Nicolemarie Wooley sole basis for treatment. Nasal washings and aspirates are unacceptable for Xpert Xpress SARS-CoV-2/FLU/RSV testing.  Fact Sheet for Patients: EntrepreneurPulse.com.au  Fact Sheet for Healthcare Providers: IncredibleEmployment.be  This test is not yet approved or cleared by the [place] FDA and has been authorized for detection and/or diagnosis of SARS-CoV-2 by FDA under an Emergency Use Authorization (EUA). This EUA will remain in effect (meaning this test can be used) for the duration of the COVID-19 declaration under Section 564(b)(1) of the Act, 21 U.S.C. section 360bbb-3(b)(1), unless the authorization is terminated or revoked.  Performed at [facility], [place] [address], [place], [place] [address]   Blood Culture (routine x 2)     Status: None (Preliminary result)   Collection Time: 01/13/21  9:54 PM   Specimen: BLOOD  Result Value Ref Range Status   Specimen Description   Final    BLOOD RIGHT ANTECUBITAL Performed at [facility] [address], [place], [place] [address]    Special Requests   Final    BOTTLES DRAWN AEROBIC ONLY Blood Culture results may not be optimal due to an inadequate volume of blood received in culture bottles Performed at  [place] [address] [place], [place] [address]    Culture   Final    NO GROWTH 4 DAYS Performed at [facility], [place] [address], [place], [place] [address]    Report Status PENDING  Incomplete  Urine culture     Status: None   Collection Time: 01/13/21  9:54 PM   Specimen: In/Out Cath Urine  Result Value Ref Range Status   Specimen Description   Final    IN/OUT CATH URINE Performed at [place] [address] [place], [place] [address]    Special Requests   Final    NONE Performed at [facility], [place] [address] [place], [place] [address]    Culture   Final    NO GROWTH Performed at [facility], [place] [address], [place], [place] [address]    Report Status 01/15/2021 FINAL  Final  Blood Culture (routine x 2)     Status: None (Preliminary result)   Collection Time: 01/13/21  9:59 PM   Specimen: BLOOD  Result Value Ref Range Status   Specimen Description   Final    BLOOD LEFT ANTECUBITAL Performed at [place] [address], [place], [place] [address]    Special Requests   Final    BOTTLES DRAWN AEROBIC AND ANAEROBIC Blood Culture adequate volume Performed at [place] [address] [place], [place] [address]    Culture   Final    NO GROWTH 4 DAYS Performed at [facility], [place] [address] [place],  [address]    Report Status PENDING  Incomplete  MRSA PCR Screening     Status: None   Collection Time: 01/15/21 11:45 AM   Specimen: Nasopharyngeal  Result Value Ref Range Status   MRSA by PCR NEGATIVE NEGATIVE Final    Comment:        The GeneXpert MRSA Assay (FDA approved  for NASAL specimens only), is one component of Chrisette Man comprehensive MRSA colonization surveillance program. It is not intended to diagnose MRSA infection nor to guide or monitor treatment for MRSA infections. Performed at [facility], [place] [address], [place], [place] [address]   C Difficile Quick Screen w PCR reflex     Status: None   Collection Time: 01/16/21 10:42 AM   Specimen: STOOL  Result Value Ref Range Status   C Diff antigen NEGATIVE NEGATIVE Final   C Diff toxin NEGATIVE NEGATIVE Final   C Diff interpretation No C. difficile detected.  Final    Comment: Performed at [facility], [place] [address], [place], [place] [address]         Radiology Studies: No results found.      Scheduled Meds: . Chlorhexidine Gluconate Cloth  6 each Topical Daily  . citalopram  10 mg Oral QHS  . hydrocortisone sod succinate (SOLU-CORTEF) inj  50 mg Intravenous Q12H  . magnesium oxide  400 mg Oral Daily  . mouth rinse  15 mL Mouth Rinse BID  . pantoprazole  40 mg Oral Daily  . potassium chloride  40 mEq Oral Q4H  . senna-docusate  1 tablet Oral QHS   Continuous Infusions: . dextrose 50 mL/hr at 01/18/21 0749  . levETIRAcetam 500 mg (01/18/21 0508)  . metronidazole 500 mg (01/18/21 1312)     LOS: 4 days    Time spent: over 30 min    Fayrene Helper, MD [facility]   To contact the attending provider between 7A-7P or the covering provider during after hours 7P-7A, please log into the web site [email or web address] and access using universal [place] password for that web site. If you do not have the password, please call the hospital operator.  01/18/2021, 3:00 PM

## 2021-01-19 ENCOUNTER — Other Ambulatory Visit: Payer: Self-pay | Admitting: *Deleted

## 2021-01-19 ENCOUNTER — Encounter: Payer: Self-pay | Admitting: *Deleted

## 2021-01-19 DIAGNOSIS — C7931 Secondary malignant neoplasm of brain: Secondary | ICD-10-CM | POA: Diagnosis not present

## 2021-01-19 DIAGNOSIS — C439 Malignant melanoma of skin, unspecified: Secondary | ICD-10-CM

## 2021-01-19 DIAGNOSIS — A419 Sepsis, unspecified organism: Secondary | ICD-10-CM | POA: Diagnosis not present

## 2021-01-19 LAB — CBC WITH DIFFERENTIAL/PLATELET
Abs Immature Granulocytes: 0.05 10*3/uL (ref 0.00–0.07)
Basophils Absolute: 0.1 10*3/uL (ref 0.0–0.1)
Basophils Relative: 1 %
Eosinophils Absolute: 0.2 10*3/uL (ref 0.0–0.5)
Eosinophils Relative: 4 %
HCT: 32.4 % — ABNORMAL LOW (ref 39.0–52.0)
Hemoglobin: 11 g/dL — ABNORMAL LOW (ref 13.0–17.0)
Immature Granulocytes: 1 %
Lymphocytes Relative: 32 %
Lymphs Abs: 1.8 10*3/uL (ref 0.7–4.0)
MCH: 30.1 pg (ref 26.0–34.0)
MCHC: 34 g/dL (ref 30.0–36.0)
MCV: 88.5 fL (ref 80.0–100.0)
Monocytes Absolute: 0.6 10*3/uL (ref 0.1–1.0)
Monocytes Relative: 10 %
Neutro Abs: 2.9 10*3/uL (ref 1.7–7.7)
Neutrophils Relative %: 52 %
Platelets: 222 10*3/uL (ref 150–400)
RBC: 3.66 MIL/uL — ABNORMAL LOW (ref 4.22–5.81)
RDW: 12.4 % (ref 11.5–15.5)
WBC: 5.6 10*3/uL (ref 4.0–10.5)
nRBC: 0 % (ref 0.0–0.2)

## 2021-01-19 LAB — COMPREHENSIVE METABOLIC PANEL
ALT: 42 U/L (ref 0–44)
AST: 44 U/L — ABNORMAL HIGH (ref 15–41)
Albumin: 2.7 g/dL — ABNORMAL LOW (ref 3.5–5.0)
Alkaline Phosphatase: 69 U/L (ref 38–126)
Anion gap: 11 (ref 5–15)
BUN: 13 mg/dL (ref 6–20)
CO2: 21 mmol/L — ABNORMAL LOW (ref 22–32)
Calcium: 8.2 mg/dL — ABNORMAL LOW (ref 8.9–10.3)
Chloride: 108 mmol/L (ref 98–111)
Creatinine, Ser: 0.79 mg/dL (ref 0.61–1.24)
GFR, Estimated: >60 mL/min (ref >60)
Glucose, Bld: 79 mg/dL (ref 70–99)
Potassium: 2.9 mmol/L — ABNORMAL LOW (ref 3.5–5.1)
Sodium: 140 mmol/L (ref 135–145)
Total Bilirubin: 0.7 mg/dL (ref 0.3–1.2)
Total Protein: 5.1 g/dL — ABNORMAL LOW (ref 6.5–8.1)

## 2021-01-19 LAB — BASIC METABOLIC PANEL
Anion gap: 10 (ref 5–15)
BUN: 13 mg/dL (ref 6–20)
CO2: 24 mmol/L (ref 22–32)
Calcium: 8.6 mg/dL — ABNORMAL LOW (ref 8.9–10.3)
Chloride: 108 mmol/L (ref 98–111)
Creatinine, Ser: 0.91 mg/dL (ref 0.61–1.24)
GFR, Estimated: >60 mL/min (ref >60)
Glucose, Bld: 155 mg/dL — ABNORMAL HIGH (ref 70–99)
Potassium: 3.7 mmol/L (ref 3.5–5.1)
Sodium: 142 mmol/L (ref 135–145)

## 2021-01-19 LAB — CULTURE, BLOOD (ROUTINE X 2)
Culture: NO GROWTH
Culture: NO GROWTH
Special Requests: ADEQUATE

## 2021-01-19 LAB — GLUCOSE, CAPILLARY
Glucose-Capillary: 81 mg/dL (ref 70–99)
Glucose-Capillary: 110 mg/dL — ABNORMAL HIGH (ref 70–99)
Glucose-Capillary: 186 mg/dL — ABNORMAL HIGH (ref 70–99)
Glucose-Capillary: 132 mg/dL — ABNORMAL HIGH (ref 70–99)

## 2021-01-19 LAB — PROCALCITONIN: Procalcitonin: <0.1 ng/mL

## 2021-01-19 MED ORDER — KCL IN DEXTROSE-NACL 30-5-0.45 MEQ/L-%-% IV SOLN
INTRAVENOUS | Status: DC
  Filled 2021-01-19 (×3): qty 1000

## 2021-01-19 MED ORDER — POTASSIUM CHLORIDE CRYS ER 20 MEQ PO TBCR
40.0000 meq | EXTENDED_RELEASE_TABLET | ORAL | Status: AC
  Administered 2021-01-19 (×2): 40 meq via ORAL
  Filled 2021-01-19 (×3): qty 2

## 2021-01-19 MED ORDER — LEVETIRACETAM 500 MG PO TABS: 500.0000 mg | ORAL_TABLET | Freq: Two times a day (BID) | ORAL | Status: DC

## 2021-01-19 MED ORDER — LEVETIRACETAM 100 MG/ML PO SOLN
500.0000 mg | Freq: Two times a day (BID) | ORAL | Status: DC
  Administered 2021-01-19 – 2021-01-29 (×19): 500 mg via ORAL
  Filled 2021-01-19 (×23): qty 5

## 2021-01-19 MED ORDER — PREDNISONE 20 MG PO TABS
20.0000 mg | ORAL_TABLET | Freq: Every day | ORAL | Status: DC
  Administered 2021-01-20 – 2021-01-22 (×3): 20 mg via ORAL
  Filled 2021-01-19 (×3): qty 1

## 2021-01-19 NOTE — TOC Progression Note (Addendum)
Transition of Care [facility]) - Progression Note    Patient Details  Name: Danen Lapaglia MRN: [id number] Date of Birth: 02/18/1966  Transition of Care [facility]) CM/SW [place], [place] Phone Number: 01/19/2021, 3:11 PM  Clinical Narrative:   Spoke with GAL, Dorise Hiss, [phone], who confirmed that wife was awarded guardianship, and furthermore stated relationship between wife and family is contentious, that wife is in favor of further treatment while family embraces hospice.  I spoke with wife who confirmed this, and is wanting to continue to support patient in "getting better."  I let her know that I had spoken with Shirlee Limerick at [facility] who stated they would take him back only with palliative or hospice referral.  After explaining the difference, she stated she would be OK with palliative referral as she is working full time and unable to care for him at home.  Referral made to [facility]. Spoke with [employer].  TOC will continue to follow during the course of hospitalization.     Expected Discharge Plan: [place] Barriers to Discharge: Continued Medical Work up  Expected Discharge Plan and Services Expected Discharge Plan: [place]   Discharge Planning Services: CM Consult   Living arrangements for the past 2 months: [place]                                       Social Determinants of Health (SDOH) Interventions    Readmission Risk Interventions No flowsheet data found.

## 2021-01-19 NOTE — Progress Notes (Addendum)
HEMATOLOGY-ONCOLOGY PROGRESS NOTE  SUBJECTIVE: Nurse reports that the patient was up most the night.  He was somewhat combative overnight.  Only slept about 30 minutes.  He is sleeping currently.  No other concerns voiced.  Oncology History  Melanoma of skin ([place])  10/05/2020 Initial Diagnosis   Melanoma of skin ([place])   10/05/2020 Cancer Staging   Staging form: Melanoma of the Skin, AJCC 8th Edition - Pathologic: Stage IV (pTX, pNX, pM1) - Signed by [name], MD on 10/05/2020   10/16/2020 -  Chemotherapy    Patient is on Treatment Plan: MELANOMA NIVOLUMAB + IPILIMUMAB (1/3) Q21D / NIVOLUMAB Q14D       PHYSICAL EXAMINATION:  Vitals:   01/18/21 2140 01/19/21 0341  BP: 132/80 129/81  Pulse: 62 60  Resp: 18 15  Temp: (!) 97.5 F (36.4 C) (!) 97.4 F (36.3 C)  SpO2: 99% 100%   Filed Weights   01/13/21 2212  Weight: 77.1 kg    Intake/Output from previous day: 05/05 0701 - 05/06 0700 In: 1374.1 [P.O.:460; I.V.:274.9; IV Piggyback:639.2] Out: 2800 [Urine:2800]  GENERAL: Resting quietly, did not awaken due to overnight events  LABORATORY DATA:  I have reviewed the data as listed CMP Latest Ref Rng & Units 01/19/2021 01/18/2021 01/18/2021  Glucose 70 - 99 mg/dL 79 96 104(H)  BUN 6 - 20 mg/dL 13 17 20   Creatinine 0.61 - 1.24 mg/dL 0.79 0.89 1.07  Sodium 135 - 145 mmol/L 140 142 147(H)  Potassium 3.5 - 5.1 mmol/L 2.9(L) 2.7(LL) 3.1(L)  Chloride 98 - 111 mmol/L 108 109 110  CO2 22 - 32 mmol/L 21(L) 22 28  Calcium 8.9 - 10.3 mg/dL 8.2(L) 8.0(L) 8.4(L)  Total Protein 6.5 - 8.1 g/dL 5.1(L) - 5.4(L)  Total Bilirubin 0.3 - 1.2 mg/dL 0.7 - 0.8  Alkaline Phos 38 - 126 U/L 69 - 67  AST 15 - 41 U/L 44(H) - 36  ALT 0 - 44 U/L 42 - 40    Lab Results  Component Value Date   WBC 5.6 01/19/2021   HGB 11.0 (L) 01/19/2021   HCT 32.4 (L) 01/19/2021   MCV 88.5 01/19/2021   PLT 222 01/19/2021   NEUTROABS 2.9 01/19/2021    CT Head Wo Contrast  Result Date: 01/13/2021 CLINICAL  DATA:  Initial evaluation for acute delirium. EXAM: CT HEAD WITHOUT CONTRAST TECHNIQUE: Contiguous axial images were obtained from the base of the skull through the vertex without intravenous contrast. COMPARISON:  Recent CT from 12/18/2020. FINDINGS: Brain: Again seen are multiple hemorrhagic metastases scattered throughout both cerebral hemispheres. Overall, lesions are not appreciably changed or progressed as compared to most recent CT. Most prominent of these lesions positioned at the left parietal lobe in measures approximately 1.8 cm. Localized vasogenic edema about the preponderance of these lesions without midline shift or significant regional mass effect. No evidence for new/interval hemorrhage by CT. No appreciable infratentorial involvement. No acute large vessel territory infarct. Ventricles stable in size without hydrocephalus. No extra-axial fluid collection. Vascular: No hyperdense vessel. Skull: Scalp soft tissues demonstrate no acute finding. Post craniotomy [name] is noted at the left calvarium. Sinuses/Orbits: Globes and orbital soft tissues demonstrate no acute finding. Visualized paranasal sinuses are clear. No mastoid effusion. Other: None. IMPRESSION: 1. No significant interval change in size and appearance of multiple hemorrhagic metastases scattered throughout both cerebral hemispheres, with similar localized vasogenic edema. No midline shift or significant regional mass effect. 2. No other acute intracranial abnormality. Electronically Signed   By: Marland Kitchen  Jeannine Boga M.D.   On: 01/13/2021 23:16   US RENAL  Result Date: 12/26/2020 CLINICAL DATA:  55 year old male with acute renal insufficiency. Metastatic melanoma. EXAM: RENAL / URINARY TRACT ULTRASOUND COMPLETE COMPARISON:  CT Abdomen and Pelvis 09/14/2020. FINDINGS: Right Kidney: Renal measurements: 13.3 x 6.7 x 8.8 cm = volume: 406 mL. Echogenicity within normal limits. No mass or hydronephrosis visualized. Left Kidney: Renal  measurements: 13.5 x 7.3 by 6.9 cm = volume: 355 mL. Echogenicity within normal limits. No mass or hydronephrosis visualized. Bladder: Diminutive.  Appears normal for degree of bladder distention. Other: None. IMPRESSION: Normal ultrasound appearance of both kidneys and the urinary bladder. Electronically Signed   By: Genevie Ann M.D.   On: 12/26/2020 08:17   DG Chest Port 1 View  Result Date: 01/13/2021 CLINICAL DATA:  Unresponsive EXAM: PORTABLE CHEST 1 VIEW COMPARISON:  12/18/2020 FINDINGS: The heart size and mediastinal contours are within normal limits. Both lungs are clear. The visualized skeletal structures are unremarkable. The lung volumes are low. These low lung volumes likely accentuate the upper mediastinum. IMPRESSION: Low lung volumes.  Otherwise, stable exam. Electronically Signed   By: Constance Holster M.D.   On: 01/13/2021 23:05   US BIOPSY (KIDNEY)  Result Date: 12/26/2020 INDICATION: 55 year old male with a history of melanoma metastatic to the brain and now acute kidney injury. Concern for drug related nephritis. He presents for random renal biopsy. EXAM: ULTRASOUND GUIDED RENAL BIOPSY COMPARISON:  None. MEDICATIONS: Fentanyl 50 mcg IV; Versed 4 mg IV ANESTHESIA/SEDATION: Total Moderate Sedation time 10 minutes The patient's vital signs and level of consciousness were monitored continuously by radiology nursing under my direct supervision. COMPLICATIONS: None immediate PROCEDURE: Informed written consent was obtained from the patient after a discussion of the risks, benefits and alternatives to treatment. The patient understands and consents the procedure. A timeout was performed prior to the initiation of the procedure. Ultrasound scanning was performed of the bilateral flanks. The inferior pole of the right kidney was selected for biopsy due to location and sonographic window. The procedure was planned. The operative site was prepped and draped in the usual sterile fashion. The overlying  soft tissues were anesthetized with 1% lidocaine with epinephrine. A 17 gauge core needle biopsy device was advanced into the inferior cortex of the right kidney and 2 core biopsies were obtained under direct ultrasound guidance. Images were saved for documentation purposes. The biopsy device was removed and hemostasis was obtained with manual compression. Post procedural scanning was negative for significant post procedural hemorrhage or additional complication. A dressing was placed. The patient tolerated the procedure well without immediate post procedural complication. IMPRESSION: Technically successful ultrasound guided right renal biopsy. Electronically Signed   By: Jacqulynn Cadet M.D.   On: 12/26/2020 14:14    ASSESSMENT AND PLAN: 1. 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, unknown primary tumor site  CT brain 09/14/2020-multiple hemorrhagic metastases with surrounding edema in the bilateral cerebral hemispheres  CTs chest, abdomen, pelvis 09/14/2020-1.4 cm right upper lobe subpleural nodule, 0.5 cm right lower lobe nodule, multiple hypodensities in the liver suspicious for metastases, 1.5 cm right upper pole hypodense kidney lesion-indeterminate  MRI abdomen 09/15/2020-multifocal T1 hyperintense liver lesions, no kidney mass  Ultrasound-guided biopsy of a segment 4B liver lesion 09/18/2020-benign liver parenchyma with steatosis, no evidence of malignancy  PET 09/25/2020-focal area of hypermetabolism in the right hilum, multiple areas of the liver, pelvic musculature, and left thigh-no CT correlate with any of these areas on the noncontrast CT,  10 mm anterior right upper lobe nodule with low-level hypermetabolism, additional tiny pulmonary nodules too small to characterize by PET  SRS to 10 brain lesions 09/27/2020  Resection of left occipital and left frontal lesions on 09/29/2020-pathology consistent with malignant melanoma  Cycle 1 ipilimumab/nivolumab  10/16/2020  CT of the brain without contrast 10/26/2020 showed significant interval increase in the number and size of numerous metastases  Brain MRI 10/24/2020-multiple new and increased brain metastases, moderate edema surrounding multiple lesions, new mass-effect at the right lateral ventricle  Brain MRI 11/01/2020-stable appearance of his multiple intracranial metastases and associated edema and mild mass-effect.  CT of the chest with contrast 11/01/2020-interval progression of bilateral pulmonary nodules concerning for metastatic disease, small right hilar lymph nodes, new and progressive ill-defined lesions in the liver compatible with progression of metastatic involvement.  Cycle 2 nivolumab 11/10/2020  Cycle 3 nivolumab 11/27/2020  MRI brain 12/13/2020-decrease in size of hemorrhagic brain metastases, improvement in vasogenic edema and mass-effect, new areas of nonhemorrhagic enhancement by frontal and right parietal white matter-nonspecific  CT chest 12/08/2020-decreased size of lung and liver metastases 2.Left-sided weakness, new onset seizure secondary to #1, weakness has resolved 3.Hyperlipidemia 4.Elevated liver enzymes and bilirubin  Liver enzymes persistently elevated 5.Headache and nausea/vomiting secondary to #1, resolved 6. Hospital admission 10/20/2020-encephalopathy, slow Decadron taper-down to 2 mg at discharge 11/09/2020 7.  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 [facility] admission 01/13/2021- sepsis of unclear source, AKI 9.  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 Hollern appears appears unchanged.  He remains confused.  His hemoglobin is stable and creatinine has normalized.  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  He is receiving his home dose of prednisone.  He should continue this.  His case was discussed with his court-appointed guardian by phone yesterday.  There was a family meeting yesterday and we are awaiting an update about this.  His renal function has normalized and we can consider rechallenge in him with nivolumab versus enrolling in hospice.  We are hopeful to give an update today regarding the outcome of the family meeting today.  Recommendations: 1.  Continue seizure medication. 2.  Continue prednisone, begin taper 3.  We will plan to rechallenge him with nivolumab as an outpatient.  4.  Call oncology over the weekend for questions.  Will check on him again on Monday if he remains in the hospital.   LOS: 5 days   Sean Bussing, DNP, AGPCNP-BC, AOCNP 01/19/21  Mr. Otter was lethargic when I saw him early this morning, approximately 7 AM.  He was arousable, but did not cooperate with exam. His renal function has returned to baseline.  No source for infection has been identified.  Cultures remain negative.  He is stable for discharge to a skilled nursing facility from an oncology standpoint.  I discussed the case with his wife by telephone at approximately 1:30 PM today.  She reports she has been appointed his legal guardian after the hearing yesterday.  We discussed his prognosis and treatment options.  She understands no therapy will be curative and it is unlikely his mental status will return to baseline.  She is encouraged that he knows her and would like to continue treatment with the nivolumab. I recommend tapering the prednisone down to 10 mg daily over the next week.  He will return for an office visit with the plan to resume nivolumab on 01/26/2021.  I was present for greater than 50% of today's visit.  I performed medical decision making.

## 2021-01-19 NOTE — Progress Notes (Signed)
Per Dr. [name]: Needs lab/OV/Nivolumab on 5/13. Call wife and [facility] w/appointment. Scheduling message sent.

## 2021-01-19 NOTE — Progress Notes (Signed)
PROGRESS NOTE    Sean [name]  [id number] DOB: [birth year] DOA: 01/13/2021 PCP: [name], MD    Chief Complaint  Patient presents with  . 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 Patient's wife states that he was at his baseline mental status 1 day PTA, when she checked on him at the facility. Last few days patient has been having persistent nausea, vomiting unable to keep his medications. Has been doing poorly last few days. 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 In the ER patient was hypotensive, tachycardic and febrile with temperature of 103 F, chest x-ray unremarkable UA unremarkable CT head shows hemorrhagic metastasis. COVID test is negative. Labs show potassium of 2.7, creatinine of 2.7, WBC of 17, lactic acid to be normal. Patient was empirically started on antibiotics with unknown source. Admitted for further work-up of sepsis.   Assessment & Plan:   Principal Problem:   Sepsis ([place]) Active Problems:   Brain metastases ([place])   Seizure ([place])   Hypokalemia   Acute tubulointerstitial nephritis  Goals of care Therapy now recommending custodial care.  Difficult situation.  Apparently [name] has been appointed legal guardian, is interested in continuing treatment - reportedly contentious relationship between wife and family, family embraces hospice while wife in favor of further treatment - per SW note from today.  At this time, will plan to discharge to SNF when he's stable.  Sounds like [place] will only take him back with palliative or hospice referral, she's decided  for palliative care. Plan for family meeting regarding permanent legal guardian.  Will touch base with oncology and continue to discuss.  Sepsis of unknown etiology On admission was febrile, tachycardic, tachypneic, with leukocytosis CXR 4/30 with low lung volumes UA bland, urine cx no growth Blood cx NGTD Procalcitonin 14.88-->5.96 --> 2.22 --> 0.14 Continue IV cefepime (pharmacy to renally dose), plan to complete 5 days of cefepime and flagyl given immunocompromised state, discontinue vancomycin with negative MRSA PCR S/P aggressive IV fluids (received about 6-7 L of IVF, BP stable) Monitor closely  Acute metabolic encephalopathy Underlying cognitive impairment Currently at baseline per oncology note from 5/3 Likely 2/2 above CT head showed no significant interval change in size and appearance of multiple hemorrhagic metastasis scattered throughout both cerebral hemispheres with similar localized vasogenic edema  ??Possible secondary adrenal insufficiency ??Steroid withdrawal Patient was unable to keep any medications down, including steriods for the past few days PTA Resume home regimen - oncology recommending tapering steroids down to 10 mg daily over the next week  AKI History of tubulointerstitial nephritis Noted significant dehydration on admission <1, developed tubulointerstitial nephritis and was discharged with creatinine of 1.8 on steroids  Creatinine bumped to 2.72 on presentation Peaked at 3.14 Improving Low threshold for renal Continue steroids as above - tapering steroids as noted above (discussed with renal as well)  Hypokalemia  Hypomagnesemia Replace as needed Has been somewhat difficult to replete, continue to follow   Hypotension BP now stable s/p aggressive IV fluids, stress dose steroids Multifactorial, sepsis, dehydration, possible steroid withdrawal Continue stress dose steroids, tapering  Anemia of chronic disease Hemoglobin baseline around  10-11  Noted hemoglobin drop, likely hemodilution from aggressive IV fluids No evidence of any bleeding Daily CBC  Metastatic melanoma with extensive mets including brain S/p craniotomy and resection of left occipital and left frontal lesion in 1/22 Follows Dr. 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   Weight as of this encounter: 77.1 kg.   DVT prophylaxis: SCD Code Status: DNR Family Communication: none at bedside - wife over phone Disposition:   Status is: Inpatient  Remains inpatient appropriate because:Inpatient level of care appropriate due to severity of illness   Dispo: The patient is from: Home              Anticipated d/c is to: pending              Patient currently is not medically stable to d/c.   Difficult to place patient Yes       Consultants:   oncology  Procedures:  none  Antimicrobials:  Anti-infectives (From admission, onward)   Start     Dose/Rate Route Frequency Ordered Stop   01/16/21 1200  ceFEPIme (MAXIPIME) 2 g in sodium chloride 0.9 % 100 mL IVPB        2 g 200 mL/hr over 30 Minutes Intravenous Every 12 hours 01/16/21 1009 01/18/21 1155   01/15/21 2200  ceFEPIme (MAXIPIME) 2 g in sodium chloride 0.9 % 100 mL IVPB  Status:  Discontinued        2 g 200 mL/hr over 30 Minutes Intravenous Every 24 hours 01/15/21 0725 01/16/21 1009   01/15/21 1000  vancomycin (VANCOREADY) IVPB 1250 mg/250 mL  Status:  Discontinued        1,250 mg 166.7 mL/hr over 90 Minutes Intravenous Every 36 hours 01/14/21 0404 01/16/21 0714   01/14/21 1000  ceFEPIme (MAXIPIME) 2 g in sodium chloride 0.9 % 100 mL IVPB  Status:  Discontinued        2 g 200 mL/hr over 30 Minutes Intravenous Every 12 hours 01/14/21 0404 01/15/21 0725    01/14/21 0600  metroNIDAZOLE (FLAGYL) IVPB 500 mg  Status:  Discontinued        500 mg 100 mL/hr over 60 Minutes Intravenous Every 8 hours 01/14/21 0404 01/18/21 1503   01/13/21 2200  ceFEPIme (MAXIPIME) 2 g in sodium chloride 0.9 % 100 mL IVPB        2 g 200 mL/hr over 30 Minutes Intravenous  Once 01/13/21 2154 01/13/21 2322   01/13/21 2200  metroNIDAZOLE (FLAGYL) IVPB 500 mg        500 mg 100 mL/hr over 60 Minutes Intravenous  Once 01/13/21 2154 01/13/21 2322   01/13/21 2200  vancomycin (VANCOCIN) IVPB 1000 mg/200 mL premix  Status:  Discontinued        1,000 mg 200 mL/hr over 60 Minutes Intravenous  Once 01/13/21 2154 01/13/21 2155   01/13/21 2200  vancomycin (VANCOREADY) IVPB 1500 mg/300 mL        1,500 mg 150 mL/hr over 120 Minutes Intravenous  Once 01/13/21 2155 01/14/21 0122         Subjective: Pleasantly confused  Objective: Vitals:   01/18/21 1452 01/18/21 2140 01/19/21 0341 01/19/21 1256  BP: 121/83 132/80 129/81 111/75  Pulse: (!) 59 62 60 69  Resp: 16 18 15  16  Temp: 98.3 F (36.8 C) (!) 97.5 F (36.4 C) (!) 97.4 F (36.3 C) 97.6 F (36.4 C)  TempSrc:  Oral Oral Oral  SpO2: 100% 99% 100% 100%  Weight:      Height:        Intake/Output Summary (Last 24 hours) at 01/19/2021 1755 Last data filed at 01/19/2021 1610 Gross per 24 hour  Intake 595.14 ml  Output 3550 ml  Net -2954.86 ml   Filed Weights   01/13/21 2212  Weight: 77.1 kg    Examination:  General: No acute distress. Posey belt, sitter. Cardiovascular: Heart sounds show Ha Shannahan regular rate, and rhythm Lungs: Clear to auscultation bilaterally  Abdomen: Soft, nontender, nondistended Neurological: Alert, but disoireinted Moves all extremities 4. Cranial nerves II through XII grossly intact.      Data Reviewed: I have personally reviewed following labs and imaging studies  CBC: Recent Labs  Lab 01/15/21 0256 01/16/21 0247 01/17/21 0450 01/18/21 0517 01/19/21 0429  WBC 4.6 5.6 6.2 5.7 5.6   NEUTROABS 4.1 4.8 5.0 4.2 2.9  HGB 8.6* 9.4* 10.4* 11.2* 11.0*  HCT 25.9* 28.6* 30.9* 32.8* 32.4*  MCV 91.2 91.1 90.4 89.1 88.5  PLT 137* 188 228 241 [id number]    Basic Metabolic Panel: Recent Labs  Lab 01/17/21 0450 01/17/21 1554 01/18/21 0517 01/18/21 1831 01/19/21 0429  NA 145 142 147* 142 140  K 2.8* 2.4* 3.1* 2.7* 2.9*  CL 112* 109 110 109 108  CO2 24 25 28 22  21*  GLUCOSE 117* 100* 104* 96 79  BUN 27* 23* 20 17 13   CREATININE 1.66* 1.23 1.07 0.89 0.79  CALCIUM 8.3* 8.1* 8.4* 8.0* 8.2*  MG 1.5*  --  1.6*  --   --   PHOS  --   --  3.2  --   --     GFR: Estimated Creatinine Clearance: 112.4 mL/min (by C-G formula based on SCr of 0.79 mg/dL).  Liver Function Tests: Recent Labs  Lab 01/15/21 0256 01/16/21 0247 01/17/21 0450 01/18/21 0517 01/19/21 0429  AST 53* 36 31 36 44*  ALT 37 37 35 40 42  ALKPHOS 63 65 66 67 69  BILITOT 0.8 1.0 0.5 0.8 0.7  PROT 4.7* 5.1* 5.5* 5.4* 5.1*  ALBUMIN 2.3* 2.5* 2.6* 2.7* 2.7*    CBG: Recent Labs  Lab 01/18/21 1202 01/18/21 1718 01/18/21 2144 01/19/21 0531 01/19/21 1252  GLUCAP 106* 106* 93 81 110*     Recent Results (from the past 240 hour(s))  Resp Panel by RT-PCR (Flu Blanton Kardell&B, Covid) Nasopharyngeal Swab     Status: None   Collection Time: 01/13/21  9:54 PM   Specimen: Nasopharyngeal Swab; Nasopharyngeal(NP) swabs in vial transport medium  Result Value Ref Range Status   SARS Coronavirus 2 by RT PCR NEGATIVE NEGATIVE Final    Comment: (NOTE) SARS-CoV-2 target nucleic acids are NOT DETECTED.  The SARS-CoV-2 RNA is generally detectable in upper respiratory specimens during the acute phase of infection. The lowest concentration of SARS-CoV-2 viral copies this assay can detect is 138 copies/mL. Jazmin Ley negative result does not preclude SARS-Cov-2 infection and should not be used as the sole basis for treatment or other patient management decisions. Teigan Sahli negative result may occur with  improper specimen collection/handling,  submission of specimen other than nasopharyngeal swab, presence of viral mutation(s) within the areas targeted by this assay, and inadequate number of viral copies(<138 copies/mL). Anatasia Tino negative result must be combined with clinical observations, patient history, and epidemiological information. The expected result is  Negative.  Fact Sheet for Patients:  EntrepreneurPulse.com.au  Fact Sheet for Healthcare Providers:  IncredibleEmployment.be  This test is no t yet approved or cleared by the [place] FDA and  has been authorized for detection and/or diagnosis of SARS-CoV-2 by FDA under an Emergency Use Authorization (EUA). This EUA will remain  in effect (meaning this test can be used) for the duration of the COVID-19 declaration under Section 564(b)(1) of the Act, 21 U.S.C.section 360bbb-3(b)(1), unless the authorization is terminated  or revoked sooner.       Influenza Arrin Ishler by PCR NEGATIVE NEGATIVE Final   Influenza B by PCR NEGATIVE NEGATIVE Final    Comment: (NOTE) The Xpert Xpress SARS-CoV-2/FLU/RSV plus assay is intended as an aid in the diagnosis of influenza from Nasopharyngeal swab specimens and should not be used as Jennipher Weatherholtz sole basis for treatment. Nasal washings and aspirates are unacceptable for Xpert Xpress SARS-CoV-2/FLU/RSV testing.  Fact Sheet for Patients: EntrepreneurPulse.com.au  Fact Sheet for Healthcare Providers: IncredibleEmployment.be  This test is not yet approved or cleared by the [place] FDA and has been authorized for detection and/or diagnosis of SARS-CoV-2 by FDA under an Emergency Use Authorization (EUA). This EUA will remain in effect (meaning this test can be used) for the duration of the COVID-19 declaration under Section 564(b)(1) of the Act, 21 U.S.C. section 360bbb-3(b)(1), unless the authorization is terminated or revoked.  Performed at [facility], [place] [address], [place], [place] [address]   Blood Culture (routine x 2)     Status: None   Collection Time: 01/13/21  9:54 PM   Specimen: BLOOD  Result Value Ref Range Status   Specimen Description   Final    BLOOD RIGHT ANTECUBITAL Performed at [facility] [address] [place], [place] [address]    Special Requests   Final    BOTTLES DRAWN AEROBIC ONLY Blood Culture results may not be optimal due to an inadequate volume of blood received in culture bottles Performed at [facility] [address] [place], [place] [address]    Culture   Final    NO GROWTH 5 DAYS Performed at [facility], [place] [address], [place], [place] [address]    Report Status 01/19/2021 FINAL  Final  Urine culture     Status: None   Collection Time: 01/13/21  9:54 PM   Specimen: In/Out Cath Urine  Result Value Ref Range Status   Specimen Description   Final    IN/OUT CATH URINE Performed at [facility] [address], [place], [place] [address]    Special Requests   Final    NONE Performed at [facility], [place] [address] [place], [place] [address]    Culture   Final    NO GROWTH Performed at [facility], [place] [address] [place], [place] [address]    Report Status 01/15/2021 FINAL  Final  Blood Culture (routine x 2)     Status: None   Collection Time: 01/13/21  9:59 PM   Specimen: BLOOD  Result Value Ref Range Status   Specimen Description   Final    BLOOD LEFT ANTECUBITAL Performed at [place] [address], [place], [place] [address]    Special Requests   Final    BOTTLES DRAWN AEROBIC AND ANAEROBIC Blood Culture adequate volume Performed at [facility] [address] [place] [address], [place], [place] [address]    Culture  Final    NO GROWTH 5 DAYS Performed at [facility], [place] [address] [place], [place] [address]    Report Status 01/19/2021  FINAL  Final  MRSA PCR Screening     Status: None   Collection Time: 01/15/21 11:45 AM   Specimen: Nasopharyngeal  Result Value Ref Range Status   MRSA by PCR NEGATIVE NEGATIVE Final    Comment:        The GeneXpert MRSA Assay (FDA approved for NASAL specimens only), is one component of Johnita Palleschi comprehensive MRSA colonization surveillance program. It is not intended to diagnose MRSA infection nor to guide or monitor treatment for MRSA infections. Performed at [facility], [place] [address], [place], [place] [address]   C Difficile Quick Screen w PCR reflex     Status: None   Collection Time: 01/16/21 10:42 AM   Specimen: STOOL  Result Value Ref Range Status   C Diff antigen NEGATIVE NEGATIVE Final   C Diff toxin NEGATIVE NEGATIVE Final   C Diff interpretation No C. difficile detected.  Final    Comment: Performed at [facility], [place] [address] [place], [place] [address]         Radiology Studies: No results found.      Scheduled Meds: . Chlorhexidine Gluconate Cloth  6 each Topical Daily  . citalopram  10 mg Oral QHS  . levETIRAcetam  500 mg Oral BID  . magnesium oxide  400 mg Oral Daily  . mouth rinse  15 mL Mouth Rinse BID  . pantoprazole  40 mg Oral Daily  . potassium chloride  40 mEq Oral Q4H  . [START ON 01/20/2021] predniSONE  20 mg Oral Q breakfast  . senna-docusate  1 tablet Oral QHS   Continuous Infusions: . dexrose 5 % and 0.45 % NaCl with KCl 30 mEq/L 75 mL/hr at 01/19/21 1236     LOS: 5 days    Time spent: over 30 min    Fayrene Helper, MD [facility]   To contact the attending provider between 7A-7P or the covering provider during after hours 7P-7A, please log into the web site [email or web address] and access using universal [place] password for that web site. If you do not have the password, please call the hospital operator.  01/19/2021, 5:55 PM

## 2021-01-19 NOTE — Progress Notes (Signed)
[facility]) Outpatient Palliative Care  Referral received for outpatient palliative care services once he is discharged back to [facility].  [facility] will continue to follow.  Venia Carbon RN, BSN, [facility] Liaison

## 2021-01-20 ENCOUNTER — Inpatient Hospital Stay (HOSPITAL_COMMUNITY): Payer: 59

## 2021-01-20 DIAGNOSIS — M7989 Other specified soft tissue disorders: Secondary | ICD-10-CM | POA: Diagnosis not present

## 2021-01-20 DIAGNOSIS — A419 Sepsis, unspecified organism: Secondary | ICD-10-CM | POA: Diagnosis not present

## 2021-01-20 LAB — COMPREHENSIVE METABOLIC PANEL
ALT: 42 U/L (ref 0–44)
AST: 35 U/L (ref 15–41)
Albumin: 3.1 g/dL — ABNORMAL LOW (ref 3.5–5.0)
Alkaline Phosphatase: 79 U/L (ref 38–126)
Anion gap: 8 (ref 5–15)
BUN: 13 mg/dL (ref 6–20)
CO2: 22 mmol/L (ref 22–32)
Calcium: 8.3 mg/dL — ABNORMAL LOW (ref 8.9–10.3)
Chloride: 105 mmol/L (ref 98–111)
Creatinine, Ser: 0.8 mg/dL (ref 0.61–1.24)
GFR, Estimated: >60 mL/min (ref >60)
Glucose, Bld: 126 mg/dL — ABNORMAL HIGH (ref 70–99)
Potassium: 3.3 mmol/L — ABNORMAL LOW (ref 3.5–5.1)
Sodium: 135 mmol/L (ref 135–145)
Total Bilirubin: 0.6 mg/dL (ref 0.3–1.2)
Total Protein: 6 g/dL — ABNORMAL LOW (ref 6.5–8.1)

## 2021-01-20 LAB — CBC WITH DIFFERENTIAL/PLATELET
Abs Immature Granulocytes: 0.08 10*3/uL — ABNORMAL HIGH (ref 0.00–0.07)
Basophils Absolute: 0 10*3/uL (ref 0.0–0.1)
Basophils Relative: 1 %
Eosinophils Absolute: 0.1 10*3/uL (ref 0.0–0.5)
Eosinophils Relative: 2 %
HCT: 36.7 % — ABNORMAL LOW (ref 39.0–52.0)
Hemoglobin: 12.6 g/dL — ABNORMAL LOW (ref 13.0–17.0)
Immature Granulocytes: 1 %
Lymphocytes Relative: 24 %
Lymphs Abs: 1.4 10*3/uL (ref 0.7–4.0)
MCH: 30.3 pg (ref 26.0–34.0)
MCHC: 34.3 g/dL (ref 30.0–36.0)
MCV: 88.2 fL (ref 80.0–100.0)
Monocytes Absolute: 0.6 10*3/uL (ref 0.1–1.0)
Monocytes Relative: 10 %
Neutro Abs: 3.7 10*3/uL (ref 1.7–7.7)
Neutrophils Relative %: 62 %
Platelets: 255 10*3/uL (ref 150–400)
RBC: 4.16 MIL/uL — ABNORMAL LOW (ref 4.22–5.81)
RDW: 12.6 % (ref 11.5–15.5)
WBC: 6 10*3/uL (ref 4.0–10.5)
nRBC: 0 % (ref 0.0–0.2)

## 2021-01-20 LAB — GLUCOSE, CAPILLARY
Glucose-Capillary: 108 mg/dL — ABNORMAL HIGH (ref 70–99)
Glucose-Capillary: 110 mg/dL — ABNORMAL HIGH (ref 70–99)
Glucose-Capillary: 130 mg/dL — ABNORMAL HIGH (ref 70–99)
Glucose-Capillary: 138 mg/dL — ABNORMAL HIGH (ref 70–99)

## 2021-01-20 MED ORDER — LORAZEPAM 2 MG/ML IJ SOLN
1.0000 mg | INTRAMUSCULAR | Status: DC | PRN
  Administered 2021-01-20 – 2021-01-24 (×4): 1 mg via INTRAVENOUS
  Filled 2021-01-20 (×4): qty 1

## 2021-01-20 MED ORDER — POTASSIUM CHLORIDE CRYS ER 20 MEQ PO TBCR
40.0000 meq | EXTENDED_RELEASE_TABLET | Freq: Every day | ORAL | Status: DC
  Administered 2021-01-20 – 2021-01-26 (×7): 40 meq via ORAL
  Filled 2021-01-20 (×3): qty 2
  Filled 2021-01-20: qty 4
  Filled 2021-01-20 (×3): qty 2

## 2021-01-20 MED ORDER — TRAZODONE HCL 50 MG PO TABS
25.0000 mg | ORAL_TABLET | Freq: Every day | ORAL | Status: DC
  Administered 2021-01-21 – 2021-01-28 (×8): 25 mg via ORAL
  Filled 2021-01-20 (×9): qty 1

## 2021-01-20 NOTE — Progress Notes (Signed)
PROGRESS NOTE    Sean [name]  RUE:[id number] DOB: [birth year] DOA: 01/13/2021 PCP: [name], MD    Chief Complaint  Patient presents with  . 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 Patient's wife states that he was at his baseline mental status 1 day PTA, when she checked on him at the facility. Last few days patient has been having persistent nausea, vomiting unable to keep his medications. Has been doing poorly last few days. 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 In the ER patient was hypotensive, tachycardic and febrile with temperature of 103 F, chest x-ray unremarkable UA unremarkable CT head shows hemorrhagic metastasis. COVID test is negative. Labs show potassium of 2.7, creatinine of 2.7, WBC of 17, lactic acid to be normal. Patient was empirically started on antibiotics with unknown source. Admitted for further work-up of sepsis.   Assessment & Plan:   Principal Problem:   Sepsis ([place]) Active Problems:   Brain metastases ([place])   Seizure ([place])   Hypokalemia   Acute tubulointerstitial nephritis  Goals of care Therapy now recommending custodial care.  Difficult situation.  Apparently Langley [name] has been appointed legal guardian, is interested in continuing treatment - reportedly contentious relationship between wife and family, family embraces hospice while wife in favor of further treatment - per SW note from today.  At this time, will plan to discharge to SNF when he's stable.  Sounds like [place] will only take him back with palliative or hospice referral, she's decided  for palliative care. Plan for family meeting regarding permanent legal guardian.  Will touch base with oncology and continue to discuss.  Sepsis of unknown etiology On admission was febrile, tachycardic, tachypneic, with leukocytosis CXR 4/30 with low lung volumes UA bland, urine cx no growth Blood cx NGTD Procalcitonin 14.88-->5.96 --> 2.22 --> 0.14 Continue IV cefepime (pharmacy to renally dose), s/p 5 days of cefepime and flagyl given immunocompromised state, discontinue vancomycin with negative MRSA PCR S/P aggressive IV fluids (received about 6-7 L of IVF, BP stable) Monitor closely  Acute metabolic encephalopathy Underlying cognitive impairment Currently at baseline per oncology note from 5/3 Likely 2/2 above CT head showed no significant interval change in size and appearance of multiple hemorrhagic metastasis scattered throughout both cerebral hemispheres with similar localized vasogenic edema Needs to be without sitter/restraints x24 hrs  ??Possible secondary adrenal insufficiency ??Steroid withdrawal Patient was unable to keep any medications down, including steriods for the past few days PTA Resume home regimen - oncology recommending tapering steroids down to 10 mg daily over the next week  AKI History of tubulointerstitial nephritis Noted significant dehydration on admission <1, developed tubulointerstitial nephritis and was discharged with creatinine of 1.8 on steroids  Creatinine bumped to 2.72 on presentation Peaked at 3.14 Improving Low threshold for renal Continue steroids as above - tapering steroids as noted above (discussed with renal as well)  Hypokalemia  Hypomagnesemia Replace as needed Has been somewhat difficult to replete, continue to follow  Hypotension BP now stable s/p aggressive IV fluids, stress dose steroids Multifactorial, sepsis, dehydration, possible steroid withdrawal Continue stress dose steroids, tapering  Anemia of chronic  disease  Hemoglobin baseline around 10-11 Noted hemoglobin drop, likely hemodilution from aggressive IV fluids No evidence of any bleeding Daily CBC  Metastatic melanoma with extensive mets including brain S/p craniotomy and resection of left occipital and left frontal lesion in 1/22 Follows Dr. 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   Weight as of this encounter: 77.1 kg.   DVT prophylaxis: SCD Code Status: DNR Family Communication: none at bedside - wife over phone Disposition:   Status is: Inpatient  Remains inpatient appropriate because:Inpatient level of care appropriate due to severity of illness   Dispo: The patient is from: Home              Anticipated d/c is to: pending              Patient currently is not medically stable to d/c.   Difficult to place patient Yes       Consultants:   oncology  Procedures:  none  Antimicrobials:  Anti-infectives (From admission, onward)   Start     Dose/Rate Route Frequency Ordered Stop   01/16/21 1200  ceFEPIme (MAXIPIME) 2 g in sodium chloride 0.9 % 100 mL IVPB        2 g 200 mL/hr over 30 Minutes Intravenous Every 12 hours 01/16/21 1009 01/18/21 1155   01/15/21 2200  ceFEPIme (MAXIPIME) 2 g in sodium chloride 0.9 % 100 mL IVPB  Status:  Discontinued        2 g 200 mL/hr over 30 Minutes Intravenous Every 24 hours 01/15/21 0725 01/16/21 1009   01/15/21 1000  vancomycin (VANCOREADY) IVPB 1250 mg/250 mL  Status:  Discontinued        1,250 mg 166.7 mL/hr over 90 Minutes Intravenous Every 36 hours 01/14/21 0404 01/16/21 0714   01/14/21 1000  ceFEPIme (MAXIPIME) 2 g in sodium chloride 0.9 % 100 mL IVPB  Status:  Discontinued        2 g 200 mL/hr over 30 Minutes Intravenous Every 12 hours  01/14/21 0404 01/15/21 0725   01/14/21 0600  metroNIDAZOLE (FLAGYL) IVPB 500 mg  Status:  Discontinued        500 mg 100 mL/hr over 60 Minutes Intravenous Every 8 hours 01/14/21 0404 01/18/21 1503   01/13/21 2200  ceFEPIme (MAXIPIME) 2 g in sodium chloride 0.9 % 100 mL IVPB        2 g 200 mL/hr over 30 Minutes Intravenous  Once 01/13/21 2154 01/13/21 2322   01/13/21 2200  metroNIDAZOLE (FLAGYL) IVPB 500 mg        500 mg 100 mL/hr over 60 Minutes Intravenous  Once 01/13/21 2154 01/13/21 2322   01/13/21 2200  vancomycin (VANCOCIN) IVPB 1000 mg/200 mL premix  Status:  Discontinued        1,000 mg 200 mL/hr over 60 Minutes Intravenous  Once 01/13/21 2154 01/13/21 2155   01/13/21 2200  vancomycin (VANCOREADY) IVPB 1500 mg/300 mL        1,500 mg 150 mL/hr over 120 Minutes Intravenous  Once 01/13/21 2155 01/14/21 0122         Subjective: No new complaints  Objective: Vitals:   01/19/21 1256 01/19/21 2013 01/20/21 0500 01/20/21 1408  BP: 111/75 121/82 110/86 103/68  Pulse: 69 81 67 84  Resp:  16 16  20   Temp: 97.6 F (36.4 C) 97.8 F (36.6 C) 98 F (36.7 C) 98.1 F (36.7 C)  TempSrc: Oral Oral Oral Oral  SpO2: 100% 98% 100% 99%  Weight:      Height:        Intake/Output Summary (Last 24 hours) at 01/20/2021 1659 Last data filed at 01/20/2021 1236 Gross per 24 hour  Intake 1160 ml  Output 650 ml  Net 510 ml   Filed Weights   01/13/21 2212  Weight: 77.1 kg    Examination:  General: No acute distress. Cardiovascular: Heart sounds show Sumeet Geter regular rate, and rhythm.  Lungs: Clear to auscultation bilaterally  Abdomen: Soft, nontender, nondistended Neurological: Alert. Disoriented.  Occasionally nonsensical. Moves all extremities 4. Cranial nerves II through XII grossly intact. Skin: Warm and dry. No rashes or lesions. Extremities: No clubbing or cyanosis. No edema.    Data Reviewed: I have personally reviewed following labs and imaging studies  CBC: Recent Labs  Lab  01/16/21 0247 01/17/21 0450 01/18/21 0517 01/19/21 0429 01/20/21 0540  WBC 5.6 6.2 5.7 5.6 6.0  NEUTROABS 4.8 5.0 4.2 2.9 3.7  HGB 9.4* 10.4* 11.2* 11.0* 12.6*  HCT 28.6* 30.9* 32.8* 32.4* 36.7*  MCV 91.1 90.4 89.1 88.5 88.2  PLT 188 228 241 222 671    Basic Metabolic Panel: Recent Labs  Lab 01/17/21 0450 01/17/21 1554 01/18/21 0517 01/18/21 1831 01/19/21 0429 01/19/21 1847 01/20/21 1059  NA 145   < > 147* 142 140 142 135  K 2.8*   < > 3.1* 2.7* 2.9* 3.7 3.3*  CL 112*   < > 110 109 108 108 105  CO2 24   < > 28 22 21* 24 22  GLUCOSE 117*   < > 104* 96 79 155* 126*  BUN 27*   < > 20 17 13 13 13   CREATININE 1.66*   < > 1.07 0.89 0.79 0.91 0.80  CALCIUM 8.3*   < > 8.4* 8.0* 8.2* 8.6* 8.3*  MG 1.5*  --  1.6*  --   --   --   --   PHOS  --   --  3.2  --   --   --   --    < > = values in this interval not displayed.    GFR: Estimated Creatinine Clearance: 112.4 mL/min (by C-G formula based on SCr of 0.8 mg/dL).  Liver Function Tests: Recent Labs  Lab 01/16/21 0247 01/17/21 0450 01/18/21 0517 01/19/21 0429 01/20/21 1059  AST 36 31 36 44* 35  ALT 37 35 40 42 42  ALKPHOS 65 66 67 69 79  BILITOT 1.0 0.5 0.8 0.7 0.6  PROT 5.1* 5.5* 5.4* 5.1* 6.0*  ALBUMIN 2.5* 2.6* 2.7* 2.7* 3.1*    CBG: Recent Labs  Lab 01/19/21 1755 01/19/21 2016 01/20/21 0003 01/20/21 0542 01/20/21 1202  GLUCAP 186* 132* 108* 110* 138*     Recent Results (from the past 240 hour(s))  Resp Panel by RT-PCR (Flu Shravya Wickwire&B, Covid) Nasopharyngeal Swab     Status: None   Collection Time: 01/13/21  9:54 PM   Specimen: Nasopharyngeal Swab; Nasopharyngeal(NP) swabs in vial transport medium  Result Value Ref Range Status   SARS Coronavirus 2 by RT PCR NEGATIVE NEGATIVE Final    Comment: (NOTE) SARS-CoV-2 target nucleic acids are NOT DETECTED.  The SARS-CoV-2 RNA is generally detectable in upper respiratory specimens during the acute phase of infection. The lowest concentration of SARS-CoV-2 viral  copies this  assay can detect is 138 copies/mL. Lavona Norsworthy negative result does not preclude SARS-Cov-2 infection and should not be used as the sole basis for treatment or other patient management decisions. Ensley Blas negative result may occur with  improper specimen collection/handling, submission of specimen other than nasopharyngeal swab, presence of viral mutation(s) within the areas targeted by this assay, and inadequate number of viral copies(<138 copies/mL). Onofre Gains negative result must be combined with clinical observations, patient history, and epidemiological information. The expected result is Negative.  Fact Sheet for Patients:  BloggerCourse.com  Fact Sheet for Healthcare Providers:  SeriousBroker.it  This test is no t yet approved or cleared by the [place] FDA and  has been authorized for detection and/or diagnosis of SARS-CoV-2 by FDA under an Emergency Use Authorization (EUA). This EUA will remain  in effect (meaning this test can be used) for the duration of the COVID-19 declaration under Section 564(b)(1) of the Act, 21 U.S.C.section 360bbb-3(b)(1), unless the authorization is terminated  or revoked sooner.       Influenza Noha Milberger by PCR NEGATIVE NEGATIVE Final   Influenza B by PCR NEGATIVE NEGATIVE Final    Comment: (NOTE) The Xpert Xpress SARS-CoV-2/FLU/RSV plus assay is intended as an aid in the diagnosis of influenza from Nasopharyngeal swab specimens and should not be used as Asier Desroches sole basis for treatment. Nasal washings and aspirates are unacceptable for Xpert Xpress SARS-CoV-2/FLU/RSV testing.  Fact Sheet for Patients: BloggerCourse.com  Fact Sheet for Healthcare Providers: SeriousBroker.it  This test is not yet approved or cleared by the [place] FDA and has been authorized for detection and/or diagnosis of SARS-CoV-2 by FDA under an Emergency Use Authorization (EUA). This  EUA will remain in effect (meaning this test can be used) for the duration of the COVID-19 declaration under Section 564(b)(1) of the Act, 21 U.S.C. section 360bbb-3(b)(1), unless the authorization is terminated or revoked.  Performed at [facility], [address] [place], [place] [address]   Blood Culture (routine x 2)     Status: None   Collection Time: 01/13/21  9:54 PM   Specimen: BLOOD  Result Value Ref Range Status   Specimen Description   Final    BLOOD RIGHT ANTECUBITAL Performed at [facility], [address], [place], [place] [address]    Special Requests   Final    BOTTLES DRAWN AEROBIC ONLY Blood Culture results may not be optimal due to an inadequate volume of blood received in culture bottles Performed at [facility], [address], [place], [place] [address]    Culture   Final    NO GROWTH 5 DAYS Performed at [facility], [address], [place], [place] [address]    Report Status 01/19/2021 FINAL  Final  Urine culture     Status: None   Collection Time: 01/13/21  9:54 PM   Specimen: In/Out Cath Urine  Result Value Ref Range Status   Specimen Description   Final    IN/OUT CATH URINE Performed at [facility], [address], [place], [place] [address]    Special Requests   Final    NONE Performed at [facility], [address] [place], [place] [address]    Culture   Final    NO GROWTH Performed at [facility], [address], [place], [place] [address]    Report Status 01/15/2021 FINAL  Final  Blood Culture (routine x 2)     Status:  None   Collection Time: 01/13/21  9:59 PM   Specimen: BLOOD  Result Value Ref Range Status   Specimen Description   Final    BLOOD LEFT ANTECUBITAL Performed at [place] [address] [place], [place] [address]    Special Requests   Final    BOTTLES DRAWN AEROBIC AND ANAEROBIC  Blood Culture adequate volume Performed at [place] [address] [place], [place] [address]    Culture   Final    NO GROWTH 5 DAYS Performed at [facility], [place] [address] [place], [place] [address]    Report Status 01/19/2021 FINAL  Final  MRSA PCR Screening     Status: None   Collection Time: 01/15/21 11:45 AM   Specimen: Nasopharyngeal  Result Value Ref Range Status   MRSA by PCR NEGATIVE NEGATIVE Final    Comment:        The GeneXpert MRSA Assay (FDA approved for NASAL specimens only), is one component of Lenetta Piche comprehensive MRSA colonization surveillance program. It is not intended to diagnose MRSA infection nor to guide or monitor treatment for MRSA infections. Performed at [facility], [place] [address] [place], [place] [address]   C Difficile Quick Screen w PCR reflex     Status: None   Collection Time: 01/16/21 10:42 AM   Specimen: STOOL  Result Value Ref Range Status   C Diff antigen NEGATIVE NEGATIVE Final   C Diff toxin NEGATIVE NEGATIVE Final   C Diff interpretation No C. difficile detected.  Final    Comment: Performed at [facility], [place] [address] [place], [place] [address]         Radiology Studies: VAS [place] UPPER EXTREMITY VENOUS DUPLEX  Result Date: 01/20/2021 UPPER VENOUS STUDY  Patient Name:  ANGLE KAREL  Date of Exam:   01/20/2021 Medical Rec #: [id number]      Accession #:    [id number] Date of Birth: 1965-11-25     Patient Gender: M Patient Age:   54Y Exam Location:  [facility] Procedure:      VAS [place] UPPER EXTREMITY VENOUS DUPLEX Referring Phys: [id number] Laporsha Grealish CALDWELL POWELL JR --------------------------------------------------------------------------------  Indications: Swelling, and s/p IV Limitations: Poor ultrasound/tissue interface. Comparison Study: No prior studies. Performing Technologist: Darlin Coco RDMS,RVT  Examination Guidelines: Alroy Portela complete evaluation includes B-mode  imaging, spectral Doppler, color Doppler, and power Doppler as needed of all accessible portions of each vessel. Bilateral testing is considered an integral part of Jemarcus Dougal complete examination. Limited examinations for reoccurring indications may be performed as noted.  Right Findings: +----------+------------+---------+-----------+----------+-------+ RIGHT     CompressiblePhasicitySpontaneousPropertiesSummary +----------+------------+---------+-----------+----------+-------+ Subclavian               Yes       Yes                      +----------+------------+---------+-----------+----------+-------+  Left Findings: +----------+------------+---------+-----------+----------+-----------------+ LEFT      CompressiblePhasicitySpontaneousProperties     Summary      +----------+------------+---------+-----------+----------+-----------------+ IJV           Full       Yes       Yes                                +----------+------------+---------+-----------+----------+-----------------+ Subclavian    Full       Yes  Yes                                +----------+------------+---------+-----------+----------+-----------------+ Axillary      Full       Yes       Yes                                +----------+------------+---------+-----------+----------+-----------------+ Brachial      Full                                                    +----------+------------+---------+-----------+----------+-----------------+ Radial        Full                                                    +----------+------------+---------+-----------+----------+-----------------+ Ulnar         Full                                                    +----------+------------+---------+-----------+----------+-----------------+ Cephalic    Partial                                 Age Indeterminate +----------+------------+---------+-----------+----------+-----------------+  Basilic                                              Not visualized   +----------+------------+---------+-----------+----------+-----------------+  Summary:  Right: No evidence of thrombosis in the subclavian.  Left: No evidence of deep vein thrombosis in the upper extremity. Findings consistent with age indeterminate superficial vein thrombosis involving the left cephalic vein. Unable to visualize the basilic vein.  *See table(s) above for measurements and observations.    Preliminary         Scheduled Meds: . Chlorhexidine Gluconate Cloth  6 each Topical Daily  . citalopram  10 mg Oral QHS  . levETIRAcetam  500 mg Oral BID  . magnesium oxide  400 mg Oral Daily  . mouth rinse  15 mL Mouth Rinse BID  . pantoprazole  40 mg Oral Daily  . potassium chloride  40 mEq Oral Daily  . predniSONE  20 mg Oral Q breakfast  . senna-docusate  1 tablet Oral QHS   Continuous Infusions:    LOS: 6 days    Time spent: over 30 min    Lacretia Nicks, MD [facility]   To contact the attending provider between 7A-7P or the covering provider during after hours 7P-7A, please log into the web site [email or web address] and access using universal [place] password for that web site. If you do not have the password, please call the hospital operator.  01/20/2021, 4:59 PM

## 2021-01-20 NOTE — Progress Notes (Signed)
Upper extremity venous LT study completed.  Preliminary results relayed to Florene Glen, MD.   See CV Proc for preliminary results report.   Darlin Coco, RDMS, RVT

## 2021-01-20 NOTE — Clinical Social Work Note (Signed)
Clinical Social Work Assessment  Patient Details  Name: Sean [name] MRN: [id number] Date of Birth: 04/06/1966  Patient to return to [facility], once medically stable and ready for discharge.  Patient unable to discharge today, due to being placed in restraints this morning.  Patient must be out of restraints for 24 hours prior to being discharged. Voicemail message left for admissions coordinator at [facility].  FL-2 Form on chart and needs to be signed by MD.  Dr. Florene Glen notified of need for rapid COVID-19 Screening, signature on FL-2 Form and Discharge Summary dictated.  Per the request of [place], a Thor has been ordered for patient and will begin providing services once patient is discharged back to the facility.  Nat Christen, BSW, MSW, LCSW  Licensed Education officer, [facility]  Mailing [place] [address] [place], [place] [address] Physical Address-[address], [place], [place] [address] Toll Free Main # [phone] Fax # [phone] Cell # [phone]  [email or web address]

## 2021-01-20 NOTE — NC FL2 (Signed)
[place] LEVEL OF CARE SCREENING TOOL     IDENTIFICATION  Patient Name: Sean [name] Birthdate: December 25, 1965 Sex: male Admission Date (Current Location): 01/13/2021  [facility] and [place] Number:  Herbalist and Address:  [facility],  [address] [place], [place]      Provider Number: [id number]  Attending Physician Name and Address:  Elodia Florence., *  Relative Name and Phone Number:  Wife - Sean [name] - # [phone]    Current Level of Care: Hospital Recommended Level of Care: [place] Prior Approval Number:    Date Approved/Denied:   PASRR Number: [id number]  Discharge Plan: SNF    Current Diagnoses: Patient Active Problem List   Diagnosis Date Noted  . Sepsis ([place]) 01/14/2021  . 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 Malnutrition of moderate degree 12/21/2020  . Severe sepsis ([place]) 12/19/2020  . Pneumonia 12/18/2020  . Malignant melanoma ([place]) 10/26/2020  . Brain metastasis ([place]) 10/20/2020  . Melanoma of skin ([place]) 10/05/2020  . Status post craniotomy 09/29/2020  . Brain tumor ([place]) 09/29/2020  . Elevated blood pressure reading 09/15/2020  . Malignant melanoma metastatic to brain ([place]) 09/15/2020  . Uncomplicated alcohol dependence ([place])   . Seizure ([place])   . Hypokalemia   . Hyponatremia   . Elevated LFTs   . Brain metastases ([place]) 09/14/2020    Orientation RESPIRATION BLADDER Height & Weight     Self  Normal Incontinent Weight: 170 lb (77.1 kg) Height:  6' (182.9 cm)  BEHAVIORAL SYMPTOMS/MOOD NEUROLOGICAL BOWEL NUTRITION STATUS      Incontinent Diet (Regular)  AMBULATORY STATUS COMMUNICATION OF NEEDS Skin   Limited Assist Verbally                         Personal Care Assistance Level of Assistance  Bathing,Feeding,Dressing Bathing Assistance: Limited assistance Feeding assistance: Limited assistance Dressing Assistance: Limited assistance Total Care  Assistance: Limited assistance   Functional Limitations Info  Sight,Hearing,Speech Sight Info: Adequate Hearing Info: Adequate Speech Info: Adequate    SPECIAL CARE FACTORS FREQUENCY  PT (By licensed PT),OT (By licensed OT)     PT Frequency: Minimum 5 X's a week OT Frequency: Minimum 5 X's per week            Contractures Contractures Info: Not present    Additional Factors Info  Code Status,Allergies,Psychotropic Code Status Info: DNR Allergies Info: None Psychotropic Info: Celexa tab. 10 MG, PO, Daily; Methylphenidate tab. 5 MG, PO, Daily         Current Medications (01/20/2021):  This is the current hospital active medication list Current Facility-Administered Medications  Medication Dose Route Frequency Provider Last Rate Last Admin  . acetaminophen (TYLENOL) tablet 650 mg  650 mg Oral Q6H PRN Rise Patience, MD       Or  . acetaminophen (TYLENOL) suppository 650 mg  650 mg Rectal Q6H PRN Rise Patience, MD      . Chlorhexidine Gluconate Cloth 2 % PADS 6 each  6 each Topical Daily Rise Patience, MD   6 each at 01/20/21 1045  . citalopram (CELEXA) tablet 10 mg  10 mg Oral QHS Alma Friendly, MD   10 mg at 01/19/21 2210  . dextrose 5 % and 0.45 % NaCl with KCl 30 mEq/L infusion   Intravenous Continuous Elodia Florence., MD 75 mL/hr at 01/20/21 [phone] Restarted at 01/20/21 0842  . levETIRAcetam (KEPPRA) 100  MG/ML solution 500 mg  500 mg Oral BID Polly Cobia, RPH   500 mg at 01/20/21 1040  . magnesium oxide (MAG-OX) tablet 400 mg  400 mg Oral Daily Elodia Florence., MD   400 mg at 01/20/21 1040  . MEDLINE mouth rinse  15 mL Mouth Rinse BID Rise Patience, MD   15 mL at 01/20/21 1045  . melatonin tablet 5 mg  5 mg Oral QHS PRN Alma Friendly, MD   5 mg at 01/18/21 2031  . pantoprazole (PROTONIX) EC tablet 40 mg  40 mg Oral Daily Alma Friendly, MD   40 mg at 01/20/21 1040  . predniSONE (DELTASONE) tablet 20 mg  20 mg  Oral Q breakfast [name], MD   20 mg at 01/20/21 0837  . senna-docusate (Senokot-S) tablet 1 tablet  1 tablet Oral QHS Alma Friendly, MD   1 tablet at 01/19/21 2210     Discharge Medications: Please see discharge summary for a list of discharge medications.  Relevant Imaging Results:  Relevant Lab Results:   Additional Information SSN:  [id number]  Neelam Tiggs, Marta Lamas, LCSW

## 2021-01-20 NOTE — Progress Notes (Signed)
Patient must be out of restraints for 24 hours prior to being discharged. Voicemail message left for admissions coordinator at [facility]  FL-2 Form on chart and needs to be signed by MD.  Dr. Florene Glen notified of need for rapid COVID-19 Screening, signature on FL-2 Form and Discharge Summary dictated.  Per the request of [place], a [facility] has been ordered for patient and will begin providing services once patient is discharged back to the facility.  Possible discharge on 01/21/2021.

## 2021-01-20 NOTE — Progress Notes (Signed)
1859- Patient tried to get up. Grabbed safety sitter's arm, then proceeded to push her. As patient pushed sitter, he fell onto the floor landing on his buttocks. RN went into room immediately. VS obtained. No injuries visible. When asked about the event, patient replied told staff it was not a big deal. Dr. Lorenda Cahill. Post fall flowsheet completed. Denise-spouse- called. She was understanding and appreciative of staff for their care.  Order for restraints and ativan placed by dr Florene Glen.

## 2021-01-21 DIAGNOSIS — A419 Sepsis, unspecified organism: Secondary | ICD-10-CM | POA: Diagnosis not present

## 2021-01-21 LAB — CBC WITH DIFFERENTIAL/PLATELET
Abs Immature Granulocytes: 0.12 10*3/uL — ABNORMAL HIGH (ref 0.00–0.07)
Basophils Absolute: 0.1 10*3/uL (ref 0.0–0.1)
Basophils Relative: 1 %
Eosinophils Absolute: 0.2 10*3/uL (ref 0.0–0.5)
Eosinophils Relative: 3 %
HCT: 33.2 % — ABNORMAL LOW (ref 39.0–52.0)
Hemoglobin: 11 g/dL — ABNORMAL LOW (ref 13.0–17.0)
Immature Granulocytes: 2 %
Lymphocytes Relative: 22 %
Lymphs Abs: 1.4 10*3/uL (ref 0.7–4.0)
MCH: 30.1 pg (ref 26.0–34.0)
MCHC: 33.1 g/dL (ref 30.0–36.0)
MCV: 90.7 fL (ref 80.0–100.0)
Monocytes Absolute: 0.6 10*3/uL (ref 0.1–1.0)
Monocytes Relative: 9 %
Neutro Abs: 4.1 10*3/uL (ref 1.7–7.7)
Neutrophils Relative %: 63 %
Platelets: 241 10*3/uL (ref 150–400)
RBC: 3.66 MIL/uL — ABNORMAL LOW (ref 4.22–5.81)
RDW: 12.5 % (ref 11.5–15.5)
WBC: 6.4 10*3/uL (ref 4.0–10.5)
nRBC: 0 % (ref 0.0–0.2)

## 2021-01-21 LAB — COMPREHENSIVE METABOLIC PANEL
ALT: 35 U/L (ref 0–44)
AST: 25 U/L (ref 15–41)
Albumin: 2.7 g/dL — ABNORMAL LOW (ref 3.5–5.0)
Alkaline Phosphatase: 66 U/L (ref 38–126)
Anion gap: 8 (ref 5–15)
BUN: 11 mg/dL (ref 6–20)
CO2: 22 mmol/L (ref 22–32)
Calcium: 8.5 mg/dL — ABNORMAL LOW (ref 8.9–10.3)
Chloride: 110 mmol/L (ref 98–111)
Creatinine, Ser: 0.8 mg/dL (ref 0.61–1.24)
GFR, Estimated: >60 mL/min (ref >60)
Glucose, Bld: 78 mg/dL (ref 70–99)
Potassium: 3.4 mmol/L — ABNORMAL LOW (ref 3.5–5.1)
Sodium: 140 mmol/L (ref 135–145)
Total Bilirubin: 0.5 mg/dL (ref 0.3–1.2)
Total Protein: 5.3 g/dL — ABNORMAL LOW (ref 6.5–8.1)

## 2021-01-21 LAB — GLUCOSE, CAPILLARY
Glucose-Capillary: 81 mg/dL (ref 70–99)
Glucose-Capillary: 70 mg/dL (ref 70–99)

## 2021-01-21 LAB — PHOSPHORUS: Phosphorus: 2.6 mg/dL (ref 2.5–4.6)

## 2021-01-21 LAB — MAGNESIUM: Magnesium: 1.4 mg/dL — ABNORMAL LOW (ref 1.7–2.4)

## 2021-01-21 MED ORDER — QUETIAPINE FUMARATE 25 MG PO TABS
12.5000 mg | ORAL_TABLET | Freq: Two times a day (BID) | ORAL | Status: DC
  Administered 2021-01-21 (×2): 12.5 mg via ORAL
  Filled 2021-01-21 (×2): qty 1

## 2021-01-21 MED ORDER — MAGNESIUM SULFATE 2 GM/50ML IV SOLN
2.0000 g | Freq: Once | INTRAVENOUS | Status: AC
  Administered 2021-01-21: 2 g via INTRAVENOUS
  Filled 2021-01-21: qty 50

## 2021-01-21 NOTE — Progress Notes (Signed)
Patient was without a sitter today, but was not able to discontinue to restraints. Patient continued to ask for scissors in order to cut himself loose. Patient had first dose of seroquel this afternoon. Unable to obtain covid swab per patient restlessness.  53- Patient wife noticed L arm swelling. Patient did have infiltrated IV yesterday. MD was notified and ultrasound performed negative for DVT. Arm is currently being elevated on pillow. Patient wife was educated and made aware.

## 2021-01-21 NOTE — Progress Notes (Addendum)
PROGRESS NOTE    [name]  [id number] DOB: 1965-10-18 DOA: 01/13/2021 PCP: [name], MD    Chief Complaint  Patient presents with  . 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 Patient's wife states that he was at his baseline mental status 1 day PTA, when she checked on him at the facility. Last few days patient has been having persistent nausea, vomiting unable to keep his medications. Has been doing poorly last few days. 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 In the ER patient was hypotensive, tachycardic and febrile with temperature of 103 F, chest x-ray unremarkable UA unremarkable CT head shows hemorrhagic metastasis. COVID test is negative. Labs show potassium of 2.7, creatinine of 2.7, WBC of 17, lactic acid to be normal. Patient was empirically started on antibiotics with unknown source. Admitted for further work-up of sepsis.   Assessment & Plan:   Principal Problem:   Sepsis ([place]) Active Problems:   Brain metastases ([place])   Seizure ([place])   Hypokalemia   Acute tubulointerstitial nephritis  Goals of care Therapy now recommending custodial care.  Difficult situation.  Apparently [place] [name] has been appointed legal guardian, is interested in continuing treatment - reportedly contentious relationship between wife and family, family embraces hospice while wife in favor of further treatment - per SW note from today.  At this time, will plan to discharge to SNF when he's stable (needs to be without sitter or restraints for 24 hrs, maybe difficult - will check EKG for qtc,  consider seroquel).  Sounds like [place] will only take him back with palliative or hospice referral, she's decided for palliative care. Plan for family meeting regarding permanent legal guardian.  Will touch base with oncology and continue to discuss.  Sepsis of unknown etiology On admission was febrile, tachycardic, tachypneic, with leukocytosis CXR 4/30 with low lung volumes UA bland, urine cx no growth Blood cx NGTD Procalcitonin 14.88-->5.96 --> 2.22 --> 0.14 Continue IV cefepime (pharmacy to renally dose), s/p 5 days of cefepime and flagyl given immunocompromised state, discontinue vancomycin with negative MRSA PCR S/P aggressive IV fluids (received about 6-7 L of IVF, BP stable) Monitor closely  Acute metabolic encephalopathy Underlying cognitive impairment Currently at baseline per oncology note from 5/3 Likely 2/2 above CT head showed no significant interval change in size and appearance of multiple hemorrhagic metastasis scattered throughout both cerebral hemispheres with similar localized vasogenic edema See note from RN5/7 PM, pushed safety sitter and fell, requiring restraints/continued sitter for his and staff safety Needs to be without sitter/restraints x24 hrs As above, EKG for qtc, consider seroquel 478 4/30 - will start and follow   Superficial Vein Thrombosis  Left Upper Extremity Swelling [place] without DVT, age indeterminate superficial vein thrombosis involving the L cephalic vein  ??Possible secondary adrenal insufficiency ??Steroid withdrawal Patient was unable to keep any medications down, including steriods for the past few days PTA Resume home regimen - oncology recommending tapering steroids down to 10 mg daily over the next week  AKI History of tubulointerstitial nephritis Noted significant dehydration on admission <1, developed tubulointerstitial nephritis and was discharged with creatinine of 1.8 on steroids  Creatinine bumped to 2.72 on  presentation Peaked at 3.14 Improving Low threshold for renal Continue steroids as above - tapering steroids as noted above (discussed with renal as well)  Hypokalemia  Hypomagnesemia Replace as needed Has been somewhat difficult to replete, continue to follow  Hypotension BP now stable s/p aggressive IV fluids, stress dose steroids Multifactorial, sepsis, dehydration, possible steroid withdrawal Continue stress dose steroids, tapering  Anemia of chronic disease Hemoglobin baseline around 10-11 Noted hemoglobin drop, likely hemodilution from aggressive IV fluids No evidence of any bleeding Daily CBC  Metastatic melanoma with extensive mets including brain S/p craniotomy and resection of left occipital and left frontal lesion in 1/22 Follows Dr. 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   Weight as of this encounter: 77.1 kg.   DVT prophylaxis: SCD Code Status: DNR Family Communication: none at bedside - wife over phone 5/7 Disposition:   Status is: Inpatient  Remains inpatient appropriate because:Inpatient level of care appropriate due to severity of illness   Dispo: The patient is from: Home              Anticipated d/c is to: pending              Patient currently is not medically stable to d/c.   Difficult to place patient Yes       Consultants:   oncology  Procedures:  none  Antimicrobials:  Anti-infectives (From admission, onward)   Start     Dose/Rate Route Frequency Ordered Stop   01/16/21 1200  ceFEPIme (MAXIPIME) 2 g in sodium chloride 0.9 % 100 mL IVPB        2 g 200 mL/hr over 30 Minutes Intravenous Every 12 hours 01/16/21 1009 01/18/21 1155   01/15/21 2200  ceFEPIme (MAXIPIME) 2 g in sodium chloride 0.9 % 100  mL IVPB  Status:  Discontinued        2 g 200 mL/hr over 30 Minutes Intravenous Every 24 hours 01/15/21 0725 01/16/21 1009   01/15/21 1000  vancomycin (VANCOREADY) IVPB 1250 mg/250 mL  Status:  Discontinued        1,250 mg 166.7 mL/hr over 90 Minutes Intravenous Every 36 hours 01/14/21 0404 01/16/21 0714   01/14/21 1000  ceFEPIme (MAXIPIME) 2 g in sodium chloride 0.9 % 100 mL IVPB  Status:  Discontinued        2 g 200 mL/hr over 30 Minutes Intravenous Every 12 hours 01/14/21 0404 01/15/21 0725   01/14/21 0600  metroNIDAZOLE (FLAGYL) IVPB 500 mg  Status:  Discontinued        500 mg 100 mL/hr over 60 Minutes Intravenous Every 8 hours 01/14/21 0404 01/18/21 1503   01/13/21 2200  ceFEPIme (MAXIPIME) 2 g in sodium chloride 0.9 % 100 mL IVPB        2 g 200 mL/hr over 30 Minutes Intravenous  Once 01/13/21 2154 01/13/21 2322   01/13/21 2200  metroNIDAZOLE (FLAGYL) IVPB 500 mg        500 mg 100 mL/hr over 60 Minutes Intravenous  Once 01/13/21 2154 01/13/21 2322   01/13/21 2200  vancomycin (VANCOCIN) IVPB 1000 mg/200 mL premix  Status:  Discontinued        1,000 mg 200 mL/hr over 60 Minutes Intravenous  Once  01/13/21 2154 01/13/21 2155   01/13/21 2200  vancomycin (VANCOREADY) IVPB 1500 mg/300 mL        1,500 mg 150 mL/hr over 120 Minutes Intravenous  Once 01/13/21 2155 01/14/21 0122         Subjective: No new complaints, pleasantly confused  Objective: Vitals:   01/20/21 1408 01/20/21 1853 01/20/21 1920 01/21/21 0539  BP: 103/68 107/82 113/67 109/77  Pulse: 84 (!) 108 88 92  Resp: 20 18 15 15   Temp: 98.1 F (36.7 C) 98.3 F (36.8 C) 98.2 F (36.8 C) 98.4 F (36.9 C)  TempSrc: Oral Oral Oral Oral  SpO2: 99% 100% 98% 97%  Weight:      Height:        Intake/Output Summary (Last 24 hours) at 01/21/2021 0843 Last data filed at 01/21/2021 0546 Gross per 24 hour  Intake 495 ml  Output 850 ml  Net -355 ml   Filed Weights   01/13/21 2212  Weight: 77.1 kg     Examination:  General: No acute distress. Cardiovascular: RRR Lungs: unlabored Abdomen: Soft, nontender, nondistended Neurological: Alert, but disoriented, pleasantly confused - follows commands. Moves all extremities 4 . Cranial nerves II through XII grossly intact. Skin: Warm and dry. No rashes or lesions. Extremities: No clubbing or cyanosis. No edema. In restraints - posey/ wrist     Data Reviewed: I have personally reviewed following labs and imaging studies  CBC: Recent Labs  Lab 01/17/21 0450 01/18/21 0517 01/19/21 0429 01/20/21 0540 01/21/21 0601  WBC 6.2 5.7 5.6 6.0 6.4  NEUTROABS 5.0 4.2 2.9 3.7 4.1  HGB 10.4* 11.2* 11.0* 12.6* 11.0*  HCT 30.9* 32.8* 32.4* 36.7* 33.2*  MCV 90.4 89.1 88.5 88.2 90.7  PLT 228 241 222 255 650    Basic Metabolic Panel: Recent Labs  Lab 01/17/21 0450 01/17/21 1554 01/18/21 0517 01/18/21 1831 01/19/21 0429 01/19/21 1847 01/20/21 1059 01/21/21 0601  NA 145   < > 147* 142 140 142 135 140  K 2.8*   < > 3.1* 2.7* 2.9* 3.7 3.3* 3.4*  CL 112*   < > 110 109 108 108 105 110  CO2 24   < > 28 22 21* 24 22 22   GLUCOSE 117*   < > 104* 96 79 155* 126* 78  BUN 27*   < > 20 17 13 13 13 11   CREATININE 1.66*   < > 1.07 0.89 0.79 0.91 0.80 0.80  CALCIUM 8.3*   < > 8.4* 8.0* 8.2* 8.6* 8.3* 8.5*  MG 1.5*  --  1.6*  --   --   --   --  1.4*  PHOS  --   --  3.2  --   --   --   --  2.6   < > = values in this interval not displayed.    GFR: Estimated Creatinine Clearance: 112.4 mL/min (by C-G formula based on SCr of 0.8 mg/dL).  Liver Function Tests: Recent Labs  Lab 01/17/21 0450 01/18/21 0517 01/19/21 0429 01/20/21 1059 01/21/21 0601  AST 31 36 44* 35 25  ALT 35 40 42 42 35  ALKPHOS 66 67 69 79 66  BILITOT 0.5 0.8 0.7 0.6 0.5  PROT 5.5* 5.4* 5.1* 6.0* 5.3*  ALBUMIN 2.6* 2.7* 2.7* 3.1* 2.7*    CBG: Recent Labs  Lab 01/20/21 0003 01/20/21 0542 01/20/21 1202 01/20/21 1737 01/21/21 0540  GLUCAP 108* 110* 138* 130* 81      Recent Results (from the past 240 hour(s))  Resp Panel by RT-PCR (Flu Donnelle Rubey&B, Covid) Nasopharyngeal Swab     Status: None   Collection Time: 01/13/21  9:54 PM   Specimen: Nasopharyngeal Swab; Nasopharyngeal(NP) swabs in vial transport medium  Result Value Ref Range Status   SARS Coronavirus 2 by RT PCR NEGATIVE NEGATIVE Final    Comment: (NOTE) SARS-CoV-2 target nucleic acids are NOT DETECTED.  The SARS-CoV-2 RNA is generally detectable in upper respiratory specimens during the acute phase of infection. The lowest concentration of SARS-CoV-2 viral copies this assay can detect is 138 copies/mL. Houston Zapien negative result does not preclude SARS-Cov-2 infection and should not be used as the sole basis for treatment or other patient management decisions. Jansel Vonstein negative result may occur with  improper specimen collection/handling, submission of specimen other than nasopharyngeal swab, presence of viral mutation(s) within the areas targeted by this assay, and inadequate number of viral copies(<138 copies/mL). Wilene Pharo negative result must be combined with clinical observations, patient history, and epidemiological information. The expected result is Negative.  Fact Sheet for Patients:  EntrepreneurPulse.com.au  Fact Sheet for Healthcare Providers:  IncredibleEmployment.be  This test is no t yet approved or cleared by the [place] FDA and  has been authorized for detection and/or diagnosis of SARS-CoV-2 by FDA under an Emergency Use Authorization (EUA). This EUA will remain  in effect (meaning this test can be used) for the duration of the COVID-19 declaration under Section 564(b)(1) of the Act, 21 U.S.C.section 360bbb-3(b)(1), unless the authorization is terminated  or revoked sooner.       Influenza Enslee Bibbins by PCR NEGATIVE NEGATIVE Final   Influenza B by PCR NEGATIVE NEGATIVE Final    Comment: (NOTE) The Xpert Xpress SARS-CoV-2/FLU/RSV plus assay is intended as  an aid in the diagnosis of influenza from Nasopharyngeal swab specimens and should not be used as Tashunda Vandezande sole basis for treatment. Nasal washings and aspirates are unacceptable for Xpert Xpress SARS-CoV-2/FLU/RSV testing.  Fact Sheet for Patients: EntrepreneurPulse.com.au  Fact Sheet for Healthcare Providers: IncredibleEmployment.be  This test is not yet approved or cleared by the [place] FDA and has been authorized for detection and/or diagnosis of SARS-CoV-2 by FDA under an Emergency Use Authorization (EUA). This EUA will remain in effect (meaning this test can be used) for the duration of the COVID-19 declaration under Section 564(b)(1) of the Act, 21 U.S.C. section 360bbb-3(b)(1), unless the authorization is terminated or revoked.  Performed at [facility], [place] [address] [place], [place] [address]   Blood Culture (routine x 2)     Status: None   Collection Time: 01/13/21  9:54 PM   Specimen: BLOOD  Result Value Ref Range Status   Specimen Description   Final    BLOOD RIGHT ANTECUBITAL Performed at [place] [address] [place], [place] [address]    Special Requests   Final    BOTTLES DRAWN AEROBIC ONLY Blood Culture results may not be optimal due to an inadequate volume of blood received in culture bottles Performed at [place] [address] [place], [place] [address]    Culture   Final    NO GROWTH 5 DAYS Performed at [facility], [place] [address], [place], [place] [address]    Report Status 01/19/2021 FINAL  Final  Urine culture     Status: None   Collection Time: 01/13/21  9:54 PM   Specimen: In/Out Cath Urine  Result Value Ref Range Status   Specimen Description   Final  IN/OUT CATH URINE Performed at [facility], [place] [address], [place], [place] [address]    Special Requests   Final    NONE Performed at [facility], [place] [address] [place], [place] [address]    Culture   Final    NO GROWTH Performed at [facility], [place] [address] [place], [place] [address]    Report Status 01/15/2021 FINAL  Final  Blood Culture (routine x 2)     Status: None   Collection Time: 01/13/21  9:59 PM   Specimen: BLOOD  Result Value Ref Range Status   Specimen Description   Final    BLOOD LEFT ANTECUBITAL Performed at [place] [address], [place], [place] [address]    Special Requests   Final    BOTTLES DRAWN AEROBIC AND ANAEROBIC Blood Culture adequate volume Performed at [place] [address] [place], [place] [address]    Culture   Final    NO GROWTH 5 DAYS Performed at [facility], [place] [address], [place], [place] [address]    Report Status 01/19/2021 FINAL  Final  MRSA PCR Screening     Status: None   Collection Time: 01/15/21 11:45 AM   Specimen: Nasopharyngeal  Result Value Ref Range Status   MRSA by PCR NEGATIVE NEGATIVE Final    Comment:        The GeneXpert MRSA Assay (FDA approved for NASAL specimens only), is one component of Nasiyah Laverdiere comprehensive MRSA colonization surveillance program. It is not intended to diagnose MRSA infection nor to guide or monitor treatment for MRSA infections. Performed at [facility], [place] [address] [place], [place] [address]   C Difficile Quick Screen w PCR reflex     Status: None   Collection Time: 01/16/21 10:42 AM   Specimen: STOOL  Result Value Ref Range Status   C Diff antigen NEGATIVE NEGATIVE Final   C Diff toxin NEGATIVE NEGATIVE Final   C Diff interpretation No C. difficile detected.  Final    Comment: Performed at [facility], [place] [address], [place],  [address]         Radiology Studies: VAS [place] UPPER EXTREMITY VENOUS DUPLEX  Result Date: 01/20/2021 UPPER VENOUS STUDY  Patient Name:  BRAYDEN BRODHEAD  Date of Exam:    01/20/2021 Medical Rec #: [id number]      Accession #:    [id number] Date of Birth: December 15, 1965     Patient Gender: M Patient Age:   54Y Exam Location:  [facility] Procedure:      VAS [place] UPPER EXTREMITY VENOUS DUPLEX Referring Phys: [id number] Loise Esguerra CALDWELL POWELL JR --------------------------------------------------------------------------------  Indications: Swelling, and s/p IV Limitations: Poor ultrasound/tissue interface. Comparison Study: No prior studies. Performing Technologist: Darlin Coco RDMS,RVT  Examination Guidelines: Ahnyla Mendel complete evaluation includes B-mode imaging, spectral Doppler, color Doppler, and power Doppler as needed of all accessible portions of each vessel. Bilateral testing is considered an integral part of Alic Hilburn complete examination. Limited examinations for reoccurring indications may be performed as noted.  Right Findings: +----------+------------+---------+-----------+----------+-------+ RIGHT     CompressiblePhasicitySpontaneousPropertiesSummary +----------+------------+---------+-----------+----------+-------+ Subclavian               Yes       Yes                      +----------+------------+---------+-----------+----------+-------+  Left Findings: +----------+------------+---------+-----------+----------+-----------------+ LEFT      CompressiblePhasicitySpontaneousProperties  Summary      +----------+------------+---------+-----------+----------+-----------------+ IJV           Full       Yes       Yes                                +----------+------------+---------+-----------+----------+-----------------+ Subclavian    Full       Yes       Yes                                +----------+------------+---------+-----------+----------+-----------------+ Axillary      Full       Yes       Yes                                +----------+------------+---------+-----------+----------+-----------------+ Brachial      Full                                                     +----------+------------+---------+-----------+----------+-----------------+ Radial        Full                                                    +----------+------------+---------+-----------+----------+-----------------+ Ulnar         Full                                                    +----------+------------+---------+-----------+----------+-----------------+ Cephalic    Partial                                 Age Indeterminate +----------+------------+---------+-----------+----------+-----------------+ Basilic                                              Not visualized   +----------+------------+---------+-----------+----------+-----------------+  Summary:  Right: No evidence of thrombosis in the subclavian.  Left: No evidence of deep vein thrombosis in the upper extremity. Findings consistent with age indeterminate superficial vein thrombosis involving the left cephalic vein. Unable to visualize the basilic vein.  *See table(s) above for measurements and observations.    Preliminary         Scheduled Meds: . Chlorhexidine Gluconate Cloth  6 each Topical Daily  . citalopram  10 mg Oral QHS  . levETIRAcetam  500 mg Oral BID  . magnesium oxide  400 mg Oral Daily  . mouth rinse  15 mL Mouth Rinse BID  . pantoprazole  40 mg Oral Daily  . potassium chloride  40 mEq Oral Daily  . predniSONE  20 mg Oral Q breakfast  . senna-docusate  1 tablet Oral QHS  . traZODone  25 mg Oral QHS   Continuous Infusions: . magnesium sulfate  bolus IVPB       LOS: 7 days    Time spent: over 30 min    Fayrene Helper, MD [facility]   To contact the attending provider between 7A-7P or the covering provider during after hours 7P-7A, please log into the web site [email or web address] and access using universal [place] password for that web site. If you do not have the password, please call the hospital operator.  01/21/2021, 8:43 AM

## 2021-01-22 ENCOUNTER — Inpatient Hospital Stay (HOSPITAL_COMMUNITY): Payer: 59

## 2021-01-22 ENCOUNTER — Telehealth: Payer: Self-pay | Admitting: *Deleted

## 2021-01-22 ENCOUNTER — Telehealth: Payer: Self-pay | Admitting: Oncology

## 2021-01-22 DIAGNOSIS — A419 Sepsis, unspecified organism: Secondary | ICD-10-CM | POA: Diagnosis not present

## 2021-01-22 LAB — CBC WITH DIFFERENTIAL/PLATELET
Abs Immature Granulocytes: 0.14 10*3/uL — ABNORMAL HIGH (ref 0.00–0.07)
Basophils Absolute: 0 10*3/uL (ref 0.0–0.1)
Basophils Relative: 0 %
Eosinophils Absolute: 0.1 10*3/uL (ref 0.0–0.5)
Eosinophils Relative: 1 %
HCT: 35.4 % — ABNORMAL LOW (ref 39.0–52.0)
Hemoglobin: 12.1 g/dL — ABNORMAL LOW (ref 13.0–17.0)
Immature Granulocytes: 2 %
Lymphocytes Relative: 10 %
Lymphs Abs: 0.7 10*3/uL (ref 0.7–4.0)
MCH: 30.6 pg (ref 26.0–34.0)
MCHC: 34.2 g/dL (ref 30.0–36.0)
MCV: 89.4 fL (ref 80.0–100.0)
Monocytes Absolute: 0.7 10*3/uL (ref 0.1–1.0)
Monocytes Relative: 10 %
Neutro Abs: 5.6 10*3/uL (ref 1.7–7.7)
Neutrophils Relative %: 77 %
Platelets: 249 10*3/uL (ref 150–400)
RBC: 3.96 MIL/uL — ABNORMAL LOW (ref 4.22–5.81)
RDW: 12.7 % (ref 11.5–15.5)
WBC: 7.2 10*3/uL (ref 4.0–10.5)
nRBC: 0 % (ref 0.0–0.2)

## 2021-01-22 LAB — COMPREHENSIVE METABOLIC PANEL
ALT: 38 U/L (ref 0–44)
AST: 30 U/L (ref 15–41)
Albumin: 2.9 g/dL — ABNORMAL LOW (ref 3.5–5.0)
Alkaline Phosphatase: 74 U/L (ref 38–126)
Anion gap: 7 (ref 5–15)
BUN: 12 mg/dL (ref 6–20)
CO2: 25 mmol/L (ref 22–32)
Calcium: 8.5 mg/dL — ABNORMAL LOW (ref 8.9–10.3)
Chloride: 107 mmol/L (ref 98–111)
Creatinine, Ser: 1.02 mg/dL (ref 0.61–1.24)
GFR, Estimated: >60 mL/min (ref >60)
Glucose, Bld: 81 mg/dL (ref 70–99)
Potassium: 4.1 mmol/L (ref 3.5–5.1)
Sodium: 139 mmol/L (ref 135–145)
Total Bilirubin: 0.8 mg/dL (ref 0.3–1.2)
Total Protein: 5.6 g/dL — ABNORMAL LOW (ref 6.5–8.1)

## 2021-01-22 LAB — MAGNESIUM: Magnesium: 1.9 mg/dL (ref 1.7–2.4)

## 2021-01-22 LAB — GLUCOSE, CAPILLARY
Glucose-Capillary: 105 mg/dL — ABNORMAL HIGH (ref 70–99)
Glucose-Capillary: 82 mg/dL (ref 70–99)
Glucose-Capillary: 96 mg/dL (ref 70–99)
Glucose-Capillary: 98 mg/dL (ref 70–99)

## 2021-01-22 LAB — PHOSPHORUS: Phosphorus: 3.5 mg/dL (ref 2.5–4.6)

## 2021-01-22 IMAGING — DX DG CHEST 1V PORT
1 series · 1 of 1 positions shown · non-contrast
Comparison: [DATE] chest radiograph.

CLINICAL DATA: Inpatient, concern for aspiration, confusion

EXAM:
PORTABLE CHEST 1 VIEW

[chest ap]
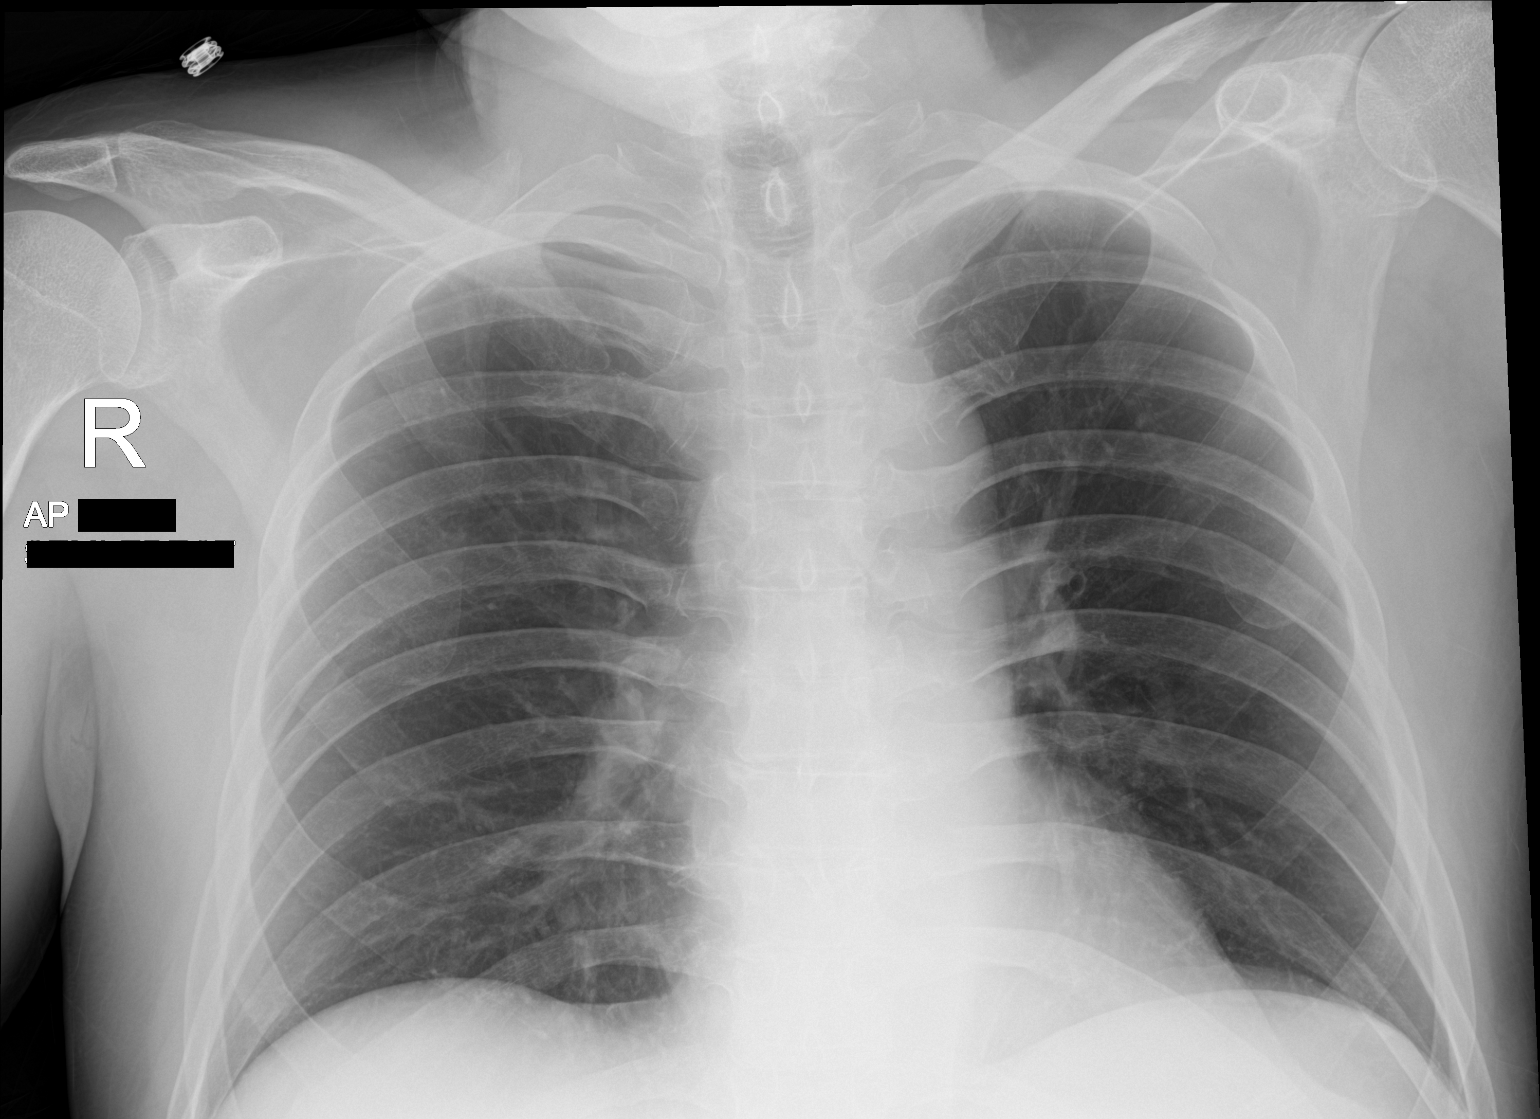

[1 of 1 positions shown; findings below may reference images not displayed]

FINDINGS: Stable cardiomediastinal silhouette with normal heart size. No
pneumothorax. No pleural effusion. Lungs appear clear, with no acute
consolidative airspace disease and no pulmonary edema.
IMPRESSION: No active disease.

## 2021-01-22 MED ORDER — PREDNISONE 10 MG PO TABS
10.0000 mg | ORAL_TABLET | Freq: Every day | ORAL | Status: DC
  Administered 2021-01-23 – 2021-01-29 (×7): 10 mg via ORAL
  Filled 2021-01-22 (×7): qty 1

## 2021-01-22 MED ORDER — QUETIAPINE FUMARATE 25 MG PO TABS
25.0000 mg | ORAL_TABLET | Freq: Two times a day (BID) | ORAL | Status: DC
  Administered 2021-01-22 – 2021-01-24 (×6): 25 mg via ORAL
  Filled 2021-01-22 (×6): qty 1

## 2021-01-22 NOTE — Progress Notes (Addendum)
PROGRESS NOTE    Sean [name]  UUV:[id number] DOB: [birth year] DOA: 01/13/2021 PCP: Ladene Artist, MD    Chief Complaint  Patient presents with  . 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 Patient's wife states that he was at his baseline mental status 1 day PTA, when she checked on him at the facility. Last few days patient has been having persistent nausea, vomiting unable to keep his medications. Has been doing poorly last few days. 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 In the ER patient was hypotensive, tachycardic and febrile with temperature of 103 F, chest x-ray unremarkable UA unremarkable CT head shows hemorrhagic metastasis. COVID test is negative. Labs show potassium of 2.7, creatinine of 2.7, WBC of 17, lactic acid to be normal. Patient was empirically started on antibiotics with unknown source. Admitted for further work-up of sepsis.   Assessment & Plan:   Principal Problem:   Sepsis (HCC) Active Problems:   Brain metastases (HCC)   Seizure (HCC)   Hypokalemia   Acute tubulointerstitial nephritis  Goals of care Therapy now recommending custodial care.  Difficult situation.  Apparently Angelique Blonder has been appointed legal guardian, is interested in continuing treatment - reportedly contentious relationship between wife and family, family embraces hospice while wife in favor of further treatment - per SW note from today.  At this time, will plan to discharge to SNF when he's stable (needs to be without sitter or restraints for 24 hrs, maybe difficult - will check EKG for qtc,  consider seroquel).  Sounds like [place] will only take him back with palliative or hospice referral, she's decided for palliative care.  Acute metabolic encephalopathy Underlying cognitive impairment Currently at baseline per oncology note from 5/3 Likely 2/2 above CT head showed no significant interval change in size and appearance of multiple hemorrhagic metastasis scattered throughout both cerebral hemispheres with similar localized vasogenic edema See note from RN 5/7 PM, pushed safety sitter and fell, requiring restraints/continued sitter for his and staff safety Needs to be without sitter/restraints x24 hrs As above, EKG for qtc, consider seroquel 440 5/9 - will start and follow - uptitrate as tolerated  Aspiration Event Noted this morning, follow CXR and SLP eval  Sepsis of unknown etiology On admission was febrile, tachycardic, tachypneic, with leukocytosis CXR 4/30 with low lung volumes UA bland, urine cx no growth Blood cx NGTD Procalcitonin 14.88-->5.96 --> 2.22 --> 0.14 Continue IV cefepime (pharmacy to renally dose), s/p 5 days of cefepime and flagyl given immunocompromised state, discontinue vancomycin with negative MRSA PCR S/P aggressive IV fluids (received about 6-7 L of IVF, BP stable) Monitor closely  Superficial Vein Thrombosis  Left Upper Extremity Swelling [place] without DVT, age indeterminate superficial vein thrombosis involving the L cephalic vein  ??Possible secondary adrenal insufficiency ??Steroid withdrawal Patient was unable to keep any medications down, including steriods for the past few days PTA Resume home regimen - oncology recommending tapering steroids down to 10 mg daily over the next week  AKI History of tubulointerstitial nephritis Noted significant dehydration on admission <1, developed tubulointerstitial nephritis and was discharged with creatinine of 1.8 on steroids  Creatinine bumped  to 2.72 on presentation Peaked at  3.14 Improving Low threshold for renal Continue steroids as above - tapering steroids as noted above (discussed with renal as well)  Hypokalemia  Hypomagnesemia Replace as needed  Hypotension BP now stable s/p aggressive IV fluids, stress dose steroids Multifactorial, sepsis, dehydration, possible steroid withdrawal Continue stress dose steroids, tapering  Anemia of chronic disease Hemoglobin baseline around 10-11 Noted hemoglobin drop, likely hemodilution from aggressive IV fluids No evidence of any bleeding Daily CBC  Metastatic melanoma with extensive mets including brain S/p craniotomy and resection of left occipital and left frontal lesion in 1/22 Follows Dr. 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   Weight as of this encounter: 77.1 kg.   DVT prophylaxis: SCD Code Status: DNR Family Communication: none at bedside - called wife, discussed 5/9 Disposition:   Status is: Inpatient  Remains inpatient appropriate because:Inpatient level of care appropriate due to severity of illness   Dispo: The patient is from: Home              Anticipated d/c is to: pending              Patient currently is not medically stable to d/c.   Difficult to place patient Yes       Consultants:   oncology  Procedures:  none  Antimicrobials:  Anti-infectives (From admission, onward)   Start     Dose/Rate Route Frequency Ordered Stop   01/16/21 1200  ceFEPIme (MAXIPIME) 2 g in sodium chloride 0.9 % 100 mL IVPB        2 g 200 mL/hr over 30 Minutes Intravenous Every 12 hours 01/16/21 1009 01/18/21 1155   01/15/21 2200  ceFEPIme (MAXIPIME) 2 g in sodium chloride 0.9 % 100 mL IVPB  Status:  Discontinued        2 g 200 mL/hr over 30 Minutes  Intravenous Every 24 hours 01/15/21 0725 01/16/21 1009   01/15/21 1000  vancomycin (VANCOREADY) IVPB 1250 mg/250 mL  Status:  Discontinued        1,250 mg 166.7 mL/hr over 90 Minutes Intravenous Every 36 hours 01/14/21 0404 01/16/21 0714   01/14/21 1000  ceFEPIme (MAXIPIME) 2 g in sodium chloride 0.9 % 100 mL IVPB  Status:  Discontinued        2 g 200 mL/hr over 30 Minutes Intravenous Every 12 hours 01/14/21 0404 01/15/21 0725   01/14/21 0600  metroNIDAZOLE (FLAGYL) IVPB 500 mg  Status:  Discontinued        500 mg 100 mL/hr over 60 Minutes Intravenous Every 8 hours 01/14/21 0404 01/18/21 1503   01/13/21 2200  ceFEPIme (MAXIPIME) 2 g in sodium chloride 0.9 % 100 mL IVPB        2 g 200 mL/hr over 30 Minutes Intravenous  Once 01/13/21 2154 01/13/21 2322   01/13/21 2200  metroNIDAZOLE (FLAGYL) IVPB 500 mg        500 mg 100 mL/hr over 60 Minutes Intravenous  Once 01/13/21 2154 01/13/21 2322   01/13/21 2200  vancomycin (VANCOCIN) IVPB 1000 mg/200 mL premix  Status:  Discontinued        1,000 mg 200 mL/hr over 60 Minutes Intravenous  Once 01/13/21 2154 01/13/21 2155   01/13/21 2200  vancomycin (VANCOREADY)  IVPB 1500 mg/300 mL        1,500 mg 150 mL/hr over 120 Minutes Intravenous  Once 01/13/21 2155 01/14/21 0122         Subjective: No new complaints Pleasantly confused  Objective: Vitals:   01/21/21 0539 01/21/21 1401 01/21/21 2007 01/22/21 0422  BP: 109/77 104/69 114/71 109/73  Pulse: 92 93 83 79  Resp: 15 17 16 16   Temp: 98.4 F (36.9 C) (!) 97.1 F (36.2 C) 98.4 F (36.9 C) 98.4 F (36.9 C)  TempSrc: Oral  Oral Oral  SpO2: 97% 100% 100% 99%  Weight:      Height:        Intake/Output Summary (Last 24 hours) at 01/22/2021 1153 Last data filed at 01/21/2021 1857 Gross per 24 hour  Intake 0 ml  Output --  Net 0 ml   Filed Weights   01/13/21 2212  Weight: 77.1 kg    Examination:  General: No acute distress. Cardiovascular: Heart sounds show Jeanann Balinski regular rate, and  rhythm. Lungs: Clear to auscultation bilaterally  Abdomen: Soft, nontender, nondistended Neurological: Alert and disoriented. Moves all extremities 4. Cranial nerves II through XII grossly intact. Skin: Warm and dry. No rashes or lesions. Extremities: No clubbing or cyanosis. No edema.     Data Reviewed: I have personally reviewed following labs and imaging studies  CBC: Recent Labs  Lab 01/18/21 0517 01/19/21 0429 01/20/21 0540 01/21/21 0601 01/22/21 0458  WBC 5.7 5.6 6.0 6.4 7.2  NEUTROABS 4.2 2.9 3.7 4.1 5.6  HGB 11.2* 11.0* 12.6* 11.0* 12.1*  HCT 32.8* 32.4* 36.7* 33.2* 35.4*  MCV 89.1 88.5 88.2 90.7 89.4  PLT 241 222 255 241 379    Basic Metabolic Panel: Recent Labs  Lab 01/17/21 0450 01/17/21 1554 01/18/21 0517 01/18/21 1831 01/19/21 0429 01/19/21 1847 01/20/21 1059 01/21/21 0601 01/22/21 0458  NA 145   < > 147*   < > 140 142 135 140 139  K 2.8*   < > 3.1*   < > 2.9* 3.7 3.3* 3.4* 4.1  CL 112*   < > 110   < > 108 108 105 110 107  CO2 24   < > 28   < > 21* 24 22 22 25   GLUCOSE 117*   < > 104*   < > 79 155* 126* 78 81  BUN 27*   < > 20   < > 13 13 13 11 12   CREATININE 1.66*   < > 1.07   < > 0.79 0.91 0.80 0.80 1.02  CALCIUM 8.3*   < > 8.4*   < > 8.2* 8.6* 8.3* 8.5* 8.5*  MG 1.5*  --  1.6*  --   --   --   --  1.4* 1.9  PHOS  --   --  3.2  --   --   --   --  2.6 3.5   < > = values in this interval not displayed.    GFR: Estimated Creatinine Clearance: 88.2 mL/min (by C-G formula based on SCr of 1.02 mg/dL).  Liver Function Tests: Recent Labs  Lab 01/18/21 0517 01/19/21 0429 01/20/21 1059 01/21/21 0601 01/22/21 0458  AST 36 44* 35 25 30  ALT 40 42 42 35 38  ALKPHOS 67 69 79 66 74  BILITOT 0.8 0.7 0.6 0.5 0.8  PROT 5.4* 5.1* 6.0* 5.3* 5.6*  ALBUMIN 2.7* 2.7* 3.1* 2.7* 2.9*    CBG: Recent Labs  Lab 01/21/21 0540 01/21/21 1152 01/22/21 0007  01/22/21 0421 01/22/21 1136  GLUCAP 81 70 98 82 105*     Recent Results (from the past 240  hour(s))  Resp Panel by RT-PCR (Flu Lucita Montoya&B, Covid) Nasopharyngeal Swab     Status: None   Collection Time: 01/13/21  9:54 PM   Specimen: Nasopharyngeal Swab; Nasopharyngeal(NP) swabs in vial transport medium  Result Value Ref Range Status   SARS Coronavirus 2 by RT PCR NEGATIVE NEGATIVE Final    Comment: (NOTE) SARS-CoV-2 target nucleic acids are NOT DETECTED.  The SARS-CoV-2 RNA is generally detectable in upper respiratory specimens during the acute phase of infection. The lowest concentration of SARS-CoV-2 viral copies this assay can detect is 138 copies/mL. Aoi Kouns negative result does not preclude SARS-Cov-2 infection and should not be used as the sole basis for treatment or other patient management decisions. Ephraim Reichel negative result may occur with  improper specimen collection/handling, submission of specimen other than nasopharyngeal swab, presence of viral mutation(s) within the areas targeted by this assay, and inadequate number of viral copies(<138 copies/mL). Isao Seltzer negative result must be combined with clinical observations, patient history, and epidemiological information. The expected result is Negative.  Fact Sheet for Patients:  EntrepreneurPulse.com.au  Fact Sheet for Healthcare Providers:  IncredibleEmployment.be  This test is no t yet approved or cleared by the [place] FDA and  has been authorized for detection and/or diagnosis of SARS-CoV-2 by FDA under an Emergency Use Authorization (EUA). This EUA will remain  in effect (meaning this test can be used) for the duration of the COVID-19 declaration under Section 564(b)(1) of the Act, 21 U.S.C.section 360bbb-3(b)(1), unless the authorization is terminated  or revoked sooner.       Influenza Jaicee Michelotti by PCR NEGATIVE NEGATIVE Final   Influenza B by PCR NEGATIVE NEGATIVE Final    Comment: (NOTE) The Xpert Xpress SARS-CoV-2/FLU/RSV plus assay is intended as an aid in the diagnosis of influenza from  Nasopharyngeal swab specimens and should not be used as Harvest Deist sole basis for treatment. Nasal washings and aspirates are unacceptable for Xpert Xpress SARS-CoV-2/FLU/RSV testing.  Fact Sheet for Patients: EntrepreneurPulse.com.au  Fact Sheet for Healthcare Providers: IncredibleEmployment.be  This test is not yet approved or cleared by the [place] FDA and has been authorized for detection and/or diagnosis of SARS-CoV-2 by FDA under an Emergency Use Authorization (EUA). This EUA will remain in effect (meaning this test can be used) for the duration of the COVID-19 declaration under Section 564(b)(1) of the Act, 21 U.S.C. section 360bbb-3(b)(1), unless the authorization is terminated or revoked.  Performed at [facility], [place] [address] [place], [place] [address]   Blood Culture (routine x 2)     Status: None   Collection Time: 01/13/21  9:54 PM   Specimen: BLOOD  Result Value Ref Range Status   Specimen Description   Final    BLOOD RIGHT ANTECUBITAL Performed at [place] [address], [place], [place] [address]    Special Requests   Final    BOTTLES DRAWN AEROBIC ONLY Blood Culture results may not be optimal due to an inadequate volume of blood received in culture bottles Performed at [place] [address] [place], [place] [address]    Culture   Final    NO GROWTH 5 DAYS Performed at [facility], [place] [address] [place],  [address]    Report Status 01/19/2021 FINAL  Final  Urine culture     Status: None   Collection Time:  01/13/21  9:54 PM   Specimen: In/Out Cath Urine  Result Value Ref Range Status   Specimen Description   Final    IN/OUT CATH URINE Performed at [facility], [place] [address], [place], [place] [address]    Special Requests   Final    NONE Performed at [facility], [place] [address],  [place], [place] [address]    Culture   Final    NO GROWTH Performed at [facility], [place] [address] [place], [place] [address]    Report Status 01/15/2021 FINAL  Final  Blood Culture (routine x 2)     Status: None   Collection Time: 01/13/21  9:59 PM   Specimen: BLOOD  Result Value Ref Range Status   Specimen Description   Final    BLOOD LEFT ANTECUBITAL Performed at [place] [address], [place], [place] [address]    Special Requests   Final    BOTTLES DRAWN AEROBIC AND ANAEROBIC Blood Culture adequate volume Performed at [place] [address] [place], [place] [address]    Culture   Final    NO GROWTH 5 DAYS Performed at [facility], [place] [address] [place], [place] [address]    Report Status 01/19/2021 FINAL  Final  MRSA PCR Screening     Status: None   Collection Time: 01/15/21 11:45 AM   Specimen: Nasopharyngeal  Result Value Ref Range Status   MRSA by PCR NEGATIVE NEGATIVE Final    Comment:        The GeneXpert MRSA Assay (FDA approved for NASAL specimens only), is one component of Alby Schwabe comprehensive MRSA colonization surveillance program. It is not intended to diagnose MRSA infection nor to guide or monitor treatment for MRSA infections. Performed at [facility], [place] [address], [place], [place] [address]   C Difficile Quick Screen w PCR reflex     Status: None   Collection Time: 01/16/21 10:42 AM   Specimen: STOOL  Result Value Ref Range Status   C Diff antigen NEGATIVE NEGATIVE Final   C Diff toxin NEGATIVE NEGATIVE Final   C Diff interpretation No C. difficile detected.  Final    Comment: Performed at [facility], [place] [address] [place], [place] [address]         Radiology Studies: DG CHEST PORT 1 VIEW  Result Date: 01/22/2021 CLINICAL DATA:  Inpatient, concern for aspiration, confusion EXAM: PORTABLE CHEST 1 VIEW COMPARISON:  01/13/2021 chest  radiograph. FINDINGS: Stable cardiomediastinal silhouette with normal heart size. No pneumothorax. No pleural effusion. Lungs appear clear, with no acute consolidative airspace disease and no pulmonary edema. IMPRESSION: No active disease. Electronically Signed   By: Ilona Sorrel M.D.   On: 01/22/2021 11:07   VAS [place] UPPER EXTREMITY VENOUS DUPLEX  Result Date: 01/21/2021 UPPER VENOUS STUDY  Patient Name:  DEONTA BOMBERGER  Date of Exam:   01/20/2021 Medical Rec #: [id number]      Accession #:    [id number] Date of Birth: [birth year]     Patient Gender: M Patient Age:   54Y Exam Location:  [facility] Procedure:      VAS [place] UPPER EXTREMITY VENOUS DUPLEX Referring Phys: [id number] Sophiya Morello CALDWELL POWELL JR --------------------------------------------------------------------------------  Indications: Swelling, and s/p IV Limitations: Poor ultrasound/tissue interface. Comparison Study: No prior studies. Performing Technologist: Darlin Coco RDMS,RVT  Examination Guidelines: Safiyya Stokes complete evaluation includes B-mode imaging, spectral Doppler, color Doppler, and  power Doppler as needed of all accessible portions of each vessel. Bilateral testing is considered an integral part of Quay Simkin complete examination. Limited examinations for reoccurring indications may be performed as noted.  Right Findings: +----------+------------+---------+-----------+----------+-------+ RIGHT     CompressiblePhasicitySpontaneousPropertiesSummary +----------+------------+---------+-----------+----------+-------+ Subclavian               Yes       Yes                      +----------+------------+---------+-----------+----------+-------+  Left Findings: +----------+------------+---------+-----------+----------+-----------------+ LEFT      CompressiblePhasicitySpontaneousProperties     Summary      +----------+------------+---------+-----------+----------+-----------------+ IJV           Full       Yes       Yes                                 +----------+------------+---------+-----------+----------+-----------------+ Subclavian    Full       Yes       Yes                                +----------+------------+---------+-----------+----------+-----------------+ Axillary      Full       Yes       Yes                                +----------+------------+---------+-----------+----------+-----------------+ Brachial      Full                                                    +----------+------------+---------+-----------+----------+-----------------+ Radial        Full                                                    +----------+------------+---------+-----------+----------+-----------------+ Ulnar         Full                                                    +----------+------------+---------+-----------+----------+-----------------+ Cephalic    Partial                                 Age Indeterminate +----------+------------+---------+-----------+----------+-----------------+ Basilic                                              Not visualized   +----------+------------+---------+-----------+----------+-----------------+  Summary:  Right: No evidence of thrombosis in the subclavian.  Left: No evidence of deep vein thrombosis in the upper extremity. Findings consistent with age indeterminate superficial vein thrombosis involving the left cephalic vein. Unable to visualize the basilic vein.  *See table(s) above for measurements and observations.  Diagnosing physician: Leonette Mostharles  Fields MD Electronically signed by Ruta Hinds MD on 01/21/2021 at 4:17:44 PM.    Final         Scheduled Meds: . Chlorhexidine Gluconate Cloth  6 each Topical Daily  . citalopram  10 mg Oral QHS  . levETIRAcetam  500 mg Oral BID  . magnesium oxide  400 mg Oral Daily  . mouth rinse  15 mL Mouth Rinse BID  . pantoprazole  40 mg Oral Daily  . potassium chloride  40 mEq Oral Daily  . predniSONE  20 mg Oral Q  breakfast  . QUEtiapine  25 mg Oral BID  . senna-docusate  1 tablet Oral QHS  . traZODone  25 mg Oral QHS   Continuous Infusions:    LOS: 8 days    Time spent: over 30 min    Fayrene Helper, MD [facility]   To contact the attending provider between 7A-7P or the covering provider during after hours 7P-7A, please log into the web site [email or web address] and access using universal [place] password for that web site. If you do not have the password, please call the hospital operator.  01/22/2021, 11:53 AM

## 2021-01-22 NOTE — Telephone Encounter (Signed)
Called and spoke with wife regarding appointments added per 5/6 sch msg. She has requested to speak with nurse concerning her husband. IN basket message was sent ot [facility] [place]

## 2021-01-22 NOTE — Telephone Encounter (Signed)
Notified wife that forms for [facility] have been completed and ready for her to pick up. She will come in today to get these.

## 2021-01-22 NOTE — Evaluation (Signed)
Clinical/Bedside Swallow Evaluation Patient Details  Name: Sean [name] MRN: [id number] Date of Birth: 06-29-1966  Today's Date: 01/22/2021 Time: SLP Start Time (ACUTE ONLY): 52 SLP Stop Time (ACUTE ONLY): 1120 SLP Time Calculation (min) (ACUTE ONLY): 15 min  Past Medical History:  Past Medical History:  Diagnosis Date  . Brain lesion 09/14/2020  . 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 Hyperlipidemia   . Liver lesion 09/14/2020   Past Surgical History:  Past Surgical History:  Procedure Laterality Date  . APPLICATION OF CRANIAL NAVIGATION  09/29/2020   Procedure: APPLICATION OF CRANIAL NAVIGATION;  Surgeon: Judith Part, MD;  Location: [place];  Service: Neurosurgery;;  . Kyla Balzarine Left 09/29/2020   Procedure: Left craniotomy for tumor resection with brainlab;  Surgeon: Judith Part, MD;  Location: [facility];  Service: Neurosurgery;  Laterality: Left;  . HERNIA REPAIR     left side inguinal hernia about 18-20 years   HPI:  Patient is a 55 y.o. 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 Per patient's wife, he had been having nausea and vomitting past few days and had been unable to keep his medications down.  In ER, he was hypotensive and tachycardic, febrile with temperature at 103 F. CXR unremarkable, UA unremarkable and CT head shows hemorrhagic metastasis. Per RN report, patient was not consistently swallowing, was holding medications in mouth at times and holding food; he then had vomitting episode which contained whole pieces of food that he apparently had not fully masticated.   Assessment / Plan / Recommendation Clinical Impression  Patient presents with what appears to be a moderate cognitive-based dysphagia but with pharyngeal phase of swallow appearing WFL. Patient exhibits significantly impaired cognition  with attention and awareness severely impacted. As per RN report and observed by this SLP when patient masticating cracker, he exhibits poor overall attention to and awareness of food boluses leading to delays and at times, inadequate mastication and oral transit as well as intermittent holding of solid and/or liquid boluses in oral cavity which likely is leading to premature spillage of bolus into pharynx. Thin liquid toleration appeared WFL. SLP is recommending to downgrade solid food consistencies to Dys 2 and continue with thin liquids. SLP will follow for toleration and determination of ability to advance solid textures, however progress anticipated to be fair secondary to likely continued cognitive decline. SLP Visit Diagnosis: Dysphagia, oral phase (R13.11)    Aspiration Risk  Mild aspiration risk    Diet Recommendation Dysphagia 2 (Fine chop);Thin liquid   Liquid Administration via: Cup;Straw Medication Administration: Crushed with puree Supervision: Full supervision/cueing for compensatory strategies Compensations: Minimize environmental distractions;Lingual sweep for clearance of pocketing Postural Changes: Seated upright at 90 degrees    Other  Recommendations Oral Care Recommendations: Oral care BID;Staff/trained caregiver to provide oral care;Other (Comment) (oral care after PO's)   Follow up Recommendations        Frequency and Duration min 1 x/week  1 week       Prognosis Prognosis for Safe Diet Advancement: Fair Barriers to Reach Goals: Cognitive deficits Barriers/Prognosis Comment: Patient's cogntive deficits likely to continue to worsen as cancer mets to brain progresses      Swallow Study   General Date of Onset: 01/21/21 HPI: Patient is a 55 y.o. 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 Per patient's wife, he had been  having nausea and vomitting past few days and had been unable to keep his medications down.  In ER, he was hypotensive and tachycardic, febrile with temperature at 103 F. CXR unremarkable, UA unremarkable and CT head shows hemorrhagic metastasis. Per RN report, patient was not consistently swallowing, was holding medications in mouth at times and holding food; he then had vomitting episode which contained whole pieces of food that he apparently had not fully masticated. Type of Study: Bedside Swallow Evaluation Previous Swallow Assessment: during previous admission Diet Prior to this Study: Regular;Thin liquids Temperature Spikes Noted: No Respiratory Status: Room air History of Recent Intubation: No Behavior/Cognition: Alert;Requires cueing;Confused;Distractible Oral Cavity Assessment: Within Functional Limits Oral Care Completed by SLP: Recent completion by staff Oral Cavity - Dentition: Adequate natural dentition Vision: Functional for self-feeding Self-Feeding Abilities: Able to feed self Patient Positioning: Upright in bed Baseline Vocal Quality: Normal Volitional Cough: Cognitively unable to elicit Volitional Swallow: Unable to elicit    Oral/Motor/Sensory Function Overall Oral Motor/Sensory Function: Within functional limits   Ice Chips     Thin Liquid Thin Liquid: Within functional limits Presentation: Straw;Self Fed    Nectar Thick     Honey Thick     Puree Puree: Within functional limits Presentation: Self Fed   Solid     Solid: Impaired Oral Phase Impairments: Poor awareness of bolus Other Comments: mild delay of mastication and oral clearance of PO crackers     Sonia Baller, MA, CCC-SLP Speech Therapy

## 2021-01-23 DIAGNOSIS — A419 Sepsis, unspecified organism: Secondary | ICD-10-CM | POA: Diagnosis not present

## 2021-01-23 LAB — CBC WITH DIFFERENTIAL/PLATELET
Abs Immature Granulocytes: 0.13 10*3/uL — ABNORMAL HIGH (ref 0.00–0.07)
Basophils Absolute: 0 10*3/uL (ref 0.0–0.1)
Basophils Relative: 1 %
Eosinophils Absolute: 0.1 10*3/uL (ref 0.0–0.5)
Eosinophils Relative: 2 %
HCT: 35.7 % — ABNORMAL LOW (ref 39.0–52.0)
Hemoglobin: 11.8 g/dL — ABNORMAL LOW (ref 13.0–17.0)
Immature Granulocytes: 2 %
Lymphocytes Relative: 15 %
Lymphs Abs: 1 10*3/uL (ref 0.7–4.0)
MCH: 30.4 pg (ref 26.0–34.0)
MCHC: 33.1 g/dL (ref 30.0–36.0)
MCV: 92 fL (ref 80.0–100.0)
Monocytes Absolute: 0.8 10*3/uL (ref 0.1–1.0)
Monocytes Relative: 12 %
Neutro Abs: 4.8 10*3/uL (ref 1.7–7.7)
Neutrophils Relative %: 68 %
Platelets: 236 10*3/uL (ref 150–400)
RBC: 3.88 MIL/uL — ABNORMAL LOW (ref 4.22–5.81)
RDW: 12.8 % (ref 11.5–15.5)
WBC: 6.9 10*3/uL (ref 4.0–10.5)
nRBC: 0 % (ref 0.0–0.2)

## 2021-01-23 LAB — GLUCOSE, CAPILLARY
Glucose-Capillary: 102 mg/dL — ABNORMAL HIGH (ref 70–99)
Glucose-Capillary: 102 mg/dL — ABNORMAL HIGH (ref 70–99)
Glucose-Capillary: 110 mg/dL — ABNORMAL HIGH (ref 70–99)
Glucose-Capillary: 73 mg/dL (ref 70–99)
Glucose-Capillary: 75 mg/dL (ref 70–99)
Glucose-Capillary: 98 mg/dL (ref 70–99)

## 2021-01-23 LAB — COMPREHENSIVE METABOLIC PANEL
ALT: 32 U/L (ref 0–44)
AST: 21 U/L (ref 15–41)
Albumin: 3.1 g/dL — ABNORMAL LOW (ref 3.5–5.0)
Alkaline Phosphatase: 74 U/L (ref 38–126)
Anion gap: 8 (ref 5–15)
BUN: 14 mg/dL (ref 6–20)
CO2: 24 mmol/L (ref 22–32)
Calcium: 8.8 mg/dL — ABNORMAL LOW (ref 8.9–10.3)
Chloride: 107 mmol/L (ref 98–111)
Creatinine, Ser: 1.32 mg/dL — ABNORMAL HIGH (ref 0.61–1.24)
GFR, Estimated: >60 mL/min (ref >60)
Glucose, Bld: 81 mg/dL (ref 70–99)
Potassium: 3.9 mmol/L (ref 3.5–5.1)
Sodium: 139 mmol/L (ref 135–145)
Total Bilirubin: 0.8 mg/dL (ref 0.3–1.2)
Total Protein: 5.5 g/dL — ABNORMAL LOW (ref 6.5–8.1)

## 2021-01-23 LAB — PHOSPHORUS: Phosphorus: 3.6 mg/dL (ref 2.5–4.6)

## 2021-01-23 LAB — MAGNESIUM: Magnesium: 1.7 mg/dL (ref 1.7–2.4)

## 2021-01-23 NOTE — Progress Notes (Signed)
PROGRESS NOTE    Sean [name]  WJX:[id number] DOB: 06-29-1966 DOA: 01/13/2021 PCP: [name], MD    Chief Complaint  Patient presents with  . 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 Patient's wife states that he was at his baseline mental status 1 day PTA, when she checked on him at the facility. Last few days patient has been having persistent nausea, vomiting unable to keep his medications. Has been doing poorly last few days. 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 In the ER patient was hypotensive, tachycardic and febrile with temperature of 103 F, chest x-ray unremarkable UA unremarkable CT head shows hemorrhagic metastasis. COVID test is negative. Labs show potassium of 2.7, creatinine of 2.7, WBC of 17, lactic acid to be normal. Patient was empirically started on antibiotics with unknown source. Admitted for further work-up of sepsis.  He's now improved on antibiotics.  Discharge pending ability to d/c sitter/restraints in setting of his impulsivity.  Plan is for discharge to SNF and to continue treatment for his metastatic melanoma.  Assessment & Plan:   Principal Problem:   Sepsis ([place]) Active Problems:   Brain metastases ([place])   Seizure ([place])   Hypokalemia   Acute tubulointerstitial nephritis  Goals of care Therapy now recommending custodial care.  Difficult situation.  Apparently [name] has been appointed legal guardian, is interested in continuing treatment - reportedly contentious relationship between wife and family, family embraces hospice while wife in favor of further  treatment - per SW note from today.  At this time, will plan to discharge to SNF when he's stable (needs to be without sitter or restraints for 24 hrs, maybe difficult - will check EKG for qtc, consider seroquel).  Sounds like [place] will only take him back with palliative or hospice referral, she's decided for palliative care.  Acute metabolic encephalopathy Underlying cognitive impairment Currently at baseline per oncology note from 5/3 Likely 2/2 above CT head showed no significant interval change in size and appearance of multiple hemorrhagic metastasis scattered throughout both cerebral hemispheres with similar localized vasogenic edema See note from RN 5/7 PM, pushed safety sitter and fell, requiring restraints/continued sitter for his and staff safety Needs to be without sitter/restraints x24 hrs As above, EKG for qtc, consider seroquel 440 5/9 - will start and follow - uptitrate as tolerated  Aspiration Event CXR negative Dysphagia 2 diet per SLP Follow closely  Sepsis of unknown etiology On admission was febrile, tachycardic, tachypneic, with leukocytosis CXR 4/30 with low lung volumes UA bland, urine cx no growth Blood cx NGTD Procalcitonin 14.88-->5.96 --> 2.22 --> 0.14 Continue IV cefepime (pharmacy to renally dose), s/p 5 days of cefepime and flagyl given immunocompromised state, discontinue vancomycin with negative MRSA PCR S/P aggressive IV fluids (received about 6-7 L of IVF, BP stable) Monitor closely  Superficial Vein Thrombosis  Left Upper Extremity Swelling [place] without DVT, age indeterminate superficial vein thrombosis involving the L cephalic vein  ??Possible secondary adrenal insufficiency ??Steroid withdrawal Patient was unable to keep any medications down, including steriods for the past few days PTA Resume home regimen - oncology recommending tapering steroids down to 10  mg daily over the next week  AKI History of tubulointerstitial  nephritis Noted significant dehydration on admission <1, developed tubulointerstitial nephritis and was discharged with creatinine of 1.8 on steroids  Creatinine bumped to 2.72 on presentation Peaked at 3.14 Creatinine starting to rise, follow closely with tapering of steroids Low threshold for renal Continue steroids as above - tapering steroids as noted above (discussed with renal as well)  Hypokalemia  Hypomagnesemia Replace as needed  Hypotension BP now stable s/p aggressive IV fluids, stress dose steroids Multifactorial, sepsis, dehydration, possible steroid withdrawal Continue stress dose steroids, tapering  Anemia of chronic disease Hemoglobin baseline around 10-11 Noted hemoglobin drop, likely hemodilution from aggressive IV fluids No evidence of any bleeding Daily CBC  Metastatic melanoma with extensive mets including brain S/p craniotomy and resection of left occipital and left frontal lesion in 1/22 Follows Dr. 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   Weight as of this encounter: 77.1 kg.   DVT prophylaxis: SCD Code Status: DNR Family Communication: none at bedside - called wife, discussed 5/9 Disposition:   Status is: Inpatient  Remains inpatient appropriate because:Inpatient level of care appropriate due to severity of illness   Dispo: The patient is from: Home              Anticipated d/c is to: pending              Patient currently is not medically stable to d/c.   Difficult to place patient Yes       Consultants:   oncology  Procedures:  none  Antimicrobials:  Anti-infectives (From admission, onward)   Start     Dose/Rate Route Frequency Ordered Stop   01/16/21 1200  ceFEPIme (MAXIPIME) 2 g in sodium  chloride 0.9 % 100 mL IVPB        2 g 200 mL/hr over 30 Minutes Intravenous Every 12 hours 01/16/21 1009 01/18/21 1155   01/15/21 2200  ceFEPIme (MAXIPIME) 2 g in sodium chloride 0.9 % 100 mL IVPB  Status:  Discontinued        2 g 200 mL/hr over 30 Minutes Intravenous Every 24 hours 01/15/21 0725 01/16/21 1009   01/15/21 1000  vancomycin (VANCOREADY) IVPB 1250 mg/250 mL  Status:  Discontinued        1,250 mg 166.7 mL/hr over 90 Minutes Intravenous Every 36 hours 01/14/21 0404 01/16/21 0714   01/14/21 1000  ceFEPIme (MAXIPIME) 2 g in sodium chloride 0.9 % 100 mL IVPB  Status:  Discontinued        2 g 200 mL/hr over 30 Minutes Intravenous Every 12 hours 01/14/21 0404 01/15/21 0725   01/14/21 0600  metroNIDAZOLE (FLAGYL) IVPB 500 mg  Status:  Discontinued        500 mg 100 mL/hr over 60 Minutes Intravenous Every 8 hours 01/14/21 0404 01/18/21 1503   01/13/21 2200  ceFEPIme (MAXIPIME) 2 g in sodium chloride 0.9 % 100 mL IVPB        2 g 200 mL/hr over 30 Minutes Intravenous  Once 01/13/21 2154 01/13/21 2322   01/13/21 2200  metroNIDAZOLE (FLAGYL) IVPB 500 mg        500 mg 100 mL/hr over 60 Minutes Intravenous  Once 01/13/21 2154 01/13/21 2322  01/13/21 2200  vancomycin (VANCOCIN) IVPB 1000 mg/200 mL premix  Status:  Discontinued        1,000 mg 200 mL/hr over 60 Minutes Intravenous  Once 01/13/21 2154 01/13/21 2155   01/13/21 2200  vancomycin (VANCOREADY) IVPB 1500 mg/300 mL        1,500 mg 150 mL/hr over 120 Minutes Intravenous  Once 01/13/21 2155 01/14/21 0122         Subjective: Pleasantly confused, no new complaints  Objective: Vitals:   01/22/21 2113 01/23/21 0608 01/23/21 1422 01/23/21 1425  BP: 112/70 102/73 94/61 (!) 100/54  Pulse: 80 97 99 99  Resp: 16 16 16 16   Temp: (!) 97.4 F (36.3 C) 98.5 F (36.9 C) 99.6 F (37.6 C) 99.2 F (37.3 C)  TempSrc: Oral Oral Oral Oral  SpO2: 100% 98% 98% 98%  Weight:      Height:        Intake/Output Summary (Last 24 hours) at  01/23/2021 1717 Last data filed at 01/23/2021 0600 Gross per 24 hour  Intake 360 ml  Output 900 ml  Net -540 ml   Filed Weights   01/13/21 2212  Weight: 77.1 kg    Examination:  General: No acute distress. Cardiovascular: Heart sounds show Alizay Bronkema regular rate, and rhythm Lungs: Clear to auscultation bilaterally  Abdomen: Soft, nontender, nondistended  Neurological: Alert, disoriented. Moves all extremities 4 . Cranial nerves II through XII grossly intact. Skin: Warm and dry. No rashes or lesions. Extremities: No clubbing or cyanosis. No edema.    Data Reviewed: I have personally reviewed following labs and imaging studies  CBC: Recent Labs  Lab 01/19/21 0429 01/20/21 0540 01/21/21 0601 01/22/21 0458 01/23/21 0534  WBC 5.6 6.0 6.4 7.2 6.9  NEUTROABS 2.9 3.7 4.1 5.6 4.8  HGB 11.0* 12.6* 11.0* 12.1* 11.8*  HCT 32.4* 36.7* 33.2* 35.4* 35.7*  MCV 88.5 88.2 90.7 89.4 92.0  PLT 222 255 241 249 627    Basic Metabolic Panel: Recent Labs  Lab 01/17/21 0450 01/17/21 1554 01/18/21 0517 01/18/21 1831 01/19/21 1847 01/20/21 1059 01/21/21 0601 01/22/21 0458 01/23/21 0534  NA 145   < > 147*   < > 142 135 140 139 139  K 2.8*   < > 3.1*   < > 3.7 3.3* 3.4* 4.1 3.9  CL 112*   < > 110   < > 108 105 110 107 107  CO2 24   < > 28   < > 24 22 22 25 24   GLUCOSE 117*   < > 104*   < > 155* 126* 78 81 81  BUN 27*   < > 20   < > 13 13 11 12 14   CREATININE 1.66*   < > 1.07   < > 0.91 0.80 0.80 1.02 1.32*  CALCIUM 8.3*   < > 8.4*   < > 8.6* 8.3* 8.5* 8.5* 8.8*  MG 1.5*  --  1.6*  --   --   --  1.4* 1.9 1.7  PHOS  --   --  3.2  --   --   --  2.6 3.5 3.6   < > = values in this interval not displayed.    GFR: Estimated Creatinine Clearance: 68.1 mL/min (Sava Proby) (by C-G formula based on SCr of 1.32 mg/dL (H)).  Liver Function Tests: Recent Labs  Lab 01/19/21 0429 01/20/21 1059 01/21/21 0601 01/22/21 0458 01/23/21 0534  AST 44* 35 25 30 21   ALT 42 42 35 38 32  ALKPHOS 69 79 66 74 74   BILITOT 0.7 0.6 0.5 0.8 0.8  PROT 5.1* 6.0* 5.3* 5.6* 5.5*  ALBUMIN 2.7* 3.1* 2.7* 2.9* 3.1*    CBG: Recent Labs  Lab 01/23/21 0006 01/23/21 0402 01/23/21 0609 01/23/21 1128 01/23/21 1615  GLUCAP 98 73 75 102* 102*     Recent Results (from the past 240 hour(s))  Resp Panel by RT-PCR (Flu Imajean Mcdermid&B, Covid) Nasopharyngeal Swab     Status: None   Collection Time: 01/13/21  9:54 PM   Specimen: Nasopharyngeal Swab; Nasopharyngeal(NP) swabs in vial transport medium  Result Value Ref Range Status   SARS Coronavirus 2 by RT PCR NEGATIVE NEGATIVE Final    Comment: (NOTE) SARS-CoV-2 target nucleic acids are NOT DETECTED.  The SARS-CoV-2 RNA is generally detectable in upper respiratory specimens during the acute phase of infection. The lowest concentration of SARS-CoV-2 viral copies this assay can detect is 138 copies/mL. Keirra Zeimet negative result does not preclude SARS-Cov-2 infection and should not be used as the sole basis for treatment or other patient management decisions. Lamir Racca negative result may occur with  improper specimen collection/handling, submission of specimen other than nasopharyngeal swab, presence of viral mutation(s) within the areas targeted by this assay, and inadequate number of viral copies(<138 copies/mL). Bobette Leyh negative result must be combined with clinical observations, patient history, and epidemiological information. The expected result is Negative.  Fact Sheet for Patients:  EntrepreneurPulse.com.au  Fact Sheet for Healthcare Providers:  IncredibleEmployment.be  This test is no t yet approved or cleared by the [place] FDA and  has been authorized for detection and/or diagnosis of SARS-CoV-2 by FDA under an Emergency Use Authorization (EUA). This EUA will remain  in effect (meaning this test can be used) for the duration of the COVID-19 declaration under Section 564(b)(1) of the Act, 21 U.S.C.section 360bbb-3(b)(1), unless the  authorization is terminated  or revoked sooner.       Influenza Logyn Dedominicis by PCR NEGATIVE NEGATIVE Final   Influenza B by PCR NEGATIVE NEGATIVE Final    Comment: (NOTE) The Xpert Xpress SARS-CoV-2/FLU/RSV plus assay is intended as an aid in the diagnosis of influenza from Nasopharyngeal swab specimens and should not be used as Lucine Bilski sole basis for treatment. Nasal washings and aspirates are unacceptable for Xpert Xpress SARS-CoV-2/FLU/RSV testing.  Fact Sheet for Patients: EntrepreneurPulse.com.au  Fact Sheet for Healthcare Providers: IncredibleEmployment.be  This test is not yet approved or cleared by the [place] FDA and has been authorized for detection and/or diagnosis of SARS-CoV-2 by FDA under an Emergency Use Authorization (EUA). This EUA will remain in effect (meaning this test can be used) for the duration of the COVID-19 declaration under Section 564(b)(1) of the Act, 21 U.S.C. section 360bbb-3(b)(1), unless the authorization is terminated or revoked.  Performed at [facility], [place] [address] [place], [place] [address]   Blood Culture (routine x 2)     Status: None   Collection Time: 01/13/21  9:54 PM   Specimen: BLOOD  Result Value Ref Range Status   Specimen Description   Final    BLOOD RIGHT ANTECUBITAL Performed at [place] [address] [place], [place] [address]    Special Requests   Final    BOTTLES DRAWN AEROBIC ONLY Blood Culture results may not be optimal due to an inadequate volume of blood received in culture bottles Performed at [place] [address], [place],  [address]    Culture   Final  NO GROWTH 5 DAYS Performed at [facility], [place] [address] [place], [place] [address]    Report Status 01/19/2021 FINAL  Final  Urine culture     Status: None   Collection Time: 01/13/21  9:54 PM   Specimen: In/Out Cath Urine  Result Value  Ref Range Status   Specimen Description   Final    IN/OUT CATH URINE Performed at [place] [address] [place], [place] [address]    Special Requests   Final    NONE Performed at [facility], [place] [address], [place], [place] [address]    Culture   Final    NO GROWTH Performed at [facility], [place] [address] [place], [place] [address]    Report Status 01/15/2021 FINAL  Final  Blood Culture (routine x 2)     Status: None   Collection Time: 01/13/21  9:59 PM   Specimen: BLOOD  Result Value Ref Range Status   Specimen Description   Final    BLOOD LEFT ANTECUBITAL Performed at [place] [address], [place], [place] [address]    Special Requests   Final    BOTTLES DRAWN AEROBIC AND ANAEROBIC Blood Culture adequate volume Performed at [place] [address], [place], [place] [address]    Culture   Final    NO GROWTH 5 DAYS Performed at [facility], [place] [address], [place], [place] [address]    Report Status 01/19/2021 FINAL  Final  MRSA PCR Screening     Status: None   Collection Time: 01/15/21 11:45 AM   Specimen: Nasopharyngeal  Result Value Ref Range Status   MRSA by PCR NEGATIVE NEGATIVE Final    Comment:        The GeneXpert MRSA Assay (FDA approved for NASAL specimens only), is one component of Kavontae Pritchard comprehensive MRSA colonization surveillance program. It is not intended to diagnose MRSA infection nor to guide or monitor treatment for MRSA infections. Performed at [facility], [place] [address], [place], [place] [address]   C Difficile Quick Screen w PCR reflex     Status: None   Collection Time: 01/16/21 10:42 AM   Specimen: STOOL  Result Value Ref Range Status   C Diff antigen NEGATIVE NEGATIVE Final   C Diff toxin NEGATIVE NEGATIVE Final   C Diff interpretation No C. difficile detected.  Final    Comment: Performed at [facility], [place] [address], [place], [place] [address]         Radiology Studies: DG CHEST PORT 1 VIEW  Result Date: 01/22/2021 CLINICAL DATA:  Inpatient, concern for aspiration, confusion EXAM: PORTABLE CHEST 1 VIEW COMPARISON:  01/13/2021 chest radiograph. FINDINGS: Stable cardiomediastinal silhouette with normal heart size. No pneumothorax. No pleural effusion. Lungs appear clear, with no acute consolidative airspace disease and no pulmonary edema. IMPRESSION: No active disease. Electronically Signed   By: Ilona Sorrel M.D.   On: 01/22/2021 11:07        Scheduled Meds: . Chlorhexidine Gluconate Cloth  6 each Topical Daily  . citalopram  10 mg Oral QHS  . levETIRAcetam  500 mg Oral BID  . magnesium oxide  400 mg Oral Daily  . mouth rinse  15 mL Mouth Rinse BID  . pantoprazole  40 mg Oral Daily  . potassium chloride  40 mEq Oral Daily  . predniSONE  10 mg Oral Q breakfast  .  QUEtiapine  25 mg Oral BID  . senna-docusate  1 tablet Oral QHS  . traZODone  25 mg Oral QHS   Continuous Infusions:    LOS: 9 days    Time spent: over 30 min    Fayrene Helper, MD [facility]   To contact the attending provider between 7A-7P or the covering provider during after hours 7P-7A, please log into the web site [email or web address] and access using universal [place] password for that web site. If you do not have the password, please call the hospital operator.  01/23/2021, 5:17 PM

## 2021-01-23 NOTE — TOC Progression Note (Signed)
Transition of Care [facility]) - Progression Note    Patient Details  Name: Sean [name] MRN: [id number] Date of Birth: July 17, 1966  Transition of Care [facility]) CM/SW [place], [place] Phone Number: 01/23/2021, 2:04 PM  Clinical Narrative:   Received call back from Mr Gerald Stabs at business office in [place], [place] [address], re: my inquiry about facility expectations. He stated they are currently COVID active.  This means they do not have available staffing to deal with increased monitoring of patients, particularly due to behavioral issues.  He anticipates this will remain in place through the remainder of this week at least.  Furthermore, he stated that someone in restraints would absolutely not be able to come to their facilities, and could not guarantee that someone on 1:1 staffing here could return even if there were no noted documented incidents for 48 hours. TOC will continue to follow during the course of hospitalization.     Expected Discharge Plan: [place] Barriers to Discharge: Continued Medical Work up  Expected Discharge Plan and Services Expected Discharge Plan: [place]   Discharge Planning Services: CM Consult   Living arrangements for the past 2 months: [place]                                       Social Determinants of Health (SDOH) Interventions    Readmission Risk Interventions No flowsheet data found.

## 2021-01-23 NOTE — Plan of Care (Signed)
°  Problem: Respiratory: °Goal: Ability to maintain adequate ventilation will improve °Outcome: Progressing °  °Problem: Coping: °Goal: Level of anxiety will decrease °Outcome: Progressing °  °

## 2021-01-23 NOTE — Progress Notes (Signed)
  Speech Language Pathology Treatment: Dysphagia  Patient Details Name: Sean [name] MRN: [id number] DOB: 1965/11/17 Today's Date: 01/23/2021 Time: 2951-8841 SLP Time Calculation (min) (ACUTE ONLY): 15 min  Assessment / Plan / Recommendation Clinical Impression  Pt demonstrates decreased cognition in attempts to feed himself resulting in him attempting to dip spoonful of magic cup into tea. 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  Also left occipital and left frontal lesions resected 09/2020. Today pt was able to masticate and orally clear magic cup and peaches easily without delay.   NO indications of aspiration with intake nor symptoms of dysphagia however he only took a few bites/sips.   Pt declined to consume any liquids despite encouragement. Suspect his mentation and participation improved today and thus improved tolerance of diet.    Note plans for palliative care follow up at facility. Will follow for full meal observation to assess tolerance and readiness for advanced textures.   His swallow function likely will vary dependent on his medications/mentation, etc -thus recommend consider advancing to higher level diet to maximize QOL and intake. SLP left magic cup for pt to use to take his medications.  Good tolerance of po diet today.  Pt with language deficits - however he was pleasant and cooperative during entire session.    HPI HPI: Patient is a 55 y.o. 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 Per patient's wife, he had been having nausea and vomitting past few days and had been unable to keep his medications down.  In ER, he was hypotensive and tachycardic, febrile with temperature at 103 F. CXR unremarkable, UA unremarkable and CT head shows hemorrhagic metastasis. Per RN report, patient was not consistently swallowing, was holding  medications in mouth at times and holding food; he then had vomitting episode which contained whole pieces of food that he apparently had not fully masticated.  Pt underwent BSE on 5/9 and diet was changed to dys2/thin - follow up briefly for dysphagia management indicated.      SLP Plan  Continue with current plan of care       Recommendations  Diet recommendations: Dysphagia 2 (fine chop);Thin liquid Liquids provided via: Cup;Straw Medication Administration: Whole meds with puree Compensations: Minimize environmental distractions;Lingual sweep for clearance of pocketing Postural Changes and/or Swallow Maneuvers: Seated upright 90 degrees;Upright 30-60 min after meal                Oral Care Recommendations: Oral care BID;Staff/trained caregiver to provide oral care;Other (Comment) SLP Visit Diagnosis: Dysphagia, oral phase (R13.11) Plan: Continue with current plan of care       [place], Tailey Top Ann 01/23/2021, 8:33 PM  Kathleen Lime, MS [facility] SLP Acute Rehab Services Office [phone] Pager [phone]

## 2021-01-24 LAB — CBC WITH DIFFERENTIAL/PLATELET
Abs Immature Granulocytes: 0.12 10*3/uL — ABNORMAL HIGH (ref 0.00–0.07)
Basophils Absolute: 0 10*3/uL (ref 0.0–0.1)
Basophils Relative: 1 %
Eosinophils Absolute: 0.1 10*3/uL (ref 0.0–0.5)
Eosinophils Relative: 2 %
HCT: 32.8 % — ABNORMAL LOW (ref 39.0–52.0)
Hemoglobin: 11 g/dL — ABNORMAL LOW (ref 13.0–17.0)
Immature Granulocytes: 2 %
Lymphocytes Relative: 17 %
Lymphs Abs: 1.1 10*3/uL (ref 0.7–4.0)
MCH: 30.1 pg (ref 26.0–34.0)
MCHC: 33.5 g/dL (ref 30.0–36.0)
MCV: 89.9 fL (ref 80.0–100.0)
Monocytes Absolute: 0.8 10*3/uL (ref 0.1–1.0)
Monocytes Relative: 13 %
Neutro Abs: 4 10*3/uL (ref 1.7–7.7)
Neutrophils Relative %: 65 %
Platelets: 228 10*3/uL (ref 150–400)
RBC: 3.65 MIL/uL — ABNORMAL LOW (ref 4.22–5.81)
RDW: 12.7 % (ref 11.5–15.5)
WBC: 6 10*3/uL (ref 4.0–10.5)
nRBC: 0 % (ref 0.0–0.2)

## 2021-01-24 LAB — COMPREHENSIVE METABOLIC PANEL
ALT: 25 U/L (ref 0–44)
AST: 16 U/L (ref 15–41)
Albumin: 2.9 g/dL — ABNORMAL LOW (ref 3.5–5.0)
Alkaline Phosphatase: 71 U/L (ref 38–126)
Anion gap: 7 (ref 5–15)
BUN: 17 mg/dL (ref 6–20)
CO2: 24 mmol/L (ref 22–32)
Calcium: 8.7 mg/dL — ABNORMAL LOW (ref 8.9–10.3)
Chloride: 107 mmol/L (ref 98–111)
Creatinine, Ser: 1.65 mg/dL — ABNORMAL HIGH (ref 0.61–1.24)
GFR, Estimated: 48 mL/min — ABNORMAL LOW (ref >60)
Glucose, Bld: 86 mg/dL (ref 70–99)
Potassium: 3.9 mmol/L (ref 3.5–5.1)
Sodium: 138 mmol/L (ref 135–145)
Total Bilirubin: 0.8 mg/dL (ref 0.3–1.2)
Total Protein: 5.3 g/dL — ABNORMAL LOW (ref 6.5–8.1)

## 2021-01-24 LAB — GLUCOSE, CAPILLARY
Glucose-Capillary: 110 mg/dL — ABNORMAL HIGH (ref 70–99)
Glucose-Capillary: 125 mg/dL — ABNORMAL HIGH (ref 70–99)
Glucose-Capillary: 63 mg/dL — ABNORMAL LOW (ref 70–99)
Glucose-Capillary: 80 mg/dL (ref 70–99)

## 2021-01-24 LAB — MAGNESIUM: Magnesium: 1.7 mg/dL (ref 1.7–2.4)

## 2021-01-24 LAB — PHOSPHORUS: Phosphorus: 4.3 mg/dL (ref 2.5–4.6)

## 2021-01-24 MED ORDER — DEXTROSE 50 % IV SOLN
50.0000 mL | INTRAVENOUS | Status: DC | PRN
  Administered 2021-01-25: 50 mL via INTRAVENOUS
  Filled 2021-01-24: qty 50

## 2021-01-24 MED ORDER — DEXTROSE 50 % IV SOLN
INTRAVENOUS | Status: AC
  Administered 2021-01-25: 50 mL
  Filled 2021-01-24: qty 50

## 2021-01-24 NOTE — Progress Notes (Signed)
[facility] PROGRESS NOTE    Sean [name]  KDT:[id number] DOB: 12-05-1965 DOA: 01/13/2021 PCP: [name], MD      Brief Narrative:  Sean [name] is a 55 y.o. M with metastatic melanoma, metastatic to brain who presented with being found unresponsive in his wheelchair.  Currently lives at [facility], found unresponsive there.  Brought to ER, where he was febrile, to 103F, tachycardic, WBC 17.  CXR with low volumes, presumably pneumonia.  Started on antibiotics and admitted.         Assessment & Plan:  acute Metabolic encephalopathy due to sepsis and brain metastases Sepsis, likely due to pneumonia At baseline is interactive, oriented to family.  Presented confused and somnolent due to sepsis, superimposed on progressive metastasis.  Was febrile, tachycardic, with encephalopathy and with nondiagnostic CXR but suspect lung source given aspiration noted here.  Treated with antibiotics for sepsis for 5 days, sepsis physiology resolved.  His agitation and disorientation is improving with starting Serouel -Continue Seroquel    Metastatic melanoma to the brain -Continue Keppra - Continue prednisone taper  Aspiration event - Diet changed to dysphagia diet per SLP  Acute kidney injury History of tubulointerstitial nephritis Creatinine resolved to baseline  Hypomagnesemia Hypokalemia Resolved  Anemia of chronic disease Hemoglobin stable relative to baseline   Other medications -Continue citalopram and PPI        Disposition: Status is: Inpatient  Remains inpatient appropriate because:Unsafe d/c plan he had wrist restraints yesterday and SNF deferred acceptance.  Today, restraints are off, he seems to be responding to Seroquel.        Dispo: The patient is from: SNF              Anticipated d/c is to: SNF              Patient currently is medically stable to d/c.   Difficult to place patient Yes       Level of care:  Med-Surg       MDM: The below labs and imaging reports were reviewed and summarized above.  Medication management as above.    DVT prophylaxis: SCDs Start: 01/14/21 0404  Code Status: FULL Family Communication: Wife by phone            Subjective: No fever, no respiratory distress, no vomiting.  Patient is sluggish and sleepy.  Answer some of my questions, is not oriented to place.  Closes his eyes again, mostly reticent.  Objective: Vitals:   01/23/21 1425 01/23/21 1959 01/24/21 0613 01/24/21 1343  BP: (!) 100/54 114/75 103/64 102/63  Pulse: 99 100 77 95  Resp: 16 18 16 16   Temp: 99.2 F (37.3 C) 98.9 F (37.2 C) 98.5 F (36.9 C) 97.9 F (36.6 C)  TempSrc: Oral Oral Oral Oral  SpO2: 98% 100% 97% 99%  Weight:      Height:        Intake/Output Summary (Last 24 hours) at 01/24/2021 1643 Last data filed at 01/24/2021 1023 Gross per 24 hour  Intake 120 ml  Output 2800 ml  Net -2680 ml   Filed Weights   01/13/21 2212  Weight: 77.1 kg    Examination: General appearance:  adult male, arousable, answers questions once or twice, then goes back to sleep, in no obvious distress.   HEENT: Anicteric, conjunctiva pink, lids and lashes normal. No nasal deformity, discharge, epistaxis.  Lips moist, oropharynx tacky dry, no oral lesions, dentition okay.   Skin: Warm and dry.  No jaundice.  No suspicious rashes or lesions. Cardiac: RRR, nl S1-S2, no murmurs appreciated.  Capillary refill is brisk.  JVP normal.  No LE edema.  Radial pulses 2+ and symmetric. Respiratory: Normal respiratory rate and rhythm.  CTAB without rales or wheezes. Abdomen: Abdomen soft.  No TTP or guarding. No ascites, distension, hepatosplenomegaly.   MSK: No deformities or effusions. Neuro: Awake when aroused, then falls back asleep.  EOMI, moves all extremities severe generalized weakness. Speech fluent.    Psych: Oriented to self only, attention diminished, affect blunted, judgment insight  appear impaired, no agitation    Data Reviewed: I have personally reviewed following labs and imaging studies:  CBC: Recent Labs  Lab 01/20/21 0540 01/21/21 0601 01/22/21 0458 01/23/21 0534 01/24/21 0502  WBC 6.0 6.4 7.2 6.9 6.0  NEUTROABS 3.7 4.1 5.6 4.8 4.0  HGB 12.6* 11.0* 12.1* 11.8* 11.0*  HCT 36.7* 33.2* 35.4* 35.7* 32.8*  MCV 88.2 90.7 89.4 92.0 89.9  PLT 255 241 249 236 789   Basic Metabolic Panel: Recent Labs  Lab 01/18/21 0517 01/18/21 1831 01/20/21 1059 01/21/21 0601 01/22/21 0458 01/23/21 0534 01/24/21 0502  NA 147*   < > 135 140 139 139 138  K 3.1*   < > 3.3* 3.4* 4.1 3.9 3.9  CL 110   < > 105 110 107 107 107  CO2 28   < > 22 22 25 24 24   GLUCOSE 104*   < > 126* 78 81 81 86  BUN 20   < > 13 11 12 14 17   CREATININE 1.07   < > 0.80 0.80 1.02 1.32* 1.65*  CALCIUM 8.4*   < > 8.3* 8.5* 8.5* 8.8* 8.7*  MG 1.6*  --   --  1.4* 1.9 1.7 1.7  PHOS 3.2  --   --  2.6 3.5 3.6 4.3   < > = values in this interval not displayed.   GFR: Estimated Creatinine Clearance: 54.5 mL/min (A) (by C-G formula based on SCr of 1.65 mg/dL (H)). Liver Function Tests: Recent Labs  Lab 01/20/21 1059 01/21/21 0601 01/22/21 0458 01/23/21 0534 01/24/21 0502  AST 35 25 30 21 16   ALT 42 35 38 32 25  ALKPHOS 79 66 74 74 71  BILITOT 0.6 0.5 0.8 0.8 0.8  PROT 6.0* 5.3* 5.6* 5.5* 5.3*  ALBUMIN 3.1* 2.7* 2.9* 3.1* 2.9*   No results for input(s): LIPASE, AMYLASE in the last 168 hours. No results for input(s): AMMONIA in the last 168 hours. Coagulation Profile: No results for input(s): INR, PROTIME in the last 168 hours. Cardiac Enzymes: No results for input(s): CKTOTAL, CKMB, CKMBINDEX, TROPONINI in the last 168 hours. BNP (last 3 results) No results for input(s): PROBNP in the last 8760 hours. HbA1C: No results for input(s): HGBA1C in the last 72 hours. CBG: Recent Labs  Lab 01/23/21 1128 01/23/21 1615 01/23/21 2306 01/24/21 0611 01/24/21 1210  GLUCAP 102* 102* 110* 80  125*   Lipid Profile: No results for input(s): CHOL, HDL, LDLCALC, TRIG, CHOLHDL, LDLDIRECT in the last 72 hours. Thyroid Function Tests: No results for input(s): TSH, T4TOTAL, FREET4, T3FREE, THYROIDAB in the last 72 hours. Anemia Panel: No results for input(s): VITAMINB12, FOLATE, FERRITIN, TIBC, IRON, RETICCTPCT in the last 72 hours. Urine analysis:    Component Value Date/Time   COLORURINE YELLOW 01/13/2021 2154   APPEARANCEUR CLEAR 01/13/2021 2154   LABSPEC 1.011 01/13/2021 2154   PHURINE 5.0 01/13/2021 2154   GLUCOSEU NEGATIVE 01/13/2021 2154   HGBUR  NEGATIVE 01/13/2021 2154   BILIRUBINUR NEGATIVE 01/13/2021 2154   KETONESUR 20 (A) 01/13/2021 2154   PROTEINUR NEGATIVE 01/13/2021 2154   NITRITE NEGATIVE 01/13/2021 2154   LEUKOCYTESUR NEGATIVE 01/13/2021 2154   Sepsis Labs: @LABRCNTIP (procalcitonin:4,lacticacidven:4)  ) Recent Results (from the past 240 hour(s))  MRSA PCR Screening     Status: None   Collection Time: 01/15/21 11:45 AM   Specimen: Nasopharyngeal  Result Value Ref Range Status   MRSA by PCR NEGATIVE NEGATIVE Final    Comment:        The GeneXpert MRSA Assay (FDA approved for NASAL specimens only), is one component of a comprehensive MRSA colonization surveillance program. It is not intended to diagnose MRSA infection nor to guide or monitor treatment for MRSA infections. Performed at [facility], [place] [address] [place], [place] [address]   C Difficile Quick Screen w PCR reflex     Status: None   Collection Time: 01/16/21 10:42 AM   Specimen: STOOL  Result Value Ref Range Status   C Diff antigen NEGATIVE NEGATIVE Final   C Diff toxin NEGATIVE NEGATIVE Final   C Diff interpretation No C. difficile detected.  Final    Comment: Performed at [facility], [place] [address] [place], [place] [address]         Radiology Studies: No results found.      Scheduled Meds: . Chlorhexidine Gluconate Cloth  6  each Topical Daily  . citalopram  10 mg Oral QHS  . levETIRAcetam  500 mg Oral BID  . magnesium oxide  400 mg Oral Daily  . mouth rinse  15 mL Mouth Rinse BID  . pantoprazole  40 mg Oral Daily  . potassium chloride  40 mEq Oral Daily  . predniSONE  10 mg Oral Q breakfast  . QUEtiapine  25 mg Oral BID  . senna-docusate  1 tablet Oral QHS  . traZODone  25 mg Oral QHS   Continuous Infusions:   LOS: 10 days    Time spent: 45 minutes    Edwin Dada, MD [facility] 01/24/2021, 4:43 PM     Please page though [place] or Epic secure chat:  For [employer], Adult nurse

## 2021-01-24 NOTE — TOC Progression Note (Signed)
Transition of Care [facility]) - Progression Note    Patient Details  Name: Sean [name] MRN: [id number] Date of Birth: [birth year]  Transition of Care [facility]) CM/SW [place], [place] Phone Number: 01/24/2021, 2:26 PM  Clinical Narrative:   Patient has been out of soft wrist restraints with no incidents for about 4 hours.  Spoke with Mr Gerald Stabs at [facility], who asked that I send notes related to behavioral issues and MAR for his review.  Sent. TOC will continue to follow during the course of hospitalization.     Expected Discharge Plan: [place] Barriers to Discharge: Continued Medical Work up  Expected Discharge Plan and Services Expected Discharge Plan: [place]   Discharge Planning Services: CM Consult   Living arrangements for the past 2 months: [place]                                       Social Determinants of Health (SDOH) Interventions    Readmission Risk Interventions No flowsheet data found.

## 2021-01-24 NOTE — Progress Notes (Signed)
Writer will attempt to remove wrist restraints as pt has been less combative. Discussed with pt, pt verbalized understanding and agreed. Will continue to monitor.

## 2021-01-24 NOTE — Progress Notes (Signed)
Pt attempted to get out of bed x2 and needed several cues to be redirected. Writer and other staff were able to redirect pt and wrist restraints were reapplied. Will continue to monitor.

## 2021-01-25 DIAGNOSIS — C7931 Secondary malignant neoplasm of brain: Secondary | ICD-10-CM | POA: Diagnosis not present

## 2021-01-25 LAB — GLUCOSE, CAPILLARY
Glucose-Capillary: 112 mg/dL — ABNORMAL HIGH (ref 70–99)
Glucose-Capillary: 112 mg/dL — ABNORMAL HIGH (ref 70–99)
Glucose-Capillary: 122 mg/dL — ABNORMAL HIGH (ref 70–99)
Glucose-Capillary: 150 mg/dL — ABNORMAL HIGH (ref 70–99)
Glucose-Capillary: 66 mg/dL — ABNORMAL LOW (ref 70–99)
Glucose-Capillary: 81 mg/dL (ref 70–99)

## 2021-01-25 MED ORDER — ENSURE ENLIVE PO LIQD
237.0000 mL | Freq: Two times a day (BID) | ORAL | Status: DC
  Administered 2021-01-25 – 2021-01-26 (×2): 237 mL via ORAL

## 2021-01-25 MED ORDER — QUETIAPINE FUMARATE 25 MG PO TABS
50.0000 mg | ORAL_TABLET | Freq: Two times a day (BID) | ORAL | Status: DC
  Administered 2021-01-25 – 2021-01-26 (×3): 50 mg via ORAL
  Filled 2021-01-25 (×3): qty 2

## 2021-01-25 NOTE — Progress Notes (Signed)
[facility] PROGRESS NOTE    Sean [name]  [id number] DOB: 1965-10-07 DOA: 01/13/2021 PCP: [name], MD      Brief Narrative:  [name] is a 55 y.o. M with metastatic melanoma, metastatic to brain who presented with being found unresponsive in his wheelchair.  Currently lives at [facility], found unresponsive there.  Brought to ER, where he was febrile, to 103F, tachycardic, WBC 17.  CXR with low volumes, presumably pneumonia.  Started on antibiotics and admitted.         Assessment & Plan:  Acute metabolic encephalopathy due to sepsis  Now stable confusion due to brain metastases Sepsis, likely due to pneumonia At baseline is interactive, oriented to family, able to follow commands and pleasant but forgetful of month/year.  Here, presented confused and somnolent due to sepsis.  Was febrile, tachycardic, with encephalopathy and with nondiagnostic CXR but suspect lung source given aspiration noted here.  Treated with antibiotics for sepsis for 5 days, sepsis physiology resolved.  Now however, despite the somnolence/encephalopathy from sepsis resolving, he remains more confused and withdrawn than previously.    He was again impulsive and agitated with staff last night, but it appears overall the trend is that he is more cooperative than prior -Continue Seroquel, increase dose     Metastatic melanoma to the brain He has some residual confusion probably progression of his tumor.  It has not really improved with steroids (for his AIN).  His cancer-directed treatment options were limited by renal injury from Nivolumab.  I do not believe that Oncology feel he has any treatment options left, and his functional status is clearly worsening.    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   - Continue prednisone -Transition to  Hospice  Hypomagnesemia Hypokalemia -Continue potassium suppl  Anemia of chronic disease Hemoglobin stable relative to baseline   Other medications -Continue citalopram and PPI        Disposition: Status is: Inpatient  Remains inpatient appropriate because:Unsafe d/c plan  He now appears to be declining, I believe we will transition to hospice and hopefully he will be able to transition back to [place] in the next 1-2 days       Dispo: The patient is from: SNF              Anticipated d/c is to: SNF              Patient currently is medically stable to d/c.   Difficult to place patient Yes       Level of care: Med-Surg       MDM: The below labs and imaging reports were reviewed and summarized above.  Medication management as above.       DVT prophylaxis: SCDs Start: 01/14/21 0404  Code Status: FULL Family Communication: Wife by phone            Subjective: No fever, respiratory distress, vomiting.  He is sleepy, but makes eye contact.  Answers questions sometimes, does not know other times.  Too weak to get out of bed.  No complaints of chest pain, abdominal pain, headache.         Objective: Vitals:   01/24/21 1343 01/24/21 2020 01/25/21 0554 01/25/21 1319  BP: 102/63 113/72 110/71 91/60  Pulse: 95 94 (!) 105 100  Resp: 16 18 16 16   Temp: 97.9 F (36.6 C) 98.2 F (36.8 C) 99.3 F (  37.4 C) 99 F (37.2 C)  TempSrc: Oral Oral Oral Oral  SpO2: 99% (!) 89% 97% 100%  Weight:      Height:        Intake/Output Summary (Last 24 hours) at 01/25/2021 1353 Last data filed at 01/25/2021 0600 Gross per 24 hour  Intake 100 ml  Output 2300 ml  Net -2200 ml   Filed Weights   01/13/21 2212  Weight: 77.1 kg    Examination: General appearance:     Thin adult male, lying in bed, appears debilitated   HEENT:  Skin: No suspicious rashes or lesions Cardiac: RRR, soft systolic murmur, no lower extremity edema Respiratory: Normal  respiratory rate and rhythm, lungs clear without rales or wheezes Abdomen: Abdomen soft without tenderness to palpation or guarding, no masses, ascites, distention MSK: Diffuse loss of subcutaneous muscle mass and fat wasting, temporal wasting Neuro: Sleeping, arouses to voice, answers questions sometimes, very slow responses, moves all extremities with severe generalized weakness, speech slow but without dysarthria Psych: Oriented to self only, recognizes his brother, attention diminished, affect blunted, judgment insight appear impaired        Data Reviewed: I have personally reviewed following labs and imaging studies:  CBC: Recent Labs  Lab 01/20/21 0540 01/21/21 0601 01/22/21 0458 01/23/21 0534 01/24/21 0502  WBC 6.0 6.4 7.2 6.9 6.0  NEUTROABS 3.7 4.1 5.6 4.8 4.0  HGB 12.6* 11.0* 12.1* 11.8* 11.0*  HCT 36.7* 33.2* 35.4* 35.7* 32.8*  MCV 88.2 90.7 89.4 92.0 89.9  PLT 255 241 249 236 885   Basic Metabolic Panel: Recent Labs  Lab 01/20/21 1059 01/21/21 0601 01/22/21 0458 01/23/21 0534 01/24/21 0502  NA 135 140 139 139 138  K 3.3* 3.4* 4.1 3.9 3.9  CL 105 110 107 107 107  CO2 22 22 25 24 24   GLUCOSE 126* 78 81 81 86  BUN 13 11 12 14 17   CREATININE 0.80 0.80 1.02 1.32* 1.65*  CALCIUM 8.3* 8.5* 8.5* 8.8* 8.7*  MG  --  1.4* 1.9 1.7 1.7  PHOS  --  2.6 3.5 3.6 4.3   GFR: Estimated Creatinine Clearance: 54.5 mL/min (A) (by C-G formula based on SCr of 1.65 mg/dL (H)). Liver Function Tests: Recent Labs  Lab 01/20/21 1059 01/21/21 0601 01/22/21 0458 01/23/21 0534 01/24/21 0502  AST 35 25 30 21 16   ALT 42 35 38 32 25  ALKPHOS 79 66 74 74 71  BILITOT 0.6 0.5 0.8 0.8 0.8  PROT 6.0* 5.3* 5.6* 5.5* 5.3*  ALBUMIN 3.1* 2.7* 2.9* 3.1* 2.9*   No results for input(s): LIPASE, AMYLASE in the last 168 hours. No results for input(s): AMMONIA in the last 168 hours. Coagulation Profile: No results for input(s): INR, PROTIME in the last 168 hours. Cardiac Enzymes: No  results for input(s): CKTOTAL, CKMB, CKMBINDEX, TROPONINI in the last 168 hours. BNP (last 3 results) No results for input(s): PROBNP in the last 8760 hours. HbA1C: No results for input(s): HGBA1C in the last 72 hours. CBG: Recent Labs  Lab 01/24/21 2344 01/25/21 0022 01/25/21 0555 01/25/21 0618 01/25/21 1108  GLUCAP 63* 150* 66* 112* 81   Lipid Profile: No results for input(s): CHOL, HDL, LDLCALC, TRIG, CHOLHDL, LDLDIRECT in the last 72 hours. Thyroid Function Tests: No results for input(s): TSH, T4TOTAL, FREET4, T3FREE, THYROIDAB in the last 72 hours. Anemia Panel: No results for input(s): VITAMINB12, FOLATE, FERRITIN, TIBC, IRON, RETICCTPCT in the last 72 hours. Urine analysis:    Component Value Date/Time   COLORURINE  YELLOW 01/13/2021 2154   APPEARANCEUR CLEAR 01/13/2021 2154   LABSPEC 1.011 01/13/2021 2154   PHURINE 5.0 01/13/2021 2154   GLUCOSEU NEGATIVE 01/13/2021 2154   HGBUR NEGATIVE 01/13/2021 2154   BILIRUBINUR NEGATIVE 01/13/2021 2154   KETONESUR 20 (A) 01/13/2021 2154   PROTEINUR NEGATIVE 01/13/2021 2154   NITRITE NEGATIVE 01/13/2021 2154   LEUKOCYTESUR NEGATIVE 01/13/2021 2154   Sepsis Labs: @LABRCNTIP (procalcitonin:4,lacticacidven:4)  ) Recent Results (from the past 240 hour(s))  C Difficile Quick Screen w PCR reflex     Status: None   Collection Time: 01/16/21 10:42 AM   Specimen: STOOL  Result Value Ref Range Status   C Diff antigen NEGATIVE NEGATIVE Final   C Diff toxin NEGATIVE NEGATIVE Final   C Diff interpretation No C. difficile detected.  Final    Comment: Performed at [facility], [place] [address] [place], [place] [address]         Radiology Studies: No results found.      Scheduled Meds: . Chlorhexidine Gluconate Cloth  6 each Topical Daily  . citalopram  10 mg Oral QHS  . feeding supplement  237 mL Oral BID BM  . levETIRAcetam  500 mg Oral BID  . magnesium oxide  400 mg Oral Daily  . mouth rinse  15 mL  Mouth Rinse BID  . pantoprazole  40 mg Oral Daily  . potassium chloride  40 mEq Oral Daily  . predniSONE  10 mg Oral Q breakfast  . QUEtiapine  50 mg Oral BID  . senna-docusate  1 tablet Oral QHS  . traZODone  25 mg Oral QHS   Continuous Infusions:   LOS: 11 days    Time spent: 25 minutes    Edwin Dada, MD [facility] 01/25/2021, 1:53 PM     Please page though [place] or Epic secure chat:  For [employer], Adult nurse

## 2021-01-25 NOTE — Plan of Care (Signed)
  Problem: Fluid Volume: Goal: Hemodynamic stability will improve Outcome: Progressing   Problem: Clinical Measurements: Goal: Diagnostic test results will improve Outcome: Progressing Goal: Signs and symptoms of infection will decrease Outcome: Progressing   Problem: Respiratory: Goal: Ability to maintain adequate ventilation will improve Outcome: Progressing   Problem: Education: Goal: Knowledge of General Education information will improve Description: Including pain rating scale, medication(s)/side effects and non-pharmacologic comfort measures Outcome: Progressing   Problem: Health Behavior/Discharge Planning: Goal: Ability to manage health-related needs will improve Outcome: Progressing   Problem: Clinical Measurements: Goal: Ability to maintain clinical measurements within normal limits will improve Outcome: Progressing Goal: Will remain free from infection Outcome: Progressing Goal: Diagnostic test results will improve Outcome: Progressing Goal: Respiratory complications will improve Outcome: Progressing Goal: Cardiovascular complication will be avoided Outcome: Progressing   Problem: Activity: Goal: Risk for activity intolerance will decrease Outcome: Progressing   Problem: Nutrition: Goal: Adequate nutrition will be maintained Outcome: Progressing   Problem: Coping: Goal: Level of anxiety will decrease Outcome: Progressing   Problem: Elimination: Goal: Will not experience complications related to bowel motility Outcome: Progressing Goal: Will not experience complications related to urinary retention Outcome: Progressing   Problem: Pain Managment: Goal: General experience of comfort will improve Outcome: Progressing   Problem: Safety: Goal: Ability to remain free from injury will improve Outcome: Progressing   Problem: Skin Integrity: Goal: Risk for impaired skin integrity will decrease Outcome: Progressing   Problem: Safety: Goal:  Non-violent Restraint(s) Outcome: Progressing   Problem: Safety: Goal: Non-violent Restraint(s) Outcome: Progressing

## 2021-01-25 NOTE — Progress Notes (Addendum)
HEMATOLOGY-ONCOLOGY PROGRESS NOTE  SUBJECTIVE: The patient is lying in bed.  Staff is feeding him breakfast.  His brother is at the bedside.  He offers no specific complaints this morning.  He continues to require wrist restraints.  Seroquel dose is being adjusted.  Oncology History  Melanoma of skin ([place])  10/05/2020 Initial Diagnosis   Melanoma of skin ([place])   10/05/2020 Cancer Staging   Staging form: Melanoma of the Skin, AJCC 8th Edition - Pathologic: Stage IV (pTX, pNX, pM1) - Signed by [name], MD on 10/05/2020   10/16/2020 -  Chemotherapy    Patient is on Treatment Plan: MELANOMA NIVOLUMAB + IPILIMUMAB (1/3) Q21D / NIVOLUMAB Q14D       PHYSICAL EXAMINATION:  Vitals:   01/24/21 2020 01/25/21 0554  BP: 113/72 110/71  Pulse: 94 (!) 105  Resp: 18 16  Temp: 98.2 F (36.8 C) 99.3 F (37.4 C)  SpO2: (!) 89% 97%   Filed Weights   01/13/21 2212  Weight: 77.1 kg    Intake/Output from previous day: 05/11 0701 - 05/12 0700 In: 100 [P.O.:100] Out: 2950 [Urine:2950]  GENERAL: Awake and alert, no distress SKIN: skin color, texture, turgor are normal, no rashes or significant lesions EYES: normal, Conjunctiva are pink and non-injected, sclera clear OROPHARYNX:no exudate, no erythema and lips, buccal mucosa, and tongue normal  LUNGS: clear to auscultation and percussion with normal breathing effort HEART: regular rate & rhythm and no murmurs and no lower extremity edema ABDOMEN:abdomen soft, non-tender and normal bowel sounds NEURO: Alert, confused, follows commands, moves all extremities to command  LABORATORY DATA:  I have reviewed the data as listed CMP Latest Ref Rng & Units 01/24/2021 01/23/2021 01/22/2021  Glucose 70 - 99 mg/dL 86 81 81  BUN 6 - 20 mg/dL 17 14 12   Creatinine 0.61 - 1.24 mg/dL 1.65(H) 1.32(H) 1.02  Sodium 135 - 145 mmol/L 138 139 139  Potassium 3.5 - 5.1 mmol/L 3.9 3.9 4.1  Chloride 98 - 111 mmol/L 107 107 107  CO2 22 - 32 mmol/L 24 24 25    Calcium 8.9 - 10.3 mg/dL 8.7(L) 8.8(L) 8.5(L)  Total Protein 6.5 - 8.1 g/dL 5.3(L) 5.5(L) 5.6(L)  Total Bilirubin 0.3 - 1.2 mg/dL 0.8 0.8 0.8  Alkaline Phos 38 - 126 U/L 71 74 74  AST 15 - 41 U/L 16 21 30   ALT 0 - 44 U/L 25 32 38    Lab Results  Component Value Date   WBC 6.0 01/24/2021   HGB 11.0 (L) 01/24/2021   HCT 32.8 (L) 01/24/2021   MCV 89.9 01/24/2021   PLT 228 01/24/2021   NEUTROABS 4.0 01/24/2021    CT Head Wo Contrast  Result Date: 01/13/2021 CLINICAL DATA:  Initial evaluation for acute delirium. EXAM: CT HEAD WITHOUT CONTRAST TECHNIQUE: Contiguous axial images were obtained from the base of the skull through the vertex without intravenous contrast. COMPARISON:  Recent CT from 12/18/2020. FINDINGS: Brain: Again seen are multiple hemorrhagic metastases scattered throughout both cerebral hemispheres. Overall, lesions are not appreciably changed or progressed as compared to most recent CT. Most prominent of these lesions positioned at the left parietal lobe in measures approximately 1.8 cm. Localized vasogenic edema about the preponderance of these lesions without midline shift or significant regional mass effect. No evidence for new/interval hemorrhage by CT. No appreciable infratentorial involvement. No acute large vessel territory infarct. Ventricles stable in size without hydrocephalus. No extra-axial fluid collection. Vascular: No hyperdense vessel. Skull: Scalp soft tissues demonstrate no acute  finding. Post craniotomy [name] is noted at the left calvarium. Sinuses/Orbits: Globes and orbital soft tissues demonstrate no acute finding. Visualized paranasal sinuses are clear. No mastoid effusion. Other: None. IMPRESSION: 1. No significant interval change in size and appearance of multiple hemorrhagic metastases scattered throughout both cerebral hemispheres, with similar localized vasogenic edema. No midline shift or significant regional mass effect. 2. No other acute intracranial  abnormality. Electronically Signed   By: Jeannine Boga M.D.   On: 01/13/2021 23:16   DG CHEST PORT 1 VIEW  Result Date: 01/22/2021 CLINICAL DATA:  Inpatient, concern for aspiration, confusion EXAM: PORTABLE CHEST 1 VIEW COMPARISON:  01/13/2021 chest radiograph. FINDINGS: Stable cardiomediastinal silhouette with normal heart size. No pneumothorax. No pleural effusion. Lungs appear clear, with no acute consolidative airspace disease and no pulmonary edema. IMPRESSION: No active disease. Electronically Signed   By: Ilona Sorrel M.D.   On: 01/22/2021 11:07   DG Chest Port 1 View  Result Date: 01/13/2021 CLINICAL DATA:  Unresponsive EXAM: PORTABLE CHEST 1 VIEW COMPARISON:  12/18/2020 FINDINGS: The heart size and mediastinal contours are within normal limits. Both lungs are clear. The visualized skeletal structures are unremarkable. The lung volumes are low. These low lung volumes likely accentuate the upper mediastinum. IMPRESSION: Low lung volumes.  Otherwise, stable exam. Electronically Signed   By: Constance Holster M.D.   On: 01/13/2021 23:05   US BIOPSY (KIDNEY)  Result Date: 12/26/2020 INDICATION: 54 year old male with a history of melanoma metastatic to the brain and now acute kidney injury. Concern for drug related nephritis. He presents for random renal biopsy. EXAM: ULTRASOUND GUIDED RENAL BIOPSY COMPARISON:  None. MEDICATIONS: Fentanyl 50 mcg IV; Versed 4 mg IV ANESTHESIA/SEDATION: Total Moderate Sedation time 10 minutes The patient's vital signs and level of consciousness were monitored continuously by radiology nursing under my direct supervision. COMPLICATIONS: None immediate PROCEDURE: Informed written consent was obtained from the patient after a discussion of the risks, benefits and alternatives to treatment. The patient understands and consents the procedure. A timeout was performed prior to the initiation of the procedure. Ultrasound scanning was performed of the bilateral flanks.  The inferior pole of the right kidney was selected for biopsy due to location and sonographic window. The procedure was planned. The operative site was prepped and draped in the usual sterile fashion. The overlying soft tissues were anesthetized with 1% lidocaine with epinephrine. A 17 gauge core needle biopsy device was advanced into the inferior cortex of the right kidney and 2 core biopsies were obtained under direct ultrasound guidance. Images were saved for documentation purposes. The biopsy device was removed and hemostasis was obtained with manual compression. Post procedural scanning was negative for significant post procedural hemorrhage or additional complication. A dressing was placed. The patient tolerated the procedure well without immediate post procedural complication. IMPRESSION: Technically successful ultrasound guided right renal biopsy. Electronically Signed   By: Jacqulynn Cadet M.D.   On: 12/26/2020 14:14   VAS [place] UPPER EXTREMITY VENOUS DUPLEX  Result Date: 01/21/2021 UPPER VENOUS STUDY  Patient Name:  KAIMANA LURZ  Date of Exam:   01/20/2021 Medical Rec #: [id number]      Accession #:    [id number] Date of Birth: 04/13/1966     Patient Gender: M Patient Age:   54Y Exam Location:  [facility] Procedure:      VAS [place] UPPER EXTREMITY VENOUS DUPLEX Referring Phys: [id number] A CALDWELL POWELL JR --------------------------------------------------------------------------------  Indications: Swelling, and s/p IV Limitations: Poor ultrasound/tissue interface. Comparison  Study: No prior studies. Performing Technologist: Darlin Coco RDMS,RVT  Examination Guidelines: A complete evaluation includes B-mode imaging, spectral Doppler, color Doppler, and power Doppler as needed of all accessible portions of each vessel. Bilateral testing is considered an integral part of a complete examination. Limited examinations for reoccurring indications may be performed as noted.  Right Findings:  +----------+------------+---------+-----------+----------+-------+ RIGHT     CompressiblePhasicitySpontaneousPropertiesSummary +----------+------------+---------+-----------+----------+-------+ Subclavian               Yes       Yes                      +----------+------------+---------+-----------+----------+-------+  Left Findings: +----------+------------+---------+-----------+----------+-----------------+ LEFT      CompressiblePhasicitySpontaneousProperties     Summary      +----------+------------+---------+-----------+----------+-----------------+ IJV           Full       Yes       Yes                                +----------+------------+---------+-----------+----------+-----------------+ Subclavian    Full       Yes       Yes                                +----------+------------+---------+-----------+----------+-----------------+ Axillary      Full       Yes       Yes                                +----------+------------+---------+-----------+----------+-----------------+ Brachial      Full                                                    +----------+------------+---------+-----------+----------+-----------------+ Radial        Full                                                    +----------+------------+---------+-----------+----------+-----------------+ Ulnar         Full                                                    +----------+------------+---------+-----------+----------+-----------------+ Cephalic    Partial                                 Age Indeterminate +----------+------------+---------+-----------+----------+-----------------+ Basilic                                              Not visualized   +----------+------------+---------+-----------+----------+-----------------+  Summary:  Right: No evidence of thrombosis in the subclavian.  Left: No evidence of deep vein thrombosis in the upper extremity.  Findings consistent with age indeterminate superficial vein  thrombosis involving the left cephalic vein. Unable to visualize the basilic vein.  *See table(s) above for measurements and observations.  Diagnosing physician: Ruta Hinds MD Electronically signed by Ruta Hinds MD on 01/21/2021 at 4:17:44 PM.    Final     ASSESSMENT AND PLAN: 1. 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, unknown primary tumor site  CT brain 09/14/2020-multiple hemorrhagic metastases with surrounding edema in the bilateral cerebral hemispheres  CTs chest, abdomen, pelvis 09/14/2020-1.4 cm right upper lobe subpleural nodule, 0.5 cm right lower lobe nodule, multiple hypodensities in the liver suspicious for metastases, 1.5 cm right upper pole hypodense kidney lesion-indeterminate  MRI abdomen 09/15/2020-multifocal T1 hyperintense liver lesions, no kidney mass  Ultrasound-guided biopsy of a segment 4B liver lesion 09/18/2020-benign liver parenchyma with steatosis, no evidence of malignancy  PET 09/25/2020-focal area of hypermetabolism in the right hilum, multiple areas of the liver, pelvic musculature, and left thigh-no CT correlate with any of these areas on the noncontrast CT, 10 mm anterior right upper lobe nodule with low-level hypermetabolism, additional tiny pulmonary nodules too small to characterize by PET  SRS to 10 brain lesions 09/27/2020  Resection of left occipital and left frontal lesions on 09/29/2020-pathology consistent with malignant melanoma  Cycle 1 ipilimumab/nivolumab 10/16/2020  CT of the brain without contrast 10/26/2020 showed significant interval increase in the number and size of numerous metastases  Brain MRI 10/24/2020-multiple new and increased brain metastases, moderate edema surrounding multiple lesions, new mass-effect at the right lateral ventricle  Brain MRI 11/01/2020-stable appearance of his multiple intracranial metastases and associated edema and mild  mass-effect.  CT of the chest with contrast 11/01/2020-interval progression of bilateral pulmonary nodules concerning for metastatic disease, small right hilar lymph nodes, new and progressive ill-defined lesions in the liver compatible with progression of metastatic involvement.  Cycle 2 nivolumab 11/10/2020  Cycle 3 nivolumab 11/27/2020  MRI brain 12/13/2020-decrease in size of hemorrhagic brain metastases, improvement in vasogenic edema and mass-effect, new areas of nonhemorrhagic enhancement by frontal and right parietal white matter-nonspecific  CT chest 12/08/2020-decreased size of lung and liver metastases 2.Left-sided weakness, new onset seizure secondary to #1, weakness has resolved 3.Hyperlipidemia 4.Elevated liver enzymes and bilirubin  Liver enzymes persistently elevated 5.Headache and nausea/vomiting secondary to #1, resolved 6. Hospital admission 10/20/2020-encephalopathy, slow Decadron taper-down to 2 mg at discharge 11/09/2020 7.  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 [facility] admission 01/13/2021- sepsis of unclear source, AKI 9.  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 Swab appears appears unchanged.  He remains confused.  He has been started on Seroquel due to increased agitation.  Dose is being adjusted per hospitalist.  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  He is receiving his home dose of prednisone.  He should continue this.  Will reach out to the patient's wife to further discuss his care.  Will again discuss recommendation for focusing on comfort and enrolling in hospice.  Recommendations: 1.  Continue seizure medication. 2.  Continue prednisone,  to new taper 3.  We will again update the patient's wife by telephone today.  Will discuss continued treatment versus focusing on comfort and enroll with hospice.   LOS: 11 days   Mikey Bussing, DNP, AGPCNP-BC, AOCNP 01/25/21 Mr Portela was more lethargic when I saw him at approximately 7:30 AM this morning.  He generally appears unchanged.  The creatinine has been higher for the past 2 days.  This may be related to limited oral intake.  I discussed his current status with Ms. Sarkis.  I explained his poor functional status.  His mental status has not improved and his renal function has worsened over the past few days.  He is not a candidate for further nivolumab.  I recommend hospice care.  She understands and is generally in agreement with a hospice referral.  She would like to consider this option for 1-2 days.  We discussed home with hospice care versus placement.   I was present for greater than 50% of today's visit.  I performed medical decision making.

## 2021-01-26 ENCOUNTER — Inpatient Hospital Stay: Payer: 59

## 2021-01-26 ENCOUNTER — Inpatient Hospital Stay: Payer: 59 | Admitting: Oncology

## 2021-01-26 DIAGNOSIS — E43 Unspecified severe protein-calorie malnutrition: Secondary | ICD-10-CM | POA: Insufficient documentation

## 2021-01-26 DIAGNOSIS — C7931 Secondary malignant neoplasm of brain: Secondary | ICD-10-CM | POA: Diagnosis not present

## 2021-01-26 LAB — GLUCOSE, CAPILLARY
Glucose-Capillary: 83 mg/dL (ref 70–99)
Glucose-Capillary: 76 mg/dL (ref 70–99)
Glucose-Capillary: 143 mg/dL — ABNORMAL HIGH (ref 70–99)
Glucose-Capillary: 158 mg/dL — ABNORMAL HIGH (ref 70–99)

## 2021-01-26 LAB — BASIC METABOLIC PANEL
Anion gap: 9 (ref 5–15)
BUN: 23 mg/dL — ABNORMAL HIGH (ref 6–20)
CO2: 23 mmol/L (ref 22–32)
Calcium: 8.9 mg/dL (ref 8.9–10.3)
Chloride: 105 mmol/L (ref 98–111)
Creatinine, Ser: 2.83 mg/dL — ABNORMAL HIGH (ref 0.61–1.24)
GFR, Estimated: 25 mL/min — ABNORMAL LOW (ref >60)
Glucose, Bld: 85 mg/dL (ref 70–99)
Potassium: 4.1 mmol/L (ref 3.5–5.1)
Sodium: 137 mmol/L (ref 135–145)

## 2021-01-26 MED ORDER — ENSURE ENLIVE PO LIQD
237.0000 mL | Freq: Three times a day (TID) | ORAL | Status: DC
  Administered 2021-01-26 – 2021-01-29 (×7): 237 mL via ORAL

## 2021-01-26 MED ORDER — PROSOURCE PLUS PO LIQD
30.0000 mL | Freq: Two times a day (BID) | ORAL | Status: DC
  Administered 2021-01-26 – 2021-01-29 (×5): 30 mL via ORAL
  Filled 2021-01-26 (×4): qty 30

## 2021-01-26 MED ORDER — QUETIAPINE FUMARATE 100 MG PO TABS
100.0000 mg | ORAL_TABLET | Freq: Two times a day (BID) | ORAL | Status: DC
  Administered 2021-01-26 – 2021-01-29 (×7): 100 mg via ORAL
  Filled 2021-01-26 (×7): qty 1

## 2021-01-26 MED ORDER — ADULT MULTIVITAMIN W/MINERALS CH
1.0000 | ORAL_TABLET | Freq: Every day | ORAL | Status: DC
  Administered 2021-01-26: 1 via ORAL
  Filled 2021-01-26: qty 1

## 2021-01-26 NOTE — Plan of Care (Signed)
  Problem: Fluid Volume: Goal: Hemodynamic stability will improve Outcome: Progressing   Problem: Clinical Measurements: Goal: Diagnostic test results will improve Outcome: Progressing Goal: Signs and symptoms of infection will decrease Outcome: Progressing   Problem: Respiratory: Goal: Ability to maintain adequate ventilation will improve Outcome: Progressing   Problem: Education: Goal: Knowledge of General Education information will improve Description: Including pain rating scale, medication(s)/side effects and non-pharmacologic comfort measures Outcome: Progressing   Problem: Health Behavior/Discharge Planning: Goal: Ability to manage health-related needs will improve Outcome: Progressing   Problem: Clinical Measurements: Goal: Ability to maintain clinical measurements within normal limits will improve Outcome: Progressing Goal: Will remain free from infection Outcome: Progressing Goal: Diagnostic test results will improve Outcome: Progressing Goal: Respiratory complications will improve Outcome: Progressing Goal: Cardiovascular complication will be avoided Outcome: Progressing   Problem: Activity: Goal: Risk for activity intolerance will decrease Outcome: Progressing   Problem: Nutrition: Goal: Adequate nutrition will be maintained Outcome: Progressing   Problem: Coping: Goal: Level of anxiety will decrease Outcome: Progressing   Problem: Elimination: Goal: Will not experience complications related to bowel motility Outcome: Progressing Goal: Will not experience complications related to urinary retention Outcome: Progressing   Problem: Pain Managment: Goal: General experience of comfort will improve Outcome: Progressing   Problem: Safety: Goal: Ability to remain free from injury will improve Outcome: Progressing   Problem: Skin Integrity: Goal: Risk for impaired skin integrity will decrease Outcome: Progressing   Problem: Safety: Goal:  Non-violent Restraint(s) Outcome: Progressing   Problem: Safety: Goal: Non-violent Restraint(s) Outcome: Progressing   

## 2021-01-26 NOTE — Progress Notes (Addendum)
HEMATOLOGY-ONCOLOGY PROGRESS NOTE  SUBJECTIVE: The patient is laying in bed.  No complaints offered.  Oncology History  Melanoma of skin ([place])  10/05/2020 Initial Diagnosis   Melanoma of skin ([place])   10/05/2020 Cancer Staging   Staging form: Melanoma of the Skin, AJCC 8th Edition - Pathologic: Stage IV (pTX, pNX, pM1) - Signed by [name], MD on 10/05/2020   10/16/2020 -  Chemotherapy    Patient is on Treatment Plan: MELANOMA NIVOLUMAB + IPILIMUMAB (1/3) Q21D / NIVOLUMAB Q14D       PHYSICAL EXAMINATION:  Vitals:   01/26/21 0257 01/26/21 0621  BP: 96/62 99/71  Pulse: 75 77  Resp:  17  Temp:  98.1 F (36.7 C)  SpO2: 97% 98%   Filed Weights   01/13/21 2212  Weight: 77.1 kg    Intake/Output from previous day: No intake/output data recorded.  GENERAL: Awake and alert, no distress SKIN: skin color, texture, decreased turgor, no rashes or significant lesions EYES: Oral mucosa is dry, no mucositis OROPHARYNX:no exudate, no erythema and lips, buccal mucosa, and tongue normal  LUNGS: clear to auscultation and percussion with normal breathing effort HEART: regular rate & rhythm and no murmurs and no lower extremity edema ABDOMEN:abdomen soft, non-tender and normal bowel sounds NEURO: Alert, confused, follows commands, moves all extremities to command  LABORATORY DATA:  I have reviewed the data as listed CMP Latest Ref Rng & Units 01/26/2021 01/24/2021 01/23/2021  Glucose 70 - 99 mg/dL 85 86 81  BUN 6 - 20 mg/dL 23(H) 17 14  Creatinine 0.61 - 1.24 mg/dL 2.83(H) 1.65(H) 1.32(H)  Sodium 135 - 145 mmol/L 137 138 139  Potassium 3.5 - 5.1 mmol/L 4.1 3.9 3.9  Chloride 98 - 111 mmol/L 105 107 107  CO2 22 - 32 mmol/L 23 24 24   Calcium 8.9 - 10.3 mg/dL 8.9 8.7(L) 8.8(L)  Total Protein 6.5 - 8.1 g/dL - 5.3(L) 5.5(L)  Total Bilirubin 0.3 - 1.2 mg/dL - 0.8 0.8  Alkaline Phos 38 - 126 U/L - 71 74  AST 15 - 41 U/L - 16 21  ALT 0 - 44 U/L - 25 32    Lab Results  Component  Value Date   WBC 6.0 01/24/2021   HGB 11.0 (L) 01/24/2021   HCT 32.8 (L) 01/24/2021   MCV 89.9 01/24/2021   PLT 228 01/24/2021   NEUTROABS 4.0 01/24/2021    CT Head Wo Contrast  Result Date: 01/13/2021 CLINICAL DATA:  Initial evaluation for acute delirium. EXAM: CT HEAD WITHOUT CONTRAST TECHNIQUE: Contiguous axial images were obtained from the base of the skull through the vertex without intravenous contrast. COMPARISON:  Recent CT from 12/18/2020. FINDINGS: Brain: Again seen are multiple hemorrhagic metastases scattered throughout both cerebral hemispheres. Overall, lesions are not appreciably changed or progressed as compared to most recent CT. Most prominent of these lesions positioned at the left parietal lobe in measures approximately 1.8 cm. Localized vasogenic edema about the preponderance of these lesions without midline shift or significant regional mass effect. No evidence for new/interval hemorrhage by CT. No appreciable infratentorial involvement. No acute large vessel territory infarct. Ventricles stable in size without hydrocephalus. No extra-axial fluid collection. Vascular: No hyperdense vessel. Skull: Scalp soft tissues demonstrate no acute finding. Post craniotomy [name] is noted at the left calvarium. Sinuses/Orbits: Globes and orbital soft tissues demonstrate no acute finding. Visualized paranasal sinuses are clear. No mastoid effusion. Other: None. IMPRESSION: 1. No significant interval change in size and appearance of multiple hemorrhagic metastases scattered  throughout both cerebral hemispheres, with similar localized vasogenic edema. No midline shift or significant regional mass effect. 2. No other acute intracranial abnormality. Electronically Signed   By: Jeannine Boga M.D.   On: 01/13/2021 23:16   DG CHEST PORT 1 VIEW  Result Date: 01/22/2021 CLINICAL DATA:  Inpatient, concern for aspiration, confusion EXAM: PORTABLE CHEST 1 VIEW COMPARISON:  01/13/2021 chest  radiograph. FINDINGS: Stable cardiomediastinal silhouette with normal heart size. No pneumothorax. No pleural effusion. Lungs appear clear, with no acute consolidative airspace disease and no pulmonary edema. IMPRESSION: No active disease. Electronically Signed   By: Ilona Sorrel M.D.   On: 01/22/2021 11:07   DG Chest Port 1 View  Result Date: 01/13/2021 CLINICAL DATA:  Unresponsive EXAM: PORTABLE CHEST 1 VIEW COMPARISON:  12/18/2020 FINDINGS: The heart size and mediastinal contours are within normal limits. Both lungs are clear. The visualized skeletal structures are unremarkable. The lung volumes are low. These low lung volumes likely accentuate the upper mediastinum. IMPRESSION: Low lung volumes.  Otherwise, stable exam. Electronically Signed   By: Constance Holster M.D.   On: 01/13/2021 23:05   VAS [place] UPPER EXTREMITY VENOUS DUPLEX  Result Date: 01/21/2021 UPPER VENOUS STUDY  Patient Name:  JANOS SHAMPINE  Date of Exam:   01/20/2021 Medical Rec #: [id number]      Accession #:    [id number] Date of Birth: Nov 12, 1965     Patient Gender: M Patient Age:   54Y Exam Location:  [facility] Procedure:      VAS [place] UPPER EXTREMITY VENOUS DUPLEX Referring Phys: [id number] A CALDWELL POWELL JR --------------------------------------------------------------------------------  Indications: Swelling, and s/p IV Limitations: Poor ultrasound/tissue interface. Comparison Study: No prior studies. Performing Technologist: Darlin Coco RDMS,RVT  Examination Guidelines: A complete evaluation includes B-mode imaging, spectral Doppler, color Doppler, and power Doppler as needed of all accessible portions of each vessel. Bilateral testing is considered an integral part of a complete examination. Limited examinations for reoccurring indications may be performed as noted.  Right Findings: +----------+------------+---------+-----------+----------+-------+ RIGHT     CompressiblePhasicitySpontaneousPropertiesSummary  +----------+------------+---------+-----------+----------+-------+ Subclavian               Yes       Yes                      +----------+------------+---------+-----------+----------+-------+  Left Findings: +----------+------------+---------+-----------+----------+-----------------+ LEFT      CompressiblePhasicitySpontaneousProperties     Summary      +----------+------------+---------+-----------+----------+-----------------+ IJV           Full       Yes       Yes                                +----------+------------+---------+-----------+----------+-----------------+ Subclavian    Full       Yes       Yes                                +----------+------------+---------+-----------+----------+-----------------+ Axillary      Full       Yes       Yes                                +----------+------------+---------+-----------+----------+-----------------+ Brachial      Full                                                    +----------+------------+---------+-----------+----------+-----------------+  Radial        Full                                                    +----------+------------+---------+-----------+----------+-----------------+ Ulnar         Full                                                    +----------+------------+---------+-----------+----------+-----------------+ Cephalic    Partial                                 Age Indeterminate +----------+------------+---------+-----------+----------+-----------------+ Basilic                                              Not visualized   +----------+------------+---------+-----------+----------+-----------------+  Summary:  Right: No evidence of thrombosis in the subclavian.  Left: No evidence of deep vein thrombosis in the upper extremity. Findings consistent with age indeterminate superficial vein thrombosis involving the left cephalic vein. Unable to visualize the basilic  vein.  *See table(s) above for measurements and observations.  Diagnosing physician: Ruta Hinds MD Electronically signed by Ruta Hinds MD on 01/21/2021 at 4:17:44 PM.    Final     ASSESSMENT AND PLAN: 1. 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, unknown primary tumor site  CT brain 09/14/2020-multiple hemorrhagic metastases with surrounding edema in the bilateral cerebral hemispheres  CTs chest, abdomen, pelvis 09/14/2020-1.4 cm right upper lobe subpleural nodule, 0.5 cm right lower lobe nodule, multiple hypodensities in the liver suspicious for metastases, 1.5 cm right upper pole hypodense kidney lesion-indeterminate  MRI abdomen 09/15/2020-multifocal T1 hyperintense liver lesions, no kidney mass  Ultrasound-guided biopsy of a segment 4B liver lesion 09/18/2020-benign liver parenchyma with steatosis, no evidence of malignancy  PET 09/25/2020-focal area of hypermetabolism in the right hilum, multiple areas of the liver, pelvic musculature, and left thigh-no CT correlate with any of these areas on the noncontrast CT, 10 mm anterior right upper lobe nodule with low-level hypermetabolism, additional tiny pulmonary nodules too small to characterize by PET  SRS to 10 brain lesions 09/27/2020  Resection of left occipital and left frontal lesions on 09/29/2020-pathology consistent with malignant melanoma  Cycle 1 ipilimumab/nivolumab 10/16/2020  CT of the brain without contrast 10/26/2020 showed significant interval increase in the number and size of numerous metastases  Brain MRI 10/24/2020-multiple new and increased brain metastases, moderate edema surrounding multiple lesions, new mass-effect at the right lateral ventricle  Brain MRI 11/01/2020-stable appearance of his multiple intracranial metastases and associated edema and mild mass-effect.  CT of the chest with contrast 11/01/2020-interval progression of bilateral pulmonary nodules concerning for metastatic  disease, small right hilar lymph nodes, new and progressive ill-defined lesions in the liver compatible with progression of metastatic involvement.  Cycle 2 nivolumab 11/10/2020  Cycle 3 nivolumab 11/27/2020  MRI brain 12/13/2020-decrease in size of hemorrhagic brain metastases, improvement in vasogenic edema and mass-effect, new areas of nonhemorrhagic enhancement by frontal and right parietal white matter-nonspecific  CT chest 12/08/2020-decreased size of lung  and liver metastases 2.Left-sided weakness, new onset seizure secondary to #1, weakness has resolved 3.Hyperlipidemia 4.Elevated liver enzymes and bilirubin  Liver enzymes persistently elevated 5.Headache and nausea/vomiting secondary to #1, resolved 6. Hospital admission 10/20/2020-encephalopathy, slow Decadron taper-down to 2 mg at discharge 11/09/2020 7.  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 [facility] admission 01/13/2021- sepsis of unclear source, AKI 9.  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 Lucchesi appears appears unchanged.  He remains confused.  He has been started on Seroquel due to increased agitation.  Dose is being adjusted per hospitalist.  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  He continues prednisone.  His creatinine is rising and he appears to be dehydrated on exam.  The patient may benefit from IV fluids.  Discussed his care with his wife over the telephone yesterday.  Explained poor functional status.  He is not a candidate for additional nivolumab.  We have recommended hospice care.  She understands and is generally in agreement with a hospice referral but would like a few more days to think about this  option.  We also discussed hospice care versus placement.  Recommendations: 1.  Continue seizure medication. 2.  Continue prednisone 3.  Consider trial of short-term intravenous hydration as the progressive rise in the creatinine may be related to post renal failure diuresis 4.  Hospice referral for residential hospice 5.  Please call Oncology over the weekend as needed.   LOS: 12 days   Mikey Bussing, DNP, AGPCNP-BC, AOCNP 01/26/21 Mr. Cockerham was more alert when I saw him this morning.  He remains confused.  The creatinine is higher.  I suspect he is dehydrated secondary to lack of oral intake and potentially post renal failure diuresis.  I discussed the case with his mother, Ms. Pearline Cables and his wife separately by telephone.  They understand the poor prognosis.  His mother indicates that she and his siblings are in agreement with hospice/comfort care.  Ms. Chalker is also in agreement with a transition to hospice care.  They understand his prognosis will likely be measured in days to weeks.  They agree to not initiate long-term intravenous hydration or artificial nutrition.  I will make a referral to [facility] to consider transfer to [facility].  I was present for greater than 50% of today's visit.  I performed medical decision making.

## 2021-01-26 NOTE — Progress Notes (Signed)
Initial Nutrition Assessment  DOCUMENTATION CODES:  Severe malnutrition in context of chronic illness  INTERVENTION:  Reweigh pt when possible.  Continue diet order per SLP.  Add Ensure Enlive po TID, each supplement provides 350 kcal and 20 grams of protein.  Add Magic cup TID with meals, each supplement provides 290 kcal and 9 grams of protein.  Add 30 ml ProSource Plus po BID, each supplement provides 100 kcal and 15 grams of protein.   Add MVI with minerals daily.  NUTRITION DIAGNOSIS:  Severe Malnutrition related to chronic illness,cancer and cancer related treatments as evidenced by moderate fat depletion,severe fat depletion,moderate muscle depletion,severe muscle depletion.  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 5/9 -SLP recommended Dys 2 with thins  Pt a little confused during visit today. He reports eating well here and at home, liking the ice cream specifically. He reports drinking the Ensures regularly. However, pt's meals with limited documentation, but he is eating mostly 0% of the ones that are documented.  Pt denies any significant weight loss. Pt's weight has stayed stable the past 2 months. However, pt has not been weighed since admission. Recommend reweighing pt.   However, on exam, pt's depletions have gone from moderate to severe since RD last saw pt about a month ago.  Recommend continuing Ensure but TID, Magic Cup TID, ProSource Plus BID, and adding MVI with minerals daily to promote caloric and protein intake.  Medications: reviewed; Ensure Enlive BID,  Keppra, Mag-Ox, Protonix, Klor-Con 40 mEq, prednisone, Senokot  Labs: reviewed; CBG 81-122  NUTRITION - FOCUSED PHYSICAL EXAM: Flowsheet Row Most Recent Value  Orbital Region Severe depletion  Upper Arm Region Severe depletion  Thoracic and Lumbar Region Mild depletion  Buccal Region Severe depletion  Temple Region Severe depletion  Clavicle Bone Region Moderate depletion  Clavicle and Acromion Bone Region Moderate depletion  Scapular Bone Region Moderate depletion  Dorsal Hand Severe depletion  Patellar Region Severe depletion  Anterior Thigh Region Severe depletion  Posterior Calf Region Severe depletion  Edema (RD Assessment) None  Hair Reviewed  Eyes Reviewed  Mouth Reviewed  Skin Reviewed  Nails Reviewed     Diet Order:   Diet Order            DIET DYS 2 Room service appropriate? Yes; Fluid consistency: Thin  Diet effective now                EDUCATION NEEDS:  Education needs have been addressed  Skin:  Skin Assessment: Reviewed RN Assessment  Last BM:  01/18/21, Type 7, large amount  Height:  Ht Readings from Last 1 Encounters:  01/13/21 6' (1.829 m)   Weight:  Wt Readings from Last 1 Encounters:  01/13/21 77.1 kg   Ideal Body Weight:  81 kg  BMI:  Body mass index is 23.06 kg/m.  Estimated Nutritional Needs:  Kcal:  2400-2600 Protein:  110-125 grams Fluid:  >2 L  Sean [name], RD, LDN Registered Dietitian After Hours/Weekend Pager # in [place]

## 2021-01-26 NOTE — TOC Progression Note (Signed)
Transition of Care [facility]) - Progression Note    Patient Details  Name: Sean [name] MRN: [id number] Date of Birth: 10-15-1965  Transition of Care [facility]) CM/SW [place], [place] Phone Number: 01/26/2021, 2:01 PM  Clinical Narrative:  Called Mr Primus Bravo at [facility] to find out specifics about cost, if any,  of patient returning with hospice care. Was informed that leadership had made a decision earlier today that they would not take him back. Asked him for specifics, and he referred me to leadership.  Asked that he inform wife so I can follow up with her, and was informed by Mr Primus Bravo that he would not call her until Monday when the Director of Nursing has returned. MD informed. TOC will continue to follow during the course of hospitalization.     Expected Discharge Plan: [place] Barriers to Discharge: Continued Medical Work up  Expected Discharge Plan and Services Expected Discharge Plan: [place]   Discharge Planning Services: CM Consult   Living arrangements for the past 2 months: [place]                                       Social Determinants of Health (SDOH) Interventions    Readmission Risk Interventions No flowsheet data found.

## 2021-01-26 NOTE — Progress Notes (Signed)
Manufacturing engineer [facility]) Hospital Liaison note.     Received request from Dr. Loleta Books to evaluate pt for hosice and for inpatient hospice eligibility.  TOC Rodney made aware.  Chart and pt information under review by [facility] physician.  Hospice eligibility pending at this time.  Thank you for the opportunity to participate in this patient's care.  Domenic Moras, BSN, RN [facility] Liaison (listed on [place] under Hospice/[facility])    [phone] (24h on call)

## 2021-01-26 NOTE — Progress Notes (Signed)
[facility] PROGRESS NOTE    Sean [name]  VVO:[id number] DOB: 11-16-1965 DOA: 01/13/2021 PCP: Sean Pier, MD      Brief Narrative:  [name] is a 55 y.o. M with metastatic melanoma, metastatic to brain who presented with being found unresponsive in his wheelchair.  Diagnosed with metastatic melanoma few months ago,metastatic to brain, liver and lungs, unknown primary, underwent radiation and PDL1 inhib.    Developed AIN from [place] as well as some hemorrhage of brain mets; PDL1/niv/ip therapy held.  Mentation worsened and patient had transitioned to [place] for rehab.  On the day of admission, found unresponsive there.  Brought to ER, where he was febrile, to 103F, tachycardic, WBC 17.  CXR with low volumes, presumably pneumonia.  Started on antibiotics and admitted.         Assessment & Plan:  Acute metabolic encephalopathy resolved Chronic cognitive impairment, due to progressive brain metastases  Patient now worsening.  Oncology have been following closely.  With patient's worsening functional status and worsening confusion and somnolence, it has become clear that further treatment is exceedingly unlikely to help him return to a level of function with valuable quality of life.    THis was discussed with wife/POA, Sean [name], who understands.  Going forward, treatments will focus on comfort and dignity.    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 This may be dehydration, it may be DI given brain mets.  Long discussion with dr.  Benay [name], wife, mother, at present, analysis and IV fluid treatment of his renal failure would only prolong him in his current state, which would not have been his wishes.  -Transition to Hospice -Liberalize diet  Hypomagnesemia Hypokalemia  Anemia of chronic disease Hemoglobin stable relative to baseline        Disposition: Status is: Inpatient  Remains inpatient appropriate because:Unsafe d/c plan  His renal function is worsening, mentation is not improving.  At this point I think it is increasingly likely that he has a prognosis less than 2 weeks.  We will refer to hospice today, start to explore options of home with hospice, to a facility with hospice, or residential hospice, monitor renal function.    Dispo: The patient is from: SNF              Anticipated d/c is to: SNF              Patient currently is medically stable to d/c.   Difficult to place patient Yes       Level of care: Med-Surg       MDM: The below labs and imaging reports were reviewed and summarized above.  Medication management as above.       DVT prophylaxis: SCDs Start: 01/14/21 0404  Code Status: FULL Family Communication: Wife, mother by phone            Subjective: Fever.  Mentation has not improved.  He is sometimes sleepy, sometimes slightly more alert, but always very confused.  No new complaints of pain.         Objective: Vitals:   01/26/21  6384 01/26/21 0621 01/26/21 1420 01/26/21 1430  BP: 96/62 99/71 98/63    Pulse: 75 77 98 98  Resp:  17    Temp:  98.1 F (36.7 C) 99.6 F (37.6 C)   TempSrc:  Oral Oral   SpO2: 97% 98% 98% 97%  Weight:      Height:       No intake or output data in the 24 hours ending 01/26/21 1532 Filed Weights   01/13/21 2212  Weight: 77.1 kg    Examination: General appearance: Thin adult male, lying in bed, appears debilitated     HEENT:   Temporal wasting, no nasal deformity, discharge, or epistaxis, oropharynx dry. Skin:   Cardiac: RRR, soft systolic murmur, no lower extremity edema Respiratory: Normal respiratory rate and rhythm, lungs clear without rales or wheezes Abdomen: Abdomen soft no tenderness palpation or guarding, no ascites or distention MSK: Diffuse severe loss of subcutaneous muscle mass, fat wasting, temporal wasting. Neuro:    Psych: Attention diminished, and attentive to our conversation, judgment and insight appear impaired           Data Reviewed: I have personally reviewed following labs and imaging studies:  CBC: Recent Labs  Lab 01/20/21 0540 01/21/21 0601 01/22/21 0458 01/23/21 0534 01/24/21 0502  WBC 6.0 6.4 7.2 6.9 6.0  NEUTROABS 3.7 4.1 5.6 4.8 4.0  HGB 12.6* 11.0* 12.1* 11.8* 11.0*  HCT 36.7* 33.2* 35.4* 35.7* 32.8*  MCV 88.2 90.7 89.4 92.0 89.9  PLT 255 241 249 236 665   Basic Metabolic Panel: Recent Labs  Lab 01/21/21 0601 01/22/21 0458 01/23/21 0534 01/24/21 0502 01/26/21 0548  NA 140 139 139 138 137  K 3.4* 4.1 3.9 3.9 4.1  CL 110 107 107 107 105  CO2 22 25 24 24 23   GLUCOSE 78 81 81 86 85  BUN 11 12 14 17  23*  CREATININE 0.80 1.02 1.32* 1.65* 2.83*  CALCIUM 8.5* 8.5* 8.8* 8.7* 8.9  MG 1.4* 1.9 1.7 1.7  --   PHOS 2.6 3.5 3.6 4.3  --    GFR: Estimated Creatinine Clearance: 31.8 mL/min (A) (by C-G formula based on SCr of 2.83 mg/dL (H)). Liver Function Tests: Recent Labs  Lab 01/20/21 1059 01/21/21 0601 01/22/21 0458 01/23/21 0534 01/24/21 0502  AST 35 25 30 21 16   ALT 42 35 38 32 25  ALKPHOS 79 66 74 74 71  BILITOT 0.6 0.5 0.8 0.8 0.8  PROT 6.0* 5.3* 5.6* 5.5* 5.3*  ALBUMIN 3.1* 2.7* 2.9* 3.1* 2.9*   No results for input(s): LIPASE, AMYLASE in the last 168 hours. No results for input(s): AMMONIA in the last 168 hours. Coagulation Profile: No results for input(s): INR, PROTIME in the last 168 hours. Cardiac Enzymes: No results for input(s): CKTOTAL, CKMB, CKMBINDEX, TROPONINI in the last 168 hours. BNP (last 3 results) No results  for input(s): PROBNP in the last 8760 hours. HbA1C: No results for input(s): HGBA1C in the last 72 hours. CBG: Recent Labs  Lab 01/25/21 1108 01/25/21 1653 01/25/21 2318 01/26/21 0623 01/26/21 1206  GLUCAP 81 122* 112* 83 76   Lipid Profile: No results for input(s): CHOL, HDL, LDLCALC, TRIG, CHOLHDL, LDLDIRECT in the last 72 hours. Thyroid Function Tests: No results for input(s): TSH, T4TOTAL, FREET4, T3FREE, THYROIDAB in the last 72 hours. Anemia Panel: No results for input(s): VITAMINB12, FOLATE, FERRITIN, TIBC, IRON, RETICCTPCT in the last 72 hours. Urine analysis:    Component Value Date/Time   COLORURINE YELLOW 01/13/2021 2154   APPEARANCEUR  CLEAR 01/13/2021 2154   LABSPEC 1.011 01/13/2021 2154   PHURINE 5.0 01/13/2021 2154   GLUCOSEU NEGATIVE 01/13/2021 2154   HGBUR NEGATIVE 01/13/2021 2154   BILIRUBINUR NEGATIVE 01/13/2021 2154   KETONESUR 20 (A) 01/13/2021 2154   PROTEINUR NEGATIVE 01/13/2021 2154   NITRITE NEGATIVE 01/13/2021 2154   LEUKOCYTESUR NEGATIVE 01/13/2021 2154   Sepsis Labs: @LABRCNTIP (procalcitonin:4,lacticacidven:4)  ) No results found for this or any previous visit (from the past 240 hour(s)).       Radiology Studies: No results found.      Scheduled Meds: . (feeding supplement) PROSource Plus  30 mL Oral BID BM  . Chlorhexidine Gluconate Cloth  6 each Topical Daily  . citalopram  10 mg Oral QHS  . feeding supplement  237 mL Oral TID BM  . levETIRAcetam  500 mg Oral BID  . magnesium oxide  400 mg Oral Daily  . mouth rinse  15 mL Mouth Rinse BID  . multivitamin with minerals  1 tablet Oral Daily  . pantoprazole  40 mg Oral Daily  . predniSONE  10 mg Oral Q breakfast  . QUEtiapine  100 mg Oral BID  . senna-docusate  1 tablet Oral QHS  . traZODone  25 mg Oral QHS   Continuous Infusions:   LOS: 12 days    Time spent: 25 minutes    Edwin Dada, MD [facility] 01/26/2021, 3:32 PM     Please page though  [place] or Epic secure chat:  For [employer], Adult nurse

## 2021-01-27 LAB — BASIC METABOLIC PANEL
Anion gap: 11 (ref 5–15)
BUN: 26 mg/dL — ABNORMAL HIGH (ref 6–20)
CO2: 22 mmol/L (ref 22–32)
Calcium: 9 mg/dL (ref 8.9–10.3)
Chloride: 105 mmol/L (ref 98–111)
Creatinine, Ser: 3.17 mg/dL — ABNORMAL HIGH (ref 0.61–1.24)
GFR, Estimated: 22 mL/min — ABNORMAL LOW (ref >60)
Glucose, Bld: 89 mg/dL (ref 70–99)
Potassium: 4.6 mmol/L (ref 3.5–5.1)
Sodium: 138 mmol/L (ref 135–145)

## 2021-01-27 LAB — GLUCOSE, CAPILLARY
Glucose-Capillary: 123 mg/dL — ABNORMAL HIGH (ref 70–99)
Glucose-Capillary: 92 mg/dL (ref 70–99)

## 2021-01-27 MED ORDER — HALOPERIDOL LACTATE 5 MG/ML IJ SOLN
0.5000 mg | INTRAMUSCULAR | Status: DC | PRN
  Administered 2021-01-27 – 2021-01-28 (×2): 0.5 mg via INTRAVENOUS
  Filled 2021-01-27 (×2): qty 1

## 2021-01-27 MED ORDER — HALOPERIDOL LACTATE 2 MG/ML PO CONC: 0.5000 mg | ORAL | Status: DC | PRN

## 2021-01-27 MED ORDER — HALOPERIDOL 2 MG PO TABS
2.0000 mg | ORAL_TABLET | ORAL | Status: DC | PRN
  Administered 2021-01-28 – 2021-01-29 (×3): 2 mg via ORAL
  Filled 2021-01-27 (×3): qty 1

## 2021-01-27 NOTE — Progress Notes (Addendum)
Manufacturing engineer [facility] Hospital Liaison note.    Addendum: Spoke by phone with spouse [name] who queried out of pocket cost for [employer] and shared that pt's coverage ends tomorrow, per her understanding.  She shared that she is in the process of adding pt to her policy.  This RN advised TOC at [facility] may be better positioned to advise her on how to cover gaps in coverage and that an insurance specialist at [facility] would review out of pocket expenses for [facility] and reach out to her on Monday once she is back in the office.  Denise verbalized appreciation.   Chart and pt information have been reviewed by [facility] physician.  Hospice eligibility confirmed.  [facility] is unable to offer a room today. Hospital Liaison will follow up tomorrow or sooner if a room becomes available. Please do not hesitate to call with questions.    Thank you for the opportunity to participate in this patient's care.  Domenic Moras, BSN, RN [facility] Liaison (listed on [place] under Hospice/[facility])    [phone] (24h on call)

## 2021-01-27 NOTE — TOC Progression Note (Signed)
Transition of Care [facility]) - Progression Note    Patient Details  Name: Sean [name] MRN: [id number] Date of Birth: Jun 13, 1966  Transition of Care [facility]) CM/SW Why, [place] Phone Number: 01/27/2021, 10:13 AM  Clinical Narrative:   Received message from [facility], [place] with [facility] last night at [facility], [id number], stating they will take patient back if bed is still needed. TOC will continue to follow during the course of hospitalization.     Expected Discharge Plan: [place] Barriers to Discharge: Continued Medical Work up  Expected Discharge Plan and Services Expected Discharge Plan: [place]   Discharge Planning Services: CM Consult   Living arrangements for the past 2 months: [place]                                       Social Determinants of Health (SDOH) Interventions    Readmission Risk Interventions No flowsheet data found.

## 2021-01-27 NOTE — Progress Notes (Signed)
[facility] PROGRESS NOTE    Sean [name]  EPP:[id number] DOB: 1966-06-12 DOA: 01/13/2021 PCP: [name], MD      Brief Narrative:  Sean [name] is a 55 y.o. M with metastatic melanoma, metastatic to brain who presented with being found unresponsive in his wheelchair.          Assessment & Plan:  Cognitive impairment due to brain mets  Now worsening acute metabolic encephalopathy due to AKI  -Continue Seroquel -Add PRN Haldol for agitation      Metastatic melanoma   -Continue Keppra   Severe protein calorie malnutrition due to cancer   Acute kidney injury Creatinine today >3.   Patient eating very little. -Comfort measures -Liberalize diet - I suspect life expectancy is <14 days, would recommend residential hospice if available        Disposition: Status is: Inpatient  Remains inpatient appropriate because:Unsafe d/c plan  His renal function is worsening, mentation is not improving.  At this point I think it is increasingly likely that he has a prognosis less than 2 weeks.   He is entering the active dying proces.    Dispo: The patient is from: SNF              Anticipated d/c is to: SNF              Patient currently is medically stable to d/c.   Difficult to place patient Yes       Level of care: Med-Surg       MDM: The below labs and imaging reports were reviewed and summarized above.  Medication management as above.       DVT prophylaxis: SCDs Start: 01/14/21 0404  Code Status: FULL Family Communication: Wife by phone            Subjective: No new fever, sleepy, intermittently agitated, no pain complaints.  No seizures.  No respiratory distress.        Objective: Vitals:   01/26/21 1420 01/26/21 1430 01/27/21 0500 01/27/21 1259  BP: 98/63   105/66  Pulse: 98 98  99  Resp:    16  Temp: 99.6 F (37.6 C)   98.2 F (36.8 C)  TempSrc: Oral   Oral  SpO2: 98% 97%  97%  Weight:   68.7  kg   Height:        Intake/Output Summary (Last 24 hours) at 01/27/2021 1619 Last data filed at 01/26/2021 1925 Gross per 24 hour  Intake --  Output 650 ml  Net -650 ml   Filed Weights   01/13/21 2212 01/27/21 0500  Weight: 77.1 kg 68.7 kg    Examination: General appearance: Thin adult male, sitting in recliner, debilitated     HEENT: No nasal deformity Skin: No rashes or lesions Cardiac: RRR, no murmurs appreciated today, no lower extremity edema Respiratory: Respiratory effort normal, no rales or wheezes Abdomen:   MSK: Severe diffuse loss of subcutaneous muscle mass and at Neuro: Awake, makes eye contact, responds to questions, severe generalized weakness, strength symmetric, face symmetric, speech slow but fluent Psych: Attention diminished, affect blunted, psychomotor slowing noted, disoriented          Data Reviewed: I have personally reviewed following labs and imaging studies:  CBC: Recent Labs  Lab 01/21/21 0601 01/22/21 0458 01/23/21 0534 01/24/21 0502  WBC 6.4 7.2 6.9 6.0  NEUTROABS 4.1 5.6 4.8 4.0  HGB 11.0* 12.1* 11.8* 11.0*  HCT 33.2* 35.4* 35.7* 32.8*  MCV  90.7 89.4 92.0 89.9  PLT 241 249 236 443   Basic Metabolic Panel: Recent Labs  Lab 01/21/21 0601 01/22/21 0458 01/23/21 0534 01/24/21 0502 01/26/21 0548 01/27/21 0541  NA 140 139 139 138 137 138  K 3.4* 4.1 3.9 3.9 4.1 4.6  CL 110 107 107 107 105 105  CO2 22 25 24 24 23 22   GLUCOSE 78 81 81 86 85 89  BUN 11 12 14 17  23* 26*  CREATININE 0.80 1.02 1.32* 1.65* 2.83* 3.17*  CALCIUM 8.5* 8.5* 8.8* 8.7* 8.9 9.0  MG 1.4* 1.9 1.7 1.7  --   --   PHOS 2.6 3.5 3.6 4.3  --   --    GFR: Estimated Creatinine Clearance: 25.3 mL/min (A) (by C-G formula based on SCr of 3.17 mg/dL (H)). Liver Function Tests: Recent Labs  Lab 01/21/21 0601 01/22/21 0458 01/23/21 0534 01/24/21 0502  AST 25 30 21 16   ALT 35 38 32 25  ALKPHOS 66 74 74 71  BILITOT 0.5 0.8 0.8 0.8  PROT 5.3* 5.6* 5.5* 5.3*   ALBUMIN 2.7* 2.9* 3.1* 2.9*   No results for input(s): LIPASE, AMYLASE in the last 168 hours. No results for input(s): AMMONIA in the last 168 hours. Coagulation Profile: No results for input(s): INR, PROTIME in the last 168 hours. Cardiac Enzymes: No results for input(s): CKTOTAL, CKMB, CKMBINDEX, TROPONINI in the last 168 hours. BNP (last 3 results) No results for input(s): PROBNP in the last 8760 hours. HbA1C: No results for input(s): HGBA1C in the last 72 hours. CBG: Recent Labs  Lab 01/26/21 1206 01/26/21 1719 01/26/21 2219 01/27/21 0527 01/27/21 1157  GLUCAP 76 143* 158* 123* 92   Lipid Profile: No results for input(s): CHOL, HDL, LDLCALC, TRIG, CHOLHDL, LDLDIRECT in the last 72 hours. Thyroid Function Tests: No results for input(s): TSH, T4TOTAL, FREET4, T3FREE, THYROIDAB in the last 72 hours. Anemia Panel: No results for input(s): VITAMINB12, FOLATE, FERRITIN, TIBC, IRON, RETICCTPCT in the last 72 hours. Urine analysis:    Component Value Date/Time   COLORURINE YELLOW 01/13/2021 2154   APPEARANCEUR CLEAR 01/13/2021 2154   LABSPEC 1.011 01/13/2021 2154   PHURINE 5.0 01/13/2021 2154   GLUCOSEU NEGATIVE 01/13/2021 2154   HGBUR NEGATIVE 01/13/2021 2154   BILIRUBINUR NEGATIVE 01/13/2021 2154   KETONESUR 20 (A) 01/13/2021 2154   PROTEINUR NEGATIVE 01/13/2021 2154   NITRITE NEGATIVE 01/13/2021 2154   LEUKOCYTESUR NEGATIVE 01/13/2021 2154   Sepsis Labs: @LABRCNTIP (procalcitonin:4,lacticacidven:4)  ) No results found for this or any previous visit (from the past 240 hour(s)).       Radiology Studies: No results found.      Scheduled Meds: . (feeding supplement) PROSource Plus  30 mL Oral BID BM  . Chlorhexidine Gluconate Cloth  6 each Topical Daily  . feeding supplement  237 mL Oral TID BM  . levETIRAcetam  500 mg Oral BID  . mouth rinse  15 mL Mouth Rinse BID  . predniSONE  10 mg Oral Q breakfast  . QUEtiapine  100 mg Oral BID  . senna-docusate  1  tablet Oral QHS  . traZODone  25 mg Oral QHS   Continuous Infusions:   LOS: 13 days    Time spent: 25 minutes    Edwin Dada, MD [facility] 01/27/2021, 4:19 PM     Please page though [place] or Epic secure chat:  For [employer], Adult nurse

## 2021-01-28 LAB — BASIC METABOLIC PANEL
Anion gap: 10 (ref 5–15)
BUN: 27 mg/dL — ABNORMAL HIGH (ref 6–20)
CO2: 24 mmol/L (ref 22–32)
Calcium: 9.1 mg/dL (ref 8.9–10.3)
Chloride: 104 mmol/L (ref 98–111)
Creatinine, Ser: 3.41 mg/dL — ABNORMAL HIGH (ref 0.61–1.24)
GFR, Estimated: 20 mL/min — ABNORMAL LOW (ref >60)
Glucose, Bld: 87 mg/dL (ref 70–99)
Potassium: 4.3 mmol/L (ref 3.5–5.1)
Sodium: 138 mmol/L (ref 135–145)

## 2021-01-28 LAB — GLUCOSE, CAPILLARY
Glucose-Capillary: 108 mg/dL — ABNORMAL HIGH (ref 70–99)
Glucose-Capillary: 93 mg/dL (ref 70–99)

## 2021-01-28 MED ORDER — HYDROMORPHONE HCL 1 MG/ML IJ SOLN: 0.5000 mg | INTRAMUSCULAR | Status: DC | PRN

## 2021-01-28 MED ORDER — OXYCODONE HCL 5 MG PO TABS
5.0000 mg | ORAL_TABLET | ORAL | Status: DC | PRN
  Administered 2021-01-28 (×2): 5 mg via ORAL
  Filled 2021-01-28 (×2): qty 1

## 2021-01-28 NOTE — Progress Notes (Addendum)
[facility] ([facility])  There is not a bed to offer Mr. Atayde at [facility] today, but Mr. Stemmer is approved for residential hospice.   [facility] will update staff and family once a bed is open.  Venia Carbon RN, BSN, [facility] Liaison

## 2021-01-28 NOTE — Progress Notes (Signed)
[facility] PROGRESS NOTE    Sean [name]  KYH:[id number] DOB: 1966/06/29 DOA: 01/13/2021 PCP: [name], MD      Brief Narrative:  Sean [name] is a 55 y.o. M with metastatic melanoma, metastatic to brain who presented with being found unresponsive in his wheelchair.          Assessment & Plan:  Cognitive impairment due to brain metastasis  -Continue Seroquel - Continue p.o. Haldol as needed if agitated -P.o. oxycodone or IV Dilaudid for headache    Metastatic melanoma   -Continue Keppra, prednisone   Severe protein calorie malnutrition due to cancer   Acute kidney injury Creatinine continues to trend up.  Patient unable to keep up with fluid intake needs by mouth, have discussed IV fluids with family, patient; patient wishes to let nature take it's course, and move to [place] - I suspect life expectancy is <14 days, would recommend residential hospice if available        Disposition: Status is: Inpatient  Remains inpatient appropriate because:Unsafe d/c plan  His renal function is worsening, mentation is not improving.  At this point I think it is increasingly likely that he has a prognosis less than 2 weeks.       Dispo: The patient is from: SNF              Anticipated d/c is to: SNF              Patient currently is medically stable to d/c.   Difficult to place patient Yes       Level of care: Med-Surg       MDM: The below labs and imaging reports were reviewed and summarized above.  Medication management as above.       DVT prophylaxis: SCDs Start: 01/14/21 0404  Code Status: FULL Family Communication: Wife by phone, brother, sister, father at the bedside            Subjective: More alert with family around, ambulating in the hall, wishes to go outside.  No seizures, no vomiting.  Drinks occasionally but poor p.o. intake.      Objective: Vitals:    01/27/21 1259 01/27/21 2136 01/28/21 0444 01/28/21 1349  BP: 105/66 99/72 94/65  96/65  Pulse: 99 87 87 90  Resp: 16 20 17 15   Temp: 98.2 F (36.8 C) 98.6 F (37 C) (!) 97.5 F (36.4 C) 98.5 F (36.9 C)  TempSrc: Oral Oral    SpO2: 97% 100% 96% 96%  Weight:   69.7 kg   Height:        Intake/Output Summary (Last 24 hours) at 01/28/2021 1500 Last data filed at 01/28/2021 1300 Gross per 24 hour  Intake 360 ml  Output 450 ml  Net -90 ml   Filed Weights   01/13/21 2212 01/27/21 0500 01/28/21 0444  Weight: 77.1 kg 68.7 kg 69.7 kg    Examination: General appearance: Thin adult male, sitting up in bed, appears debilitated     HEENT:    Skin:  Cardiac: RRR, no murmurs, no lower extremity edema Respiratory: Respiratory effort normal, no rales or wheezes Abdomen: Abdomen soft without tenderness palpation or guarding, no ascites or distention MSK:  Neuro:    Psych: Attention diminished, psychomotor slowing noted, affect blunted.  He is able to answer some questions, but difficult to consistently attend our conversation and nuances of treatment            Data  Reviewed: I have personally reviewed following labs and imaging studies:  CBC: Recent Labs  Lab 01/22/21 0458 01/23/21 0534 01/24/21 0502  WBC 7.2 6.9 6.0  NEUTROABS 5.6 4.8 4.0  HGB 12.1* 11.8* 11.0*  HCT 35.4* 35.7* 32.8*  MCV 89.4 92.0 89.9  PLT 249 236 737   Basic Metabolic Panel: Recent Labs  Lab 01/22/21 0458 01/23/21 0534 01/24/21 0502 01/26/21 0548 01/27/21 0541 01/28/21 0452  NA 139 139 138 137 138 138  K 4.1 3.9 3.9 4.1 4.6 4.3  CL 107 107 107 105 105 104  CO2 25 24 24 23 22 24   GLUCOSE 81 81 86 85 89 87  BUN 12 14 17  23* 26* 27*  CREATININE 1.02 1.32* 1.65* 2.83* 3.17* 3.41*  CALCIUM 8.5* 8.8* 8.7* 8.9 9.0 9.1  MG 1.9 1.7 1.7  --   --   --   PHOS 3.5 3.6 4.3  --   --   --    GFR: Estimated Creatinine Clearance: 23.8 mL/min (A) (by C-G formula based on SCr of 3.41 mg/dL  (H)). Liver Function Tests: Recent Labs  Lab 01/22/21 0458 01/23/21 0534 01/24/21 0502  AST 30 21 16   ALT 38 32 25  ALKPHOS 74 74 71  BILITOT 0.8 0.8 0.8  PROT 5.6* 5.5* 5.3*  ALBUMIN 2.9* 3.1* 2.9*   No results for input(s): LIPASE, AMYLASE in the last 168 hours. No results for input(s): AMMONIA in the last 168 hours. Coagulation Profile: No results for input(s): INR, PROTIME in the last 168 hours. Cardiac Enzymes: No results for input(s): CKTOTAL, CKMB, CKMBINDEX, TROPONINI in the last 168 hours. BNP (last 3 results) No results for input(s): PROBNP in the last 8760 hours. HbA1C: No results for input(s): HGBA1C in the last 72 hours. CBG: Recent Labs  Lab 01/26/21 2219 01/27/21 0527 01/27/21 1157 01/28/21 0044 01/28/21 0615  GLUCAP 158* 123* 92 108* 93   Lipid Profile: No results for input(s): CHOL, HDL, LDLCALC, TRIG, CHOLHDL, LDLDIRECT in the last 72 hours. Thyroid Function Tests: No results for input(s): TSH, T4TOTAL, FREET4, T3FREE, THYROIDAB in the last 72 hours. Anemia Panel: No results for input(s): VITAMINB12, FOLATE, FERRITIN, TIBC, IRON, RETICCTPCT in the last 72 hours. Urine analysis:    Component Value Date/Time   COLORURINE YELLOW 01/13/2021 2154   APPEARANCEUR CLEAR 01/13/2021 2154   LABSPEC 1.011 01/13/2021 2154   PHURINE 5.0 01/13/2021 2154   GLUCOSEU NEGATIVE 01/13/2021 2154   HGBUR NEGATIVE 01/13/2021 2154   BILIRUBINUR NEGATIVE 01/13/2021 2154   KETONESUR 20 (A) 01/13/2021 2154   PROTEINUR NEGATIVE 01/13/2021 2154   NITRITE NEGATIVE 01/13/2021 2154   LEUKOCYTESUR NEGATIVE 01/13/2021 2154   Sepsis Labs: @LABRCNTIP (procalcitonin:4,lacticacidven:4)  ) No results found for this or any previous visit (from the past 240 hour(s)).       Radiology Studies: No results found.      Scheduled Meds: . (feeding supplement) PROSource Plus  30 mL Oral BID BM  . Chlorhexidine Gluconate Cloth  6 each Topical Daily  . feeding supplement  237  mL Oral TID BM  . levETIRAcetam  500 mg Oral BID  . mouth rinse  15 mL Mouth Rinse BID  . predniSONE  10 mg Oral Q breakfast  . QUEtiapine  100 mg Oral BID  . senna-docusate  1 tablet Oral QHS  . traZODone  25 mg Oral QHS   Continuous Infusions:   LOS: 14 days    Time spent: 25 minutes    Edwin Dada, MD [facility] 01/28/2021, 3:00 PM     Please page though [place] or Epic secure chat:  For [employer], Adult nurse

## 2021-01-29 DIAGNOSIS — E43 Unspecified severe protein-calorie malnutrition: Secondary | ICD-10-CM

## 2021-01-29 DIAGNOSIS — Z515 Encounter for palliative care: Secondary | ICD-10-CM

## 2021-01-29 LAB — RESP PANEL BY RT-PCR (FLU A&B, COVID) ARPGX2
Influenza A by PCR: NEGATIVE
Influenza B by PCR: NEGATIVE
SARS Coronavirus 2 by RT PCR: NEGATIVE

## 2021-01-29 LAB — GLUCOSE, CAPILLARY: Glucose-Capillary: 113 mg/dL — ABNORMAL HIGH (ref 70–99)

## 2021-01-29 MED ORDER — TRAZODONE HCL 50 MG PO TABS: 25.0000 mg | ORAL_TABLET | Freq: Every day | ORAL | Status: AC

## 2021-01-29 MED ORDER — PREDNISONE 10 MG PO TABS: 10.0000 mg | ORAL_TABLET | Freq: Every day | ORAL | Status: AC

## 2021-01-29 MED ORDER — QUETIAPINE FUMARATE 100 MG PO TABS: 100.0000 mg | ORAL_TABLET | Freq: Two times a day (BID) | ORAL | Status: AC

## 2021-01-29 MED ORDER — PROSOURCE PLUS PO LIQD: 30.0000 mL | Freq: Two times a day (BID) | ORAL | Status: AC

## 2021-01-29 MED ORDER — HALOPERIDOL 2 MG PO TABS: 2.0000 mg | ORAL_TABLET | ORAL | Status: AC | PRN

## 2021-01-29 MED ORDER — ENSURE ENLIVE PO LIQD: 237.0000 mL | Freq: Three times a day (TID) | ORAL | 12 refills | Status: AC

## 2021-01-29 MED ORDER — ACETAMINOPHEN 325 MG PO TABS: 650.0000 mg | ORAL_TABLET | Freq: Four times a day (QID) | ORAL | Status: AC | PRN

## 2021-01-29 MED ORDER — OXYCODONE HCL 5 MG PO TABS: 5.0000 mg | ORAL_TABLET | ORAL | 0 refills | Status: AC | PRN

## 2021-01-29 MED ORDER — LEVETIRACETAM 100 MG/ML PO SOLN
500.0000 mg | Freq: Two times a day (BID) | ORAL | 12 refills | Status: AC
Start: 2021-01-29 — End: ?

## 2021-01-29 MED ORDER — SENNOSIDES-DOCUSATE SODIUM 8.6-50 MG PO TABS: 1.0000 | ORAL_TABLET | Freq: Every day | ORAL | Status: AC

## 2021-01-29 NOTE — Discharge Summary (Signed)
Physician Discharge Summary  Tremain Rucinski DJT:[id number] DOB: [birth year] DOA: 01/13/2021  PCP: [name], MD  Admit date: 01/13/2021 Discharge date: 01/29/2021  Admitted From: Home  Disposition:  Residential Hospice         Brief/Interim Summary: Mr. Weckerly is a 55 y.o. M with metastatic melanoma, metastatic to brain who presented with being found unresponsive in his wheelchair.  Diagnosed with metastatic melanoma few months ago,metastatic to brain, liver and lungs, unknown primary, underwent radiation and PDL1 inhib.    Developed AIN from [place] as well as some hemorrhage of brain mets; PDL1/niv/ip therapy held.  Mentation worsened and patient had transitioned to [place] for rehab.  On the day of admission, found unresponsive there.  Brought to ER, where he was febrile, to 103F, tachycardic, WBC 17, Cr 2.7.  CXR with low volumes, presumably pneumonia.  Started on antibiotics and admitted.          PRINCIPAL HOSPITAL DIAGNOSIS: Sepsis with encephalopathy due to pneumonia due to metastatic melanoma    Discharge Diagnoses:   Acute metabolic encephalopathy due to sepsis resolved to chronic cognitive impairment due to progressive brain metastases Sepsis, likely due to pneumonia Admitted with leukocytosis, fever, tachycardia.  Workup suggested infection in lung.  Treated with cefepime/Flagyl for 7 days, sepsis physiology resolved.  At baseline is interactive, oriented to family, able to follow commands and pleasant but forgetful of month/year.  Here, presented confused and somnolent due to sepsis.  After resolution of sepsis he remained confused, withdrawn, and less interactive than previous as well as impulsive and at times disoriented.    This did not resolve with fluids, correction of electrolytes, steroids, or resolution of sepsis, and there is a high degree of certainty that this is from his progressive brain mets.  Chemo was judged by Oncology to  be of more likely harm than benefit.  Hospice were consulted, and when his renal function started to worsen again, residential hospice was arranged.      Melanoma metastatic to brain and liver and lungs Oncology were consulted, treatment was considered but given his worsening mentation, worsening functional status and worsening renal function, this was thought more harmful than beneficial.  Full supportive measures provided.    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  He was treated with fluids and this resolved.  However, subsequent to stopping fluids, his renal function worsened again.  Given this immediate worsening, suspect possible DI.  Workup was considered, but it is my feeling that patient should be spared this, as work up would not provide meaningful chance of functional recovery and would prolong the patient in a dependent and suffering state.    Hypomagnesemia Hypokalemia Repleted and corrected.  Anemia of chronic disease          Discharge Instructions   Allergies as of 01/29/2021   No Known Allergies     Medication List    STOP taking these medications   citalopram 10 MG tablet Commonly known as: CELEXA   levETIRAcetam 500 MG tablet Commonly known as: KEPPRA Replaced by: levETIRAcetam 100 MG/ML solution   LORazepam 1 MG tablet Commonly known as: Ativan   melatonin 5 MG Tabs   multivitamin with minerals Tabs tablet   pantoprazole 40 MG tablet Commonly known as: PROTONIX     TAKE these medications   acetaminophen 325 MG tablet Commonly known as: TYLENOL Take 2 tablets (650 mg total) by mouth every 6 (six) hours as  needed for mild pain (or Fever >/= 101).   feeding supplement Liqd Take 237 mLs by mouth 3 (three) times daily between meals.   (feeding supplement) PROSource Plus liquid Take 30 mLs by mouth 2 (two) times daily between meals.   haloperidol 2 MG tablet Commonly  known as: HALDOL Take 1 tablet (2 mg total) by mouth every 4 (four) hours as needed for agitation (or delirium).   levETIRAcetam 100 MG/ML solution Commonly known as: KEPPRA Take 5 mLs (500 mg total) by mouth 2 (two) times daily. Replaces: levETIRAcetam 500 MG tablet   oxyCODONE 5 MG immediate release tablet Commonly known as: Oxy IR/ROXICODONE Take 1 tablet (5 mg total) by mouth every 4 (four) hours as needed for severe pain or moderate pain.   predniSONE 10 MG tablet Commonly known as: DELTASONE Take 1 tablet (10 mg total) by mouth daily with breakfast. Start taking on: Jan 30, 2021 What changed:   medication strength  how much to take   QUEtiapine 100 MG tablet Commonly known as: SEROQUEL Take 1 tablet (100 mg total) by mouth 2 (two) times daily.   senna-docusate 8.6-50 MG tablet Commonly known as: Senokot-S Take 1 tablet by mouth at bedtime. What changed: how much to take   traZODone 50 MG tablet Commonly known as: DESYREL Take 0.5 tablets (25 mg total) by mouth at bedtime.       No Known Allergies  Consultations:  Oncology   Procedures/Studies: CT Head Wo Contrast  Result Date: 01/13/2021 CLINICAL DATA:  Initial evaluation for acute delirium. EXAM: CT HEAD WITHOUT CONTRAST TECHNIQUE: Contiguous axial images were obtained from the base of the skull through the vertex without intravenous contrast. COMPARISON:  Recent CT from 12/18/2020. FINDINGS: Brain: Again seen are multiple hemorrhagic metastases scattered throughout both cerebral hemispheres. Overall, lesions are not appreciably changed or progressed as compared to most recent CT. Most prominent of these lesions positioned at the left parietal lobe in measures approximately 1.8 cm. Localized vasogenic edema about the preponderance of these lesions without midline shift or significant regional mass effect. No evidence for new/interval hemorrhage by CT. No appreciable infratentorial involvement. No acute large  vessel territory infarct. Ventricles stable in size without hydrocephalus. No extra-axial fluid collection. Vascular: No hyperdense vessel. Skull: Scalp soft tissues demonstrate no acute finding. Post craniotomy [name] is noted at the left calvarium. Sinuses/Orbits: Globes and orbital soft tissues demonstrate no acute finding. Visualized paranasal sinuses are clear. No mastoid effusion. Other: None. IMPRESSION: 1. No significant interval change in size and appearance of multiple hemorrhagic metastases scattered throughout both cerebral hemispheres, with similar localized vasogenic edema. No midline shift or significant regional mass effect. 2. No other acute intracranial abnormality. Electronically Signed   By: Jeannine Boga M.D.   On: 01/13/2021 23:16   DG CHEST PORT 1 VIEW  Result Date: 01/22/2021 CLINICAL DATA:  Inpatient, concern for aspiration, confusion EXAM: PORTABLE CHEST 1 VIEW COMPARISON:  01/13/2021 chest radiograph. FINDINGS: Stable cardiomediastinal silhouette with normal heart size. No pneumothorax. No pleural effusion. Lungs appear clear, with no acute consolidative airspace disease and no pulmonary edema. IMPRESSION: No active disease. Electronically Signed   By: Ilona Sorrel M.D.   On: 01/22/2021 11:07   DG Chest Port 1 View  Result Date: 01/13/2021 CLINICAL DATA:  Unresponsive EXAM: PORTABLE CHEST 1 VIEW COMPARISON:  12/18/2020 FINDINGS: The heart size and mediastinal contours are within normal limits. Both lungs are clear. The visualized skeletal structures are unremarkable. The lung volumes are low. These low  lung volumes likely accentuate the upper mediastinum. IMPRESSION: Low lung volumes.  Otherwise, stable exam. Electronically Signed   By: Constance Holster M.D.   On: 01/13/2021 23:05   VAS [place] UPPER EXTREMITY VENOUS DUPLEX  Result Date: 01/21/2021 UPPER VENOUS STUDY  Patient Name:  AMUN STEMM  Date of Exam:   01/20/2021 Medical Rec #: [id number]      Accession #:     [id number] Date of Birth: 1966/03/29     Patient Gender: M Patient Age:   54Y Exam Location:  [facility] Procedure:      VAS [place] UPPER EXTREMITY VENOUS DUPLEX Referring Phys: [id number] A CALDWELL POWELL JR --------------------------------------------------------------------------------  Indications: Swelling, and s/p IV Limitations: Poor ultrasound/tissue interface. Comparison Study: No prior studies. Performing Technologist: Darlin Coco RDMS,RVT  Examination Guidelines: A complete evaluation includes B-mode imaging, spectral Doppler, color Doppler, and power Doppler as needed of all accessible portions of each vessel. Bilateral testing is considered an integral part of a complete examination. Limited examinations for reoccurring indications may be performed as noted.  Right Findings: +----------+------------+---------+-----------+----------+-------+ RIGHT     CompressiblePhasicitySpontaneousPropertiesSummary +----------+------------+---------+-----------+----------+-------+ Subclavian               Yes       Yes                      +----------+------------+---------+-----------+----------+-------+  Left Findings: +----------+------------+---------+-----------+----------+-----------------+ LEFT      CompressiblePhasicitySpontaneousProperties     Summary      +----------+------------+---------+-----------+----------+-----------------+ IJV           Full       Yes       Yes                                +----------+------------+---------+-----------+----------+-----------------+ Subclavian    Full       Yes       Yes                                +----------+------------+---------+-----------+----------+-----------------+ Axillary      Full       Yes       Yes                                +----------+------------+---------+-----------+----------+-----------------+ Brachial      Full                                                     +----------+------------+---------+-----------+----------+-----------------+ Radial        Full                                                    +----------+------------+---------+-----------+----------+-----------------+ Ulnar         Full                                                    +----------+------------+---------+-----------+----------+-----------------+  Cephalic    Partial                                 Age Indeterminate +----------+------------+---------+-----------+----------+-----------------+ Basilic                                              Not visualized   +----------+------------+---------+-----------+----------+-----------------+  Summary:  Right: No evidence of thrombosis in the subclavian.  Left: No evidence of deep vein thrombosis in the upper extremity. Findings consistent with age indeterminate superficial vein thrombosis involving the left cephalic vein. Unable to visualize the basilic vein.  *See table(s) above for measurements and observations.  Diagnosing physician: Ruta Hinds MD Electronically signed by Ruta Hinds MD on 01/21/2021 at 4:17:44 PM.    Final        Subjective: Somewhat impulsive.  No fever.  No vomiting.  More sleepy.  Discharge Exam: Vitals:   01/28/21 2030 01/29/21 0556  BP: 94/60 110/76  Pulse: 82 78  Resp: 15 17  Temp: 98.2 F (36.8 C) (!) 97.5 F (36.4 C)  SpO2: 97% 99%   Vitals:   01/28/21 0444 01/28/21 1349 01/28/21 2030 01/29/21 0556  BP: 94/65 96/65 94/60  110/76  Pulse: 87 90 82 78  Resp: 17 15 15 17   Temp: (!) 97.5 F (36.4 C) 98.5 F (36.9 C) 98.2 F (36.8 C) (!) 97.5 F (36.4 C)  TempSrc:   Oral   SpO2: 96% 96% 97% 99%  Weight: 69.7 kg   69.2 kg  Height:        General: Pt is sleepy, but rouses, not in acute distress Cardiovascular: RRR, nl S1-S2, no murmurs appreciated.   No LE edema.   Respiratory: Normal respiratory rate and rhythm.  CTAB without rales or wheezes. Abdominal:  Abdomen soft and non-tender.  No distension or HSM.   Neuro/Psych: Strength symmetric in upper and lower extremities but generally weak.  Judgment and insight appear impaired.   The results of significant diagnostics from this hospitalization (including imaging, microbiology, ancillary and laboratory) are listed below for reference.     Microbiology: Recent Results (from the past 240 hour(s))  Resp Panel by RT-PCR (Flu A&B, Covid) Nasopharyngeal Swab     Status: None   Collection Time: 01/29/21  9:53 AM   Specimen: Nasopharyngeal Swab; Nasopharyngeal(NP) swabs in vial transport medium  Result Value Ref Range Status   SARS Coronavirus 2 by RT PCR NEGATIVE NEGATIVE Final    Comment: (NOTE) SARS-CoV-2 target nucleic acids are NOT DETECTED.  The SARS-CoV-2 RNA is generally detectable in upper respiratory specimens during the acute phase of infection. The lowest concentration of SARS-CoV-2 viral copies this assay can detect is 138 copies/mL. A negative result does not preclude SARS-Cov-2 infection and should not be used as the sole basis for treatment or other patient management decisions. A negative result may occur with  improper specimen collection/handling, submission of specimen other than nasopharyngeal swab, presence of viral mutation(s) within the areas targeted by this assay, and inadequate number of viral copies(<138 copies/mL). A negative result must be combined with clinical observations, patient history, and epidemiological information. The expected result is Negative.  Fact Sheet for Patients:  EntrepreneurPulse.com.au  Fact Sheet for Healthcare Providers:  IncredibleEmployment.be  This test is no t yet approved or cleared by the [place] FDA  and  has been authorized for detection and/or diagnosis of SARS-CoV-2 by FDA under an Emergency Use Authorization (EUA). This EUA will remain  in effect (meaning this test can be used) for  the duration of the COVID-19 declaration under Section 564(b)(1) of the Act, 21 U.S.C.section 360bbb-3(b)(1), unless the authorization is terminated  or revoked sooner.       Influenza A by PCR NEGATIVE NEGATIVE Final   Influenza B by PCR NEGATIVE NEGATIVE Final    Comment: (NOTE) The Xpert Xpress SARS-CoV-2/FLU/RSV plus assay is intended as an aid in the diagnosis of influenza from Nasopharyngeal swab specimens and should not be used as a sole basis for treatment. Nasal washings and aspirates are unacceptable for Xpert Xpress SARS-CoV-2/FLU/RSV testing.  Fact Sheet for Patients: EntrepreneurPulse.com.au  Fact Sheet for Healthcare Providers: IncredibleEmployment.be  This test is not yet approved or cleared by the [place] FDA and has been authorized for detection and/or diagnosis of SARS-CoV-2 by FDA under an Emergency Use Authorization (EUA). This EUA will remain in effect (meaning this test can be used) for the duration of the COVID-19 declaration under Section 564(b)(1) of the Act, 21 U.S.C. section 360bbb-3(b)(1), unless the authorization is terminated or revoked.  Performed at [facility], [place] [address], [place], [place] [address]      Labs: BNP (last 3 results) No results for input(s): BNP in the last 8760 hours. Basic Metabolic Panel: Recent Labs  Lab 01/23/21 0534 01/24/21 0502 01/26/21 0548 01/27/21 0541 01/28/21 0452  NA 139 138 137 138 138  K 3.9 3.9 4.1 4.6 4.3  CL 107 107 105 105 104  CO2 24 24 23 22 24   GLUCOSE 81 86 85 89 87  BUN 14 17 23* 26* 27*  CREATININE 1.32* 1.65* 2.83* 3.17* 3.41*  CALCIUM 8.8* 8.7* 8.9 9.0 9.1  MG 1.7 1.7  --   --   --   PHOS 3.6 4.3  --   --   --    Liver Function Tests: Recent Labs  Lab 01/23/21 0534 01/24/21 0502  AST 21 16  ALT 32 25  ALKPHOS 74 71  BILITOT 0.8 0.8  PROT 5.5* 5.3*  ALBUMIN 3.1* 2.9*   No results for input(s): LIPASE, AMYLASE in  the last 168 hours. No results for input(s): AMMONIA in the last 168 hours. CBC: Recent Labs  Lab 01/23/21 0534 01/24/21 0502  WBC 6.9 6.0  NEUTROABS 4.8 4.0  HGB 11.8* 11.0*  HCT 35.7* 32.8*  MCV 92.0 89.9  PLT 236 228   Cardiac Enzymes: No results for input(s): CKTOTAL, CKMB, CKMBINDEX, TROPONINI in the last 168 hours. BNP: Invalid input(s): POCBNP CBG: Recent Labs  Lab 01/27/21 0527 01/27/21 1157 01/28/21 0044 01/28/21 0615 01/29/21 1148  GLUCAP 123* 92 108* 93 113*   D-Dimer No results for input(s): DDIMER in the last 72 hours. Hgb A1c No results for input(s): HGBA1C in the last 72 hours. Lipid Profile No results for input(s): CHOL, HDL, LDLCALC, TRIG, CHOLHDL, LDLDIRECT in the last 72 hours. Thyroid function studies No results for input(s): TSH, T4TOTAL, T3FREE, THYROIDAB in the last 72 hours.  Invalid input(s): FREET3 Anemia work up No results for input(s): VITAMINB12, FOLATE, FERRITIN, TIBC, IRON, RETICCTPCT in the last 72 hours. Urinalysis    Component Value Date/Time   COLORURINE YELLOW 01/13/2021 2154   APPEARANCEUR CLEAR 01/13/2021 2154   LABSPEC 1.011 01/13/2021 2154   PHURINE 5.0 01/13/2021 2154   GLUCOSEU NEGATIVE 01/13/2021 2154   HGBUR NEGATIVE 01/13/2021  2154   BILIRUBINUR NEGATIVE 01/13/2021 2154   KETONESUR 20 (A) 01/13/2021 2154   PROTEINUR NEGATIVE 01/13/2021 2154   NITRITE NEGATIVE 01/13/2021 2154   LEUKOCYTESUR NEGATIVE 01/13/2021 2154   Sepsis Labs Invalid input(s): PROCALCITONIN,  WBC,  LACTICIDVEN Microbiology Recent Results (from the past 240 hour(s))  Resp Panel by RT-PCR (Flu A&B, Covid) Nasopharyngeal Swab     Status: None   Collection Time: 01/29/21  9:53 AM   Specimen: Nasopharyngeal Swab; Nasopharyngeal(NP) swabs in vial transport medium  Result Value Ref Range Status   SARS Coronavirus 2 by RT PCR NEGATIVE NEGATIVE Final    Comment: (NOTE) SARS-CoV-2 target nucleic acids are NOT DETECTED.  The SARS-CoV-2 RNA is  generally detectable in upper respiratory specimens during the acute phase of infection. The lowest concentration of SARS-CoV-2 viral copies this assay can detect is 138 copies/mL. A negative result does not preclude SARS-Cov-2 infection and should not be used as the sole basis for treatment or other patient management decisions. A negative result may occur with  improper specimen collection/handling, submission of specimen other than nasopharyngeal swab, presence of viral mutation(s) within the areas targeted by this assay, and inadequate number of viral copies(<138 copies/mL). A negative result must be combined with clinical observations, patient history, and epidemiological information. The expected result is Negative.  Fact Sheet for Patients:  EntrepreneurPulse.com.au  Fact Sheet for Healthcare Providers:  IncredibleEmployment.be  This test is no t yet approved or cleared by the [place] FDA and  has been authorized for detection and/or diagnosis of SARS-CoV-2 by FDA under an Emergency Use Authorization (EUA). This EUA will remain  in effect (meaning this test can be used) for the duration of the COVID-19 declaration under Section 564(b)(1) of the Act, 21 U.S.C.section 360bbb-3(b)(1), unless the authorization is terminated  or revoked sooner.       Influenza A by PCR NEGATIVE NEGATIVE Final   Influenza B by PCR NEGATIVE NEGATIVE Final    Comment: (NOTE) The Xpert Xpress SARS-CoV-2/FLU/RSV plus assay is intended as an aid in the diagnosis of influenza from Nasopharyngeal swab specimens and should not be used as a sole basis for treatment. Nasal washings and aspirates are unacceptable for Xpert Xpress SARS-CoV-2/FLU/RSV testing.  Fact Sheet for Patients: EntrepreneurPulse.com.au  Fact Sheet for Healthcare Providers: IncredibleEmployment.be  This test is not yet approved or cleared by the [place] FDA and has been authorized for detection and/or diagnosis of SARS-CoV-2 by FDA under an Emergency Use Authorization (EUA). This EUA will remain in effect (meaning this test can be used) for the duration of the COVID-19 declaration under Section 564(b)(1) of the Act, 21 U.S.C. section 360bbb-3(b)(1), unless the authorization is terminated or revoked.  Performed at [facility], [place] [address], [place], [place] [address]      Time coordinating discharge: 35 minutes The [place] controlled substances registry was not reviewed for this patient discharging to residential Hospice.    30 Day Unplanned Readmission Risk Score   Flowsheet Row ED to Hosp-Admission (Current) from 01/13/2021 in [facility] [facility]  30 Day Unplanned Readmission Risk Score (%) 48.84 Filed at 01/29/2021 1200     This score is the patient's risk of an unplanned readmission within 30 days of being discharged (0 -100%). The score is based on dignosis, age, lab data, medications, orders, and past utilization.   Low:  0-14.9   Medium: 15-21.9   High: 22-29.9   Extreme: 30 and above  SIGNED:   Edwin Dada, MD  [facility] 01/29/2021, 12:43 PM

## 2021-01-29 NOTE — Progress Notes (Signed)
Transport here to take pt to [facility] at this time. Pts mother and brother at bedside, packed all personal belongings up and sent with them. IV left in place per hospice note. Pt voiced no complaints upon discharge.

## 2021-01-29 NOTE — Progress Notes (Addendum)
Manufacturing engineer [facility])  [facility] has a bed for [name] today.  Spoke with his wife [name] to confirm still interested and discuss necessary consents that have to be completed first.  RN staff, you may call report to [phone] at any time. Room is assigned when report is called.  Please leave IV access in place.  Once dc summary is completed, please fax to [phone].  [facility] will notify [facility] manager once transport can be arranged.  Thank you, Venia Carbon RN, BSN, [facility] Liaison   **addendum, 335 pm, all necessary consents are completed and transport to [facility] can occur. TOC manager updated.

## 2021-01-29 NOTE — TOC Transition Note (Signed)
Transition of Care [facility]) - CM/SW Discharge Note   Patient Details  Name: Sean [name] MRN: [id number] Date of Birth: 05-May-1966  [facility] CM/SW Contact:  Ross Ludwig, LCSW Phone Number: 01/29/2021, 11:23 AM   Clinical Narrative:    CSW received phone call that patient has a bed available at [name].  Patient's family will be completing paperwork.  Once paperwork completed patient can discharge.  Patient to be d/c'ed today to [facility].  Patient and family agreeable to plans will transport via ems RN to call report [phone].  Patient's family is aware of discharge today.      Final next level of care: [place] Barriers to Discharge: Barriers Resolved   Patient Goals and CMS Choice Patient states their goals for this hospitalization and ongoing recovery are:: To go to [facility] for End of Life Care. CMS Medicare.gov Compare Post Acute Care list provided to:: Patient Represenative (must comment) Choice offered to / list presented to : Spouse  Discharge Placement  [place] for end of life care.              Patient to be transferred to facility by: [facility] Name of family member notified: [name] patient's wife Patient and family notified of of transfer: 01/29/21  Discharge Plan and Services   Discharge Planning Services: CM Consult                                 Social Determinants of Health (SDOH) Interventions     Readmission Risk Interventions No flowsheet data found.

## 2021-01-29 NOTE — Progress Notes (Signed)
Called report to [facility] at this time. Printed out discharge instructions and added them to packet to be sent with pt.

## 2021-02-14 DEATH — deceased

## 2021-02-19 ENCOUNTER — Other Ambulatory Visit (HOSPITAL_COMMUNITY): Payer: Self-pay
